# Patient Record
Sex: Male | Born: 1950 | Race: White | Hispanic: No | Marital: Married | Smoking: Current every day smoker
Health system: Southern US, Community
[De-identification: ages and names within clinical notes are randomized; demographics above are authoritative.]

## PROBLEM LIST (undated history)

## (undated) DIAGNOSIS — G8929 Other chronic pain: Secondary | ICD-10-CM

## (undated) DIAGNOSIS — I739 Peripheral vascular disease, unspecified: Secondary | ICD-10-CM

## (undated) DIAGNOSIS — G629 Polyneuropathy, unspecified: Secondary | ICD-10-CM

## (undated) DIAGNOSIS — E782 Mixed hyperlipidemia: Secondary | ICD-10-CM

## (undated) DIAGNOSIS — I420 Dilated cardiomyopathy: Secondary | ICD-10-CM

## (undated) DIAGNOSIS — N184 Chronic kidney disease, stage 4 (severe): Secondary | ICD-10-CM

## (undated) DIAGNOSIS — I2510 Atherosclerotic heart disease of native coronary artery without angina pectoris: Secondary | ICD-10-CM

## (undated) DIAGNOSIS — I5030 Unspecified diastolic (congestive) heart failure: Secondary | ICD-10-CM

## (undated) DIAGNOSIS — Z7729 Contact with and (suspected ) exposure to other hazardous substances: Secondary | ICD-10-CM

## (undated) DIAGNOSIS — R29898 Other symptoms and signs involving the musculoskeletal system: Secondary | ICD-10-CM

## (undated) DIAGNOSIS — K8020 Calculus of gallbladder without cholecystitis without obstruction: Secondary | ICD-10-CM

## (undated) DIAGNOSIS — I509 Heart failure, unspecified: Secondary | ICD-10-CM

## (undated) DIAGNOSIS — M51369 Other intervertebral disc degeneration, lumbar region without mention of lumbar back pain or lower extremity pain: Secondary | ICD-10-CM

## (undated) DIAGNOSIS — M1990 Unspecified osteoarthritis, unspecified site: Secondary | ICD-10-CM

## (undated) DIAGNOSIS — E785 Hyperlipidemia, unspecified: Secondary | ICD-10-CM

## (undated) DIAGNOSIS — K269 Duodenal ulcer, unspecified as acute or chronic, without hemorrhage or perforation: Secondary | ICD-10-CM

## (undated) DIAGNOSIS — M549 Dorsalgia, unspecified: Secondary | ICD-10-CM

## (undated) DIAGNOSIS — I272 Pulmonary hypertension, unspecified: Secondary | ICD-10-CM

## (undated) DIAGNOSIS — Z7901 Long term (current) use of anticoagulants: Secondary | ICD-10-CM

## (undated) DIAGNOSIS — T8859XA Other complications of anesthesia, initial encounter: Secondary | ICD-10-CM

## (undated) DIAGNOSIS — Z972 Presence of dental prosthetic device (complete) (partial): Secondary | ICD-10-CM

## (undated) DIAGNOSIS — N189 Chronic kidney disease, unspecified: Secondary | ICD-10-CM

## (undated) DIAGNOSIS — K219 Gastro-esophageal reflux disease without esophagitis: Secondary | ICD-10-CM

## (undated) DIAGNOSIS — I6789 Other cerebrovascular disease: Secondary | ICD-10-CM

## (undated) DIAGNOSIS — J449 Chronic obstructive pulmonary disease, unspecified: Secondary | ICD-10-CM

## (undated) DIAGNOSIS — M5136 Other intervertebral disc degeneration, lumbar region: Secondary | ICD-10-CM

## (undated) DIAGNOSIS — I10 Essential (primary) hypertension: Secondary | ICD-10-CM

## (undated) DIAGNOSIS — M519 Unspecified thoracic, thoracolumbar and lumbosacral intervertebral disc disorder: Secondary | ICD-10-CM

## (undated) DIAGNOSIS — I700 Atherosclerosis of aorta: Secondary | ICD-10-CM

## (undated) DIAGNOSIS — N186 End stage renal disease: Secondary | ICD-10-CM

## (undated) DIAGNOSIS — I639 Cerebral infarction, unspecified: Secondary | ICD-10-CM

## (undated) DIAGNOSIS — I6523 Occlusion and stenosis of bilateral carotid arteries: Secondary | ICD-10-CM

## (undated) DIAGNOSIS — K08109 Complete loss of teeth, unspecified cause, unspecified class: Secondary | ICD-10-CM

## (undated) HISTORY — DX: Atherosclerotic heart disease of native coronary artery without angina pectoris: I25.10

## (undated) HISTORY — DX: Heart failure, unspecified: I50.9

## (undated) HISTORY — DX: Chronic kidney disease, unspecified: N18.9

---

## 1985-03-18 HISTORY — PX: CERVICAL DISC SURGERY: SHX588

## 2003-03-19 HISTORY — PX: BACK SURGERY: SHX140

## 2003-07-19 ENCOUNTER — Ambulatory Visit (HOSPITAL_COMMUNITY): Admission: RE | Admit: 2003-07-19 | Discharge: 2003-07-20 | Payer: Self-pay | Admitting: Neurosurgery

## 2004-03-18 HISTORY — PX: LUMBAR LAMINECTOMY: SHX95

## 2009-03-23 ENCOUNTER — Encounter: Admission: RE | Admit: 2009-03-23 | Discharge: 2009-03-23 | Payer: Self-pay | Admitting: Neurosurgery

## 2009-04-06 ENCOUNTER — Encounter: Admission: RE | Admit: 2009-04-06 | Discharge: 2009-04-06 | Payer: Self-pay | Admitting: Neurosurgery

## 2009-10-26 ENCOUNTER — Encounter: Admission: RE | Admit: 2009-10-26 | Discharge: 2009-10-26 | Payer: Self-pay | Admitting: Neurosurgery

## 2010-06-05 ENCOUNTER — Other Ambulatory Visit: Payer: Self-pay | Admitting: Neurosurgery

## 2010-06-05 DIAGNOSIS — M5410 Radiculopathy, site unspecified: Secondary | ICD-10-CM

## 2010-06-05 DIAGNOSIS — M479 Spondylosis, unspecified: Secondary | ICD-10-CM

## 2010-06-06 ENCOUNTER — Ambulatory Visit
Admission: RE | Admit: 2010-06-06 | Discharge: 2010-06-06 | Disposition: A | Discharge status: Home / Self Care | Payer: Managed Care, Other (non HMO) | Source: Ambulatory Visit | Attending: Neurosurgery | Admitting: Neurosurgery

## 2010-06-06 DIAGNOSIS — M5410 Radiculopathy, site unspecified: Secondary | ICD-10-CM

## 2010-06-06 DIAGNOSIS — M479 Spondylosis, unspecified: Secondary | ICD-10-CM

## 2010-07-12 ENCOUNTER — Ambulatory Visit: Payer: Self-pay | Admitting: Internal Medicine

## 2010-08-03 NOTE — H&P (Signed)
NAME:  Gary Mccoy, Gary Mccoy                       ACCOUNT NO.:  [id number]   MEDICAL RECORD NO.:  [id number]                   PATIENT TYPE:  OIB   LOCATION:  NA                                   FACILITY:  [facility]   PHYSICIAN:  Leeroy Cha, M.D.                DATE OF BIRTH:  Mar 15, 1951   DATE OF ADMISSION:  07/19/2003  DATE OF DISCHARGE:                                HISTORY & PHYSICAL   HISTORY:  The patient is a gentleman who was seen by me in my office about  one week ago because of back pain with radiation down to the left leg, which  had been going on for seven months, which has been getting worse.  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  Also, walking is associated with a  cramp in the left leg.  He walks up to 200 feet, and then he has to stop  because he developed a cramping pain in the left leg which was unbearable.  He denies any problem with the right leg.  He is really worried, because  according to him, his profession requires a lot of skills, and now he feels  that it is getting dangerous for him to drive.  He had an MRI and was sent  to [place] for evaluation.   PAST MEDICAL/SURGICAL HISTORY:  Cervical diskectomy in 1987.   SOCIAL HISTORY:  He smokes two packs a day.  He drinks socially.   FAMILY HISTORY:  Unremarkable.   REVIEW OF SYSTEMS:  Positive for back and left leg pain.   MEDICATIONS:  Relafen.   PHYSICAL EXAMINATION:  GENERAL:  The patient came to my office limping from  the left leg.  The pain is unbearable.  NECK:  Normal.  LUNGS:  Clear.  HEART:  Sounds are normal.  ABDOMEN:  Normal.  EXTREMITIES:  Normal pulses.  NEUROLOGIC:  He has a scar in the posterior cervical area.  Mental status is  normal.  Cranial nerves  normal.  Strength is normal except for weakness of  dorsiflexion in the left foot at  3/5.  His face is symmetrical.  Straight  leg raising is positive bilaterally at 80 degrees.   The lumbar spine x-rays show positive arthropathy.  The MRI showed that he  has stenosis at the level of L4-5.  There is quite a bit of lateral recess  bilaterally, left worse than right one.  The canal at the level of L4-5 is  about 8 mm.   IMPRESSION:  Left L5 radiculopathy, secondary to stenosis.   RECOMMENDATIONS:  I talked to the patient at length.  We talked about  proceeding with the left side, but he is really concerned now that  because  of the right side being narrow, he is really worried having to have surgery  again.  At the end, we agreed to go ahead and do an L4-5 laminotomy and  foraminotomy on the left side, and for the same  procedure on the right side.  The patient knows about the risks, such as  infection, CSF leak, worsening of the pain, paralysis, need for further  surgery, no improvement whatsoever, or need for further surgical procedure,  which might require a fusion.                                                Leeroy Cha, M.D.    EB/MEDQ  D:  07/19/2003  T:  07/19/2003  Job:  [id number]

## 2010-08-03 NOTE — Op Note (Signed)
NAME:  Gary Mccoy, Gary Mccoy                       ACCOUNT NO.:  [id number]   MEDICAL RECORD NO.:  [id number]                   PATIENT TYPE:  OIB   LOCATION:  3172                                 FACILITY:  [facility]   PHYSICIAN:  Leeroy Cha, M.D.                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   POSTOPERATIVE DIAGNOSIS:  Lumbar stenosis with chronic left L5  radiculopathy.   PROCEDURE:  Bilateral L4-L5 laminotomy, bilateral foraminotomy,  decompression of the L4-L5 nerve root, microscope.   SURGEON:  Leeroy Cha, M.D.   ASSISTANT:  Otilio Connors, M.D.   CLINICAL HISTORY:  The patient was admitted because of back and left leg  pain.  The pain is worse with activity and cramp in the left leg.  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  He does not have  any pain in the right leg.  The MRI shows severe L4-L5 stenosis up to the  point that the canal is about 8 mm.  He has bilateral foraminal narrowing  with compromise of L5 nerve root, although he is only symptomatic on the  left side.  He did not want anymore conservative treatment and he wanted to  go ahead with surgery.  He was worried about the possibility of having the  symptoms on the right side and because of the size, we decided to proceed  bilaterally.  The risks were explained during the history and physical.   PROCEDURE:  The patient was taken to the OR and he was positioned in a prone  manner.  The back was prepped with Betadine.  A midline incision from L4 to  L5 was made.  The muscles were retracted laterally.  X-ray showed that,  indeed, we were at the level of L4-L5.  We started on the left side and with  the drill, we drilled away the lamina of 5-4, the upper of L5 and the thick  ligament.  Indeed, the canal was  quite narrow.  We went laterally removing  1/3 of the medial facet.  He has hypertrophy of the yellow ligament and all  of this was excised.  Finally, we were able to retract the L5 nerve root.  The patient had quite a bit of adhesions, lysing was accomplished.  Using  the straight as well as the curved, we did the foraminotomy.  The same  procedure was done similar on the right side with decompression of the right  L5 nerve root.  We investigated the disc space and it was slightly bulging  but there was no need to do any discectomy.  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  During the  procedure, the patient started coughing and we could see a small amount of  CSF coming through.  Because of that, we put a single stitch followed by  Tisseel.  Valsalva maneuver was negative.  The area was irrigated.  Fat was  left in the epidural space and the wound was closed with Vicryl and nylon.                                               Leeroy Cha, M.D.    EB/MEDQ  D:  07/19/2003  T:  07/19/2003  Job:  [id number]

## 2010-08-08 ENCOUNTER — Ambulatory Visit: Payer: Self-pay | Admitting: Neurology

## 2012-12-14 ENCOUNTER — Ambulatory Visit: Payer: Self-pay | Admitting: Gastroenterology

## 2012-12-14 HISTORY — PX: COLONOSCOPY: SHX174

## 2013-02-01 DIAGNOSIS — I10 Essential (primary) hypertension: Secondary | ICD-10-CM | POA: Insufficient documentation

## 2013-11-12 DIAGNOSIS — M5414 Radiculopathy, thoracic region: Secondary | ICD-10-CM | POA: Insufficient documentation

## 2013-11-12 DIAGNOSIS — M5137 Other intervertebral disc degeneration, lumbosacral region: Secondary | ICD-10-CM | POA: Insufficient documentation

## 2013-11-12 DIAGNOSIS — M5116 Intervertebral disc disorders with radiculopathy, lumbar region: Secondary | ICD-10-CM | POA: Insufficient documentation

## 2013-11-12 DIAGNOSIS — M5136 Other intervertebral disc degeneration, lumbar region: Secondary | ICD-10-CM | POA: Insufficient documentation

## 2014-01-27 DIAGNOSIS — Z7729 Contact with and (suspected ) exposure to other hazardous substances: Secondary | ICD-10-CM | POA: Insufficient documentation

## 2014-03-26 ENCOUNTER — Ambulatory Visit: Payer: Self-pay | Admitting: Physical Medicine and Rehabilitation

## 2015-08-28 ENCOUNTER — Emergency Department: Payer: Managed Care, Other (non HMO)

## 2015-08-28 ENCOUNTER — Emergency Department
Admission: EM | Admit: 2015-08-28 | Discharge: 2015-08-28 | Disposition: A | Discharge status: Home / Self Care | Payer: Managed Care, Other (non HMO) | Attending: Emergency Medicine | Admitting: Emergency Medicine

## 2015-08-28 DIAGNOSIS — R1011 Right upper quadrant pain: Secondary | ICD-10-CM

## 2015-08-28 DIAGNOSIS — M791 Myalgia: Secondary | ICD-10-CM | POA: Diagnosis not present

## 2015-08-28 DIAGNOSIS — F17210 Nicotine dependence, cigarettes, uncomplicated: Secondary | ICD-10-CM | POA: Insufficient documentation

## 2015-08-28 DIAGNOSIS — K269 Duodenal ulcer, unspecified as acute or chronic, without hemorrhage or perforation: Secondary | ICD-10-CM | POA: Insufficient documentation

## 2015-08-28 DIAGNOSIS — M7918 Myalgia, other site: Secondary | ICD-10-CM

## 2015-08-28 DIAGNOSIS — I7409 Other arterial embolism and thrombosis of abdominal aorta: Secondary | ICD-10-CM

## 2015-08-28 LAB — COMPREHENSIVE METABOLIC PANEL
ALK PHOS: 90 U/L (ref 38–126)
ALT: 38 U/L (ref 17–63)
AST: 30 U/L (ref 15–41)
Albumin: 4.1 g/dL (ref 3.5–5.0)
Anion gap: 9 (ref 5–15)
BUN: 17 mg/dL (ref 6–20)
CALCIUM: 9.6 mg/dL (ref 8.9–10.3)
CO2: 25 mmol/L (ref 22–32)
CREATININE: 1.21 mg/dL (ref 0.61–1.24)
Chloride: 104 mmol/L (ref 101–111)
Glucose, Bld: 82 mg/dL (ref 65–99)
Potassium: 3.9 mmol/L (ref 3.5–5.1)
Sodium: 138 mmol/L (ref 135–145)
Total Bilirubin: 1 mg/dL (ref 0.3–1.2)
Total Protein: 7.5 g/dL (ref 6.5–8.1)

## 2015-08-28 LAB — URINALYSIS COMPLETE WITH MICROSCOPIC (ARMC ONLY)
BILIRUBIN URINE: NEGATIVE
Glucose, UA: NEGATIVE mg/dL
Hgb urine dipstick: NEGATIVE
KETONES UR: NEGATIVE mg/dL
LEUKOCYTES UA: NEGATIVE
Nitrite: NEGATIVE
PH: 5 (ref 5.0–8.0)
PROTEIN: NEGATIVE mg/dL
SQUAMOUS EPITHELIAL / LPF: NONE SEEN
Specific Gravity, Urine: 1.017 (ref 1.005–1.030)

## 2015-08-28 LAB — CBC
HCT: 47 % (ref 40.0–52.0)
Hemoglobin: 15.7 g/dL (ref 13.0–18.0)
MCH: 29.5 pg (ref 26.0–34.0)
MCHC: 33.4 g/dL (ref 32.0–36.0)
MCV: 88.3 fL (ref 80.0–100.0)
PLATELETS: 305 10*3/uL (ref 150–440)
RBC: 5.32 MIL/uL (ref 4.40–5.90)
RDW: 14.1 % (ref 11.5–14.5)
WBC: 9.1 10*3/uL (ref 3.8–10.6)

## 2015-08-28 LAB — LIPASE, BLOOD: Lipase: 29 U/L (ref 11–51)

## 2015-08-28 MED ORDER — OXYCODONE-ACETAMINOPHEN 5-325 MG PO TABS: 1.0000 | ORAL_TABLET | ORAL | Status: DC | PRN

## 2015-08-28 MED ORDER — DIATRIZOATE MEGLUMINE & SODIUM 66-10 % PO SOLN
15.0000 mL | Freq: Once | ORAL | Status: AC
  Administered 2015-08-28: 15 mL via ORAL

## 2015-08-28 MED ORDER — OXYCODONE-ACETAMINOPHEN 5-325 MG PO TABS
2.0000 | ORAL_TABLET | Freq: Once | ORAL | Status: AC
  Administered 2015-08-28: 2 via ORAL
  Filled 2015-08-28: qty 2

## 2015-08-28 MED ORDER — SUCRALFATE 1 G PO TABS: 1.0000 g | ORAL_TABLET | Freq: Four times a day (QID) | ORAL | Status: DC | PRN

## 2015-08-28 MED ORDER — IOPAMIDOL (ISOVUE-300) INJECTION 61%
100.0000 mL | Freq: Once | INTRAVENOUS | Status: AC | PRN
  Administered 2015-08-28: 100 mL via INTRAVENOUS

## 2015-08-28 MED ORDER — SODIUM CHLORIDE 0.9 % IV BOLUS (SEPSIS)
500.0000 mL | INTRAVENOUS | Status: AC
  Administered 2015-08-28: 500 mL via INTRAVENOUS

## 2015-08-28 MED ORDER — ONDANSETRON HCL 4 MG/2ML IJ SOLN
4.0000 mg | INTRAMUSCULAR | Status: AC
  Administered 2015-08-28: 4 mg via INTRAVENOUS
  Filled 2015-08-28: qty 2

## 2015-08-28 MED ORDER — OMEPRAZOLE MAGNESIUM 20 MG PO TBEC: 20.0000 mg | DELAYED_RELEASE_TABLET | Freq: Every day | ORAL | Status: DC

## 2015-08-28 MED ORDER — MORPHINE SULFATE (PF) 4 MG/ML IV SOLN
4.0000 mg | Freq: Once | INTRAVENOUS | Status: AC
  Administered 2015-08-28: 4 mg via INTRAVENOUS
  Filled 2015-08-28: qty 1

## 2015-08-28 MED ORDER — DOCUSATE SODIUM 100 MG PO CAPS: ORAL_CAPSULE | ORAL | Status: DC

## 2015-08-28 NOTE — ED Notes (Signed)
Pt refuses pain medication at this time.

## 2015-08-28 NOTE — ED Notes (Signed)
Patient discharge and follow up information reviewed with patient by ED nursing staff and patient given the opportunity to ask questions pertaining to ED visit and discharge plan of care. Patient advised that should symptoms not continue to improve, resolve entirely, or should new symptoms develop then a follow up visit with their PCP or a return visit to the ED may be warranted. Patient verbalized consent and understanding of discharge plan of care including potential need for further evaluation. Patient being discharged in stable condition per attending ED physician on duty.

## 2015-08-28 NOTE — ED Notes (Signed)
Pt comes from [facility] with c/o RUQ pain with N/V that radiates around to the back, worse with movement to deep breathing, worse with episodes of intense pain lasting 3-6hrs.. Pt is in NAD on arrival.

## 2015-08-28 NOTE — ED Notes (Signed)
Patient transported to Ultrasound 

## 2015-08-28 NOTE — ED Notes (Signed)
Patient transported to CT 

## 2015-08-28 NOTE — ED Notes (Signed)
Pt in via triage with complaints of RUQ abdominal pain x 3-4 days with some N/V/D.  Pt denies any vomiting in the last 24 hours.  Pt reports he ate a slice of pizza today and began hurting shortly after.  Pt A/Ox4, vitals WDL, no immediate distress at this time.

## 2015-08-28 NOTE — ED Provider Notes (Signed)
[facility] Emergency Department Provider Note  ____________________________________________  Time seen: Approximately 7:01 PM  I have reviewed the triage vital signs and the nursing notes.   HISTORY  Chief Complaint Abdominal Pain    HPI Gary Mccoy. is a 65 y.o. male who is quite healthy at baseline and in spite of some back surgeries who presents with acute onset right upper quadrant abdominal pain that is been present for about 3 or 4 days with some nausea, vomiting, and diarrhea.  He has not had any vomiting for about 24 hours but did have several episodes yesterday.  He reports that originally the pain started after just eating a salad and it eventually resolved.  Today he had a slice of pizza and within about 30 minutes developed the pain.  He describes the pain as severe, sharp, stabbing, and radiating through to his right shoulder blade.  It is also radiating down into his right lower quadrant.  He denies fever/chills, chest pain, shortness of breath, dysuria.       History reviewed. No pertinent past medical history.  There are no active problems to display for this patient.   Past Surgical History  Procedure Laterality Date  . Back surgery    . Cervical disc surgery      Current Outpatient Rx  Name  Route  Sig  Dispense  Refill  . docusate sodium (COLACE) 100 MG capsule      Take 1 tablet once or twice daily as needed for constipation while taking narcotic pain medicine   30 capsule   0   . omeprazole (PRILOSEC OTC) 20 MG tablet   Oral   Take 1 tablet (20 mg total) by mouth daily.   28 tablet   1   . oxyCODONE-acetaminophen (ROXICET) 5-325 MG tablet   Oral   Take 1-2 tablets by mouth every 4 (four) hours as needed for severe pain.   20 tablet   0   . sucralfate (CARAFATE) 1 g tablet   Oral   Take 1 tablet (1 g total) by mouth 4 (four) times daily as needed (for abdominal discomfort, nausea, and/or vomiting).   30  tablet   1     Allergies Review of patient's allergies indicates no known allergies.  No family history on file.  Social History Social History  Substance Use Topics  . Smoking status: Current Every Day Smoker -- 2.00 packs/day    Types: Cigarettes  . Smokeless tobacco: None  . Alcohol Use: No    Review of Systems Constitutional: No fever/chills Eyes: No visual changes. ENT: No sore throat. Cardiovascular: Denies chest pain. Respiratory: Denies shortness of breath. Gastrointestinal: +abdominal pain.  +nausea/vomiting/diarrhea.  No constipation. Genitourinary: Negative for dysuria. Musculoskeletal: Pain radiating from the abdomen to the back Skin: Negative for rash. Neurological: Negative for headaches, focal weakness or numbness.  10-point ROS otherwise negative.  ____________________________________________   PHYSICAL EXAM:  VITAL SIGNS: ED Triage Vitals  Enc Vitals Group     BP 08/28/15 1604 128/80 mmHg     Pulse Rate 08/28/15 1604 83     Resp 08/28/15 1604 18     Temp 08/28/15 1604 97.9 F (36.6 C)     Temp Source 08/28/15 1604 Oral     SpO2 08/28/15 1604 100 %     Weight 08/28/15 1604 195 lb (88.451 kg)     Height 08/28/15 1604 _0  (1.854 m)     Head Cir --  Peak Flow --      Pain Score 08/28/15 1605 3     Pain Loc --      Pain Edu? --      Excl. in [place]? --     Constitutional: Alert and oriented. Well appearing and in no acute distress. Eyes: Conjunctivae are normal. PERRL. EOMI. Head: Atraumatic. Nose: No congestion/rhinnorhea. Mouth/Throat: Mucous membranes are moist.  Oropharynx non-erythematous. Neck: No stridor.  No meningeal signs.   Cardiovascular: Normal rate, regular rhythm. Good peripheral circulation. Grossly normal heart sounds.   Respiratory: Normal respiratory effort.  No retractions. Lungs CTAB. Gastrointestinal: Soft.  Tender to palpation of the right upper quadrant as well as the right lower quadrant with guarding and mild  rebound tenderness. Musculoskeletal: No lower extremity tenderness nor edema. No gross deformities of extremities. Neurologic:  Normal speech and language. No gross focal neurologic deficits are appreciated.  Skin:  Skin is warm, dry and intact. No rash noted. Psychiatric: Mood and affect are normal. Speech and behavior are normal.  ____________________________________________   LABS (all labs ordered are listed, but only abnormal results are displayed)  Labs Reviewed  URINALYSIS COMPLETEWITH MICROSCOPIC ([place] ONLY) - Abnormal; Notable for the following:    Color, Urine YELLOW (*)    APPearance CLEAR (*)    Bacteria, UA RARE (*)    All other components within normal limits  LIPASE, BLOOD  COMPREHENSIVE METABOLIC PANEL  CBC   ____________________________________________  EKG  ED ECG REPORT I, Fanny Agan, the attending physician, personally viewed and interpreted this ECG.  Date: 08/28/2015 EKG Time: 19:32 Rate: 75 Rhythm: normal sinus rhythm QRS Axis: normal Intervals: normal ST/T Wave abnormalities: normal Conduction Disturbances: none Narrative Interpretation: unremarkable  ____________________________________________  RADIOLOGY   US Abdomen Complete  08/28/2015  CLINICAL DATA:  Right upper quadrant pain and epigastric pain radiating to the back. EXAM: ABDOMEN ULTRASOUND COMPLETE COMPARISON:  None. FINDINGS: Gallbladder: No gallstones or wall thickening visualized. No sonographic Murphy sign noted by sonographer. Common bile duct: Diameter: 5.1 mm Liver: Mild increased parenchymal echogenicity. Small lesion in the right lobe measuring 6 x 5 x 8 mm. This is hypoechoic, possibly a cyst, but not fully characterized. IVC: No abnormality visualized. Pancreas: Visualized portion unremarkable. Spleen: Size and appearance within normal limits. Right Kidney: Length: 10.8 cm. Echogenicity within normal limits. No mass or hydronephrosis visualized. Left Kidney: Length: 11.3 cm.  Echogenicity within normal limits. No mass or hydronephrosis visualized. Abdominal aorta: No aneurysm visualized. Other findings: None. IMPRESSION: 1. No acute findings. No findings to account for this patient's symptoms. Normal gallbladder with no bile duct dilation. 2. Mild diffuse hepatic steatosis. Probable small right lobe cyst measuring 8 mm. 3. No other abnormalities. Electronically Signed   By: Lajean Manes M.D.   On: 08/28/2015 19:10   Ct Abdomen Pelvis W Contrast  08/28/2015  CLINICAL DATA:  65 year old with right upper quadrant pain. Nausea and vomiting. Current smoker. EXAM: CT ABDOMEN AND PELVIS WITH CONTRAST TECHNIQUE: Multidetector CT imaging of the abdomen and pelvis was performed using the standard protocol following bolus administration of intravenous contrast. CONTRAST:  14m ISOVUE-300 IOPAMIDOL (ISOVUE-300) INJECTION 61% COMPARISON:  Ultrasound 08/28/2015 FINDINGS: Lower chest: Punctate linear density in the right middle lobe on sequence 4, image 3 is nonspecific. Otherwise, the lung bases are clear. No pleural effusions. Hepatobiliary: Punctate low-density structure in the right hepatic lobe on sequence 2, image 25 is too small to definitively characterize but could represent a small cyst. Otherwise, normal appearance of the  liver and gallbladder. No biliary dilatation. Main portal venous system is patent. Pancreas: Normal appearance of the pancreas without inflammation or duct dilatation. Spleen: Normal appearance of spleen without enlargement. Adrenals/Urinary Tract: Normal adrenal glands. Normal appearance of both kidneys and urinary bladder. No hydronephrosis. There is a small calcification in the left kidney interpolar region but unclear if this represents a a nonobstructive stone or vascular calcification. Stomach/Bowel: Normal appearance of the stomach. There is possibly an ulceration involving the medial aspect of the proximal duodenum seen on sequence 2, image 27 and sequence 6,  image 9. Subtle haziness in the fat surrounding this area. Normal appearance of small bowel without obstruction. Normal appearance of the appendix. Vascular/Lymphatic: No suspicious lymphadenopathy in the abdomen or pelvis. There is extensive atherosclerotic disease involving the infrarenal abdominal aorta with circumferential atherosclerotic plaque. The infrarenal abdominal aorta measures up to 2.3 cm. 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 The left external iliac artery is atretic and there is reconstitution of the left common femoral artery. Right common iliac artery and right external iliac artery appear to be patent but there is a near occlusion or high-grade stenosis in the right common femoral artery. There is flow in the proximal right femoral arteries. Limited evaluation for thrombus within the iliac veins due to mixing of blood and contrast. The celiac trunk and SMA are patent but there is plaque in the proximal SMA. Main bilateral renal arteries are patent but poorly characterized on this examination. Reproductive: No gross abnormality to the prostate or seminal vesicles. Other: No free fluid.  No free air. Musculoskeletal: No acute bone abnormality. IMPRESSION: Possible duodenal ulcer along the medial aspect of the proximal duodenum. Subtle haziness in the adjacent fat. Recommend clinical correlation in this area and better characterized with endoscopy. Extensive atherosclerotic disease involving the aorta and iliac arteries. 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 In addition, there appears to be severe stenosis in the right common femoral artery. These vascular findings could be associated with claudication. There is a punctate 3 mm nodule in the right middle lobe which is nonspecific. Reportedly, the patient is a current smoker. Recommend follow-up based on Fleischner criteria: Non-contrast chest CT can  be considered in 12 months if patient is high-risk. This recommendation follows the consensus statement: Guidelines for Management of Incidental Pulmonary Nodules Detected on CT Images:From the Fleischner Society 2017; published online before print (10.1148/radiol.7076151834). Electronically Signed   By: Markus Daft M.D.   On: 08/28/2015 20:33    ____________________________________________   PROCEDURES  Procedure(s) performed: None  Critical Care performed: No ____________________________________________   INITIAL IMPRESSION / ASSESSMENT AND PLAN / ED COURSE  Pertinent labs & imaging results that were available during my care of the patient were reviewed by me and considered in my medical decision making (see chart for details).  The signs and symptoms are all consistent with gallbladder disease, but the ultrasound that I ordered initially upon reading the triage note was unremarkable.  Therefore I am proceeding with a CT scan of his abdomen and pelvis given the tenderness to palpation on exam although his labs are all reassuring and his vital signs are stable.  He is agreeable to this plan and is refusing pain medicine at this time.  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 We had a lengthy discussion  And he explained in great detail that he has been having these episodes of pain in his chest wall wrapping around from the front to the back as well as in his back "from top to bottom" for several weeks.  He is  extremely frustrated that Dr. Sharlet Salina aand his primary care doctor have not been able to find what is wrong, aand he describes it as feeling like there is a problem with the muscles  All over but he does not understand why it is migrating from place to place.  We had a long talk about it and I explained that I am not finding an emergent medical condition to explain his symptoms. He states that he understands. tthere is no evidence of aneurysm nor dissection from what we can visualize of his aorta. His labs are reassuring and I suspect that his duodenal ulcer is the cause of his right upper quadrant and epigastric pain but I do not have a good explanation for his musculoskeletal pain.. I give him a dose of morphine and Percocet as well as a prescription and encouraged him to follow up as an outpatient. I did my usual and customary return precautions.  I prescribed over-the-counter Prilosec and Carafate  For his abdominal pain and probable duodenal ulcer and encourage close follow-up with his PCP as well as with G.I. He understands and agrees with the plan. ____________________________________________  FINAL CLINICAL IMPRESSION(S) / ED DIAGNOSES  Final diagnoses:  RUQ abdominal pain  Duodenal ulcer  Musculoskeletal pain     MEDICATIONS GIVEN DURING THIS VISIT:  Medications  morphine 4 MG/ML injection 4 mg (not administered)  oxyCODONE-acetaminophen (PERCOCET/ROXICET) 5-325 MG per tablet 2 tablet (not administered)  ondansetron (ZOFRAN) injection 4 mg (4 mg Intravenous Given 08/28/15 1941)  sodium chloride 0.9 % bolus 500 mL (500 mLs Intravenous New Bag/Given 08/28/15 1934)  diatrizoate meglumine-sodium (GASTROGRAFIN) 66-10 % solution 15 mL (15 mLs Oral Given 08/28/15 1931)  iopamidol (ISOVUE-300) 61 % injection 100 mL (100 mLs Intravenous Contrast Given 08/28/15 1955)     NEW OUTPATIENT MEDICATIONS STARTED DURING THIS VISIT:  New Prescriptions   DOCUSATE SODIUM (COLACE) 100 MG CAPSULE    Take  1 tablet once or twice daily as needed for constipation while taking narcotic pain medicine   OMEPRAZOLE (PRILOSEC OTC) 20 MG TABLET    Take 1 tablet (20 mg total) by mouth daily.   OXYCODONE-ACETAMINOPHEN (ROXICET) 5-325 MG TABLET    Take 1-2 tablets by mouth every 4 (four) hours as needed for severe pain.   SUCRALFATE (CARAFATE) 1 G TABLET    Take 1 tablet (1 g total) by mouth 4 (four) times daily as needed (for abdominal discomfort, nausea, and/or vomiting).      Note:  This document was prepared using Dragon voice recognition software and may include unintentional dictation errors.   Hinda Kehr, MD 08/28/15 2321

## 2015-08-28 NOTE — Discharge Instructions (Signed)
As we discussed, your workup was generally reassuring.  Your CT scan of the abdomen suggests that you may have a duodenal ulcer.  We recommend that you take over-the-counter Prilosec as well as the prescribed sucralfate and follow-up with either your primary care doctor or a GI doctor, such as Dr. Allen Norris, at the next available opportunity to discuss additional management.  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  We recommend that you follow up with Dr. Lucky Cowboy, a vascular surgeon, to discuss how to proceed with further management.  Additionally, you have a small nodule in the right middle lobe of your lung.  Given your smoker history, the radiologists recommend that you have a repeat non-contrast chest CT in about 12 months.  Please discuss this with your regular doctor.  We are uncertain about the cause of your scheduled skeletal pain that seems to migrate around your chest wall in your back.  It does not appear to be an emergent medical condition but we understand how this can affect your life and livelihood.  We recommend that you take the prescribed pain medication for severe pain and Tylenol for mild to moderate pain.  Do not take Tylenol together with the prescribed pain medication because Tylenol is contained within the Percocet.  Take Percocet as prescribed for severe pain. Do not drink alcohol, drive or participate in any other potentially dangerous activities while taking this medication as it may make you sleepy. Do not take this medication with any other sedating medications, either prescription or over-the-counter. If you were prescribed Percocet or Vicodin, do not take these with acetaminophen (Tylenol) as it is already contained within these medications.   This medication is an opiate (or narcotic) pain medication and can be habit forming.  Use it as little as possible  to achieve adequate pain control.  Do not use or use it with extreme caution if you have a history of opiate abuse or dependence.  If you are on a pain contract with your primary care doctor or a pain specialist, be sure to let them know you were prescribed this medication today from the [facility].  This medication is intended for your use only - do not give any to anyone else and keep it in a secure place where nobody else, especially children, have access to it.  It will also cause or worsen constipation, so you may want to consider taking an over-the-counter stool softener while you are taking this medication.   Abdominal Pain, Adult Many things can cause abdominal pain. Usually, abdominal pain is not caused by a disease and will improve without treatment. It can often be observed and treated at home. Your health care provider will do a physical exam and possibly order blood tests and X-rays to help determine the seriousness of your pain. However, in many cases, more time must pass before a clear cause of the pain can be found. Before that point, your health care provider may not know if you need more testing or further treatment. HOME CARE INSTRUCTIONS Monitor your abdominal pain for any changes. The following actions may help to alleviate any discomfort you are experiencing:  Only take over-the-counter or prescription medicines as directed by your health care provider.  Do not take laxatives unless directed to do so by your health care provider.  Try a clear liquid diet (broth, tea,  or water) as directed by your health care provider. Slowly move to a bland diet as tolerated. SEEK MEDICAL CARE IF:  You have unexplained abdominal pain.  You have abdominal pain associated with nausea or diarrhea.  You have pain when you urinate or have a bowel movement.  You experience abdominal pain that wakes you in the night.  You have abdominal pain that is worsened or  improved by eating food.  You have abdominal pain that is worsened with eating fatty foods.  You have a fever. SEEK IMMEDIATE MEDICAL CARE IF:  Your pain does not go away within 2 hours.  You keep throwing up (vomiting).  Your pain is felt only in portions of the abdomen, such as the right side or the left lower portion of the abdomen.  You pass bloody or black tarry stools. MAKE SURE YOU:  Understand these instructions.  Will watch your condition.  Will get help right away if you are not doing well or get worse.   This information is not intended to replace advice given to you by your health care provider. Make sure you discuss any questions you have with your health care provider.   Document Released: 12/12/2004 Document Revised: 11/23/2014 Document Reviewed: 11/11/2012 Elsevier Interactive Patient Education 2016 Elsevier Inc.  Peptic Ulcer A peptic ulcer is a sore in the lining of your esophagus (esophageal ulcer), stomach (gastric ulcer), or in the first part of your small intestine (duodenal ulcer). The ulcer causes erosion into the deeper tissue. CAUSES  Normally, the lining of the stomach and the small intestine protects itself from the acid that digests food. The protective lining can be damaged by:  An infection caused by a bacterium called Helicobacter pylori (H. pylori).  Regular use of nonsteroidal anti-inflammatory drugs (NSAIDs), such as ibuprofen or aspirin.  Smoking tobacco. Other risk factors include being older than 70, drinking alcohol excessively, and having a family history of ulcer disease.  SYMPTOMS   Burning pain or gnawing in the area between the chest and the belly button.  Heartburn.  Nausea and vomiting.  Bloating. The pain can be worse on an empty stomach and at night. If the ulcer results in bleeding, it can cause:  Black, tarry stools.  Vomiting of bright red blood.  Vomiting of coffee-ground-looking materials. DIAGNOSIS  A  diagnosis is usually made based upon your history and an exam. Other tests and procedures may be performed to find the cause of the ulcer. Finding a cause will help determine the best treatment. Tests and procedures may include:  Blood tests, stool tests, or breath tests to check for the bacterium H. pylori.  An upper gastrointestinal (GI) series of the esophagus, stomach, and small intestine.  An endoscopy to examine the esophagus, stomach, and small intestine.  A biopsy. TREATMENT  Treatment may include:  Eliminating the cause of the ulcer, such as smoking, NSAIDs, or alcohol.  Medicines to reduce the amount of acid in your digestive tract.  Antibiotic medicines if the ulcer is caused by the H. pylori bacterium.  An upper endoscopy to treat a bleeding ulcer.  Surgery if the bleeding is severe or if the ulcer created a hole somewhere in the digestive system. HOME CARE INSTRUCTIONS   Avoid tobacco, alcohol, and caffeine. Smoking can increase the acid in the stomach, and continued smoking will impair the healing of ulcers.  Avoid foods and drinks that seem to cause discomfort or aggravate your ulcer.  Only take medicines as directed by your  caregiver. Do not substitute over-the-counter medicines for prescription medicines without talking to your caregiver.  Keep any follow-up appointments and tests as directed. SEEK MEDICAL CARE IF:   Your do not improve within 7 days of starting treatment.  You have ongoing indigestion or heartburn. SEEK IMMEDIATE MEDICAL CARE IF:   You have sudden, sharp, or persistent abdominal pain.  You have bloody or dark black, tarry stools.  You vomit blood or vomit that looks like coffee grounds.  You become light-headed, weak, or feel faint.  You become sweaty or clammy. MAKE SURE YOU:   Understand these instructions.  Will watch your condition.  Will get help right away if you are not doing well or get worse.   This information is not  intended to replace advice given to you by your health care provider. Make sure you discuss any questions you have with your health care provider.   Document Released: 03/01/2000 Document Revised: 03/25/2014 Document Reviewed: 10/02/2011 Elsevier Interactive Patient Education 2016 Elsevier Inc.  Musculoskeletal Pain Musculoskeletal pain is muscle and boney aches and pains. These pains can occur in any part of the body. Your caregiver may treat you without knowing the cause of the pain. They may treat you if blood or urine tests, X-rays, and other tests were normal.  CAUSES There is often not a definite cause or reason for these pains. These pains may be caused by a type of germ (virus). The discomfort may also come from overuse. Overuse includes working out too hard when your body is not fit. Boney aches also come from weather changes. Bone is sensitive to atmospheric pressure changes. HOME CARE INSTRUCTIONS   Ask when your test results will be ready. Make sure you get your test results.  Only take over-the-counter or prescription medicines for pain, discomfort, or fever as directed by your caregiver. If you were given medications for your condition, do not drive, operate machinery or power tools, or sign legal documents for 24 hours. Do not drink alcohol. Do not take sleeping pills or other medications that may interfere with treatment.  Continue all activities unless the activities cause more pain. When the pain lessens, slowly resume normal activities. Gradually increase the intensity and duration of the activities or exercise.  During periods of severe pain, bed rest may be helpful. Lay or sit in any position that is comfortable.  Putting ice on the injured area.  Put ice in a bag.  Place a towel between your skin and the bag.  Leave the ice on for 15 to 20 minutes, 3 to 4 times a day.  Follow up with your caregiver for continued problems and no reason can be found for the pain. If  the pain becomes worse or does not go away, it may be necessary to repeat tests or do additional testing. Your caregiver may need to look further for a possible cause. SEEK IMMEDIATE MEDICAL CARE IF:  You have pain that is getting worse and is not relieved by medications.  You develop chest pain that is associated with shortness or breath, sweating, feeling sick to your stomach (nauseous), or throw up (vomit).  Your pain becomes localized to the abdomen.  You develop any new symptoms that seem different or that concern you. MAKE SURE YOU:   Understand these instructions.  Will watch your condition.  Will get help right away if you are not doing well or get worse.   This information is not intended to replace advice given to you  by your health care provider. Make sure you discuss any questions you have with your health care provider.   Document Released: 03/04/2005 Document Revised: 05/27/2011 Document Reviewed: 11/06/2012 Elsevier Interactive Patient Education [employer].

## 2015-08-29 ENCOUNTER — Telehealth: Payer: Self-pay | Admitting: Gastroenterology

## 2015-08-29 ENCOUNTER — Other Ambulatory Visit: Payer: Self-pay

## 2015-08-29 ENCOUNTER — Encounter: Payer: Self-pay | Admitting: *Deleted

## 2015-08-29 DIAGNOSIS — Z7729 Contact with and (suspected ) exposure to other hazardous substances: Secondary | ICD-10-CM | POA: Insufficient documentation

## 2015-08-29 NOTE — Telephone Encounter (Signed)
Pt scheduled for EGD at [facility] on 09/04/15. Duodenal ulcer K27.3. Please precert.

## 2015-08-29 NOTE — Telephone Encounter (Signed)
Patient was seen in the ED yesterday and needs an appointment for a EGD

## 2015-08-31 NOTE — Discharge Instructions (Signed)
General Anesthesia, Adult, Care After Refer to this sheet in the next few weeks. These instructions provide you with information on caring for yourself after your procedure. Your health care provider may also give you more specific instructions. Your treatment has been planned according to current medical practices, but problems sometimes occur. Call your health care provider if you have any problems or questions after your procedure. WHAT TO EXPECT AFTER THE PROCEDURE After the procedure, it is typical to experience:  Sleepiness.  Nausea and vomiting. HOME CARE INSTRUCTIONS  For the first 24 hours after general anesthesia:  Have a responsible person with you.  Do not drive a car. If you are alone, do not take public transportation.  Do not drink alcohol.  Do not take medicine that has not been prescribed by your health care provider.  Do not sign important papers or make important decisions.  You may resume a normal diet and activities as directed by your health care provider.  Change bandages (dressings) as directed.  If you have questions or problems that seem related to general anesthesia, call the hospital and ask for the anesthetist or anesthesiologist on call. SEEK MEDICAL CARE IF:  You have nausea and vomiting that continue the day after anesthesia.  You develop a rash. SEEK IMMEDIATE MEDICAL CARE IF:   You have difficulty breathing.  You have chest pain.  You have any allergic problems.   This information is not intended to replace advice given to you by your health care provider. Make sure you discuss any questions you have with your health care provider.   Document Released: 06/10/2000 Document Revised: 03/25/2014 Document Reviewed: 07/03/2011 Elsevier Interactive Patient Education [employer].

## 2015-09-04 ENCOUNTER — Ambulatory Visit: Payer: Managed Care, Other (non HMO) | Admitting: Anesthesiology

## 2015-09-04 ENCOUNTER — Encounter
Admission: RE | Disposition: A | Discharge status: Home / Self Care | Payer: Self-pay | Source: Ambulatory Visit | Attending: Gastroenterology

## 2015-09-04 ENCOUNTER — Ambulatory Visit
Admission: RE | Admit: 2015-09-04 | Discharge: 2015-09-04 | Disposition: A | Discharge status: Home / Self Care | Payer: Managed Care, Other (non HMO) | Source: Ambulatory Visit | Attending: Gastroenterology | Admitting: Gastroenterology

## 2015-09-04 DIAGNOSIS — R933 Abnormal findings on diagnostic imaging of other parts of digestive tract: Secondary | ICD-10-CM | POA: Diagnosis present

## 2015-09-04 DIAGNOSIS — M6281 Muscle weakness (generalized): Secondary | ICD-10-CM | POA: Diagnosis not present

## 2015-09-04 DIAGNOSIS — K269 Duodenal ulcer, unspecified as acute or chronic, without hemorrhage or perforation: Secondary | ICD-10-CM | POA: Insufficient documentation

## 2015-09-04 DIAGNOSIS — Z79899 Other long term (current) drug therapy: Secondary | ICD-10-CM | POA: Insufficient documentation

## 2015-09-04 DIAGNOSIS — K2950 Unspecified chronic gastritis without bleeding: Secondary | ICD-10-CM | POA: Diagnosis not present

## 2015-09-04 DIAGNOSIS — F17210 Nicotine dependence, cigarettes, uncomplicated: Secondary | ICD-10-CM | POA: Diagnosis not present

## 2015-09-04 DIAGNOSIS — K2970 Gastritis, unspecified, without bleeding: Secondary | ICD-10-CM | POA: Diagnosis not present

## 2015-09-04 DIAGNOSIS — M1990 Unspecified osteoarthritis, unspecified site: Secondary | ICD-10-CM | POA: Diagnosis not present

## 2015-09-04 DIAGNOSIS — K219 Gastro-esophageal reflux disease without esophagitis: Secondary | ICD-10-CM | POA: Insufficient documentation

## 2015-09-04 DIAGNOSIS — R198 Other specified symptoms and signs involving the digestive system and abdomen: Secondary | ICD-10-CM | POA: Insufficient documentation

## 2015-09-04 HISTORY — DX: Unspecified osteoarthritis, unspecified site: M19.90

## 2015-09-04 HISTORY — PX: ESOPHAGOGASTRODUODENOSCOPY (EGD) WITH PROPOFOL: SHX5813

## 2015-09-04 HISTORY — DX: Other symptoms and signs involving the musculoskeletal system: R29.898

## 2015-09-04 HISTORY — DX: Presence of dental prosthetic device (complete) (partial): Z97.2

## 2015-09-04 SURGERY — ESOPHAGOGASTRODUODENOSCOPY (EGD) WITH PROPOFOL
Anesthesia: Monitor Anesthesia Care | Wound class: Clean Contaminated

## 2015-09-04 MED ORDER — PROPOFOL 10 MG/ML IV BOLUS
INTRAVENOUS | Status: DC | PRN
  Administered 2015-09-04 (×3): 50 mg via INTRAVENOUS

## 2015-09-04 MED ORDER — LIDOCAINE HCL (CARDIAC) 20 MG/ML IV SOLN
INTRAVENOUS | Status: DC | PRN
  Administered 2015-09-04: 40 mg via INTRAVENOUS

## 2015-09-04 MED ORDER — OXYCODONE HCL 5 MG/5ML PO SOLN: 5.0000 mg | Freq: Once | ORAL | Status: DC | PRN

## 2015-09-04 MED ORDER — STERILE WATER FOR IRRIGATION IR SOLN
Status: DC | PRN
  Administered 2015-09-04: 12:00:00

## 2015-09-04 MED ORDER — LACTATED RINGERS IV SOLN
INTRAVENOUS | Status: DC
  Administered 2015-09-04: 11:00:00 via INTRAVENOUS

## 2015-09-04 MED ORDER — OXYCODONE HCL 5 MG PO TABS: 5.0000 mg | ORAL_TABLET | Freq: Once | ORAL | Status: DC | PRN

## 2015-09-04 MED ORDER — GLYCOPYRROLATE 0.2 MG/ML IJ SOLN
INTRAMUSCULAR | Status: DC | PRN
  Administered 2015-09-04: 0.2 mg via INTRAVENOUS

## 2015-09-04 SURGICAL SUPPLY — 31 items
BALLN DILATOR 10-12 8 (BALLOONS)
BALLN DILATOR 12-15 8 (BALLOONS)
BALLN DILATOR 15-18 8 (BALLOONS)
BALLN DILATOR CRE 0-12 8 (BALLOONS)
BALLN DILATOR ESOPH 8 10 CRE (MISCELLANEOUS) IMPLANT
BALLOON DILATOR 12-15 8 (BALLOONS) IMPLANT
BALLOON DILATOR 15-18 8 (BALLOONS) IMPLANT
BALLOON DILATOR CRE 0-12 8 (BALLOONS) IMPLANT
BLOCK BITE 60FR ADLT L/F GRN (MISCELLANEOUS) ×3 IMPLANT
CANISTER SUCT 1200ML W/VALVE (MISCELLANEOUS) ×3 IMPLANT
CLIP HMST 235XBRD CATH ROT (MISCELLANEOUS) IMPLANT
CLIP RESOLUTION 360 11X235 (MISCELLANEOUS)
FCP ESCP3.2XJMB 240X2.8X (MISCELLANEOUS)
FORCEPS BIOP RAD 4 LRG CAP 4 (CUTTING FORCEPS) ×3 IMPLANT
FORCEPS BIOP RJ4 240 W/NDL (MISCELLANEOUS)
FORCEPS ESCP3.2XJMB 240X2.8X (MISCELLANEOUS) IMPLANT
GOWN CVR UNV OPN BCK APRN NK (MISCELLANEOUS) ×2 IMPLANT
GOWN ISOL THUMB LOOP REG UNIV (MISCELLANEOUS) ×4
INJECTOR VARIJECT VIN23 (MISCELLANEOUS) IMPLANT
KIT DEFENDO VALVE AND CONN (KITS) IMPLANT
KIT ENDO PROCEDURE OLY (KITS) ×3 IMPLANT
MARKER SPOT ENDO TATTOO 5ML (MISCELLANEOUS) IMPLANT
PAD GROUND ADULT SPLIT (MISCELLANEOUS) IMPLANT
SNARE SHORT THROW 13M SML OVAL (MISCELLANEOUS) IMPLANT
SNARE SHORT THROW 30M LRG OVAL (MISCELLANEOUS) IMPLANT
SPOT EX ENDOSCOPIC TATTOO (MISCELLANEOUS)
SYR INFLATION 60ML (SYRINGE) IMPLANT
TRAP ETRAP POLY (MISCELLANEOUS) IMPLANT
VARIJECT INJECTOR VIN23 (MISCELLANEOUS)
WATER STERILE IRR 250ML POUR (IV SOLUTION) ×3 IMPLANT
WIRE CRE 18-20MM 8CM F G (MISCELLANEOUS) IMPLANT

## 2015-09-04 NOTE — Anesthesia Preprocedure Evaluation (Signed)
Anesthesia Evaluation  Patient identified by MRN, date of birth, ID band  Reviewed: NPO status   History of Anesthesia Complications Negative for: history of anesthetic complications  Airway Mallampati: II  TM Distance: >3 FB Neck ROM: full    Dental  (+) Upper Dentures, Lower Dentures, Edentulous Upper, Edentulous Lower   Pulmonary Current Smoker,    Pulmonary exam normal        Cardiovascular Exercise Tolerance: Good Normal cardiovascular exam     Neuro/Psych negative neurological ROS  negative psych ROS   GI/Hepatic Neg liver ROS, GERD  Controlled,  Endo/Other  negative endocrine ROS  Renal/GU negative Renal ROS  negative genitourinary   Musculoskeletal  (+) Arthritis , LBPain;  Posterior CERVICAL DISC SURGERY;    Abdominal   Peds  Hematology negative hematology ROS (+)   Anesthesia Other Findings Ekg: NSR:    Reproductive/Obstetrics                             Anesthesia Physical Anesthesia Plan  ASA: II  Anesthesia Plan: MAC   Post-op Pain Management:    Induction:   Airway Management Planned:   Additional Equipment:   Intra-op Plan:   Post-operative Plan:   Informed Consent: I have reviewed the patients History and Physical, chart, labs and discussed the procedure including the risks, benefits and alternatives for the proposed anesthesia with the patient or authorized representative who has indicated his/her understanding and acceptance.     Plan Discussed with: CRNA  Anesthesia Plan Comments:         Anesthesia Quick Evaluation

## 2015-09-04 NOTE — H&P (Signed)
  Lucilla Lame, MD [address] [place], [place] [address] Phone: [phone] Fax : [phone]  Primary Care Physician:  Rusty Aus, MD Primary Gastroenterologist:  Dr. Allen Norris  Pre-Procedure History & Physical: HPI:  Gary Mccoy. is a 65 y.o. male is here for an endoscopy.   Past Medical History  Diagnosis Date  . Wears dentures     full upper and lower  . Leg weakness, bilateral     s/p lumbar surgery  . Arthritis     lower back    Past Surgical History  Procedure Laterality Date  . Cervical disc surgery  1987  . Back surgery  2005    Prior to Admission medications   Medication Sig Start Date End Date Taking? Authorizing Provider  docusate sodium (COLACE) 100 MG capsule Take 1 tablet once or twice daily as needed for constipation while taking narcotic pain medicine 08/28/15  Yes Hinda Kehr, MD  meloxicam (MOBIC) 15 MG tablet Take 15 mg by mouth daily.   Yes Historical Provider, MD  Multiple Vitamins-Minerals (MULTIVITAMIN ADULT PO) Take by mouth.   Yes Historical Provider, MD  omeprazole (PRILOSEC OTC) 20 MG tablet Take 1 tablet (20 mg total) by mouth daily. 08/28/15 08/27/16 Yes Hinda Kehr, MD  oxyCODONE-acetaminophen (ROXICET) 5-325 MG tablet Take 1-2 tablets by mouth every 4 (four) hours as needed for severe pain. 08/28/15  Yes Hinda Kehr, MD  sucralfate (CARAFATE) 1 g tablet Take 1 tablet (1 g total) by mouth 4 (four) times daily as needed (for abdominal discomfort, nausea, and/or vomiting). 08/28/15  Yes Hinda Kehr, MD    Allergies as of 08/29/2015  . (No Known Allergies)    History reviewed. No pertinent family history.  Social History   Social History  . Marital Status: Married    Spouse Name: N/A  . Number of Children: N/A  . Years of Education: N/A   Occupational History  . Not on file.   Social History Main Topics  . Smoking status: Current Every Day Smoker -- 2.00 packs/day for 45 years    Types: Cigarettes  .  Smokeless tobacco: Not on file  . Alcohol Use: No  . Drug Use: No  . Sexual Activity: Not on file   Other Topics Concern  . Not on file   Social History Narrative    Review of Systems: See HPI, otherwise negative ROS  Physical Exam: BP 138/76 mmHg  Pulse 57  Temp(Src) 98.1 F (36.7 C) (Tympanic)  Resp 18  Ht _0  (1.854 m)  Wt 189 lb (85.73 kg)  BMI 24.94 kg/m2  SpO2 99% General:   Alert,  pleasant and cooperative in NAD Head:  Normocephalic and atraumatic. Neck:  Supple; no masses or thyromegaly. Lungs:  Clear throughout to auscultation.    Heart:  Regular rate and rhythm. Abdomen:  Soft, nontender and nondistended. Normal bowel sounds, without guarding, and without rebound.   Neurologic:  Alert and  oriented x4;  grossly normal neurologically.  Impression/Plan: Gary A Phil Dopp. is here for an endoscopy to be performed for abnormal CT  Risks, benefits, limitations, and alternatives regarding  endoscopy have been reviewed with the patient.  Questions have been answered.  All parties agreeable.   Lucilla Lame, MD  09/04/2015, 10:47 AM

## 2015-09-04 NOTE — Op Note (Addendum)
[facility] Patient Name: Gary Mccoy Procedure Date: 09/04/2015 11:21 AM MRN: [id number] Account #: [id number] Date of Birth: [birth year] Admit Type: Outpatient Age: 65 Room: [facility] OR ROOM 01 Gender: Male Note Status: Finalized Procedure:            Upper GI endoscopy Indications:          Abnormal CT of the GI tract Providers:            Lucilla Lame, MD Referring MD:         Rusty Aus, MD (Referring MD) Medicines:            Propofol per Anesthesia Complications:        No immediate complications. Procedure:            Pre-Anesthesia Assessment:                       - Prior to the procedure, a History and Physical was                        performed, and patient medications and allergies were                        reviewed. [name] tolerance of previous                        anesthesia was also reviewed. The risks and benefits of                        the procedure and the sedation options and risks were                        discussed with the patient. All questions were                        answered, and informed consent was obtained. Prior                        Anticoagulants: The patient has taken no previous                        anticoagulant or antiplatelet agents. ASA Grade                        Assessment: II - A patient with mild systemic disease.                        After reviewing the risks and benefits, the patient was                        deemed in satisfactory condition to undergo the                        procedure.                       After obtaining informed consent, the endoscope was                        passed under direct vision. Throughout the procedure,  the patient's blood pressure, pulse, and oxygen                        saturations were monitored continuously. The Olympus                        [id number]-HQ190 Endoscope (S#. [id number]) was introduced     through the mouth, and advanced to the second part of                        duodenum. The upper GI endoscopy was accomplished                        without difficulty. The patient tolerated the procedure                        well. Findings:      The examined esophagus was normal.      Localized mild inflammation characterized by erosions was found in the       gastric antrum. Biopsies were taken with a cold forceps for histology.      One non-bleeding cratered duodenal ulcer with no stigmata of bleeding       was found in the first portion of the duodenum. The lesion was 15 mm in       largest dimension. Impression:           - Normal esophagus.                       - Gastritis. Biopsied.                       - One non-bleeding duodenal ulcer with no stigmata of                        bleeding. Recommendation:       - Avoid NSAID's Procedure Code(s):    --- Professional ---                       [phone], Esophagogastroduodenoscopy, flexible, transoral;                        with biopsy, single or multiple Diagnosis Code(s):    --- Professional ---                       R93.3, Abnormal findings on diagnostic imaging of other                        parts of digestive tract                       K29.70, Gastritis, unspecified, without bleeding                       K26.9, Duodenal ulcer, unspecified as acute or chronic,                        without hemorrhage or perforation CPT copyright 2016 American Medical Association. All rights reserved. The codes documented in this report are preliminary and upon coder review may  be revised to meet current compliance requirements. Lucilla Lame, MD 09/04/2015 11:40:18  AM This report has been signed electronically. Number of Addenda: 0 Note Initiated On: 09/04/2015 11:21 AM Total Procedure Duration: 0 hours 4 minutes 20 seconds       [facility]

## 2015-09-04 NOTE — Anesthesia Postprocedure Evaluation (Signed)
Anesthesia Post Note  Patient: Gary Mccoy.  Procedure(s) Performed: Procedure(s) (LRB): ESOPHAGOGASTRODUODENOSCOPY (EGD) WITH PROPOFOL (N/A)  Patient location during evaluation: PACU Anesthesia Type: MAC Level of consciousness: awake and alert Pain management: pain level controlled Vital Signs Assessment: post-procedure vital signs reviewed and stable Respiratory status: spontaneous breathing, nonlabored ventilation, respiratory function stable and patient connected to nasal cannula oxygen Cardiovascular status: stable and blood pressure returned to baseline Anesthetic complications: no    Zalyn Amend

## 2015-09-04 NOTE — Transfer of Care (Signed)
Immediate Anesthesia Transfer of Care Note  Patient: Gary Mccoy.  Procedure(s) Performed: Procedure(s): ESOPHAGOGASTRODUODENOSCOPY (EGD) WITH PROPOFOL (N/A)  Patient Location: PACU  Anesthesia Type: MAC  Level of Consciousness: awake, alert  and patient cooperative  Airway and Oxygen Therapy: Patient Spontanous Breathing and Patient connected to supplemental oxygen  Post-op Assessment: Post-op Vital signs reviewed, Patient's Cardiovascular Status Stable, Respiratory Function Stable, Patent Airway and No signs of Nausea or vomiting  Post-op Vital Signs: Reviewed and stable  Complications: No apparent anesthesia complications

## 2015-09-05 ENCOUNTER — Encounter: Payer: Self-pay | Admitting: Gastroenterology

## 2015-09-06 ENCOUNTER — Encounter: Payer: Self-pay | Admitting: Gastroenterology

## 2015-09-07 ENCOUNTER — Encounter: Payer: Self-pay | Admitting: Gastroenterology

## 2015-09-19 ENCOUNTER — Emergency Department: Payer: Managed Care, Other (non HMO)

## 2015-09-19 ENCOUNTER — Inpatient Hospital Stay
Admission: EM | Admit: 2015-09-19 | Discharge: 2015-09-22 | DRG: 065 | Disposition: A | Discharge status: Home / Self Care | Payer: Managed Care, Other (non HMO) | Attending: Internal Medicine | Admitting: Internal Medicine

## 2015-09-19 ENCOUNTER — Encounter: Payer: Self-pay | Admitting: Emergency Medicine

## 2015-09-19 DIAGNOSIS — I6522 Occlusion and stenosis of left carotid artery: Secondary | ICD-10-CM

## 2015-09-19 DIAGNOSIS — G8929 Other chronic pain: Secondary | ICD-10-CM | POA: Diagnosis present

## 2015-09-19 DIAGNOSIS — G8311 Monoplegia of lower limb affecting right dominant side: Secondary | ICD-10-CM | POA: Diagnosis present

## 2015-09-19 DIAGNOSIS — G839 Paralytic syndrome, unspecified: Secondary | ICD-10-CM | POA: Diagnosis not present

## 2015-09-19 DIAGNOSIS — K279 Peptic ulcer, site unspecified, unspecified as acute or chronic, without hemorrhage or perforation: Secondary | ICD-10-CM | POA: Diagnosis present

## 2015-09-19 DIAGNOSIS — I70203 Unspecified atherosclerosis of native arteries of extremities, bilateral legs: Secondary | ICD-10-CM | POA: Diagnosis present

## 2015-09-19 DIAGNOSIS — I639 Cerebral infarction, unspecified: Secondary | ICD-10-CM | POA: Diagnosis present

## 2015-09-19 DIAGNOSIS — I700 Atherosclerosis of aorta: Secondary | ICD-10-CM | POA: Diagnosis present

## 2015-09-19 DIAGNOSIS — D72829 Elevated white blood cell count, unspecified: Secondary | ICD-10-CM

## 2015-09-19 DIAGNOSIS — I6340 Cerebral infarction due to embolism of unspecified cerebral artery: Secondary | ICD-10-CM | POA: Diagnosis present

## 2015-09-19 DIAGNOSIS — K219 Gastro-esophageal reflux disease without esophagitis: Secondary | ICD-10-CM | POA: Diagnosis present

## 2015-09-19 DIAGNOSIS — I7419 Embolism and thrombosis of other parts of aorta: Secondary | ICD-10-CM | POA: Diagnosis present

## 2015-09-19 DIAGNOSIS — I4891 Unspecified atrial fibrillation: Secondary | ICD-10-CM | POA: Diagnosis present

## 2015-09-19 DIAGNOSIS — R531 Weakness: Secondary | ICD-10-CM | POA: Diagnosis not present

## 2015-09-19 DIAGNOSIS — M21379 Foot drop, unspecified foot: Secondary | ICD-10-CM | POA: Diagnosis present

## 2015-09-19 DIAGNOSIS — F17210 Nicotine dependence, cigarettes, uncomplicated: Secondary | ICD-10-CM | POA: Diagnosis present

## 2015-09-19 DIAGNOSIS — M5126 Other intervertebral disc displacement, lumbar region: Secondary | ICD-10-CM | POA: Diagnosis present

## 2015-09-19 DIAGNOSIS — I998 Other disorder of circulatory system: Secondary | ICD-10-CM | POA: Diagnosis present

## 2015-09-19 DIAGNOSIS — I739 Peripheral vascular disease, unspecified: Secondary | ICD-10-CM

## 2015-09-19 DIAGNOSIS — Z716 Tobacco abuse counseling: Secondary | ICD-10-CM

## 2015-09-19 DIAGNOSIS — R29898 Other symptoms and signs involving the musculoskeletal system: Secondary | ICD-10-CM | POA: Diagnosis not present

## 2015-09-19 HISTORY — DX: Gastro-esophageal reflux disease without esophagitis: K21.9

## 2015-09-19 LAB — CK: CK TOTAL: 313 U/L (ref 49–397)

## 2015-09-19 LAB — COMPREHENSIVE METABOLIC PANEL
ALK PHOS: 68 U/L (ref 38–126)
ALT: 27 U/L (ref 17–63)
AST: 31 U/L (ref 15–41)
Albumin: 4.1 g/dL (ref 3.5–5.0)
Anion gap: 8 (ref 5–15)
BILIRUBIN TOTAL: 1.2 mg/dL (ref 0.3–1.2)
BUN: 11 mg/dL (ref 6–20)
CO2: 23 mmol/L (ref 22–32)
CREATININE: 1.03 mg/dL (ref 0.61–1.24)
Calcium: 9.1 mg/dL (ref 8.9–10.3)
Chloride: 108 mmol/L (ref 101–111)
GFR calc non Af Amer: >60 mL/min (ref >60)
Glucose, Bld: 110 mg/dL — ABNORMAL HIGH (ref 65–99)
Potassium: 3.8 mmol/L (ref 3.5–5.1)
SODIUM: 139 mmol/L (ref 135–145)
TOTAL PROTEIN: 7 g/dL (ref 6.5–8.1)

## 2015-09-19 LAB — URINALYSIS COMPLETE WITH MICROSCOPIC (ARMC ONLY)
BILIRUBIN URINE: NEGATIVE
Glucose, UA: NEGATIVE mg/dL
Hgb urine dipstick: NEGATIVE
KETONES UR: NEGATIVE mg/dL
Leukocytes, UA: NEGATIVE
NITRITE: NEGATIVE
Protein, ur: NEGATIVE mg/dL
Specific Gravity, Urine: 1.013 (ref 1.005–1.030)
pH: 5 (ref 5.0–8.0)

## 2015-09-19 LAB — TSH: TSH: 1.219 u[IU]/mL (ref 0.350–4.500)

## 2015-09-19 LAB — CBC WITH DIFFERENTIAL/PLATELET
Basophils Absolute: 0.1 10*3/uL (ref 0–0.1)
Basophils Relative: 1 %
Eosinophils Absolute: 0.1 10*3/uL (ref 0–0.7)
Eosinophils Relative: 1 %
HCT: 43.1 % (ref 40.0–52.0)
Hemoglobin: 15.2 g/dL (ref 13.0–18.0)
Lymphocytes Relative: 14 %
Lymphs Abs: 1.9 10*3/uL (ref 1.0–3.6)
MCH: 31 pg (ref 26.0–34.0)
MCHC: 35.4 g/dL (ref 32.0–36.0)
MCV: 87.6 fL (ref 80.0–100.0)
Monocytes Absolute: 0.8 10*3/uL (ref 0.2–1.0)
Monocytes Relative: 6 %
Neutro Abs: 10.3 10*3/uL — ABNORMAL HIGH (ref 1.4–6.5)
Neutrophils Relative %: 78 %
Platelets: 194 10*3/uL (ref 150–440)
RBC: 4.91 MIL/uL (ref 4.40–5.90)
RDW: 14.8 % — ABNORMAL HIGH (ref 11.5–14.5)
WBC: 13.2 10*3/uL — ABNORMAL HIGH (ref 3.8–10.6)

## 2015-09-19 LAB — APTT: aPTT: 29 s (ref 24–36)

## 2015-09-19 LAB — PROTIME-INR
INR: 1.08
Prothrombin Time: 14.2 seconds (ref 11.4–15.0)

## 2015-09-19 LAB — HEPARIN LEVEL (UNFRACTIONATED): Heparin Unfractionated: <0.1 [IU]/mL — ABNORMAL LOW (ref 0.30–0.70)

## 2015-09-19 MED ORDER — PANTOPRAZOLE SODIUM 40 MG PO TBEC
40.0000 mg | DELAYED_RELEASE_TABLET | Freq: Every day | ORAL | Status: DC
  Administered 2015-09-19 – 2015-09-21 (×3): 40 mg via ORAL
  Filled 2015-09-19 (×3): qty 1

## 2015-09-19 MED ORDER — ONDANSETRON HCL 4 MG/2ML IJ SOLN: 4.0000 mg | Freq: Four times a day (QID) | INTRAMUSCULAR | Status: DC | PRN

## 2015-09-19 MED ORDER — ATORVASTATIN CALCIUM 20 MG PO TABS
40.0000 mg | ORAL_TABLET | Freq: Every day | ORAL | Status: DC
  Administered 2015-09-19 – 2015-09-21 (×3): 40 mg via ORAL
  Filled 2015-09-19 (×3): qty 2

## 2015-09-19 MED ORDER — DOCUSATE SODIUM 100 MG PO CAPS
200.0000 mg | ORAL_CAPSULE | Freq: Two times a day (BID) | ORAL | Status: DC
  Filled 2015-09-19 (×3): qty 2

## 2015-09-19 MED ORDER — ALBUTEROL SULFATE HFA 108 (90 BASE) MCG/ACT IN AERS: 1.0000 | INHALATION_SPRAY | RESPIRATORY_TRACT | Status: DC

## 2015-09-19 MED ORDER — OXYCODONE-ACETAMINOPHEN 5-325 MG PO TABS: 1.0000 | ORAL_TABLET | ORAL | Status: DC | PRN

## 2015-09-19 MED ORDER — SODIUM CHLORIDE 0.9% FLUSH: 3.0000 mL | Freq: Two times a day (BID) | INTRAVENOUS | Status: DC

## 2015-09-19 MED ORDER — PNEUMOCOCCAL VAC POLYVALENT 25 MCG/0.5ML IJ INJ
0.5000 mL | INJECTION | INTRAMUSCULAR | Status: AC
  Administered 2015-09-20: 0.5 mL via INTRAMUSCULAR
  Filled 2015-09-19: qty 0.5

## 2015-09-19 MED ORDER — ACETAMINOPHEN 325 MG PO TABS: 650.0000 mg | ORAL_TABLET | Freq: Four times a day (QID) | ORAL | Status: DC | PRN

## 2015-09-19 MED ORDER — ALBUTEROL SULFATE (2.5 MG/3ML) 0.083% IN NEBU: 2.5000 mg | INHALATION_SOLUTION | Freq: Four times a day (QID) | RESPIRATORY_TRACT | Status: DC | PRN

## 2015-09-19 MED ORDER — POTASSIUM CHLORIDE IN NACL 20-0.9 MEQ/L-% IV SOLN
INTRAVENOUS | Status: DC
  Administered 2015-09-19 – 2015-09-22 (×4): via INTRAVENOUS
  Filled 2015-09-19 (×5): qty 1000

## 2015-09-19 MED ORDER — IOPAMIDOL (ISOVUE-370) INJECTION 76%
125.0000 mL | Freq: Once | INTRAVENOUS | Status: AC | PRN
  Administered 2015-09-19: 125 mL via INTRAVENOUS

## 2015-09-19 MED ORDER — ACETAMINOPHEN 650 MG RE SUPP: 650.0000 mg | Freq: Four times a day (QID) | RECTAL | Status: DC | PRN

## 2015-09-19 MED ORDER — NICOTINE 21 MG/24HR TD PT24
21.0000 mg | MEDICATED_PATCH | Freq: Every day | TRANSDERMAL | Status: DC
  Administered 2015-09-19 – 2015-09-22 (×4): 21 mg via TRANSDERMAL
  Filled 2015-09-19 (×4): qty 1

## 2015-09-19 MED ORDER — HEPARIN (PORCINE) IN NACL 100-0.45 UNIT/ML-% IJ SOLN
1400.0000 [IU]/h | INTRAMUSCULAR | Status: DC
  Administered 2015-09-19 – 2015-09-22 (×4): 1400 [IU]/h via INTRAVENOUS
  Filled 2015-09-19 (×8): qty 250

## 2015-09-19 MED ORDER — SUCRALFATE 1 G PO TABS: 1.0000 g | ORAL_TABLET | Freq: Four times a day (QID) | ORAL | Status: DC | PRN

## 2015-09-19 MED ORDER — ONDANSETRON HCL 4 MG PO TABS: 4.0000 mg | ORAL_TABLET | Freq: Four times a day (QID) | ORAL | Status: DC | PRN

## 2015-09-19 MED ORDER — ALBUTEROL SULFATE (2.5 MG/3ML) 0.083% IN NEBU
2.5000 mg | INHALATION_SOLUTION | RESPIRATORY_TRACT | Status: DC
  Administered 2015-09-19: 21:00:00 2.5 mg via RESPIRATORY_TRACT
  Filled 2015-09-19: qty 3

## 2015-09-19 MED ORDER — ASPIRIN EC 325 MG PO TBEC
325.0000 mg | DELAYED_RELEASE_TABLET | Freq: Every day | ORAL | Status: DC
  Administered 2015-09-19 – 2015-09-21 (×3): 325 mg via ORAL
  Filled 2015-09-19 (×3): qty 1

## 2015-09-19 NOTE — ED Notes (Signed)
Pt presents with numbness to right leg. States that he cannot feel his lower leg at all. Pt states it started as a cramp in his leg, and then he could no longer control his leg or feel it. Pt denies other health problems other than back pain. Pt was taking meloxicam, recently changed to celebrex. Pt had difficulty standing. NAD noted.

## 2015-09-19 NOTE — ED Notes (Signed)
MD at bedside.

## 2015-09-19 NOTE — Progress Notes (Signed)
ANTICOAGULATION CONSULT NOTE - Initial Consult  Pharmacy Consult for heparin Indication: limb ischemia  No Known Allergies  Patient Measurements: Height: _0  (185.4 cm) Weight: 195 lb (88.451 kg) IBW/kg (Calculated) : 79.9 Heparin Dosing Weight: 88 kg  Vital Signs: Temp: 97.5 F (36.4 C) (07/04 1218) Temp Source: Oral (07/04 1218) BP: 154/75 mmHg (07/04 1414) Pulse Rate: 69 (07/04 1414)  Labs:  Recent Labs  09/19/15 1233  HGB 15.2  HCT 43.1  PLT 194  APTT 29  LABPROT 14.2  INR 1.08  CREATININE 1.03  CKTOTAL 313   Estimated Creatinine Clearance: 81.9 mL/min (by C-G formula based on Cr of 1.03).  Medical History: Past Medical History  Diagnosis Date  . Wears dentures     full upper and lower  . Leg weakness, bilateral     s/p lumbar surgery  . Arthritis     lower back   Assessment: Pharmacy consulted to dose and monitor heparin drip in this 65 year old male for limb ischemia. Patient was not taking anticoagulants prior to admission.   Baseline labs:  Hgb 15.2 Plt 194  INR 1.08 APTT 29  Goal of Therapy:  Heparin level 0.3-0.7 units/ml Monitor platelets by anticoagulation protocol: Yes   Plan:  No bolus per MD Start heparin infusion at 1400 units/hr Check anti-Xa level in 6 hours and daily while on heparin Continue to monitor H&H and platelets  Lenis Noon, PharmD Clinical Pharmacist 09/19/2015,3:25 PM

## 2015-09-19 NOTE — H&P (Addendum)
[facility] NAME: Gary Mccoy    MR#:  [id number]  DATE OF BIRTH:  10-21-1950  DATE OF ADMISSION:  09/19/2015  PRIMARY CARE PHYSICIAN: Rusty Aus, MD   REQUESTING/REFERRING PHYSICIAN:   CHIEF COMPLAINT:   Chief Complaint  Patient presents with  . Numbness    HISTORY OF PRESENT ILLNESS: Gary Mccoy  is a 65 y.o. male with a known history of Fasciitis, low back pain, bilateral lower extremity weakness, peripheral vascular disease, who presents to the hospital with complaints of right lower extremity numbness and weakness. When the patient, he was doing well up until approximately a week ago when he started noticing right lower extremity numbness from the knee down to the foot. It got worse over the period of week and since yesterday the patient is not able to move his right lower extremity below the knee, cannot walk due to weakness. He presented to the hospital for further evaluation and treatment. Due to concerns of peripheral vascular disease and acutely ischemic leg, patient underwent CT angiogram of aorta and bifemoral area which revealed chronic aortic atherosclerosis, right infrapopliteal disease with patency of proximal AT PT trunk, proximal peroneal artery, proximal PT maintained. He posterior tibialis appeared patent to the ankle, augh there was absece of contrast at the distal right anterior tibialis and peroneal artery, which could represent either contrast issues or alternatively,  Occlusion. Hospitalist services were contacted for admission. EKG revealed A. fib, rate of 63, event is concerning for thromboembolism.  He denies any symptoms in right upper extremity, face, visual symptoms of swallowing problems, no speech problems as well.  PAST MEDICAL HISTORY:   Past Medical History  Diagnosis Date  . Wears dentures     full upper and lower  . Leg weakness, bilateral     s/p lumbar surgery  . Arthritis      lower back    PAST SURGICAL HISTORY: Past Surgical History  Procedure Laterality Date  . Cervical disc surgery  1987  . Back surgery  2005  . Esophagogastroduodenoscopy (egd) with propofol N/A 09/04/2015    Procedure: ESOPHAGOGASTRODUODENOSCOPY (EGD) WITH PROPOFOL;  Surgeon: Lucilla Lame, MD;  Location: [facility];  Service: Endoscopy;  Laterality: N/A;    SOCIAL HISTORY:  Social History  Substance Use Topics  . Smoking status: Current Every Day Smoker -- 2.00 packs/day for 45 years    Types: Cigarettes  . Smokeless tobacco: Not on file  . Alcohol Use: No     Comment: occasional    FAMILY HISTORY: .No early coronary artery disease  DRUG ALLERGIES: No Known Allergies  Review of Systems  Constitutional: Negative for fever, chills, weight loss and malaise/fatigue.  HENT: Negative for congestion.   Eyes: Negative for blurred vision and double vision.  Respiratory: Negative for cough, sputum production, shortness of breath and wheezing.   Cardiovascular: Negative for chest pain, palpitations, orthopnea, leg swelling and PND.  Gastrointestinal: Negative for nausea, vomiting, abdominal pain, diarrhea, constipation, blood in stool and melena.  Genitourinary: Negative for dysuria, urgency, frequency and hematuria.  Musculoskeletal: Positive for back pain. Negative for falls.  Skin: Negative for rash.  Neurological: Positive for sensory change and focal weakness. Negative for dizziness and weakness.  Psychiatric/Behavioral: Negative for depression and memory loss. The patient is not nervous/anxious.     MEDICATIONS AT HOME:  Prior to Admission medications   Medication Sig Start Date End Date Taking? Authorizing Provider  celecoxib (CELEBREX) 200  MG capsule Take 200 mg by mouth daily. 09/12/15  Yes Historical Provider, MD  Multiple Vitamins-Minerals (MULTIVITAMIN ADULT PO) Take 1 tablet by mouth daily.    Yes Historical Provider, MD  omeprazole (PRILOSEC) 20 MG capsule Take 20  mg by mouth 2 (two) times daily.  08/29/15  Yes Historical Provider, MD  sucralfate (CARAFATE) 1 g tablet Take 1 tablet (1 g total) by mouth 4 (four) times daily as needed (for abdominal discomfort, nausea, and/or vomiting). 08/28/15  Yes Hinda Kehr, MD  VENTOLIN HFA 108 (90 Base) MCG/ACT inhaler Take 1-2 puffs by mouth 3 (three) times daily as needed. For congestion, shortness of breath, and/or wheezing. 07/03/15  Yes Historical Provider, MD  docusate sodium (COLACE) 100 MG capsule Take 1 tablet once or twice daily as needed for constipation while taking narcotic pain medicine 08/28/15   Hinda Kehr, MD  omeprazole (PRILOSEC OTC) 20 MG tablet Take 1 tablet (20 mg total) by mouth daily. 08/28/15 08/27/16  Hinda Kehr, MD  oxyCODONE-acetaminophen (ROXICET) 5-325 MG tablet Take 1-2 tablets by mouth every 4 (four) hours as needed for severe pain. 08/28/15   Hinda Kehr, MD      PHYSICAL EXAMINATION:   VITAL SIGNS: Blood pressure 154/75, pulse 69, temperature 97.5 F (36.4 C), temperature source Oral, resp. rate 18, height _0  (1.854 m), weight 88.451 kg (195 lb), SpO2 100 %.  GENERAL:  65 y.o.-year-old patient lying in the bed with no acute distress.  EYES: Pupils equal, round, reactive to light and accommodation. No scleral icterus. Extraocular muscles intact.  HEENT: Head atraumatic, normocephalic. Oropharynx and nasopharynx clear.  NECK:  Supple, no jugular venous distention. No thyroid enlargement, no tenderness.  LUNGS: Normal breath sounds bilaterally, no wheezing, rales,rhonchi or crepitation. No use of accessory muscles of respiration.  CARDIOVASCULAR: S1, S2 irregularly irregular. No murmurs, rubs, or gallops.  ABDOMEN: Soft, nontender, nondistended. Bowel sounds present. No organomegaly or mass.  EXTREMITIES: No pedal edema, cyanosis, or clubbing. Cool lower extremities from the knees down NeuroLOGIC: Cranial nerves II through XII are intact. Muscle strength 5/5 in all extremities,  except the right lower extremity, which is 0 out of 5 in knee flexing, foot flexing, knee extension is spared.. Sensation Impaired in the right lower extremity below the knee, diminished to light touch . Gait not checked.  PSYCHIATRIC: The patient is alert and oriented x 3.  SKIN: No obvious rash, lesion, or ulcer.   LABORATORY PANEL:   CBC  Recent Labs Lab 09/19/15 1233  WBC 13.2*  HGB 15.2  HCT 43.1  PLT 194  MCV 87.6  MCH 31.0  MCHC 35.4  RDW 14.8*  LYMPHSABS 1.9  MONOABS 0.8  EOSABS 0.1  BASOSABS 0.1   ------------------------------------------------------------------------------------------------------------------  Chemistries   Recent Labs Lab 09/19/15 1233  NA 139  K 3.8  CL 108  CO2 23  GLUCOSE 110*  BUN 11  CREATININE 1.03  CALCIUM 9.1  AST 31  ALT 27  ALKPHOS 68  BILITOT 1.2   ------------------------------------------------------------------------------------------------------------------  Cardiac Enzymes No results for input(s): TROPONINI in the last 168 hours. ------------------------------------------------------------------------------------------------------------------  RADIOLOGY: Ct Angio Ao+bifem W/cm &/or Wo/cm  09/19/2015  CLINICAL DATA:  65 year old male with a history of numbness right leg. EXAM: CT ABDOMEN AND PELVIS WITH CONTRAST TECHNIQUE: Multidetector CT imaging of the abdomen and pelvis was performed using the standard protocol following bolus administration of intravenous contrast. CONTRAST:  125 cc Isovue 370 COMPARISON:  CT 08/28/2015 FINDINGS: Lower chest: Unremarkable appearance of the soft tissues  of the chest wall. Heart size within normal limits.  No pericardial fluid/thickening. No lower mediastinal adenopathy. Unremarkable appearance of the distal esophagus. No hiatal hernia. No confluent airspace disease, pleural fluid, or pneumothorax within visualized lung. 3 mm nodule of the right middle lobe is again present.  Abdomen/pelvis: Nonvascular: Unremarkable appearance of liver and spleen. Unremarkable appearance of bilateral adrenal glands. Unremarkable appearance of the pancreas. Significantly improved inflammatory changes of the pancreaticoduodenal groove. Unremarkable appearance of the gallbladder. No radiopaque cholelithiasis. No free air or significant free fluid. No abnormally distended small bowel or colon. Colonic diverticula without evidence of associated inflammatory changes. Small formed stool burden. Normal appendix with high density material within the lumen, potentially inspissated secretions/concretions. Unremarkable appearance of the bilateral kidneys with no hydronephrosis or nephrolithiasis. Unremarkable appearance of the course of the bilateral ureters. Unremarkable appearance of the urinary bladder. Unremarkable prostate. Vascular: Aorta: Mixed calcified and soft plaque of the abdominal aorta, with the preponderance of disease in the infrarenal abdominal aorta where there is circumferential disease. No aneurysm or dissection flap. No periaortic fluid. No periaortic inflammatory changes. Mesenteric vasculature: Celiac artery patent, with atherosclerotic changes at the origin. There is suggestion of 50% narrowing at the origin. Superior mesenteric artery is patent at the origin with atherosclerotic changes. Atherosclerotic plaque at the origin the right will renal artery with perhaps 50% stenosis. Atherosclerotic changes at the origin of the left renal artery with perhaps 50% stenosis. Inferior mesenteric artery appears to maintain patency at the origin. Right lower extremity: Right common iliac artery and external iliac artery are patent with atherosclerotic changes. No aneurysm or dissection flap. 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 As was demonstrated on prior CT, there is occlusion of the common femoral artery, with reconstitution of the proximal superficial femoral artery and profunda  femoris via collateral flow. The remainder of the length of the superficial femoral artery maintains patency, with partially calcified plaque within the adductor canal. Popliteal artery is patent to the trifurcation. Proximal anterior tibial artery demonstrates atherosclerotic disease though appears patent proximally. Tibioperoneal trunk appears patent. Proximal peroneal artery and posterior tibial artery appear patent. No contrast identified within the distal anterior tibial artery or peroneal artery, although this may be a consequence of early image acquisition. No delay was performed. Posterior tibial artery appears to have contrast filling through the length to the malleolus. 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 Hypogastric artery is occluded at the origin. Reconstitution of the common femoral artery via collateral flow from inferior epigastric artery. Profunda femoris and left superficial femoral artery are patent. Superficial femoral artery is patent throughout its course with atherosclerotic plaque at the adductor canal. Popliteal artery is patent to the trifurcation. Anterior tibial artery demonstrates atherosclerotic plaque at the origin though is patent proximally. Tibioperoneal trunk is patent. Peroneal artery and posterior tibial artery are patent proximally. No contrast identified within the distal peroneal artery and anterior tibial artery, although this may be a consequence of early image acquisition. No delay was performed. Posterior tibial artery appears to have contrast filling through the length of the malleolus. Musculoskeletal: No displaced fracture. No bony canal narrowing. Degenerative disc disease of the lower thoracic and lumbar spine, similar to the comparison CT. No new evidence of disc bulge/herniation. IMPRESSION: Vascular: Aortic atherosclerosis and iliac disease. The pattern of disease appears similar to  the comparison CT of June 2017, with circumferential soft plaque of the infrarenal abdominal aorta,  and occlusion of the left common iliac artery and external iliac artery. Right femoral popliteal disease. 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 Right infrapopliteal disease, with patency of proximal AT, TP trunk, proximal peroneal artery, and proximal PT maintained. The PT appears patent to the ankle, although there is absence of contrast at the distal right AT and peroneal artery. This may represent either imaging before arrival of the contrast bolus or alternatively occlusion. Left femoral popliteal disease. Mild mixed calcified and soft plaque of the left superficial femoral artery without occlusion. Popliteal artery patent to the trifurcation. Left infrapopliteal disease, with patency of proximal AT, TP trunk, proximal peroneal artery, and proximal PT maintained. The PT appears patent to the ankle, although there is absence of contrast at the distal right AT and peroneal artery. This may represent either imaging before arrival of the contrast bolus or alternatively occlusion. These results were discussed by telephone at the time of interpretation on 09/19/2015 at 3:25 pm with Dr. Annamarie Major of Vascular Surgery Service. Mesenteric and bilateral renal artery atherosclerotic changes, as above. Nonvascular: Improved inflammatory changes near the duodenum in this patient with a history of duodenal ulcer. Re- demonstration of right middle lobe pulmonary nodule. Repeat noncontrast chest CT in June 2018 (1 year after the most most recent CT) is again recommended. Signed, Dulcy Fanny. Earleen Newport, DO [facility] Electronically Signed   By: Corrie Mckusick D.O.   On: 09/19/2015 15:29    EKG: Orders placed or  performed during the hospital encounter of 08/28/15  . ED EKG  . ED EKG  . EKG 12-Lead  . EKG 12-Lead  . EKG  EKG in the emergency room revealed A. fib at 63 bpm, normal axis, QRS in V1, V2, possible anteroseptal infarct, old, no acute ST-T changes  IMPRESSION AND PLAN:  Active Problems:   Weakness of right lower extremity   Peripheral vascular disease (HCC)   Tobacco abuse counseling   Leukocytosis   CVA (cerebral infarction) #1. Right lower extremity weakness and numbness from the knee down below, etiology remains somewhat unclear, neurologic versus vascular versus both, admitted to a medical floor, initiate heparin intravenously, aspirin, get MRI of the brain, vascular and neurologic consultations, physical therapist and occupational therapist involvement  . #2. Peripheral vascular disease, vascular surgery to evaluate patient for possible procedure during this admission, continue heparin intravenously, aspirin   #3. Leukocytosis, follow with therapy #4. Tobacco abuse counseling. Discussed this patient for 4 minutes, nicotine replacement therapy will be initiated, he is agreeable #5. Peptic ulcer disease, continue patient on PPI and Carafate, clinically improved, gaining weight #6. A. fib, new onset, rate controlled, now on heparin intravenously, getting echocardiogram and cardiology consultation, I'm concerned that atrial fibrillation could have resulted in thromboembolic event to right lower extremity    All the records are reviewed and case discussed with ED provider. Management plans discussed with the patient, family and they are in agreement.  CODE STATUS: Code Status History    This patient does not have a recorded code status. Please follow your organizational policy for patients in this situation.       TOTAL TIME TAKING CARE OF THIS PATIENT: 55 minutes.    Theodoro Grist M.D on 09/19/2015 at 3:47 PM  Between 7am to 6pm - Pager - [phone] After 6pm go to  [email or web address] - password [email or web address]  [facility]  Office  [phone]  CC: Primary care physician; Rusty Aus, MD

## 2015-09-19 NOTE — ED Notes (Signed)
Vascular Surgeon at bedside. 

## 2015-09-19 NOTE — Consult Note (Signed)
Consult Note  Patient name: Gary Mccoy. MRN: [id number] DOB: 28-Aug-1950 Sex: male  Consulting Physician:  ER (Dr. Allen Norris)  Reason for Consult:  Chief Complaint  Patient presents with  . Numbness    HISTORY OF PRESENT ILLNESS: This is a 65 year old male who presented to the ER today with complaints of the inability to move his right leg or toes.  He states this began about 8am the day prior (approximately 36 hours ago).  2 days ago he noticed a cramp in his right calf which never improved.  He thought he had bruised a nerve, as he has had similar episodes with his arms in the past.  He states that he has feeling in his right foot, but it may be a little numb when compared to the left.  He does not have any pain in the right foot, and he has not noticed any color change.  The patient has suffered from chronic back pain and has had surgery approximately 10 years ago.  He reports that with walking he gets the feeling his legs are going to give out at 50 yards.  He gets pain in his pelvis and he has to rest.  Resting improves his symptoms.  Both legs bother him the same.  He has assumed his leg issues were related to his back problems.  He has gotten multiple epidurals in the past, somewhat regularly which improve his symptoms.    He was in the Hospital approximately 1 month ago for a duodenal ulcer.  At that time a CT of the abdomen and pelvis with IV contrast revealed an occluded left common and external iliac and an occluded right common femoral artery.  The SFA and profunda were patent proximately.  The patient is a current smoker.  He denies any back pain  Past Medical History  Diagnosis Date  . Wears dentures     full upper and lower  . Leg weakness, bilateral     s/p lumbar surgery  . Arthritis     lower back  . GERD (gastroesophageal reflux disease)     Past Surgical History  Procedure Laterality Date  . Cervical disc surgery  1987  . Back surgery  2005  .  Esophagogastroduodenoscopy (egd) with propofol N/A 09/04/2015    Procedure: ESOPHAGOGASTRODUODENOSCOPY (EGD) WITH PROPOFOL;  Surgeon: Lucilla Lame, MD;  Location: [facility];  Service: Endoscopy;  Laterality: N/A;    Social History   Social History  . Marital Status: Married    Spouse Name: N/A  . Number of Children: N/A  . Years of Education: N/A   Occupational History  . Not on file.   Social History Main Topics  . Smoking status: Current Every Day Smoker -- 2.00 packs/day for 45 years    Types: Cigarettes  . Smokeless tobacco: Not on file  . Alcohol Use: No     Comment: occasional  . Drug Use: No  . Sexual Activity: Not on file   Other Topics Concern  . Not on file   Social History Narrative    History reviewed. No pertinent family history.  Allergies as of 09/19/2015  . (No Known Allergies)    No current facility-administered medications on file prior to encounter.   Current Outpatient Prescriptions on File Prior to Encounter  Medication Sig Dispense Refill  . Multiple Vitamins-Minerals (MULTIVITAMIN ADULT PO) Take 1 tablet by mouth daily.     . sucralfate (  CARAFATE) 1 g tablet Take 1 tablet (1 g total) by mouth 4 (four) times daily as needed (for abdominal discomfort, nausea, and/or vomiting). 30 tablet 1  . docusate sodium (COLACE) 100 MG capsule Take 1 tablet once or twice daily as needed for constipation while taking narcotic pain medicine 30 capsule 0  . omeprazole (PRILOSEC OTC) 20 MG tablet Take 1 tablet (20 mg total) by mouth daily. 28 tablet 1  . oxyCODONE-acetaminophen (ROXICET) 5-325 MG tablet Take 1-2 tablets by mouth every 4 (four) hours as needed for severe pain. 20 tablet 0     REVIEW OF SYSTEMS: Cardiovascular: No chest pain, chest pressure, palpitations, orthopnea, or dyspnea on exertion. positive claudication  Pulmonary: No productive cough, asthma or wheezing. Neurologic: see HPI Hematologic: No bleeding problems or clotting  disorders. Musculoskeletal: chronic back pain. Gastrointestinal: h/o duodenal ulcer and gastritis Genitourinary: No dysuria or hematuria. Psychiatric:: No history of major depression. Integumentary: No rashes or ulcers. Constitutional: No fever or chills.  PHYSICAL EXAMINATION: General: The patient appears their stated age.  Vital signs are BP 161/69 mmHg  Pulse 68  Temp(Src) 97.7 F (36.5 C) (Oral)  Resp 18  Ht _0  (1.854 m)  Wt 195 lb (88.451 kg)  BMI 25.73 kg/m2  SpO2 100% Pulmonary: Respirations are non-labored HEENT:  No gross abnormalities Abdomen: Soft and non-tender  Musculoskeletal: There are no major deformities.   Neurologic: inability to dorsifelx right ankle.  No movement in right toes.  Sensation is present to light touch Skin: There are no ulcer or rashes noted. Psychiatric: The patient has normal affect. Cardiovascular: irregular rhythm.  Easily dopplerable biphasic poster tibial signal in the right ankle.  The right foot is slightly cooler than the left, but there is no mottling.  The color is the same as the left foot  Diagnostic Studies: I have personally reviewed the CTA and discussed it in detail with interventional radiology with the following findings: Aortic atherosclerosis and iliac disease. 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  Right femoral popliteal disease. 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  Right infrapopliteal disease, with patency of proximal AT, TP trunk, proximal peroneal artery, and proximal PT maintained. The PT appears patent to the ankle, although there is absence of contrast at the distal right AT  and peroneal artery. This may represent either imaging before arrival of the contrast bolus or alternatively occlusion.  Left femoral popliteal disease. Mild mixed calcified and soft plaque of the left superficial femoral artery without occlusion. Popliteal artery patent to the trifurcation.  Left infrapopliteal disease, with patency of proximal AT, TP trunk, proximal peroneal artery, and proximal PT maintained. The PT appears patent to the ankle, although there is absence of contrast at the distal right AT and peroneal artery. This may represent either imaging before arrival of the contrast bolus or alternatively occlusion   Assessment:  Right leg weakness Plan: I discussed with the patient and his wife that his clinical picture is not consistent with acute vascular ischemia.  His symptoms have been going on for at least 36 hours.  His CT scan today is very similar in appearance to the scan he had 1 month ago.  He has a significant motor  deficit in the right leg, but his sensation is still present.  In addition, he has no pain.  Also, he has a biphasic PT doppler signal. If this was an acute vascular occlusion, I would expect him to have at least some skin color changes and more sensory deficits.  I discussed that I would like to get more information regarding his neurologic status before proceeding with vascular intervention.  I would like to rule out a stroke and spinal cord infarct with MRI first. Also, with AFIB, he will need cardiology risk stratification.  He will be placed on IV heparin and notify me if there are any changes in his clinical picture.  Given that his symptoms have been unchanged for 36 hours, delaying vascular intervention to rule out other possible etiologies for his leg weakness will not have any significant impact on his long term prognosis.     Eldridge Abrahams, M.D. [facility] Office: [phone] Pager:  [phone]

## 2015-09-19 NOTE — ED Provider Notes (Addendum)
[facility] Emergency Department Provider Note   ____________________________________________  Time seen: Approximately 12:40 PM  I have reviewed the triage vital signs and the nursing notes.   HISTORY  Chief Complaint Numbness    HPI Gary A Barnie Sopko. is a 65 y.o. male with history of chronic back pain status post lumbar laminectomy in 2006 presents for evaluation of right leg numbness and weakness since yesterday, gradual onset, constant, no modifying factors, severe. The patient reports that yesterday he developed a severe cramp in the right calf and then after that he could no longer move the right toes and he has had decreased sensation in the right toe/foot ever since. He denies any severe back pain, no recent trauma or falls. He denies any chest or abdominal pain. No vomiting, diarrhea, fevers or chills.He denies any bowel or bladder incontinence, no history of malignancy. His chronic back pain is unchanged from prior, not increase in severity or changed in character.   Past Medical History  Diagnosis Date  . Wears dentures     full upper and lower  . Leg weakness, bilateral     s/p lumbar surgery  . Arthritis     lower back  . GERD (gastroesophageal reflux disease)     Patient Active Problem List   Diagnosis Date Noted  . Weakness of right lower extremity 09/19/2015  . Tobacco abuse counseling 09/19/2015  . Peripheral vascular disease ([place]) 09/19/2015  . Leukocytosis 09/19/2015  . CVA (cerebral infarction) 09/19/2015  . Paralysis ([place])   . Abnormal findings-gastrointestinal tract   . Gastritis   . Duodenal ulcer disease   . 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 08/29/2015  . Contact with and (suspected ) exposure to other hazardous substances 01/27/2014  . Degeneration of intervertebral disc of lumbar region 11/12/2013  . Degeneration of intervertebral disc of lumbosacral region 11/12/2013  . Neuritis or radiculitis due to rupture of lumbar  intervertebral disc 11/12/2013  . Thoracic neuritis 11/12/2013  . Benign essential HTN 02/01/2013    Past Surgical History  Procedure Laterality Date  . Cervical disc surgery  1987  . Back surgery  2005  . Esophagogastroduodenoscopy (egd) with propofol N/A 09/04/2015    Procedure: ESOPHAGOGASTRODUODENOSCOPY (EGD) WITH PROPOFOL;  Surgeon: Lucilla Lame, MD;  Location: [facility];  Service: Endoscopy;  Laterality: N/A;    No current outpatient prescriptions on file.  Allergies Review of patient's allergies indicates no known allergies.  History reviewed. No pertinent family history.  Social History Social History  Substance Use Topics  . Smoking status: Current Every Day Smoker -- 2.00 packs/day for 45 years    Types: Cigarettes  . Smokeless tobacco: None  . Alcohol Use: No     Comment: occasional    Review of Systems Constitutional: No fever/chills Eyes: No visual changes. ENT: No sore throat. Cardiovascular: Denies chest pain. Respiratory: Denies shortness of breath. Gastrointestinal: No abdominal pain.  No nausea, no vomiting.  No diarrhea.  No constipation. Genitourinary: Negative for dysuria. Musculoskeletal: Positive for chronic back pain. Skin: Negative for rash. Neurological: Negative for headaches. Positive for focal right leg numbness and weakness.  10-point ROS otherwise negative.  ____________________________________________   PHYSICAL EXAM:  VITAL SIGNS: ED Triage Vitals  Enc Vitals Group     BP 09/19/15 1218 132/57 mmHg     Pulse Rate 09/19/15 1218 82     Resp 09/19/15 1218 20     Temp 09/19/15 1218 97.5 F (36.4 C)     Temp Source  09/19/15 1218 Oral     SpO2 09/19/15 1218 100 %     Weight 09/19/15 1218 195 lb (88.451 kg)     Height 09/19/15 1218 _0  (1.854 m)     Head Cir --      Peak Flow --      Pain Score --      Pain Loc --      Pain Edu? --      Excl. in [place]? --     Constitutional: Alert and oriented. Well appearing and in  no acute distress. Eyes: Conjunctivae are normal. PERRL. EOMI. Head: Atraumatic. Nose: No congestion/rhinnorhea. Mouth/Throat: Mucous membranes are moist.  Oropharynx non-erythematous. Neck: No stridor.  Supple without meningismus. Cardiovascular: Normal rate, regular rhythm. Grossly normal heart sounds.  Good peripheral circulation. Respiratory: Normal respiratory effort.  No retractions. Lungs CTAB. Gastrointestinal: Soft and nontender. No distention.  No CVA tenderness. Genitourinary: deferred Musculoskeletal: The left foot is warm and well perfused with a dopplered DP pulse, he is able to wiggle the toes. Right foot is slightly cooler to the touch, there is no palpable or dopplered DP or PT pulse, the patient is unable to move any of the toes, he has decreased sensation throughout the entire right foot. Neurologic:  Normal speech and language. Decreased sensation in the right foot, unable to wiggle the toes of the right foot. 5/5 strength in the right lower extremity and bilateral upper extremities. Skin:  Skin is warm, dry and intact. No rash noted. Psychiatric: Mood and affect are normal. Speech and behavior are normal.  ____________________________________________   LABS (all labs ordered are listed, but only abnormal results are displayed)  Labs Reviewed  CBC WITH DIFFERENTIAL/PLATELET - Abnormal; Notable for the following:    WBC 13.2 (*)    RDW 14.8 (*)    Neutro Abs 10.3 (*)    All other components within normal limits  COMPREHENSIVE METABOLIC PANEL - Abnormal; Notable for the following:    Glucose, Bld 110 (*)    All other components within normal limits  URINALYSIS COMPLETEWITH MICROSCOPIC ([facility] ONLY) - Abnormal; Notable for the following:    Color, Urine YELLOW (*)    APPearance CLEAR (*)    Bacteria, UA RARE (*)    Squamous Epithelial / LPF 0-5 (*)    All other components within normal limits  HEPARIN LEVEL (UNFRACTIONATED) - Abnormal; Notable for the following:     Heparin Unfractionated <0.10 (*)    All other components within normal limits  HEMOGLOBIN A1C - Abnormal; Notable for the following:    Hgb A1c MFr Bld 6.1 (*)    All other components within normal limits  LIPID PANEL - Abnormal; Notable for the following:    HDL 39 (*)    LDL Cholesterol 116 (*)    All other components within normal limits  PROTIME-INR  APTT  CK  TSH  BASIC METABOLIC PANEL  CBC  HEPARIN LEVEL (UNFRACTIONATED)  HEPARIN LEVEL (UNFRACTIONATED)  HEPARIN LEVEL (UNFRACTIONATED)   ____________________________________________  EKG  ED ECG REPORT I, Joanne Gavel, the attending physician, personally viewed and interpreted this ECG.   Date: 09/19/2015  EKG Time: 3:57 pm  Rate: 63  Rhythm: normal sinus rhythm  Axis: normal  Intervals:none  ST&T Change: No acute ST elevation or acute ST depression.   ____________________________________________  [id number]  CTA abdomen and pelvis with runoff - pending At time of admission. ____________________________________________   PROCEDURES  Procedure(s) performed: None  Procedures  Critical Care  performed: No  ____________________________________________   INITIAL IMPRESSION / ASSESSMENT AND PLAN / ED COURSE  Pertinent labs & imaging results that were available during my care of the patient were reviewed by me and considered in my medical decision making (see chart for details).  Gary A Flavio Lindroth. is a 65 y.o. male with history of chronic back pain status post lumbar laminectomy in 2006 presents for evaluation of right leg numbness and weakness since yesterday. On exam, he is very well-appearing and in no acute distress, vital signs stable, he is afebrile. I am unable to doppler a pulse in the right foot and given his numbness and weakness, the concerns for acute limb ischemia. Discussed the case with Dr. Trula Slade at 1:45 pm for evaluation, Awaiting screening labs, CTA aorta with runoff  pending.  ----------------------------------------- 3:14 PM on 09/19/2015 ----------------------------------------- Dr. Trula Slade has evaluated the patient, recommends heparin drip as well as hospitalist admission for medical clearance for likely operative management. [name] clinical picture is concerning for acute limb ischemia however not altogether consistent with complete ischemia. The differential remains broad at this time. I discussed the case with the hospitalist at this time for admission. _ ___________________________________________   FINAL CLINICAL IMPRESSION(S) / ED DIAGNOSES  Final diagnoses:  Lower limb ischemia      NEW MEDICATIONS STARTED DURING THIS VISIT:  Current Discharge Medication List       Note:  This document was prepared using Dragon voice recognition software and may include unintentional dictation errors.    Joanne Gavel, MD 09/21/15 1604  Joanne Gavel, MD 09/21/15 [phone]

## 2015-09-20 ENCOUNTER — Inpatient Hospital Stay
Admit: 2015-09-20 | Discharge: 2015-09-20 | Disposition: A | Discharge status: Home / Self Care | Payer: Managed Care, Other (non HMO) | Attending: Internal Medicine | Admitting: Internal Medicine

## 2015-09-20 ENCOUNTER — Inpatient Hospital Stay: Payer: Managed Care, Other (non HMO)

## 2015-09-20 DIAGNOSIS — R29898 Other symptoms and signs involving the musculoskeletal system: Secondary | ICD-10-CM

## 2015-09-20 DIAGNOSIS — I639 Cerebral infarction, unspecified: Secondary | ICD-10-CM

## 2015-09-20 HISTORY — DX: Cerebral infarction, unspecified: I63.9

## 2015-09-20 LAB — BASIC METABOLIC PANEL
Anion gap: 9 (ref 5–15)
BUN: 12 mg/dL (ref 6–20)
CALCIUM: 8.9 mg/dL (ref 8.9–10.3)
CO2: 24 mmol/L (ref 22–32)
CREATININE: 1.13 mg/dL (ref 0.61–1.24)
Chloride: 109 mmol/L (ref 101–111)
GFR calc Af Amer: >60 mL/min (ref >60)
GFR calc non Af Amer: >60 mL/min (ref >60)
GLUCOSE: 93 mg/dL (ref 65–99)
Potassium: 3.9 mmol/L (ref 3.5–5.1)
Sodium: 142 mmol/L (ref 135–145)

## 2015-09-20 LAB — CBC
HCT: 41.5 % (ref 40.0–52.0)
Hemoglobin: 14.4 g/dL (ref 13.0–18.0)
MCH: 30.9 pg (ref 26.0–34.0)
MCHC: 34.8 g/dL (ref 32.0–36.0)
MCV: 88.8 fL (ref 80.0–100.0)
PLATELETS: 173 10*3/uL (ref 150–440)
RBC: 4.67 MIL/uL (ref 4.40–5.90)
RDW: 14.5 % (ref 11.5–14.5)
WBC: 9 10*3/uL (ref 3.8–10.6)

## 2015-09-20 LAB — HEPARIN LEVEL (UNFRACTIONATED)
HEPARIN UNFRACTIONATED: 0.57 [IU]/mL (ref 0.30–0.70)
Heparin Unfractionated: 0.57 IU/mL (ref 0.30–0.70)

## 2015-09-20 LAB — LIPID PANEL
CHOLESTEROL: 175 mg/dL (ref 0–200)
HDL: 39 mg/dL — ABNORMAL LOW (ref >40)
LDL Cholesterol: 116 mg/dL — ABNORMAL HIGH (ref 0–99)
Total CHOL/HDL Ratio: 4.5 RATIO
Triglycerides: 98 mg/dL (ref <150)
VLDL: 20 mg/dL (ref 0–40)

## 2015-09-20 LAB — HEMOGLOBIN A1C: Hgb A1c MFr Bld: 6.1 % — ABNORMAL HIGH (ref 4.0–6.0)

## 2015-09-20 MED ORDER — GADOBENATE DIMEGLUMINE 529 MG/ML IV SOLN
20.0000 mL | Freq: Once | INTRAVENOUS | Status: AC | PRN
  Administered 2015-09-20: 18 mL via INTRAVENOUS

## 2015-09-20 NOTE — Consult Note (Addendum)
Reason for Consult:RLE weakness  Referring Physician: Anselm Jungling  CC: Right lower extremity weakness  HPI: Gary Mccoy. is an 65 y.o. male with a history of low back problems s/p surgery, now receiving epidural injections.  Reports that on Thursday of last week noted that he had numbness from the knee down in the RLE.  This progressed and on Monday he noted that he was unable to move the RLE from the knee down.  Presented on yesterday after no improvement in symptoms.  Was evaluated by vascular and felt that his presentation was atypical for a vascular etiology.  Patient started on Heparin.  Consult called at that time.   Today patient reports that numbness remains but is improved.  Strength is improved as well but not back to baseline.    Past Medical History  Diagnosis Date  . Wears dentures     full upper and lower  . Leg weakness, bilateral     s/p lumbar surgery  . Arthritis     lower back  . GERD (gastroesophageal reflux disease)     Past Surgical History  Procedure Laterality Date  . Cervical disc surgery  1987  . Back surgery  2005  . Esophagogastroduodenoscopy (egd) with propofol N/A 09/04/2015    Procedure: ESOPHAGOGASTRODUODENOSCOPY (EGD) WITH PROPOFOL;  Surgeon: Lucilla Lame, MD;  Location: [facility];  Service: Endoscopy;  Laterality: N/A;    Family history: Father with PVD and DM died at the age of 22.  Mother with breast cancer.    Social History:  reports that he has been smoking Cigarettes.  He has a 90 pack-year smoking history. He does not have any smokeless tobacco history on file. He reports that he does not drink alcohol or use illicit drugs.  No Known Allergies  Medications:  I have reviewed the patient's current medications. Prior to Admission:  Prescriptions prior to admission  Medication Sig Dispense Refill Last Dose  . celecoxib (CELEBREX) 200 MG capsule Take 200 mg by mouth daily.  11 09/18/2015 at Unknown time  . Multiple  Vitamins-Minerals (MULTIVITAMIN ADULT PO) Take 1 tablet by mouth daily.    09/18/2015 at Unknown time  . omeprazole (PRILOSEC) 20 MG capsule Take 20 mg by mouth 2 (two) times daily.   1 09/18/2015 at Unknown time  . sucralfate (CARAFATE) 1 g tablet Take 1 tablet (1 g total) by mouth 4 (four) times daily as needed (for abdominal discomfort, nausea, and/or vomiting). 30 tablet 1 09/18/2015 at Unknown time  . VENTOLIN HFA 108 (90 Base) MCG/ACT inhaler Take 1-2 puffs by mouth 3 (three) times daily as needed. For congestion, shortness of breath, and/or wheezing.  0 Past Month at Unknown time  . docusate sodium (COLACE) 100 MG capsule Take 1 tablet once or twice daily as needed for constipation while taking narcotic pain medicine 30 capsule 0 Past Week at Unknown time  . omeprazole (PRILOSEC OTC) 20 MG tablet Take 1 tablet (20 mg total) by mouth daily. 28 tablet 1 09/03/2015 at Unknown time  . oxyCODONE-acetaminophen (ROXICET) 5-325 MG tablet Take 1-2 tablets by mouth every 4 (four) hours as needed for severe pain. 20 tablet 0 Past Week at Unknown time   Scheduled: . aspirin EC  325 mg Oral Daily  . atorvastatin  40 mg Oral q1800  . docusate sodium  200 mg Oral BID  . nicotine  21 mg Transdermal Daily  . pantoprazole  40 mg Oral Daily  . sodium chloride flush  3  mL Intravenous Q12H    ROS: History obtained from the patient  General ROS: negative for - chills, fatigue, fever, night sweats, weight gain or weight loss Psychological ROS: negative for - behavioral disorder, hallucinations, memory difficulties, mood swings or suicidal ideation Ophthalmic ROS: negative for - blurry vision, double vision, eye pain or loss of vision ENT ROS: negative for - epistaxis, nasal discharge, oral lesions, sore throat, tinnitus or vertigo Allergy and Immunology ROS: negative for - hives or itchy/watery eyes Hematological and Lymphatic ROS: negative for - bleeding problems, bruising or swollen lymph nodes Endocrine ROS:  negative for - galactorrhea, hair pattern changes, polydipsia/polyuria or temperature intolerance Respiratory ROS: negative for - cough, hemoptysis, shortness of breath or wheezing Cardiovascular ROS: negative for - chest pain, dyspnea on exertion, edema or irregular heartbeat Gastrointestinal ROS: negative for - abdominal pain, diarrhea, hematemesis, nausea/vomiting or stool incontinence Genito-Urinary ROS: negative for - dysuria, hematuria, incontinence or urinary frequency/urgency Musculoskeletal ROS: negative for - joint swelling or muscular weakness Neurological ROS: as noted in HPI Dermatological ROS: negative for rash and skin lesion changes  Physical Examination: Blood pressure 123/66, pulse 60, temperature 97.8 F (36.6 C), temperature source Oral, resp. rate 17, height _0  (1.854 m), weight 88.451 kg (195 lb), SpO2 97 %.  HEENT-  Normocephalic, no lesions, without obvious abnormality.  Normal external eye and conjunctiva.  Normal TM's bilaterally.  Normal auditory canals and external ears. Normal external nose, mucus membranes and septum.  Normal pharynx. Cardiovascular- S1, S2 normal, pulses palpable throughout   Lungs- chest clear, no wheezing, rales, normal symmetric air entry Abdomen- soft, non-tender; bowel sounds normal; no masses,  no organomegaly Extremities- no edema Lymph-no adenopathy palpable Musculoskeletal-no joint tenderness, deformity or swelling Skin-warm and dry, no hyperpigmentation, vitiligo, or suspicious lesions  Neurological Examination Mental Status: Alert, oriented, thought content appropriate.  Speech fluent without evidence of aphasia.  Able to follow 3 step commands without difficulty. Cranial Nerves: II: Discs flat bilaterally; Visual fields grossly normal, pupils equal, round, reactive to light and accommodation III,IV, VI: ptosis not present, extra-ocular motions intact bilaterally V,VII: smile symmetric, facial light touch sensation normal  bilaterally VIII: hearing normal bilaterally IX,X: gag reflex present XI: bilateral shoulder shrug XII: midline tongue extension Motor: Right : Upper extremity   5/5    Left:     Upper extremity   5/5 5/5 in the LLE.  RLE 5/5 hip flexor, quads and hamstrings, hip adductors and hip abductors.  0/5 plantar flexion and extension and foot inversion and eversion but foot held at 90 degrees.   Sensory: Pinprick and light touch decreased circumferentially from just below the knee downward and including the entire foot.   Deep Tendon Reflexes: 2+ in the UE's and at the right KJ.  Left KJ 1+.  Absent AJ's bilaterally Plantars: Right: upgoing   Left: downgoing Cerebellar: normal finger-to-nose testing normal bilaterally Gait: not tested due to safety concerns.   Laboratory Studies:   Basic Metabolic Panel:  Recent Labs Lab 09/19/15 1233 09/20/15 0007  NA 139 142  K 3.8 3.9  CL 108 109  CO2 23 24  GLUCOSE 110* 93  BUN 11 12  CREATININE 1.03 1.13  CALCIUM 9.1 8.9    Liver Function Tests:  Recent Labs Lab 09/19/15 1233  AST 31  ALT 27  ALKPHOS 68  BILITOT 1.2  PROT 7.0  ALBUMIN 4.1   No results for input(s): LIPASE, AMYLASE in the last 168 hours. No results for input(s): AMMONIA in  the last 168 hours.  CBC:  Recent Labs Lab 09/19/15 1233 09/20/15 0007  WBC 13.2* 9.0  NEUTROABS 10.3*  --   HGB 15.2 14.4  HCT 43.1 41.5  MCV 87.6 88.8  PLT 194 173    Cardiac Enzymes:  Recent Labs Lab 09/19/15 1233  CKTOTAL 313    BNP: Invalid input(s): POCBNP  CBG: No results for input(s): GLUCAP in the last 168 hours.  Microbiology: No results found for this or any previous visit.  Coagulation Studies:  Recent Labs  09/19/15 1233  LABPROT 14.2  INR 1.08    Urinalysis:  Recent Labs Lab 09/19/15 1304  COLORURINE YELLOW*  LABSPEC 1.013  PHURINE 5.0  GLUCOSEU NEGATIVE  HGBUR NEGATIVE  BILIRUBINUR NEGATIVE  KETONESUR NEGATIVE  PROTEINUR NEGATIVE   NITRITE NEGATIVE  LEUKOCYTESUR NEGATIVE    Lipid Panel:     Component Value Date/Time   CHOL 175 09/20/2015 0007   TRIG 98 09/20/2015 0007   HDL 39* 09/20/2015 0007   CHOLHDL 4.5 09/20/2015 0007   VLDL 20 09/20/2015 0007   LDLCALC 116* 09/20/2015 0007    HgbA1C:  Lab Results  Component Value Date   HGBA1C 6.1* 09/19/2015    Urine Drug Screen:  No results found for: LABOPIA, COCAINSCRNUR, LABBENZ, AMPHETMU, THCU, LABBARB  Alcohol Level: No results for input(s): ETH in the last 168 hours.  Other results: EKG: atrial fibrillation, rate 63 bpm   Imaging: Ct Angio Ao+bifem W/cm &/or Wo/cm  09/19/2015  CLINICAL DATA:  65 year old male with a history of numbness right leg. EXAM: CT ABDOMEN AND PELVIS WITH CONTRAST TECHNIQUE: Multidetector CT imaging of the abdomen and pelvis was performed using the standard protocol following bolus administration of intravenous contrast. CONTRAST:  125 cc Isovue 370 COMPARISON:  CT 08/28/2015 FINDINGS: Lower chest: Unremarkable appearance of the soft tissues of the chest wall. Heart size within normal limits.  No pericardial fluid/thickening. No lower mediastinal adenopathy. Unremarkable appearance of the distal esophagus. No hiatal hernia. No confluent airspace disease, pleural fluid, or pneumothorax within visualized lung. 3 mm nodule of the right middle lobe is again present. Abdomen/pelvis: Nonvascular: Unremarkable appearance of liver and spleen. Unremarkable appearance of bilateral adrenal glands. Unremarkable appearance of the pancreas. Significantly improved inflammatory changes of the pancreaticoduodenal groove. Unremarkable appearance of the gallbladder. No radiopaque cholelithiasis. No free air or significant free fluid. No abnormally distended small bowel or colon. Colonic diverticula without evidence of associated inflammatory changes. Small formed stool burden. Normal appendix with high density material within the lumen, potentially inspissated  secretions/concretions. Unremarkable appearance of the bilateral kidneys with no hydronephrosis or nephrolithiasis. Unremarkable appearance of the course of the bilateral ureters. Unremarkable appearance of the urinary bladder. Unremarkable prostate. Vascular: Aorta: Mixed calcified and soft plaque of the abdominal aorta, with the preponderance of disease in the infrarenal abdominal aorta where there is circumferential disease. No aneurysm or dissection flap. No periaortic fluid. No periaortic inflammatory changes. Mesenteric vasculature: Celiac artery patent, with atherosclerotic changes at the origin. There is suggestion of 50% narrowing at the origin. Superior mesenteric artery is patent at the origin with atherosclerotic changes. Atherosclerotic plaque at the origin the right will renal artery with perhaps 50% stenosis. Atherosclerotic changes at the origin of the left renal artery with perhaps 50% stenosis. Inferior mesenteric artery appears to maintain patency at the origin. Right lower extremity: Right common iliac artery and external iliac artery are patent with atherosclerotic changes. No aneurysm or dissection flap. 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  As was demonstrated on prior CT, there is occlusion of the common femoral artery, with reconstitution of the proximal superficial femoral artery and profunda femoris via collateral flow. The remainder of the length of the superficial femoral artery maintains patency, with partially calcified plaque within the adductor canal. Popliteal artery is patent to the trifurcation. Proximal anterior tibial artery demonstrates atherosclerotic disease though appears patent proximally. Tibioperoneal trunk appears patent. Proximal peroneal artery and posterior tibial artery appear patent. No contrast identified within the distal anterior tibial artery or peroneal artery, although this may be a consequence of early image acquisition. No delay was performed.  Posterior tibial artery appears to have contrast filling through the length to the malleolus. 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 Hypogastric artery is occluded at the origin. Reconstitution of the common femoral artery via collateral flow from inferior epigastric artery. Profunda femoris and left superficial femoral artery are patent. Superficial femoral artery is patent throughout its course with atherosclerotic plaque at the adductor canal. Popliteal artery is patent to the trifurcation. Anterior tibial artery demonstrates atherosclerotic plaque at the origin though is patent proximally. Tibioperoneal trunk is patent. Peroneal artery and posterior tibial artery are patent proximally. No contrast identified within the distal peroneal artery and anterior tibial artery, although this may be a consequence of early image acquisition. No delay was performed. Posterior tibial artery appears to have contrast filling through the length of the malleolus. Musculoskeletal: No displaced fracture. No bony canal narrowing. Degenerative disc disease of the lower thoracic and lumbar spine, similar to the comparison CT. No new evidence of disc bulge/herniation. IMPRESSION: Vascular: Aortic atherosclerosis and iliac disease. 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 Right femoral popliteal disease. 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 Right infrapopliteal disease, with patency of proximal AT, TP trunk, proximal peroneal artery, and proximal PT maintained. The PT appears patent to the ankle, although there is absence of  contrast at the distal right AT and peroneal artery. This may represent either imaging before arrival of the contrast bolus or alternatively occlusion. Left femoral popliteal disease. Mild mixed calcified and soft plaque of the left superficial femoral artery without occlusion. Popliteal artery patent to the trifurcation. Left infrapopliteal disease, with patency of proximal AT, TP trunk, proximal peroneal artery, and proximal PT maintained. The PT appears patent to the ankle, although there is absence of contrast at the distal right AT and peroneal artery. This may represent either imaging before arrival of the contrast bolus or alternatively occlusion. These results were discussed by telephone at the time of interpretation on 09/19/2015 at 3:25 pm with Dr. Annamarie Major of Vascular Surgery Service. Mesenteric and bilateral renal artery atherosclerotic changes, as above. Nonvascular: Improved inflammatory changes near the duodenum in this patient with a history of duodenal ulcer. Re- demonstration of right middle lobe pulmonary nodule. Repeat noncontrast chest CT in June 2018 (1 year after the most most recent CT) is again recommended. Signed, Dulcy Fanny. Earleen Newport, DO [facility] Electronically Signed   By: Corrie Mckusick D.O.   On: 09/19/2015 15:29     Assessment/Plan: 65 year old male with RLE weakness and  numbness.  Although neurological examination not particularly consistent with stroke or lumbar etiology with new onset atrial fibrillation this can not be ruled out.  It is quite interesting though that the patient ha improved with heparin.  Patient does have some upper motor neuron findings on neurological examination but unclear if these are old since patient has had previous surgery.  Agree with MRI of the brain and lumbar spine.    Will continue to follow with you.    Alexis Goodell, MD Neurology [phone] 09/20/2015, 12:02 PM

## 2015-09-20 NOTE — Evaluation (Signed)
Physical Therapy Evaluation Patient Details Name: Gary Mccoy. MRN: [id number] DOB: May 21, 1950 Today's Date: 09/20/2015   History of Present Illness  65 yo M presented to ED for R leg numbness and decreased muscle control. He was admitted for weakness and being treated for possible DVT and further work up is in progress. He was in the hospital ~ 1 month ago for a duodenal ulcer. CT was performed and showed occluded L connon and external iliac artery and R femoral artery. PMH includes back surgery, EGD 09/04/15.  Clinical Impression  Pt demonstrated generalized weakness in R LE and difficulty walking due to decreased sensation and muscle control of R lower leg. He is independent with bed mobility. Transfers without AD pt is mod I, requiring increased time to safely perform. Pt ambulated 125 ft with no AD with min guard. Generally steady with occasional minimal LOB with self correction due to catching R foot because of decreased muscle control and sensation. Pt would benefit from gait training with [facility] for improved stability. Pt will benefit from skilled PT services to increase functional I and mobility for safe discharge.     Follow Up Recommendations No PT follow up    Equipment Recommendations  Cane    Recommendations for Other Services       Precautions / Restrictions Precautions Precautions: None Restrictions Weight Bearing Restrictions: No      Mobility  Bed Mobility Overal bed mobility: Independent             General bed mobility comments: no difficulties  Transfers Overall transfer level: Modified independent Equipment used: None             General transfer comment: no difficulties, extra time required for safely placing R foot  Ambulation/Gait Ambulation/Gait assistance: Min guard Ambulation Distance (Feet): 125 Feet Assistive device: None Gait Pattern/deviations: Decreased stride length;Decreased dorsiflexion - right;Decreased step length -  right Gait velocity: reduced Gait velocity interpretation: Below normal speed for age/gender General Gait Details: Generally steady. Extra time required for placing R foot safely due to decreased sensation and muscle control of ankle.   Stairs            Wheelchair Mobility    Modified Rankin (Stroke Patients Only)       Balance Overall balance assessment: Modified Independent (needs extra time for mobility)                                           Pertinent Vitals/Pain Pain Assessment: No/denies pain    Home Living Family/patient expects to be discharged to:: Private residence Living Arrangements: Spouse/significant other Available Help at Discharge: Family Type of Home: House Home Access: Stairs to enter Entrance Stairs-Rails: Can reach both Entrance Stairs-Number of Steps: 3 Home Layout: One level Home Equipment: None      Prior Function Level of Independence: Independent         Comments: Pt independent with all ADLs, works as a Administrator.     Hand Dominance        Extremity/Trunk Assessment   Upper Extremity Assessment: Overall WFL for tasks assessed           Lower Extremity Assessment: Overall WFL for tasks assessed;RLE deficits/detail RLE Deficits / Details: muscles below the knee grossly 1/5    Cervical / Trunk Assessment: Normal  Communication   Communication: No difficulties  Cognition Arousal/Alertness:  Awake/alert Behavior During Therapy: WFL for tasks assessed/performed Overall Cognitive Status: Within Functional Limits for tasks assessed                      General Comments General comments (skin integrity, edema, etc.): scheduled for further testing    Exercises Other Exercises Other Exercises: Pt ambulated 10 ft and 125 ft with no AD with min guard. Mostly steady with occasional LOB when R foot catches due to decreased sensation and motor function below R knee. Cues for safety. Pt would  benefit from gait training with [facility].      Assessment/Plan    PT Assessment Patient needs continued PT services  PT Diagnosis Difficulty walking;Generalized weakness   PT Problem List Decreased strength;Decreased balance;Decreased mobility;Decreased knowledge of use of DME;Impaired sensation  PT Treatment Interventions DME instruction;Gait training;Stair training;Therapeutic activities;Therapeutic exercise;Balance training;Neuromuscular re-education;Patient/family education   PT Goals (Current goals can be found in the Care Plan section) Acute Rehab PT Goals Patient Stated Goal: to get the use of R foot back PT Goal Formulation: With patient/family Time For Goal Achievement: 10/04/15 Potential to Achieve Goals: Good    Frequency Min 2X/week   Barriers to discharge   none    Co-evaluation               End of Session   Activity Tolerance: Patient tolerated treatment well Patient left: in bed;with call bell/phone within reach;with family/visitor present Nurse Communication: Mobility status         Time: 3299-2426 PT Time Calculation (min) (ACUTE ONLY): 23 min   Charges:   PT Evaluation $PT Eval Moderate Complexity: 1 Procedure PT Treatments $Gait Training: 8-22 mins   PT G Codes:        Neoma Laming, PT, DPT  09/20/2015, 9:56 AM [phone]

## 2015-09-20 NOTE — Progress Notes (Signed)
OT Cancellation Note  Patient Details Name: Gary Mccoy. MRN: [id number] DOB: 1950-06-18   Cancelled Treatment:    Reason Eval/Treat Not Completed: Patient at procedure or test/ unavailable   Harrel Carina, MS, OTR/L   Harrel Carina 09/20/2015, 11:36 AM

## 2015-09-20 NOTE — Progress Notes (Signed)
[facility] [facility] [place] NAME: Gary Mccoy    MR#:  [id number]  DATE OF BIRTH:  1950-10-17  SUBJECTIVE:  CHIEF COMPLAINT:   Chief Complaint  Patient presents with  . Numbness    came with right leg and foot numbness and weakness, foot drop while walking.   Found to have iliac artery and femoral artery occlusions, but that is same as recent previous CT. Started on Heparin IV drip and feels some better today.  REVIEW OF SYSTEMS:  CONSTITUTIONAL: No fever, fatigue or weakness.  EYES: No blurred or double vision.  EARS, NOSE, AND THROAT: No tinnitus or ear pain.  RESPIRATORY: No cough, shortness of breath, wheezing or hemoptysis.  CARDIOVASCULAR: No chest pain, orthopnea, edema.  GASTROINTESTINAL: No nausea, vomiting, diarrhea or abdominal pain.  GENITOURINARY: No dysuria, hematuria.  ENDOCRINE: No polyuria, nocturia,  HEMATOLOGY: No anemia, easy bruising or bleeding SKIN: No rash or lesion. MUSCULOSKELETAL: No joint pain or arthritis.   NEUROLOGIC: No tingling, right leg numbness, weakness.  PSYCHIATRY: No anxiety or depression.   ROS  DRUG ALLERGIES:  No Known Allergies  VITALS:  Blood pressure 152/84, pulse 71, temperature 99 F (37.2 C), temperature source Oral, resp. rate 18, height _0  (1.854 m), weight 88.451 kg (195 lb), SpO2 97 %.  PHYSICAL EXAMINATION:  GENERAL:  65 y.o.-year-old patient lying in the bed with no acute distress.  EYES: Pupils equal, round, reactive to light and accommodation. No scleral icterus. Extraocular muscles intact.  HEENT: Head atraumatic, normocephalic. Oropharynx and nasopharynx clear.  NECK:  Supple, no jugular venous distention. No thyroid enlargement, no tenderness.  LUNGS: Normal breath sounds bilaterally, no wheezing, rales,rhonchi or crepitation. No use of accessory muscles of respiration.  CARDIOVASCULAR: S1, S2 normal. No murmurs, rubs, or gallops.  ABDOMEN: Soft, nontender, nondistended.  Bowel sounds present. No organomegaly or mass.  EXTREMITIES: No pedal edema, cyanosis, or clubbing.  NEUROLOGIC: Cranial nerves II through XII are intact. Muscle strength 5/5 in all extremities except right lower leg- which is 2/5 in foot. Sensation intact. Gait not checked.  PSYCHIATRIC: The patient is alert and oriented x 3.  SKIN: No obvious rash, lesion, or ulcer.   Physical Exam LABORATORY PANEL:   CBC  Recent Labs Lab 09/20/15 0007  WBC 9.0  HGB 14.4  HCT 41.5  PLT 173   ------------------------------------------------------------------------------------------------------------------  Chemistries   Recent Labs Lab 09/19/15 1233 09/20/15 0007  NA 139 142  K 3.8 3.9  CL 108 109  CO2 23 24  GLUCOSE 110* 93  BUN 11 12  CREATININE 1.03 1.13  CALCIUM 9.1 8.9  AST 31  --   ALT 27  --   ALKPHOS 68  --   BILITOT 1.2  --    ------------------------------------------------------------------------------------------------------------------  Cardiac Enzymes No results for input(s): TROPONINI in the last 168 hours. ------------------------------------------------------------------------------------------------------------------  RADIOLOGY:  [name] Contrast  09/20/2015  CLINICAL DATA:  65 year old male with right lower extremity numbness and weakness. Possible DVT. Difficulty walking. Initial encounter. EXAM: MRI HEAD WITHOUT CONTRAST TECHNIQUE: Multiplanar, multiecho pulse sequences of the brain and surrounding structures were obtained without intravenous contrast. COMPARISON:  Brain MRI 08/08/2010, head CT 07/12/2010 FINDINGS: Major intracranial vascular flow voids are stable. However, there are patchy acute cortically based infarcts in the bilateral superior frontal gyri, most confluent posteriorly at the medial left motor and sensory strip (right lower extremity representation area). The pattern in the frontal lobes is most suggestive of bilateral ACA territory  ischemia,  however, there is also two left occipital lobe small cortical infarcts (series 100, image 29). No posterior fossa restricted diffusion. No deep gray matter or temporal lobe restricted diffusion. Mild cytotoxic edema in the superior frontal lobes as seen on series 8, image 21. No associated hemorrhage or mass effect. There is new cortical encephalomalacia in the inferior left parietal and superior left occipital lobes since 2012 in keeping with remote left PCA infarct (series 7, image 11). Multiple chronic infarcts in the cerebellum, primarily the left hemisphere, appear stable since 2012. Mildly increased patchy T2 hyperintensity in the pons. Elsewhere mild for age chronic nonspecific cerebral white matter signal has mildly progressed since 2012. No midline shift, mass effect, evidence of mass lesion, ventriculomegaly, extra-axial collection. Cervicomedullary junction and pituitary are within normal limits. No chronic cerebral blood products identified. Visible internal auditory structures appear normal. Mastoids are clear. Mild paranasal sinus mucosal thickening has not significantly changed. Negative orbit and scalp soft tissues. Normal bone marrow signal. Negative visualized cervical spine. IMPRESSION: 1. Scattered acute mostly cortically based infarcts in the superior frontal gyri a left greater than right, suggesting bilateral ACA territory ischemia. No associated hemorrhage or mass effect. 2. Superimposed acute on chronic ischemia in the left posterior MCA/PCA territory which is new since 2012. 3. 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 4. Mildly progressed signal changes in the cerebral white matter and pons favored to be small vessel disease related. Electronically Signed   By: Genevie Ann M.D.   On: 09/20/2015 12:47   Mr Lumbar Spine W Wo Contrast  09/20/2015  CLINICAL DATA:  Right lower extremity weakness, numbness and difficulty walking. EXAM: MRI LUMBAR SPINE WITHOUT AND  WITH CONTRAST TECHNIQUE: Multiplanar and multiecho pulse sequences of the lumbar spine were obtained without and with intravenous contrast. CONTRAST:  61m MULTIHANCE GADOBENATE DIMEGLUMINE 529 MG/ML IV SOLN COMPARISON:  Lumbar MRI 03/26/2014. The abdominal CTA runoff 09/19/2015. FINDINGS: Segmentation: Conventional anatomy assumed, with the last open disc space designated L5-S1. Alignment:  Normal. Vertebrae: No worrisome osseous lesion, acute fracture or pars defect. 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 There are mild sacroiliac degenerative changes, worse on the right. Conus medullaris: Extends to the T12-L1 level and appears normal. Paraspinal and other soft tissues: No significant paraspinal findings. Extensive aortoiliac atherosclerosis with known occlusion of the left common iliac artery. Disc levels: L1-2: Mild disc bulging with anterior osteophytes. No spinal stenosis or nerve root encroachment. L2-3: Mild disc bulging. No spinal stenosis or nerve root encroachment. L3-4: Minimal disc bulging with mild facet and ligamentous hypertrophy. Stable minimal narrowing of the lateral recesses. No nerve root encroachment. L4-5: Stable postsurgical changes status post bilateral laminectomy. The spinal canal is adequately decompressed. There is a small disc protrusion in the right subarticular zone, contributing to asymmetric narrowing of the right lateral recess and potential right L5 nerve root encroachment. There is mild facet hypertrophy and mild right foraminal narrowing. L5-S1: Stable disc bulging, endplate osteophytes and bilateral facet hypertrophy. There is stable mild narrowing of the lateral recesses and foramina bilaterally. IMPRESSION: 1. A disc protrusion in the right subarticular zone at L4-5 appears slightly larger, contributing to asymmetric narrowing of the right lateral recess and possible right L5 nerve root encroachment. Mild right foraminal narrowing at that level  appears unchanged. 2. Stable chronic spondylosis and facet hypertrophy at L5-S1, contributing to mild narrowing of the lateral recesses and foramina bilaterally. 3. Aortic atherosclerosis with known occlusion of  the left common iliac artery. Electronically Signed   By: Richardean Sale M.D.   On: 09/20/2015 12:47   Ct Angio Ao+bifem W/cm &/or Wo/cm  09/20/2015  ADDENDUM REPORT: 09/20/2015 13:18 ADDENDUM: Addendum created to address the title of the exam. EXAM: CT ANGIOGRAPHY ABDOMEN PELVIS AND RUNOFF WITH CONTRAST Electronically Signed   By: Corrie Mckusick D.O.   On: 09/20/2015 13:18  09/20/2015  CLINICAL DATA:  65 year old male with a history of numbness right leg. EXAM: CT ABDOMEN AND PELVIS WITH CONTRAST TECHNIQUE: Multidetector CT imaging of the abdomen and pelvis was performed using the standard protocol following bolus administration of intravenous contrast. CONTRAST:  125 cc Isovue 370 COMPARISON:  CT 08/28/2015 FINDINGS: Lower chest: Unremarkable appearance of the soft tissues of the chest wall. Heart size within normal limits.  No pericardial fluid/thickening. No lower mediastinal adenopathy. Unremarkable appearance of the distal esophagus. No hiatal hernia. No confluent airspace disease, pleural fluid, or pneumothorax within visualized lung. 3 mm nodule of the right middle lobe is again present. Abdomen/pelvis: Nonvascular: Unremarkable appearance of liver and spleen. Unremarkable appearance of bilateral adrenal glands. Unremarkable appearance of the pancreas. Significantly improved inflammatory changes of the pancreaticoduodenal groove. Unremarkable appearance of the gallbladder. No radiopaque cholelithiasis. No free air or significant free fluid. No abnormally distended small bowel or colon. Colonic diverticula without evidence of associated inflammatory changes. Small formed stool burden. Normal appendix with high density material within the lumen, potentially inspissated secretions/concretions.  Unremarkable appearance of the bilateral kidneys with no hydronephrosis or nephrolithiasis. Unremarkable appearance of the course of the bilateral ureters. Unremarkable appearance of the urinary bladder. Unremarkable prostate. Vascular: Aorta: Mixed calcified and soft plaque of the abdominal aorta, with the preponderance of disease in the infrarenal abdominal aorta where there is circumferential disease. No aneurysm or dissection flap. No periaortic fluid. No periaortic inflammatory changes. Mesenteric vasculature: Celiac artery patent, with atherosclerotic changes at the origin. There is suggestion of 50% narrowing at the origin. Superior mesenteric artery is patent at the origin with atherosclerotic changes. Atherosclerotic plaque at the origin the right will renal artery with perhaps 50% stenosis. Atherosclerotic changes at the origin of the left renal artery with perhaps 50% stenosis. Inferior mesenteric artery appears to maintain patency at the origin. Right lower extremity: Right common iliac artery and external iliac artery are patent with atherosclerotic changes. No aneurysm or dissection flap. 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 As was demonstrated on prior CT, there is occlusion of the common femoral artery, with reconstitution of the proximal superficial femoral artery and profunda femoris via collateral flow. The remainder of the length of the superficial femoral artery maintains patency, with partially calcified plaque within the adductor canal. Popliteal artery is patent to the trifurcation. Proximal anterior tibial artery demonstrates atherosclerotic disease though appears patent proximally. Tibioperoneal trunk appears patent. Proximal peroneal artery and posterior tibial artery appear patent. No contrast identified within the distal anterior tibial artery or peroneal artery, although this may be a consequence of early image acquisition. No delay was performed. Posterior tibial artery  appears to have contrast filling through the length to the malleolus. 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 Hypogastric artery is occluded at the origin. Reconstitution of the common femoral artery via collateral flow from inferior epigastric artery. Profunda femoris and left superficial femoral artery are patent. Superficial femoral artery is patent throughout its course with atherosclerotic plaque at the adductor canal. Popliteal artery is  patent to the trifurcation. Anterior tibial artery demonstrates atherosclerotic plaque at the origin though is patent proximally. Tibioperoneal trunk is patent. Peroneal artery and posterior tibial artery are patent proximally. No contrast identified within the distal peroneal artery and anterior tibial artery, although this may be a consequence of early image acquisition. No delay was performed. Posterior tibial artery appears to have contrast filling through the length of the malleolus. Musculoskeletal: No displaced fracture. No bony canal narrowing. Degenerative disc disease of the lower thoracic and lumbar spine, similar to the comparison CT. No new evidence of disc bulge/herniation. IMPRESSION: Vascular: Aortic atherosclerosis and iliac disease. 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 Right femoral popliteal disease. 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 Right infrapopliteal disease, with patency of proximal AT, TP trunk, proximal peroneal artery, and proximal PT maintained. The PT appears patent to the ankle, although there is absence of contrast at the distal right  AT and peroneal artery. This may represent either imaging before arrival of the contrast bolus or alternatively occlusion. Left femoral popliteal disease. Mild mixed calcified and soft plaque of the left superficial femoral artery without occlusion. Popliteal artery patent to the trifurcation. Left infrapopliteal disease, with patency of proximal AT, TP trunk, proximal peroneal artery, and proximal PT maintained. The PT appears patent to the ankle, although there is absence of contrast at the distal right AT and peroneal artery. This may represent either imaging before arrival of the contrast bolus or alternatively occlusion. These results were discussed by telephone at the time of interpretation on 09/19/2015 at 3:25 pm with Dr. Annamarie Major of Vascular Surgery Service. Mesenteric and bilateral renal artery atherosclerotic changes, as above. Nonvascular: Improved inflammatory changes near the duodenum in this patient with a history of duodenal ulcer. Re- demonstration of right middle lobe pulmonary nodule. Repeat noncontrast chest CT in June 2018 (1 year after the most most recent CT) is again recommended. Signed, Dulcy Fanny. Earleen Newport, DO [facility] Electronically Signed: By: Corrie Mckusick D.O. On: 09/19/2015 15:29    ASSESSMENT AND PLAN:   Active Problems:   Weakness of right lower extremity   Tobacco abuse counseling   Peripheral vascular disease ([place])   Leukocytosis   CVA (cerebral infarction)   Paralysis ([place])  #1. Right lower extremity weakness and numbness from the knee down below,   as there were evidence of arterial occlusion, started on heparin drip.   Feels some better now.   Also had neuro and cardio consult.    MRI confirmed multi- thrombotic stroke.   May need anticoagulation, once confirmed plan about procedures by vascular- we may switch to oral. . #2. Peripheral vascular disease, vascular surgery to evaluate patient for  possible procedure during this admission, continue heparin intravenously, aspirin  #3. Leukocytosis, follow with therapy #4. Tobacco abuse counseling. Discussed this patient for 4 minutes, nicotine replacement therapy offered. #5. Peptic ulcer disease, continue patient on PPI and Carafate, clinically improved, gaining weight #6. A. fib, new onset, rate controlled, now on heparin intravenously, getting echocardiogram and cardiology consultation.   All the records are reviewed and case discussed with Care Management/Social Workerr. Management plans discussed with the patient, family and they are in agreement.  CODE STATUS: full.  TOTAL TIME TAKING CARE OF THIS PATIENT: 35 minutes.    POSSIBLE D/C IN 2-3 DAYS, DEPENDING ON CLINICAL CONDITION.   Vaughan Basta M.D on 09/20/2015   Between 7am to 6pm - Pager - [phone]  After 6pm go to [email or web address] - password [email or web address] [facility]  Office  [phone]  CC: Primary care physician; Rusty Aus, MD  Note: This dictation was prepared with Dragon dictation along with smaller phrase technology. Any transcriptional errors that result from this process are unintentional.

## 2015-09-20 NOTE — Consult Note (Signed)
[facility]  CARDIOLOGY CONSULT NOTE  Patient ID: Gary Mccoy. MRN: [id number] DOB/AGE: 17-Oct-1950 65 y.o.  Admit date: 09/19/2015 Referring Physician Anselm Jungling Primary Physician [facility] Primary Cardiologist  Reason for Consultation Atrial fibrillation  HPI: 65 year old gentleman referred for evaluation of atrial fibrillation. The patient presents on 09/19/2015 with 2 day history of right calf discomfort, with this, discoloration and inability to move his foot and toes. ECG was performed, with computerized reading of atrial fibrillation. Review of ECG, reveals regular rate and rhythm, with P waves, consistent with sinus rhythm, not atrial fibrillation. The patient denies chest pain, shortness of breath, palpitations, heart racing, presyncope or syncope. He denies prior history of atrial fibrillation. CT angiography reveals occluded right common femoral artery.  Review of systems complete and found to be negative unless listed above     Past Medical History  Diagnosis Date  . Wears dentures     full upper and lower  . Leg weakness, bilateral     s/p lumbar surgery  . Arthritis     lower back  . GERD (gastroesophageal reflux disease)     Past Surgical History  Procedure Laterality Date  . Cervical disc surgery  1987  . Back surgery  2005  . Esophagogastroduodenoscopy (egd) with propofol N/A 09/04/2015    Procedure: ESOPHAGOGASTRODUODENOSCOPY (EGD) WITH PROPOFOL;  Surgeon: Lucilla Lame, MD;  Location: [place];  Service: Endoscopy;  Laterality: N/A;    Prescriptions prior to admission  Medication Sig Dispense Refill Last Dose  . celecoxib (CELEBREX) 200 MG capsule Take 200 mg by mouth daily.  11 09/18/2015 at Unknown time  . Multiple Vitamins-Minerals (MULTIVITAMIN ADULT PO) Take 1 tablet by mouth daily.    09/18/2015 at Unknown time  . omeprazole (PRILOSEC) 20 MG capsule Take 20 mg by mouth 2 (two) times daily.   1 09/18/2015 at Unknown time  . sucralfate (CARAFATE) 1 g  tablet Take 1 tablet (1 g total) by mouth 4 (four) times daily as needed (for abdominal discomfort, nausea, and/or vomiting). 30 tablet 1 09/18/2015 at Unknown time  . VENTOLIN HFA 108 (90 Base) MCG/ACT inhaler Take 1-2 puffs by mouth 3 (three) times daily as needed. For congestion, shortness of breath, and/or wheezing.  0 Past Month at Unknown time  . docusate sodium (COLACE) 100 MG capsule Take 1 tablet once or twice daily as needed for constipation while taking narcotic pain medicine 30 capsule 0 Past Week at Unknown time  . omeprazole (PRILOSEC OTC) 20 MG tablet Take 1 tablet (20 mg total) by mouth daily. 28 tablet 1 09/03/2015 at Unknown time  . oxyCODONE-acetaminophen (ROXICET) 5-325 MG tablet Take 1-2 tablets by mouth every 4 (four) hours as needed for severe pain. 20 tablet 0 Past Week at Unknown time   Social History   Social History  . Marital Status: Married    Spouse Name: N/A  . Number of Children: N/A  . Years of Education: N/A   Occupational History  . Not on file.   Social History Main Topics  . Smoking status: Current Every Day Smoker -- 2.00 packs/day for 45 years    Types: Cigarettes  . Smokeless tobacco: Not on file  . Alcohol Use: No     Comment: occasional  . Drug Use: No  . Sexual Activity: Not on file   Other Topics Concern  . Not on file   Social History Narrative    History reviewed. No pertinent family history.    Review of systems  complete and found to be negative unless listed above      PHYSICAL EXAM  General: Well developed, well nourished, in no acute distress HEENT:  Normocephalic and atramatic Neck:  No JVD.  Lungs: Clear bilaterally to auscultation and percussion. Heart: HRRR . Normal S1 and S2 without gallops or murmurs.  Abdomen: Bowel sounds are positive, abdomen soft and non-tender  Msk:  Back normal, normal gait. Normal strength and tone for age. Extremities: No clubbing, cyanosis or edema.   Neuro: Alert and oriented X 3. Psych:   Good affect, responds appropriately  Labs:   Lab Results  Component Value Date   WBC 9.0 09/20/2015   HGB 14.4 09/20/2015   HCT 41.5 09/20/2015   MCV 88.8 09/20/2015   PLT 173 09/20/2015    Recent Labs Lab 09/19/15 1233 09/20/15 0007  NA 139 142  K 3.8 3.9  CL 108 109  CO2 23 24  BUN 11 12  CREATININE 1.03 1.13  CALCIUM 9.1 8.9  PROT 7.0  --   BILITOT 1.2  --   ALKPHOS 68  --   ALT 27  --   AST 31  --   GLUCOSE 110* 93   Lab Results  Component Value Date   CKTOTAL 313 09/19/2015    Lab Results  Component Value Date   CHOL 175 09/20/2015   Lab Results  Component Value Date   HDL 39* 09/20/2015   Lab Results  Component Value Date   LDLCALC 116* 09/20/2015   Lab Results  Component Value Date   TRIG 98 09/20/2015   Lab Results  Component Value Date   CHOLHDL 4.5 09/20/2015   No results found for: LDLDIRECT    Radiology: [name] Contrast  09/20/2015  CLINICAL DATA:  65 year old male with right lower extremity numbness and weakness. Possible DVT. Difficulty walking. Initial encounter. EXAM: MRI HEAD WITHOUT CONTRAST TECHNIQUE: Multiplanar, multiecho pulse sequences of the brain and surrounding structures were obtained without intravenous contrast. COMPARISON:  Brain MRI 08/08/2010, head CT 07/12/2010 FINDINGS: Major intracranial vascular flow voids are stable. However, there are patchy acute cortically based infarcts in the bilateral superior frontal gyri, most confluent posteriorly at the medial left motor and sensory strip (right lower extremity representation area). The pattern in the frontal lobes is most suggestive of bilateral ACA territory ischemia, however, there is also two left occipital lobe small cortical infarcts (series 100, image 29). No posterior fossa restricted diffusion. No deep gray matter or temporal lobe restricted diffusion. Mild cytotoxic edema in the superior frontal lobes as seen on series 8, image 21. No associated hemorrhage or mass  effect. There is new cortical encephalomalacia in the inferior left parietal and superior left occipital lobes since 2012 in keeping with remote left PCA infarct (series 7, image 11). Multiple chronic infarcts in the cerebellum, primarily the left hemisphere, appear stable since 2012. Mildly increased patchy T2 hyperintensity in the pons. Elsewhere mild for age chronic nonspecific cerebral white matter signal has mildly progressed since 2012. No midline shift, mass effect, evidence of mass lesion, ventriculomegaly, extra-axial collection. Cervicomedullary junction and pituitary are within normal limits. No chronic cerebral blood products identified. Visible internal auditory structures appear normal. Mastoids are clear. Mild paranasal sinus mucosal thickening has not significantly changed. Negative orbit and scalp soft tissues. Normal bone marrow signal. Negative visualized cervical spine. IMPRESSION: 1. Scattered acute mostly cortically based infarcts in the superior frontal gyri a left greater than right, suggesting bilateral ACA territory ischemia. No associated  hemorrhage or mass effect. 2. Superimposed acute on chronic ischemia in the left posterior MCA/PCA territory which is new since 2012. 3. 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 4. Mildly progressed signal changes in the cerebral white matter and pons favored to be small vessel disease related. Electronically Signed   By: Genevie Ann M.D.   On: 09/20/2015 12:47   Mr Lumbar Spine W Wo Contrast  09/20/2015  CLINICAL DATA:  Right lower extremity weakness, numbness and difficulty walking. EXAM: MRI LUMBAR SPINE WITHOUT AND WITH CONTRAST TECHNIQUE: Multiplanar and multiecho pulse sequences of the lumbar spine were obtained without and with intravenous contrast. CONTRAST:  23m MULTIHANCE GADOBENATE DIMEGLUMINE 529 MG/ML IV SOLN COMPARISON:  Lumbar MRI 03/26/2014. The abdominal CTA runoff 09/19/2015. FINDINGS: Segmentation:  Conventional anatomy assumed, with the last open disc space designated L5-S1. Alignment:  Normal. Vertebrae: No worrisome osseous lesion, acute fracture or pars defect. 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 There are mild sacroiliac degenerative changes, worse on the right. Conus medullaris: Extends to the T12-L1 level and appears normal. Paraspinal and other soft tissues: No significant paraspinal findings. Extensive aortoiliac atherosclerosis with known occlusion of the left common iliac artery. Disc levels: L1-2: Mild disc bulging with anterior osteophytes. No spinal stenosis or nerve root encroachment. L2-3: Mild disc bulging. No spinal stenosis or nerve root encroachment. L3-4: Minimal disc bulging with mild facet and ligamentous hypertrophy. Stable minimal narrowing of the lateral recesses. No nerve root encroachment. L4-5: Stable postsurgical changes status post bilateral laminectomy. The spinal canal is adequately decompressed. There is a small disc protrusion in the right subarticular zone, contributing to asymmetric narrowing of the right lateral recess and potential right L5 nerve root encroachment. There is mild facet hypertrophy and mild right foraminal narrowing. L5-S1: Stable disc bulging, endplate osteophytes and bilateral facet hypertrophy. There is stable mild narrowing of the lateral recesses and foramina bilaterally. IMPRESSION: 1. A disc protrusion in the right subarticular zone at L4-5 appears slightly larger, contributing to asymmetric narrowing of the right lateral recess and possible right L5 nerve root encroachment. Mild right foraminal narrowing at that level appears unchanged. 2. Stable chronic spondylosis and facet hypertrophy at L5-S1, contributing to mild narrowing of the lateral recesses and foramina bilaterally. 3. 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 Electronically Signed   By: WRichardean SaleM.D.   On: 09/20/2015  12:47   UKoreaAbdomen Complete  08/28/2015  CLINICAL DATA:  Right upper quadrant pain and epigastric pain radiating to the back. EXAM: ABDOMEN ULTRASOUND COMPLETE COMPARISON:  None. FINDINGS: Gallbladder: No gallstones or wall thickening visualized. No sonographic Murphy sign noted by sonographer. Common bile duct: Diameter: 5.1 mm Liver: Mild increased parenchymal echogenicity. Small lesion in the right lobe measuring 6 x 5 x 8 mm. This is hypoechoic, possibly a cyst, but not fully characterized. IVC: No abnormality visualized. Pancreas: Visualized portion unremarkable. Spleen: Size and appearance within normal limits. Right Kidney: Length: 10.8 cm. Echogenicity within normal limits. No mass or hydronephrosis visualized. Left Kidney: Length: 11.3 cm. Echogenicity within normal limits. No mass or hydronephrosis visualized. Abdominal aorta: No aneurysm visualized. Other findings: None. IMPRESSION: 1. No acute findings. No findings to account for this patient's symptoms. Normal gallbladder with no bile duct dilation. 2. Mild diffuse hepatic steatosis. Probable small right lobe cyst measuring 8 mm. 3. No other abnormalities. Electronically Signed   By: DLajean ManesM.D.   On: 08/28/2015 19:10  Ct Abdomen Pelvis W Contrast  08/28/2015  CLINICAL DATA:  65 year old with right upper quadrant pain. Nausea and vomiting. Current smoker. EXAM: CT ABDOMEN AND PELVIS WITH CONTRAST TECHNIQUE: Multidetector CT imaging of the abdomen and pelvis was performed using the standard protocol following bolus administration of intravenous contrast. CONTRAST:  171m ISOVUE-300 IOPAMIDOL (ISOVUE-300) INJECTION 61% COMPARISON:  Ultrasound 08/28/2015 FINDINGS: Lower chest: Punctate linear density in the right middle lobe on sequence 4, image 3 is nonspecific. Otherwise, the lung bases are clear. No pleural effusions. Hepatobiliary: Punctate low-density structure in the right hepatic lobe on sequence 2, image 25 is too small to  definitively characterize but could represent a small cyst. Otherwise, normal appearance of the liver and gallbladder. No biliary dilatation. Main portal venous system is patent. Pancreas: Normal appearance of the pancreas without inflammation or duct dilatation. Spleen: Normal appearance of spleen without enlargement. Adrenals/Urinary Tract: Normal adrenal glands. Normal appearance of both kidneys and urinary bladder. No hydronephrosis. There is a small calcification in the left kidney interpolar region but unclear if this represents a a nonobstructive stone or vascular calcification. Stomach/Bowel: Normal appearance of the stomach. There is possibly an ulceration involving the medial aspect of the proximal duodenum seen on sequence 2, image 27 and sequence 6, image 9. Subtle haziness in the fat surrounding this area. Normal appearance of small bowel without obstruction. Normal appearance of the appendix. Vascular/Lymphatic: No suspicious lymphadenopathy in the abdomen or pelvis. There is extensive atherosclerotic disease involving the infrarenal abdominal aorta with circumferential atherosclerotic plaque. The infrarenal abdominal aorta measures up to 2.3 cm. 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 The left external iliac artery is atretic and there is reconstitution of the left common femoral artery. Right common iliac artery and right external iliac artery appear to be patent but there is a near occlusion or high-grade stenosis in the right common femoral artery. There is flow in the proximal right femoral arteries. Limited evaluation for thrombus within the iliac veins due to mixing of blood and contrast. The celiac trunk and SMA are patent but there is plaque in the proximal SMA. Main bilateral renal arteries are patent but poorly characterized on this examination. Reproductive: No gross abnormality to the prostate or seminal vesicles. Other: No free fluid.  No free air.  Musculoskeletal: No acute bone abnormality. IMPRESSION: Possible duodenal ulcer along the medial aspect of the proximal duodenum. Subtle haziness in the adjacent fat. Recommend clinical correlation in this area and better characterized with endoscopy. Extensive atherosclerotic disease involving the aorta and iliac arteries. 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 In addition, there appears to be severe stenosis in the right common femoral artery. These vascular findings could be associated with claudication. There is a punctate 3 mm nodule in the right middle lobe which is nonspecific. Reportedly, the patient is a current smoker. Recommend follow-up based on Fleischner criteria: Non-contrast chest CT can be considered in 12 months if patient is high-risk. This recommendation follows the consensus statement: Guidelines for Management of Incidental Pulmonary Nodules Detected on CT Images:From the Fleischner Society 2017; published online before print ([id number]. Electronically Signed   By: AMarkus DaftM.D.   On: 08/28/2015 20:33   Ct Angio Ao+bifem W/cm &/or Wo/cm  09/20/2015  ADDENDUM REPORT: 09/20/2015 13:18 ADDENDUM: Addendum created to address the title of the exam. EXAM: CT ANGIOGRAPHY ABDOMEN PELVIS AND RUNOFF WITH CONTRAST Electronically Signed  By: Corrie Mckusick D.O.   On: 09/20/2015 13:18  09/20/2015  CLINICAL DATA:  65 year old male with a history of numbness right leg. EXAM: CT ABDOMEN AND PELVIS WITH CONTRAST TECHNIQUE: Multidetector CT imaging of the abdomen and pelvis was performed using the standard protocol following bolus administration of intravenous contrast. CONTRAST:  125 cc Isovue 370 COMPARISON:  CT 08/28/2015 FINDINGS: Lower chest: Unremarkable appearance of the soft tissues of the chest wall. Heart size within normal limits.  No pericardial fluid/thickening. No lower mediastinal adenopathy. Unremarkable appearance  of the distal esophagus. No hiatal hernia. No confluent airspace disease, pleural fluid, or pneumothorax within visualized lung. 3 mm nodule of the right middle lobe is again present. Abdomen/pelvis: Nonvascular: Unremarkable appearance of liver and spleen. Unremarkable appearance of bilateral adrenal glands. Unremarkable appearance of the pancreas. Significantly improved inflammatory changes of the pancreaticoduodenal groove. Unremarkable appearance of the gallbladder. No radiopaque cholelithiasis. No free air or significant free fluid. No abnormally distended small bowel or colon. Colonic diverticula without evidence of associated inflammatory changes. Small formed stool burden. Normal appendix with high density material within the lumen, potentially inspissated secretions/concretions. Unremarkable appearance of the bilateral kidneys with no hydronephrosis or nephrolithiasis. Unremarkable appearance of the course of the bilateral ureters. Unremarkable appearance of the urinary bladder. Unremarkable prostate. Vascular: Aorta: Mixed calcified and soft plaque of the abdominal aorta, with the preponderance of disease in the infrarenal abdominal aorta where there is circumferential disease. No aneurysm or dissection flap. No periaortic fluid. No periaortic inflammatory changes. Mesenteric vasculature: Celiac artery patent, with atherosclerotic changes at the origin. There is suggestion of 50% narrowing at the origin. Superior mesenteric artery is patent at the origin with atherosclerotic changes. Atherosclerotic plaque at the origin the right will renal artery with perhaps 50% stenosis. Atherosclerotic changes at the origin of the left renal artery with perhaps 50% stenosis. Inferior mesenteric artery appears to maintain patency at the origin. Right lower extremity: Right common iliac artery and external iliac artery are patent with atherosclerotic changes. No aneurysm or dissection flap. Right hypogastric artery is  occluded at the origin. As was demonstrated on prior CT, there is occlusion of the common femoral artery, with reconstitution of the proximal superficial femoral artery and profunda femoris via collateral flow. The remainder of the length of the superficial femoral artery maintains patency, with partially calcified plaque within the adductor canal. Popliteal artery is patent to the trifurcation. Proximal anterior tibial artery demonstrates atherosclerotic disease though appears patent proximally. Tibioperoneal trunk appears patent. Proximal peroneal artery and posterior tibial artery appear patent. No contrast identified within the distal anterior tibial artery or peroneal artery, although this may be a consequence of early image acquisition. No delay was performed. Posterior tibial artery appears to have contrast filling through the length to the malleolus. 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 Hypogastric artery is occluded at the origin. Reconstitution of the common femoral artery via collateral flow from inferior epigastric artery. Profunda femoris and left superficial femoral artery are patent. Superficial femoral artery is patent throughout its course with atherosclerotic plaque at the adductor canal. Popliteal artery is patent to the trifurcation. Anterior tibial artery demonstrates atherosclerotic plaque at the origin though is patent proximally. Tibioperoneal trunk is patent. Peroneal artery and posterior tibial artery are patent proximally. No contrast identified within the distal peroneal artery and anterior tibial artery, although this may be a consequence of early image acquisition. No delay was performed. Posterior  tibial artery appears to have contrast filling through the length of the malleolus. Musculoskeletal: No displaced fracture. No bony canal narrowing. Degenerative disc disease of the lower thoracic and lumbar  spine, similar to the comparison CT. No new evidence of disc bulge/herniation. IMPRESSION: Vascular: Aortic atherosclerosis and iliac disease. 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 Right femoral popliteal disease. 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 Right infrapopliteal disease, with patency of proximal AT, TP trunk, proximal peroneal artery, and proximal PT maintained. The PT appears patent to the ankle, although there is absence of contrast at the distal right AT and peroneal artery. This may represent either imaging before arrival of the contrast bolus or alternatively occlusion. Left femoral popliteal disease. Mild mixed calcified and soft plaque of the left superficial femoral artery without occlusion. Popliteal artery patent to the trifurcation. Left infrapopliteal disease, with patency of proximal AT, TP trunk, proximal peroneal artery, and proximal PT maintained. The PT appears patent to the ankle, although there is absence of contrast at the distal right AT and peroneal artery. This may represent either imaging before arrival of the contrast bolus or alternatively occlusion. These results were discussed by telephone at the time of interpretation on 09/19/2015 at 3:25 pm with Dr. Annamarie Major of Vascular Surgery Service. Mesenteric and bilateral renal artery atherosclerotic changes, as above. Nonvascular: Improved inflammatory changes near the duodenum in this patient with a history of duodenal ulcer. Re- demonstration of right middle lobe pulmonary nodule. Repeat noncontrast chest CT in June 2018 (1 year after the most most recent CT) is again recommended. Signed, Dulcy Fanny.  Earleen Newport, DO [facility] Electronically Signed: By: Corrie Mckusick D.O. On: 09/19/2015 15:29    EKG: Sinus rhythm  ASSESSMENT AND PLAN:   1. Sinus rhythm, not atrial fibrillation 2. Peripheral vascular disease, with right common femoral artery occlusion  Recommendations  1. Review 2-D echocardiogram 2. No specific treatment for sinus rhythm  Sinus for now, please call if any questions  Signed: Rykar Lebleu MD,PhD, [facility] 09/20/2015, 6:02 PM

## 2015-09-20 NOTE — Progress Notes (Addendum)
ANTICOAGULATION CONSULT NOTE - Initial Consult  Pharmacy Consult for heparin Indication: limb ischemia  No Known Allergies  Patient Measurements: Height: _0  (185.4 cm) Weight: 195 lb (88.451 kg) IBW/kg (Calculated) : 79.9 Heparin Dosing Weight: 88 kg  Vital Signs: Temp: 97.7 F (36.5 C) (07/04 2050) Temp Source: Oral (07/04 2050) BP: 161/69 mmHg (07/04 2050) Pulse Rate: 68 (07/04 2050)  Labs:  Recent Labs  09/19/15 1233 09/20/15 0007  HGB 15.2 14.4  HCT 43.1 41.5  PLT 194 173  APTT 29  --   LABPROT 14.2  --   INR 1.08  --   HEPARINUNFRC <0.10* 0.57  CREATININE 1.03 1.13  CKTOTAL 313  --    Estimated Creatinine Clearance: 74.6 mL/min (by C-G formula based on Cr of 1.13).  Medical History: Past Medical History  Diagnosis Date  . Wears dentures     full upper and lower  . Leg weakness, bilateral     s/p lumbar surgery  . Arthritis     lower back  . GERD (gastroesophageal reflux disease)    Assessment: Pharmacy consulted to dose and monitor heparin drip in this 65 year old male for limb ischemia. Patient was not taking anticoagulants prior to admission.   Baseline labs:  Hgb 15.2 Plt 194  INR 1.08 APTT 29  Goal of Therapy:  Heparin level 0.3-0.7 units/ml Monitor platelets by anticoagulation protocol: Yes   Plan:  No bolus per MD Start heparin infusion at 1400 units/hr Check anti-Xa level in 6 hours and daily while on heparin Continue to monitor H&H and platelets  7/5:  HL @ 00:00 = 0.57  Will continue this pt on current rate of 1400 units/hr and recheck HL on 7/5 @ 6:00.   7/5: HL @ 06:00 = 0.57 Will continue this pt on current rate and recheck HL on 7/6 with AM labs.   Orene Desanctis, PharmD Clinical Pharmacist 09/20/2015,1:13 AM

## 2015-09-20 NOTE — Progress Notes (Signed)
*  PRELIMINARY RESULTS* Echocardiogram 2D Echocardiogram has been performed.  Gary Mccoy 09/20/2015, 8:40 AM

## 2015-09-20 NOTE — Care Management (Signed)
Admitted to [facility] with the diagnosis of weakness right lower extremity. Lives with wife, Malachy Mood, [phone]). Dr Emily Filbert is primary care physician. Takes care of all basic and instrumental activities of daily living himself.  EGD done 09/04/15. Vascular consult in progress. Shelbie Ammons RN MSN CCM Care Management [phone]

## 2015-09-21 ENCOUNTER — Encounter: Payer: Self-pay | Admitting: Anesthesiology

## 2015-09-21 ENCOUNTER — Inpatient Hospital Stay: Payer: Managed Care, Other (non HMO)

## 2015-09-21 DIAGNOSIS — I639 Cerebral infarction, unspecified: Secondary | ICD-10-CM

## 2015-09-21 LAB — ECHOCARDIOGRAM COMPLETE
AV Area VTI: 2.63 cm2
AV pk vel: 92.2 cm/s
AVPG: 3 mmHg
Ao pk vel: 0.84 m/s
CHL CUP AV PEAK INDEX: 1.23
CHL CUP MV DEC (S): 194
E/e' ratio: 6.92
EWDT: 194 ms
FS: 35 % (ref 28–44)
Height: 73 in
IV/PV OW: 0.9
LA diam index: 1.5 cm/m2
LA vol: 34.6 mL
LASIZE: 32 mm
LAVOLA4C: 47.4 mL
LAVOLIN: 16.2 mL/m2
LDCA: 3.14 cm2
LEFT ATRIUM END SYS DIAM: 32 mm
LV E/e' medial: 6.92
LV PW d: 13.4 mm — AB (ref 0.6–1.1)
LVEEAVG: 6.92
LVELAT: 10.3 cm/s
LVOTD: 20 mm
LVOTPV: 77.1 cm/s
MV Peak grad: 2 mmHg
MV pk E vel: 71.3 m/s
MVPKAVEL: 62.1 m/s
TDI e' lateral: 10.3
TDI e' medial: 4.57
Weight: 3120 oz

## 2015-09-21 LAB — HEPARIN LEVEL (UNFRACTIONATED): HEPARIN UNFRACTIONATED: 0.6 [IU]/mL (ref 0.30–0.70)

## 2015-09-21 MED ORDER — DOCUSATE SODIUM 100 MG PO CAPS: 200.0000 mg | ORAL_CAPSULE | Freq: Two times a day (BID) | ORAL | Status: DC | PRN

## 2015-09-21 NOTE — Progress Notes (Signed)
ANTICOAGULATION CONSULT NOTE - FOLLOW UP   Pharmacy Consult for heparin Indication: limb ischemia  No Known Allergies  Patient Measurements: Height: 6' 1" (185.4 cm) Weight: 195 lb (88.451 kg) IBW/kg (Calculated) : 79.9 Heparin Dosing Weight: 88 kg  Vital Signs: Temp: 98.1 F (36.7 C) (07/06 0436) Temp Source: Oral (07/06 0436) BP: 158/81 mmHg (07/06 0436) Pulse Rate: 59 (07/06 0436)  Labs:  Recent Labs  09/19/15 1233 09/20/15 0007 09/20/15 0554 09/21/15 0632  HGB 15.2 14.4  --   --   HCT 43.1 41.5  --   --   PLT 194 173  --   --   APTT 29  --   --   --   LABPROT 14.2  --   --   --   INR 1.08  --   --   --   HEPARINUNFRC <0.10* 0.57 0.57 0.60  CREATININE 1.03 1.13  --   --   CKTOTAL 313  --   --   --    Estimated Creatinine Clearance: 74.6 mL/min (by C-G formula based on Cr of 1.13).  Medical History: Past Medical History  Diagnosis Date  . Wears dentures     full upper and lower  . Leg weakness, bilateral     s/p lumbar surgery  . Arthritis     lower back  . GERD (gastroesophageal reflux disease)    Assessment: Pharmacy consulted to dose and monitor heparin drip in this 65 year old male for limb ischemia. Patient was not taking anticoagulants prior to admission.   Baseline labs:  Hgb 15.2 Plt 194  INR 1.08 APTT 29  Goal of Therapy:  Heparin level 0.3-0.7 units/ml Monitor platelets by anticoagulation protocol: Yes   Plan:  No bolus per MD Start heparin infusion at 1400 units/hr Check anti-Xa level in 6 hours and daily while on heparin Continue to monitor H&H and platelets  7/5:  HL @ 00:00 = 0.57  Will continue this pt on current rate of 1400 units/hr and recheck HL on 7/5 @ 6:00.   7/5: HL @ 06:00 = 0.57 Will continue this pt on current rate and recheck HL on 7/6 with AM labs.   7/6: HL @ ~06:00 = 0.60; therefore will continue current heparin gtt rate. Will recheck heparin level tomorrow morning.   Larene Beach, PharmD Clinical  Pharmacist 09/21/2015,9:25 AM

## 2015-09-21 NOTE — Progress Notes (Signed)
For TEE in am.

## 2015-09-21 NOTE — Progress Notes (Signed)
Pt transported to [facility]

## 2015-09-21 NOTE — Progress Notes (Signed)
Subjective: Patient stable with no further development of neurological symptoms.    Objective: Current vital signs: BP 158/81 mmHg  Pulse 59  Temp(Src) 98.1 F (36.7 C) (Oral)  Resp 18  Ht 6' 1" (1.854 m)  Wt 88.451 kg (195 lb)  BMI 25.73 kg/m2  SpO2 100% Vital signs in last 24 hours: Temp:  [98 F (36.7 C)-99 F (37.2 C)] 98.1 F (36.7 C) (07/06 0436) Pulse Rate:  [59-71] 59 (07/06 0436) Resp:  [18-20] 18 (07/06 0436) BP: (139-158)/(77-84) 158/81 mmHg (07/06 0436) SpO2:  [97 %-100 %] 100 % (07/06 0436)  Intake/Output from previous day: 07/05 0701 - 07/06 0700 In: 2455.1 [P.O.:240; I.V.:2215.1] Out: 2475 [Urine:2475] Intake/Output this shift: Total I/O In: 240 [P.O.:240] Out: 400 [Urine:400] Nutritional status: Diet Heart Room service appropriate?: Yes; Fluid consistency:: Thin  Neurologic Exam: Mental Status: Alert, oriented, thought content appropriate. Speech fluent without evidence of aphasia. Able to follow 3 step commands without difficulty. Cranial Nerves: II: Discs flat bilaterally; Visual fields grossly normal, pupils equal, round, reactive to light and accommodation III,IV, VI: ptosis not present, extra-ocular motions intact bilaterally V,VII: smile symmetric, facial light touch sensation normal bilaterally VIII: hearing normal bilaterally IX,X: gag reflex present XI: bilateral shoulder shrug XII: midline tongue extension Motor: Right :Upper extremity 5/5Left: Upper extremity 5/5 5/5 in the LLE. RLE 5/5 hip flexor, quads and hamstrings, hip adductors and hip abductors. 0/5 plantar flexion and extension and foot inversion and eversion but foot held at 90 degrees.  Sensory: Pinprick and light touch decreased circumferentially from just below the knee downward and including the entire foot.    Lab Results: Basic Metabolic Panel:  Recent Labs Lab 09/19/15 1233 09/20/15 0007  NA 139 142  K 3.8 3.9   CL 108 109  CO2 23 24  GLUCOSE 110* 93  BUN 11 12  CREATININE 1.03 1.13  CALCIUM 9.1 8.9    Liver Function Tests:  Recent Labs Lab 09/19/15 1233  AST 31  ALT 27  ALKPHOS 68  BILITOT 1.2  PROT 7.0  ALBUMIN 4.1   No results for input(s): LIPASE, AMYLASE in the last 168 hours. No results for input(s): AMMONIA in the last 168 hours.  CBC:  Recent Labs Lab 09/19/15 1233 09/20/15 0007  WBC 13.2* 9.0  NEUTROABS 10.3*  --   HGB 15.2 14.4  HCT 43.1 41.5  MCV 87.6 88.8  PLT 194 173    Cardiac Enzymes:  Recent Labs Lab 09/19/15 1233  CKTOTAL 313    Lipid Panel:  Recent Labs Lab 09/20/15 0007  CHOL 175  TRIG 98  HDL 39*  CHOLHDL 4.5  VLDL 20  LDLCALC 116*    CBG: No results for input(s): GLUCAP in the last 168 hours.  Microbiology: No results found for this or any previous visit.  Coagulation Studies:  Recent Labs  09/19/15 1233  LABPROT 14.2  INR 1.08    Imaging: [name] Contrast  09/20/2015  CLINICAL DATA:  65 year old male with right lower extremity numbness and weakness. Possible DVT. Difficulty walking. Initial encounter. EXAM: MRI HEAD WITHOUT CONTRAST TECHNIQUE: Multiplanar, multiecho pulse sequences of the brain and surrounding structures were obtained without intravenous contrast. COMPARISON:  Brain MRI 08/08/2010, head CT 07/12/2010 FINDINGS: Major intracranial vascular flow voids are stable. However, there are patchy acute cortically based infarcts in the bilateral superior frontal gyri, most confluent posteriorly at the medial left motor and sensory strip (right lower extremity representation area). The pattern in the frontal lobes is most  suggestive of bilateral ACA territory ischemia, however, there is also two left occipital lobe small cortical infarcts (series 100, image 29). No posterior fossa restricted diffusion. No deep gray matter or temporal lobe restricted diffusion. Mild cytotoxic edema in the superior frontal lobes as seen  on series 8, image 21. No associated hemorrhage or mass effect. There is new cortical encephalomalacia in the inferior left parietal and superior left occipital lobes since 2012 in keeping with remote left PCA infarct (series 7, image 11). Multiple chronic infarcts in the cerebellum, primarily the left hemisphere, appear stable since 2012. Mildly increased patchy T2 hyperintensity in the pons. Elsewhere mild for age chronic nonspecific cerebral white matter signal has mildly progressed since 2012. No midline shift, mass effect, evidence of mass lesion, ventriculomegaly, extra-axial collection. Cervicomedullary junction and pituitary are within normal limits. No chronic cerebral blood products identified. Visible internal auditory structures appear normal. Mastoids are clear. Mild paranasal sinus mucosal thickening has not significantly changed. Negative orbit and scalp soft tissues. Normal bone marrow signal. Negative visualized cervical spine. IMPRESSION: 1. Scattered acute mostly cortically based infarcts in the superior frontal gyri a left greater than right, suggesting bilateral ACA territory ischemia. No associated hemorrhage or mass effect. 2. Superimposed acute on chronic ischemia in the left posterior MCA/PCA territory which is new since 2012. 3. 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 4. Mildly progressed signal changes in the cerebral white matter and pons favored to be small vessel disease related. Electronically Signed   By: Genevie Ann M.D.   On: 09/20/2015 12:47   Mr Lumbar Spine W Wo Contrast  09/20/2015  CLINICAL DATA:  Right lower extremity weakness, numbness and difficulty walking. EXAM: MRI LUMBAR SPINE WITHOUT AND WITH CONTRAST TECHNIQUE: Multiplanar and multiecho pulse sequences of the lumbar spine were obtained without and with intravenous contrast. CONTRAST:  38m MULTIHANCE GADOBENATE DIMEGLUMINE 529 MG/ML IV SOLN COMPARISON:  Lumbar MRI 03/26/2014. The  abdominal CTA runoff 09/19/2015. FINDINGS: Segmentation: Conventional anatomy assumed, with the last open disc space designated L5-S1. Alignment:  Normal. Vertebrae: No worrisome osseous lesion, acute fracture or pars defect. 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 There are mild sacroiliac degenerative changes, worse on the right. Conus medullaris: Extends to the T12-L1 level and appears normal. Paraspinal and other soft tissues: No significant paraspinal findings. Extensive aortoiliac atherosclerosis with known occlusion of the left common iliac artery. Disc levels: L1-2: Mild disc bulging with anterior osteophytes. No spinal stenosis or nerve root encroachment. L2-3: Mild disc bulging. No spinal stenosis or nerve root encroachment. L3-4: Minimal disc bulging with mild facet and ligamentous hypertrophy. Stable minimal narrowing of the lateral recesses. No nerve root encroachment. L4-5: Stable postsurgical changes status post bilateral laminectomy. The spinal canal is adequately decompressed. There is a small disc protrusion in the right subarticular zone, contributing to asymmetric narrowing of the right lateral recess and potential right L5 nerve root encroachment. There is mild facet hypertrophy and mild right foraminal narrowing. L5-S1: Stable disc bulging, endplate osteophytes and bilateral facet hypertrophy. There is stable mild narrowing of the lateral recesses and foramina bilaterally. IMPRESSION: 1. A disc protrusion in the right subarticular zone at L4-5 appears slightly larger, contributing to asymmetric narrowing of the right lateral recess and possible right L5 nerve root encroachment. Mild right foraminal narrowing at that level appears unchanged. 2. Stable chronic spondylosis and facet hypertrophy at L5-S1, contributing to mild narrowing of the lateral recesses and foramina bilaterally. 3. Aortic atherosclerosis  with known occlusion of the left common iliac artery.  Electronically Signed   By: Richardean Sale M.D.   On: 09/20/2015 12:47   Ct Angio Ao+bifem W/cm &/or Wo/cm  09/20/2015  ADDENDUM REPORT: 09/20/2015 13:18 ADDENDUM: Addendum created to address the title of the exam. EXAM: CT ANGIOGRAPHY ABDOMEN PELVIS AND RUNOFF WITH CONTRAST Electronically Signed   By: Corrie Mckusick D.O.   On: 09/20/2015 13:18  09/20/2015  CLINICAL DATA:  65 year old male with a history of numbness right leg. EXAM: CT ABDOMEN AND PELVIS WITH CONTRAST TECHNIQUE: Multidetector CT imaging of the abdomen and pelvis was performed using the standard protocol following bolus administration of intravenous contrast. CONTRAST:  125 cc Isovue 370 COMPARISON:  CT 08/28/2015 FINDINGS: Lower chest: Unremarkable appearance of the soft tissues of the chest wall. Heart size within normal limits.  No pericardial fluid/thickening. No lower mediastinal adenopathy. Unremarkable appearance of the distal esophagus. No hiatal hernia. No confluent airspace disease, pleural fluid, or pneumothorax within visualized lung. 3 mm nodule of the right middle lobe is again present. Abdomen/pelvis: Nonvascular: Unremarkable appearance of liver and spleen. Unremarkable appearance of bilateral adrenal glands. Unremarkable appearance of the pancreas. Significantly improved inflammatory changes of the pancreaticoduodenal groove. Unremarkable appearance of the gallbladder. No radiopaque cholelithiasis. No free air or significant free fluid. No abnormally distended small bowel or colon. Colonic diverticula without evidence of associated inflammatory changes. Small formed stool burden. Normal appendix with high density material within the lumen, potentially inspissated secretions/concretions. Unremarkable appearance of the bilateral kidneys with no hydronephrosis or nephrolithiasis. Unremarkable appearance of the course of the bilateral ureters. Unremarkable appearance of the urinary bladder. Unremarkable prostate. Vascular: Aorta: Mixed  calcified and soft plaque of the abdominal aorta, with the preponderance of disease in the infrarenal abdominal aorta where there is circumferential disease. No aneurysm or dissection flap. No periaortic fluid. No periaortic inflammatory changes. Mesenteric vasculature: Celiac artery patent, with atherosclerotic changes at the origin. There is suggestion of 50% narrowing at the origin. Superior mesenteric artery is patent at the origin with atherosclerotic changes. Atherosclerotic plaque at the origin the right will renal artery with perhaps 50% stenosis. Atherosclerotic changes at the origin of the left renal artery with perhaps 50% stenosis. Inferior mesenteric artery appears to maintain patency at the origin. Right lower extremity: Right common iliac artery and external iliac artery are patent with atherosclerotic changes. No aneurysm or dissection flap. 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 As was demonstrated on prior CT, there is occlusion of the common femoral artery, with reconstitution of the proximal superficial femoral artery and profunda femoris via collateral flow. The remainder of the length of the superficial femoral artery maintains patency, with partially calcified plaque within the adductor canal. Popliteal artery is patent to the trifurcation. Proximal anterior tibial artery demonstrates atherosclerotic disease though appears patent proximally. Tibioperoneal trunk appears patent. Proximal peroneal artery and posterior tibial artery appear patent. No contrast identified within the distal anterior tibial artery or peroneal artery, although this may be a consequence of early image acquisition. No delay was performed. Posterior tibial artery appears to have contrast filling through the length to the malleolus. 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 Hypogastric artery is occluded at the origin. Reconstitution  of the common femoral artery via collateral flow from inferior epigastric artery. Profunda femoris and left superficial femoral artery are patent. Superficial femoral artery is patent throughout its course with atherosclerotic plaque at the  adductor canal. Popliteal artery is patent to the trifurcation. Anterior tibial artery demonstrates atherosclerotic plaque at the origin though is patent proximally. Tibioperoneal trunk is patent. Peroneal artery and posterior tibial artery are patent proximally. No contrast identified within the distal peroneal artery and anterior tibial artery, although this may be a consequence of early image acquisition. No delay was performed. Posterior tibial artery appears to have contrast filling through the length of the malleolus. Musculoskeletal: No displaced fracture. No bony canal narrowing. Degenerative disc disease of the lower thoracic and lumbar spine, similar to the comparison CT. No new evidence of disc bulge/herniation. IMPRESSION: Vascular: Aortic atherosclerosis and iliac disease. 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 Right femoral popliteal disease. 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 Right infrapopliteal disease, with patency of proximal AT, TP trunk, proximal peroneal artery, and proximal PT maintained. The PT appears patent to the ankle, although there is absence of contrast at the distal right AT and peroneal artery. This may represent either imaging before arrival of the contrast bolus or alternatively occlusion. Left femoral popliteal disease. Mild mixed calcified and soft plaque of the left superficial femoral artery without  occlusion. Popliteal artery patent to the trifurcation. Left infrapopliteal disease, with patency of proximal AT, TP trunk, proximal peroneal artery, and proximal PT maintained. The PT appears patent to the ankle, although there is absence of contrast at the distal right AT and peroneal artery. This may represent either imaging before arrival of the contrast bolus or alternatively occlusion. These results were discussed by telephone at the time of interpretation on 09/19/2015 at 3:25 pm with Dr. Annamarie Major of Vascular Surgery Service. Mesenteric and bilateral renal artery atherosclerotic changes, as above. Nonvascular: Improved inflammatory changes near the duodenum in this patient with a history of duodenal ulcer. Re- demonstration of right middle lobe pulmonary nodule. Repeat noncontrast chest CT in June 2018 (1 year after the most most recent CT) is again recommended. Signed, Dulcy Fanny. Earleen Newport, DO [facility] Electronically Signed: By: Corrie Mckusick D.O. On: 09/19/2015 15:29    Medications:  I have reviewed the patient's current medications. Scheduled: . aspirin EC  325 mg Oral Daily  . atorvastatin  40 mg Oral q1800  . nicotine  21 mg Transdermal Daily  . pantoprazole  40 mg Oral Daily  . sodium chloride flush  3 mL Intravenous Q12H    Assessment/Plan: Neurological examination is stable.  MRI of the brain personally reviewed and shows multiple small acute infarcts bilaterally.  Likely cause of RLE symptoms.  Suspect embolic etiology.  Initial EKG with atrial fibrillation.  Echocardiogram shows no cardiac source of emboli with an EF of 55-65%.  A1c 6.1, LDL 116.  Patient currently on heparin.    Recommendations: 1.  Carotid dopplers 2.  Agressive lipid management with target LDL<70. 3.  Initiation of oral anticoagulation 4.  PT/OT evaluation 5.  Smoking cessation discussed   LOS: 2 days   Alexis Goodell,  MD Neurology [phone] 09/21/2015  12:17 PM

## 2015-09-21 NOTE — Anesthesia Preprocedure Evaluation (Deleted)
Anesthesia Evaluation    Airway        Dental   Pulmonary asthma , Current Smoker,           Cardiovascular hypertension, Pt. on medications + Peripheral Vascular Disease  + dysrhythmias Atrial Fibrillation      Neuro/Psych CVA, Residual Symptoms    GI/Hepatic PUD, GERD  Medicated,  Endo/Other    Renal/GU      Musculoskeletal  (+) Arthritis ,   Abdominal   Peds  Hematology   Anesthesia Other Findings   Reproductive/Obstetrics                             Anesthesia Physical Anesthesia Plan Anesthesia Quick Evaluation

## 2015-09-21 NOTE — Progress Notes (Signed)
[facility]  Daily Progress Note   Subjective  -  Patient notes that he was able to flex his calf slightly this is an improvement. Once again he denies any new neurological symptoms.  Objective Filed Vitals:   09/20/15 1344 09/20/15 2032 09/21/15 0436 09/21/15 1544  BP: 139/77 152/84 158/81 109/64  Pulse: 59 71 59 70  Temp: 98 F (36.7 C) 99 F (37.2 C) 98.1 F (36.7 C) 97.8 F (36.6 C)  TempSrc: Oral Oral Oral Oral  Resp: _0 Height:      Weight:      SpO2: 99% 97% 100% 100%    Intake/Output Summary (Last 24 hours) at 09/21/15 2021 Last data filed at 09/21/15 2000  Gross per 24 hour  Intake 1281.08 ml  Output   1700 ml  Net -418.92 ml    PULM  Normal effort , no use of accessory muscles CV  No JVD, RRR Abd      No distended, nontender VASC  Feet remained pink and appear well perfused with 1 second capillary refill. Pedal pulses are not palpable area no ulcerations  Laboratory CBC    Component Value Date/Time   WBC 9.0 09/20/2015 0007   HGB 14.4 09/20/2015 0007   HCT 41.5 09/20/2015 0007   PLT 173 09/20/2015 0007    BMET    Component Value Date/Time   NA 142 09/20/2015 0007   K 3.9 09/20/2015 0007   CL 109 09/20/2015 0007   CO2 24 09/20/2015 0007   GLUCOSE 93 09/20/2015 0007   BUN 12 09/20/2015 0007   CREATININE 1.13 09/20/2015 0007   CALCIUM 8.9 09/20/2015 0007   GFRNONAA >60 09/20/2015 0007   GFRAA >60 09/20/2015 0007    Assessment/Planning:    Multiple acute cerebral infarcts more numerous on the left compared to the right but also located in the cerebellar region: I am concerned that the nature of the multiple infarcts suggests a cardiac source. Cardiology states the patient has been in sinus rhythm. 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 I do not believe that he has had a significant change in his vascular status to cause these  symptoms. His physical examination is unchanged from the preevent status and his past CTs do not show any difference compared to the latest CT angiography. Currently he is on heparin he will require antiplatelet therapy.I spoke with Dr. 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 I will follow up as an outpatient with him and further discussions regarding aortic and iliac reconstruction will be undertaken. But in the meantime given his acute strokes no nonemergent intervention will be performed for approximately 8 weeks.  Lumbar sacral spine disease: MRI today does not show any cord-related injury. His previous laminectomy site appears to be adequate although there is likely herniation at L4-5 with impingement on the right nerve root this also could be adding to his right lower extremity symptoms. I would defer further evaluation to the spine service which  can be done as an outpatient.    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 This would make the infarcts on the left extremely unlikely as this is a stable situation which is unlikely to allow emboli to reach the brain. At this point he will require CT angiogram of the carotids which I will attempt to obtain after his TEE tomorrow  Katha Cabal  09/21/2015, 8:21 PM

## 2015-09-21 NOTE — Progress Notes (Addendum)
Physical Therapy Treatment Patient Details Name: Gary Mccoy. MRN: [id number] DOB: [birth year] Today's Date: 09/21/2015    History of Present Illness 65 yo M presented to ED for R leg numbness and decreased muscle control. He was admitted for weakness and being treated for possible DVT and further work up is in progress. He was in the hospital ~ 1 month ago for a duodenal ulcer. CT was performed and showed occluded L connon and external iliac artery and R femoral artery. PMH includes back surgery, EGD 09/04/15.    PT Comments    Pt gradually progressing towards functional goals. He was able to ambulate much better and more stable using SPC. Pt had difficulty with sequence but was able to correct with cues. Patient's MRI confirms acute embolic CVA. Due to this being the cause of symptoms, f/u recommendations changed to outpatient PT to address deficits of strength, balance and gait to progress towards PLOF. Plan to progress mobility as tolerated.  Follow Up Recommendations  Outpatient PT     Equipment Recommendations  Cane    Recommendations for Other Services       Precautions / Restrictions Precautions Precautions: None Restrictions Weight Bearing Restrictions: No    Mobility  Bed Mobility Overal bed mobility: Independent             General bed mobility comments: no difficulties  Transfers Overall transfer level: Modified independent Equipment used: None             General transfer comment: no difficulties, extra time required for safely placing R foot  Ambulation/Gait Ambulation/Gait assistance: Min guard Ambulation Distance (Feet): 220 Feet Assistive device: Straight cane Gait Pattern/deviations: Step-to pattern Gait velocity: reduced Gait velocity interpretation: Below normal speed for age/gender General Gait Details: Generally steady. Extra time required for placing R foot safely due to decreased sensation and muscle control of ankle.     Stairs            Wheelchair Mobility    Modified Rankin (Stroke Patients Only)       Balance Overall balance assessment: Modified Independent                                  Cognition Arousal/Alertness: Awake/alert Behavior During Therapy: WFL for tasks assessed/performed Overall Cognitive Status: Within Functional Limits for tasks assessed                      Exercises Other Exercises Other Exercises: Pt ambulated 265f with SPC with min guard. Mostly steady with occasional LOB when R foot catches due to decreased sensation and motor function below R knee. Pt able to self correct. Cues for safety. Cues for proper sequence with SPC in L hand with R foot.    General Comments        Pertinent Vitals/Pain Pain Assessment: No/denies pain    Home Living Family/patient expects to be discharged to:: Private residence Living Arrangements: Spouse/significant other Available Help at Discharge: Family Type of Home: House Home Access: Stairs to enter Entrance Stairs-Rails: Can reach both Home Layout: One level Home Equipment: None      Prior Function Level of Independence: Independent      Comments: Pt independent with all ADLs, works as a tAdministrator   PT Goals (current goals can now be found in the care plan section) Acute Rehab PT Goals Patient Stated Goal: To be independent with ADLs.  PT Goal Formulation: With patient/family Time For Goal Achievement: 10/04/15 Potential to Achieve Goals: Good Progress towards PT goals: Progressing toward goals    Frequency  7X/week    PT Plan Discharge plan needs to be updated;Frequency needs to be updated    Co-evaluation             End of Session Equipment Utilized During Treatment: Gait belt Activity Tolerance: Patient tolerated treatment well Patient left: in bed;with call bell/phone within reach;with family/visitor present     Time: 1133-1150 PT Time Calculation (min) (ACUTE  ONLY): 17 min  Charges:  $Gait Training: 8-22 mins                    G Codes:      Neoma Laming, PT, DPT  09/21/2015, 12:40 PM [phone]

## 2015-09-21 NOTE — Evaluation (Signed)
Occupational Therapy Evaluation Patient Details Name: Gary Mccoy. MRN: [id number] DOB: [birth year] Today's Date: 09/21/2015    History of Present Illness 65 yo M presented to ED for R leg numbness and decreased muscle control. He was admitted for weakness and being treated for possible DVT and further work up is in progress. He was in the hospital ~ 1 month ago for a duodenal ulcer. CT was performed and showed occluded L connon and external iliac artery and R femoral artery. PMH includes back surgery, EGD 09/04/15.   Clinical Impression   Pt, is a 65 y.o. male who was admitted to [facility] for LE weakness, and a DVT. Pt. Presents with RLE weakness, and impaired motor control from the right knee down to the foot. Pt. Is independent with ADL tasks, and requires supervision for functional mobility during ADLs.  No further OT services are warranted at this time at this level of care. Pt. Is in agreement.     Follow Up Recommendations  No OT follow up    Equipment Recommendations    A Cane   Recommendations for Other Services       Precautions / Restrictions                                                    ADL Overall ADL's : Needs assistance/impaired Eating/Feeding: Independent   Grooming: Independent           Upper Body Dressing : Independent   Lower Body Dressing: Independent       Toileting- Clothing Manipulation and Hygiene: Independent       Functional mobility during ADLs: Supervision/safety       Vision     Perception     Praxis      Pertinent Vitals/Pain Pain Assessment: No/denies pain     Hand Dominance     Extremity/Trunk Assessment         Communication Communication Communication: No difficulties   Cognition Arousal/Alertness: Awake/alert Behavior During Therapy: WFL for tasks assessed/performed Overall Cognitive Status: Within Functional Limits for tasks assessed                     General  Comments       Exercises       Shoulder Instructions      Home Living Family/patient expects to be discharged to:: Private residence Living Arrangements: Spouse/significant other Available Help at Discharge: Family Type of Home: House Home Access: Stairs to enter Technical brewer of Steps: 3 Entrance Stairs-Rails: Can reach both Home Layout: One level     Bathroom Shower/Tub: Walk-in Hydrologist: Standard     Home Equipment: None          Prior Functioning/Environment Level of Independence: Independent        Comments: Pt independent with all ADLs, works as a Administrator.    OT Diagnosis: Generalized weakness   OT Problem List:     OT Treatment/Interventions:      OT Goals(Current goals can be found in the care plan section) Acute Rehab OT Goals Patient Stated Goal: To be independent with ADLs. OT Goal Formulation: With patient  OT Frequency:     Barriers to D/C:            Co-evaluation  End of Session    Activity Tolerance: Patient tolerated treatment well Patient left: in chair;with call bell/phone within reach;with chair alarm set   Time: [phone] OT Time Calculation (min): 35 min Charges:  OT General Charges $OT Visit: 1 Procedure OT Evaluation $OT Eval Moderate Complexity: 1 Procedure G-Codes:    Harrel Carina, MS, OTR/L Harrel Carina 09/21/2015, 10:59 AM

## 2015-09-21 NOTE — Progress Notes (Signed)
[facility]  Daily Progress Note   Subjective  - patient states he believes he has more feeling in his foot but still is unable to wiggle his toes or flex his calf muscle. No further episodes of amaurosis fugax or TIA like symptoms. He denies lower extremity pain  Objective Filed Vitals:   09/20/15 1344 09/20/15 2032 09/21/15 0436 09/21/15 1544  BP: 139/77 152/84 158/81 109/64  Pulse: 59 71 59 70  Temp: 98 F (36.7 C) 99 F (37.2 C) 98.1 F (36.7 C) 97.8 F (36.6 C)  TempSrc: Oral Oral Oral Oral  Resp: _0 Height:      Weight:      SpO2: 99% 97% 100% 100%    Intake/Output Summary (Last 24 hours) at 09/21/15 2005 Last data filed at 09/21/15 0956  Gross per 24 hour  Intake 1041.08 ml  Output   1700 ml  Net -658.92 ml    PULM  Normal effort , no use of accessory muscles CV  No JVD, RRR Abd      No distended, nontender VASC  Feet remained pink and appear well perfused with 1 second capillary refill. Pedal pulses are not palpable area no ulcerations.  Laboratory CBC    Component Value Date/Time   WBC 9.0 09/20/2015 0007   HGB 14.4 09/20/2015 0007   HCT 41.5 09/20/2015 0007   PLT 173 09/20/2015 0007    BMET    Component Value Date/Time   NA 142 09/20/2015 0007   K 3.9 09/20/2015 0007   CL 109 09/20/2015 0007   CO2 24 09/20/2015 0007   GLUCOSE 93 09/20/2015 0007   BUN 12 09/20/2015 0007   CREATININE 1.13 09/20/2015 0007   CALCIUM 8.9 09/20/2015 0007   GFRNONAA >60 09/20/2015 0007   GFRAA >60 09/20/2015 0007    Assessment/Planning:   Multiple acute cerebral infarcts more numerous on the left compared to the right but also located in the cerebellar region:   I am concerned that the nature of the multiple infarcts suggests a cardiac source. Cardiology states the patient has been in sinus rhythm. 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 I do not believe  that he has had a significant change in his vascular status to cause these symptoms. His physical examination is unchanged from the preevent status and his past CTs do not show any difference compared to the latest CT angiography. Currently he is on heparin he will require antiplatelet therapy I will plan to discuss with cardiology the possibility that this is the result of an episodic arrhythmia.  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 I will follow up as an outpatient with him and further discussions regarding aortic and iliac reconstruction will be undertaken. But in the meantime given his acute strokes no nonemergent intervention will be performed for approximately 8 weeks.  Lumbar sacral spine disease:   MRI today does not show any cord-related injury. His previous laminectomy site appears to be adequate although there is likely herniation at L4-5 with impingement on  the right nerve root this also could be adding to his right lower extremity symptoms. I would defer further evaluation to the spine service which can be done as an outpatient.  Total length of time in discussion with the patient and his family was 78 minutes  Schnier, Dolores Lory  09/21/2015, 8:05 PM

## 2015-09-21 NOTE — Progress Notes (Signed)
[facility] NAME: Gary Mccoy    MR#:  [id number]  DATE OF BIRTH:  [birth year]  SUBJECTIVE:  CHIEF COMPLAINT:   Chief Complaint  Patient presents with  . Numbness    continues To have numb feeling in the right foot Patient's MRI confirms acute embolic CVA  REVIEW OF SYSTEMS:  CONSTITUTIONAL: No fever, fatigue or weakness.  EYES: No blurred or double vision.  EARS, NOSE, AND THROAT: No tinnitus or ear pain.  RESPIRATORY: No cough, shortness of breath, wheezing or hemoptysis.  CARDIOVASCULAR: No chest pain, orthopnea, edema.  GASTROINTESTINAL: No nausea, vomiting, diarrhea or abdominal pain.  GENITOURINARY: No dysuria, hematuria.  ENDOCRINE: No polyuria, nocturia,  HEMATOLOGY: No anemia, easy bruising or bleeding SKIN: No rash or lesion. MUSCULOSKELETAL: No joint pain or arthritis.   NEUROLOGIC: Numbness in the right leg weakness in the right leg.  PSYCHIATRY: No anxiety or depression.   ROS  DRUG ALLERGIES:  No Known Allergies  VITALS:  Blood pressure 158/81, pulse 59, temperature 98.1 F (36.7 C), temperature source Oral, resp. rate 18, height _0  (1.854 m), weight 88.451 kg (195 lb), SpO2 100 %.  PHYSICAL EXAMINATION:  GENERAL:  65 y.o.-year-old patient lying in the bed with no acute distress.  EYES: Pupils equal, round, reactive to light and accommodation. No scleral icterus. Extraocular muscles intact.  HEENT: Head atraumatic, normocephalic. Oropharynx and nasopharynx clear.  NECK:  Supple, no jugular venous distention. No thyroid enlargement, no tenderness.  LUNGS: Normal breath sounds bilaterally, no wheezing, rales,rhonchi or crepitation. No use of accessory muscles of respiration.  CARDIOVASCULAR: S1, S2 normal. No murmurs, rubs, or gallops.  ABDOMEN: Soft, nontender, nondistended. Bowel sounds present. No organomegaly or mass.  EXTREMITIES: No pedal edema, cyanosis, or clubbing.  NEUROLOGIC: Cranial nerves  II through XII are intact. Muscle strength 5/5 in all extremities except right lower leg- which is 3/5 inRight foot. Sensation intact. Gait not checked.  PSYCHIATRIC: The patient is alert and oriented x 3.  SKIN: No obvious rash, lesion, or ulcer.   Physical Exam LABORATORY PANEL:   CBC  Recent Labs Lab 09/20/15 0007  WBC 9.0  HGB 14.4  HCT 41.5  PLT 173   ------------------------------------------------------------------------------------------------------------------  Chemistries   Recent Labs Lab 09/19/15 1233 09/20/15 0007  NA 139 142  K 3.8 3.9  CL 108 109  CO2 23 24  GLUCOSE 110* 93  BUN 11 12  CREATININE 1.03 1.13  CALCIUM 9.1 8.9  AST 31  --   ALT 27  --   ALKPHOS 68  --   BILITOT 1.2  --    ------------------------------------------------------------------------------------------------------------------  Cardiac Enzymes No results for input(s): TROPONINI in the last 168 hours. ------------------------------------------------------------------------------------------------------------------  RADIOLOGY:  [name] Contrast  09/20/2015  CLINICAL DATA:  65 year old male with right lower extremity numbness and weakness. Possible DVT. Difficulty walking. Initial encounter. EXAM: MRI HEAD WITHOUT CONTRAST TECHNIQUE: Multiplanar, multiecho pulse sequences of the brain and surrounding structures were obtained without intravenous contrast. COMPARISON:  Brain MRI 08/08/2010, head CT 07/12/2010 FINDINGS: Major intracranial vascular flow voids are stable. However, there are patchy acute cortically based infarcts in the bilateral superior frontal gyri, most confluent posteriorly at the medial left motor and sensory strip (right lower extremity representation area). The pattern in the frontal lobes is most suggestive of bilateral ACA territory ischemia, however, there is also two left occipital lobe small cortical infarcts (series 100, image 29). No posterior fossa restricted  diffusion. No  deep gray matter or temporal lobe restricted diffusion. Mild cytotoxic edema in the superior frontal lobes as seen on series 8, image 21. No associated hemorrhage or mass effect. There is new cortical encephalomalacia in the inferior left parietal and superior left occipital lobes since 2012 in keeping with remote left PCA infarct (series 7, image 11). Multiple chronic infarcts in the cerebellum, primarily the left hemisphere, appear stable since 2012. Mildly increased patchy T2 hyperintensity in the pons. Elsewhere mild for age chronic nonspecific cerebral white matter signal has mildly progressed since 2012. No midline shift, mass effect, evidence of mass lesion, ventriculomegaly, extra-axial collection. Cervicomedullary junction and pituitary are within normal limits. No chronic cerebral blood products identified. Visible internal auditory structures appear normal. Mastoids are clear. Mild paranasal sinus mucosal thickening has not significantly changed. Negative orbit and scalp soft tissues. Normal bone marrow signal. Negative visualized cervical spine. IMPRESSION: 1. Scattered acute mostly cortically based infarcts in the superior frontal gyri a left greater than right, suggesting bilateral ACA territory ischemia. No associated hemorrhage or mass effect. 2. Superimposed acute on chronic ischemia in the left posterior MCA/PCA territory which is new since 2012. 3. 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 4. Mildly progressed signal changes in the cerebral white matter and pons favored to be small vessel disease related. Electronically Signed   By: Genevie Ann M.D.   On: 09/20/2015 12:47   Mr Lumbar Spine W Wo Contrast  09/20/2015  CLINICAL DATA:  Right lower extremity weakness, numbness and difficulty walking. EXAM: MRI LUMBAR SPINE WITHOUT AND WITH CONTRAST TECHNIQUE: Multiplanar and multiecho pulse sequences of the lumbar spine were obtained without and with  intravenous contrast. CONTRAST:  26m MULTIHANCE GADOBENATE DIMEGLUMINE 529 MG/ML IV SOLN COMPARISON:  Lumbar MRI 03/26/2014. The abdominal CTA runoff 09/19/2015. FINDINGS: Segmentation: Conventional anatomy assumed, with the last open disc space designated L5-S1. Alignment:  Normal. Vertebrae: No worrisome osseous lesion, acute fracture or pars defect. 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 There are mild sacroiliac degenerative changes, worse on the right. Conus medullaris: Extends to the T12-L1 level and appears normal. Paraspinal and other soft tissues: No significant paraspinal findings. Extensive aortoiliac atherosclerosis with known occlusion of the left common iliac artery. Disc levels: L1-2: Mild disc bulging with anterior osteophytes. No spinal stenosis or nerve root encroachment. L2-3: Mild disc bulging. No spinal stenosis or nerve root encroachment. L3-4: Minimal disc bulging with mild facet and ligamentous hypertrophy. Stable minimal narrowing of the lateral recesses. No nerve root encroachment. L4-5: Stable postsurgical changes status post bilateral laminectomy. The spinal canal is adequately decompressed. There is a small disc protrusion in the right subarticular zone, contributing to asymmetric narrowing of the right lateral recess and potential right L5 nerve root encroachment. There is mild facet hypertrophy and mild right foraminal narrowing. L5-S1: Stable disc bulging, endplate osteophytes and bilateral facet hypertrophy. There is stable mild narrowing of the lateral recesses and foramina bilaterally. IMPRESSION: 1. A disc protrusion in the right subarticular zone at L4-5 appears slightly larger, contributing to asymmetric narrowing of the right lateral recess and possible right L5 nerve root encroachment. Mild right foraminal narrowing at that level appears unchanged. 2. Stable chronic spondylosis and facet hypertrophy at L5-S1, contributing to mild narrowing of the  lateral recesses and foramina bilaterally. 3. 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 Electronically Signed   By: WRichardean SaleM.D.   On: 09/20/2015 12:47   Ct Angio  Ao+bifem W/cm &/or Wo/cm  09/20/2015  ADDENDUM REPORT: 09/20/2015 13:18 ADDENDUM: Addendum created to address the title of the exam. EXAM: CT ANGIOGRAPHY ABDOMEN PELVIS AND RUNOFF WITH CONTRAST Electronically Signed   By: Corrie Mckusick D.O.   On: 09/20/2015 13:18  09/20/2015  CLINICAL DATA:  65 year old male with a history of numbness right leg. EXAM: CT ABDOMEN AND PELVIS WITH CONTRAST TECHNIQUE: Multidetector CT imaging of the abdomen and pelvis was performed using the standard protocol following bolus administration of intravenous contrast. CONTRAST:  125 cc Isovue 370 COMPARISON:  CT 08/28/2015 FINDINGS: Lower chest: Unremarkable appearance of the soft tissues of the chest wall. Heart size within normal limits.  No pericardial fluid/thickening. No lower mediastinal adenopathy. Unremarkable appearance of the distal esophagus. No hiatal hernia. No confluent airspace disease, pleural fluid, or pneumothorax within visualized lung. 3 mm nodule of the right middle lobe is again present. Abdomen/pelvis: Nonvascular: Unremarkable appearance of liver and spleen. Unremarkable appearance of bilateral adrenal glands. Unremarkable appearance of the pancreas. Significantly improved inflammatory changes of the pancreaticoduodenal groove. Unremarkable appearance of the gallbladder. No radiopaque cholelithiasis. No free air or significant free fluid. No abnormally distended small bowel or colon. Colonic diverticula without evidence of associated inflammatory changes. Small formed stool burden. Normal appendix with high density material within the lumen, potentially inspissated secretions/concretions. Unremarkable appearance of the bilateral kidneys with no hydronephrosis or nephrolithiasis. Unremarkable appearance of the  course of the bilateral ureters. Unremarkable appearance of the urinary bladder. Unremarkable prostate. Vascular: Aorta: Mixed calcified and soft plaque of the abdominal aorta, with the preponderance of disease in the infrarenal abdominal aorta where there is circumferential disease. No aneurysm or dissection flap. No periaortic fluid. No periaortic inflammatory changes. Mesenteric vasculature: Celiac artery patent, with atherosclerotic changes at the origin. There is suggestion of 50% narrowing at the origin. Superior mesenteric artery is patent at the origin with atherosclerotic changes. Atherosclerotic plaque at the origin the right will renal artery with perhaps 50% stenosis. Atherosclerotic changes at the origin of the left renal artery with perhaps 50% stenosis. Inferior mesenteric artery appears to maintain patency at the origin. Right lower extremity: Right common iliac artery and external iliac artery are patent with atherosclerotic changes. No aneurysm or dissection flap. 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 As was demonstrated on prior CT, there is occlusion of the common femoral artery, with reconstitution of the proximal superficial femoral artery and profunda femoris via collateral flow. The remainder of the length of the superficial femoral artery maintains patency, with partially calcified plaque within the adductor canal. Popliteal artery is patent to the trifurcation. Proximal anterior tibial artery demonstrates atherosclerotic disease though appears patent proximally. Tibioperoneal trunk appears patent. Proximal peroneal artery and posterior tibial artery appear patent. No contrast identified within the distal anterior tibial artery or peroneal artery, although this may be a consequence of early image acquisition. No delay was performed. Posterior tibial artery appears to have contrast filling through the length to the malleolus. Left lower extremity: Occlusion of the left common  iliac artery and external iliac artery, similar to the comparison. Hypogastric artery is occluded at the origin. Reconstitution of the common femoral artery via collateral flow from inferior epigastric artery. Profunda femoris and left superficial femoral artery are patent. Superficial femoral artery is patent throughout its course with atherosclerotic plaque at the adductor canal. Popliteal artery is patent to the trifurcation. Anterior tibial artery demonstrates atherosclerotic plaque at the origin though is patent proximally. Tibioperoneal trunk is patent. Peroneal artery  and posterior tibial artery are patent proximally. No contrast identified within the distal peroneal artery and anterior tibial artery, although this may be a consequence of early image acquisition. No delay was performed. Posterior tibial artery appears to have contrast filling through the length of the malleolus. Musculoskeletal: No displaced fracture. No bony canal narrowing. Degenerative disc disease of the lower thoracic and lumbar spine, similar to the comparison CT. No new evidence of disc bulge/herniation. IMPRESSION: Vascular: Aortic atherosclerosis and iliac disease. 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 Right femoral popliteal disease. 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 Right infrapopliteal disease, with patency of proximal AT, TP trunk, proximal peroneal artery, and proximal PT maintained. The PT appears patent to the ankle, although there is absence of contrast at the distal right AT and peroneal artery. This may represent either imaging before arrival of the contrast bolus or alternatively  occlusion. Left femoral popliteal disease. Mild mixed calcified and soft plaque of the left superficial femoral artery without occlusion. Popliteal artery patent to the trifurcation. Left infrapopliteal disease, with patency of proximal AT, TP trunk, proximal peroneal artery, and proximal PT maintained. The PT appears patent to the ankle, although there is absence of contrast at the distal right AT and peroneal artery. This may represent either imaging before arrival of the contrast bolus or alternatively occlusion. These results were discussed by telephone at the time of interpretation on 09/19/2015 at 3:25 pm with Dr. Annamarie Major of Vascular Surgery Service. Mesenteric and bilateral renal artery atherosclerotic changes, as above. Nonvascular: Improved inflammatory changes near the duodenum in this patient with a history of duodenal ulcer. Re- demonstration of right middle lobe pulmonary nodule. Repeat noncontrast chest CT in June 2018 (1 year after the most most recent CT) is again recommended. Signed, Dulcy Fanny. Earleen [place], DO [facility] Radiology Electronically Signed: By: Corrie Mckusick D.O. 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  Patient will have a TEE in the morning Will need anticoagulation with one of the neurosurgeon's on discharge Will order carotid Dopplers in light of the CVA  #2. Peripheral vascular disease, seen by vascular surgery continue current therapy #3. Leukocytosis, likely reactive now normal #4. Tobacco abuse counseling. Patient counseled by the  admitting physician  #5. Peptic ulcer disease, continue patient on PPI and Carafate, asymptomatic    All the records are reviewed and case discussed with Care Management/Social Workerr. Management plans discussed with the patient, family and they are in agreement.  CODE STATUS: full.  TOTAL TIME TAKING CARE OF THIS PATIENT: 35 minutes.    POSSIBLE D/C IN 1-2 DAYS, DEPENDING ON CLINICAL CONDITION.   Dustin Flock M.D on 09/21/2015   Between 7am to 6pm - Pager - [phone]  After 6pm go to [email or web address] - [place]  Office  [phone]  CC: Primary care physician; Rusty Aus, MD  Note: This dictation was prepared with Dragon dictation along with smaller phrase technology. Any transcriptional errors that result from this process are unintentional.

## 2015-09-22 ENCOUNTER — Encounter
Admission: EM | Disposition: A | Discharge status: Home / Self Care | Payer: Self-pay | Source: Home / Self Care | Attending: Internal Medicine

## 2015-09-22 ENCOUNTER — Inpatient Hospital Stay: Payer: Managed Care, Other (non HMO)

## 2015-09-22 ENCOUNTER — Inpatient Hospital Stay
Admit: 2015-09-22 | Discharge: 2015-09-22 | Disposition: A | Discharge status: Home / Self Care | Payer: Managed Care, Other (non HMO) | Attending: Cardiology | Admitting: Cardiology

## 2015-09-22 HISTORY — PX: TEE WITHOUT CARDIOVERSION: SHX5443

## 2015-09-22 LAB — HEPARIN LEVEL (UNFRACTIONATED): Heparin Unfractionated: 0.57 [IU]/mL (ref 0.30–0.70)

## 2015-09-22 SURGERY — ECHOCARDIOGRAM, TRANSESOPHAGEAL
Anesthesia: Moderate Sedation

## 2015-09-22 MED ORDER — ATORVASTATIN CALCIUM 40 MG PO TABS: 40.0000 mg | ORAL_TABLET | Freq: Every day | ORAL | Status: DC

## 2015-09-22 MED ORDER — MIDAZOLAM HCL 2 MG/2ML IJ SOLN
INTRAMUSCULAR | Status: AC | PRN
  Administered 2015-09-22: 2 mg via INTRAVENOUS
  Administered 2015-09-22: 1 mg via INTRAVENOUS

## 2015-09-22 MED ORDER — IOPAMIDOL (ISOVUE-370) INJECTION 76%
75.0000 mL | Freq: Once | INTRAVENOUS | Status: AC | PRN
  Administered 2015-09-22: 11:00:00 75 mL via INTRAVENOUS

## 2015-09-22 MED ORDER — FENTANYL CITRATE (PF) 100 MCG/2ML IJ SOLN
INTRAMUSCULAR | Status: AC
  Filled 2015-09-22: qty 4

## 2015-09-22 MED ORDER — RIVAROXABAN 20 MG PO TABS
20.0000 mg | ORAL_TABLET | Freq: Every day | ORAL | Status: DC
  Administered 2015-09-22: 11:00:00 20 mg via ORAL
  Filled 2015-09-22: qty 1

## 2015-09-22 MED ORDER — ASPIRIN 81 MG PO CHEW
81.0000 mg | CHEWABLE_TABLET | Freq: Every day | ORAL | Status: DC
  Administered 2015-09-22: 11:00:00 81 mg via ORAL
  Filled 2015-09-22: qty 1

## 2015-09-22 MED ORDER — FENTANYL CITRATE (PF) 100 MCG/2ML IJ SOLN
INTRAMUSCULAR | Status: AC | PRN
  Administered 2015-09-22: 25 ug via INTRAVENOUS
  Administered 2015-09-22: 50 ug via INTRAVENOUS

## 2015-09-22 MED ORDER — BUTAMBEN-TETRACAINE-BENZOCAINE 2-2-14 % EX AERO
INHALATION_SPRAY | CUTANEOUS | Status: AC
  Filled 2015-09-22: qty 20

## 2015-09-22 MED ORDER — MIDAZOLAM HCL 5 MG/5ML IJ SOLN
INTRAMUSCULAR | Status: AC
  Filled 2015-09-22: qty 10

## 2015-09-22 MED ORDER — RIVAROXABAN 20 MG PO TABS: 20.0000 mg | ORAL_TABLET | Freq: Every day | ORAL | Status: DC

## 2015-09-22 MED ORDER — SODIUM CHLORIDE 0.9 % IV SOLN: INTRAVENOUS | Status: DC

## 2015-09-22 MED ORDER — ASPIRIN 81 MG PO CHEW: 81.0000 mg | CHEWABLE_TABLET | Freq: Every day | ORAL | Status: DC

## 2015-09-22 NOTE — Discharge Summary (Signed)
Gary Mccoy., 65 y.o., DOB [birth year], MRN [id number]. Admission date: 09/19/2015 Discharge Date 09/22/2015 Primary MD Rusty Aus, MD Admitting Physician Theodoro Grist, MD  Admission Diagnosis  Lower limb ischemia [I99.8] Right leg weakness [R29.898]  Discharge Diagnosis   Active Problems:   Acute embolic CVA   Peripheral vascular disease   Tobacco abuse counseling    Leukocytosis   CVA (cerebral infarction) Hyperlipidemia         Hospital Course  Patient is a 65 year old male with known history of chronic low back pain and bilateral extremity weakness, peripheral vascular disease who presented with complaint of having right lower extremity numbness and weakness. Patient underwent a CT angiogram of the aorta and bifemoral area which revealed chronically aortic arthrosclerosis, right infrapopliteal disease with patency of proximal AT PT trunk, proximal peroneal artery, proximal PT maintained. He posterior tibialis appeared patent to the ankle, augh there was absece of contrast at the distal right anterior tibialis and peroneal artery, which could represent either contrast issues or alternatively, Occlusion. Initially his symptoms were thought to be vascular therefore a urgent vascular consult was obtained. He was seen by vascular they did not feel that this was vascular in nature. They recommended a neurological workup. Patient underwent a MRI which confirmed findings consistent with embolic CVA. Patient had a EKG on admission which showed atrial fibrillation for interpretation of the machine. However was seen by cardiology who felt that this was normal sinus rhythm. He was monitored on telemetry with no sign of atrial fibrillation however concern for embolic CVA he underwent a TEE which showed no evidence of thrombus. 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 And he has significant peripheral vascular disease therefore they felt that he  needed to be treated with chronic anticoagulation. Patient was initially on heparin but now switched to Xarelto. Patient was seen by physical therapy recommended outpatient PT. 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 As well as right-sided carotid artery stenosis less than 50%. He'll need to have this followed up as outpatient.          Consults  cardiology, vascular surgery and neurolgy  Significant Tests:  See full reports for all details      Ct Angio Neck W Or Wo Contrast  09/22/2015  CLINICAL DATA:  Left carotid occlusion EXAM: CT ANGIOGRAPHY NECK TECHNIQUE: Multidetector CT imaging of the neck was performed using the standard protocol during bolus administration of intravenous contrast. Multiplanar CT image reconstructions and MIPs were obtained to evaluate the vascular anatomy. Carotid stenosis measurements (when applicable) are obtained utilizing NASCET criteria, using the distal internal carotid diameter as the denominator. CONTRAST:  75 mL Isovue 370 IV COMPARISON:  Carotid Doppler 09/21/2015 FINDINGS: Aortic arch: Mild atherosclerotic calcification in the aortic arch. No aneurysm or dissection. Lung apices clear. Right carotid system: Mild atherosclerotic disease in the innominate artery. Right common carotid artery is patent with atherosclerotic calcification narrowing the lumen by approximately 50% diameter stenosis. Right carotid bifurcation widely patent. Calcified plaque just above the bifurcation with 50% diameter stenosis proximal right internal carotid artery. Right external carotid artery widely patent. Left carotid system: Left common carotid artery occluded at the origin. Reconstitution of the left internal carotid artery via external carotid artery collaterals. Small caliber left internal carotid artery which is patent to the skullbase. Vertebral arteries:Moderate stenosis origin left vertebral artery. Left vertebral artery is  dominant. Mild atherosclerotic  disease in the left vertebral artery which is patent to the basilar. Mild stenosis origin right vertebral artery which is patent to the basilar without significant additional stenosis. Skeleton: Disc degeneration and spondylosis in the cervical spine. No fracture or mass lesion. Other neck: Negative for mass or adenopathy in the neck. IMPRESSION: 50% diameter stenosis right common carotid artery. 50% diameter stenosis proximal right internal carotid artery above the bifurcation Occluded left common carotid artery at the origin. Reconstitution of left internal carotid artery via external carotid artery collaterals. Left internal carotid artery is patent to the skullbase Moderate stenosis origin left vertebral artery and mild stenosis origin right vertebral artery. Electronically Signed   By: Franchot Gallo M.D.   On: 09/22/2015 11:22   [name] Contrast  09/20/2015  CLINICAL DATA:  65 year old male with right lower extremity numbness and weakness. Possible DVT. Difficulty walking. Initial encounter. EXAM: MRI HEAD WITHOUT CONTRAST TECHNIQUE: Multiplanar, multiecho pulse sequences of the brain and surrounding structures were obtained without intravenous contrast. COMPARISON:  Brain MRI 08/08/2010, head CT 07/12/2010 FINDINGS: Major intracranial vascular flow voids are stable. However, there are patchy acute cortically based infarcts in the bilateral superior frontal gyri, most confluent posteriorly at the medial left motor and sensory strip (right lower extremity representation area). The pattern in the frontal lobes is most suggestive of bilateral ACA territory ischemia, however, there is also two left occipital lobe small cortical infarcts (series 100, image 29). No posterior fossa restricted diffusion. No deep gray matter or temporal lobe restricted diffusion. Mild cytotoxic edema in the superior frontal lobes as seen on series 8, image 21. No associated hemorrhage or mass  effect. There is new cortical encephalomalacia in the inferior left parietal and superior left occipital lobes since 2012 in keeping with remote left PCA infarct (series 7, image 11). Multiple chronic infarcts in the cerebellum, primarily the left hemisphere, appear stable since 2012. Mildly increased patchy T2 hyperintensity in the pons. Elsewhere mild for age chronic nonspecific cerebral white matter signal has mildly progressed since 2012. No midline shift, mass effect, evidence of mass lesion, ventriculomegaly, extra-axial collection. Cervicomedullary junction and pituitary are within normal limits. No chronic cerebral blood products identified. Visible internal auditory structures appear normal. Mastoids are clear. Mild paranasal sinus mucosal thickening has not significantly changed. Negative orbit and scalp soft tissues. Normal bone marrow signal. Negative visualized cervical spine. IMPRESSION: 1. Scattered acute mostly cortically based infarcts in the superior frontal gyri a left greater than right, suggesting bilateral ACA territory ischemia. No associated hemorrhage or mass effect. 2. Superimposed acute on chronic ischemia in the left posterior MCA/PCA territory which is new since 2012. 3. 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 4. Mildly progressed signal changes in the cerebral white matter and pons favored to be small vessel disease related. Electronically Signed   By: Genevie Ann M.D.   On: 09/20/2015 12:47   Mr Lumbar Spine W Wo Contrast  09/20/2015  CLINICAL DATA:  Right lower extremity weakness, numbness and difficulty walking. EXAM: MRI LUMBAR SPINE WITHOUT AND WITH CONTRAST TECHNIQUE: Multiplanar and multiecho pulse sequences of the lumbar spine were obtained without and with intravenous contrast. CONTRAST:  31m MULTIHANCE GADOBENATE DIMEGLUMINE 529 MG/ML IV SOLN COMPARISON:  Lumbar MRI 03/26/2014. The abdominal CTA runoff 09/19/2015. FINDINGS: Segmentation:  Conventional anatomy assumed, with the last open disc space designated L5-S1. Alignment:  Normal. Vertebrae: No worrisome osseous lesion, acute fracture or pars defect. The lumbar pedicles are diffusely short on  a congenital basis. There are mild sacroiliac degenerative changes, worse on the right. Conus medullaris: Extends to the T12-L1 level and appears normal. Paraspinal and other soft tissues: No significant paraspinal findings. Extensive aortoiliac atherosclerosis with known occlusion of the left common iliac artery. Disc levels: L1-2: Mild disc bulging with anterior osteophytes. No spinal stenosis or nerve root encroachment. L2-3: Mild disc bulging. No spinal stenosis or nerve root encroachment. L3-4: Minimal disc bulging with mild facet and ligamentous hypertrophy. Stable minimal narrowing of the lateral recesses. No nerve root encroachment. L4-5: Stable postsurgical changes status post bilateral laminectomy. The spinal canal is adequately decompressed. There is a small disc protrusion in the right subarticular zone, contributing to asymmetric narrowing of the right lateral recess and potential right L5 nerve root encroachment. There is mild facet hypertrophy and mild right foraminal narrowing. L5-S1: Stable disc bulging, endplate osteophytes and bilateral facet hypertrophy. There is stable mild narrowing of the lateral recesses and foramina bilaterally. IMPRESSION: 1. A disc protrusion in the right subarticular zone at L4-5 appears slightly larger, contributing to asymmetric narrowing of the right lateral recess and possible right L5 nerve root encroachment. Mild right foraminal narrowing at that level appears unchanged. 2. Stable chronic spondylosis and facet hypertrophy at L5-S1, contributing to mild narrowing of the lateral recesses and foramina bilaterally. 3. 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 Electronically Signed   By: Richardean Sale M.D.   On: 09/20/2015  12:47   US Abdomen Complete  08/28/2015  CLINICAL DATA:  Right upper quadrant pain and epigastric pain radiating to the back. EXAM: ABDOMEN ULTRASOUND COMPLETE COMPARISON:  None. FINDINGS: Gallbladder: No gallstones or wall thickening visualized. No sonographic Murphy sign noted by sonographer. Common bile duct: Diameter: 5.1 mm Liver: Mild increased parenchymal echogenicity. Small lesion in the right lobe measuring 6 x 5 x 8 mm. This is hypoechoic, possibly a cyst, but not fully characterized. IVC: No abnormality visualized. Pancreas: Visualized portion unremarkable. Spleen: Size and appearance within normal limits. Right Kidney: Length: 10.8 cm. Echogenicity within normal limits. No mass or hydronephrosis visualized. Left Kidney: Length: 11.3 cm. Echogenicity within normal limits. No mass or hydronephrosis visualized. Abdominal aorta: No aneurysm visualized. Other findings: None. IMPRESSION: 1. No acute findings. No findings to account for this patient's symptoms. Normal gallbladder with no bile duct dilation. 2. Mild diffuse hepatic steatosis. Probable small right lobe cyst measuring 8 mm. 3. No other abnormalities. Electronically Signed   By: Lajean Manes M.D.   On: 08/28/2015 19:10   Ct Abdomen Pelvis W Contrast  08/28/2015  CLINICAL DATA:  65 year old with right upper quadrant pain. Nausea and vomiting. Current smoker. EXAM: CT ABDOMEN AND PELVIS WITH CONTRAST TECHNIQUE: Multidetector CT imaging of the abdomen and pelvis was performed using the standard protocol following bolus administration of intravenous contrast. CONTRAST:  151m ISOVUE-300 IOPAMIDOL (ISOVUE-300) INJECTION 61% COMPARISON:  Ultrasound 08/28/2015 FINDINGS: Lower chest: Punctate linear density in the right middle lobe on sequence 4, image 3 is nonspecific. Otherwise, the lung bases are clear. No pleural effusions. Hepatobiliary: Punctate low-density structure in the right hepatic lobe on sequence 2, image 25 is too small to  definitively characterize but could represent a small cyst. Otherwise, normal appearance of the liver and gallbladder. No biliary dilatation. Main portal venous system is patent. Pancreas: Normal appearance of the pancreas without inflammation or duct dilatation. Spleen: Normal appearance of spleen without enlargement. Adrenals/Urinary Tract: Normal adrenal glands. Normal appearance of both kidneys and urinary bladder. No hydronephrosis.  There is a small calcification in the left kidney interpolar region but unclear if this represents a a nonobstructive stone or vascular calcification. Stomach/Bowel: Normal appearance of the stomach. There is possibly an ulceration involving the medial aspect of the proximal duodenum seen on sequence 2, image 27 and sequence 6, image 9. Subtle haziness in the fat surrounding this area. Normal appearance of small bowel without obstruction. Normal appearance of the appendix. Vascular/Lymphatic: No suspicious lymphadenopathy in the abdomen or pelvis. There is extensive atherosclerotic disease involving the infrarenal abdominal aorta with circumferential atherosclerotic plaque. The infrarenal abdominal aorta measures up to 2.3 cm. 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 The left external iliac artery is atretic and there is reconstitution of the left common femoral artery. Right common iliac artery and right external iliac artery appear to be patent but there is a near occlusion or high-grade stenosis in the right common femoral artery. There is flow in the proximal right femoral arteries. Limited evaluation for thrombus within the iliac veins due to mixing of blood and contrast. The celiac trunk and SMA are patent but there is plaque in the proximal SMA. Main bilateral renal arteries are patent but poorly characterized on this examination. Reproductive: No gross abnormality to the prostate or seminal vesicles. Other: No free fluid.  No free air.  Musculoskeletal: No acute bone abnormality. IMPRESSION: Possible duodenal ulcer along the medial aspect of the proximal duodenum. Subtle haziness in the adjacent fat. Recommend clinical correlation in this area and better characterized with endoscopy. Extensive atherosclerotic disease involving the aorta and iliac arteries. 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 In addition, there appears to be severe stenosis in the right common femoral artery. These vascular findings could be associated with claudication. There is a punctate 3 mm nodule in the right middle lobe which is nonspecific. Reportedly, the patient is a current smoker. Recommend follow-up based on Fleischner criteria: Non-contrast chest CT can be considered in 12 months if patient is high-risk. This recommendation follows the consensus statement: Guidelines for Management of Incidental Pulmonary Nodules Detected on CT Images:From the Fleischner Society 2017; published online before print (10.1148/radiol.[id number]). Electronically Signed   By: Markus Daft M.D.   On: 08/28/2015 20:33   Ct Angio Ao+bifem W/cm &/or Wo/cm  09/20/2015  ADDENDUM REPORT: 09/20/2015 13:18 ADDENDUM: Addendum created to address the title of the exam. EXAM: CT ANGIOGRAPHY ABDOMEN PELVIS AND RUNOFF WITH CONTRAST Electronically Signed   By: Corrie Mckusick D.O.   On: 09/20/2015 13:18  09/20/2015  CLINICAL DATA:  65 year old male with a history of numbness right leg. EXAM: CT ABDOMEN AND PELVIS WITH CONTRAST TECHNIQUE: Multidetector CT imaging of the abdomen and pelvis was performed using the standard protocol following bolus administration of intravenous contrast. CONTRAST:  125 cc Isovue 370 COMPARISON:  CT 08/28/2015 FINDINGS: Lower chest: Unremarkable appearance of the soft tissues of the chest wall. Heart size within normal limits.  No pericardial fluid/thickening. No lower mediastinal adenopathy. Unremarkable appearance  of the distal esophagus. No hiatal hernia. No confluent airspace disease, pleural fluid, or pneumothorax within visualized lung. 3 mm nodule of the right middle lobe is again present. Abdomen/pelvis: Nonvascular: Unremarkable appearance of liver and spleen. Unremarkable appearance of bilateral adrenal glands. Unremarkable appearance of the pancreas. Significantly improved inflammatory changes of the pancreaticoduodenal groove. Unremarkable appearance of the gallbladder. No radiopaque cholelithiasis. No free air or significant free fluid. No abnormally  distended small bowel or colon. Colonic diverticula without evidence of associated inflammatory changes. Small formed stool burden. Normal appendix with high density material within the lumen, potentially inspissated secretions/concretions. Unremarkable appearance of the bilateral kidneys with no hydronephrosis or nephrolithiasis. Unremarkable appearance of the course of the bilateral ureters. Unremarkable appearance of the urinary bladder. Unremarkable prostate. Vascular: Aorta: Mixed calcified and soft plaque of the abdominal aorta, with the preponderance of disease in the infrarenal abdominal aorta where there is circumferential disease. No aneurysm or dissection flap. No periaortic fluid. No periaortic inflammatory changes. Mesenteric vasculature: Celiac artery patent, with atherosclerotic changes at the origin. There is suggestion of 50% narrowing at the origin. Superior mesenteric artery is patent at the origin with atherosclerotic changes. Atherosclerotic plaque at the origin the right will renal artery with perhaps 50% stenosis. Atherosclerotic changes at the origin of the left renal artery with perhaps 50% stenosis. Inferior mesenteric artery appears to maintain patency at the origin. Right lower extremity: Right common iliac artery and external iliac artery are patent with atherosclerotic changes. No aneurysm or dissection flap. Right hypogastric artery is  occluded at the origin. As was demonstrated on prior CT, there is occlusion of the common femoral artery, with reconstitution of the proximal superficial femoral artery and profunda femoris via collateral flow. The remainder of the length of the superficial femoral artery maintains patency, with partially calcified plaque within the adductor canal. Popliteal artery is patent to the trifurcation. Proximal anterior tibial artery demonstrates atherosclerotic disease though appears patent proximally. Tibioperoneal trunk appears patent. Proximal peroneal artery and posterior tibial artery appear patent. No contrast identified within the distal anterior tibial artery or peroneal artery, although this may be a consequence of early image acquisition. No delay was performed. Posterior tibial artery appears to have contrast filling through the length to the malleolus. 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 Hypogastric artery is occluded at the origin. Reconstitution of the common femoral artery via collateral flow from inferior epigastric artery. Profunda femoris and left superficial femoral artery are patent. Superficial femoral artery is patent throughout its course with atherosclerotic plaque at the adductor canal. Popliteal artery is patent to the trifurcation. Anterior tibial artery demonstrates atherosclerotic plaque at the origin though is patent proximally. Tibioperoneal trunk is patent. Peroneal artery and posterior tibial artery are patent proximally. No contrast identified within the distal peroneal artery and anterior tibial artery, although this may be a consequence of early image acquisition. No delay was performed. Posterior tibial artery appears to have contrast filling through the length of the malleolus. Musculoskeletal: No displaced fracture. No bony canal narrowing. Degenerative disc disease of the lower thoracic and lumbar  spine, similar to the comparison CT. No new evidence of disc bulge/herniation. IMPRESSION: Vascular: Aortic atherosclerosis and iliac disease. 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 Right femoral popliteal disease. 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 Right infrapopliteal disease, with patency of proximal AT, TP trunk, proximal peroneal artery, and proximal PT maintained. The PT appears patent to the ankle, although there is absence of contrast at the distal right AT and peroneal artery. This may represent either imaging before arrival of the contrast bolus  or alternatively occlusion. Left femoral popliteal disease. Mild mixed calcified and soft plaque of the left superficial femoral artery without occlusion. Popliteal artery patent to the trifurcation. Left infrapopliteal disease, with patency of proximal AT, TP trunk, proximal peroneal artery, and proximal PT maintained. The PT appears patent to the ankle, although there is absence of contrast at the distal right AT and peroneal artery. This may represent either imaging before arrival of the contrast bolus or alternatively occlusion. These results were discussed by telephone at the time of interpretation on 09/19/2015 at 3:25 pm with Dr. Annamarie Major of Vascular Surgery Service. Mesenteric and bilateral renal artery atherosclerotic changes, as above. Nonvascular: Improved inflammatory changes near the duodenum in this patient with a history of duodenal ulcer. Re- demonstration of right middle lobe pulmonary nodule. Repeat noncontrast chest CT in June 2018 (1 year after the most most recent CT) is again recommended. Signed, Dulcy Fanny.  Earleen Newport, DO [facility] Electronically Signed: By: Corrie Mckusick D.O. On: 09/19/2015 15:29   US Carotid Bilateral  09/21/2015  CLINICAL DATA:  Stroke 2012. Hypertension, visual disturbance left greater than right, previous tobacco abuse. EXAM: BILATERAL CAROTID DUPLEX ULTRASOUND TECHNIQUE: Pearline Cables scale imaging, color Doppler and duplex ultrasound was performed of bilateral carotid and vertebral arteries in the neck. COMPARISON:  None. TECHNIQUE: Quantification of carotid stenosis is based on velocity parameters that correlate the residual internal carotid diameter with NASCET-based stenosis levels, using the diameter of the distal internal carotid lumen as the denominator for stenosis measurement. The following velocity measurements were obtained: PEAK SYSTOLIC/END DIASTOLIC RIGHT ICA:                     115/26cm/sec CCA:                     767/34LP/FXT SYSTOLIC ICA/CCA RATIO:  0.8 DIASTOLIC ICA/CCA RATIO: 0.6 ECA:                     176cm/sec LEFT ICA:                     67/28cm/sec CCA:                     0/2IO/XBD SYSTOLIC ICA/CCA RATIO:  53.2 DIASTOLIC ICA/CCA RATIO: 8.6 ECA:                     56cm/sec FINDINGS: RIGHT CAROTID ARTERY: Mild scattered plaque in the common carotid artery without significant stenosis. Mild noncalcified plaque in the proximal ICA without significant stenosis. Normal waveforms and color Doppler signal. RIGHT VERTEBRAL ARTERY:  Normal flow direction and waveform. LEFT CAROTID ARTERY: No color or flow signal in the common carotid artery and proximal external carotid artery. Collateral reconstitution at the carotid bulb with some eccentric calcified plaque, low resistance waveform, decreased systolic deceleration. LEFT VERTEBRAL ARTERY: Normal flow direction and waveform. IMPRESSION: 1. Long segment occlusion of the left common carotid artery with flow reconstitution at the bulb, patent ICA. 2. Minimal right carotid  bifurcation plaque resulting in less than 50% diameter stenosis. 3.  Antegrade bilateral vertebral arterial flow. Electronically Signed   By: Lucrezia Europe M.D.   On: 09/21/2015 14:52       Today   Subjective:   Jawaun Mitchell patient's feeling little stronger in the right lower extremity strength improved  Objective:   Blood pressure 125/73, pulse 65, temperature 97.5 F (36.4  C), temperature source Oral, resp. rate 18, height _0  (1.854 m), weight 88.451 kg (195 lb), SpO2 100 %.  .  Intake/Output Summary (Last 24 hours) at 09/22/15 1309 Last data filed at 09/22/15 0750  Gross per 24 hour  Intake 1785.6 ml  Output   1850 ml  Net  -64.4 ml    Exam VITAL SIGNS: Blood pressure 125/73, pulse 65, temperature 97.5 F (36.4 C), temperature source Oral, resp. rate 18, height _1  (1.854 m), weight 88.451 kg (195 lb), SpO2 100 %.  GENERAL:  65 y.o.-year-old patient lying in the bed with no acute distress.  EYES: Pupils equal, round, reactive to light and accommodation. No scleral icterus. Extraocular muscles intact.  HEENT: Head atraumatic, normocephalic. Oropharynx and nasopharynx clear.  NECK:  Supple, no jugular venous distention. No thyroid enlargement, no tenderness.  LUNGS: Normal breath sounds bilaterally, no wheezing, rales,rhonchi or crepitation. No use of accessory muscles of respiration.  CARDIOVASCULAR: S1, S2 normal. No murmurs, rubs, or gallops.  ABDOMEN: Soft, nontender, nondistended. Bowel sounds present. No organomegaly or mass.  EXTREMITIES: No pedal edema, cyanosis, or clubbing.  NEUROLOGIC: Cranial nerves II through XII are intact. Muscle strength 5/5 in all extremities.Except right foot is 3 out of 5  Sensation intact. Gait not checked.  PSYCHIATRIC: The patient is alert and oriented x 3.  SKIN: No obvious rash, lesion, or ulcer.   Data Review     CBC w Diff: Lab Results  Component Value Date   WBC 9.0 09/20/2015   HGB 14.4 09/20/2015   HCT 41.5 09/20/2015    PLT 173 09/20/2015   LYMPHOPCT 14 09/19/2015   MONOPCT 6 09/19/2015   EOSPCT 1 09/19/2015   BASOPCT 1 09/19/2015   CMP: Lab Results  Component Value Date   NA 142 09/20/2015   K 3.9 09/20/2015   CL 109 09/20/2015   CO2 24 09/20/2015   BUN 12 09/20/2015   CREATININE 1.13 09/20/2015   PROT 7.0 09/19/2015   ALBUMIN 4.1 09/19/2015   BILITOT 1.2 09/19/2015   ALKPHOS 68 09/19/2015   AST 31 09/19/2015   ALT 27 09/19/2015  .  Micro Results No results found for this or any previous visit (from the past 240 hour(s)).      Code Status Orders        Start     Ordered   09/19/15 1651  Full code   Continuous     09/19/15 1650    Code Status History    Date Active Date Inactive Code Status Order ID Comments User Context   This patient has a current code status but no historical code status.    Advance Directive Documentation        Most Recent Value   Type of Advance Directive  Living will, Healthcare Power of Attorney   Pre-existing out of facility DNR order (yellow form or pink MOST form)     "MOST" Form in Place?            Follow-up Information    Follow up with Rusty Aus, MD. Go on 09/28/2015.   Specialty:  Internal Medicine   Why:  @ 10:00 AM   Contact information:   [place] [address] [phone]       Follow up with Schnier, Dolores Lory, MD. Go on 10/16/2015.   Specialties:  Vascular Surgery, Cardiology, Radiology, Vascular Surgery   Why:  @ 3:45 PM   Contact information:  [address] [place] [address] [phone]       Discharge Medications     Medication List    TAKE these medications        aspirin 81 MG chewable tablet  Chew 1 tablet (81 mg total) by mouth daily.     atorvastatin 40 MG tablet  Commonly known as:  LIPITOR  Take 1 tablet (40 mg total) by mouth daily at 6 PM.     celecoxib 200 MG capsule  Commonly known as:  CELEBREX  Take 200 mg by mouth daily.      docusate sodium 100 MG capsule  Commonly known as:  COLACE  Take 1 tablet once or twice daily as needed for constipation while taking narcotic pain medicine     MULTIVITAMIN ADULT PO  Take 1 tablet by mouth daily.     omeprazole 20 MG capsule  Commonly known as:  PRILOSEC  Take 20 mg by mouth 2 (two) times daily.     omeprazole 20 MG tablet  Commonly known as:  PRILOSEC OTC  Take 1 tablet (20 mg total) by mouth daily.     oxyCODONE-acetaminophen 5-325 MG tablet  Commonly known as:  ROXICET  Take 1-2 tablets by mouth every 4 (four) hours as needed for severe pain.     rivaroxaban 20 MG Tabs tablet  Commonly known as:  XARELTO  Take 1 tablet (20 mg total) by mouth daily.     sucralfate 1 g tablet  Commonly known as:  CARAFATE  Take 1 tablet (1 g total) by mouth 4 (four) times daily as needed (for abdominal discomfort, nausea, and/or vomiting).     VENTOLIN HFA 108 (90 Base) MCG/ACT inhaler  Generic drug:  albuterol  Take 1-2 puffs by mouth 3 (three) times daily as needed. For congestion, shortness of breath, and/or wheezing.           Total Time in preparing paper work, data evaluation and todays exam - 35 minutes  Dustin Flock M.D on 09/22/2015 at 1:09 PM  [facility]   Office  [phone]

## 2015-09-22 NOTE — Progress Notes (Signed)
PT Cancellation Note  Patient Details Name: Gary Mccoy. MRN: [id number] DOB: December 29, 1950   Cancelled Treatment:    Reason Eval/Treat Not Completed: Patient at procedure or test/unavailable. Pt's chart reviewed. Per RN, pt is off the floor to have a TEE performed and is not available at this time. PT will f/u at a later time and resume therapy when appropriate.   Neoma Laming, PT, DPT  09/22/2015, 8:25 AM [phone]

## 2015-09-22 NOTE — Discharge Instructions (Addendum)
DIET:  Cardiac diet  DISCHARGE CONDITION:  Stable  ACTIVITY:  Activity as tolerated with cane  OXYGEN:  Home Oxygen: No.   Oxygen Delivery: room air  DISCHARGE LOCATION:  home    ADDITIONAL DISCHARGE INSTRUCTION:out patient pt eval and treat   If you experience worsening of your admission symptoms, develop shortness of breath, life threatening emergency, suicidal or homicidal thoughts you must seek medical attention immediately by calling 911 or calling your MD immediately  if symptoms less severe.  You Must read complete instructions/literature along with all the possible adverse reactions/side effects for all the Medicines you take and that have been prescribed to you. Take any new Medicines after you have completely understood and accpet all the possible adverse reactions/side effects.   Please note  You were cared for by a hospitalist during your hospital stay. If you have any questions about your discharge medications or the care you received while you were in the hospital after you are discharged, you can call the unit and asked to speak with the hospitalist on call if the hospitalist that took care of you is not available. Once you are discharged, your primary care physician will handle any further medical issues. Please note that NO REFILLS for any discharge medications will be authorized once you are discharged, as it is imperative that you return to your primary care physician (or establish a relationship with a primary care physician if you do not have one) for your aftercare needs so that they can reassess your need for medications and monitor your lab values.

## 2015-09-22 NOTE — Procedures (Signed)
   TRANSESOPHAGEAL ECHOCARDIOGRAM   NAME:  Gary Mccoy.   MRN: [id number] DOB:  December 09, 1950   ADMIT DATE: 09/19/2015  INDICATIONS: Bilateral cva  PROCEDURE:     Informed consent was obtained prior to the procedure. The risks, benefits and alternatives for the procedure were discussed and the patient comprehended these risks.  Risks include, but are not limited to, cough, sore throat, vomiting, nausea, somnolence, esophageal and stomach trauma or perforation, bleeding, low blood pressure, aspiration, pneumonia, infection, trauma to the teeth and death.    After a procedural time-out, the patient was given 3 mg versed and 75 mcg fentanyl for moderate sedation.  The oropharynx was anesthetized 1 cc of topical 1% viscous lidocaine.  The transesophageal probe was inserted in the esophagus and stomach without difficulty and multiple views were obtained.    COMPLICATIONS:    There were no immediate complications.  FINDINGS:  LEFT VENTRICLE: EF = 55%. No regional wall motion abnormalities.  RIGHT VENTRICLE: Normal size and function.   LEFT ATRIUM:  No thrombus, No smoke  LEFT ATRIAL APPENDAGE: No thrombus.   RIGHT ATRIUM: Normal  AORTIC VALVE:  Trileaflet.   MITRAL VALVE:    Normal.  TRICUSPID VALVE: Normal.  PULMONIC VALVE: Grossly normal.  INTERATRIAL SEPTUM: No PFO or ASD.  PERICARDIUM: No effusion  DESCENDING AORTA: No thrombus   CONCLUSION:  No evidence of cardiac source of emboli. Agitated saline contrast revealed no right to left shunt

## 2015-09-22 NOTE — Progress Notes (Signed)
ANTICOAGULATION CONSULT NOTE - FOLLOW UP   Pharmacy Consult for heparin Indication: limb ischemia  No Known Allergies  Patient Measurements: Height: 6' 1" (185.4 cm) Weight: 195 lb (88.451 kg) IBW/kg (Calculated) : 79.9 Heparin Dosing Weight: 88 kg  Vital Signs: Temp: 97.9 F (36.6 C) (07/06 2033) Temp Source: Oral (07/06 2033) BP: 119/64 mmHg (07/06 2035) Pulse Rate: 70 (07/06 2035)  Labs:  Recent Labs  09/19/15 1233 09/20/15 0007 09/20/15 0554 09/21/15 4825 09/22/15 0505  HGB 15.2 14.4  --   --   --   HCT 43.1 41.5  --   --   --   PLT 194 173  --   --   --   APTT 29  --   --   --   --   LABPROT 14.2  --   --   --   --   INR 1.08  --   --   --   --   HEPARINUNFRC <0.10* 0.57 0.57 0.60 0.57  CREATININE 1.03 1.13  --   --   --   CKTOTAL 313  --   --   --   --    Estimated Creatinine Clearance: 74.6 mL/min (by C-G formula based on Cr of 1.13).  Medical History: Past Medical History  Diagnosis Date  . Wears dentures     full upper and lower  . Leg weakness, bilateral     s/p lumbar surgery  . Arthritis     lower back  . GERD (gastroesophageal reflux disease)    Assessment: Pharmacy consulted to dose and monitor heparin drip in this 65 year old male for limb ischemia. Patient was not taking anticoagulants prior to admission.   Baseline labs:  Hgb 15.2 Plt 194  INR 1.08 APTT 29  Goal of Therapy:  Heparin level 0.3-0.7 units/ml Monitor platelets by anticoagulation protocol: Yes   Plan:  No bolus per MD Start heparin infusion at 1400 units/hr Check anti-Xa level in 6 hours and daily while on heparin Continue to monitor H&H and platelets  7/5:  HL @ 00:00 = 0.57  Will continue this pt on current rate of 1400 units/hr and recheck HL on 7/5 @ 6:00.   7/5: HL @ 06:00 = 0.57 Will continue this pt on current rate and recheck HL on 7/6 with AM labs.   7/6: HL @ ~06:00 = 0.60; therefore will continue current heparin gtt rate. Will recheck heparin level  tomorrow morning.   09/22/15 0505 heparin level therapeutic. Continue current rate. Pharmacy will continue to monitor HL and CBC daily.  Laural Benes, Pharm.D., BCPS Clinical Pharmacist 09/22/2015,5:45 AM

## 2015-09-22 NOTE — Progress Notes (Signed)
Pt clinically stable post procedure, pt resting with resp's easy, vss. Report called to care nurse with plan reviewed, pt awakens with spoken voice, denies compalints

## 2015-09-22 NOTE — Progress Notes (Signed)
*  PRELIMINARY RESULTS* Echocardiogram 2D Echocardiogram has been performed.  Sherrie Sport 09/22/2015, 8:39 AM

## 2015-09-22 NOTE — Progress Notes (Signed)
Discharge instructions given and went over with patient and wife at bedside. Prescription given. All questions answered. Patient discharged home with wife via wheelchair by volunteer services. Madlyn Frankel, RN

## 2015-09-22 NOTE — Care Management (Signed)
Discharge to home today per Dr. Posey Pronto. Wife will transport. Shelbie Ammons RN MSN CCM Care Management [phone]

## 2015-09-25 ENCOUNTER — Encounter: Payer: Self-pay | Admitting: Cardiology

## 2015-09-28 DIAGNOSIS — I63423 Cerebral infarction due to embolism of bilateral anterior cerebral arteries: Secondary | ICD-10-CM

## 2015-09-28 HISTORY — DX: Cerebral infarction due to embolism of bilateral anterior cerebral arteries: I63.423

## 2015-10-04 ENCOUNTER — Encounter: Payer: Self-pay | Admitting: Physical Therapy

## 2015-10-04 ENCOUNTER — Ambulatory Visit: Payer: Managed Care, Other (non HMO) | Attending: Internal Medicine | Admitting: Physical Therapy

## 2015-10-04 DIAGNOSIS — R262 Difficulty in walking, not elsewhere classified: Secondary | ICD-10-CM | POA: Insufficient documentation

## 2015-10-04 DIAGNOSIS — M6281 Muscle weakness (generalized): Secondary | ICD-10-CM | POA: Insufficient documentation

## 2015-10-04 NOTE — Therapy (Signed)
[place] MAIN [facility] SERVICES [address] [place], [place], [address] Phone: [phone]   Fax:  [phone]  Physical Therapy Evaluation  Patient Details  Name: Gary Mccoy. MRN: [id number] Date of Birth: 1950/07/04 Referring Provider: Rusty Aus  Encounter Date: 10/04/2015    Past Medical History  Diagnosis Date  . Wears dentures     full upper and lower  . Leg weakness, bilateral     s/p lumbar surgery  . Arthritis     lower back  . GERD (gastroesophageal reflux disease)     Past Surgical History  Procedure Laterality Date  . Cervical disc surgery  1987  . Back surgery  2005  . Esophagogastroduodenoscopy (egd) with propofol N/A 09/04/2015    Procedure: ESOPHAGOGASTRODUODENOSCOPY (EGD) WITH PROPOFOL;  Surgeon: Lucilla Lame, MD;  Location: [place];  Service: Endoscopy;  Laterality: N/A;  . Tee without cardioversion N/A 09/22/2015    Procedure: TRANSESOPHAGEAL ECHOCARDIOGRAM (TEE);  Surgeon: Teodoro Spray, MD;  Location: [facility];  Service: Cardiovascular;  Laterality: N/A;    There were no vitals filed for this visit.       Subjective Assessment - 10/04/15 1734    Subjective Patient wants to get the feeling back in his leg and strength in it so he can walk better.    Pertinent History arthritis in his back, back surgery 12 years ago   Currently in Pain? No/denies            [facility] PT Assessment - 10/04/15 0001    Assessment   Medical Diagnosis cva   Referring Provider [place], [place] F   Onset Date/Surgical Date 09/18/15   Hand Dominance Right   Next MD Visit 7//31/17   Prior Therapy no   Restrictions   Weight Bearing Restrictions No   Balance Screen   Has the patient fallen in the past 6 months No   Has the patient had a decrease in activity level because of a fear of falling?  No   Is the patient reluctant to leave their home because of a fear of falling?  No   Home Manufacturing systems engineer residence   Living Arrangements Spouse/significant other   Available Help at Discharge Family   Type of [place] to enter   Entrance Stairs-Number of Steps 2   [place] One level   [place] - single point   Prior Function   Level of [place] --  not working currently   Leisure reading, TV, outside yard work   Charity fundraiser Status Within Advertising copywriter for tasks assessed   Attention Focused       PAIN: no reports of pain  POSTURE: WFL PROM/AROM: LLE ankle eversion 0/5, DF 2+/5 RlL single leg heel raise x 9 reps with UE support LLE heel raise x   STRENGTH:  Graded on a 0-5 scale Muscle Group Left Right  Shoulder flex    Shoulder Abd    Shoulder Ext    Shoulder IR/ER    Elbow    Wrist/hand    Hip Flex    Hip Abd 4/5 4/5  Hip Add 4/5   4/5    Hip Ext 3/5 3/5  Hip IR/ER 4/5 4/5  Knee Flex    Knee Ext    Ankle DF 5/5   Ankle PF 5/5 4/5  SENSATION: RLE calf to foot     FUNCTIONAL MOBILITY: Indepnedent  BALANCE:unable to single leg stand on LLE ,   GAIT: ambulates with SPC and LLE foot drop  OUTCOME MEASURES: TEST Outcome Interpretation  5 times sit<>stand 12.29sec >28 yo, >15 sec indicates increased risk for falls  10 meter walk test   1.29              m/s <1.0 m/s indicates increased risk for falls; limited community ambulator  Timed up and Go     9.06            sec <14 sec indicates increased risk for falls  6 minute walk test    800           Feet 1000 feet is community ambulator                                           Patient will benefit from skilled therapeutic intervention in order to improve the following deficits and impairments:     Visit Diagnosis: No diagnosis found.     Problem List Patient Active Problem List   Diagnosis Date Noted  . Weakness of right lower extremity  09/19/2015  . Tobacco abuse counseling 09/19/2015  . Peripheral vascular disease ([place]) 09/19/2015  . Leukocytosis 09/19/2015  . CVA (cerebral infarction) 09/19/2015  . Paralysis ([place])   . Abnormal findings-gastrointestinal tract   . Gastritis   . Duodenal ulcer disease   . 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 08/29/2015  . Contact with and (suspected ) exposure to other hazardous substances 01/27/2014  . Degeneration of intervertebral disc of lumbar region 11/12/2013  . Degeneration of intervertebral disc of lumbosacral region 11/12/2013  . Neuritis or radiculitis due to rupture of lumbar intervertebral disc 11/12/2013  . Thoracic neuritis 11/12/2013  . Benign essential HTN 02/01/2013   Alanson Puls, PT, DPT [place], [place] S 10/04/2015, 5:40 PM  [place] MAIN [facility] SERVICES [address] [place], [place], [address] Phone: [phone]   Fax:  [phone]  Name: Gary Mccoy. MRN: [id number] Date of Birth: 10-Jun-1950

## 2015-10-10 ENCOUNTER — Ambulatory Visit: Payer: Managed Care, Other (non HMO) | Admitting: Physical Therapy

## 2015-10-10 ENCOUNTER — Encounter: Payer: Self-pay | Admitting: Physical Therapy

## 2015-10-10 DIAGNOSIS — M6281 Muscle weakness (generalized): Secondary | ICD-10-CM

## 2015-10-10 DIAGNOSIS — R262 Difficulty in walking, not elsewhere classified: Secondary | ICD-10-CM

## 2015-10-10 NOTE — Therapy (Signed)
[place] MAIN [facility] SERVICES [address] [place], [place], [address] Phone: [phone]   Fax:  [phone]  Physical Therapy Treatment  Patient Details  Name: Gary Mccoy. MRN: [id number] Date of Birth: 1950-12-19 Referring Provider: Rusty Aus  Encounter Date: 10/10/2015      PT End of Session - 10/10/15 1705    Visit Number 2   Number of Visits 24   Date for PT Re-Evaluation 12/27/15   PT Start Time 0500   PT Stop Time 0540   PT Time Calculation (min) 40 min   Equipment Utilized During Treatment Gait belt   Activity Tolerance Patient tolerated treatment well;Patient limited by fatigue;Patient limited by lethargy   Behavior During Therapy WFL for tasks assessed/performed      Past Medical History:  Diagnosis Date  . Arthritis    lower back  . GERD (gastroesophageal reflux disease)   . Leg weakness, bilateral    s/p lumbar surgery  . Wears dentures    full upper and lower    Past Surgical History:  Procedure Laterality Date  . BACK SURGERY  2005  . [place]  . ESOPHAGOGASTRODUODENOSCOPY (EGD) WITH PROPOFOL N/A 09/04/2015   Procedure: ESOPHAGOGASTRODUODENOSCOPY (EGD) WITH PROPOFOL;  Surgeon: Lucilla Lame, MD;  Location: [facility];  Service: Endoscopy;  Laterality: N/A;  . TEE WITHOUT CARDIOVERSION N/A 09/22/2015   Procedure: TRANSESOPHAGEAL ECHOCARDIOGRAM (TEE);  Surgeon: Teodoro Spray, MD;  Location: [facility];  Service: Cardiovascular;  Laterality: N/A;    There were no vitals filed for this visit.      Subjective Assessment - 10/10/15 1705    Subjective Patient feels like he is getting a little bit better.    Pertinent History arthritis in his back, back surgery 12 years ago   Currently in Pain? No/denies   Pain Score 0-No pain     Therapeutic exercise :  BAPS board CCW, CW, fwd/bwd x 20  Tilt board fwd/ bwd; side to side x 20 Disk green and yellow standing stepping  position with holding ball in parallel bars  1/2 foam standing flat side up feet apart  1/2 foam standing flat side up tandem standing Leg press heel raises x 20 x 20 Leg press quantum x 90 lbs x 20 x 2 Patient needs verbal cueing to improve posture and cueing to correctly perform exercises slowly, holding at end of range to increase motor firing of desired muscle to encourage fatigue. He has RLE ankle eversion with uneven surfaces such as the foam and disk.                            PT Education - 10/10/15 1705    Education provided Yes   Education Details HEP   Person(s) Educated Patient   Methods Explanation   Comprehension Verbalized understanding             PT Long Term Goals - 10/04/15 1801      PT LONG TERM GOAL #1   Title Patient will be independent in home exercise program to improve strength/mobility for better functional independence with ADLs.   Time 12   Period Weeks   Status New     PT LONG TERM GOAL #2   Title Patient (> 35 years old) will complete five times sit to stand test in < 15 seconds indicating an increased LE strength and improved balance.  Time 12   Period Weeks   Status New     PT LONG TERM GOAL #3   Title Patient will ascend/descend 4 stairs without rail assist independently without loss of balance to improve ability to get in/out of home.    Time 12   Period Weeks   Status New     PT LONG TERM GOAL #4   Title Patient will tolerate 5 seconds of single leg stance without loss of balance to improve ability to get in and out of shower safely   Time 12   Period Weeks   Status New     PT LONG TERM GOAL #5   Title Patient will increase BLE gross strength to 4+/5 as to improve functional strength for independent gait, increased standing tolerance and increased ADL ability.   Time 12   Period Weeks   Status New               Plan - 10/10/15 1706    Clinical Impression Statement Patient has RLE foot drop and  decreased sensation. He is able to perform ankle exercises for RLE in standing and seated to improve strength.    Rehab Potential Good   PT Frequency 2x / week   PT Duration 12 weeks   PT Treatment/Interventions Electrical Stimulation;Gait training;Therapeutic activities;Therapeutic exercise;Balance training;Passive range of motion   PT Next Visit Plan balance and strengthening   PT Home Exercise Plan heel raises, calf stretch, ankle DF in supine   Consulted and Agree with Plan of Care Patient      Patient will benefit from skilled therapeutic intervention in order to improve the following deficits and impairments:  Difficulty walking, Decreased balance, Decreased strength, Decreased endurance, Decreased activity tolerance, Impaired sensation, Abnormal gait, Cardiopulmonary status limiting activity  Visit Diagnosis: Muscle weakness (generalized)  Difficulty in walking, not elsewhere classified     Problem List Patient Active Problem List   Diagnosis Date Noted  . Weakness of right lower extremity 09/19/2015  . Tobacco abuse counseling 09/19/2015  . Peripheral vascular disease ([place]) 09/19/2015  . Leukocytosis 09/19/2015  . CVA (cerebral infarction) 09/19/2015  . Paralysis ([place])   . Abnormal findings-gastrointestinal tract   . Gastritis   . Duodenal ulcer disease   . 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 08/29/2015  . Contact with and (suspected ) exposure to other hazardous substances 01/27/2014  . Degeneration of intervertebral disc of lumbar region 11/12/2013  . Degeneration of intervertebral disc of lumbosacral region 11/12/2013  . Neuritis or radiculitis due to rupture of lumbar intervertebral disc 11/12/2013  . Thoracic neuritis 11/12/2013  . Benign essential HTN 02/01/2013  Alanson Puls, PT, DPT  [place], [place] S 10/10/2015, 5:08 PM  [place] MAIN [facility] SERVICES [address] [place], [place], [address] Phone: [phone]    Fax:  [phone]  Name: Gary Mccoy. MRN: [id number] Date of Birth: 22-Nov-1950

## 2015-10-13 ENCOUNTER — Encounter: Payer: Self-pay | Admitting: Physical Therapy

## 2015-10-13 ENCOUNTER — Ambulatory Visit: Payer: Managed Care, Other (non HMO) | Admitting: Physical Therapy

## 2015-10-13 DIAGNOSIS — R262 Difficulty in walking, not elsewhere classified: Secondary | ICD-10-CM

## 2015-10-13 DIAGNOSIS — M6281 Muscle weakness (generalized): Secondary | ICD-10-CM | POA: Diagnosis not present

## 2015-10-13 NOTE — Therapy (Signed)
[place] MAIN [facility] SERVICES [address] [place], [place], [address] Phone: [phone]   Fax:  [phone]  Physical Therapy Treatment  Patient Details  Name: Gary Mccoy Gan. MRN: [id number] Date of Birth: 1950-08-08 Referring Provider: Rusty Aus  Encounter Date: 10/13/2015      PT End of Session - 10/13/15 1400    Visit Number 3   Number of Visits 24   Date for PT Re-Evaluation 12/27/15   PT Start Time 7782   PT Stop Time 1427   PT Time Calculation (min) 42 min   Equipment Utilized During Treatment Gait belt   Activity Tolerance Patient tolerated treatment well;Patient limited by fatigue;Patient limited by lethargy   Behavior During Therapy WFL for tasks assessed/performed      Past Medical History:  Diagnosis Date  . Arthritis    lower back  . GERD (gastroesophageal reflux disease)   . Leg weakness, bilateral    s/p lumbar surgery  . Wears dentures    full upper and lower    Past Surgical History:  Procedure Laterality Date  . BACK SURGERY  2005  . [place]  . ESOPHAGOGASTRODUODENOSCOPY (EGD) WITH PROPOFOL N/A 09/04/2015   Procedure: ESOPHAGOGASTRODUODENOSCOPY (EGD) WITH PROPOFOL;  Surgeon: Lucilla Lame, MD;  Location: [place];  Service: Endoscopy;  Laterality: N/A;  . TEE WITHOUT CARDIOVERSION N/A 09/22/2015   Procedure: TRANSESOPHAGEAL ECHOCARDIOGRAM (TEE);  Surgeon: Teodoro Spray, MD;  Location: [facility];  Service: Cardiovascular;  Laterality: N/A;    There were no vitals filed for this visit.      Subjective Assessment - 10/13/15 1350    Subjective Pt reports the sensation and strength is gradually improving, but still feels like he doesn't have control of his ankle.   Pertinent History arthritis in his back, back surgery 12 years ago   Currently in Pain? No/denies      Therapeutic exercise :  Seated BAPS board CCW, CW x25, fwd/bwd 2 x 20   Quantum machine heel raises  90# x 20 B LE, x20 R LE Quantum leg press x 90 # x 20  ABCs x2 Towel scrunches  Cues for proper technique to target specific muscles, slow eccentric contractions for most effective strengthening and visualization to improve performance.  Neuromuscular:  1/2 foam standing flat side up feet apart with F/B weight shifts 1/2 foam standing flat side up tandem standing x1 min each Standing tilt board F/B x20 each  Cues for weight shifting to improve balance. Min A to CGA required during balance activities.                        PT Education - 10/13/15 1359    Education provided Yes   Education Details continue HEP   Person(s) Educated Patient   Methods Explanation   Comprehension Verbalized understanding             PT Long Term Goals - 10/04/15 1801      PT LONG TERM GOAL #1   Title Patient will be independent in home exercise program to improve strength/mobility for better functional independence with ADLs.   Time 12   Period Weeks   Status New     PT LONG TERM GOAL #2   Title Patient (> 64 years old) will complete five times sit to stand test in < 15 seconds indicating an increased LE strength and improved balance.   Time 12  Period Weeks   Status New     PT LONG TERM GOAL #3   Title Patient will ascend/descend 4 stairs without rail assist independently without loss of balance to improve ability to get in/out of home.    Time 12   Period Weeks   Status New     PT LONG TERM GOAL #4   Title Patient will tolerate 5 seconds of single leg stance without loss of balance to improve ability to get in and out of shower safely   Time 12   Period Weeks   Status New     PT LONG TERM GOAL #5   Title Patient will increase BLE gross strength to 4+/5 as to improve functional strength for independent gait, increased standing tolerance and increased ADL ability.   Time 12   Period Weeks   Status New               Plan - 10/13/15 1410    Clinical  Impression Statement Pt able to tolerate increased exercise intensity this session. Continues to have difficulty with exercises due to deficits of sensation and muscle strength in R foot. Ambulation gradually  improving with decreased foot drop.   Rehab Potential Good   PT Frequency 2x / week   PT Duration 12 weeks   PT Treatment/Interventions Electrical Stimulation;Gait training;Therapeutic activities;Therapeutic exercise;Balance training;Passive range of motion   PT Next Visit Plan balance and strengthening   PT Home Exercise Plan heel raises, calf stretch, ankle DF in supine   Consulted and Agree with Plan of Care Patient      Patient will benefit from skilled therapeutic intervention in order to improve the following deficits and impairments:  Difficulty walking, Decreased balance, Decreased strength, Decreased endurance, Decreased activity tolerance, Impaired sensation, Abnormal gait, Cardiopulmonary status limiting activity  Visit Diagnosis: Muscle weakness (generalized)  Difficulty in walking, not elsewhere classified     Problem List Patient Active Problem List   Diagnosis Date Noted  . Weakness of right lower extremity 09/19/2015  . Tobacco abuse counseling 09/19/2015  . Peripheral vascular disease ([place]) 09/19/2015  . Leukocytosis 09/19/2015  . CVA (cerebral infarction) 09/19/2015  . Paralysis ([place])   . Abnormal findings-gastrointestinal tract   . Gastritis   . Duodenal ulcer disease   . 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 08/29/2015  . Contact with and (suspected ) exposure to other hazardous substances 01/27/2014  . Degeneration of intervertebral disc of lumbar region 11/12/2013  . Degeneration of intervertebral disc of lumbosacral region 11/12/2013  . Neuritis or radiculitis due to rupture of lumbar intervertebral disc 11/12/2013  . Thoracic neuritis 11/12/2013  . Benign essential HTN 02/01/2013    Kabrea Seeney Shiela Mayer, PT, DPT  10/13/15, 2:29 PM [place] MAIN [facility] SERVICES [address] [place], [place], [address] Phone: [phone]   Fax:  [phone]  Name: Guhan Bruington. MRN: [id number] Date of Birth: Jun 04, 1950

## 2015-10-17 ENCOUNTER — Other Ambulatory Visit: Payer: Self-pay | Admitting: Vascular Surgery

## 2015-10-17 ENCOUNTER — Ambulatory Visit: Payer: Managed Care, Other (non HMO) | Attending: Internal Medicine | Admitting: Physical Therapy

## 2015-10-17 ENCOUNTER — Encounter: Payer: Self-pay | Admitting: Physical Therapy

## 2015-10-17 DIAGNOSIS — R262 Difficulty in walking, not elsewhere classified: Secondary | ICD-10-CM | POA: Insufficient documentation

## 2015-10-17 DIAGNOSIS — M6281 Muscle weakness (generalized): Secondary | ICD-10-CM | POA: Diagnosis not present

## 2015-10-17 DIAGNOSIS — T8859XA Other complications of anesthesia, initial encounter: Secondary | ICD-10-CM

## 2015-10-17 HISTORY — DX: Other complications of anesthesia, initial encounter: T88.59XA

## 2015-10-17 NOTE — Therapy (Signed)
[place] MAIN [facility] SERVICES [address] [place], [place], [address] Phone: [phone]   Fax:  [phone]  Physical Therapy Treatment  Patient Details  Name: Gary Mccoy. MRN: [id number] Date of Birth: 1950-05-25 Referring Provider: Rusty Aus  Encounter Date: 10/17/2015      PT End of Session - 10/17/15 1653    Visit Number 4   Number of Visits 24   Date for PT Re-Evaluation 12/27/15   PT Start Time 1635   PT Stop Time 8828   PT Time Calculation (min) 40 min   Equipment Utilized During Treatment Gait belt   Activity Tolerance Patient tolerated treatment well;Patient limited by fatigue;Patient limited by lethargy   Behavior During Therapy WFL for tasks assessed/performed      Past Medical History:  Diagnosis Date  . Arthritis    lower back  . GERD (gastroesophageal reflux disease)   . Leg weakness, bilateral    s/p lumbar surgery  . Wears dentures    full upper and lower    Past Surgical History:  Procedure Laterality Date  . BACK SURGERY  2005  . [place]  . ESOPHAGOGASTRODUODENOSCOPY (EGD) WITH PROPOFOL N/A 09/04/2015   Procedure: ESOPHAGOGASTRODUODENOSCOPY (EGD) WITH PROPOFOL;  Surgeon: Lucilla Lame, MD;  Location: [place];  Service: Endoscopy;  Laterality: N/A;  . TEE WITHOUT CARDIOVERSION N/A 09/22/2015   Procedure: TRANSESOPHAGEAL ECHOCARDIOGRAM (TEE);  Surgeon: Teodoro Spray, MD;  Location: [facility];  Service: Cardiovascular;  Laterality: N/A;    There were no vitals filed for this visit.      Subjective Assessment - 10/17/15 1645    Subjective Pt reports he is doing well. Continues to regain sensation in R foot. The ball and toes of R foot still numb.   Pertinent History arthritis in his back, back surgery 12 years ago   Currently in Pain? No/denies     Gait:  Dynamic gait indoors/outdoors on uneven surfaces including grass, hills and concrete >800 ft with SBA.  Decreased eccentric control of DF with R foot, but able to clear foot safely with no LOB during ambulation.   Therapeutic exercise:   Seated BAPS board CCW, CW x20, fwd/bwd 2 x 20  Four way ankle with YTB 2x10 each ABCs x2 Towel scrunches 3x25 Eccentric heel raises DL up, R SL down 2x10    Cues for proper technique to target specific muscles, slow eccentric contractions for most effective strengthening and visualization to improve performance.                            PT Education - 10/17/15 1650    Education provided Yes   Education Details continue current HEP   Person(s) Educated Patient   Methods Explanation   Comprehension Verbalized understanding             PT Long Term Goals - 10/04/15 1801      PT LONG TERM GOAL #1   Title Patient will be independent in home exercise program to improve strength/mobility for better functional independence with ADLs.   Time 12   Period Weeks   Status New     PT LONG TERM GOAL #2   Title Patient (> 2 years old) will complete five times sit to stand test in < 15 seconds indicating an increased LE strength and improved balance.   Time 12   Period Weeks   Status  New     PT LONG TERM GOAL #3   Title Patient will ascend/descend 4 stairs without rail assist independently without loss of balance to improve ability to get in/out of home.    Time 12   Period Weeks   Status New     PT LONG TERM GOAL #4   Title Patient will tolerate 5 seconds of single leg stance without loss of balance to improve ability to get in and out of shower safely   Time 12   Period Weeks   Status New     PT LONG TERM GOAL #5   Title Patient will increase BLE gross strength to 4+/5 as to improve functional strength for independent gait, increased standing tolerance and increased ADL ability.   Time 12   Period Weeks   Status New               Plan - 10/17/15 1700    Clinical Impression Statement Pt continues to  improve in R foot strength, seen in ability to perform exercises with increased intensity and repetition. He continues to have decreased motor control of R foot expecially as fatigue sets in but is improving. Pt will benefit from continued skilled therapy to progress further towards goals.    Rehab Potential Good   PT Frequency 2x / week   PT Duration 12 weeks   PT Treatment/Interventions Electrical Stimulation;Gait training;Therapeutic activities;Therapeutic exercise;Balance training;Passive range of motion   PT Next Visit Plan balance and strengthening   PT Home Exercise Plan heel raises, calf stretch, ankle DF in supine   Consulted and Agree with Plan of Care Patient      Patient will benefit from skilled therapeutic intervention in order to improve the following deficits and impairments:  Difficulty walking, Decreased balance, Decreased strength, Decreased endurance, Decreased activity tolerance, Impaired sensation, Abnormal gait, Cardiopulmonary status limiting activity  Visit Diagnosis: Muscle weakness (generalized)  Difficulty in walking, not elsewhere classified     Problem List Patient Active Problem List   Diagnosis Date Noted  . Weakness of right lower extremity 09/19/2015  . Tobacco abuse counseling 09/19/2015  . Peripheral vascular disease ([place]) 09/19/2015  . Leukocytosis 09/19/2015  . CVA (cerebral infarction) 09/19/2015  . Paralysis ([place])   . Abnormal findings-gastrointestinal tract   . Gastritis   . Duodenal ulcer disease   . 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 08/29/2015  . Contact with and (suspected ) exposure to other hazardous substances 01/27/2014  . Degeneration of intervertebral disc of lumbar region 11/12/2013  . Degeneration of intervertebral disc of lumbosacral region 11/12/2013  . Neuritis or radiculitis due to rupture of lumbar intervertebral disc 11/12/2013  . Thoracic neuritis 11/12/2013  . Benign essential HTN 02/01/2013    Chrisha Vogel Shiela Mayer, PT, DPT   10/17/15, 5:19 PM [place] MAIN [facility] SERVICES [address] [place], [place], [address] Phone: [phone]   Fax:  [phone]  Name: Gary Mccoy. MRN: [id number] Date of Birth: [birth year]

## 2015-10-19 ENCOUNTER — Ambulatory Visit: Payer: Managed Care, Other (non HMO) | Admitting: Physical Therapy

## 2015-10-19 ENCOUNTER — Encounter: Payer: Self-pay | Admitting: Physical Therapy

## 2015-10-19 DIAGNOSIS — M6281 Muscle weakness (generalized): Secondary | ICD-10-CM | POA: Diagnosis not present

## 2015-10-19 DIAGNOSIS — R262 Difficulty in walking, not elsewhere classified: Secondary | ICD-10-CM

## 2015-10-19 NOTE — Therapy (Signed)
[place] MAIN [facility] SERVICES [address] [place], [place], [address] Phone: [phone]   Fax:  [phone]  Physical Therapy Treatment  Patient Details  Name: Gary Mccoy. MRN: [id number] Date of Birth: February 01, 1951 Referring Provider: Rusty Aus  Encounter Date: 10/19/2015      PT End of Session - 10/19/15 1737    Visit Number 5   Number of Visits 24   Date for PT Re-Evaluation 12/27/15   PT Start Time 0488   PT Stop Time 1810   PT Time Calculation (min) 45 min   Equipment Utilized During Treatment Gait belt   Activity Tolerance Patient tolerated treatment well;Patient limited by fatigue;Patient limited by lethargy   Behavior During Therapy WFL for tasks assessed/performed      Past Medical History:  Diagnosis Date  . Arthritis    lower back  . GERD (gastroesophageal reflux disease)   . Leg weakness, bilateral    s/p lumbar surgery  . Wears dentures    full upper and lower    Past Surgical History:  Procedure Laterality Date  . BACK SURGERY  2005  . [place]  . ESOPHAGOGASTRODUODENOSCOPY (EGD) WITH PROPOFOL N/A 09/04/2015   Procedure: ESOPHAGOGASTRODUODENOSCOPY (EGD) WITH PROPOFOL;  Surgeon: Lucilla Lame, MD;  Location: [place];  Service: Endoscopy;  Laterality: N/A;  . TEE WITHOUT CARDIOVERSION N/A 09/22/2015   Procedure: TRANSESOPHAGEAL ECHOCARDIOGRAM (TEE);  Surgeon: Teodoro Spray, MD;  Location: [facility];  Service: Cardiovascular;  Laterality: N/A;    There were no vitals filed for this visit.      Subjective Assessment - 10/19/15 1733    Subjective Pt is doing well. He continues to report improvement with R LE. His L LE is giving him trouble occasionally due to his vascular issues.   Pertinent History arthritis in his back, back surgery 12 years ago   Currently in Pain? No/denies  L hip discomfort      Therapeutic exercise:   TM x 8 min 0.5 to 0.8 speed, 2% incline to  work on ankle DF and foot clearance during swing phase.  Cues for postural correction and heel to toe pattern.    Seated BAPS board CCW, CW x20, fwd/bwd 2 x 25  Four way ankle with YTB 2x20 each ABCs x2 Towel scrunches 3x25 Eccentric heel raises DL up, R SL down 2x10     Cues for proper technique to target specific muscles, slow eccentric contractions for most effective strengthening and visualization to improve performance.                            PT Education - 10/19/15 1736    Education provided Yes   Education Details heel to toe gait   Person(s) Educated Patient   Methods Explanation;Demonstration   Comprehension Verbalized understanding;Returned demonstration             PT Long Term Goals - 10/04/15 1801      PT LONG TERM GOAL #1   Title Patient will be independent in home exercise program to improve strength/mobility for better functional independence with ADLs.   Time 12   Period Weeks   Status New     PT LONG TERM GOAL #2   Title Patient (> 30 years old) will complete five times sit to stand test in < 15 seconds indicating an increased LE strength and improved balance.   Time 12  Period Weeks   Status New     PT LONG TERM GOAL #3   Title Patient will ascend/descend 4 stairs without rail assist independently without loss of balance to improve ability to get in/out of home.    Time 12   Period Weeks   Status New     PT LONG TERM GOAL #4   Title Patient will tolerate 5 seconds of single leg stance without loss of balance to improve ability to get in and out of shower safely   Time 12   Period Weeks   Status New     PT LONG TERM GOAL #5   Title Patient will increase BLE gross strength to 4+/5 as to improve functional strength for independent gait, increased standing tolerance and increased ADL ability.   Time 12   Period Weeks   Status New               Plan - 10/19/15 1739    Clinical Impression Statement Pt shows  improvement with R ankle control and strength each session. He requires frequent cues for proper technique of exercises and heel to toe pattern during ambulation for improved ability. He will benefit from progressed LE strengthening and balance training to further progress towards functional goals.   Rehab Potential Good   PT Frequency 2x / week   PT Duration 12 weeks   PT Treatment/Interventions Electrical Stimulation;Gait training;Therapeutic activities;Therapeutic exercise;Balance training;Passive range of motion   PT Next Visit Plan balance and strengthening   PT Home Exercise Plan heel raises, calf stretch, ankle DF in supine   Consulted and Agree with Plan of Care Patient      Patient will benefit from skilled therapeutic intervention in order to improve the following deficits and impairments:  Difficulty walking, Decreased balance, Decreased strength, Decreased endurance, Decreased activity tolerance, Impaired sensation, Abnormal gait, Cardiopulmonary status limiting activity  Visit Diagnosis: Muscle weakness (generalized)  Difficulty in walking, not elsewhere classified     Problem List Patient Active Problem List   Diagnosis Date Noted  . Weakness of right lower extremity 09/19/2015  . Tobacco abuse counseling 09/19/2015  . Peripheral vascular disease ([place]) 09/19/2015  . Leukocytosis 09/19/2015  . CVA (cerebral infarction) 09/19/2015  . Paralysis ([place])   . Abnormal findings-gastrointestinal tract   . Gastritis   . Duodenal ulcer disease   . 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 08/29/2015  . Contact with and (suspected ) exposure to other hazardous substances 01/27/2014  . Degeneration of intervertebral disc of lumbar region 11/12/2013  . Degeneration of intervertebral disc of lumbosacral region 11/12/2013  . Neuritis or radiculitis due to rupture of lumbar intervertebral disc 11/12/2013  . Thoracic neuritis 11/12/2013  . Benign essential HTN 02/01/2013    Gary Mccoy, PT,  DPT  10/19/15, 6:13 PM [place] MAIN [facility] SERVICES [address] [place], [place], [address] Phone: [phone]   Fax:  [phone]  Name: Gary Mccoy. MRN: [id number] Date of Birth: 02-May-1950

## 2015-10-23 ENCOUNTER — Encounter: Payer: Self-pay | Admitting: Physical Therapy

## 2015-10-23 ENCOUNTER — Ambulatory Visit: Payer: Managed Care, Other (non HMO) | Admitting: Physical Therapy

## 2015-10-23 DIAGNOSIS — M6281 Muscle weakness (generalized): Secondary | ICD-10-CM

## 2015-10-23 DIAGNOSIS — R262 Difficulty in walking, not elsewhere classified: Secondary | ICD-10-CM

## 2015-10-23 NOTE — Therapy (Signed)
[place] MAIN [facility] SERVICES [address] [place], [place], [address] Phone: [phone]   Fax:  [phone]  Physical Therapy Treatment  Patient Details  Name: Gary Mccoy. MRN: [id number] Date of Birth: 06/05/1950 Referring Provider: Rusty Aus  Encounter Date: 10/23/2015      PT End of Session - 10/23/15 1743    Visit Number 6   Number of Visits 24   Date for PT Re-Evaluation 12/27/15   PT Start Time 1745   PT Stop Time 1825   PT Time Calculation (min) 40 min   Equipment Utilized During Treatment Gait belt   Activity Tolerance Patient tolerated treatment well;Patient limited by fatigue;Patient limited by lethargy   Behavior During Therapy WFL for tasks assessed/performed      Past Medical History:  Diagnosis Date  . Arthritis    lower back  . GERD (gastroesophageal reflux disease)   . Leg weakness, bilateral    s/p lumbar surgery  . Wears dentures    full upper and lower    Past Surgical History:  Procedure Laterality Date  . BACK SURGERY  2005  . [place]  . ESOPHAGOGASTRODUODENOSCOPY (EGD) WITH PROPOFOL N/A 09/04/2015   Procedure: ESOPHAGOGASTRODUODENOSCOPY (EGD) WITH PROPOFOL;  Surgeon: Lucilla Lame, MD;  Location: [place];  Service: Endoscopy;  Laterality: N/A;  . TEE WITHOUT CARDIOVERSION N/A 09/22/2015   Procedure: TRANSESOPHAGEAL ECHOCARDIOGRAM (TEE);  Surgeon: Teodoro Spray, MD;  Location: [facility];  Service: Cardiovascular;  Laterality: N/A;    There were no vitals filed for this visit.      Subjective Assessment - 10/23/15 1742    Subjective Pt is doing well. He continues to report improvement with R LE. His L LE is giving him trouble occasionally due to his vascular issues.   Pertinent History arthritis in his back, back surgery 12 years ago   Currently in Pain? No/denies   Pain Score 0-No pain      Therapeutic exericse: Baps board CCW and CW x 20  Pro stretch x  30 x 3 Dina disk with BLE 1/2 foam tandem standing and balloon tapping Leg press RLE and 90 lbs x 20 x 3 Leg press 90 lbs ankle pumps x 20 x 3 Patient has RLE weakness and tone with single leg exercises and clonus in RLE ankle. Patient has to take rest periods for circulation.                             PT Education - 10/23/15 1743    Education provided Yes   Education Details heel to toe gait   Person(s) Educated Patient   Methods Explanation   Comprehension Verbalized understanding;Returned demonstration             PT Long Term Goals - 10/04/15 1801      PT LONG TERM GOAL #1   Title Patient will be independent in home exercise program to improve strength/mobility for better functional independence with ADLs.   Time 12   Period Weeks   Status New     PT LONG TERM GOAL #2   Title Patient (> 41 years old) will complete five times sit to stand test in < 15 seconds indicating an increased LE strength and improved balance.   Time 12   Period Weeks   Status New     PT LONG TERM GOAL #3   Title Patient  will ascend/descend 4 stairs without rail assist independently without loss of balance to improve ability to get in/out of home.    Time 12   Period Weeks   Status New     PT LONG TERM GOAL #4   Title Patient will tolerate 5 seconds of single leg stance without loss of balance to improve ability to get in and out of shower safely   Time 12   Period Weeks   Status New     PT LONG TERM GOAL #5   Title Patient will increase BLE gross strength to 4+/5 as to improve functional strength for independent gait, increased standing tolerance and increased ADL ability.   Time 12   Period Weeks   Status New               Plan - 10/23/15 1750    Clinical Impression Statement Patient has R ankle weakness and his sensation is improving. He continues to have foot drop and it gets tired quickly. He is able to perform strengthening exercises and balance  exercises.    Rehab Potential Good   PT Frequency 2x / week   PT Duration 12 weeks   PT Treatment/Interventions Electrical Stimulation;Gait training;Therapeutic activities;Therapeutic exercise;Balance training;Passive range of motion   PT Next Visit Plan balance and strengthening   PT Home Exercise Plan heel raises, calf stretch, ankle DF in supine   Consulted and Agree with Plan of Care Patient      Patient will benefit from skilled therapeutic intervention in order to improve the following deficits and impairments:  Difficulty walking, Decreased balance, Decreased strength, Decreased endurance, Decreased activity tolerance, Impaired sensation, Abnormal gait, Cardiopulmonary status limiting activity  Visit Diagnosis: Muscle weakness (generalized)  Difficulty in walking, not elsewhere classified     Problem List Patient Active Problem List   Diagnosis Date Noted  . Weakness of right lower extremity 09/19/2015  . Tobacco abuse counseling 09/19/2015  . Peripheral vascular disease ([place]) 09/19/2015  . Leukocytosis 09/19/2015  . CVA (cerebral infarction) 09/19/2015  . Paralysis ([place])   . Abnormal findings-gastrointestinal tract   . Gastritis   . Duodenal ulcer disease   . 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 08/29/2015  . Contact with and (suspected ) exposure to other hazardous substances 01/27/2014  . Degeneration of intervertebral disc of lumbar region 11/12/2013  . Degeneration of intervertebral disc of lumbosacral region 11/12/2013  . Neuritis or radiculitis due to rupture of lumbar intervertebral disc 11/12/2013  . Thoracic neuritis 11/12/2013  . Benign essential HTN 02/01/2013   Alanson Puls, PT, DPT [place], [place] 10/23/2015, 5:54 PM  [place] MAIN [facility] SERVICES [address] [place], [place], [address] Phone: [phone]   Fax:  [phone]  Name: Gary Mccoy. MRN: [id number] Date of Birth: 13-Feb-1951

## 2015-10-24 ENCOUNTER — Encounter
Admission: RE | Admit: 2015-10-24 | Discharge: 2015-10-24 | Disposition: A | Discharge status: Home / Self Care | Payer: Managed Care, Other (non HMO) | Source: Ambulatory Visit | Attending: Vascular Surgery | Admitting: Vascular Surgery

## 2015-10-24 ENCOUNTER — Ambulatory Visit
Admission: RE | Admit: 2015-10-24 | Discharge: 2015-10-24 | Disposition: A | Discharge status: Home / Self Care | Payer: Managed Care, Other (non HMO) | Source: Ambulatory Visit | Attending: Vascular Surgery | Admitting: Vascular Surgery

## 2015-10-24 DIAGNOSIS — Z01810 Encounter for preprocedural cardiovascular examination: Secondary | ICD-10-CM | POA: Insufficient documentation

## 2015-10-24 DIAGNOSIS — E785 Hyperlipidemia, unspecified: Secondary | ICD-10-CM | POA: Diagnosis not present

## 2015-10-24 DIAGNOSIS — M79609 Pain in unspecified limb: Secondary | ICD-10-CM | POA: Insufficient documentation

## 2015-10-24 DIAGNOSIS — I70221 Atherosclerosis of native arteries of extremities with rest pain, right leg: Secondary | ICD-10-CM | POA: Diagnosis not present

## 2015-10-24 DIAGNOSIS — Z01818 Encounter for other preprocedural examination: Secondary | ICD-10-CM

## 2015-10-24 DIAGNOSIS — F17200 Nicotine dependence, unspecified, uncomplicated: Secondary | ICD-10-CM | POA: Insufficient documentation

## 2015-10-24 DIAGNOSIS — Z01812 Encounter for preprocedural laboratory examination: Secondary | ICD-10-CM | POA: Insufficient documentation

## 2015-10-24 HISTORY — DX: Hyperlipidemia, unspecified: E78.5

## 2015-10-24 HISTORY — DX: Duodenal ulcer, unspecified as acute or chronic, without hemorrhage or perforation: K26.9

## 2015-10-24 HISTORY — DX: Cerebral infarction, unspecified: I63.9

## 2015-10-24 HISTORY — DX: Dorsalgia, unspecified: M54.9

## 2015-10-24 HISTORY — DX: Peripheral vascular disease, unspecified: I73.9

## 2015-10-24 LAB — BASIC METABOLIC PANEL
ANION GAP: 7 (ref 5–15)
BUN: 10 mg/dL (ref 6–20)
CHLORIDE: 107 mmol/L (ref 101–111)
CO2: 27 mmol/L (ref 22–32)
CREATININE: 0.96 mg/dL (ref 0.61–1.24)
Calcium: 9.1 mg/dL (ref 8.9–10.3)
GFR calc non Af Amer: >60 mL/min (ref >60)
Glucose, Bld: 93 mg/dL (ref 65–99)
Potassium: 3.4 mmol/L — ABNORMAL LOW (ref 3.5–5.1)
SODIUM: 141 mmol/L (ref 135–145)

## 2015-10-24 LAB — CBC WITH DIFFERENTIAL/PLATELET
BASOS PCT: 1 %
Basophils Absolute: 0.1 10*3/uL (ref 0–0.1)
EOS ABS: 0.4 10*3/uL (ref 0–0.7)
Eosinophils Relative: 4 %
HCT: 45.2 % (ref 40.0–52.0)
HEMOGLOBIN: 15.6 g/dL (ref 13.0–18.0)
LYMPHS ABS: 2.8 10*3/uL (ref 1.0–3.6)
Lymphocytes Relative: 32 %
MCH: 31.2 pg (ref 26.0–34.0)
MCHC: 34.4 g/dL (ref 32.0–36.0)
MCV: 90.6 fL (ref 80.0–100.0)
Monocytes Absolute: 0.8 10*3/uL (ref 0.2–1.0)
Monocytes Relative: 9 %
NEUTROS PCT: 54 %
Neutro Abs: 4.9 10*3/uL (ref 1.4–6.5)
Platelets: 187 10*3/uL (ref 150–440)
RBC: 4.99 MIL/uL (ref 4.40–5.90)
RDW: 15.6 % — ABNORMAL HIGH (ref 11.5–14.5)
WBC: 8.9 10*3/uL (ref 3.8–10.6)

## 2015-10-24 LAB — PROTIME-INR
INR: 1.37
PROTHROMBIN TIME: 17 s — AB (ref 11.4–15.2)

## 2015-10-24 LAB — SURGICAL PCR SCREEN
MRSA, PCR: NEGATIVE
STAPHYLOCOCCUS AUREUS: POSITIVE — AB

## 2015-10-24 LAB — APTT: APTT: 38 s — AB (ref 24–36)

## 2015-10-24 MED ORDER — CHLORHEXIDINE GLUCONATE CLOTH 2 % EX PADS
6.0000 | MEDICATED_PAD | Freq: Once | CUTANEOUS | Status: DC
  Filled 2015-10-24: qty 6

## 2015-10-24 NOTE — Patient Instructions (Signed)
  Your procedure is scheduled on: wed 11/08/15  Report to Same Day Surgery 2nd floor medical mall To find out your arrival time please call [phone] between 1PM - 3PM on Tues 11/07/15  Remember: Instructions that are not followed completely may result in serious medical risk, up to and including death, or upon the discretion of your surgeon and anesthesiologist your surgery may need to be rescheduled.    _x___ 1. Do not eat food or drink liquids after midnight. No gum chewing or hard candies.     __x__ 2. No Alcohol for 24 hours before or after surgery.   __x__3. No Smoking for 24 prior to surgery.   ____  4. Bring all medications with you on the day of surgery if instructed.    __x__ 5. Notify your doctor if there is any change in your medical condition     (cold, fever, infections).     Do not wear jewelry, make-up, hairpins, clips or nail polish.  Do not wear lotions, powders, or perfumes. You may wear deodorant.  Do not shave 48 hours prior to surgery. Men may shave face and neck.  Do not bring valuables to the hospital.    [facility] is not responsible for any belongings or valuables.               Contacts, dentures or bridgework may not be worn into surgery.  Leave your suitcase in the car. After surgery it may be brought to your room.  For patients admitted to the hospital, discharge time is determined by your treatment team.   Patients discharged the day of surgery will not be allowed to drive home.    Please read over the following fact sheets that you were given:   [facility] Preparing for Surgery and or MRSA Information   _x___ Take these medicines the morning of surgery with A SIP OF WATER:    1. VENTOLIN HFA 108 (90 Base) MCG/ACT inhaler and bring to hospital  2.  3.  4.  5.  6.  ____ Fleet Enema (as directed)   _x___ Use CHG Soap or sage wipes as directed on instruction sheet   _x___ Use inhalers on the day of surgery and bring to hospital day of  surgery  ____ Stop metformin 2 days prior to surgery    ____ Take 1/2 of usual insulin dose the night before surgery and none on the morning of           surgery.   ____ Stop aspirin or coumadin, or plavix  _x__ Stop Anti-inflammatories such as Advil, Aleve, Ibuprofen, Motrin, Naproxen,          Naprosyn, Goodies powders or aspirin products. Ok to take Tylenol.   ____ Stop supplements until after surgery.    ____ Bring C-Pap to the hospital.

## 2015-10-26 ENCOUNTER — Encounter: Payer: Self-pay | Admitting: Physical Therapy

## 2015-10-26 ENCOUNTER — Ambulatory Visit: Payer: Managed Care, Other (non HMO) | Admitting: Physical Therapy

## 2015-10-26 DIAGNOSIS — M6281 Muscle weakness (generalized): Secondary | ICD-10-CM

## 2015-10-26 DIAGNOSIS — R262 Difficulty in walking, not elsewhere classified: Secondary | ICD-10-CM

## 2015-10-26 NOTE — Therapy (Signed)
[place] MAIN [facility] SERVICES [address] [place], [place], [address] Phone: [phone]   Fax:  [phone]  Physical Therapy Treatment  Patient Details  Name: Gary Mccoy. MRN: [id number] Date of Birth: 1950-09-19 Referring Provider: Rusty Aus  Encounter Date: 10/26/2015      PT End of Session - 10/26/15 1618    Visit Number 7   Number of Visits 24   Date for PT Re-Evaluation 01-16-16   Authorization Type g codes    Authorization Time Period 7/10   PT Start Time 1615   PT Stop Time 1655   PT Time Calculation (min) 40 min   Equipment Utilized During Treatment Gait belt   Activity Tolerance Patient tolerated treatment well;Patient limited by fatigue;Patient limited by lethargy   Behavior During Therapy WFL for tasks assessed/performed      Past Medical History:  Diagnosis Date  . Arthritis    lower back  . Back pain   . Duodenal ulcer   . Elevated lipids   . GERD (gastroesophageal reflux disease)   . Leg weakness, bilateral    s/p lumbar surgery  . PAD (peripheral artery disease) ([place])   . Stroke ([place])    Rt lower leg  estimated 10 - 12 stokes in last 5 years  . Wears dentures    full upper and lower    Past Surgical History:  Procedure Laterality Date  . BACK SURGERY  2005  . [place]  . ESOPHAGOGASTRODUODENOSCOPY (EGD) WITH PROPOFOL N/A 09/04/2015   Procedure: ESOPHAGOGASTRODUODENOSCOPY (EGD) WITH PROPOFOL;  Surgeon: Lucilla Lame, MD;  Location: [place];  Service: Endoscopy;  Laterality: N/A;  . TEE WITHOUT CARDIOVERSION N/A 09/22/2015   Procedure: TRANSESOPHAGEAL ECHOCARDIOGRAM (TEE);  Surgeon: Teodoro Spray, MD;  Location: [facility];  Service: Cardiovascular;  Laterality: N/A;    There were no vitals filed for this visit.      Subjective Assessment - 10/26/15 1616    Subjective Pt is doing well. He continues to report improvement with R LE. His L LE is giving him trouble  occasionally due to his vascular issues.He is having surgery Aug 23rd. for vascular surgery.    Pertinent History arthritis in his back, back surgery 12 years ago   Limitations Walking;Standing   Patient Stated Goals to walk longer amounts of time and strengthen his right foot   Currently in Pain? No/denies   Pain Score 0-No pain   Multiple Pain Sites No      Therapeutic exercise: BAPS board cW, CCW, DF/PF x 20 Rocker board fwd/bwd, side to side x 20  Disc x 2 yellow and blue tandem standing and balloon tapping on mirror.  Heel raises x 20 Pro stretch x 30 xse x 3 Leg press RLE only 90 lbs x 20 x 3 Patient needs rest periods to get his blood back in his ankle during exercises.  Min cueing needed to appropriately perform strengthening tasks with leg position. Decreased coordination demonstrated requiring consistent verbal cueing to correct form. . Patient responds well to verbal and tactile cues to correct form and technique. SBA for safety with activities.                           PT Education - 10/26/15 1618    Education provided Yes   Education Details heel toe gait   Person(s) Educated Patient   Methods Explanation   Comprehension  Verbalized understanding             PT Long Term Goals - 10/04/15 1801      PT LONG TERM GOAL #1   Title Patient will be independent in home exercise program to improve strength/mobility for better functional independence with ADLs.   Time 12   Period Weeks   Status New     PT LONG TERM GOAL #2   Title Patient (> 85 years old) will complete five times sit to stand test in < 15 seconds indicating an increased LE strength and improved balance.   Time 12   Period Weeks   Status New     PT LONG TERM GOAL #3   Title Patient will ascend/descend 4 stairs without rail assist independently without loss of balance to improve ability to get in/out of home.    Time 12   Period Weeks   Status New     PT LONG TERM GOAL #4    Title Patient will tolerate 5 seconds of single leg stance without loss of balance to improve ability to get in and out of shower safely   Time 12   Period Weeks   Status New     PT LONG TERM GOAL #5   Title Patient will increase BLE gross strength to 4+/5 as to improve functional strength for independent gait, increased standing tolerance and increased ADL ability.   Time 12   Period Weeks   Status New               Plan - 10/26/15 1619    Clinical Impression Statement Patient continues to have right ankle and foot weakness that is getting better. He occassionally scuffs his right foot but does not ever catch his toe. He has difficulty with controling the foot with [place] and [place].    Rehab Potential Good   PT Frequency 2x / week   PT Duration 12 weeks   PT Treatment/Interventions Electrical Stimulation;Gait training;Therapeutic activities;Therapeutic exercise;Balance training;Passive range of motion   PT Next Visit Plan balance and strengthening   PT Home Exercise Plan heel raises, calf stretch, ankle DF in supine   Consulted and Agree with Plan of Care Patient      Patient will benefit from skilled therapeutic intervention in order to improve the following deficits and impairments:  Difficulty walking, Decreased balance, Decreased strength, Decreased endurance, Decreased activity tolerance, Impaired sensation, Abnormal gait, Cardiopulmonary status limiting activity  Visit Diagnosis: Muscle weakness (generalized)  Difficulty in walking, not elsewhere classified     Problem List Patient Active Problem List   Diagnosis Date Noted  . Weakness of right lower extremity 09/19/2015  . Tobacco abuse counseling 09/19/2015  . Peripheral vascular disease ([place]) 09/19/2015  . Leukocytosis 09/19/2015  . CVA (cerebral infarction) 09/19/2015  . Paralysis ([place])   . Abnormal findings-gastrointestinal tract   . Gastritis   . Duodenal ulcer disease   . Carbon monoxide  exposure 08/29/2015  . Contact with and (suspected ) exposure to other hazardous substances 01/27/2014  . Degeneration of intervertebral disc of lumbar region 11/12/2013  . Degeneration of intervertebral disc of lumbosacral region 11/12/2013  . Neuritis or radiculitis due to rupture of lumbar intervertebral disc 11/12/2013  . Thoracic neuritis 11/12/2013  . Benign essential HTN 02/01/2013   Alanson Puls, PT, DPT [employer], [place] S 10/26/2015, 4:21 PM  [place] MAIN [facility] SERVICES [address] [place], [place], [address] Phone: [phone]  Fax:  [phone]  Name: Stasia Cavalier. MRN: [id number] Date of Birth: 09/11/1950

## 2015-10-27 ENCOUNTER — Encounter
Admission: RE | Admit: 2015-10-27 | Discharge: 2015-10-27 | Disposition: A | Discharge status: Home / Self Care | Payer: Managed Care, Other (non HMO) | Source: Ambulatory Visit | Attending: Vascular Surgery | Admitting: Vascular Surgery

## 2015-10-27 DIAGNOSIS — Z01812 Encounter for preprocedural laboratory examination: Secondary | ICD-10-CM | POA: Diagnosis present

## 2015-10-28 NOTE — Pre-Procedure Instructions (Signed)
Positive staph aureus results sent to Dr. Delana Meyer for review by Lucina Mellow, R.N. on 10/24/15.

## 2015-10-31 ENCOUNTER — Ambulatory Visit: Payer: Managed Care, Other (non HMO) | Admitting: Physical Therapy

## 2015-10-31 ENCOUNTER — Encounter: Payer: Self-pay | Admitting: Physical Therapy

## 2015-10-31 DIAGNOSIS — R262 Difficulty in walking, not elsewhere classified: Secondary | ICD-10-CM

## 2015-10-31 DIAGNOSIS — M6281 Muscle weakness (generalized): Secondary | ICD-10-CM

## 2015-10-31 NOTE — Therapy (Signed)
[place] MAIN [facility] SERVICES [address] [place], [place], [address] Phone: [phone]   Fax:  [phone]  Physical Therapy Treatment  Patient Details  Name: Gary Mccoy. MRN: [id number] Date of Birth: 08/28/1950 Referring Provider: Rusty Aus  Encounter Date: 10/31/2015      PT End of Session - 10/31/15 1746    Visit Number 8   Number of Visits 24   Date for PT Re-Evaluation 12/30/15   Authorization Type g codes    Authorization Time Period 8/10   PT Start Time 1745   PT Stop Time 1825   PT Time Calculation (min) 40 min   Equipment Utilized During Treatment Gait belt   Activity Tolerance Patient tolerated treatment well;Patient limited by fatigue;Patient limited by lethargy   Behavior During Therapy WFL for tasks assessed/performed      Past Medical History:  Diagnosis Date  . Arthritis    lower back  . Back pain   . Duodenal ulcer   . Elevated lipids   . GERD (gastroesophageal reflux disease)   . Leg weakness, bilateral    s/p lumbar surgery  . PAD (peripheral artery disease) ([place])   . Stroke ([place])    Rt lower leg  estimated 10 - 12 stokes in last 5 years  . Wears dentures    full upper and lower    Past Surgical History:  Procedure Laterality Date  . BACK SURGERY  2005  . [place]  . ESOPHAGOGASTRODUODENOSCOPY (EGD) WITH PROPOFOL N/A 09/04/2015   Procedure: ESOPHAGOGASTRODUODENOSCOPY (EGD) WITH PROPOFOL;  Surgeon: Lucilla Lame, MD;  Location: [place];  Service: Endoscopy;  Laterality: N/A;  . TEE WITHOUT CARDIOVERSION N/A 09/22/2015   Procedure: TRANSESOPHAGEAL ECHOCARDIOGRAM (TEE);  Surgeon: Teodoro Spray, MD;  Location: [facility];  Service: Cardiovascular;  Laterality: N/A;    There were no vitals filed for this visit.      Subjective Assessment - 10/31/15 1755    Subjective Pt is doing well. He continues to report improvement with R LE. His L LE is giving him trouble  occasionally due to his vascular issues.He is having surgery Aug 23rd. for vascular surgery.    Pertinent History arthritis in his back, back surgery 12 years ago   Limitations Walking;Standing   Patient Stated Goals to walk longer amounts of time and strengthen his right foot   Currently in Pain? No/denies   Multiple Pain Sites No     Therapeutic exercise: BAPS board CCW, CW, DF/PF x 20  2 disc staggered tandem and weight shifting Tilt board fwd/bwd, side to side  1/2 foam flat side up tandem and side by side foot pattern with head turns and trunk rotation  Leg press 20 x 3 and heel raises 20 x 3 with 100 lbs Patient is fatigued with ankle and knee exercises but reports no pain. He has decreased circulation in BLE and needs standing rest periods.  Muscle fatigue but no major pain complaints                            PT Education - 10/31/15 1745    Education provided Yes   Education Details heel toe gait   Person(s) Educated Patient   Methods Explanation   Comprehension Verbalized understanding             PT Long Term Goals - 10/04/15 1801  PT LONG TERM GOAL #1   Title Patient will be independent in home exercise program to improve strength/mobility for better functional independence with ADLs.   Time 12   Period Weeks   Status New     PT LONG TERM GOAL #2   Title Patient (> 13 years old) will complete five times sit to stand test in < 15 seconds indicating an increased LE strength and improved balance.   Time 12   Period Weeks   Status New     PT LONG TERM GOAL #3   Title Patient will ascend/descend 4 stairs without rail assist independently without loss of balance to improve ability to get in/out of home.    Time 12   Period Weeks   Status New     PT LONG TERM GOAL #4   Title Patient will tolerate 5 seconds of single leg stance without loss of balance to improve ability to get in and out of shower safely   Time 12   Period Weeks    Status New     PT LONG TERM GOAL #5   Title Patient will increase BLE gross strength to 4+/5 as to improve functional strength for independent gait, increased standing tolerance and increased ADL ability.   Time 12   Period Weeks   Status New               Plan - 10/31/15 1755    Clinical Impression Statement  Muscle fatigue but no major pain complaints with strengthening exercises for Left ankle and knee. He is able to perform all exercises with fatigue.    Rehab Potential Good   PT Frequency 2x / week   PT Duration 12 weeks   PT Treatment/Interventions Electrical Stimulation;Gait training;Therapeutic activities;Therapeutic exercise;Balance training;Passive range of motion   PT Next Visit Plan balance and strengthening   PT Home Exercise Plan heel raises, calf stretch, ankle DF in supine   Consulted and Agree with Plan of Care Patient      Patient will benefit from skilled therapeutic intervention in order to improve the following deficits and impairments:  Difficulty walking, Decreased balance, Decreased strength, Decreased endurance, Decreased activity tolerance, Impaired sensation, Abnormal gait, Cardiopulmonary status limiting activity  Visit Diagnosis: Muscle weakness (generalized)  Difficulty in walking, not elsewhere classified     Problem List Patient Active Problem List   Diagnosis Date Noted  . Weakness of right lower extremity 09/19/2015  . Tobacco abuse counseling 09/19/2015  . Peripheral vascular disease ([place]) 09/19/2015  . Leukocytosis 09/19/2015  . CVA (cerebral infarction) 09/19/2015  . Paralysis ([place])   . Abnormal findings-gastrointestinal tract   . Gastritis   . Duodenal ulcer disease   . 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 08/29/2015  . Contact with and (suspected ) exposure to other hazardous substances 01/27/2014  . Degeneration of intervertebral disc of lumbar region 11/12/2013  . Degeneration of intervertebral disc of lumbosacral region 11/12/2013   . Neuritis or radiculitis due to rupture of lumbar intervertebral disc 11/12/2013  . Thoracic neuritis 11/12/2013  . Benign essential HTN 02/01/2013  Alanson Puls, PT, DPT  [place], [place] S 10/31/2015, 5:57 PM  [place] MAIN [facility] SERVICES [address] [place], [place], [address] Phone: [phone]   Fax:  [phone]  Name: Rockne Dearinger. MRN: [id number] Date of Birth: Jan 11, 1951

## 2015-11-02 ENCOUNTER — Ambulatory Visit: Payer: Managed Care, Other (non HMO) | Admitting: Physical Therapy

## 2015-11-02 ENCOUNTER — Encounter: Payer: Self-pay | Admitting: Physical Therapy

## 2015-11-02 DIAGNOSIS — R262 Difficulty in walking, not elsewhere classified: Secondary | ICD-10-CM

## 2015-11-02 DIAGNOSIS — M6281 Muscle weakness (generalized): Secondary | ICD-10-CM | POA: Diagnosis not present

## 2015-11-02 NOTE — Therapy (Signed)
[place] MAIN [facility] SERVICES [address] [place], [place], [address] Phone: [phone]   Fax:  [phone]  Physical Therapy Treatment  Patient Details  Name: Gary Mccoy. MRN: [id number] Date of Birth: 08/13/1950 Referring Provider: Rusty Aus  Encounter Date: 11/02/2015      PT End of Session - 11/02/15 1606    Visit Number 9   Number of Visits 24   Date for PT Re-Evaluation 2016/01/25   Authorization Type g codes    Authorization Time Period 9/10   PT Start Time 1600   PT Stop Time 1645   PT Time Calculation (min) 45 min   Equipment Utilized During Treatment Gait belt   Activity Tolerance Patient tolerated treatment well;Patient limited by fatigue;Patient limited by lethargy   Behavior During Therapy WFL for tasks assessed/performed      Past Medical History:  Diagnosis Date  . Arthritis    lower back  . Back pain   . Duodenal ulcer   . Elevated lipids   . GERD (gastroesophageal reflux disease)   . Leg weakness, bilateral    s/p lumbar surgery  . PAD (peripheral artery disease) ([place])   . Stroke ([place])    Rt lower leg  estimated 10 - 12 stokes in last 5 years  . Wears dentures    full upper and lower    Past Surgical History:  Procedure Laterality Date  . BACK SURGERY  2005  . [place]  . ESOPHAGOGASTRODUODENOSCOPY (EGD) WITH PROPOFOL N/A 09/04/2015   Procedure: ESOPHAGOGASTRODUODENOSCOPY (EGD) WITH PROPOFOL;  Surgeon: Lucilla Lame, MD;  Location: [place];  Service: Endoscopy;  Laterality: N/A;  . TEE WITHOUT CARDIOVERSION N/A 09/22/2015   Procedure: TRANSESOPHAGEAL ECHOCARDIOGRAM (TEE);  Surgeon: Teodoro Spray, MD;  Location: [facility];  Service: Cardiovascular;  Laterality: N/A;    There were no vitals filed for this visit.      Subjective Assessment - 11/02/15 1605    Subjective Pt is doing well. He continues to report improvement with R LE. His L LE is giving him trouble  occasionally due to his vascular issues.He is having surgery Aug 23rd. for vascular surgery.    Pertinent History arthritis in his back, back surgery 12 years ago   Limitations Walking;Standing   Patient Stated Goals to walk longer amounts of time and strengthen his right foot   Currently in Pain? No/denies   Multiple Pain Sites No     Theraputic exercise; BAPS board CCW, CW Tandem on 1/2 foam flat side up  2 disc tandem and trunk rotation Rocker board  Leg press heel raises x 20 x 3 100 lbs Patient needs occasional verbal cueing to improve posture and cueing to correctly perform exercises slowly, holding at end of range to increase motor firing of desired muscle to encourage fatigue.                            PT Education - 11/02/15 1606    Education provided Yes   Education Details heel toe gait   Person(s) Educated Patient   Methods Explanation   Comprehension Verbalized understanding             PT Long Term Goals - 10/04/15 1801      PT LONG TERM GOAL #1   Title Patient will be independent in home exercise program to improve strength/mobility for better functional independence with ADLs.  Time 12   Period Weeks   Status New     PT LONG TERM GOAL #2   Title Patient (> 56 years old) will complete five times sit to stand test in < 15 seconds indicating an increased LE strength and improved balance.   Time 12   Period Weeks   Status New     PT LONG TERM GOAL #3   Title Patient will ascend/descend 4 stairs without rail assist independently without loss of balance to improve ability to get in/out of home.    Time 12   Period Weeks   Status New     PT LONG TERM GOAL #4   Title Patient will tolerate 5 seconds of single leg stance without loss of balance to improve ability to get in and out of shower safely   Time 12   Period Weeks   Status New     PT LONG TERM GOAL #5   Title Patient will increase BLE gross strength to 4+/5 as to improve  functional strength for independent gait, increased standing tolerance and increased ADL ability.   Time 12   Period Weeks   Status New               Plan - 11/02/15 1607    Clinical Impression Statement Patient has weakness and his left ankle is weak but is slapping the ground less often and his left toe is not catching as often.    Rehab Potential Good   PT Frequency 2x / week   PT Duration 12 weeks   PT Treatment/Interventions Electrical Stimulation;Gait training;Therapeutic activities;Therapeutic exercise;Balance training;Passive range of motion   PT Next Visit Plan balance and strengthening   PT Home Exercise Plan heel raises, calf stretch, ankle DF in supine   Consulted and Agree with Plan of Care Patient      Patient will benefit from skilled therapeutic intervention in order to improve the following deficits and impairments:  Difficulty walking, Decreased balance, Decreased strength, Decreased endurance, Decreased activity tolerance, Impaired sensation, Abnormal gait, Cardiopulmonary status limiting activity  Visit Diagnosis: Muscle weakness (generalized)  Difficulty in walking, not elsewhere classified     Problem List Patient Active Problem List   Diagnosis Date Noted  . Weakness of right lower extremity 09/19/2015  . Tobacco abuse counseling 09/19/2015  . Peripheral vascular disease ([place]) 09/19/2015  . Leukocytosis 09/19/2015  . CVA (cerebral infarction) 09/19/2015  . Paralysis ([place])   . Abnormal findings-gastrointestinal tract   . Gastritis   . Duodenal ulcer disease   . 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 08/29/2015  . Contact with and (suspected ) exposure to other hazardous substances 01/27/2014  . Degeneration of intervertebral disc of lumbar region 11/12/2013  . Degeneration of intervertebral disc of lumbosacral region 11/12/2013  . Neuritis or radiculitis due to rupture of lumbar intervertebral disc 11/12/2013  . Thoracic neuritis 11/12/2013  . Benign  essential HTN 02/01/2013   Alanson Puls, PT, DPT [place], [place] S 11/02/2015, 4:12 PM  [place] MAIN [facility] SERVICES [address] [place], [place], [address] Phone: [phone]   Fax:  [phone]  Name: Gary Mccoy. MRN: [id number] Date of Birth: 1950-07-23

## 2015-11-07 ENCOUNTER — Encounter: Payer: Self-pay | Admitting: Physical Therapy

## 2015-11-07 ENCOUNTER — Ambulatory Visit: Payer: Managed Care, Other (non HMO) | Admitting: Physical Therapy

## 2015-11-07 DIAGNOSIS — R262 Difficulty in walking, not elsewhere classified: Secondary | ICD-10-CM

## 2015-11-07 DIAGNOSIS — M6281 Muscle weakness (generalized): Secondary | ICD-10-CM | POA: Diagnosis not present

## 2015-11-07 NOTE — Therapy (Signed)
[place] MAIN [facility] SERVICES [address] [place], [place], [address] Phone: [phone]   Fax:  [phone]  Physical Therapy Treatment/DC Summary  Patient Details  Name: Gary Mccoy. MRN: [id number] Date of Birth: 11-May-1950 Referring Provider: Rusty Aus  Encounter Date: 11/07/2015      PT End of Session - 11/07/15 1747    Visit Number 10   Number of Visits 24   Date for PT Re-Evaluation 2016/01/25   Authorization Type g codes    Authorization Time Period 10/10   PT Start Time 1745   PT Stop Time 1825   PT Time Calculation (min) 40 min   Equipment Utilized During Treatment Gait belt   Activity Tolerance Patient tolerated treatment well;Patient limited by fatigue;Patient limited by lethargy   Behavior During Therapy WFL for tasks assessed/performed      Past Medical History:  Diagnosis Date  . Arthritis    lower back  . Back pain   . Duodenal ulcer   . Elevated lipids   . GERD (gastroesophageal reflux disease)   . Leg weakness, bilateral    s/p lumbar surgery  . PAD (peripheral artery disease) ([place])   . Stroke ([place])    Rt lower leg  estimated 10 - 12 stokes in last 5 years  . Wears dentures    full upper and lower    Past Surgical History:  Procedure Laterality Date  . BACK SURGERY  2005  . [place]  . ESOPHAGOGASTRODUODENOSCOPY (EGD) WITH PROPOFOL N/A 09/04/2015   Procedure: ESOPHAGOGASTRODUODENOSCOPY (EGD) WITH PROPOFOL;  Surgeon: Lucilla Lame, MD;  Location: [place];  Service: Endoscopy;  Laterality: N/A;  . TEE WITHOUT CARDIOVERSION N/A 09/22/2015   Procedure: TRANSESOPHAGEAL ECHOCARDIOGRAM (TEE);  Surgeon: Teodoro Spray, MD;  Location: [facility];  Service: Cardiovascular;  Laterality: N/A;    There were no vitals filed for this visit.      Subjective Assessment - 11/07/15 1746    Subjective Pt is doing well. He continues to report improvement with R LE. His L LE is giving  him trouble occasionally due to his vascular issues.He is having surgery Aug 23rd. for vascular surgery.    Pertinent History arthritis in his back, back surgery 12 years ago   Limitations Walking;Standing   Patient Stated Goals to walk longer amounts of time and strengthen his right foot   Currently in Pain? No/denies   Pain Score 0-No pain   Multiple Pain Sites No      OUTCOME MEASURES: TEST Outcome Interpretation  5 times sit<>stand 8.45sec >60 yo, >15 sec indicates increased risk for falls  10 meter walk test   1.47             m/s <1.0 m/s indicates increased risk for falls; limited community ambulator  Timed up and Go     7.27         sec <14 sec indicates increased risk for falls  6 minute walk test    1100          Feet 1000 feet is community ambulator   Patient improved in all outcome measures and is being DC to HEP .  Therapeutic exercise: BAPS board medium ball and CCW and CW , DF/PF x 20 each way 1/2 foam standing tandem  Patient needs occasional verbal cueing to improve posture and cueing to correctly perform exercises slowly, holding at end of range to increase motor firing of desired  muscle to encourage fatigue.  Patient is having a vascular surgery to improve circulation and will be DC for PT.                            PT Education - 11/07/15 1747    Education provided Yes   Education Details heel toe gait   Person(s) Educated Patient   Methods Explanation   Comprehension Verbalized understanding             PT Long Term Goals - 12/05/15 1034      PT LONG TERM GOAL #1   Title Patient will be independent in home exercise program to improve strength/mobility for better functional independence with ADLs.   Time 8   Period Weeks   Status Achieved     PT LONG TERM GOAL #2   Title Patient (> 64 years old) will complete five times sit to stand test in < 15 seconds indicating an increased LE strength and improved balance.   Time 8    Period Weeks   Status Achieved     PT LONG TERM GOAL #3   Title Patient will ascend/descend 4 stairs without rail assist independently without loss of balance to improve ability to get in/out of home.    Time 8   Period Weeks   Status Partially Met     PT LONG TERM GOAL #4   Title Patient will tolerate 5 seconds of single leg stance without loss of balance to improve ability to get in and out of shower safely   Time 8   Period Weeks   Status Partially Met     PT LONG TERM GOAL #5   Title Patient will increase BLE gross strength to 4+/5 as to improve functional strength for independent gait, increased standing tolerance and increased ADL ability.   Time 8   Period Weeks   Status Partially Met               Plan - 11/07/15 1748    Clinical Impression Statement Patient continues to have left ankle/foot weakness and is improving with his outcome measures.   Rehab Potential Good   PT Frequency 2x / week   PT Duration 12 weeks   PT Treatment/Interventions Electrical Stimulation;Gait training;Therapeutic activities;Therapeutic exercise;Balance training;Passive range of motion   PT Next Visit Plan balance and strengthening   PT Home Exercise Plan heel raises, calf stretch, ankle DF in supine   Consulted and Agree with Plan of Care Patient      Patient will benefit from skilled therapeutic intervention in order to improve the following deficits and impairments:  Difficulty walking, Decreased balance, Decreased strength, Decreased endurance, Decreased activity tolerance, Impaired sensation, Abnormal gait, Cardiopulmonary status limiting activity  Visit Diagnosis: Muscle weakness (generalized)  Difficulty in walking, not elsewhere classified       G-Codes - 2015-12-05 1026    Functional Assessment Tool Used 5 x sit to stand, TUG, 6 MW, 10 MW   Functional Limitation Mobility: Walking and moving around   Mobility: Walking and Moving Around Current Status [phone]) At least 40  percent but less than 60 percent impaired, limited or restricted   Mobility: Walking and Moving Around Goal Status [phone]) At least 20 percent but less than 40 percent impaired, limited or restricted      Problem List Patient Active Problem List   Diagnosis Date Noted  . Weakness of right lower extremity 09/19/2015  . Tobacco  abuse counseling 09/19/2015  . Peripheral vascular disease ([place]) 09/19/2015  . Leukocytosis 09/19/2015  . CVA (cerebral infarction) 09/19/2015  . Paralysis ([place])   . Abnormal findings-gastrointestinal tract   . Gastritis   . Duodenal ulcer disease   . 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 08/29/2015  . Contact with and (suspected ) exposure to other hazardous substances 01/27/2014  . Degeneration of intervertebral disc of lumbar region 11/12/2013  . Degeneration of intervertebral disc of lumbosacral region 11/12/2013  . Neuritis or radiculitis due to rupture of lumbar intervertebral disc 11/12/2013  . Thoracic neuritis 11/12/2013  . Benign essential HTN 02/01/2013   Alanson Puls, PT, DPT Gregory, Minette Headland S 11/08/2015, 10:35 AM  [place] MAIN [facility] SERVICES [address] [place], [place], [address] Phone: [phone]   Fax:  [phone]  Name: Mabry Tift. MRN: [id number] Date of Birth: [birth year]

## 2015-11-08 ENCOUNTER — Inpatient Hospital Stay
Admission: RE | Admit: 2015-11-08 | Discharge: 2015-11-15 | DRG: 268 | Disposition: A | Discharge status: Home / Self Care | Payer: Managed Care, Other (non HMO) | Source: Ambulatory Visit | Attending: Vascular Surgery | Admitting: Vascular Surgery

## 2015-11-08 ENCOUNTER — Inpatient Hospital Stay: Payer: Managed Care, Other (non HMO) | Admitting: Certified Registered Nurse Anesthetist

## 2015-11-08 ENCOUNTER — Encounter
Admission: RE | Disposition: A | Discharge status: Home / Self Care | Payer: Self-pay | Source: Ambulatory Visit | Attending: Vascular Surgery

## 2015-11-08 ENCOUNTER — Encounter: Payer: Self-pay | Admitting: Physical Therapy

## 2015-11-08 ENCOUNTER — Encounter: Payer: Self-pay | Admitting: *Deleted

## 2015-11-08 ENCOUNTER — Inpatient Hospital Stay: Payer: Managed Care, Other (non HMO)

## 2015-11-08 DIAGNOSIS — J9600 Acute respiratory failure, unspecified whether with hypoxia or hypercapnia: Secondary | ICD-10-CM | POA: Diagnosis not present

## 2015-11-08 DIAGNOSIS — I70229 Atherosclerosis of native arteries of extremities with rest pain, unspecified extremity: Secondary | ICD-10-CM | POA: Diagnosis present

## 2015-11-08 DIAGNOSIS — J439 Emphysema, unspecified: Secondary | ICD-10-CM | POA: Diagnosis present

## 2015-11-08 DIAGNOSIS — Z09 Encounter for follow-up examination after completed treatment for conditions other than malignant neoplasm: Secondary | ICD-10-CM

## 2015-11-08 DIAGNOSIS — Y92239 Unspecified place in hospital as the place of occurrence of the external cause: Secondary | ICD-10-CM | POA: Diagnosis not present

## 2015-11-08 DIAGNOSIS — Z8673 Personal history of transient ischemic attack (TIA), and cerebral infarction without residual deficits: Secondary | ICD-10-CM | POA: Diagnosis not present

## 2015-11-08 DIAGNOSIS — G9340 Encephalopathy, unspecified: Secondary | ICD-10-CM | POA: Diagnosis not present

## 2015-11-08 DIAGNOSIS — Z7901 Long term (current) use of anticoagulants: Secondary | ICD-10-CM

## 2015-11-08 DIAGNOSIS — K219 Gastro-esophageal reflux disease without esophagitis: Secondary | ICD-10-CM | POA: Diagnosis present

## 2015-11-08 DIAGNOSIS — I70212 Atherosclerosis of native arteries of extremities with intermittent claudication, left leg: Secondary | ICD-10-CM | POA: Diagnosis present

## 2015-11-08 DIAGNOSIS — G92 Toxic encephalopathy: Secondary | ICD-10-CM | POA: Diagnosis not present

## 2015-11-08 DIAGNOSIS — Z833 Family history of diabetes mellitus: Secondary | ICD-10-CM

## 2015-11-08 DIAGNOSIS — Z79899 Other long term (current) drug therapy: Secondary | ICD-10-CM | POA: Diagnosis not present

## 2015-11-08 DIAGNOSIS — I700 Atherosclerosis of aorta: Secondary | ICD-10-CM | POA: Diagnosis present

## 2015-11-08 DIAGNOSIS — Z7982 Long term (current) use of aspirin: Secondary | ICD-10-CM

## 2015-11-08 DIAGNOSIS — N179 Acute kidney failure, unspecified: Secondary | ICD-10-CM | POA: Diagnosis not present

## 2015-11-08 DIAGNOSIS — I70221 Atherosclerosis of native arteries of extremities with rest pain, right leg: Secondary | ICD-10-CM | POA: Diagnosis present

## 2015-11-08 DIAGNOSIS — Z8711 Personal history of peptic ulcer disease: Secondary | ICD-10-CM | POA: Diagnosis not present

## 2015-11-08 DIAGNOSIS — I4891 Unspecified atrial fibrillation: Secondary | ICD-10-CM | POA: Diagnosis present

## 2015-11-08 DIAGNOSIS — F17210 Nicotine dependence, cigarettes, uncomplicated: Secondary | ICD-10-CM | POA: Diagnosis present

## 2015-11-08 DIAGNOSIS — T402X5A Adverse effect of other opioids, initial encounter: Secondary | ICD-10-CM | POA: Diagnosis not present

## 2015-11-08 DIAGNOSIS — D72828 Other elevated white blood cell count: Secondary | ICD-10-CM | POA: Diagnosis present

## 2015-11-08 DIAGNOSIS — I10 Essential (primary) hypertension: Secondary | ICD-10-CM | POA: Diagnosis present

## 2015-11-08 DIAGNOSIS — R4182 Altered mental status, unspecified: Secondary | ICD-10-CM

## 2015-11-08 DIAGNOSIS — R0600 Dyspnea, unspecified: Secondary | ICD-10-CM

## 2015-11-08 DIAGNOSIS — Z8249 Family history of ischemic heart disease and other diseases of the circulatory system: Secondary | ICD-10-CM | POA: Diagnosis not present

## 2015-11-08 DIAGNOSIS — I4581 Long QT syndrome: Secondary | ICD-10-CM | POA: Diagnosis not present

## 2015-11-08 DIAGNOSIS — T424X5A Adverse effect of benzodiazepines, initial encounter: Secondary | ICD-10-CM | POA: Diagnosis not present

## 2015-11-08 DIAGNOSIS — R410 Disorientation, unspecified: Secondary | ICD-10-CM | POA: Diagnosis not present

## 2015-11-08 DIAGNOSIS — Z452 Encounter for adjustment and management of vascular access device: Secondary | ICD-10-CM

## 2015-11-08 HISTORY — PX: AORTA - BILATERAL FEMORAL ARTERY BYPASS GRAFT: SHX1175

## 2015-11-08 HISTORY — PX: ENDARTERECTOMY FEMORAL: SHX5804

## 2015-11-08 LAB — BASIC METABOLIC PANEL
Anion gap: 5 (ref 5–15)
BUN: 15 mg/dL (ref 6–20)
CALCIUM: 8 mg/dL — AB (ref 8.9–10.3)
CO2: 22 mmol/L (ref 22–32)
CREATININE: 0.98 mg/dL (ref 0.61–1.24)
Chloride: 111 mmol/L (ref 101–111)
GFR calc Af Amer: >60 mL/min (ref >60)
Glucose, Bld: 139 mg/dL — ABNORMAL HIGH (ref 65–99)
Potassium: 4.4 mmol/L (ref 3.5–5.1)
SODIUM: 138 mmol/L (ref 135–145)

## 2015-11-08 LAB — CBC
HCT: 43.7 % (ref 40.0–52.0)
Hemoglobin: 14.7 g/dL (ref 13.0–18.0)
MCH: 31.1 pg (ref 26.0–34.0)
MCHC: 33.7 g/dL (ref 32.0–36.0)
MCV: 92.5 fL (ref 80.0–100.0)
PLATELETS: 145 10*3/uL — AB (ref 150–440)
RBC: 4.73 MIL/uL (ref 4.40–5.90)
RDW: 15.9 % — AB (ref 11.5–14.5)
WBC: 18.8 10*3/uL — ABNORMAL HIGH (ref 3.8–10.6)

## 2015-11-08 LAB — ABO/RH: ABO/RH(D): O POS

## 2015-11-08 LAB — PROTIME-INR
INR: 1.21
PROTHROMBIN TIME: 15.4 s — AB (ref 11.4–15.2)

## 2015-11-08 LAB — APTT: aPTT: 34 seconds (ref 24–36)

## 2015-11-08 LAB — MAGNESIUM: Magnesium: 1.6 mg/dL — ABNORMAL LOW (ref 1.7–2.4)

## 2015-11-08 SURGERY — CREATION, BYPASS, ARTERIAL, AORTA TO FEMORAL, BILATERAL, USING GRAFT
Anesthesia: General | Wound class: Clean

## 2015-11-08 MED ORDER — DIPHENHYDRAMINE HCL 50 MG/ML IJ SOLN
25.0000 mg | Freq: Four times a day (QID) | INTRAMUSCULAR | Status: DC | PRN
  Administered 2015-11-10: 25 mg via INTRAVENOUS
  Filled 2015-11-08: qty 1

## 2015-11-08 MED ORDER — PHENOL 1.4 % MT LIQD
1.0000 | OROMUCOSAL | Status: DC | PRN
  Filled 2015-11-08: qty 177

## 2015-11-08 MED ORDER — PHENYLEPHRINE HCL 10 MG/ML IJ SOLN
INTRAVENOUS | Status: DC | PRN
  Administered 2015-11-08: 25 ug/min via INTRAVENOUS

## 2015-11-08 MED ORDER — ACETAMINOPHEN 10 MG/ML IV SOLN
INTRAVENOUS | Status: DC | PRN
  Administered 2015-11-08: 1000 mg via INTRAVENOUS

## 2015-11-08 MED ORDER — SODIUM CHLORIDE 0.9 % IV SOLN
INTRAVENOUS | Status: DC | PRN
  Administered 2015-11-08: 100 mL via INTRAMUSCULAR

## 2015-11-08 MED ORDER — MIDAZOLAM HCL 2 MG/2ML IJ SOLN
INTRAMUSCULAR | Status: DC | PRN
  Administered 2015-11-08: 1 mg via INTRAVENOUS

## 2015-11-08 MED ORDER — LACTATED RINGERS IV SOLN
INTRAVENOUS | Status: DC
  Administered 2015-11-08 (×4): via INTRAVENOUS

## 2015-11-08 MED ORDER — SODIUM CHLORIDE 0.9 % IJ SOLN
INTRAMUSCULAR | Status: AC
  Filled 2015-11-08: qty 10

## 2015-11-08 MED ORDER — FAMOTIDINE 20 MG PO TABS
20.0000 mg | ORAL_TABLET | Freq: Once | ORAL | Status: AC
  Administered 2015-11-08: 20 mg via ORAL

## 2015-11-08 MED ORDER — SODIUM CHLORIDE 0.9 % IV SOLN: 500.0000 mL | Freq: Once | INTRAVENOUS | Status: DC | PRN

## 2015-11-08 MED ORDER — PHENYLEPHRINE HCL 10 MG/ML IJ SOLN
INTRAMUSCULAR | Status: DC | PRN
  Administered 2015-11-08: 50 ug via INTRAVENOUS
  Administered 2015-11-08: 200 ug via INTRAVENOUS
  Administered 2015-11-08: 300 ug via INTRAVENOUS

## 2015-11-08 MED ORDER — PROPOFOL 10 MG/ML IV BOLUS
INTRAVENOUS | Status: DC | PRN
  Administered 2015-11-08: 180 mg via INTRAVENOUS

## 2015-11-08 MED ORDER — FAMOTIDINE 20 MG PO TABS
ORAL_TABLET | ORAL | Status: AC
  Filled 2015-11-08: qty 1

## 2015-11-08 MED ORDER — MENTHOL 3 MG MT LOZG
1.0000 | LOZENGE | OROMUCOSAL | Status: DC | PRN
  Administered 2015-11-08: 3 mg via ORAL
  Filled 2015-11-08: qty 9

## 2015-11-08 MED ORDER — ONDANSETRON HCL 4 MG/2ML IJ SOLN: 4.0000 mg | Freq: Once | INTRAMUSCULAR | Status: DC | PRN

## 2015-11-08 MED ORDER — LABETALOL HCL 5 MG/ML IV SOLN
INTRAVENOUS | Status: DC | PRN
  Administered 2015-11-08: 10 mg via INTRAVENOUS

## 2015-11-08 MED ORDER — LORAZEPAM 2 MG/ML IJ SOLN
0.5000 mg | INTRAMUSCULAR | Status: DC | PRN
  Administered 2015-11-08 (×2): 0.5 mg via INTRAVENOUS
  Administered 2015-11-09: 1 mg via INTRAVENOUS
  Filled 2015-11-08 (×3): qty 1

## 2015-11-08 MED ORDER — HYDROMORPHONE HCL 1 MG/ML IJ SOLN
INTRAMUSCULAR | Status: DC | PRN
  Administered 2015-11-08: 3 mg via INTRAVENOUS

## 2015-11-08 MED ORDER — DEXMEDETOMIDINE HCL IN NACL 200 MCG/50ML IV SOLN
INTRAVENOUS | Status: DC | PRN
  Administered 2015-11-08: 16 ug via INTRAVENOUS

## 2015-11-08 MED ORDER — KETOROLAC TROMETHAMINE 30 MG/ML IJ SOLN: 30.0000 mg | Freq: Four times a day (QID) | INTRAMUSCULAR | Status: DC

## 2015-11-08 MED ORDER — FENTANYL CITRATE (PF) 100 MCG/2ML IJ SOLN
INTRAMUSCULAR | Status: DC | PRN
  Administered 2015-11-08: 200 ug via INTRAVENOUS
  Administered 2015-11-08: 250 ug via INTRAVENOUS
  Administered 2015-11-08: 100 ug via INTRAVENOUS
  Administered 2015-11-08: 300 ug via INTRAVENOUS
  Administered 2015-11-08: 50 ug via INTRAVENOUS

## 2015-11-08 MED ORDER — KETAMINE HCL 50 MG/ML IJ SOLN
INTRAMUSCULAR | Status: DC | PRN
  Administered 2015-11-08: 50 mg via INTRAVENOUS

## 2015-11-08 MED ORDER — SUGAMMADEX SODIUM 200 MG/2ML IV SOLN
INTRAVENOUS | Status: DC | PRN
  Administered 2015-11-08: 400 mg via INTRAVENOUS

## 2015-11-08 MED ORDER — HEPARIN SODIUM (PORCINE) 5000 UNIT/ML IJ SOLN
INTRAMUSCULAR | Status: AC
  Filled 2015-11-08: qty 1

## 2015-11-08 MED ORDER — EVICEL 5 ML EX KIT
PACK | CUTANEOUS | Status: DC | PRN
  Administered 2015-11-08: 5 mL via TOPICAL

## 2015-11-08 MED ORDER — LABETALOL HCL 5 MG/ML IV SOLN: 10.0000 mg | INTRAVENOUS | Status: DC | PRN

## 2015-11-08 MED ORDER — EVICEL 5 ML EX KIT
PACK | CUTANEOUS | Status: AC
  Filled 2015-11-08: qty 2

## 2015-11-08 MED ORDER — ACETAMINOPHEN 10 MG/ML IV SOLN
1000.0000 mg | Freq: Four times a day (QID) | INTRAVENOUS | Status: DC
Start: 2015-11-09 — End: 2015-11-08

## 2015-11-08 MED ORDER — ACETAMINOPHEN 10 MG/ML IV SOLN
1000.0000 mg | Freq: Four times a day (QID) | INTRAVENOUS | Status: AC | PRN
  Administered 2015-11-09 (×3): 1000 mg via INTRAVENOUS
  Filled 2015-11-08 (×5): qty 100

## 2015-11-08 MED ORDER — CEFAZOLIN IN D5W 1 GM/50ML IV SOLN
1.0000 g | Freq: Three times a day (TID) | INTRAVENOUS | Status: AC
  Administered 2015-11-08 – 2015-11-09 (×3): 1 g via INTRAVENOUS
  Filled 2015-11-08 (×4): qty 50

## 2015-11-08 MED ORDER — MORPHINE SULFATE (PF) 2 MG/ML IV SOLN: 2.0000 mg | INTRAVENOUS | Status: DC | PRN

## 2015-11-08 MED ORDER — HEPARIN SODIUM (PORCINE) 1000 UNIT/ML IJ SOLN
INTRAMUSCULAR | Status: DC | PRN
  Administered 2015-11-08: 4000 [IU] via INTRAVENOUS

## 2015-11-08 MED ORDER — SODIUM CHLORIDE 0.9 % IJ SOLN
INTRAMUSCULAR | Status: AC
  Filled 2015-11-08: qty 20

## 2015-11-08 MED ORDER — NITROGLYCERIN IN D5W 200-5 MCG/ML-% IV SOLN
INTRAVENOUS | Status: DC | PRN
  Administered 2015-11-08: 5 ug/min via INTRAVENOUS

## 2015-11-08 MED ORDER — FENTANYL CITRATE (PF) 100 MCG/2ML IJ SOLN
25.0000 ug | INTRAMUSCULAR | Status: DC | PRN
  Administered 2015-11-08 (×4): 25 ug via INTRAVENOUS

## 2015-11-08 MED ORDER — ROCURONIUM BROMIDE 100 MG/10ML IV SOLN
INTRAVENOUS | Status: DC | PRN
  Administered 2015-11-08 (×2): 50 mg via INTRAVENOUS
  Administered 2015-11-08: 20 mg via INTRAVENOUS
  Administered 2015-11-08 (×2): 10 mg via INTRAVENOUS

## 2015-11-08 MED ORDER — HYDRALAZINE HCL 20 MG/ML IJ SOLN
5.0000 mg | INTRAMUSCULAR | Status: AC | PRN
  Administered 2015-11-09 – 2015-11-10 (×2): 5 mg via INTRAVENOUS
  Filled 2015-11-08 (×2): qty 1

## 2015-11-08 MED ORDER — CETYLPYRIDINIUM CHLORIDE 0.05 % MT LIQD
7.0000 mL | Freq: Two times a day (BID) | OROMUCOSAL | Status: DC
  Administered 2015-11-08 – 2015-11-11 (×5): 7 mL via OROMUCOSAL

## 2015-11-08 MED ORDER — IPRATROPIUM-ALBUTEROL 0.5-2.5 (3) MG/3ML IN SOLN
3.0000 mL | Freq: Four times a day (QID) | RESPIRATORY_TRACT | Status: DC | PRN
  Administered 2015-11-08 – 2015-11-09 (×2): 3 mL via RESPIRATORY_TRACT
  Filled 2015-11-08 (×2): qty 3

## 2015-11-08 MED ORDER — KETOROLAC TROMETHAMINE 30 MG/ML IJ SOLN
30.0000 mg | Freq: Four times a day (QID) | INTRAMUSCULAR | Status: AC | PRN
  Administered 2015-11-09 (×3): 30 mg via INTRAVENOUS
  Filled 2015-11-08 (×5): qty 1

## 2015-11-08 MED ORDER — POTASSIUM CHLORIDE CRYS ER 20 MEQ PO TBCR: 20.0000 meq | EXTENDED_RELEASE_TABLET | Freq: Every day | ORAL | Status: DC | PRN

## 2015-11-08 MED ORDER — LIDOCAINE HCL (CARDIAC) 20 MG/ML IV SOLN
INTRAVENOUS | Status: DC | PRN
  Administered 2015-11-08: 60 mg via INTRAVENOUS

## 2015-11-08 MED ORDER — EVICEL 5 ML EX KIT
PACK | CUTANEOUS | Status: DC | PRN
  Administered 2015-11-08: 5 mL

## 2015-11-08 MED ORDER — NITROGLYCERIN IN D5W 200-5 MCG/ML-% IV SOLN: 5.0000 ug/min | INTRAVENOUS | Status: DC

## 2015-11-08 MED ORDER — ACETAMINOPHEN 10 MG/ML IV SOLN
INTRAVENOUS | Status: AC
Start: 2015-11-08 — End: 2015-11-08
  Filled 2015-11-08: qty 100

## 2015-11-08 MED ORDER — ONDANSETRON HCL 4 MG/2ML IJ SOLN
INTRAMUSCULAR | Status: DC | PRN
  Administered 2015-11-08: 4 mg via INTRAVENOUS

## 2015-11-08 MED ORDER — FENTANYL CITRATE (PF) 100 MCG/2ML IJ SOLN
INTRAMUSCULAR | Status: AC
  Administered 2015-11-08: 25 ug via INTRAVENOUS
  Filled 2015-11-08: qty 2

## 2015-11-08 MED ORDER — EVICEL 5 ML EX KIT
PACK | CUTANEOUS | Status: AC
  Filled 2015-11-08: qty 1

## 2015-11-08 MED ORDER — GUAIFENESIN-DM 100-10 MG/5ML PO SYRP
15.0000 mL | ORAL_SOLUTION | ORAL | Status: DC | PRN
  Administered 2015-11-11 – 2015-11-14 (×12): 15 mL via ORAL
  Filled 2015-11-08 (×12): qty 15

## 2015-11-08 MED ORDER — METOPROLOL TARTRATE 5 MG/5ML IV SOLN
2.0000 mg | INTRAVENOUS | Status: DC | PRN
Start: 2015-11-08 — End: 2015-11-09

## 2015-11-08 MED ORDER — ALUM & MAG HYDROXIDE-SIMETH 200-200-20 MG/5ML PO SUSP: 15.0000 mL | ORAL | Status: DC | PRN

## 2015-11-08 MED ORDER — SODIUM CHLORIDE 0.9 % IV SOLN
INTRAVENOUS | Status: DC
  Administered 2015-11-08 – 2015-11-10 (×9): via INTRAVENOUS

## 2015-11-08 MED ORDER — NITROGLYCERIN IN D5W 200-5 MCG/ML-% IV SOLN
INTRAVENOUS | Status: AC
Start: 2015-11-08 — End: 2015-11-08
  Filled 2015-11-08: qty 250

## 2015-11-08 MED ORDER — HYDROMORPHONE HCL 1 MG/ML IJ SOLN
1.0000 mg | INTRAMUSCULAR | Status: DC | PRN
  Administered 2015-11-08: 1 mg via INTRAVENOUS
  Administered 2015-11-08 – 2015-11-09 (×2): 2 mg via INTRAVENOUS
  Filled 2015-11-08 (×2): qty 1
  Filled 2015-11-08 (×2): qty 2

## 2015-11-08 MED ORDER — ONDANSETRON HCL 4 MG/2ML IJ SOLN
4.0000 mg | Freq: Four times a day (QID) | INTRAMUSCULAR | Status: DC | PRN
  Administered 2015-11-08: 4 mg via INTRAVENOUS
  Filled 2015-11-08: qty 2

## 2015-11-08 MED ORDER — CEFAZOLIN SODIUM-DEXTROSE 2-4 GM/100ML-% IV SOLN
2.0000 g | INTRAVENOUS | Status: AC
  Administered 2015-11-08: 2 g via INTRAVENOUS

## 2015-11-08 MED ORDER — CEFAZOLIN SODIUM-DEXTROSE 2-4 GM/100ML-% IV SOLN
INTRAVENOUS | Status: AC
  Filled 2015-11-08: qty 100

## 2015-11-08 MED ORDER — MAGNESIUM SULFATE 2 GM/50ML IV SOLN
2.0000 g | Freq: Every day | INTRAVENOUS | Status: DC | PRN
  Filled 2015-11-08: qty 50

## 2015-11-08 MED ORDER — SODIUM CHLORIDE 0.9 % IV SOLN
INTRAVENOUS | Status: DC | PRN
  Administered 2015-11-08: 11:00:00 via INTRAVENOUS

## 2015-11-08 MED ORDER — FAMOTIDINE IN NACL 20-0.9 MG/50ML-% IV SOLN
20.0000 mg | Freq: Two times a day (BID) | INTRAVENOUS | Status: DC
  Administered 2015-11-08 – 2015-11-10 (×6): 20 mg via INTRAVENOUS
  Filled 2015-11-08 (×6): qty 50

## 2015-11-08 MED ORDER — LIDOCAINE HCL (PF) 4 % IJ SOLN
INTRAMUSCULAR | Status: DC | PRN
  Administered 2015-11-08: 4 mL via RESPIRATORY_TRACT

## 2015-11-08 SURGICAL SUPPLY — 101 items
APPLIER CLIP 11 MED OPEN (CLIP) ×4
APPLIER CLIP 13 LRG OPEN (CLIP)
APPLIER CLIP 9.375 SM OPEN (CLIP)
BAG COUNTER SPONGE EZ (MISCELLANEOUS) ×6 IMPLANT
BAG DECANTER FOR FLEXI CONT (MISCELLANEOUS) ×4 IMPLANT
BLADE SURG 15 STRL LF DISP TIS (BLADE) ×2 IMPLANT
BLADE SURG 15 STRL SS (BLADE) ×2
BLADE SURG SZ11 CARB STEEL (BLADE) ×4 IMPLANT
BOOT SUTURE AID YELLOW STND (SUTURE) ×8 IMPLANT
BRUSH SCRUB 4% CHG (MISCELLANEOUS) ×4 IMPLANT
CANISTER SUCT 3000ML (MISCELLANEOUS) ×4 IMPLANT
CATH ROBINSON RED A/P 14FR (CATHETERS) ×4 IMPLANT
CATH TRAY 16F METER LATEX (MISCELLANEOUS) ×4 IMPLANT
CELL SAVER AAL HAEMONETICS (MISCELLANEOUS) ×4
CELL SAVER ADDITIONAL TIME PER (MISCELLANEOUS) ×540
CELL SAVER AUTOCOAGULATE (MISCELLANEOUS) ×4
CELL SAVER COLL SVCS (MISCELLANEOUS) ×4
CELL SAVER FILTER REG 2-05 PAL (MISCELLANEOUS) ×4
CLIP APPLIE 11 MED OPEN (CLIP) ×2 IMPLANT
CLIP APPLIE 13 LRG OPEN (CLIP) IMPLANT
CLIP APPLIE 9.375 SM OPEN (CLIP) IMPLANT
COUNTER NEEDLE 20/40 LG (NEEDLE) ×4 IMPLANT
COUNTER SPONGE BAG EZ (MISCELLANEOUS) ×2
COVER PROBE FLX POLY STRL (MISCELLANEOUS) ×4 IMPLANT
DECANTER SPIKE VIAL GLASS SM (MISCELLANEOUS) IMPLANT
DRAPE INCISE IOBAN 66X45 STRL (DRAPES) ×4 IMPLANT
DRAPE INCISE IOBAN 66X60 STRL (DRAPES) ×4 IMPLANT
DRAPE MAG INST 16X20 L/F (DRAPES) IMPLANT
DRAPE SHEET LG 3/4 BI-LAMINATE (DRAPES) ×4 IMPLANT
DRAPE TABLE BACK 80X90 (DRAPES) ×4 IMPLANT
DRESSING SURGICEL FIBRLLR 1X2 (HEMOSTASIS) ×10 IMPLANT
DRSG OPSITE POSTOP 4X14 (GAUZE/BANDAGES/DRESSINGS) ×8 IMPLANT
DRSG OPSITE POSTOP 4X6 (GAUZE/BANDAGES/DRESSINGS) ×8 IMPLANT
DRSG SURGICEL FIBRILLAR 1X2 (HEMOSTASIS) ×20
DRSG TEGADERM 4X10 (GAUZE/BANDAGES/DRESSINGS) IMPLANT
DRSG TEGADERM 4X4.75 (GAUZE/BANDAGES/DRESSINGS) IMPLANT
DRSG TELFA 3X8 NADH (GAUZE/BANDAGES/DRESSINGS) IMPLANT
DURAPREP 26ML APPLICATOR (WOUND CARE) ×8 IMPLANT
ELECT BLADE 6.5 EXT (BLADE) ×4 IMPLANT
ELECT CAUTERY BLADE 6.4 (BLADE) ×8 IMPLANT
ELECT REM PT RETURN 9FT ADLT (ELECTROSURGICAL) ×8
ELECTRODE REM PT RTRN 9FT ADLT (ELECTROSURGICAL) ×4 IMPLANT
FILTER REG 2-05 PAL CELL SAVER (MISCELLANEOUS) ×2 IMPLANT
GAUZE SPONGE 4X4 12PLY STRL (GAUZE/BANDAGES/DRESSINGS) IMPLANT
GLOVE BIO SURGEON STRL SZ7 (GLOVE) ×16 IMPLANT
GLOVE INDICATOR 7.5 STRL GRN (GLOVE) ×12 IMPLANT
GLOVE SURG SYN 8.0 (GLOVE) ×8 IMPLANT
GOWN STRL REUS W/ TWL LRG LVL3 (GOWN DISPOSABLE) ×6 IMPLANT
GOWN STRL REUS W/ TWL XL LVL3 (GOWN DISPOSABLE) ×4 IMPLANT
GOWN STRL REUS W/TWL LRG LVL3 (GOWN DISPOSABLE) ×6
GOWN STRL REUS W/TWL XL LVL3 (GOWN DISPOSABLE) ×4
GRAFT VASC KNIT 16MM08MM 50CM (Graft) ×4 IMPLANT
HANDLE YANKAUER SUCT BULB TIP (MISCELLANEOUS) ×4 IMPLANT
IV CONNECTOR CLAVE PORT MALE (IV SETS) ×12 IMPLANT
KIT CATH CVC 3 LUMEN 7FR 8IN (MISCELLANEOUS) ×4 IMPLANT
KIT RM TURNOVER STRD PROC AR (KITS) ×4 IMPLANT
LABEL OR SOLS (LABEL) ×4 IMPLANT
LOOP RED MAXI  1X406MM (MISCELLANEOUS) ×8
LOOP VESSEL MAXI 1X406 RED (MISCELLANEOUS) ×8 IMPLANT
LOOP VESSEL MINI 0.8X406 BLUE (MISCELLANEOUS) ×4 IMPLANT
LOOP VESSEL SUPERMAXI WHITE (MISCELLANEOUS) ×4 IMPLANT
LOOPS BLUE MINI 0.8X406MM (MISCELLANEOUS) ×4
NEEDLE FILTER BLUNT 18X 1/2SAF (NEEDLE) ×2
NEEDLE FILTER BLUNT 18X1 1/2 (NEEDLE) ×2 IMPLANT
NS IRRIG 1000ML POUR BTL (IV SOLUTION) ×8 IMPLANT
PACK BASIN MAJOR ARMC (MISCELLANEOUS) ×4 IMPLANT
PACK UNIVERSAL (MISCELLANEOUS) ×4 IMPLANT
PENCIL ELECTRO HAND CTR (MISCELLANEOUS) ×4 IMPLANT
RETAINER VISCERA MED (MISCELLANEOUS) ×4 IMPLANT
SAVE CELL AUTOCOAG (MISCELLANEOUS) ×2 IMPLANT
SAVER CELL COLL SVCS (MISCELLANEOUS) ×2 IMPLANT
SAVER CELL HAEMONETICS (MISCELLANEOUS) ×2 IMPLANT
SPONGE LAP 18X18 5 PK (GAUZE/BANDAGES/DRESSINGS) ×8 IMPLANT
SPONGE LAP 18X36 2PK (MISCELLANEOUS) ×4 IMPLANT
STAPLER SKIN PROX 35W (STAPLE) ×8 IMPLANT
SUT ETHIBOND 3 0 SH 1 (SUTURE) IMPLANT
SUT MNCRL 4-0 (SUTURE) ×4
SUT MNCRL 4-0 27XMFL (SUTURE) ×4
SUT PDS AB 1 TP1 96 (SUTURE) ×8 IMPLANT
SUT PROLENE 3 0 SH DA (SUTURE) ×20 IMPLANT
SUT PROLENE 4 0 SH DA (SUTURE) ×12 IMPLANT
SUT PROLENE 6 0 BV (SUTURE) ×36 IMPLANT
SUT PROLENE 6 0 C 1 30 (SUTURE) ×24 IMPLANT
SUT PROLENE BV 1 BLUE 7-0 30IN (SUTURE) ×8 IMPLANT
SUT SILK 2 0 (SUTURE) ×2
SUT SILK 2 0 SH (SUTURE) IMPLANT
SUT SILK 2-0 18XBRD TIE 12 (SUTURE) ×2 IMPLANT
SUT SILK 3 0 (SUTURE) ×4
SUT SILK 3-0 18XBRD TIE 12 (SUTURE) ×4 IMPLANT
SUT SILK 4 0 (SUTURE) ×2
SUT SILK 4-0 18XBRD TIE 12 (SUTURE) ×2 IMPLANT
SUT VIC AB 0 CT1 36 (SUTURE) ×8 IMPLANT
SUT VIC AB 2-0 CT1 (SUTURE) ×12 IMPLANT
SUT VICRYL+ 3-0 36IN CT-1 (SUTURE) ×12 IMPLANT
SUTURE MNCRL 4-0 27XMF (SUTURE) ×4 IMPLANT
SYR 20CC LL (SYRINGE) ×4 IMPLANT
SYR 3ML LL SCALE MARK (SYRINGE) ×4 IMPLANT
SYR BULB IRRIG 60ML STRL (SYRINGE) ×4 IMPLANT
TAPE UMBIL 1/8X18 RADIOPA (MISCELLANEOUS) ×4 IMPLANT
TIME ADDITIONAL PER CELL SAVER (MISCELLANEOUS) ×270 IMPLANT
TOWEL OR 17X26 4PK STRL BLUE (TOWEL DISPOSABLE) IMPLANT

## 2015-11-08 NOTE — Anesthesia Postprocedure Evaluation (Signed)
Anesthesia Post Note  Patient: Gary Mccoy.  Procedure(s) Performed: Procedure(s) (LRB): AORTA BIFEMORAL BYPASS GRAFT (N/A) ENDARTERECTOMY FEMORAL (Bilateral)  Patient location during evaluation: PACU Anesthesia Type: General Level of consciousness: awake Pain management: pain level controlled Vital Signs Assessment: post-procedure vital signs reviewed and stable Respiratory status: nonlabored ventilation Cardiovascular status: stable Anesthetic complications: no    Last Vitals:  Vitals:   11/08/15 1350 11/08/15 1400  BP: 109/71 103/74  Pulse: 68 71  Resp: 16 20  Temp:      Last Pain:  Vitals:   11/08/15 1310  PainSc: Asleep                 VAN STAVEREN,Karianne Nogueira

## 2015-11-08 NOTE — Progress Notes (Signed)
Dr. Delana Meyer present and gave order that patient may have ice chips, hard candy from home and cough drops from home.  MD also gave order for throat lozenges from pharmacy and that he has verified central line and NG tube and they are both okay for use.  MD also gave order for PRN ativan for abdominal spasms.

## 2015-11-08 NOTE — Progress Notes (Signed)
Dr. Delana Meyer aware no crossmatch ordered, he will Put order in computer, advised by lab they need An additonal lavender tube.  Lab coming to draw for pt. Charge RN aware.

## 2015-11-08 NOTE — Op Note (Signed)
OPERATIVE NOTE   PROCEDURE: 1. Insertion of triple-lumen central venous catheter right IJ approach.  PRE-OPERATIVE DIAGNOSIS: Atherosclerosis bilateral lower extremities with rest pain right foot  POST-OPERATIVE DIAGNOSIS: same  SURGEON: Katha Cabal M.D.  ANESTHESIA: general  ESTIMATED BLOOD LOSS: Minimal cc  INDICATIONS:   Gary Mccoy. is a 65 y.o. male who presents with ASO bilateral lower extremities with rest pain.  DESCRIPTION: After obtaining full informed written consent, the patient was positioned supine. The right neck was prepped and draped in a sterile fashion. Ultrasound was placed in a sterile sleeve. Ultrasound was utilized to identify the right IJ vein which is noted to be echolucent and compressible indicating patency. Images recorded for the permanent record. Under real-time visualization a Seldinger needle is inserted into the vein and the guidewires advanced without difficulty. Small counterincision was made at the wire insertion site. Dilators passed over the wire and the triple-lumen catheter is fed without difficulty.  All 3 lm aspirate and flush easily and are packed with heparin saline. Catheter secured to the skin of the right neck with 2-0 silk. A sterile dressing is applied with Biopatch.  COMPLICATIONS: none  CONDITION: unchanged  Katha Cabal, M.D. [facility]. Office:  [phone]   11/08/2015, 10:20 PM

## 2015-11-08 NOTE — Op Note (Signed)
OPERATIVE NOTE   PROCEDURE: 1. Aortobifemoral bypass grafting with 16 x 8 bifurcated dacryon graft 2.   Right common femoral endarterectomy 3.   Right superficial femoral endarterectomy 4.   Right profunda femoris endarterectomy 5.   Left common femoral endarterectomy 6.   Left profunda femoris endarterectomy 7.   Left superficial femoral endarterectomy 8.   Aortic endarterectomy  PRE-OPERATIVE DIAGNOSIS: 1.Atherosclerotic occlusive disease bilateral lower extremities with rest pain 2. Aortic atherosclerosis  POST-OPERATIVE DIAGNOSIS: Same  SURGEON: Aniket Paye, Dolores Lory CO-surgeon: Leotis Pain, M.D.   ANESTHESIA:  general  ESTIMATED BLOOD LOSS: 450 cc  FINDING(S): 1. Severe atherosclerotic changes to the common femoral profunda femoris and superficial femoral arteries bilaterally. The dissection and subsequently endarterectomy was carried out to the tertiary and quaternary branches of the deep femoral.  SPECIMEN(S): Plaque bilateral femoral artery systems  INDICATIONS:  Patient is a 65 year old woman who presents with rest pain right leg more severe than left.  The risks and benefits as well as alternative therapies including intervention were reviewed in detail all questions were answered the patient agrees to proceed with surgery.  DESCRIPTION: After obtaining full informed written consent, the patient was brought back to the operating room and placed supine upon the operating table. The patient received IV antibiotics prior to induction. After obtaining adequate anesthesia, the patient was prepped and draped in the standard fashion appropriate time out is called. Initially a central line is placed and this will be dictated as a separate note.  Working with myself on the right-hand side of the patient and Dr. Lucky Cowboy on the left vertical incisions are made in both groins and the dissection is carried down to expose the femoral sheath. Femoral sheath is entered and the common  femoral arteries identified. Dissection is then carried proximally to a level 1-2 cm above the ileo-inguinal ligament. The proximal common femoral artery is then looped with a Silastic vessel loop. The superficial femoral artery is then exposed for a distance of approximately 3-4 cm and looped with a Silastic vessel loop. The profunda femoris artery is then exposed working down to the tertiary branches using multiple blue Silastic Vesseloops each individual branches then looped with Silastic vessel loop. This is accomplished on both right and left sides. Blunt dissection is then used along the anterior wall of the artery to fashion a tunnel for the bypass graft. Attention is then turned to the abdomen  The midline incision is then created and the dissection carried down to expose the fascia. The peritoneal cavity is entered without difficulty just below the xiphoid process. As the incision is extended to the pubis. The omentum was then reflected superiorly and the small intestine is swept into the right gutter. Small intestine was then packed away with a large laparotomy pad which is moistened with saline.  The Omni-Tract retractor was then used to help facilitate our exposure.  The retroperitoneum is then opened along the midline overlying the pulse which is palpable at the level of the duodenum. The inferior mesenteric vein is identified and skeletonized, it is then ligated and divided between 2-0 silk ties so that the retroperitoneal tissues and duodenum can be mobilized and retracted superiorly and left laterally out of the field. The renal vein is then identified. At this point the Omni-Tract is repositioned to allow full exposure of the aorta.  The retroperitoneal dissection is then carried inferiorly past the palpable aortic bifurcation. Moving back superiorly the aorta is dissected circumferentially just above the level of the renal  vein, to the level of the palpable left renal artery. The aorta is  soft at this level and acceptable for clamp. It is then dissected along its right side taking care not to injure the vena cava. The dissection is then carried down to the right common iliac. Attention is then turned to the left common iliac which is dissected. Tunnels were then fashioned using blunt dissection along the anterior margin of the iliac arteries extending out to the groin incisions. And subsequently umbilical tapes are placed both right and left side.  4000 units of heparin was given and allowed circulate for 5 minutes. An 16 x 8 Dacron bifurcated graft is then selected and rehydrated on the back table. With myself working on the right side and Dr. Lucky Cowboy working on the left side the graft was implanted.  The aorta was clamped just below the level of the left renal artery (which is the lower of the renal arteries) and an aortotomy is made with 11 blade and extended with Metzenbaum scissors. With this maneuver the aorta was transected completely the aortic contents are then removed under direct visualization and endarterectomy of the aortic stump is completed. The aorta was forward flushed. The Dacron graft was then trimmed and applied in an end graft to end aortic anastomosis using running 3-0 Prolene. Flushing maneuvers were performed and the graft is clamped just above the suture line. At this point the distal end of the transected aorta is beveled and then oversewn with 3-0 Prolene using a horizontal mattress suture followed by an over and under suture.   Working on the left side first the left limb of the graft is pulled through the tunnel. It is then reflected superiorly out of the way. And in similar fashion the right limb of the graft is pulled through to the femoral incision.  Attention is then turned to the left common femoral artery.  An arteriotomy is made with 11 blade and extended with Potts scissors onto the profunda femoris. Endarterectomy is then performed with a Soil scientist under  direct visualization extending from the common femoral down into the superficial femoral artery. This endarterectomy is carried out through the entirety of the common femoral and extending into the superficial femoral by 2-3 cm. Interrupted 7-0 Prolene sutures are then used to tack the distal intimal edge within the superficial femoral artery. The left profunda femoris artery is then treated with endarterectomy using the eversion technique extending the endarterectomy to approximately 2 cm distal to the origin  The left limb of the graft is then approximated to the arteriotomy trimmed to an appropriate bevel and an end graft to side common and profunda femoris artery anastomosis is fashioned. 6-0 Prolene is used to sew the anastomosis. Flushing maneuvers were performed and flow was established first to the common femoral in a retrograde fashion and then the profunda femoris and superficial femoral. Easily palpable pulses are noted in the profunda femoris distal to the anastomosis. The graft itself has an excellent pulse.  Attention is then turned to the right common femoral artery.  An arteriotomy is made with 11 blade and extended with Potts scissors onto the profunda femoris. Endarterectomy is then performed with a Soil scientist under direct visualization extending from the common femoral down into the superficial femoral artery again for a distance of approximately 2-3 cm. 7-0 Prolene sutures are used to tack the distal intimal edge in an interrupted fashion.  The profunda femoris on the right is then treated with endarterectomy  using the eversion technique again extending the endarterectomy approximately 2 cm distal to the origin. The right limb of the graft is then approximated to the arteriotomy trimmed to an appropriate bevel and an end graft to side common femoral and profunda femoris artery anastomosis is fashioned. 6-0 Prolene is used to sew the anastomosis. Flushing maneuvers were performed and flow  was established first to the common femoral in a retrograde fashion and then the profunda femoris and superficial femoral arteries. Easily palpable pulses are noted in the profunda femoris distal to the anastomosis. The graft itself has an excellent pulse.  The retroperitoneal tissues are then irrigated with sterile saline and inspected for hemostasis Evicel with Surgicel is placed and the retroperitoneal tissues are reapproximated using running 0 Vicryl suture. The viscera was returned to its anatomic location and subsequently the fascia is closed with looped #1 PDS.  Attention is then turned to both groin incisions which were irrigated AdvaSeal and Surgicel are placed and the groins are reapproximated using a total of 4 layers of Vicryl with 2 layers of 2-0 Vicryl in a running fashion followed by 2 layers of 3-0 Vicryl in a running fashion followed by 4-0 Monocryl subcuticular. The abdominal incision is closed with surgical staples.   COMPLICATIONS: None  CONDITION: Gary Mccoy 09/21/2014 4:18 PM

## 2015-11-08 NOTE — Op Note (Signed)
OPERATIVE NOTE   PROCEDURE:   1.  Aortobifemoral bypass with 16 mm diameter proximal 8 mm diameter distal bifurcated Dacron graft  2. Left common femoral, profunda femoris, and superficial femoral artery endarterectomies  3.  Right common femoral, profunda femoris, and superficial femoral artery endarterectomies  4.   Aortic endarterectomy   PRE-OPERATIVE DIAGNOSIS: 1.Atherosclerotic occlusive disease bilateral lower extremities with rest pain 2. Aortic atherosclerosis   POST-OPERATIVE DIAGNOSIS: Same  SURGEON: DEW,JASON, MD  CO-surgeon: Katha Cabal, MD  ANESTHESIA: general  ESTIMATED BLOOD LOSS: 475 cc  FINDING(S): 1. Aortoiliac disease, significant plaque in bilateral common femoral, profunda femoris, and superficial femoral arteries  SPECIMEN(S): Bilateral common femoral, profunda femoris, and superficial femoral artery plaque. Aortic plaque  INDICATIONS:  Patient presents with severe atherosclerotic occlusive disease of the aorta and iliac arteries as well as occlusion of the right common femoral artery, and proximal superficial femoral profunda femoris artery. He has symptoms worrisome for rest pain.  Aortobifemoral bypass is planned for revascularization for limb salvage. The risks and benefits as well as alternative therapies including intervention were reviewed in detail all questions were answered the patient agrees to proceed with surgery.  DESCRIPTION: After obtaining full informed written consent, the patient was brought back to the operating room and placed supine upon the operating table. The patient received IV antibiotics prior to induction. After obtaining adequate anesthesia, the patient was prepped and draped in the standard fashion appropriate time out is called. Initially a central line is placed and this will be dictated as a separate note.  With myself working on the left and Dr. Delana Meyer working on the right we began by dissecting out the  femoral arteries on each side. Vertical incisions were created overlying both femoral arteries. The common femoral artery proximally, and superficial femoral artery, and primary profunda femoris artery branches were encircled with vessel loops and prepared for control. Both femoral arteries were found to have significant plaque from the common femoral artery into the profunda and superficial femoral arteries.  The midline incision is then created and the dissection carried down to expose the fascia. The peritoneal cavity is entered without difficulty just below the xiphoid process. As the incision is extended to the supraumbilical area. The omentum was then reflected superiorly and the small intestine is swept into the right gutter. Small intestine was then packed away with a large laparotomy pad which is moistened with saline. The Omni-Tract retractor was then used to help facilitate our exposure.  The retroperitoneum is then opened along the midline overlying the pulse which is palpable at the level of the duodenum. The inferior mesenteric vein is identified and ligated with silk ties and able to be retracted superiorly and left laterally out of the field. The renal vein is then identified. At this point the Omni-Tract is positioned to allow full exposure of the aorta.  The left renal artery which was the lower renal artery was identified and the artery was prepared for clamping at the base of the left renal artery. We circumferentially dissected the aorta and prepared a Satinsky clamp to clamp the aorta just below the left renal artery.  The retroperitoneal dissection is then carried inferiorly past the palpable aortic bifurcation. It is then dissected along its right side taking care not to injure the vena cava. The dissection is then carried down to the aortic bifurcation and right common iliac artery . Attention is then turned to the left common iliac which is dissected out and prepared for  control.    At this point we proceeded with the tunneling maneuvers taking care to stay below the ureters which were identified and protected from harm. Blunt dissection was used from the femoral incision up to the retroperitoneum and an umbilical tape was brought through the tunnel on each side and would later be exchanged for the graft.   4000 units of heparin was given and allowed circulate for 5 minutes. An 16 mm x 8 mm Dacron bifurcated graft is then selected and rehydrated on the back table. With myself working on the left side and Dr. Delana Meyer working on the right side the graft will be implanted.  The aorta was clamped just below the level of the left renal artery with a Satinsky clamp. Given the findings on CT scan, due to the significant thrombus and plaque within the aorta and intubated and anastomosis with aortic endarterectomy was planned. The artery was transected about 2 cm distal to the clamp. The distal aorta was oversewn with a 3-0 Prolene suture. We then used an elevator and Potts scissors and the aortic contents are then removed under direct visualization. There was some significant thick aortic plaque that was removed. All loose contents were removed. This left a nice healthy-appearing aorta for proximal anastomosis about 2 cm below the left renal artery. The aorta was forward flushed. The Dacron graft was then cut and applied in an end graft to end aortic anastomosis using running 3-0 Prolene. Flushing maneuvers were performed and flow was established back to the distal aorta the graft is clamped just above the suture line.    Then, using the umbilical tape as a guide, the left limb of the graft was tunneled in the retroperitoneal to the left femoral incision. Care was taken to keep orientation with the line on the Dacron graft upward.  Attention is then turned to the left femoral artery. An arteriotomy is made with 11 blade and extended with Potts scissors in the common femoral artery and  carried down onto the first 2-3 cm of the superficial femoral artery. An endarterectomy was then performed. The [facility] was used to create a plane. The proximal endpoint was cut flush with tenotomy scissors. This was in the mid common femoral artery. An eversion endarterectomy was then performed for the first 2-3 cm of the profunda femoris artery. Good backbleeding was then seen. The distal endpoint of the superficial femoral endarterectomy was created with gentle traction and the distal endpoint was then tacked down with 2 7-0 Prolene sutures about 1-2 cm into the superficial femoral artery. The left limb of the graft is then approximated to the arteriotomy, trimmed to an appropriate length and beveled and an end graft to side femoral artery anastomosis is fashioned including the graft over the origin of the superficial femoral artery over its first 2-3 cm. 6-0 Prolene is used to sew the anastomosis. Flushing maneuvers were performed and flow was established first to the profunda femoris artery and then to the superficial femoral artery. Easily palpable pulses are noted well beyond the anastomosis and both arteries.  The right femoral artery is then addressed. Arteriotomy is made in the common femoral artery and extended down into the first 3 cm of the superficial femoral artery. Similarly, an endarterectomy was performed with the [facility]. The proximal endpoint was cut flush with tenotomy scissors in the proximal common femoral artery.  The profunda femoris artery was addressed and treated with an eversion endarterectomy and this was performed with a hemostat  and gentle traction. The arteriotomy was carried down onto the proximal superficial femoral artery and the endarterectomy was continued to this point. The distal endpoint was tacked down with two 7-0 Prolene sutures. The graft was then cut and beveled to match the arteriotomy after tunneling this through the retroperitoneum. The  anastomosis created in an end to side fashion with a 6-0 Prolene suture, and a couple of 6-0 Prolene tacking sutures for hemostasis . Flushing maneuvers were performed and flow was reestablished to the femoral vessels, first to the profunda femoris artery then to the superficial femoral artery. Excellent pulses noted in the left superficial femoral and profunda femoris artery below the femoral anastomosis.  The retroperitoneal tissues are then irrigated with sterile saline and inspected for hemostasis Evicel with febrile are is placed and the retroperitoneal tissues are reapproximated using running 0 Vicryl suture. The viscera was returned to its anatomic location and subsequently the fascia is closed with looped #1 PDS skin is closed with staples.  Febrile are and Evicel topical hemostatic agents were placed in the femoral incisions and hemostasis was complete. The femoral incisions were then closed in a layered fashion with 2 layers of 2-0 Vicryl, 2 layers of 3-0 Vicryl, and 4-0 Monocryl for the skin closure. Dermabond and sterile dressing were then placed over all incisions.  The patient was then awakened from anesthesia and taken to the recovery room in stable condition having tolerated the procedure well.  COMPLICATIONS: None  CONDITION: Stable

## 2015-11-08 NOTE — Transfer of Care (Signed)
Immediate Anesthesia Transfer of Care Note  Patient: Gary Mccoy.  Procedure(s) Performed: Procedure(s) with comments: AORTA BIFEMORAL BYPASS GRAFT (N/A) ENDARTERECTOMY FEMORAL (Bilateral) - common, SFA, and profundis  Aortic endarterectomy   Patient Location: PACU  Anesthesia Type:General  Level of Consciousness: sedated  Airway & Oxygen Therapy: Patient Spontanous Breathing and Patient connected to face mask oxygen  Post-op Assessment: Report given to RN and Post -op Vital signs reviewed and stable  Post vital signs: Reviewed and stable  Last Vitals:  Vitals:   11/08/15 0625  BP: 127/74  Pulse: 75  Resp: 18  Temp: 36.8 C    Last Pain: There were no vitals filed for this visit.       Complications: No apparent anesthesia complications

## 2015-11-08 NOTE — Progress Notes (Signed)
Pharmacy Antibiotic Note  Gary Mccoy. is a 65 y.o. male admitted on 11/08/2015 with surgical prophylaxis.    Plan: Patient has normal renal function. No adjustments necessary.      Temp (24hrs), Avg:97.7 F (36.5 C), Min:96.8 F (36 C), Max:98.2 F (36.8 C)   Recent Labs Lab 11/08/15 1400  WBC 18.8*  CREATININE 0.98    CrCl cannot be calculated (Unknown ideal weight.).    No Known Allergies   Thank you for allowing pharmacy to be a part of this patient's care.  Ulice Dash D 11/08/2015 3:26 PM

## 2015-11-08 NOTE — Anesthesia Preprocedure Evaluation (Addendum)
Anesthesia Evaluation  Patient identified by MRN, date of birth, ID band Patient awake    Reviewed: Allergy & Precautions, NPO status , Patient's Chart, lab work & pertinent test results, reviewed documented beta blocker date and time   Airway Mallampati: II  TM Distance: >3 FB     Dental  (+) Chipped, Upper Dentures, Lower Dentures   Pulmonary Current Smoker,           Cardiovascular hypertension, + Peripheral Vascular Disease       Neuro/Psych  Neuromuscular disease CVA, Residual Symptoms    GI/Hepatic PUD, GERD  Controlled,  Endo/Other    Renal/GU      Musculoskeletal  (+) Arthritis ,   Abdominal   Peds  (+) premature delivery Hematology   Anesthesia Other Findings Active man, works 14 hrs a day. Echo 65%. Past hx of a stroke with some leg weakness. CXR ok.  Reproductive/Obstetrics                            Anesthesia Physical Anesthesia Plan  ASA: III  Anesthesia Plan: General   Post-op Pain Management:    Induction: Intravenous  Airway Management Planned: Oral ETT  Additional Equipment:   Intra-op Plan:   Post-operative Plan:   Informed Consent: I have reviewed the patients History and Physical, chart, labs and discussed the procedure including the risks, benefits and alternatives for the proposed anesthesia with the patient or authorized representative who has indicated his/her understanding and acceptance.     Plan Discussed with: CRNA  Anesthesia Plan Comments:         Anesthesia Quick Evaluation

## 2015-11-08 NOTE — H&P (Signed)
[facility] History & Physical Update  The patient was interviewed and re-examined.  [name] previous History and Physical has been reviewed and is unchanged.  There is no change in the plan of care. We plan to proceed with the scheduled procedure.  Sayvion Vigen, Dolores Lory, MD  11/08/2015, 7:26 AM

## 2015-11-08 NOTE — Progress Notes (Signed)
PT Cancellation Note  Patient Details Name: Gary Mccoy. MRN: [id number] DOB: 10/11/1950   Cancelled Treatment:    Reason Eval/Treat Not Completed: Other (comment).  PT consult received.  Chart reviewed.  Pt s/p surgery today and not back in room yet (still in PACU).  Will re-attempt PT eval at a later date/time as medically appropriate.   Leitha Bleak 11/08/2015, 1:57 PM Leitha Bleak, [place]

## 2015-11-09 ENCOUNTER — Encounter: Payer: Self-pay | Admitting: Vascular Surgery

## 2015-11-09 ENCOUNTER — Inpatient Hospital Stay: Payer: Managed Care, Other (non HMO)

## 2015-11-09 ENCOUNTER — Ambulatory Visit: Payer: Managed Care, Other (non HMO) | Admitting: Physical Therapy

## 2015-11-09 DIAGNOSIS — I70229 Atherosclerosis of native arteries of extremities with rest pain, unspecified extremity: Secondary | ICD-10-CM

## 2015-11-09 LAB — CBC
HEMATOCRIT: 41.6 % (ref 40.0–52.0)
HEMOGLOBIN: 14.1 g/dL (ref 13.0–18.0)
MCH: 31.5 pg (ref 26.0–34.0)
MCHC: 33.9 g/dL (ref 32.0–36.0)
MCV: 92.9 fL (ref 80.0–100.0)
Platelets: 116 10*3/uL — ABNORMAL LOW (ref 150–440)
RBC: 4.48 MIL/uL (ref 4.40–5.90)
RDW: 15.6 % — ABNORMAL HIGH (ref 11.5–14.5)
WBC: 10 10*3/uL (ref 3.8–10.6)

## 2015-11-09 LAB — GLUCOSE, CAPILLARY
GLUCOSE-CAPILLARY: 102 mg/dL — AB (ref 65–99)
GLUCOSE-CAPILLARY: 107 mg/dL — AB (ref 65–99)
Glucose-Capillary: 94 mg/dL (ref 65–99)
Glucose-Capillary: 92 mg/dL (ref 65–99)
Glucose-Capillary: 123 mg/dL — ABNORMAL HIGH (ref 65–99)

## 2015-11-09 LAB — BASIC METABOLIC PANEL
ANION GAP: 3 — AB (ref 5–15)
BUN: 18 mg/dL (ref 6–20)
CHLORIDE: 111 mmol/L (ref 101–111)
CO2: 25 mmol/L (ref 22–32)
Calcium: 7.8 mg/dL — ABNORMAL LOW (ref 8.9–10.3)
Creatinine, Ser: 1.19 mg/dL (ref 0.61–1.24)
GFR calc non Af Amer: >60 mL/min (ref >60)
Glucose, Bld: 111 mg/dL — ABNORMAL HIGH (ref 65–99)
Potassium: 4.9 mmol/L (ref 3.5–5.1)
Sodium: 139 mmol/L (ref 135–145)

## 2015-11-09 LAB — SURGICAL PATHOLOGY

## 2015-11-09 LAB — MAGNESIUM: Magnesium: 1.5 mg/dL — ABNORMAL LOW (ref 1.7–2.4)

## 2015-11-09 MED ORDER — DOPAMINE-DEXTROSE 3.2-5 MG/ML-% IV SOLN
2.0000 ug/kg/min | INTRAVENOUS | Status: DC
  Administered 2015-11-09: 2 ug/kg/min via INTRAVENOUS
  Filled 2015-11-09: qty 250

## 2015-11-09 MED ORDER — INSULIN ASPART 100 UNIT/ML ~~LOC~~ SOLN
2.0000 [IU] | SUBCUTANEOUS | Status: DC
  Administered 2015-11-09: 2 [IU] via SUBCUTANEOUS
  Filled 2015-11-09: qty 2

## 2015-11-09 MED ORDER — MAGNESIUM SULFATE 2 GM/50ML IV SOLN
2.0000 g | Freq: Once | INTRAVENOUS | Status: AC
  Administered 2015-11-09: 2 g via INTRAVENOUS
  Filled 2015-11-09: qty 50

## 2015-11-09 MED ORDER — THIAMINE HCL 100 MG/ML IJ SOLN
100.0000 mg | Freq: Every day | INTRAMUSCULAR | Status: DC
Start: 2015-11-09 — End: 2015-11-14
  Administered 2015-11-09 – 2015-11-14 (×6): 100 mg via INTRAVENOUS
  Filled 2015-11-09 (×6): qty 2

## 2015-11-09 MED ORDER — SODIUM CHLORIDE 0.9 % IV BOLUS (SEPSIS)
500.0000 mL | Freq: Once | INTRAVENOUS | Status: AC
  Administered 2015-11-09: 500 mL via INTRAVENOUS

## 2015-11-09 MED ORDER — LORAZEPAM 2 MG/ML IJ SOLN
1.0000 mg | INTRAMUSCULAR | Status: DC | PRN
  Administered 2015-11-10: 2 mg via INTRAVENOUS
  Filled 2015-11-09 (×3): qty 1

## 2015-11-09 MED ORDER — METOPROLOL TARTRATE 5 MG/5ML IV SOLN
5.0000 mg | INTRAVENOUS | Status: DC | PRN
  Administered 2015-11-11 – 2015-11-12 (×3): 5 mg via INTRAVENOUS
  Filled 2015-11-09 (×2): qty 5
  Filled 2015-11-09: qty 10

## 2015-11-09 MED ORDER — DEXMEDETOMIDINE HCL IN NACL 400 MCG/100ML IV SOLN
0.2000 ug/kg/h | INTRAVENOUS | Status: DC
  Administered 2015-11-09: 0.2 ug/kg/h via INTRAVENOUS
  Administered 2015-11-09: 1.4 ug/kg/h via INTRAVENOUS
  Administered 2015-11-09: 1.7 ug/kg/h via INTRAVENOUS
  Administered 2015-11-09: 1.4 ug/kg/h via INTRAVENOUS
  Administered 2015-11-10 (×3): 1.7 ug/kg/h via INTRAVENOUS
  Filled 2015-11-09 (×9): qty 100

## 2015-11-09 MED ORDER — SODIUM CHLORIDE 0.9 % IV BOLUS (SEPSIS)
1000.0000 mL | Freq: Once | INTRAVENOUS | Status: AC
  Administered 2015-11-09: 1000 mL via INTRAVENOUS

## 2015-11-09 MED ORDER — HEPARIN SODIUM (PORCINE) 5000 UNIT/ML IJ SOLN
5000.0000 [IU] | Freq: Three times a day (TID) | INTRAMUSCULAR | Status: DC
  Administered 2015-11-09 – 2015-11-10 (×3): 5000 [IU] via SUBCUTANEOUS
  Filled 2015-11-09 (×3): qty 1

## 2015-11-09 NOTE — Progress Notes (Signed)
Dr.Ram and Dr.Schnier notified of pts ongoing agitation and confusion. Pt family denies any drinking or narcotic use.  Orders for CT.

## 2015-11-09 NOTE — Progress Notes (Signed)
Dr.Schnier returned paged. New orders given for strict NPO, no ice chips or cough drops at this time. Also orders for KUB now and one in the AM.  Do not replace NG at this time. Will continue to assess.

## 2015-11-09 NOTE — Progress Notes (Signed)
Pt is in stable condition at this time. Pt is asleep with sitter and family at bedside. Full assessment in Chart. Report given to [facility], Therapist, sports.

## 2015-11-09 NOTE — Progress Notes (Signed)
Pt continues to be combative and verbally abusive. E link paged. Precedex rate modified. Will continue to assess.

## 2015-11-09 NOTE — Consult Note (Signed)
[facility] Consultation     ASSESSMENT/PLAN   64 yo male with history of nicotine and etoh abuse, now s/p aorto-bifem bypass. Complicated by DT.   PULMONARY A: Extubated, acute respiratory failure, currently on 4-5 L of oxygen. -Chest x-ray film from 11/09/15, reviewed, hyperinflation consistent with emphysema. -Wean down oxygen as tolerated. -Schedule duo nebs.  CARDIOVASCULAR A: Atrial fibrillation-currently rate controlled. P:  -Lopressor when necessary for rate control and hypertension. -Labetalol when necessary for hypertension.  RENAL A:  Acute kidney injury P:   Continue IV fluids.  GASTROINTESTINAL A:  History of duodenal ulcer. P:   -Advance diet. -PPI for GI prophylaxis is not available IV, continue H2 blocker.  HEMATOLOGIC A:  Reactive leukocytosis P:  -Continue to monitor.  INFECTIOUS A: --  Micro/culture results:  BCx2 -- UC -- Sputum--  Antibiotics:   ENDOCRINE A:  Will start glucose checks and sliding scale insulin.  NEUROLOGIC A: History of CVA P:      MAJOR EVENTS/TEST RESULTS:   Best Practices  DVT Prophylaxis: SCD.  GI Prophylaxis: initiated.    ---------------------------------------  ---------------------------------------   Name: Gary Mccoy. MRN: [id number] DOB: 03/04/1951    ADMISSION DATE:  11/08/2015 CONSULTATION DATE:  11/09/15  REFERRING MD :  Dr. Delana Meyer  CHIEF COMPLAINT:  Confusion.    HISTORY OF PRESENT ILLNESS:   The patient is a 65 year old male, admitted to the hospital on 11/08/15, the patient underwent a aortobifemoral bypass with left common femoral and right common femoral endarterectomies, as well as an aortic endarterectomy.  The patient is not able to provide history due to confusion. The procedure was well-tolerated. Patient was extubated after the procedure and brought to the intensive care unit for monitoring. The patient is a smoker of 2 packs per day.    Patient does has a history of drinking, though this is not quantified. Over the course of last 24 hours. It was noted that the patient's mental status and increasingly gone down, he appears increasingly confused, he suspected of having alcohol withdrawal syndrome.   PAST MEDICAL HISTORY :  Past Medical History:  Diagnosis Date  . Arthritis    lower back  . Back pain   . Duodenal ulcer   . Elevated lipids   . GERD (gastroesophageal reflux disease)   . Leg weakness, bilateral    s/p lumbar surgery  . PAD (peripheral artery disease) ([place])   . Stroke ([place])    Rt lower leg  estimated 10 - 12 stokes in last 5 years  . Wears dentures    full upper and lower   Past Surgical History:  Procedure Laterality Date  . BACK SURGERY  2005  . [place]  . ESOPHAGOGASTRODUODENOSCOPY (EGD) WITH PROPOFOL N/A 09/04/2015   Procedure: ESOPHAGOGASTRODUODENOSCOPY (EGD) WITH PROPOFOL;  Surgeon: Lucilla Lame, MD;  Location: [facility];  Service: Endoscopy;  Laterality: N/A;  . TEE WITHOUT CARDIOVERSION N/A 09/22/2015   Procedure: TRANSESOPHAGEAL ECHOCARDIOGRAM (TEE);  Surgeon: Teodoro Spray, MD;  Location: [facility];  Service: Cardiovascular;  Laterality: N/A;   Prior to Admission medications   Medication Sig Start Date End Date Taking? Authorizing Provider  aspirin 81 MG chewable tablet Chew 1 tablet (81 mg total) by mouth daily. 09/22/15  Yes Dustin Flock, MD  atorvastatin (LIPITOR) 40 MG tablet Take 1 tablet (40 mg total) by mouth daily at 6 PM. 09/22/15  Yes Dustin Flock, MD  celecoxib (CELEBREX) 200 MG capsule Take 200  mg by mouth daily. 09/12/15  Yes Historical Provider, MD  Multiple Vitamins-Minerals (MULTIVITAMIN ADULT PO) Take 1 tablet by mouth daily.    Yes Historical Provider, MD  rivaroxaban (XARELTO) 20 MG TABS tablet Take 1 tablet (20 mg total) by mouth daily. 09/22/15  Yes Shreyang Patel, MD  VENTOLIN HFA 108 (90 Base) MCG/ACT inhaler Take 1-2 puffs by mouth 3 (three)  times daily as needed. For congestion, shortness of breath, and/or wheezing. 07/03/15  Yes Historical Provider, MD   Allergies  Allergen Reactions  . Morphine And Related Itching    FAMILY HISTORY:  History reviewed. No pertinent family history. SOCIAL HISTORY:  reports that he has been smoking Cigarettes.  He has a 45.00 pack-year smoking history. He has never used smokeless tobacco. He reports that he drinks alcohol. He reports that he does not use drugs.  REVIEW OF SYSTEMS:   Can not provide due to confusion.    VITAL SIGNS: Temp:  [96.8 F (36 C)-98.6 F (37 C)] 97.8 F (36.6 C) (08/24 0800) Pulse Rate:  [66-112] 90 (08/24 0800) Resp:  [10-21] 15 (08/24 0800) BP: (101-143)/(54-84) 107/79 (08/24 0800) SpO2:  [90 %-100 %] 99 % (08/24 0800) Arterial Line BP: (72-131)/(49-103) 72/54 (08/24 0800) Weight:  [205 lb 4 oz (93.1 kg)-208 lb 15.9 oz (94.8 kg)] 208 lb 15.9 oz (94.8 kg) (08/24 0350) HEMODYNAMICS: CVP:  [5 mmHg-35 mmHg] 13 mmHg VENTILATOR SETTINGS:   INTAKE / OUTPUT:  Intake/Output Summary (Last 24 hours) at 11/09/15 0903 Last data filed at 11/09/15 0809  Gross per 24 hour  Intake             7575 ml  Output             1900 ml  Net             5675 ml    Physical Examination:   VS: BP 107/79 (BP Location: Left Arm)   Pulse 90   Temp 97.8 F (36.6 C) (Oral)   Resp 15   Ht _0  (1.854 m)   Wt 208 lb 15.9 oz (94.8 kg)   SpO2 99%   BMI 27.57 kg/m   General Appearance: No distress  Neuro:without focal findings, mental status confused,  HEENT: PERRLA, EOM intact, no ptosis,   Pulmonary: normal breath sounds., diaphragmatic excursion normal. CardiovascularNormal S1,S2.  No m/r/g.    Abdomen: Benign, Soft, non-tender, No masses, hepatosplenomegaly, No lymphadenopathy Renal:  No costovertebral tenderness  GU:  Not performed at this time. Endoc: No evident thyromegaly, no signs of acromegaly. Skin:   warm, no rashes, no ecchymosis  Extremities: normal, no  cyanosis, clubbing, no edema, warm with normal capillary refill.    LABS: Reviewed   LABORATORY PANEL:   CBC  Recent Labs Lab 11/09/15 0609  WBC 10.0  HGB 14.1  HCT 41.6  PLT 116*    Chemistries   Recent Labs Lab 11/09/15 0609  NA 139  K 4.9  CL 111  CO2 25  GLUCOSE 111*  BUN 18  CREATININE 1.19  CALCIUM 7.8*  MG 1.5*    No results for input(s): GLUCAP in the last 168 hours. No results for input(s): PHART, PCO2ART, PO2ART in the last 168 hours. No results for input(s): AST, ALT, ALKPHOS, BILITOT, ALBUMIN in the last 168 hours.  Cardiac Enzymes No results for input(s): TROPONINI in the last 168 hours.  RADIOLOGY:  Dg Chest Port 1 View  Result Date: 11/08/2015 CLINICAL DATA:  Status post central line insertion  EXAM: PORTABLE CHEST 1 VIEW COMPARISON:  10/24/2015 FINDINGS: Cardiac shadow is within normal limits. The lungs are well aerated bilaterally. No pneumothorax is seen. A right jugular central line and nasogastric catheter are seen. IMPRESSION: No pneumothorax following central line placement. Electronically Signed   By: Inez Catalina M.D.   On: 11/08/2015 13:49       --Marda Stalker, MD.  Board Certified in Internal Medicine, Pulmonary Medicine, [place], and Sleep Medicine.  ICU Pager [phone] [facility] Office Number: [phone]  Patricia Pesa, M.D.  Vilinda Boehringer, M.D.  Merton Border, M.D   11/09/2015, 9:03 AM  [place].  I have personally obtained a history, examined the patient, evaluated laboratory and imaging results, formulated the assessment and plan and placed orders. The Patient requires high complexity decision making for assessment and support, frequent evaluation and titration of therapies, application of advanced monitoring technologies and extensive interpretation of multiple databases. The patient has critical illness that could lead imminently to failure of 1 or more  organ systems and requires the highest level of physician preparedness to intervene.  Critical Care Time devoted to patient care services described in this note is 35 minutes and is exclusive of time spent in procedures.

## 2015-11-09 NOTE — Progress Notes (Signed)
[facility]  Daily Progress Note   1 Day Post-Op: Aortobifemoral bypass grafting with 16 x 8 bifurcated dacryon graft, Right common femoral endarterectomy, Right superficial femoral endarterectomy, Right profunda femoris endarterectomy, Left common femoral endarterectomy, Left profunda femoris endarterectomy, Left superficial femoral endarterectomy with Aortic endarterectomy  Patient without complaint this AM. Had pain issues last night. IV tylenol and Toradol added to control pain. Patient not taking deep breaths we discussed how this could lead to fevers and pneumonia. Encouraged deep breathing and coughing. No flatus or bowel movement.   Objective: Vitals:   11/09/15 0500 11/09/15 0605 11/09/15 0700 11/09/15 0800  BP: 112/67 124/73 (!) 105/54 107/79  Pulse: 96 94 99 90  Resp: _0 Temp:    97.8 F (36.6 C)  TempSrc:    Oral  SpO2: 99% 95% 96% 99%  Weight:      Height:        Intake/Output Summary (Last 24 hours) at 11/09/15 0856 Last data filed at 11/09/15 0809  Gross per 24 hour  Intake             7575 ml  Output             1900 ml  Net             5675 ml    Physical Exam: A&Ox3, NAD Neck: Right IJ - with out swelling or drainage - no signs of infection CV: RRR Pulmonary: CTA Bilaterally Abdomen: OR dressings in place - clean and dry. Mild distention. Tender to palpation. NG in place and sumping Right groin: OR dressing in place - clean and dry. No signs of swelling.  Left groin: OR dressing in place - clean and dry. No signs of swelling. Urologic: foley in place, draining clear urine Vascular: Bilateral lower extremity warm, with minimal edema. Left DP 2+/. Right PT 1+ pulses   Laboratory: CBC    Component Value Date/Time   WBC 10.0 11/09/2015 0609   HGB 14.1 11/09/2015 0609   HCT 41.6 11/09/2015 0609   PLT 116 (L) 11/09/2015 0609   BMET    Component Value Date/Time   NA 139 11/09/2015 0609   K 4.9 11/09/2015 0609   CL 111  11/09/2015 0609   CO2 25 11/09/2015 0609   GLUCOSE 111 (H) 11/09/2015 0609   BUN 18 11/09/2015 0609   CREATININE 1.19 11/09/2015 0609   CALCIUM 7.8 (L) 11/09/2015 0609   GFRNONAA >60 11/09/2015 0609   GFRAA >60 11/09/2015 5732   Assessment/Planning: 65 year old male s/p aorto b-femoral bypass - stable 1) Continue NG as bowel function has not returned yet. 2) Pain control 3) Encouraged deep breathing and coughing 4) OOB to chair  [employer] PA-C 11/09/2015 8:56 AM

## 2015-11-09 NOTE — Progress Notes (Signed)
[facility] Progress Note Patient Name: Gary Mccoy. DOB: Aug 05, 1950 MRN: [id number]   Date of Service  11/09/2015  HPI/Events of Note  Agitation - Already on a Precedex IV infusion and trying to get out of bed.   eICU Interventions  Will order: 1. Increase ceiling on Precedex IV infusion to 1.7 mcg/kg/hour. 2. Monitor End Tidal CO2.      Intervention Category Minor Interventions: Agitation / anxiety - evaluation and management  Jusitn Salsgiver Eugene 11/09/2015, 3:22 PM

## 2015-11-09 NOTE — Progress Notes (Signed)
PT Cancellation Note  Patient Details Name: Gary Mccoy. MRN: [id number] DOB: [birth year]   Cancelled Treatment:    Reason Eval/Treat Not Completed: Patient's level of consciousness Pt has apparently been agitated and combative. Spoke with nurses who states "Please do not try to see him today."  Will check back tomorrow to see if he is appropriate.  Kreg Shropshire, DPT 11/09/2015, 3:40 PM

## 2015-11-09 NOTE — Progress Notes (Signed)
Pt was observed yelling. Upon entering room, pt had started trying to get out of bed. Pt was pulling on central line, NG had already been removed. Nurse reoriented pt. Bed alarm on, bed in lowest position. Pt safety mitts on.  Dr.Schnier paged. Will continue to assess.

## 2015-11-09 NOTE — Progress Notes (Signed)
Dr.Schnier paged to inform of poor urine output during my shift today and to ask about a repeat magnesium level.  Spoke with [employer].  Orders given to bolus 1072m, and increase rate of Maintenance fluids to 150 mL.

## 2015-11-09 NOTE — Progress Notes (Signed)
Unable to tolerate chest pt today,very agitated

## 2015-11-10 ENCOUNTER — Encounter: Payer: Self-pay | Admitting: Vascular Surgery

## 2015-11-10 ENCOUNTER — Inpatient Hospital Stay: Payer: Managed Care, Other (non HMO)

## 2015-11-10 DIAGNOSIS — R410 Disorientation, unspecified: Secondary | ICD-10-CM

## 2015-11-10 DIAGNOSIS — G9340 Encephalopathy, unspecified: Secondary | ICD-10-CM

## 2015-11-10 DIAGNOSIS — I4581 Long QT syndrome: Secondary | ICD-10-CM

## 2015-11-10 LAB — CBC
HEMATOCRIT: 39.5 % — AB (ref 40.0–52.0)
HEMOGLOBIN: 13.5 g/dL (ref 13.0–18.0)
MCH: 31 pg (ref 26.0–34.0)
MCHC: 34.2 g/dL (ref 32.0–36.0)
MCV: 90.6 fL (ref 80.0–100.0)
Platelets: 92 10*3/uL — ABNORMAL LOW (ref 150–440)
RBC: 4.36 MIL/uL — ABNORMAL LOW (ref 4.40–5.90)
RDW: 15.3 % — AB (ref 11.5–14.5)
WBC: 12 10*3/uL — AB (ref 3.8–10.6)

## 2015-11-10 LAB — TYPE AND SCREEN
ABO/RH(D): O POS
Antibody Screen: NEGATIVE
UNIT DIVISION: 0
Unit division: 0
Unit division: 0
Unit division: 0

## 2015-11-10 LAB — GLUCOSE, CAPILLARY
GLUCOSE-CAPILLARY: 100 mg/dL — AB (ref 65–99)
GLUCOSE-CAPILLARY: 108 mg/dL — AB (ref 65–99)
GLUCOSE-CAPILLARY: 117 mg/dL — AB (ref 65–99)
GLUCOSE-CAPILLARY: 70 mg/dL (ref 65–99)
GLUCOSE-CAPILLARY: 93 mg/dL (ref 65–99)
Glucose-Capillary: 128 mg/dL — ABNORMAL HIGH (ref 65–99)
Glucose-Capillary: 113 mg/dL — ABNORMAL HIGH (ref 65–99)

## 2015-11-10 LAB — BASIC METABOLIC PANEL
ANION GAP: 4 — AB (ref 5–15)
BUN: 21 mg/dL — ABNORMAL HIGH (ref 6–20)
CALCIUM: 7.9 mg/dL — AB (ref 8.9–10.3)
CHLORIDE: 115 mmol/L — AB (ref 101–111)
CO2: 21 mmol/L — AB (ref 22–32)
Creatinine, Ser: 0.94 mg/dL (ref 0.61–1.24)
GFR calc non Af Amer: >60 mL/min (ref >60)
GLUCOSE: 112 mg/dL — AB (ref 65–99)
Potassium: 4.9 mmol/L (ref 3.5–5.1)
Sodium: 140 mmol/L (ref 135–145)

## 2015-11-10 LAB — PREPARE RBC (CROSSMATCH)

## 2015-11-10 LAB — PHOSPHORUS: PHOSPHORUS: 2.4 mg/dL — AB (ref 2.5–4.6)

## 2015-11-10 LAB — MAGNESIUM: Magnesium: 1.9 mg/dL (ref 1.7–2.4)

## 2015-11-10 MED ORDER — HALOPERIDOL LACTATE 5 MG/ML IJ SOLN
5.0000 mg | Freq: Once | INTRAMUSCULAR | Status: AC
  Administered 2015-11-10: 5 mg via INTRAVENOUS

## 2015-11-10 MED ORDER — DEXMEDETOMIDINE HCL IN NACL 400 MCG/100ML IV SOLN
0.2000 ug/kg/h | INTRAVENOUS | Status: DC
  Administered 2015-11-10: 0.6 ug/kg/h via INTRAVENOUS
  Administered 2015-11-10: 2.1 ug/kg/h via INTRAVENOUS
  Administered 2015-11-10: 0.2 ug/kg/h via INTRAVENOUS
  Administered 2015-11-10: 2.1 ug/kg/h via INTRAVENOUS
  Administered 2015-11-10: 1.7 ug/kg/h via INTRAVENOUS
  Administered 2015-11-10: 1.9 ug/kg/h via INTRAVENOUS
  Filled 2015-11-10 (×6): qty 100

## 2015-11-10 MED ORDER — DEXTROSE-NACL 5-0.9 % IV SOLN: INTRAVENOUS | Status: DC

## 2015-11-10 MED ORDER — HALOPERIDOL LACTATE 5 MG/ML IJ SOLN
INTRAMUSCULAR | Status: AC
  Filled 2015-11-10: qty 1

## 2015-11-10 MED ORDER — DEXTROSE-NACL 5-0.45 % IV SOLN
INTRAVENOUS | Status: DC
  Administered 2015-11-10 – 2015-11-11 (×3): via INTRAVENOUS

## 2015-11-10 MED ORDER — LORAZEPAM 2 MG/ML IJ SOLN: 1.0000 mg | INTRAMUSCULAR | Status: DC | PRN

## 2015-11-10 MED ORDER — RISPERIDONE 1 MG PO TABS
0.5000 mg | ORAL_TABLET | Freq: Every day | ORAL | Status: DC
  Administered 2015-11-10 – 2015-11-14 (×5): 0.5 mg via ORAL
  Filled 2015-11-10 (×5): qty 1

## 2015-11-10 MED ORDER — ENOXAPARIN SODIUM 40 MG/0.4ML ~~LOC~~ SOLN
40.0000 mg | SUBCUTANEOUS | Status: DC
  Administered 2015-11-10 – 2015-11-12 (×3): 40 mg via SUBCUTANEOUS
  Filled 2015-11-10 (×3): qty 0.4

## 2015-11-10 MED ORDER — NICOTINE 21 MG/24HR TD PT24
21.0000 mg | MEDICATED_PATCH | Freq: Every day | TRANSDERMAL | Status: DC
  Administered 2015-11-10 – 2015-11-15 (×6): 21 mg via TRANSDERMAL
  Filled 2015-11-10 (×6): qty 1

## 2015-11-10 NOTE — Progress Notes (Addendum)
Dr. Carroll Kinds mentioned if there is a need for a neurology consult at this time for patient due to his agitation- I referred this information to Dr. Juanell Fairly- he (Dr. Juanell Fairly) would like to hold off at this time for the consult until patient is able to wean off precedex drip.  Also no orders given for CIWA protocol- Dr. Juanell Fairly stated per patient's wife- patient does not drink alcohol.

## 2015-11-10 NOTE — Progress Notes (Signed)
[facility] Progress Note Patient Name: Gary Mccoy. DOB: 1950/03/23 MRN: [id number]   Date of Service  11/10/2015  HPI/Events of Note  Severe agitation despite precedex infusion.  Nurse reports that benzos worsened agitation.   eICU Interventions  Haldol 5 mg IV now.  Nurse to call [facility] MD in 30 minutes if patient remains aagitated. 12 lead EKG to check QTc Increase in precedex dosing.     Intervention Category Major Interventions: Delirium, psychosis, severe agitation - evaluation and management  Annick Dimaio 11/10/2015, 6:09 AM

## 2015-11-10 NOTE — Progress Notes (Signed)
PT Cancellation Note  Patient Details Name: Gary Mccoy. MRN: [id number] DOB: November 08, 1950   Cancelled Treatment:    Reason Eval/Treat Not Completed: Other (comment) (highly agitated) Spoke with nursing again today who reports that pt continues to be highly agitated and states "You don't need to be going into that room right now."  Kreg Shropshire, DPT 11/10/2015, 11:46 AM

## 2015-11-10 NOTE — Progress Notes (Signed)
Pt became very agitated, yelling, hitting at nurses.  Pt pulled off oxygen with safety mitts on.  Sitter attempted to calm pt. Down but was unsuccessful.  Precedex had to be increased to 1.58mg/kg/hr.

## 2015-11-10 NOTE — Progress Notes (Signed)
[facility]  Daily Progress Note   Subjective: 2 Days Post-Op: Aortobifemoral bypass grafting with 16 x 8bifurcated dacryon graft, Right common femoral endarterectomy, Right superficial femoral endarterectomy, Right profunda femoris endarterectomy, Left common femoral endarterectomy, Left profunda femoris endarterectomy, Left superficial femoral endarterectomy with Aortic endarterectomy.  Patient not as agitated this AM however is on Precedex. Patient pulled out NG tube and A-Line yesterday during an agitated episode. Started on Ativan protocol for DT - family states no narcotic or ETOH abuse. Increase in urine output after initiation of dopamine which was then stopped a few hours later for hypertension. Bolused about 3500cc's yesterday. H&H stable. Kidney function stable.   Objective: Vitals:   11/10/15 0541 11/10/15 0600 11/10/15 0700 11/10/15 0843  BP: (!) 185/95 (!) 175/79 124/60   Pulse:  88    Resp:  (!) 33 19 19  Temp:    97.8 F (36.6 C)  TempSrc:    Axillary  SpO2:  100%    Weight:      Height:        Intake/Output Summary (Last 24 hours) at 11/10/15 1023 Last data filed at 11/10/15 0700  Gross per 24 hour  Intake          3672.86 ml  Output              975 ml  Net          2697.86 ml   Physical Exam: Sedated, NAD Neck: Right IJ - with out swelling or drainage - no signs of infection CV: RRR Pulmonary: CTA Bilaterally Abdomen: OR dressings in place - clean and dry. Mild distention. Soft. Appropriate postop exam. Right groin: OR dressing in place - clean and dry. No signs of swelling.  Left groin: OR dressing in place - clean and dry. No signs of swelling. Urologic: foley in place, draining clear urine Vascular: Bilateral lower extremity warm, with minimal edema. Palpable pedal pulses.   Laboratory: CBC    Component Value Date/Time   WBC 12.0 (H) 11/10/2015 0448   HGB 13.5 11/10/2015 0448   HCT 39.5 (L) 11/10/2015 0448   PLT 92 (L)  11/10/2015 0448   BMET    Component Value Date/Time   NA 140 11/10/2015 0448   K 4.9 11/10/2015 0448   CL 115 (H) 11/10/2015 0448   CO2 21 (L) 11/10/2015 0448   GLUCOSE 112 (H) 11/10/2015 0448   BUN 21 (H) 11/10/2015 0448   CREATININE 0.94 11/10/2015 0448   CALCIUM 7.9 (L) 11/10/2015 0448   GFRNONAA >60 11/10/2015 0448   GFRAA >60 11/10/2015 0448   Assessment/Planning: 65 year old male s/p aorto bi-femoral bypass - stable 1) KUB without large gastric bubble, patient not vomiting - hold of on re-insertion of NG tube. Would reinsert if physical exam changes, patient becomes nauseous or vomits.  2) Continue resuscitation - watch urine output. Foley must remain past POD #2 as monitoring patients I&O's is critical at this point in his post-operative course. 3) Critical care management as per ICU team.   Marcelle Overlie PA-C 11/10/2015 10:23 AM

## 2015-11-10 NOTE — Progress Notes (Signed)
Pt failed swallow eval, will defer Speech eval request until pt is off precedex

## 2015-11-10 NOTE — Consult Note (Addendum)
[facility] Consultation     ASSESSMENT/PLAN   65 yo male with history of nicotine and etoh abuse, now s/p aorto-bifem bypass. Complicated by DT vs sundowning.   NEUROLOGIC A: History of CVA, course, now complicated by severe agitation and delirium/acute encephalopathy, possible sundowning. Per wife, the patient has no history of alcohol abuse. P:   -Continue Ativan when necessary. -Start Risperdal daily at bedtime.  PULMONARY A: Extubated, acute respiratory failure, currently on 4-5 L of oxygen. -emphysema. -Wean down oxygen as tolerated. -continue Scheduled duo nebs.  CARDIOVASCULAR A: Atrial fibrillation-currently rate controlled. P:  -Lopressor when necessary for rate control and hypertension. -Labetalol when necessary for hypertension.  RENAL A:  Acute kidney injury P:   Continue IV fluids.  GASTROINTESTINAL A:  History of duodenal ulcer. P:   -Advance diet. -PPI for GI prophylaxis is not available IV, continue H2 blocker.  HEMATOLOGIC A:  Reactive leukocytosis P:  -Continue to monitor.  INFECTIOUS A: --  Micro/culture results:  BCx2 -- UC -- Sputum--  Antibiotics:   ENDOCRINE A:  Continue glucose checks and sliding scale insulin.   MAJOR EVENTS/TEST RESULTS: S/p aortobifemoral bypass on 8/23. Severe delirium/agitation. On evening of 8/24  Best Practices  DVT Prophylaxis: SCD.  GI Prophylaxis: Famotidine   ---------------------------------------  ---------------------------------------   Name: Gary Mccoy. MRN: [id number] DOB: Nov 06, 1950    ADMISSION DATE:  11/08/2015 CONSULTATION DATE:  11/09/15  REFERRING MD :  Dr. Delana Meyer  CHIEF COMPLAINT:  Confusion.    Interim history: Patient is lethargic today, overnight the patient had significant problems with delirium and agitation. He was given Haldol with little effect, his Precedex was increased. He ultimately seemed to respond to Ativan  IV.   REVIEW OF SYSTEMS:   Can not provide due to confusion.    VITAL SIGNS: Temp:  [97.1 F (36.2 C)-98.7 F (37.1 C)] 97.1 F (36.2 C) (08/25 0430) Pulse Rate:  [59-97] 88 (08/25 0600) Resp:  [16-33] 19 (08/25 0700) BP: (97-192)/(56-143) 124/60 (08/25 0700) SpO2:  [96 %-100 %] 100 % (08/25 0600) Arterial Line BP: (65-89)/(52-78) 89/78 (08/24 1100) HEMODYNAMICS: CVP:  [0 mmHg-18 mmHg] 0 mmHg VENTILATOR SETTINGS:   INTAKE / OUTPUT:  Intake/Output Summary (Last 24 hours) at 11/10/15 0835 Last data filed at 11/10/15 0405  Gross per 24 hour  Intake          3473.06 ml  Output              750 ml  Net          2723.06 ml    Physical Examination:   VS: BP 124/60   Pulse 88   Temp 97.1 F (36.2 C)   Resp 19   Ht _0  (1.854 m)   Wt 208 lb 15.9 oz (94.8 kg)   SpO2 100%   BMI 27.57 kg/m   General Appearance: No distress  Neuro:without focal findings, mental status confused,  HEENT: PERRLA, EOM intact, no ptosis,   Pulmonary: normal breath sounds., diaphragmatic excursion normal. CardiovascularNormal S1,S2.  No m/r/g.    Abdomen: Benign, Soft, non-tender, No masses, hepatosplenomegaly, No lymphadenopathy Renal:  No costovertebral tenderness  GU:  Not performed at this time. Endoc: No evident thyromegaly, no signs of acromegaly. Skin:   warm, no rashes, no ecchymosis  Extremities: normal, no cyanosis, clubbing, no edema, warm with normal capillary refill.    LABS: Reviewed   LABORATORY PANEL:   CBC  Recent Labs Lab 11/10/15 0448  WBC  12.0*  HGB 13.5  HCT 39.5*  PLT 92*    Chemistries   Recent Labs Lab 11/10/15 0448  NA 140  K 4.9  CL 115*  CO2 21*  GLUCOSE 112*  BUN 21*  CREATININE 0.94  CALCIUM 7.9*  MG 1.9  PHOS 2.4*     Recent Labs Lab 11/09/15 1231 11/09/15 1633 11/09/15 1951 11/09/15 2345 11/10/15 0403 11/10/15 0733  GLUCAP 92 123* 102* 107* 100* 113*   No results for input(s): PHART, PCO2ART, PO2ART in the last 168  hours. No results for input(s): AST, ALT, ALKPHOS, BILITOT, ALBUMIN in the last 168 hours.  Cardiac Enzymes No results for input(s): TROPONINI in the last 168 hours.  RADIOLOGY:  Dg Abd 1 View  Result Date: 11/09/2015 CLINICAL DATA:  Status post aortic bypass surgery. EXAM: ABDOMEN - 1 VIEW COMPARISON:  CT of the abdomen pelvis 09/19/2015 FINDINGS: The bowel gas pattern is normal. No radio-opaque calculi or other significant radiographic abnormality are seen. Skin staples noted. No evidence of free gas on this supine radiograph. IMPRESSION: Nonobstructive bowel gas pattern. Electronically Signed   By: Fidela Salisbury M.D.   On: 11/09/2015 10:12   Ct Head Wo Contrast  Result Date: 11/09/2015 CLINICAL DATA:  Change in mental status EXAM: CT HEAD WITHOUT CONTRAST TECHNIQUE: Contiguous axial images were obtained from the base of the skull through the vertex without intravenous contrast. COMPARISON:  Brain MRI 09/20/2015 FINDINGS: Brain: No intracranial hemorrhage, mass effect or midline shift. Stable cerebral atrophy. Stable chronic white matter disease. Again noted old infarct in left PCA territory. No definite acute cortical infarction. No mass lesion is noted on this unenhanced scan. Vascular: Atherosclerotic calcifications of carotid siphon. Skull: No skull fracture. Sinuses/Orbits: Unremarkable Other: None IMPRESSION: No acute intracranial abnormality. 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 No definite acute cortical infarction. Stable atrophy and chronic white matter disease. Electronically Signed   By: Lahoma Crocker M.D.   On: 11/09/2015 12:29   Dg Chest Port 1 View  Result Date: 11/08/2015 CLINICAL DATA:  Status post central line insertion EXAM: PORTABLE CHEST 1 VIEW COMPARISON:  10/24/2015 FINDINGS: Cardiac shadow is within normal limits. The lungs are well aerated bilaterally. No pneumothorax is seen. A right jugular central line and nasogastric catheter are seen. IMPRESSION: No  pneumothorax following central line placement. Electronically Signed   By: Inez Catalina M.D.   On: 11/08/2015 13:49       --Marda Stalker, MD.  Board Certified in Internal Medicine, Pulmonary Medicine, [place], and Sleep Medicine.  ICU Pager [phone] [facility] Office Number: [phone]  Patricia Pesa, M.D.  Vilinda Boehringer, M.D.  Merton Border, M.D   11/10/2015, 8:35 AM  [facility].  I have personally obtained a history, examined the patient, evaluated laboratory and imaging results, formulated the assessment and plan and placed orders. The Patient requires high complexity decision making for assessment and support, frequent evaluation and titration of therapies, application of advanced monitoring technologies and extensive interpretation of multiple databases. The patient has critical illness that could lead imminently to failure of 1 or more organ systems and requires the highest level of physician preparedness to intervene.  Critical Care Time devoted to patient care services described in this note is 35 minutes and is exclusive of time spent in procedures.

## 2015-11-10 NOTE — Progress Notes (Signed)
Paged K. Stegmayer, PA concerning pt's blood pressure being elevated with the dopamine infusing. BP at 685 systolic, prn hydralazine was given as ordered.  BP systolic went down to 992.  Per PA, pause the dopamine drip and monitor urine output.  At this time urine output for the shift has been 225 ml.  If urine output decreases to less than 35 ml/hr, turn dopamine drip back on to 7mg/kg/min.  As long as urine output is greater than 339mhr, leave dopamine off.

## 2015-11-10 NOTE — Progress Notes (Signed)
Code 300 was called on pt. this morning.  Pt was extremely agitated, aggressive.  Dr. Jimmy Footman ordered precedex drip to run at 2.1 mcg/kg/hr.  Haldol was also ordered and given.  Dr. Jimmy Footman ordered an EKG to be completed but pt. was so agitated that he did not allow EKG to be done.  Dr. Marcille Blanco also assessed pt. and placed him on CIWA scale.  Dr. Marcille Blanco ordered ativan to be given.  Pt now sleeping soundly.  Wife at bedside.

## 2015-11-10 NOTE — Progress Notes (Signed)
Pt is extremely agitated an will not allow the EKG to be performed. RN and sitter at bedside trying to calm pt.

## 2015-11-11 ENCOUNTER — Inpatient Hospital Stay: Payer: Managed Care, Other (non HMO)

## 2015-11-11 LAB — HEPARIN INDUCED PLATELET AB (HIT ANTIBODY): HEPARIN INDUCED PLT AB: 0.179 {OD_unit} (ref 0.000–0.400)

## 2015-11-11 LAB — MAGNESIUM: Magnesium: 1.9 mg/dL (ref 1.7–2.4)

## 2015-11-11 LAB — GLUCOSE, CAPILLARY
GLUCOSE-CAPILLARY: 81 mg/dL (ref 65–99)
GLUCOSE-CAPILLARY: 88 mg/dL (ref 65–99)
GLUCOSE-CAPILLARY: 83 mg/dL (ref 65–99)
Glucose-Capillary: 93 mg/dL (ref 65–99)
Glucose-Capillary: 92 mg/dL (ref 65–99)

## 2015-11-11 LAB — CBC
HEMATOCRIT: 36.3 % — AB (ref 40.0–52.0)
Hemoglobin: 12.8 g/dL — ABNORMAL LOW (ref 13.0–18.0)
MCH: 31.7 pg (ref 26.0–34.0)
MCHC: 35.3 g/dL (ref 32.0–36.0)
MCV: 89.9 fL (ref 80.0–100.0)
PLATELETS: 87 10*3/uL — AB (ref 150–440)
RBC: 4.04 MIL/uL — AB (ref 4.40–5.90)
RDW: 15.1 % — AB (ref 11.5–14.5)
WBC: 10.1 10*3/uL (ref 3.8–10.6)

## 2015-11-11 LAB — BASIC METABOLIC PANEL
ANION GAP: 6 (ref 5–15)
BUN: 17 mg/dL (ref 6–20)
CALCIUM: 7.8 mg/dL — AB (ref 8.9–10.3)
CO2: 20 mmol/L — AB (ref 22–32)
Chloride: 112 mmol/L — ABNORMAL HIGH (ref 101–111)
Creatinine, Ser: 0.86 mg/dL (ref 0.61–1.24)
GLUCOSE: 102 mg/dL — AB (ref 65–99)
POTASSIUM: 3.5 mmol/L (ref 3.5–5.1)
Sodium: 138 mmol/L (ref 135–145)

## 2015-11-11 LAB — PHOSPHORUS: PHOSPHORUS: 2.2 mg/dL — AB (ref 2.5–4.6)

## 2015-11-11 MED ORDER — LISINOPRIL 5 MG PO TABS
5.0000 mg | ORAL_TABLET | Freq: Every day | ORAL | Status: DC
  Administered 2015-11-11 – 2015-11-14 (×4): 5 mg via ORAL
  Filled 2015-11-11 (×6): qty 1

## 2015-11-11 MED ORDER — PANTOPRAZOLE SODIUM 40 MG PO TBEC
40.0000 mg | DELAYED_RELEASE_TABLET | Freq: Every day | ORAL | Status: DC
  Administered 2015-11-11 – 2015-11-15 (×5): 40 mg via ORAL
  Filled 2015-11-11 (×5): qty 1

## 2015-11-11 NOTE — Progress Notes (Signed)
Alert and oriented. Denies pain except with cough. He has been up to chair much of day. Wife at bedside  Works on flutter valve independently.  Wheezes bilateral lung fields clear some with good coughing efforts. Metoprolol 75m given after spoke with Dr ELorenso Courierabout pt monitoring ST 110-120 min. No fevers. Poor appetite. Passing flatus.  Seen by PT today. 1-2 [facility]. Taking clear liquids without problems. Incisions to bilateral groins and midline abdomen approximated and without drainage.

## 2015-11-11 NOTE — Progress Notes (Signed)
3 Days Post-Op  Subjective: No events overnight. Remains on Precedex. He has been less agitated. Patient notes mild incisional pain, denies nausea. Minimal flatulence.  Objective: Vital signs in last 24 hours: Temp:  [97.2 F (36.2 C)-99 F (37.2 C)] 98.4 F (36.9 C) (08/26 0756) Pulse Rate:  [45-108] 93 (08/26 0700) Resp:  [19-35] 28 (08/26 0700) BP: (125-176)/(65-112) 161/74 (08/26 0700) SpO2:  [78 %-100 %] 100 % (08/26 0700) Last BM Date: 11/13/15  Intake/Output from previous day: 08/25 0701 - 08/26 0700 In: 4358.6 [I.V.:3208.6; IV Piggyback:50] Out: 825 [Urine:825] Intake/Output this shift: Total I/O In: 633.6 [I.V.:633.6] Out: 400 [Urine:400]  Alert, NAD, appropriate Neck: Right IJ - with out swelling or drainage - no signs of infection CV: RRR Pulmonary: CTA Bilaterally Abdomen: dressings clean,dry and intact. Mild distention. Soft. Mild tenderness to palpation. Groin Incisions: C/D/I, soft Urologic: foley in place, draining clear urine Vascular: Bilateral lower extremity warm, with minimal edema. Palp Left DP, Right PT  Lab Results:   Recent Labs  11/10/15 0448 11/11/15 0442  WBC 12.0* 10.1  HGB 13.5 12.8*  HCT 39.5* 36.3*  PLT 92* 87*   BMET  Recent Labs  11/10/15 0448 11/11/15 0442  NA 140 138  K 4.9 3.5  CL 115* 112*  CO2 21* 20*  GLUCOSE 112* 102*  BUN 21* 17  CREATININE 0.94 0.86  CALCIUM 7.9* 7.8*   PT/INR  Recent Labs  11/08/15 1400  LABPROT 15.4*  INR 1.21     Anti-infectives: Anti-infectives    Start     Dose/Rate Route Frequency Ordered Stop   11/08/15 1700  ceFAZolin (ANCEF) IVPB 1 g/50 mL premix     1 g 100 mL/hr over 30 Minutes Intravenous Every 8 hours 11/08/15 1258 11/09/15 0625   11/08/15 0637  ceFAZolin (ANCEF) 2-4 GM/100ML-% IVPB    Comments:  Rexanne Mano: cabinet override      11/08/15 2010 11/08/15 1844   11/08/15 0250  ceFAZolin (ANCEF) IVPB 2g/100 mL premix     2 g 200 mL/hr over 30 Minutes  Intravenous On call to O.R. 11/08/15 0250 11/08/15 0903      Assessment/Plan: s/p Procedure(s) with comments: AORTA BIFEMORAL BYPASS GRAFT (N/A) ENDARTERECTOMY FEMORAL (Bilateral) - common, SFA, and profundis  Aortic endarterectomy   Wean Precedex off. OOB to Chair Keep Foley for now, may require diuresis. Keep NPO Possible transfer to Tele later today.   LOS: 3 days    Gary Mccoy 11/11/2015

## 2015-11-11 NOTE — Progress Notes (Signed)
Report given to nurse for room # 251 on 2A.  Preparing for transfer.

## 2015-11-11 NOTE — Progress Notes (Signed)
[facility] Consultation     ASSESSMENT/PLAN   65 yo male with history of nicotine and etoh abuse, now s/p aorto-bifem bypass. Complicated by DT vs sundowning.   NEUROLOGIC A: History of CVA, course, now complicated by severe agitation and delirium/acute encephalopathy, possible sundowning. Per wife, the patient has no history of alcohol abuse. P:   -Continue Ativan when necessary. -Continue Risperdal daily at bedtime.  PULMONARY A: Extubated, acute respiratory failure, currently on 4-5 L of oxygen. -emphysema. -Wean down oxygen as tolerated. -continue Scheduled duo nebs.  CARDIOVASCULAR A: Atrial fibrillation-currently rate controlled. P:  -Lopressor when necessary for rate control and hypertension. -Labetalol when necessary for hypertension.  RENAL A:  Acute kidney injury-- improved P:   Continue IV fluids.  GASTROINTESTINAL A:  History of duodenal ulcer. P:   -Advance diet. -Change H2 blocker to PO PPI.  HEMATOLOGIC A:  Reactive leukocytosis--better.  P:  -Continue to monitor.  INFECTIOUS A: --  Micro/culture results:  BCx2 -- UC -- Sputum--  Antibiotics:   ENDOCRINE A:  Continue glucose checks and sliding scale insulin. Currently well controlled.    MAJOR EVENTS/TEST RESULTS: S/p aortobifemoral bypass on 8/23. Severe delirium/agitation. On evening of 8/24  Best Practices  DVT Prophylaxis: SCD.  GI Prophylaxis: Famotidine   ---------------------------------------  ---------------------------------------   Name: Gary Mccoy. MRN: [id number] DOB: January 24, 1951    ADMISSION DATE:  11/08/2015 CONSULTATION DATE:  11/09/15  REFERRING MD :  Dr. Delana Meyer  CHIEF COMPLAINT:  Confusion.    Interim history: Patient is lethargic today, overnight the patient had significant problems with delirium and agitation. Pt is much more calm today, he is very apologetic about his behavior and remembers much of it.     REVIEW OF SYSTEMS:   Pt Denies chest pain, PND, orthopnea. The remainder of the review of systems was reviewed with this patient and was found to be negative.   VITAL SIGNS: Temp:  [97.2 F (36.2 C)-99 F (37.2 C)] 98.4 F (36.9 C) (08/26 0756) Pulse Rate:  [45-108] 93 (08/26 0700) Resp:  [19-35] 28 (08/26 0700) BP: (125-176)/(65-112) 161/74 (08/26 0700) SpO2:  [78 %-100 %] 100 % (08/26 0700) HEMODYNAMICS:   VENTILATOR SETTINGS:   INTAKE / OUTPUT:  Intake/Output Summary (Last 24 hours) at 11/11/15 0904 Last data filed at 11/11/15 0800  Gross per 24 hour  Intake          4992.19 ml  Output             1225 ml  Net          3767.19 ml    Physical Examination:   VS: BP (!) 161/74   Pulse 93   Temp 98.4 F (36.9 C) (Oral)   Resp (!) 28   Ht _0  (1.854 m)   Wt 208 lb 15.9 oz (94.8 kg)   SpO2 100%   BMI 27.57 kg/m   General Appearance: No distress  Neuro:without focal findings, mental status Improved  HEENT: PERRLA, EOM intact, no ptosis,   Pulmonary: normal breath sounds., diaphragmatic excursion normal. CardiovascularNormal S1,S2.  No m/r/g.    Abdomen: Benign, Soft, non-tender, No masses. Renal:  No costovertebral tenderness  GU:  Not performed at this time. Endoc: No evident thyromegaly, no signs of acromegaly. Skin:   warm, no rashes, no ecchymosis  Extremities: normal, no cyanosis, clubbing, no edema, warm with normal capillary refill.    LABS: Reviewed   LABORATORY PANEL:   CBC  Recent Labs  Lab 11/11/15 0442  WBC 10.1  HGB 12.8*  HCT 36.3*  PLT 87*    Chemistries   Recent Labs Lab 11/11/15 0442  NA 138  K 3.5  CL 112*  CO2 20*  GLUCOSE 102*  BUN 17  CREATININE 0.86  CALCIUM 7.8*  MG 1.9  PHOS 2.2*     Recent Labs Lab 11/10/15 1109 11/10/15 1611 11/10/15 1936 11/10/15 2334 11/11/15 0338 11/11/15 0712  GLUCAP 117* 108* 93 70 81 88   No results for input(s): PHART, PCO2ART, PO2ART in the last 168 hours. No results  for input(s): AST, ALT, ALKPHOS, BILITOT, ALBUMIN in the last 168 hours.  Cardiac Enzymes No results for input(s): TROPONINI in the last 168 hours.  RADIOLOGY:  Dg Chest 1 View  Result Date: 11/11/2015 CLINICAL DATA:  Shortness of breath. EXAM: CHEST 1 VIEW COMPARISON:  11/08/2015 FINDINGS: Interval removal of the NG tube. Right central line remains in place, unchanged. Right base atelectasis. Left lung is clear. Heart is normal size. No effusions or acute bony abnormality. IMPRESSION: Right base atelectasis. Electronically Signed   By: Rolm Baptise M.D.   On: 11/11/2015 07:14   Dg Abd 1 View  Result Date: 11/10/2015 CLINICAL DATA:  65 year old male status post abdominal surgery, aortic by femoral bypass. Initial encounter. EXAM: ABDOMEN - 1 VIEW COMPARISON:  11/09/2015 and earlier. FINDINGS: Supine view at 0803 hours. Midline skin staples remain in place. Bowel gas pattern remains non obstructed. Slightly more gas in the stomach today. Stable visualized osseous structures. No acute findings identified. IMPRESSION: Bowel gas pattern remains non obstructed. Stable and negative postoperative supine appearance of the abdomen. Electronically Signed   By: Genevie Ann M.D.   On: 11/10/2015 08:39   Dg Abd 1 View  Result Date: 11/09/2015 CLINICAL DATA:  Status post aortic bypass surgery. EXAM: ABDOMEN - 1 VIEW COMPARISON:  CT of the abdomen pelvis 09/19/2015 FINDINGS: The bowel gas pattern is normal. No radio-opaque calculi or other significant radiographic abnormality are seen. Skin staples noted. No evidence of free gas on this supine radiograph. IMPRESSION: Nonobstructive bowel gas pattern. Electronically Signed   By: Fidela Salisbury M.D.   On: 11/09/2015 10:12   Ct Head Wo Contrast  Result Date: 11/09/2015 CLINICAL DATA:  Change in mental status EXAM: CT HEAD WITHOUT CONTRAST TECHNIQUE: Contiguous axial images were obtained from the base of the skull through the vertex without intravenous contrast.  COMPARISON:  Brain MRI 09/20/2015 FINDINGS: Brain: No intracranial hemorrhage, mass effect or midline shift. Stable cerebral atrophy. Stable chronic white matter disease. Again noted old infarct in left PCA territory. No definite acute cortical infarction. No mass lesion is noted on this unenhanced scan. Vascular: Atherosclerotic calcifications of carotid siphon. Skull: No skull fracture. Sinuses/Orbits: Unremarkable Other: None IMPRESSION: No acute intracranial abnormality. 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 No definite acute cortical infarction. Stable atrophy and chronic white matter disease. Electronically Signed   By: Lahoma Crocker M.D.   On: 11/09/2015 12:29       --Marda Stalker, MD.  Board Certified in Internal Medicine, Pulmonary Medicine, [place], and Sleep Medicine.  ICU Pager [phone]  Pulmonary and Critical Care Office Number: [phone]  Patricia Pesa, M.D.  Vilinda Boehringer, M.D.  Merton Border, M.D   11/11/2015, 9:04 AM  Critical Care Attestation.  I have personally obtained a history, examined the patient, evaluated laboratory and imaging results, formulated the assessment and plan and placed orders. The Patient requires high complexity decision making for  assessment and support, frequent evaluation and titration of therapies, application of advanced monitoring technologies and extensive interpretation of multiple databases. The patient has critical illness that could lead imminently to failure of 1 or more organ systems and requires the highest level of physician preparedness to intervene.  Critical Care Time devoted to patient care services described in this note is 35 minutes and is exclusive of time spent in procedures.

## 2015-11-11 NOTE — Evaluation (Signed)
Physical Therapy Evaluation Patient Details Name: Gary Mccoy. MRN: [id number] DOB: 1950/09/28 Today's Date: 11/11/2015   History of Present Illness  Pt is ~8 weeks post CVA - he was going to outpatient PT for R LE residual weakness, etc.  He is here with circulatory issues in b/l LEs and ultimately had b/l femoral bypass sx and admitted to the CCU  Clinical Impression  Pt is eager to work with PT and reports feeling like he is doing well regarding his vascular surgeries but is having some swelling/tenderness/pain in his L hip.  Pt was able to walk with and w/o AD but was guarded and slow with the effort.  He was fatigued with the effort and his HR did rise to the 140s but overall he did well.     Follow Up Recommendations      Equipment Recommendations       Recommendations for Other Services       Precautions / Restrictions Precautions Precautions: Fall Restrictions Weight Bearing Restrictions: No      Mobility  Bed Mobility               General bed mobility comments: In recliner on arricval, not tested  Transfers Overall transfer level: Modified independent Equipment used: Rolling walker (2 wheeled)             General transfer comment: Pt able to get to standing with b/l UEs and good use of R LE   Ambulation/Gait Ambulation/Gait assistance: Min assist Ambulation Distance (Feet): 15 Feet Assistive device: Rolling walker (2 wheeled);None       General Gait Details: Pt is able to walk ~8 ft fwd back with walker and then again w/o AD.  He was eager to do all he could but was clearly fatiguing/weak and had HR slowly increase from 120s to 140s.   Stairs            Wheelchair Mobility    Modified Rankin (Stroke Patients Only)       Balance Overall balance assessment: Modified Independent                                           Pertinent Vitals/Pain      Home Living Family/patient expects to be discharged to::  Private residence Living Arrangements: Spouse/significant other (wife works part time and is gone regularly) Available Help at Discharge: Family Type of Home: House Home Access: Stairs to enter;Ramped entrance     Home Layout: One level        Prior Function Level of Independence: Independent         Comments: Pt had gotten off the cane and was ambulating w/o AD.       Hand Dominance        Extremity/Trunk Assessment   Upper Extremity Assessment: Overall WFL for tasks assessed           Lower Extremity Assessment: Overall WFL for tasks assessed (mild weakness with R ankle DF, eversion - functional t/o)         Communication   Communication: No difficulties  Cognition Arousal/Alertness: Awake/alert Behavior During Therapy: WFL for tasks assessed/performed Overall Cognitive Status: Within Functional Limits for tasks assessed                      General Comments      Exercises  Assessment/Plan    PT Assessment Patient needs continued PT services  PT Diagnosis Difficulty walking;Generalized weakness   PT Problem List Decreased strength;Decreased activity tolerance;Decreased balance;Decreased mobility;Decreased coordination;Decreased knowledge of use of DME;Decreased safety awareness  PT Treatment Interventions DME instruction;Gait training;Stair training;Functional mobility training;Therapeutic activities;Therapeutic exercise;Balance training   PT Goals (Current goals can be found in the Care Plan section) Acute Rehab PT Goals Patient Stated Goal: go home PT Goal Formulation: With patient Time For Goal Achievement: 11/25/15 Potential to Achieve Goals: Fair    Frequency Min 2X/week   Barriers to discharge        Co-evaluation               End of Session                 Time: 3606-7703 PT Time Calculation (min) (ACUTE ONLY): 25 min   Charges:   PT Evaluation $PT Eval Low Complexity: 1 Procedure     PT G CodesKreg Shropshire, DPT 11/11/2015, 2:55 PM

## 2015-11-12 LAB — GLUCOSE, CAPILLARY
GLUCOSE-CAPILLARY: 108 mg/dL — AB (ref 65–99)
GLUCOSE-CAPILLARY: 96 mg/dL (ref 65–99)
Glucose-Capillary: 100 mg/dL — ABNORMAL HIGH (ref 65–99)
Glucose-Capillary: 112 mg/dL — ABNORMAL HIGH (ref 65–99)
Glucose-Capillary: 96 mg/dL (ref 65–99)
Glucose-Capillary: 96 mg/dL (ref 65–99)

## 2015-11-12 LAB — BASIC METABOLIC PANEL
ANION GAP: 5 (ref 5–15)
BUN: 13 mg/dL (ref 6–20)
CO2: 24 mmol/L (ref 22–32)
Calcium: 8.1 mg/dL — ABNORMAL LOW (ref 8.9–10.3)
Chloride: 111 mmol/L (ref 101–111)
Creatinine, Ser: 0.92 mg/dL (ref 0.61–1.24)
Glucose, Bld: 108 mg/dL — ABNORMAL HIGH (ref 65–99)
POTASSIUM: 3.3 mmol/L — AB (ref 3.5–5.1)
SODIUM: 140 mmol/L (ref 135–145)

## 2015-11-12 LAB — CBC
HCT: 35.1 % — ABNORMAL LOW (ref 40.0–52.0)
Hemoglobin: 12.6 g/dL — ABNORMAL LOW (ref 13.0–18.0)
MCH: 32 pg (ref 26.0–34.0)
MCHC: 35.8 g/dL (ref 32.0–36.0)
MCV: 89.3 fL (ref 80.0–100.0)
PLATELETS: 107 10*3/uL — AB (ref 150–440)
RBC: 3.93 MIL/uL — AB (ref 4.40–5.90)
RDW: 15.2 % — ABNORMAL HIGH (ref 11.5–14.5)
WBC: 10.2 10*3/uL (ref 3.8–10.6)

## 2015-11-12 MED ORDER — FUROSEMIDE 10 MG/ML IJ SOLN
INTRAMUSCULAR | Status: AC
  Administered 2015-11-12: 20 mg via INTRAVENOUS
  Filled 2015-11-12: qty 2

## 2015-11-12 MED ORDER — POTASSIUM CHLORIDE 10 MEQ/50ML IV SOLN
10.0000 meq | INTRAVENOUS | Status: AC
  Administered 2015-11-12 (×4): 10 meq via INTRAVENOUS
  Filled 2015-11-12 (×4): qty 50

## 2015-11-12 MED ORDER — FUROSEMIDE 10 MG/ML IJ SOLN
20.0000 mg | Freq: Once | INTRAMUSCULAR | Status: AC
  Administered 2015-11-12: 20 mg via INTRAVENOUS

## 2015-11-12 NOTE — Progress Notes (Signed)
4 Days Post-Op  Subjective: Doing OK. Notes new cough. Mild incisional pain-well controlled with current regimen. Tolerating Clears. Denies nausea. Some flatulence.  Objective: Vital signs in last 24 hours: Temp:  [97.7 F (36.5 C)-98.8 F (37.1 C)] 97.9 F (36.6 C) (08/27 0538) Pulse Rate:  [76-137] 76 (08/27 0538) Resp:  [17-29] 18 (08/27 0538) BP: (92-176)/(64-95) 142/85 (08/27 0538) SpO2:  [92 %-100 %] 100 % (08/27 0538) Weight:  [97.3 kg (214 lb 8 oz)] 97.3 kg (214 lb 8 oz) (08/26 2027) Last BM Date: 11/13/15  Intake/Output from previous day: 08/26 0701 - 08/27 0700 In: 1837.3 [I.V.:1837.3] Out: 2475 [Urine:2475] Intake/Output this shift: No intake/output data recorded.  Alert, NAD, appropriate Neck: Right IJ - with out swelling or drainage - no signs of infection CV: RRR Pulmonary: Mild rhonchi Bilaterally Abdomen: dressings clean,dry and intact. Mild distention. Soft. Mild tenderness to palpation at incisions Groin Incisions: C/D/I, soft Urologic: foley in place, draining clear urine Vascular: Bilateral lower extremity warm, with minimal edema. Palp Left DP, Right PT, mild edema  Lab Results:   Recent Labs  11/11/15 0442 11/12/15 0616  WBC 10.1 10.2  HGB 12.8* 12.6*  HCT 36.3* 35.1*  PLT 87* 107*   BMET  Recent Labs  11/11/15 0442 11/12/15 0616  NA 138 140  K 3.5 3.3*  CL 112* 111  CO2 20* 24  GLUCOSE 102* 108*  BUN 17 13  CREATININE 0.86 0.92  CALCIUM 7.8* 8.1*   PT/INR No results for input(s): LABPROT, INR in the last 72 hours. ABG No results for input(s): PHART, HCO3 in the last 72 hours.  Invalid input(s): PCO2, PO2  Studies/Results:   Anti-infectives: Anti-infectives    Start     Dose/Rate Route Frequency Ordered Stop   11/08/15 1700  ceFAZolin (ANCEF) IVPB 1 g/50 mL premix     1 g 100 mL/hr over 30 Minutes Intravenous Every 8 hours 11/08/15 1258 11/09/15 0625   11/08/15 0637  ceFAZolin (ANCEF) 2-4 GM/100ML-% IVPB     Comments:  Rexanne Mano: cabinet override      11/08/15 2025 11/08/15 1844   11/08/15 0250  ceFAZolin (ANCEF) IVPB 2g/100 mL premix     2 g 200 mL/hr over 30 Minutes Intravenous On call to O.R. 11/08/15 0250 11/08/15 0903      Assessment/Plan: s/p Procedure(s) with comments: AORTA BIFEMORAL BYPASS GRAFT (N/A) ENDARTERECTOMY FEMORAL (Bilateral) - common, SFA, and profundis  Aortic endarterectomy   Needs diuresis- Lasix, Replete K, keep foley for now- Monitor Urine output  Needs rate control- Metoprolol for now however, will require PO regimen once tolerating PO fully. Clears OOB to chair today Pulmonary toliet    LOS: 4 days    Jamesetta So A 11/12/2015

## 2015-11-12 NOTE — Progress Notes (Signed)
Patient transferred to room 251 from the unit. Alert and oriented x 4. Denied any acute pain. No respiratory distress noted. Tele box called to CCMD with Gelene Mink. as  a second verifier. Skin assessment done with Adrienne as well, noted honeycomb dressings to bilateral groins and mid abdomen with old drainage. Patient has not voiced complaint. Will continue to monitor.

## 2015-11-12 NOTE — Progress Notes (Signed)
Pt. Has 2-3+ scrotal edema. He states there is no pain.

## 2015-11-13 LAB — GLUCOSE, CAPILLARY
GLUCOSE-CAPILLARY: 107 mg/dL — AB (ref 65–99)
GLUCOSE-CAPILLARY: 85 mg/dL (ref 65–99)
GLUCOSE-CAPILLARY: 90 mg/dL (ref 65–99)
GLUCOSE-CAPILLARY: 97 mg/dL (ref 65–99)
Glucose-Capillary: 101 mg/dL — ABNORMAL HIGH (ref 65–99)
Glucose-Capillary: 115 mg/dL — ABNORMAL HIGH (ref 65–99)
Glucose-Capillary: 98 mg/dL (ref 65–99)

## 2015-11-13 MED ORDER — METOPROLOL SUCCINATE ER 100 MG PO TB24
100.0000 mg | ORAL_TABLET | Freq: Every day | ORAL | Status: DC
  Administered 2015-11-13 – 2015-11-14 (×2): 100 mg via ORAL
  Filled 2015-11-13 (×2): qty 1

## 2015-11-13 MED ORDER — FUROSEMIDE 40 MG PO TABS
40.0000 mg | ORAL_TABLET | Freq: Once | ORAL | Status: AC
  Administered 2015-11-14: 40 mg via ORAL
  Filled 2015-11-13: qty 1

## 2015-11-13 MED ORDER — RIVAROXABAN 20 MG PO TABS
20.0000 mg | ORAL_TABLET | Freq: Every day | ORAL | Status: DC
  Administered 2015-11-14 – 2015-11-15 (×2): 20 mg via ORAL
  Filled 2015-11-13 (×3): qty 1

## 2015-11-13 MED ORDER — POTASSIUM CHLORIDE CRYS ER 20 MEQ PO TBCR
40.0000 meq | EXTENDED_RELEASE_TABLET | Freq: Two times a day (BID) | ORAL | Status: DC
  Administered 2015-11-13 – 2015-11-14 (×2): 40 meq via ORAL
  Filled 2015-11-13 (×2): qty 2

## 2015-11-13 NOTE — Progress Notes (Signed)
Pt. Has been awake all night watching T.V. He requested cough medicine twice. He stated he was coughing and producing mucus but was unable to show me any mucus. I did not hear the pt. Coughing during the night. Pt. Was very pleasant to work with. Pt. ambulated from bed to chair. He has not had difficulty urinating after catheter removal. Will continue to monitor pt.

## 2015-11-13 NOTE — Care Management (Addendum)
Message left for patient's wife to call this RNCM  Gary Mccoy, Gary Mccoy Spouse   [phone]   To discuss discharge planning.. Patient has ambulated with RN from bed to chair. PT- Ambulation Distance (Feet): 15 Feet.  Per notes patient was going to outpatient PT for RLE weakness. He is currently on room air. POD 5 today from AORTA BIFEMORAL BYPASS GRAFT (N/A) ENDARTERECTOMY FEMORAL (Bilateral) - common, SFA, and profundis. Aortic endarterectomy. Transferred to 2a on 8/26 from ICU. I spoke with patient and he refused HHPT or [facility].  He wants to return to [facility]- "just not until my abdomen heals". PCP- Dr. Emily Filbert. He states he has been using a cane prior to this admission after a CVA. He has a HurryCane at home and a front-wheeled walker from his mother-in-law available to use. He denies RNCM needs.  11/13/15 1302: Wife called back and is not concerned about taking patient home. She feels that he can make a logical decision about no wanting home health. She also confirmed that he will have a walker available at home.

## 2015-11-13 NOTE — Progress Notes (Signed)
Physical Therapy Treatment Patient Details Name: Gary Mccoy. MRN: [id number] DOB: [birth year] Today's Date: 12-06-2015    History of Present Illness Pt is ~8 weeks post CVA - he was going to outpatient PT for R LE residual weakness, etc.  He is here with circulatory issues in b/l LEs and ultimately had b/l femoral bypass sx and admitted to the CCU    PT Comments    Pt presented seated at EOB with nsg. Pt agreeable to participate.  Demonstrated good safety with transfers using RW, pt was able to ambulation 376f x 1 with RW and CGA with min cues for erect posture. Pt with noted fatigue after ambulation with HR increasing to 135.  Pt left in chair with all needs met.   Follow Up Recommendations        Equipment Recommendations       Recommendations for Other Services       Precautions / Restrictions Precautions Precautions: Fall Restrictions Weight Bearing Restrictions: No    Mobility  Bed Mobility               General bed mobility comments: Pt sitting at EOB upon arrival   Transfers Overall transfer level: Modified independent Equipment used: Rolling walker (2 wheeled)             General transfer comment: Pt demonstrated good safety with RW during transfer  Ambulation/Gait Ambulation/Gait assistance: Min guard Ambulation Distance (Feet): 320 Feet Assistive device: Rolling walker (2 wheeled)       General Gait Details: HR increased to 135   Stairs            Wheelchair Mobility    Modified Rankin (Stroke Patients Only)       Balance Overall balance assessment: Modified Independent                                  Cognition Arousal/Alertness: Awake/alert Behavior During Therapy: WFL for tasks assessed/performed Overall Cognitive Status: Within Functional Limits for tasks assessed                      Exercises      General Comments        Pertinent Vitals/Pain      Home Living                       Prior Function            PT Goals (current goals can now be found in the care plan section) Acute Rehab PT Goals Patient Stated Goal: go home    Frequency  Min 2X/week    PT Plan      Co-evaluation             End of Session   Activity Tolerance: Patient tolerated treatment well Patient left: in chair;with call bell/phone within reach;with chair alarm set     Time: 1140-1200 PT Time Calculation (min) (ACUTE ONLY): 20 min  Charges:  $Therapeutic Activity: 8-22 mins                    G Codes:      Fusako Tanabe 82017/09/20 12:43 PM

## 2015-11-13 NOTE — Progress Notes (Signed)
[facility]  Daily Progress Note   Subjective  - 5 Days Post-Op  Patient tolerating clear liquids still passing flatus urinating well pain is well controlled  Objective Vitals:   11/12/15 1931 11/13/15 0544 11/13/15 0844 11/13/15 1143  BP: 129/63 (!) 137/55 138/75 (!) 132/92  Pulse: (!) 103 (!) 113 (!) 105 (!) 117  Resp: _0 Temp: 97.4 F (36.3 C) 99.4 F (37.4 C) 98.1 F (36.7 C) 98.1 F (36.7 C)  TempSrc: Oral Oral Oral Oral  SpO2: 97% 96% 97% 96%  Weight:      Height:        Intake/Output Summary (Last 24 hours) at 11/13/15 1824 Last data filed at 11/13/15 1541  Gross per 24 hour  Intake             1520 ml  Output             1225 ml  Net              295 ml    PULM  Normal effort , no use of accessory muscles CV  No JVD, RRR Abd      No distended, nontender VASC  Incisions clean dry and and intact pedal pulses are palpable 2+ pitting edema  Laboratory CBC    Component Value Date/Time   WBC 10.2 11/12/2015 0616   HGB 12.6 (L) 11/12/2015 0616   HCT 35.1 (L) 11/12/2015 0616   PLT 107 (L) 11/12/2015 0616    BMET    Component Value Date/Time   NA 140 11/12/2015 0616   K 3.3 (L) 11/12/2015 0616   CL 111 11/12/2015 0616   CO2 24 11/12/2015 0616   GLUCOSE 108 (H) 11/12/2015 0616   BUN 13 11/12/2015 0616   CREATININE 0.92 11/12/2015 0616   CALCIUM 8.1 (L) 11/12/2015 0616   GFRNONAA >60 11/12/2015 0616   GFRAA >60 11/12/2015 0616    Assessment/Planning: POD #5 s/p aortobifemoral   Potassium remains slightly low and therefore I will replete this and recheck laboratory tests in the morning  He remains edematous and I will give a dose of Lasix tomorrow  I will restart his oral medications  I will advance his diet  Katha Cabal  11/13/2015, 6:24 PM

## 2015-11-14 ENCOUNTER — Ambulatory Visit: Payer: Managed Care, Other (non HMO) | Admitting: Physical Therapy

## 2015-11-14 LAB — BASIC METABOLIC PANEL
ANION GAP: 2 — AB (ref 5–15)
BUN: 17 mg/dL (ref 6–20)
CALCIUM: 8.1 mg/dL — AB (ref 8.9–10.3)
CHLORIDE: 108 mmol/L (ref 101–111)
CO2: 29 mmol/L (ref 22–32)
Creatinine, Ser: 0.94 mg/dL (ref 0.61–1.24)
GFR calc Af Amer: >60 mL/min (ref >60)
GFR calc non Af Amer: >60 mL/min (ref >60)
GLUCOSE: 100 mg/dL — AB (ref 65–99)
Potassium: 3.6 mmol/L (ref 3.5–5.1)
Sodium: 139 mmol/L (ref 135–145)

## 2015-11-14 LAB — GLUCOSE, CAPILLARY
Glucose-Capillary: 97 mg/dL (ref 65–99)
Glucose-Capillary: 96 mg/dL (ref 65–99)
Glucose-Capillary: 144 mg/dL — ABNORMAL HIGH (ref 65–99)
Glucose-Capillary: 110 mg/dL — ABNORMAL HIGH (ref 65–99)
Glucose-Capillary: 124 mg/dL — ABNORMAL HIGH (ref 65–99)

## 2015-11-14 MED ORDER — METOPROLOL SUCCINATE ER 25 MG PO TB24
12.5000 mg | ORAL_TABLET | Freq: Every day | ORAL | Status: DC
  Administered 2015-11-15: 12.5 mg via ORAL
  Filled 2015-11-14 (×2): qty 1

## 2015-11-14 MED ORDER — SODIUM CHLORIDE 0.9% FLUSH
10.0000 mL | Freq: Two times a day (BID) | INTRAVENOUS | Status: DC
  Administered 2015-11-14: 10 mL

## 2015-11-14 MED ORDER — SODIUM CHLORIDE 0.9% FLUSH
10.0000 mL | INTRAVENOUS | Status: DC | PRN
  Administered 2015-11-14: 10 mL
  Filled 2015-11-14: qty 10

## 2015-11-14 NOTE — Progress Notes (Signed)
Physical Therapy Treatment Patient Details Name: Gary Mccoy. MRN: [id number] DOB: 10/01/1950 Today's Date: 11-16-15    History of Present Illness Pt is ~8 weeks post CVA - he was going to outpatient PT for R LE residual weakness, etc.  He is here with circulatory issues in b/l LEs and ultimately had b/l femoral bypass sx and admitted to the CCU    PT Comments    Pt presented in chair agreeable for therapy. Pt stating feeling increased fatigue today, ambulated 347f x 1 with RW and no rest breaks.  Pt using RW for energy conservation, HR increased to 102 with ambulation. Pt returned to room with all current needs met.   Follow Up Recommendations        Equipment Recommendations       Recommendations for Other Services       Precautions / Restrictions Precautions Precautions: Fall Restrictions Weight Bearing Restrictions: No    Mobility  Bed Mobility               General bed mobility comments: In recliner agreeable to therapy  Transfers Overall transfer level: Modified independent Equipment used: Rolling walker (2 wheeled)             General transfer comment: Safe technique with transfer  Ambulation/Gait Ambulation/Gait assistance: Min guard Ambulation Distance (Feet): 320 Feet Assistive device: Rolling walker (2 wheeled)       General Gait Details: Max HR 102 with ambulation    Stairs            Wheelchair Mobility    Modified Rankin (Stroke Patients Only)       Balance Overall balance assessment: Modified Independent                                  Cognition Arousal/Alertness: Awake/alert Behavior During Therapy: WFL for tasks assessed/performed Overall Cognitive Status: Within Functional Limits for tasks assessed                      Exercises      General Comments        Pertinent Vitals/Pain      Home Living                      Prior Function            PT Goals  (current goals can now be found in the care plan section) Acute Rehab PT Goals Patient Stated Goal: go home Progress towards PT goals: Progressing toward goals    Frequency  Min 2X/week    PT Plan      Co-evaluation             End of Session   Activity Tolerance: Patient tolerated treatment well Patient left: in chair;with call bell/phone within reach;with chair alarm set     Time: 1433-1445 PT Time Calculation (min) (ACUTE ONLY): 12 min  Charges:  $Therapeutic Activity: 8-22 mins                    G Codes:      Litisha Guagliardo 8August 31, 2017 2:46 PM Cynethia Schindler, PTA

## 2015-11-14 NOTE — Care Management (Signed)
Barrier to discharge-Diet not advanced until today.  Have paged attending to discuss anticipated discharge.

## 2015-11-14 NOTE — Progress Notes (Signed)
[facility]  Daily Progress Note   Subjective  - 6 Days Post-Op  Patient tolerating regular diet passing flatus urinating well pain is well controlled  Objective Vitals:   11/13/15 1921 11/14/15 0443 11/14/15 0740 11/14/15 1202  BP: 124/65 105/60 134/67 (!) 92/41  Pulse: 96 89 84 74  Resp: _0 Temp: 98.2 F (36.8 C) 98.2 F (36.8 C) 98.2 F (36.8 C) 98.6 F (37 C)  TempSrc: Oral Oral Oral Oral  SpO2: 99% 99% 99% 97%  Weight:      Height:        Intake/Output Summary (Last 24 hours) at 11/14/15 1818 Last data filed at 11/14/15 1557  Gross per 24 hour  Intake              732 ml  Output             1675 ml  Net             -943 ml    PULM  Normal effort , no use of accessory muscles CV  No JVD, RRR Abd      No distended, nontender VASC  Incisions clean dry and and intact pedal pulses are palpable 2+ pitting edema  Laboratory CBC    Component Value Date/Time   WBC 10.2 11/12/2015 0616   HGB 12.6 (L) 11/12/2015 0616   HCT 35.1 (L) 11/12/2015 0616   PLT 107 (L) 11/12/2015 0616    BMET    Component Value Date/Time   NA 139 11/14/2015 0426   K 3.6 11/14/2015 0426   CL 108 11/14/2015 0426   CO2 29 11/14/2015 0426   GLUCOSE 100 (H) 11/14/2015 0426   BUN 17 11/14/2015 0426   CREATININE 0.94 11/14/2015 0426   CALCIUM 8.1 (L) 11/14/2015 0426   GFRNONAA >60 11/14/2015 0426   GFRAA >60 11/14/2015 0426    Assessment/Planning: POD #6 s/p aortobifemoral   Potassium isCorrected I will discontinue his Kader  His blood pressure has been low today but his heart rate is now well controlled I will reduce the beta block  Continue ambulation recheck labs in the morning hopefully discharged home tomorrow a.m.      Schnier, Dolores Lory  11/14/2015, 6:18 PM

## 2015-11-15 LAB — CBC
HEMATOCRIT: 34.5 % — AB (ref 40.0–52.0)
Hemoglobin: 12.2 g/dL — ABNORMAL LOW (ref 13.0–18.0)
MCH: 31.6 pg (ref 26.0–34.0)
MCHC: 35.2 g/dL (ref 32.0–36.0)
MCV: 89.8 fL (ref 80.0–100.0)
PLATELETS: 174 10*3/uL (ref 150–440)
RBC: 3.84 MIL/uL — ABNORMAL LOW (ref 4.40–5.90)
RDW: 14.9 % — AB (ref 11.5–14.5)
WBC: 8.7 10*3/uL (ref 3.8–10.6)

## 2015-11-15 LAB — BASIC METABOLIC PANEL
Anion gap: 7 (ref 5–15)
BUN: 22 mg/dL — AB (ref 6–20)
CALCIUM: 7.9 mg/dL — AB (ref 8.9–10.3)
CO2: 25 mmol/L (ref 22–32)
Chloride: 108 mmol/L (ref 101–111)
Creatinine, Ser: 1.11 mg/dL (ref 0.61–1.24)
GFR calc Af Amer: >60 mL/min (ref >60)
GLUCOSE: 99 mg/dL (ref 65–99)
Potassium: 3.6 mmol/L (ref 3.5–5.1)
Sodium: 140 mmol/L (ref 135–145)

## 2015-11-15 MED ORDER — METOPROLOL SUCCINATE ER 25 MG PO TB24: 12.5000 mg | ORAL_TABLET | Freq: Every day | ORAL | 1 refills | Status: DC

## 2015-11-15 NOTE — Care Management (Signed)
Beta Blocker dose was decreased within last 24 hours. Heart rate and blood pressure have remained stable.  Anticipate discharge today.  No discharge needs.  Declines home health

## 2015-11-15 NOTE — Discharge Summary (Signed)
[facility]    Discharge Summary    Patient ID:  Gary Mccoy. MRN: [id number] DOB/AGE: 1950/10/04 65 y.o.  Admit date: 11/08/2015 Discharge date: 11/15/2015 Date of Surgery: 11/08/2015 Surgeon: Surgeon(s): Katha Cabal, MD Algernon Huxley, MD  Admission Diagnosis: ASO W REST PAIN  Discharge Diagnoses:  ASO W REST PAIN  Secondary Diagnoses: Past Medical History:  Diagnosis Date  . Arthritis    lower back  . Back pain   . Duodenal ulcer   . Elevated lipids   . GERD (gastroesophageal reflux disease)   . Leg weakness, bilateral    s/p lumbar surgery  . PAD (peripheral artery disease) ([place])   . Stroke ([place])    Rt lower leg  estimated 10 - 12 stokes in last 5 years  . Wears dentures    full upper and lower    Procedure(s): AORTA BIFEMORAL BYPASS GRAFT ENDARTERECTOMY FEMORAL  Discharged Condition: good  HPI:  Patient presented for elective aortobifemoral bypass. On the day of admission he underwent successful operation and was transferred to the intensive care unit in good condition. He extubated postoperatively. He lost approximately 450 cc of blood which was largely returned in Cell Saver. He now has palpable pulses in his foot. On postoperative day #1 he experienced significant change in his mental status which lasted for several days I believe this was secondary to administration of Dilaudid but this could also be related to Ativan. Nevertheless, with cessation of of these medications his mental status cleared and he returned to baseline in approximately 3 days. Over the course of the next 4 days he has been ambulating without difficulty tolerating regular diet and carrying on daily activities.  On postoperative day #7 he is felt fit for discharge. He is discharged home. He is improved condition.  Hospital Course:  Gary Mccoy. is a 65 y.o. male is S/P Bilateral Procedure(s): AORTA BIFEMORAL BYPASS GRAFT ENDARTERECTOMY  FEMORAL Extubated: POD # 0 Physical exam: Midline and both groin incisions are healing well. He has palpable right posterior tibial and left dorsalis pedis pulses. There is 2+ edema both feet. Post-op wounds healing well Pt. Ambulating, voiding and taking PO diet without difficulty. Pt pain controlled with PO pain meds. Labs as below Complications:none  Consults:  Treatment Team:  Laverle Hobby, MD  Significant Diagnostic Studies: CBC Lab Results  Component Value Date   WBC 8.7 11/15/2015   HGB 12.2 (L) 11/15/2015   HCT 34.5 (L) 11/15/2015   MCV 89.8 11/15/2015   PLT 174 11/15/2015    BMET    Component Value Date/Time   NA 140 11/15/2015 0347   K 3.6 11/15/2015 0347   CL 108 11/15/2015 0347   CO2 25 11/15/2015 0347   GLUCOSE 99 11/15/2015 0347   BUN 22 (H) 11/15/2015 0347   CREATININE 1.11 11/15/2015 0347   CALCIUM 7.9 (L) 11/15/2015 0347   GFRNONAA >60 11/15/2015 0347   GFRAA >60 11/15/2015 0347   COAG Lab Results  Component Value Date   INR 1.21 11/08/2015   INR 1.37 10/24/2015   INR 1.08 09/19/2015     Disposition:  Discharge to :Home Discharge Instructions    Call MD for:  redness, tenderness, or signs of infection (pain, swelling, bleeding, redness, odor or green/yellow discharge around incision site)    Complete by:  As directed   Call MD for:  severe or increased pain, loss or decreased feeling  in affected limb(s)  Complete by:  As directed   Call MD for:  temperature >100.5    Complete by:  As directed   Discharge instructions    Complete by:  As directed   Okay to shower   Driving Restrictions    Complete by:  As directed   No driving for 5 weeks   Lifting restrictions    Complete by:  As directed   No lifting greater than 15 pounds for 6 weeks   Resume previous diet    Complete by:  As directed       Medication List    TAKE these medications   aspirin 81 MG chewable tablet Chew 1 tablet (81 mg total) by mouth daily.    atorvastatin 40 MG tablet Commonly known as:  LIPITOR Take 1 tablet (40 mg total) by mouth daily at 6 PM.   celecoxib 200 MG capsule Commonly known as:  CELEBREX Take 200 mg by mouth daily.   metoprolol succinate 25 MG 24 hr tablet Commonly known as:  TOPROL-XL Take 0.5 tablets (12.5 mg total) by mouth daily.   MULTIVITAMIN ADULT PO Take 1 tablet by mouth daily.   rivaroxaban 20 MG Tabs tablet Commonly known as:  XARELTO Take 1 tablet (20 mg total) by mouth daily.   VENTOLIN HFA 108 (90 Base) MCG/ACT inhaler Generic drug:  albuterol Take 1-2 puffs by mouth 3 (three) times daily as needed. For congestion, shortness of breath, and/or wheezing.      Verbal and written Discharge instructions given to the patient. Wound care per Discharge AVS Follow-up Information    Kylan Liberati, Dolores Lory, MD Follow up in 1 week(s).   Specialties:  Vascular Surgery, Cardiology, Radiology, Vascular Surgery Why:  for Staple removal Contact information: [place] [address] [phone]           Signed: Katha Cabal, MD  11/15/2015, 4:08 PM

## 2015-11-15 NOTE — Progress Notes (Signed)
Physical Therapy Treatment Patient Details Name: Karla A Phil Dopp. MRN: [id number] DOB: October 15, 1950 Today's Date: 11/24/2015    History of Present Illness Pt is ~8 weeks post CVA - he was going to outpatient PT for R LE residual weakness, etc.  He is here with circulatory issues in b/l LEs and ultimately had b/l femoral bypass sx and admitted to the CCU    PT Comments    Pt presented in chair, discussed possible d/c today.  Pt stating feeling increased weakness in hips/LE since this am, but would like to try and ambulate.  Pt performed transfers and gait without AD today. Increased antalgic gait with decreased R dorsiflexion noted. Max HR 102 with ambulation, with pt stating feel better after gait.  Pt returned to room and left in chair with all current needs met.   Follow Up Recommendations        Equipment Recommendations       Recommendations for Other Services       Precautions / Restrictions Precautions Precautions: Fall Restrictions Weight Bearing Restrictions: No    Mobility  Bed Mobility               General bed mobility comments: In recliner  Transfers Overall transfer level: Modified independent Equipment used: None             General transfer comment: No LOB withou tAD  Ambulation/Gait Ambulation/Gait assistance: Min guard Ambulation Distance (Feet): 320 Feet Assistive device: None       General Gait Details: Max HR 102 with gait    Stairs            Wheelchair Mobility    Modified Rankin (Stroke Patients Only)       Balance Overall balance assessment: Modified Independent                                  Cognition Arousal/Alertness: Awake/alert Behavior During Therapy: WFL for tasks assessed/performed Overall Cognitive Status: Within Functional Limits for tasks assessed                      Exercises      General Comments        Pertinent Vitals/Pain      Home Living                       Prior Function            PT Goals (current goals can now be found in the care plan section) Acute Rehab PT Goals Patient Stated Goal: go home Progress towards PT goals: Progressing toward goals    Frequency  Min 2X/week    PT Plan      Co-evaluation             End of Session   Activity Tolerance: Patient tolerated treatment well Patient left: in chair;with call bell/phone within reach     Time: 1455-1508 PT Time Calculation (min) (ACUTE ONLY): 13 min  Charges:  $Therapeutic Activity: 8-22 mins                    G Codes:      Anhthu Perdew 2015/11/24, 3:14 PM  Wirt Hemmerich, PTA

## 2015-11-15 NOTE — Care Management (Signed)
Anticipate discharge this afternoon

## 2015-11-15 NOTE — Progress Notes (Signed)
Patient not feeling great.  Laying in bed.  States he feels like his BP has dropped.  Rechecked it and it was 113/64.  All other vitals are stable.

## 2015-11-16 ENCOUNTER — Ambulatory Visit: Payer: Managed Care, Other (non HMO) | Admitting: Physical Therapy

## 2015-12-01 ENCOUNTER — Encounter (INDEPENDENT_AMBULATORY_CARE_PROVIDER_SITE_OTHER): Payer: Self-pay

## 2015-12-28 ENCOUNTER — Other Ambulatory Visit (INDEPENDENT_AMBULATORY_CARE_PROVIDER_SITE_OTHER): Payer: Self-pay | Admitting: Vascular Surgery

## 2015-12-28 DIAGNOSIS — I700 Atherosclerosis of aorta: Secondary | ICD-10-CM

## 2015-12-28 DIAGNOSIS — I70223 Atherosclerosis of native arteries of extremities with rest pain, bilateral legs: Secondary | ICD-10-CM

## 2015-12-29 DIAGNOSIS — E538 Deficiency of other specified B group vitamins: Secondary | ICD-10-CM | POA: Insufficient documentation

## 2016-01-01 ENCOUNTER — Ambulatory Visit (INDEPENDENT_AMBULATORY_CARE_PROVIDER_SITE_OTHER): Payer: Self-pay | Admitting: Vascular Surgery

## 2016-01-01 ENCOUNTER — Encounter (INDEPENDENT_AMBULATORY_CARE_PROVIDER_SITE_OTHER): Payer: Self-pay | Admitting: Vascular Surgery

## 2016-01-01 ENCOUNTER — Ambulatory Visit (INDEPENDENT_AMBULATORY_CARE_PROVIDER_SITE_OTHER): Payer: Managed Care, Other (non HMO)

## 2016-01-01 ENCOUNTER — Ambulatory Visit (INDEPENDENT_AMBULATORY_CARE_PROVIDER_SITE_OTHER): Payer: Managed Care, Other (non HMO) | Admitting: Vascular Surgery

## 2016-01-01 VITALS — BP 125/77 | HR 89 | Resp 17 | Wt 190.0 lb

## 2016-01-01 DIAGNOSIS — Z716 Tobacco abuse counseling: Secondary | ICD-10-CM | POA: Diagnosis not present

## 2016-01-01 DIAGNOSIS — I739 Peripheral vascular disease, unspecified: Secondary | ICD-10-CM

## 2016-01-01 DIAGNOSIS — E785 Hyperlipidemia, unspecified: Secondary | ICD-10-CM

## 2016-01-01 DIAGNOSIS — I700 Atherosclerosis of aorta: Secondary | ICD-10-CM | POA: Diagnosis not present

## 2016-01-01 DIAGNOSIS — I10 Essential (primary) hypertension: Secondary | ICD-10-CM

## 2016-01-01 DIAGNOSIS — I70223 Atherosclerosis of native arteries of extremities with rest pain, bilateral legs: Secondary | ICD-10-CM | POA: Diagnosis not present

## 2016-01-01 NOTE — Progress Notes (Signed)
Subjective:    Patient ID: Gary Mccoy., male    DOB: 1950-04-12, 65 y.o.   MRN: [id number] Chief Complaint  Patient presents with  . Follow-up   Patient presents to review studies. Last seen in the office on 11/27/15. He is s/p an aorto-bifemoral bypass on 11/08/15. Patients presents today without complaint. His incisions are healing well.  The patient underwent an ABI which showed Right ABI: 0.96 and Left 1.03 (no previous for comparison). The patient denies any claudication like symptoms, rest pain or ulcers to his feet / toes.    Review of Systems  HENT: Negative.   Eyes: Negative.   Respiratory: Negative.   Cardiovascular: Negative.   Gastrointestinal: Negative.   Endocrine: Negative.   Genitourinary: Negative.   Musculoskeletal: Negative.   Skin: Negative.   Allergic/Immunologic: Negative.   Neurological: Positive for weakness (Left Sided s/p Stroke).  Hematological: Negative.   Psychiatric/Behavioral: Negative.       Objective:   Physical Exam  Constitutional: He is oriented to person, place, and time. He appears well-developed and well-nourished.  HENT:  Head: Normocephalic and atraumatic.  Eyes: Conjunctivae and EOM are normal. Pupils are equal, round, and reactive to light.  Neck: Normal range of motion.  Cardiovascular: Normal rate, regular rhythm, normal heart sounds and intact distal pulses.   Pulses:      Popliteal pulses are 2+ on the right side, and 2+ on the left side.       Dorsalis pedis pulses are 2+ on the right side, and 2+ on the left side.       Posterior tibial pulses are 2+ on the right side, and 2+ on the left side.  Musculoskeletal: Normal range of motion. He exhibits no edema.  Neurological: He is alert and oriented to person, place, and time.  Skin: Skin is warm and dry.  Psychiatric: He has a normal mood and affect. His behavior is normal. Judgment and thought content normal.   BP 125/77   Pulse 89   Resp 17   Wt 190 lb (86.2 kg)    BMI 25.07 kg/m   Past Medical History:  Diagnosis Date  . Arthritis    lower back  . Back pain   . Duodenal ulcer   . Elevated lipids   . GERD (gastroesophageal reflux disease)   . Leg weakness, bilateral    s/p lumbar surgery  . PAD (peripheral artery disease) ([place])   . Stroke ([place])    Rt lower leg  estimated 10 - 12 stokes in last 5 years  . Wears dentures    full upper and lower   Social History   Social History  . Marital status: Married    Spouse name: N/A  . Number of children: N/A  . Years of education: N/A   Occupational History  . Not on file.   Social History Main Topics  . Smoking status: Current Every Day Smoker    Packs/day: 1.00    Years: 45.00    Types: Cigarettes  . Smokeless tobacco: Never Used  . Alcohol use Yes     Comment: rare 3 a year  . Drug use: No  . Sexual activity: Not on file   Other Topics Concern  . Not on file   Social History Narrative  . No narrative on file   Past Surgical History:  Procedure Laterality Date  . AORTA - BILATERAL FEMORAL ARTERY BYPASS GRAFT N/A 11/08/2015   Procedure: AORTA BIFEMORAL BYPASS GRAFT;  Surgeon: Katha Cabal, MD;  Location: [facility] ORS;  Service: Vascular;  Laterality: N/A;  . BACK SURGERY  2005  . [place]  . ENDARTERECTOMY FEMORAL Bilateral 11/08/2015   Procedure: ENDARTERECTOMY FEMORAL;  Surgeon: Katha Cabal, MD;  Location: [facility] ORS;  Service: Vascular;  Laterality: Bilateral;  common, SFA, and profundis  Aortic endarterectomy   . ESOPHAGOGASTRODUODENOSCOPY (EGD) WITH PROPOFOL N/A 09/04/2015   Procedure: ESOPHAGOGASTRODUODENOSCOPY (EGD) WITH PROPOFOL;  Surgeon: Lucilla Lame, MD;  Location: [place];  Service: Endoscopy;  Laterality: N/A;  . TEE WITHOUT CARDIOVERSION N/A 09/22/2015   Procedure: TRANSESOPHAGEAL ECHOCARDIOGRAM (TEE);  Surgeon: Teodoro Spray, MD;  Location: [facility] ORS;  Service: Cardiovascular;  Laterality: N/A;   Family History  Problem  Relation Age of Onset  . Heart attack Mother   . Hypertension Mother   . Varicose Veins Mother   . Diabetes Father   . Heart attack Father   . Heart attack Maternal Grandmother   . Stroke Maternal Grandmother    Allergies  Allergen Reactions  . Morphine And Related Itching      Assessment & Plan:  Patient presents to review studies. Last seen in the office on 11/27/15. He is s/p an aorto-bifemoral bypass on 11/08/15. Patients presents today without complaint. His incisions are healing well.  The patient underwent an ABI which showed Right ABI: 0.96 and Left 1.03 (no previous for comparison). The patient denies any claudication like symptoms, rest pain or ulcers to his feet / toes.   1. Hyperlipidemia, unspecified hyperlipidemia type - Stable On ASA and Statin for medical optimization. Encouraged good control as its slows the progression of atherosclerotic disease.  2. Peripheral vascular disease (HCC) - Stable Asymptomatic. Normal ABI's. Physical exam unremarkable. No intervention needed at this time.   3. Benign essential HTN - Stable Encouraged good control as its slows the progression of atherosclerotic disease  4. Tobacco abuse counseling - Stable I have discussed with the patient the role of tobacco in the pathogenesis of atherosclerosis and its effect on the progression of the disease, impact on the durability of interventions and its limitations on the formation of collateral pathways. I have recommended absolute tobacco cessation. I have discussed various options available for assistance with tobacco cessation including over the counter methods (Nicotine gum, patch and lozenges). We also discussed prescription options (Chantix, Nicotine Inhaler / Nasal Spray). The patient is not interested in pursuing any prescription tobacco cessation options at this time. The patient voices their understanding.   Current Outpatient Prescriptions on File Prior to Visit  Medication Sig Dispense  Refill  . aspirin 81 MG chewable tablet Chew 1 tablet (81 mg total) by mouth daily.    Marland Kitchen atorvastatin (LIPITOR) 40 MG tablet Take 1 tablet (40 mg total) by mouth daily at 6 PM. 30 tablet 1  . celecoxib (CELEBREX) 200 MG capsule Take 200 mg by mouth daily.  11  . metoprolol succinate (TOPROL-XL) 25 MG 24 hr tablet Take 0.5 tablets (12.5 mg total) by mouth daily. 30 tablet 1  . Multiple Vitamins-Minerals (MULTIVITAMIN ADULT PO) Take 1 tablet by mouth daily.     . rivaroxaban (XARELTO) 20 MG TABS tablet Take 1 tablet (20 mg total) by mouth daily. 30 tablet 1  . VENTOLIN HFA 108 (90 Base) MCG/ACT inhaler Take 1-2 puffs by mouth 3 (three) times daily as needed. For congestion, shortness of breath, and/or wheezing.  0   No current facility-administered medications on file prior to visit.  There are no Patient Instructions on file for this visit. Return in about 3 months (around 04/02/2016) for ABI and Aorto-Iliac Duplex.   Addison Whidbee A Estefanie Cornforth, PA-C

## 2016-01-01 NOTE — Progress Notes (Signed)
fol

## 2016-04-09 ENCOUNTER — Other Ambulatory Visit (INDEPENDENT_AMBULATORY_CARE_PROVIDER_SITE_OTHER): Payer: Self-pay | Admitting: Vascular Surgery

## 2016-04-09 DIAGNOSIS — I63232 Cerebral infarction due to unspecified occlusion or stenosis of left carotid arteries: Secondary | ICD-10-CM

## 2016-04-09 DIAGNOSIS — I6523 Occlusion and stenosis of bilateral carotid arteries: Secondary | ICD-10-CM

## 2016-04-10 ENCOUNTER — Encounter (INDEPENDENT_AMBULATORY_CARE_PROVIDER_SITE_OTHER): Payer: Self-pay | Admitting: Vascular Surgery

## 2016-04-10 ENCOUNTER — Ambulatory Visit (INDEPENDENT_AMBULATORY_CARE_PROVIDER_SITE_OTHER): Payer: Managed Care, Other (non HMO)

## 2016-04-10 ENCOUNTER — Ambulatory Visit (INDEPENDENT_AMBULATORY_CARE_PROVIDER_SITE_OTHER): Payer: Managed Care, Other (non HMO) | Admitting: Vascular Surgery

## 2016-04-10 VITALS — BP 127/80 | HR 72 | Resp 14 | Wt 197.0 lb

## 2016-04-10 DIAGNOSIS — I739 Peripheral vascular disease, unspecified: Secondary | ICD-10-CM

## 2016-04-10 DIAGNOSIS — I63232 Cerebral infarction due to unspecified occlusion or stenosis of left carotid arteries: Secondary | ICD-10-CM | POA: Diagnosis not present

## 2016-04-10 DIAGNOSIS — I6523 Occlusion and stenosis of bilateral carotid arteries: Secondary | ICD-10-CM | POA: Diagnosis not present

## 2016-04-10 DIAGNOSIS — E785 Hyperlipidemia, unspecified: Secondary | ICD-10-CM | POA: Diagnosis not present

## 2016-04-10 DIAGNOSIS — Z716 Tobacco abuse counseling: Secondary | ICD-10-CM

## 2016-04-10 LAB — VAS US CAROTID
LEFT ECA DIAS: 47 cm/s
LEFT VERTEBRAL DIAS: 34 cm/s
LICADDIAS: -28 cm/s
LICAPSYS: -78 cm/s
Left ICA dist sys: -69 cm/s
Left ICA prox dias: -37 cm/s
RCCADSYS: -88 cm/s
RCCAPDIAS: 40 cm/s
RCCAPSYS: 150 cm/s
RIGHT CCA MID DIAS: 45 cm/s
RIGHT ECA DIAS: -40 cm/s

## 2016-04-10 NOTE — Progress Notes (Signed)
Subjective:    Patient ID: Gary Mccoy., male    DOB: 1950/08/31, 66 y.o.   MRN: [id number] Chief Complaint  Patient presents with  . Follow-up   Patient presents for a non-invasive study follow up for carotid stenosis and PAD. Patient last seen approximately three months ago. The stenosis has been followed by surveillance duplexes. The patient underwent a bilateral carotid duplex scan which showed no change from the previous exam in 12/2015. Duplex is stable at Right ICA stenosis (40-59%) and known Left ICA occlusion. The patient denies experiencing Amaurosis Fugax, TIA like symptoms or focal motor deficits. The patient is s/p an aorto-bifemoal bypass on 11/08/15. The patient underwent an ABI which showed Right ABI: 1.01 and Left 1.00 (on 01/01/16, Right ABI: 0.96 and Left 1.03). An aortoiliac arterial duplex is notable for patent bypasses with biphasic blood through, no significant hemodynamic stenosis noted. The patient denies any claudication like symptoms, rest pain or ulcers to his feet / toes.    Review of Systems  Constitutional: Negative.   HENT: Negative.   Eyes: Negative.   Respiratory: Negative.   Cardiovascular: Negative.   Gastrointestinal: Negative.   Endocrine: Negative.   Genitourinary: Negative.   Musculoskeletal: Negative.   Skin: Negative.   Allergic/Immunologic: Negative.   Neurological: Negative.   Hematological: Negative.   Psychiatric/Behavioral: Negative.       Objective:   Physical Exam  Constitutional: He is oriented to person, place, and time. He appears well-developed and well-nourished.  HENT:  Head: Normocephalic and atraumatic.  Right Ear: External ear normal.  Left Ear: External ear normal.  Eyes: Conjunctivae and EOM are normal. Pupils are equal, round, and reactive to light.  Neck: Normal range of motion.  Cardiovascular: Normal rate, regular rhythm, normal heart sounds and intact distal pulses.   Pulses:      Radial pulses are 2+ on  the right side, and 2+ on the left side.       Dorsalis pedis pulses are 1+ on the right side, and 1+ on the left side.       Posterior tibial pulses are 2+ on the right side, and 2+ on the left side.  Pulmonary/Chest: Effort normal and breath sounds normal.  Abdominal: Soft. Bowel sounds are normal.  Musculoskeletal: Normal range of motion. He exhibits no edema.  Neurological: He is alert and oriented to person, place, and time.  Skin: Skin is warm and dry.  Psychiatric: He has a normal mood and affect. His behavior is normal. Judgment and thought content normal.   BP 127/80   Pulse 72   Resp 14   Wt 197 lb (89.4 kg)   BMI 25.99 kg/m   Past Medical History:  Diagnosis Date  . Arthritis    lower back  . Back pain   . Duodenal ulcer   . Elevated lipids   . GERD (gastroesophageal reflux disease)   . Leg weakness, bilateral    s/p lumbar surgery  . PAD (peripheral artery disease) ([place])   . Stroke ([place])    Rt lower leg  estimated 10 - 12 stokes in last 5 years  . Wears dentures    full upper and lower   Social History   Social History  . Marital status: Married    Spouse name: N/A  . Number of children: N/A  . Years of education: N/A   Occupational History  . Not on file.   Social History Main Topics  . Smoking status: Current  Every Day Smoker    Packs/day: 1.00    Years: 45.00    Types: Cigarettes  . Smokeless tobacco: Never Used  . Alcohol use Yes     Comment: rare 3 a year  . Drug use: No  . Sexual activity: Not on file   Other Topics Concern  . Not on file   Social History Narrative  . No narrative on file   Past Surgical History:  Procedure Laterality Date  . AORTA - BILATERAL FEMORAL ARTERY BYPASS GRAFT N/A 11/08/2015   Procedure: AORTA BIFEMORAL BYPASS GRAFT;  Surgeon: Katha Cabal, MD;  Location: [facility];  Service: Vascular;  Laterality: N/A;  . BACK SURGERY  2005  . [place]  . ENDARTERECTOMY FEMORAL Bilateral 11/08/2015     Procedure: ENDARTERECTOMY FEMORAL;  Surgeon: Katha Cabal, MD;  Location: [facility];  Service: Vascular;  Laterality: Bilateral;  common, SFA, and profundis  Aortic endarterectomy   . ESOPHAGOGASTRODUODENOSCOPY (EGD) WITH PROPOFOL N/A 09/04/2015   Procedure: ESOPHAGOGASTRODUODENOSCOPY (EGD) WITH PROPOFOL;  Surgeon: Lucilla Lame, MD;  Location: [place];  Service: Endoscopy;  Laterality: N/A;  . TEE WITHOUT CARDIOVERSION N/A 09/22/2015   Procedure: TRANSESOPHAGEAL ECHOCARDIOGRAM (TEE);  Surgeon: Teodoro Spray, MD;  Location: [facility];  Service: Cardiovascular;  Laterality: N/A;   Family History  Problem Relation Age of Onset  . Heart attack Mother   . Hypertension Mother   . Varicose Veins Mother   . Diabetes Father   . Heart attack Father   . Heart attack Maternal Grandmother   . Stroke Maternal Grandmother    No Active Allergies     Assessment & Plan:  Patient presents for a non-invasive study follow up for carotid stenosis and PAD. Patient last seen approximately three months ago. The stenosis has been followed by surveillance duplexes. The patient underwent a bilateral carotid duplex scan which showed no change from the previous exam in 12/2015. Duplex is stable at Right ICA stenosis (40-59%) and known Left ICA occlusion. The patient denies experiencing Amaurosis Fugax, TIA like symptoms or focal motor deficits. The patient is s/p an aorto-bifemoal bypass on 11/08/15. The patient underwent an ABI which showed Right ABI: 1.01 and Left 1.00 (on 01/01/16, Right ABI: 0.96 and Left 1.03). An aortoiliac arterial duplex is notable for patent bypasses with biphasic blood through, no significant hemodynamic stenosis noted. The patient denies any claudication like symptoms, rest pain or ulcers to his feet / toes.   1. Peripheral vascular disease ([place]) - Stable Studies reviewed with patient. Patient is asymptomatic with stable studies Patient to follow up in six months. Patient to  continue medical optimization with ASA and dyslipidemia medication. I have discussed with the patient at length the risk factors for and pathogenesis of atherosclerotic disease and encouraged a healthy diet, regular exercise regimen and blood pressure / glucose control.  The patient was encouraged to call the office in the interim if he experiences any claudication like symptoms, rest pain or ulcers to his feet / toes.  - VAS [place] ABI WITH/WO TBI; Future - VAS US AORTA/IVC/ILIACS; Future  2. Bilateral carotid artery stenosis - Stable Studies reviewed with patient. Patient asymptomatic with stable duplex.  No intervention at this time.  Patient to return in one year for surveillance carotid duplex. Patient to continue medical optimization with ASA and dyslipidemia medication. I have discussed with the patient at length the risk factors for and pathogenesis of atherosclerotic disease and encouraged a healthy diet,  regular exercise regimen and blood pressure / glucose control.  Patient was instructed to contact our office in the interim with problems such as arm / leg weakness or numbness, speech / swallowing difficulty or temporary monocular blindness. The patient expresses their understanding.   - VAS US CAROTID; Future  3. Tobacco abuse counseling - Stable I have discussed (approximately 5 minutes) with the patient the role of tobacco in the pathogenesis of atherosclerosis and its effect on the progression of the disease, impact on the durability of interventions and its limitations on the formation of collateral pathways. I have recommended absolute tobacco cessation. I have discussed various options available for assistance with tobacco cessation including over the counter methods (Nicotine gum, patch and lozenges). We also discussed prescription options (Chantix, Nicotine Inhaler / Nasal Spray). The patient is not interested in pursuing any prescription tobacco cessation options at this time. The  patient voices their understanding.   4. Hyperlipidemia, unspecified hyperlipidemia type - Stable Encouraged good control as its slows the progression of atherosclerotic disease  Current Outpatient Prescriptions on File Prior to Visit  Medication Sig Dispense Refill  . aspirin 81 MG chewable tablet Chew 1 tablet (81 mg total) by mouth daily.    Marland Kitchen atorvastatin (LIPITOR) 40 MG tablet Take 1 tablet (40 mg total) by mouth daily at 6 PM. 30 tablet 1  . celecoxib (CELEBREX) 200 MG capsule Take 200 mg by mouth daily.  11  . Multiple Vitamins-Minerals (MULTIVITAMIN ADULT PO) Take 1 tablet by mouth daily.     . nicotine (NICODERM CQ - DOSED IN MG/24 HOURS) 21 mg/24hr patch Place onto the skin.    Marland Kitchen omeprazole (PRILOSEC) 20 MG capsule Take 20 mg by mouth as needed.    . rivaroxaban (XARELTO) 20 MG TABS tablet Take 1 tablet (20 mg total) by mouth daily. 30 tablet 1  . VENTOLIN HFA 108 (90 Base) MCG/ACT inhaler Take 1-2 puffs by mouth 3 (three) times daily as needed. For congestion, shortness of breath, and/or wheezing.  0  . metoprolol succinate (TOPROL-XL) 25 MG 24 hr tablet Take 0.5 tablets (12.5 mg total) by mouth daily. (Patient not taking: Reported on 04/10/2016) 30 tablet 1   No current facility-administered medications on file prior to visit.     There are no Patient Instructions on file for this visit. No Follow-up on file.   Lilana Blasko A Elyjah Hazan, PA-C

## 2016-10-09 ENCOUNTER — Ambulatory Visit (INDEPENDENT_AMBULATORY_CARE_PROVIDER_SITE_OTHER): Payer: Managed Care, Other (non HMO)

## 2016-10-09 ENCOUNTER — Ambulatory Visit (INDEPENDENT_AMBULATORY_CARE_PROVIDER_SITE_OTHER): Payer: Managed Care, Other (non HMO) | Admitting: Vascular Surgery

## 2016-10-09 DIAGNOSIS — I739 Peripheral vascular disease, unspecified: Secondary | ICD-10-CM

## 2016-10-15 ENCOUNTER — Encounter (INDEPENDENT_AMBULATORY_CARE_PROVIDER_SITE_OTHER): Payer: Self-pay | Admitting: Vascular Surgery

## 2016-10-29 DIAGNOSIS — D369 Benign neoplasm, unspecified site: Secondary | ICD-10-CM | POA: Insufficient documentation

## 2017-02-18 ENCOUNTER — Telehealth (INDEPENDENT_AMBULATORY_CARE_PROVIDER_SITE_OTHER): Payer: Self-pay | Admitting: Vascular Surgery

## 2017-02-18 NOTE — Telephone Encounter (Signed)
Patient needs a letter stating that he is cleared for a DOT physical. His current physical expires at the end of this week. Please call him when ready or with instruction

## 2017-03-06 ENCOUNTER — Encounter (INDEPENDENT_AMBULATORY_CARE_PROVIDER_SITE_OTHER): Payer: Self-pay | Admitting: Vascular Surgery

## 2017-03-06 NOTE — Telephone Encounter (Signed)
Patient came back again to check status. Letter prepared. LMVM for patient to come pick up

## 2017-04-11 ENCOUNTER — Ambulatory Visit (INDEPENDENT_AMBULATORY_CARE_PROVIDER_SITE_OTHER): Payer: Managed Care, Other (non HMO) | Admitting: Vascular Surgery

## 2017-04-11 ENCOUNTER — Ambulatory Visit (INDEPENDENT_AMBULATORY_CARE_PROVIDER_SITE_OTHER): Payer: Managed Care, Other (non HMO)

## 2017-04-11 ENCOUNTER — Encounter (INDEPENDENT_AMBULATORY_CARE_PROVIDER_SITE_OTHER): Payer: Self-pay | Admitting: Vascular Surgery

## 2017-04-11 VITALS — BP 127/73 | HR 62 | Resp 17 | Wt 193.8 lb

## 2017-04-11 DIAGNOSIS — I6523 Occlusion and stenosis of bilateral carotid arteries: Secondary | ICD-10-CM | POA: Diagnosis not present

## 2017-04-11 DIAGNOSIS — E785 Hyperlipidemia, unspecified: Secondary | ICD-10-CM | POA: Diagnosis not present

## 2017-04-11 DIAGNOSIS — I739 Peripheral vascular disease, unspecified: Secondary | ICD-10-CM

## 2017-04-11 NOTE — Progress Notes (Signed)
Subjective:    Patient ID: Gary Mccoy., male    DOB: October 08, 1950, 67 y.o.   MRN: [id number] Chief Complaint  Patient presents with  . Carotid    38yrfollow up   Patient presents for a yearly non-invasive study follow up for carotid stenosis. The stenosis has been followed by surveillance duplexes. The patient underwent a bilateral carotid duplex scan which showed no change from the previous exam on 04-14-16. Right ICA stenosis (40-59%) and known left carotid artery occlusion. The patient denies experiencing Amaurosis Fugax, TIA like symptoms or focal motor deficits.  The patient denies any claudication-like symptoms, rest pain or ulceration to the lower extremity.  The patient denies any fever, nausea vomiting.   Review of Systems  Constitutional: Negative.   HENT: Negative.   Eyes: Negative.   Respiratory: Negative.   Cardiovascular: Negative.   Gastrointestinal: Negative.   Endocrine: Negative.   Genitourinary: Negative.   Musculoskeletal: Negative.   Skin: Negative.   Allergic/Immunologic: Negative.   Neurological: Negative.   Hematological: Negative.   Psychiatric/Behavioral: Negative.       Objective:   Physical Exam  Constitutional: He is oriented to person, place, and time. He appears well-developed and well-nourished. No distress.  HENT:  Head: Normocephalic and atraumatic.  Eyes: Conjunctivae are normal. Pupils are equal, round, and reactive to light.  Neck: Normal range of motion.  No carotid bruits noted on exam  Cardiovascular: Normal rate, regular rhythm, normal heart sounds and intact distal pulses.  Pulses:      Radial pulses are 2+ on the right side, and 2+ on the left side.       Dorsalis pedis pulses are 1+ on the right side, and 1+ on the left side.       Posterior tibial pulses are 2+ on the right side, and 2+ on the left side.  Pulmonary/Chest: Effort normal and breath sounds normal.  Musculoskeletal: Normal range of motion. He exhibits no  edema.  Neurological: He is alert and oriented to person, place, and time.  Skin: Skin is warm and dry. He is not diaphoretic.  Psychiatric: He has a normal mood and affect. His behavior is normal. Judgment and thought content normal.  Vitals reviewed.  BP 127/73 (BP Location: Right Arm)   Pulse 62   Resp 17   Wt 193 lb 12.8 oz (87.9 kg)   BMI 25.57 kg/m   Past Medical History:  Diagnosis Date  . Arthritis    lower back  . Back pain   . Duodenal ulcer   . Elevated lipids   . GERD (gastroesophageal reflux disease)   . Leg weakness, bilateral    s/p lumbar surgery  . [place]   . [place]    Rt lower leg  estimated 10 - 12 stokes in last 5 years  . Wears dentures    full upper and lower   Social History   Socioeconomic History  . Marital status: Married    Spouse name: Not on file  . Number of children: Not on file  . Years of education: Not on file  . Highest education level: Not on file  Social Needs  . Financial resource strain: Not on file  . Food insecurity - worry: Not on file  . Food insecurity - inability: Not on file  . Transportation needs - medical: Not on file  . Transportation needs - non-medical: Not on file  Occupational History  . Not on  file  Tobacco Use  . Smoking status: Current Every Day Smoker    Packs/day: 1.00    Years: 45.00    Pack years: 45.00    Types: Cigarettes  . Smokeless tobacco: Never Used  Substance and Sexual Activity  . Alcohol use: Yes    Comment: rare 3 a year  . Drug use: No  . Sexual activity: Not on file  Other Topics Concern  . Not on file  Social History Narrative  . Not on file   Past Surgical History:  Procedure Laterality Date  . AORTA - BILATERAL FEMORAL ARTERY BYPASS GRAFT N/A 11/08/2015   Procedure: AORTA BIFEMORAL BYPASS GRAFT;  Surgeon: Katha Cabal, MD;  Location: [facility];  Service: Vascular;  Laterality: N/A;  . BACK SURGERY  2005  . [place]    . ENDARTERECTOMY FEMORAL Bilateral 11/08/2015   Procedure: ENDARTERECTOMY FEMORAL;  Surgeon: Katha Cabal, MD;  Location: [facility];  Service: Vascular;  Laterality: Bilateral;  common, SFA, and profundis  Aortic endarterectomy   . ESOPHAGOGASTRODUODENOSCOPY (EGD) WITH PROPOFOL N/A 09/04/2015   Procedure: ESOPHAGOGASTRODUODENOSCOPY (EGD) WITH PROPOFOL;  Surgeon: Lucilla Lame, MD;  Location: [place];  Service: Endoscopy;  Laterality: N/A;  . TEE WITHOUT CARDIOVERSION N/A 09/22/2015   Procedure: TRANSESOPHAGEAL ECHOCARDIOGRAM (TEE);  Surgeon: Teodoro Spray, MD;  Location: [facility];  Service: Cardiovascular;  Laterality: N/A;   Family History  Problem Relation Age of Onset  . Heart attack Mother   . Hypertension Mother   . Varicose Veins Mother   . Diabetes Father   . Heart attack Father   . Heart attack Maternal Grandmother   . Stroke Maternal Grandmother    No Known Allergies     Assessment & Plan:  Patient presents for a yearly non-invasive study follow up for carotid stenosis. The stenosis has been followed by surveillance duplexes. The patient underwent a bilateral carotid duplex scan which showed no change from the previous exam on 04-14-16. Right ICA stenosis (40-59%) and known left carotid artery occlusion. The patient denies experiencing Amaurosis Fugax, TIA like symptoms or focal motor deficits.  The patient denies any claudication-like symptoms, rest pain or ulceration to the lower extremity.  The patient denies any fever, nausea vomiting.  1. Peripheral vascular disease ([place]) - Stable The patient has a history of peripheral artery disease requiring endovascular intervention This is followed on a yearly basis The patient presents today asymptomatically Unremarkable physical exam Indication for intervention at this time  2. Bilateral carotid artery stenosis - Stable Studies reviewed with patient. Patient asymptomatic with stable duplex.  No intervention at this  time. Patient to return in one year for surveillance carotid duplex. Patient to continue medical optimization with ASA and dyslipidemia medication. Patient to remain abstinent of tobacco use. I have discussed with the patient at length the risk factors for and pathogenesis of atherosclerotic disease and encouraged a healthy diet, regular exercise regimen and blood pressure / glucose control.  Patient was instructed to contact our office in the interim with problems such as arm / leg weakness or numbness, speech / swallowing difficulty or temporary monocular blindness. The patient expresses their understanding.  - VAS US CAROTID; Future  3. Hyperlipidemia, unspecified hyperlipidemia type - Stable Encouraged good control as its slows the progression of atherosclerotic disease  Current Outpatient Medications on File Prior to Visit  Medication Sig Dispense Refill  . aspirin 81 MG chewable tablet Chew 1 tablet (81 mg total) by mouth  daily.    . atorvastatin (LIPITOR) 40 MG tablet Take 1 tablet (40 mg total) by mouth daily at 6 PM. 30 tablet 1  . Multiple Vitamins-Minerals (MULTIVITAMIN ADULT PO) Take 1 tablet by mouth daily.     . rivaroxaban (XARELTO) 20 MG TABS tablet Take 1 tablet (20 mg total) by mouth daily. 30 tablet 1  . VENTOLIN HFA 108 (90 Base) MCG/ACT inhaler Take 1-2 puffs by mouth 3 (three) times daily as needed. For congestion, shortness of breath, and/or wheezing.  0  . celecoxib (CELEBREX) 200 MG capsule Take 200 mg by mouth daily.  11  . metoprolol succinate (TOPROL-XL) 25 MG 24 hr tablet Take 0.5 tablets (12.5 mg total) by mouth daily. (Patient not taking: Reported on 04/10/2016) 30 tablet 1  . nicotine (NICODERM CQ - DOSED IN MG/24 HOURS) 21 mg/24hr patch Place onto the skin.    Marland Kitchen omeprazole (PRILOSEC) 20 MG capsule Take 20 mg by mouth as needed.     No current facility-administered medications on file prior to visit.    There are no Patient Instructions on file for this  visit. No Follow-up on file.  KIMBERLY A STEGMAYER, PA-C

## 2017-05-07 IMAGING — CT CT ANGIO NECK
2 of 7 series · 9 of 33 positions shown · IV contrast (APPLIED)
Comparison: Carotid Doppler 09/21/2015

CLINICAL DATA: Left carotid occlusion



[Series 4: cta neck · axial · 0.46mm/px · z∈[-360,-180]mm · 5 of 270 slices shown]
[im 45/270  soft-tissue]
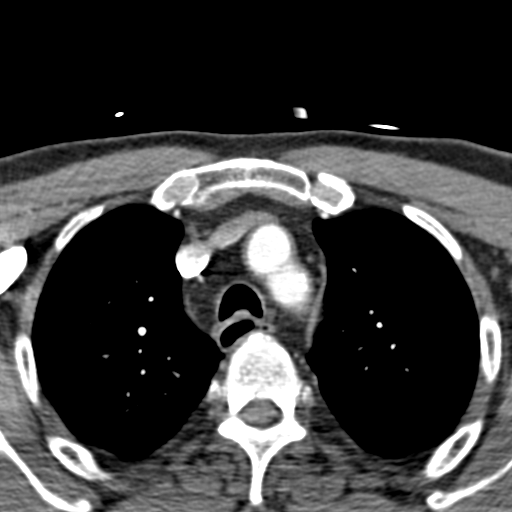
[im 90/270  bone]
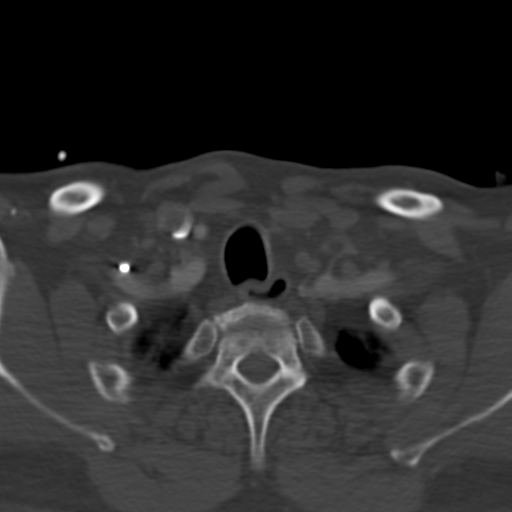
[im 135/270  soft-tissue]
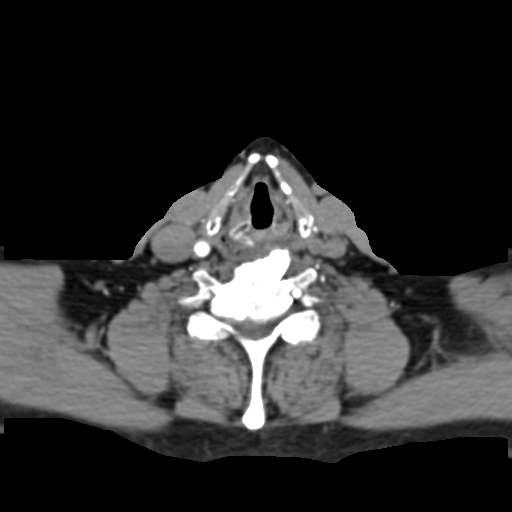
[im 180/270  bone]
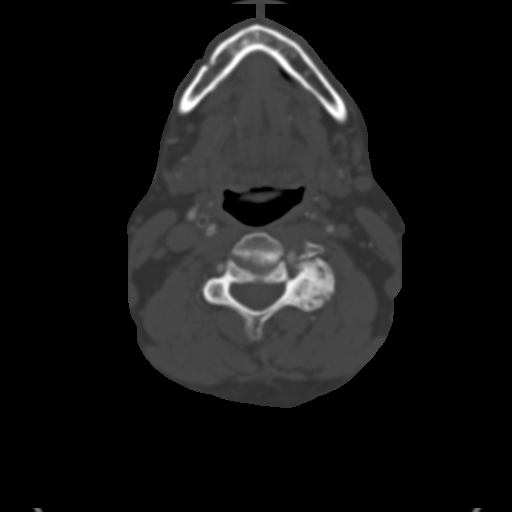
[im 225/270  soft-tissue]
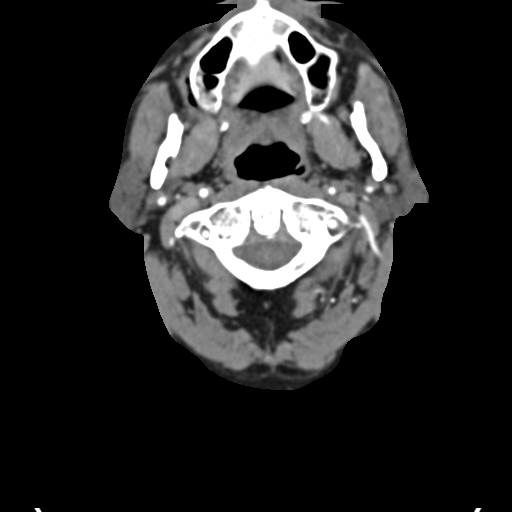

[Series 6: ax thin · axial · 0.46mm/px · z∈[-350,-190]mm · 4 of 268 slices shown]
[im 54/268  soft-tissue]
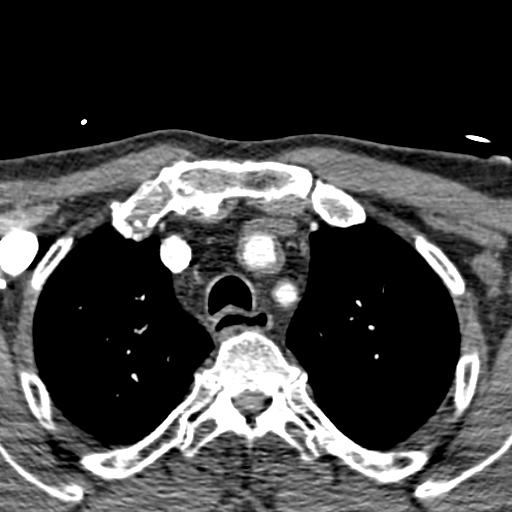
[im 107/268  soft-tissue]
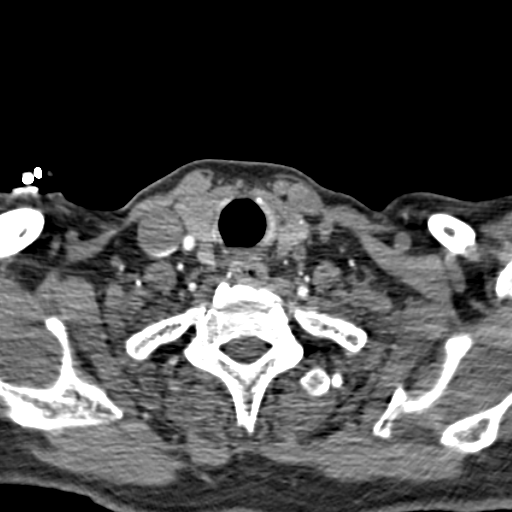
[im 161/268  soft-tissue]
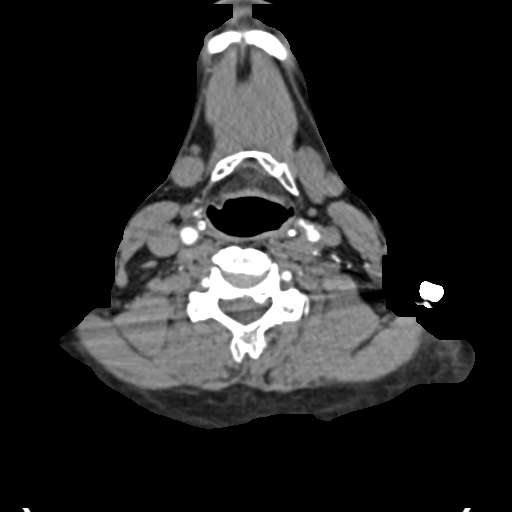
[im 214/268  soft-tissue]
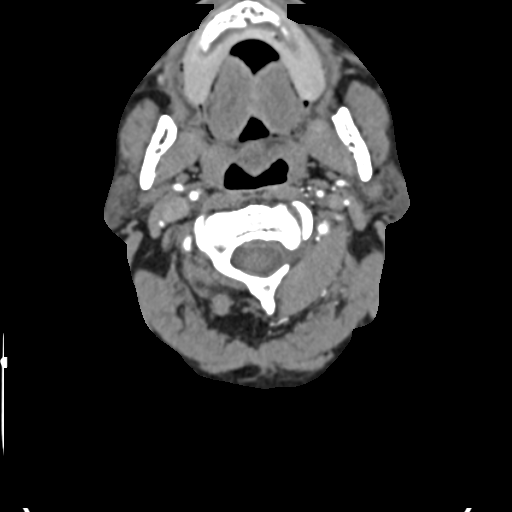

[9 of 33 positions shown; findings below may reference images not displayed]

FINDINGS: Aortic arch: Mild atherosclerotic calcification in the aortic arch.
No aneurysm or dissection. Lung apices clear.

Right carotid system: Mild atherosclerotic disease in the innominate
artery. Right common carotid artery is patent with atherosclerotic
calcification narrowing the lumen by approximately 50% diameter
stenosis. Right carotid bifurcation widely patent. Calcified plaque
just above the bifurcation with 50% diameter stenosis proximal right
internal carotid artery. Right external carotid artery widely
patent.

Left carotid system: Left common carotid artery occluded at the
origin. Reconstitution of the left internal carotid artery via
external carotid artery collaterals. Small caliber left internal
carotid artery which is patent to the skullbase.

Vertebral arteries:Moderate stenosis origin left vertebral artery.
Left vertebral artery is dominant. Mild atherosclerotic disease in
the left vertebral artery which is patent to the basilar. Mild
stenosis origin right vertebral artery which is patent to the
basilar without significant additional stenosis.

Skeleton: Disc degeneration and spondylosis in the cervical spine.
No fracture or mass lesion.

Other neck: Negative for mass or adenopathy in the neck.
IMPRESSION: 50% diameter stenosis right common carotid artery. 50% diameter
stenosis proximal right internal carotid artery above the
bifurcation

Occluded left common carotid artery at the origin. Reconstitution of
left internal carotid artery via external carotid artery
collaterals. Left internal carotid artery is patent to the skullbase

Moderate stenosis origin left vertebral artery and mild stenosis
origin right vertebral artery.

## 2017-06-08 IMAGING — CR DG CHEST 2V
1 series · 2 of 2 positions shown · non-contrast
Comparison: No prior.

CLINICAL DATA: Cardiac surgery.

EXAM:
CHEST  2 VIEW

[Series 1: dg chest 2 view · 0.14mm/px · 2 of 2 slices shown]
[im 1/2]
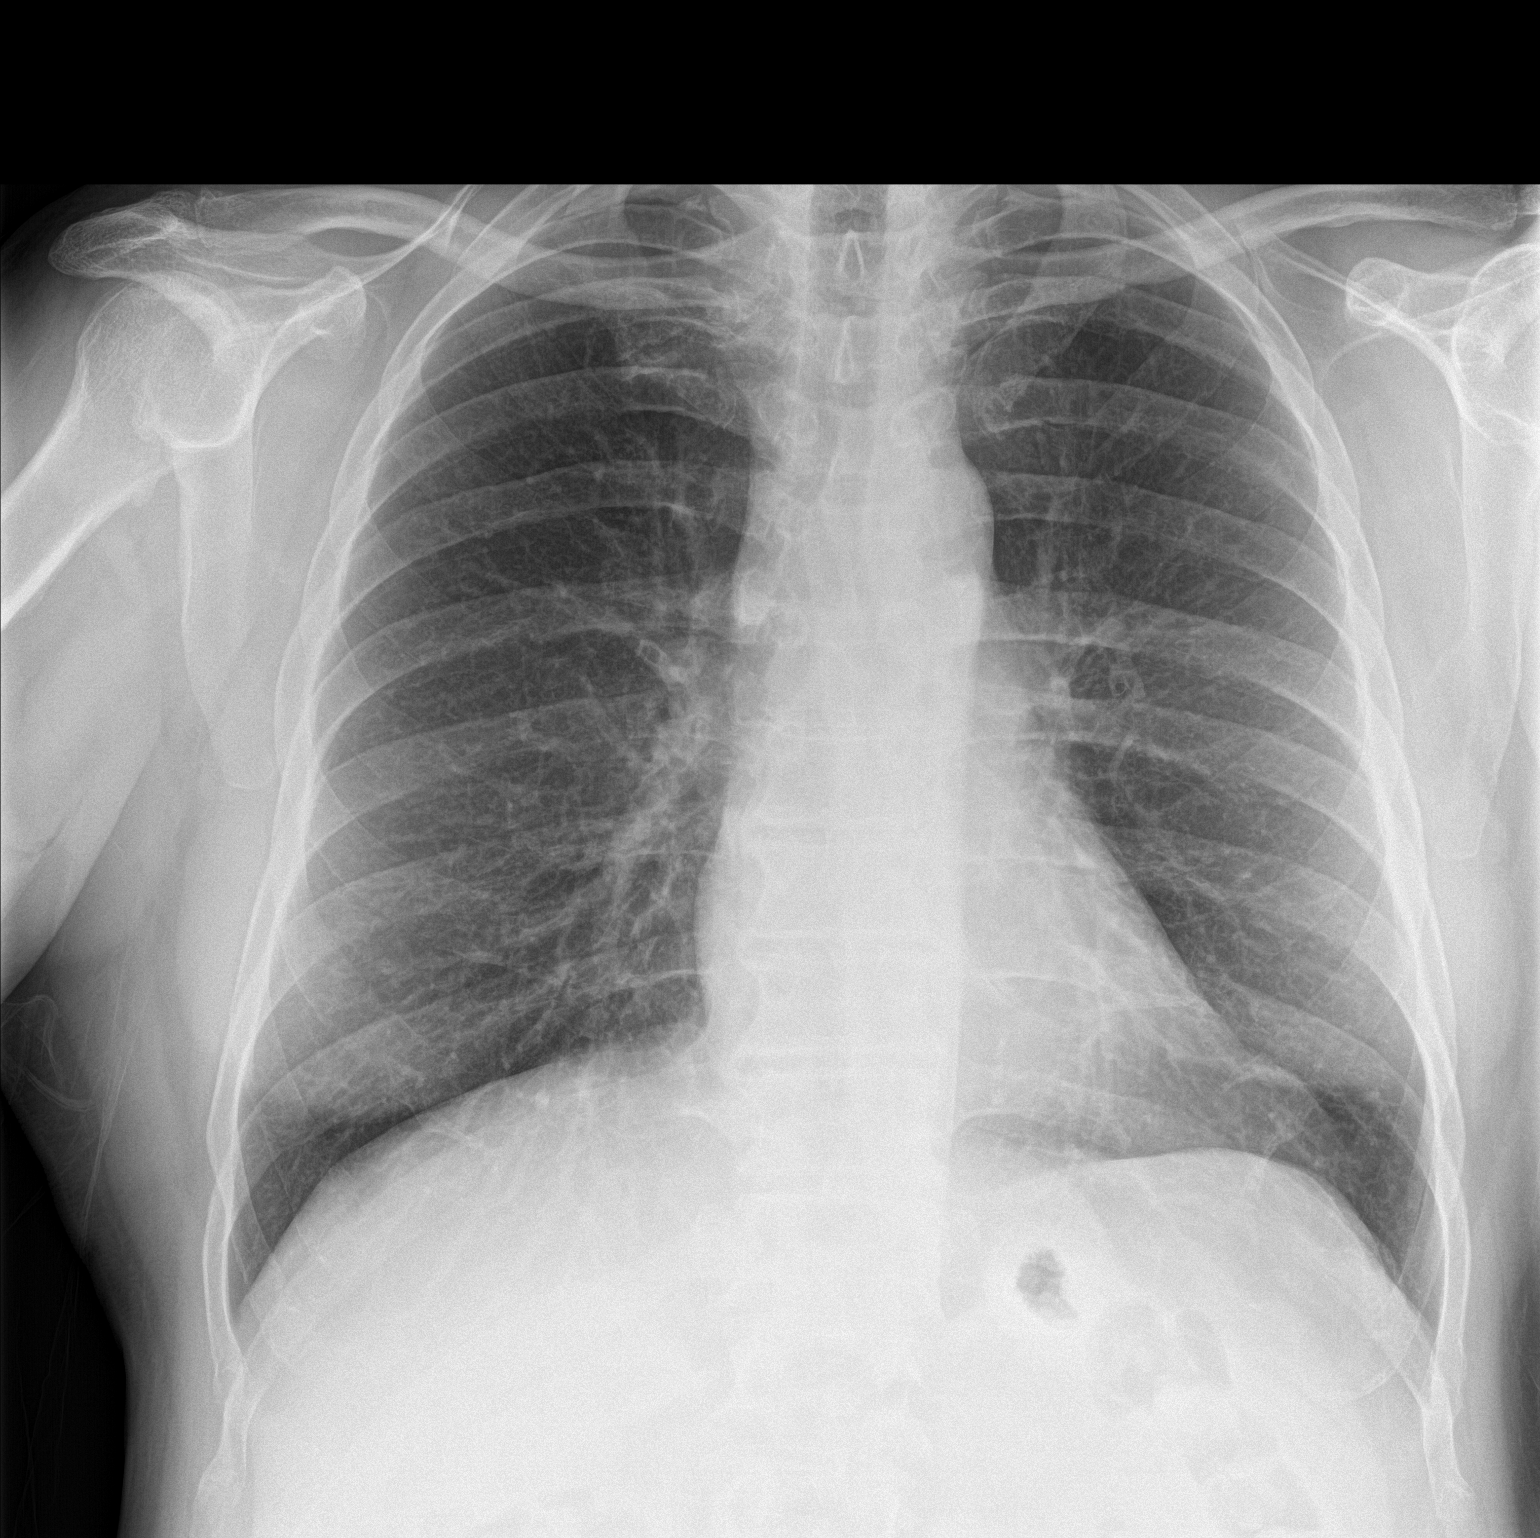
[im 2/2]
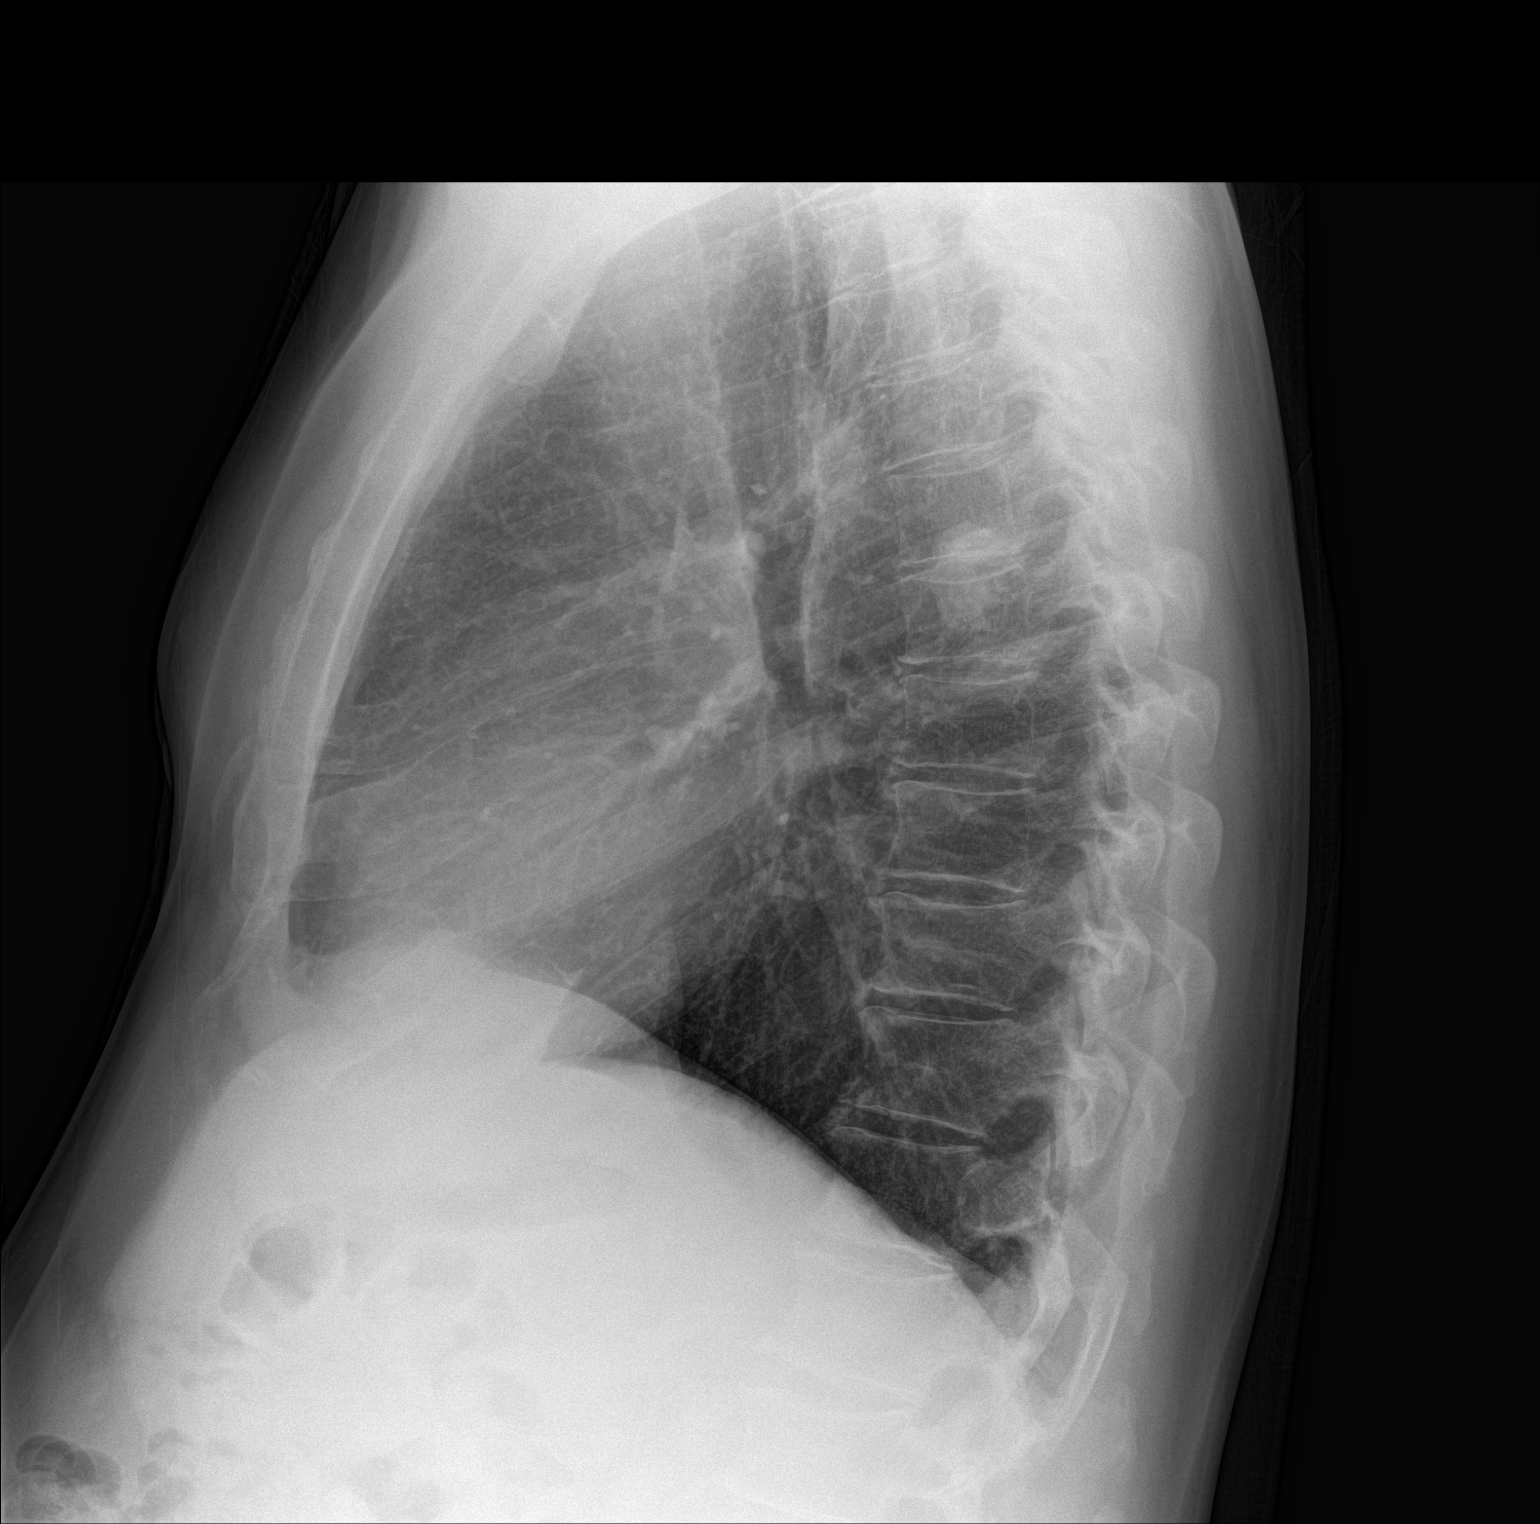

[2 of 2 positions shown; findings below may reference images not displayed]

FINDINGS: Mediastinum and hilar structures normal. Heart size normal. No focal
infiltrate. No pleural effusion or pneumothorax. Multilevel
degenerative change.
IMPRESSION: No acute cardiopulmonary disease.

## 2017-06-25 IMAGING — DX DG ABDOMEN 1V
1 series · 1 of 1 positions shown · non-contrast
Comparison: 11/09/2015 and earlier.

CLINICAL DATA: 65-year-old male status post abdominal surgery,
aortic by femoral bypass. Initial encounter.

EXAM:
ABDOMEN - 1 VIEW

[abdomen kub]
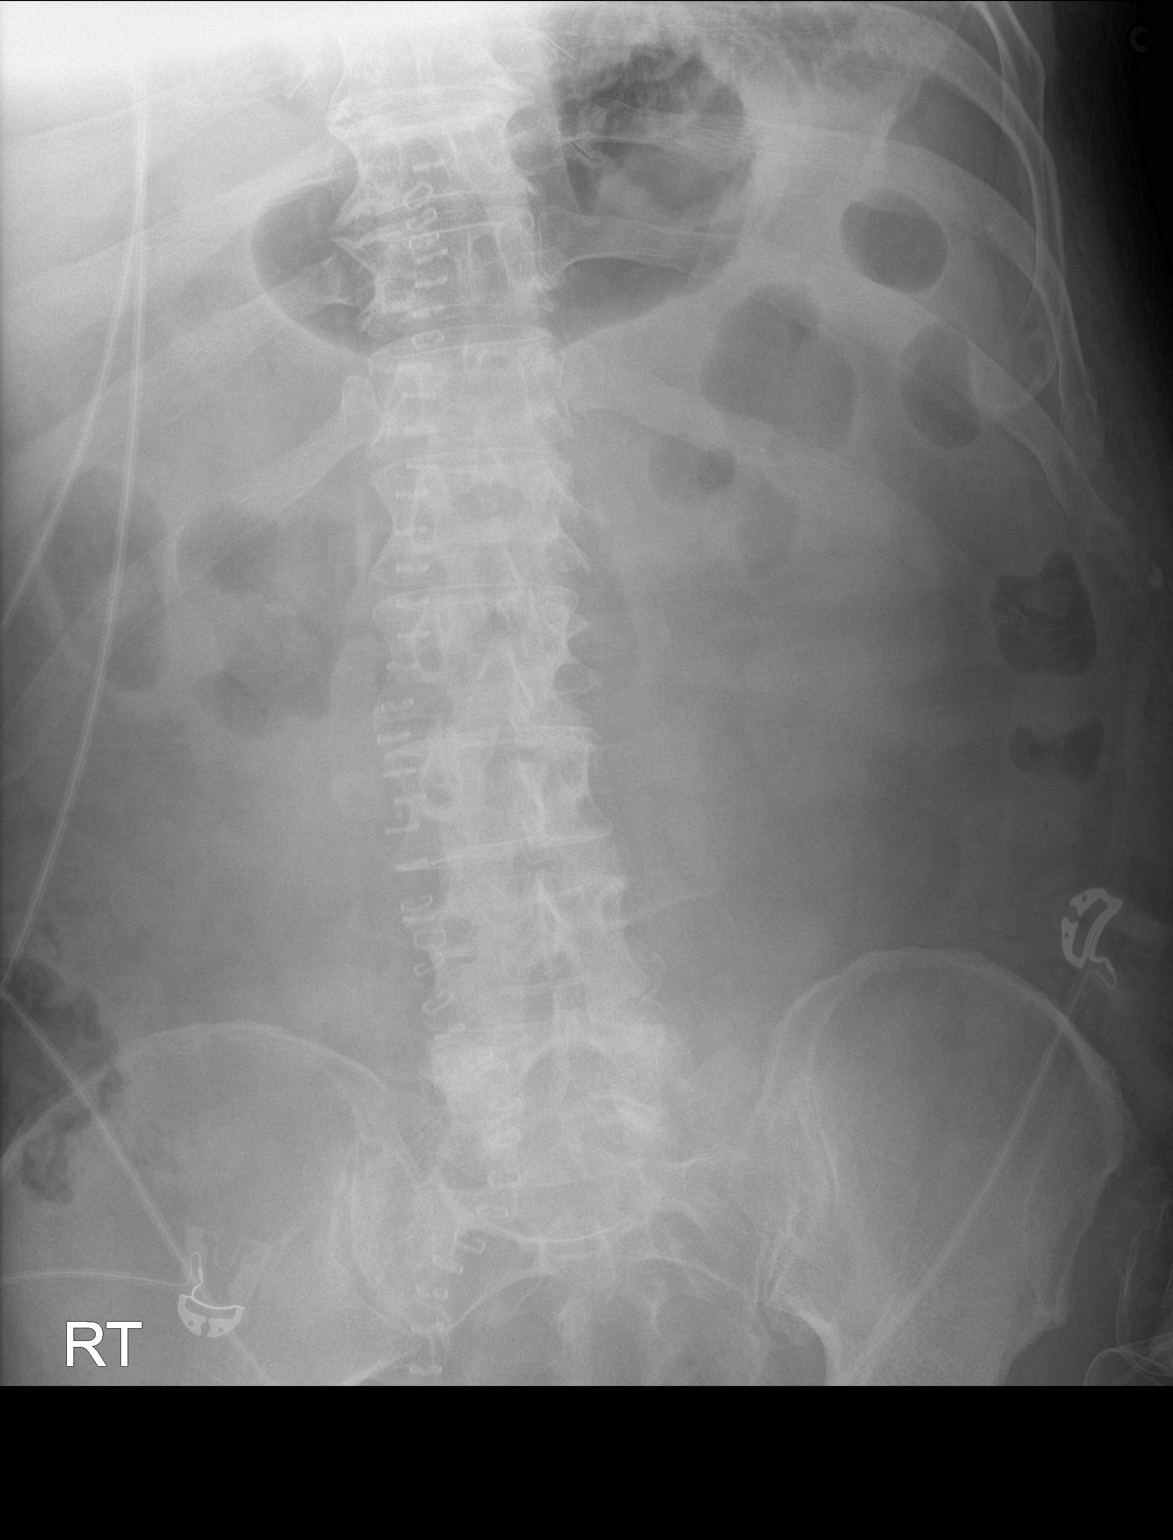

[1 of 1 positions shown; findings below may reference images not displayed]

FINDINGS: Supine view at 1013 hours. Midline skin staples remain in place.
Bowel gas pattern remains non obstructed. Slightly more gas in the
stomach today. Stable visualized osseous structures. No acute
findings identified.
IMPRESSION: Bowel gas pattern remains non obstructed. Stable and negative
postoperative supine appearance of the abdomen.

## 2017-10-06 ENCOUNTER — Ambulatory Visit (INDEPENDENT_AMBULATORY_CARE_PROVIDER_SITE_OTHER): Payer: Managed Care, Other (non HMO) | Admitting: Vascular Surgery

## 2017-10-06 ENCOUNTER — Encounter (INDEPENDENT_AMBULATORY_CARE_PROVIDER_SITE_OTHER): Payer: Self-pay

## 2017-10-06 ENCOUNTER — Encounter (INDEPENDENT_AMBULATORY_CARE_PROVIDER_SITE_OTHER): Payer: Managed Care, Other (non HMO)

## 2017-12-29 ENCOUNTER — Encounter: Payer: Self-pay | Admitting: *Deleted

## 2017-12-30 ENCOUNTER — Encounter
Admission: RE | Disposition: A | Discharge status: Home / Self Care | Payer: Self-pay | Source: Ambulatory Visit | Attending: Internal Medicine

## 2017-12-30 ENCOUNTER — Ambulatory Visit: Payer: Managed Care, Other (non HMO) | Admitting: Anesthesiology

## 2017-12-30 ENCOUNTER — Ambulatory Visit
Admission: RE | Admit: 2017-12-30 | Discharge: 2017-12-30 | Disposition: A | Discharge status: Home / Self Care | Payer: Managed Care, Other (non HMO) | Source: Ambulatory Visit | Attending: Internal Medicine | Admitting: Internal Medicine

## 2017-12-30 ENCOUNTER — Encounter: Payer: Self-pay | Admitting: *Deleted

## 2017-12-30 DIAGNOSIS — M479 Spondylosis, unspecified: Secondary | ICD-10-CM | POA: Insufficient documentation

## 2017-12-30 DIAGNOSIS — Z7982 Long term (current) use of aspirin: Secondary | ICD-10-CM | POA: Insufficient documentation

## 2017-12-30 DIAGNOSIS — K219 Gastro-esophageal reflux disease without esophagitis: Secondary | ICD-10-CM | POA: Diagnosis not present

## 2017-12-30 DIAGNOSIS — Z1211 Encounter for screening for malignant neoplasm of colon: Secondary | ICD-10-CM | POA: Insufficient documentation

## 2017-12-30 DIAGNOSIS — D123 Benign neoplasm of transverse colon: Secondary | ICD-10-CM | POA: Diagnosis not present

## 2017-12-30 DIAGNOSIS — K640 First degree hemorrhoids: Secondary | ICD-10-CM | POA: Insufficient documentation

## 2017-12-30 DIAGNOSIS — Z8673 Personal history of transient ischemic attack (TIA), and cerebral infarction without residual deficits: Secondary | ICD-10-CM | POA: Insufficient documentation

## 2017-12-30 DIAGNOSIS — Z79899 Other long term (current) drug therapy: Secondary | ICD-10-CM | POA: Insufficient documentation

## 2017-12-30 DIAGNOSIS — K5730 Diverticulosis of large intestine without perforation or abscess without bleeding: Secondary | ICD-10-CM | POA: Diagnosis not present

## 2017-12-30 DIAGNOSIS — I669 Occlusion and stenosis of unspecified cerebral artery: Secondary | ICD-10-CM | POA: Diagnosis not present

## 2017-12-30 DIAGNOSIS — Z7901 Long term (current) use of anticoagulants: Secondary | ICD-10-CM | POA: Insufficient documentation

## 2017-12-30 DIAGNOSIS — I739 Peripheral vascular disease, unspecified: Secondary | ICD-10-CM | POA: Diagnosis not present

## 2017-12-30 HISTORY — DX: Other intervertebral disc degeneration, lumbar region without mention of lumbar back pain or lower extremity pain: M51.369

## 2017-12-30 HISTORY — PX: COLONOSCOPY WITH PROPOFOL: SHX5780

## 2017-12-30 HISTORY — DX: Contact with and (suspected) exposure to other hazardous substances: Z77.29

## 2017-12-30 HISTORY — DX: Other intervertebral disc degeneration, lumbar region: M51.36

## 2017-12-30 HISTORY — DX: Polyneuropathy, unspecified: G62.9

## 2017-12-30 SURGERY — COLONOSCOPY WITH PROPOFOL
Anesthesia: General

## 2017-12-30 MED ORDER — LIDOCAINE HCL (CARDIAC) PF 100 MG/5ML IV SOSY
PREFILLED_SYRINGE | INTRAVENOUS | Status: DC | PRN
  Administered 2017-12-30: 100 mg via INTRAVENOUS

## 2017-12-30 MED ORDER — PROPOFOL 500 MG/50ML IV EMUL
INTRAVENOUS | Status: DC | PRN
  Administered 2017-12-30: 150 ug/kg/min via INTRAVENOUS

## 2017-12-30 MED ORDER — PROPOFOL 10 MG/ML IV BOLUS
INTRAVENOUS | Status: DC | PRN
  Administered 2017-12-30: 100 mg via INTRAVENOUS

## 2017-12-30 MED ORDER — PROPOFOL 500 MG/50ML IV EMUL
INTRAVENOUS | Status: AC
  Filled 2017-12-30: qty 50

## 2017-12-30 MED ORDER — SODIUM CHLORIDE 0.9 % IV SOLN
INTRAVENOUS | Status: DC
  Administered 2017-12-30: 1000 mL via INTRAVENOUS

## 2017-12-30 MED ORDER — LIDOCAINE HCL (PF) 2 % IJ SOLN
INTRAMUSCULAR | Status: AC
  Filled 2017-12-30: qty 10

## 2017-12-30 NOTE — Transfer of Care (Signed)
Immediate Anesthesia Transfer of Care Note  Patient: Gary Mccoy.  Procedure(s) Performed: COLONOSCOPY WITH PROPOFOL (N/A )  Patient Location: Endoscopy Unit  Anesthesia Type:General  Level of Consciousness: sedated  Airway & Oxygen Therapy: Patient Spontanous Breathing and Patient connected to nasal cannula oxygen  Post-op Assessment: Report given to RN and Post -op Vital signs reviewed and stable  Post vital signs: Reviewed and stable  Last Vitals:  Vitals Value Taken Time  BP    Temp    Pulse    Resp    SpO2      Last Pain:  Vitals:   12/30/17 1135  TempSrc: (P) Tympanic  PainSc:          Complications: No apparent anesthesia complications

## 2017-12-30 NOTE — Anesthesia Postprocedure Evaluation (Signed)
Anesthesia Post Note  Patient: Gary Mccoy.  Procedure(s) Performed: COLONOSCOPY WITH PROPOFOL (N/A )  Patient location during evaluation: Endoscopy Anesthesia Type: General Level of consciousness: awake and alert Pain management: pain level controlled Vital Signs Assessment: post-procedure vital signs reviewed and stable Respiratory status: spontaneous breathing, nonlabored ventilation, respiratory function stable and patient connected to nasal cannula oxygen Cardiovascular status: blood pressure returned to baseline and stable Postop Assessment: no apparent nausea or vomiting Anesthetic complications: no     Last Vitals:  Vitals:   12/30/17 1155 12/30/17 1205  BP: (!) 159/78   Pulse: 61   Resp:    Temp:    SpO2: 100% 100%    Last Pain:  Vitals:   12/30/17 1205  TempSrc:   PainSc: 0-No pain                 Martha Clan

## 2017-12-30 NOTE — Anesthesia Preprocedure Evaluation (Signed)
Anesthesia Evaluation  Patient identified by MRN, date of birth, ID band Patient awake    Reviewed: Allergy & Precautions, H&P , NPO status , Patient's Chart, lab work & pertinent test results, reviewed documented beta blocker date and time   History of Anesthesia Complications Negative for: history of anesthetic complications  Airway Mallampati: I  TM Distance: >3 FB Neck ROM: full    Dental  (+) Edentulous Upper, Edentulous Lower, Upper Dentures, Lower Dentures, Dental Advidsory Given   Pulmonary neg shortness of breath, neg sleep apnea, neg COPD, neg recent URI, Current Smoker,           Cardiovascular Exercise Tolerance: Good hypertension, (-) angina+ Peripheral Vascular Disease  (-) CAD, (-) Past MI, (-) Cardiac Stents and (-) CABG (-) dysrhythmias (-) Valvular Problems/Murmurs     Neuro/Psych neg Seizures  Neuromuscular disease CVA, Residual Symptoms negative psych ROS   GI/Hepatic Neg liver ROS, PUD, GERD  ,  Endo/Other  negative endocrine ROS  Renal/GU negative Renal ROS  negative genitourinary   Musculoskeletal   Abdominal   Peds  Hematology negative hematology ROS (+)   Anesthesia Other Findings Past Medical History: No date: Arthritis     Comment:  lower back No date: Back pain No date: 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 No date: DDD (degenerative disc disease), lumbar No date: Duodenal ulcer No date: Elevated lipids No date: GERD (gastroesophageal reflux disease) No date: Leg weakness, bilateral     Comment:  s/p lumbar surgery No date: PAD (peripheral artery disease) (HCC) No date: Polyradiculitis No date: Stroke [facility])     Comment:  Rt lower leg  estimated 10 - 12 stokes in last 5 years No date: Wears dentures     Comment:  full upper and lower   Reproductive/Obstetrics negative OB ROS                             Anesthesia Physical Anesthesia Plan  ASA: III  Anesthesia  Plan: General   Post-op Pain Management:    Induction: Intravenous  PONV Risk Score and Plan: 1 and Propofol infusion and TIVA  Airway Management Planned: Natural Airway and Nasal Cannula  Additional Equipment:   Intra-op Plan:   Post-operative Plan:   Informed Consent: I have reviewed the patients History and Physical, chart, labs and discussed the procedure including the risks, benefits and alternatives for the proposed anesthesia with the patient or authorized representative who has indicated his/her understanding and acceptance.   Dental Advisory Given  Plan Discussed with: Anesthesiologist, CRNA and Surgeon  Anesthesia Plan Comments:         Anesthesia Quick Evaluation

## 2017-12-30 NOTE — H&P (Signed)
Outpatient short stay form Pre-procedure 12/30/2017 11:04 AM Teodoro K. Alice Reichert, M.D.  Primary Physician: Emily Filbert, M.D.  Reason for visit: Patient is a 67 year old male presenting for colon polyp surveillance.    History of present illness: He has a history of bilateral anterior cerebral artery embolisms and takes  Xarelto.  Patient has held this anticoagulation for 5 days with the approval of his prescribing physician. Patient denies change in bowel habits, rectal bleeding, weight loss or abdominal pain.     Current Facility-Administered Medications:  .  0.9 %  sodium chloride infusion, , Intravenous, Continuous, Dent, Benay Pike, MD, Last Rate: 20 mL/hr at 12/30/17 1040, 1,000 mL at 12/30/17 1040  Medications Prior to Admission  Medication Sig Dispense Refill Last Dose  . vitamin B-12 (CYANOCOBALAMIN) 1000 MCG tablet Take 1,000 mcg by mouth daily.     Marland Kitchen aspirin 81 MG chewable tablet Chew 1 tablet (81 mg total) by mouth daily.   Taking  . atorvastatin (LIPITOR) 40 MG tablet Take 1 tablet (40 mg total) by mouth daily at 6 PM. 30 tablet 1 Taking  . celecoxib (CELEBREX) 200 MG capsule Take 200 mg by mouth daily.  11 Not Taking  . Multiple Vitamins-Minerals (MULTIVITAMIN ADULT PO) Take 1 tablet by mouth daily.    Taking  . nicotine (NICODERM CQ - DOSED IN MG/24 HOURS) 21 mg/24hr patch Place onto the skin.   Not Taking  . rivaroxaban (XARELTO) 20 MG TABS tablet Take 1 tablet (20 mg total) by mouth daily. 30 tablet 1 Taking  . VENTOLIN HFA 108 (90 Base) MCG/ACT inhaler Take 1-2 puffs by mouth 3 (three) times daily as needed. For congestion, shortness of breath, and/or wheezing.  0 Taking     No Known Allergies   Past Medical History:  Diagnosis Date  . Arthritis    lower back  . Back pain   . 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   . DDD (degenerative disc disease), lumbar   . Duodenal ulcer   . Elevated lipids   . GERD (gastroesophageal reflux disease)   . Leg weakness, bilateral    s/p  lumbar surgery  . PAD (peripheral artery disease) ([place])   . Polyradiculitis   . Stroke ([place])    Rt lower leg  estimated 10 - 12 stokes in last 5 years  . Wears dentures    full upper and lower    Review of systems:  Otherwise negative.    Physical Exam  Gen: Alert, oriented. Appears stated age.  HEENT: [place]/AT. PERRLA. Lungs: CTA, no wheezes. CV: RR nl S1, S2. Abd: soft, benign, no masses. BS+ Ext: No edema. Pulses 2+    Planned procedures: Proceed with colonoscopy. The patient understands the nature of the planned procedure, indications, risks, alternatives and potential complications including but not limited to bleeding, infection, perforation, damage to internal organs and possible oversedation/side effects from anesthesia. The patient agrees and gives consent to proceed.  Please refer to procedure notes for findings, recommendations and patient disposition/instructions.     Teodoro K. Alice Reichert, M.D. Gastroenterology 12/30/2017  11:04 AM

## 2017-12-30 NOTE — Interval H&P Note (Signed)
History and Physical Interval Note:  12/30/2017 11:05 AM  Gary Mccoy.  has presented today for surgery, with the diagnosis of colon cancer  The various methods of treatment have been discussed with the patient and family. After consideration of risks, benefits and other options for treatment, the patient has consented to  Procedure(s): COLONOSCOPY WITH PROPOFOL (N/A) as a surgical intervention .  [name] history has been reviewed, patient examined, no change in status, stable for surgery.  I have reviewed the patient's chart and labs.  Questions were answered to the patient's satisfaction.     [place], [place]

## 2017-12-30 NOTE — Op Note (Addendum)
[facility] Gastroenterology Patient Name: Gary Mccoy Procedure Date: 12/30/2017 10:56 AM MRN: [id number] Account #: [id number] Date of Birth: January 21, 1951 Admit Type: Outpatient Age: 67 Room: [facility] ENDO ROOM 3 Gender: Male Note Status: Finalized Procedure:            Colonoscopy Indications:          High risk colon cancer surveillance: Personal history                        of colonic polyps Providers:            Benay Pike. Alice Reichert MD, MD Referring MD:         Rusty Aus, MD (Referring MD) Medicines:            Propofol per Anesthesia Complications:        No immediate complications. Procedure:            Pre-Anesthesia Assessment:                       - The risks and benefits of the procedure and the                        sedation options and risks were discussed with the                        patient. All questions were answered and informed                        consent was obtained.                       - Patient identification and proposed procedure were                        verified prior to the procedure by the nurse. The                        procedure was verified in the procedure room.                       - ASA Grade Assessment: III - A patient with severe                        systemic disease.                       - After reviewing the risks and benefits, the patient                        was deemed in satisfactory condition to undergo the                        procedure.                       After obtaining informed consent, the colonoscope was                        passed under direct vision. Throughout the procedure,  the patient's blood pressure, pulse, and oxygen                        saturations were monitored continuously. The                        Colonoscope was introduced through the anus and                        advanced to the the cecum, identified by appendiceal   orifice and ileocecal valve. The colonoscopy was                        performed without difficulty. The patient tolerated the                        procedure well. The quality of the bowel preparation                        was good. The ileocecal valve, appendiceal orifice, and                        rectum were photographed. Findings:      The perianal and digital rectal examinations were normal. Pertinent       negatives include normal sphincter tone and no palpable rectal lesions.      Many small-mouthed diverticula were found in the sigmoid colon.      Non-bleeding internal hemorrhoids were found during retroflexion. The       hemorrhoids were Grade I (internal hemorrhoids that do not prolapse).      A 5 mm polyp was found in the distal transverse colon. The polyp was       sessile. The polyp was removed with a cold snare. Resection and       retrieval were complete. The polyp was removed with a cold snare.       Resection and retrieval were complete. Verification of patient       identification for the specimen was done by the nurse using the       patient's name and birth date. Estimated blood loss was minimal. Impression:           - Diverticulosis in the sigmoid colon.                       - Non-bleeding internal hemorrhoids.                       - The examination was otherwise normal.                       - Transverse colon polyp x 1 s/p cold snare polypectomy. Recommendation:       - Patient has a contact number available for                        emergencies. The signs and symptoms of potential                        delayed complications were discussed with the patient.                        Return to  normal activities tomorrow. Written discharge                        instructions were provided to the patient.                       - Resume previous diet.                       - Continue present medications.                       - Await pathology results.                        - Repeat colonoscopy is recommended for surveillance.                        The colonoscopy date will be determined after pathology                        results from today's exam become available for review.                       - Return to GI office PRN.                       - Resume Xarelto (rivaroxaban) at prior dose today.                        Refer to managing physician for further adjustment of                        therapy.                       - The findings and recommendations were discussed with                        the patient and their spouse. Procedure Code(s):    --- Professional ---                       [phone], Colonoscopy, flexible; with removal of tumor(s),                        polyp(s), or other lesion(s) by snare technique Diagnosis Code(s):    --- Professional ---                       K57.30, Diverticulosis of large intestine without                        perforation or abscess without bleeding                       K64.0, First degree hemorrhoids                       Z86.010, Personal history of colonic polyps CPT copyright 2018 American Medical Association. All rights reserved. The codes documented in this report are preliminary and upon coder review may  be revised to meet current compliance requirements. Efrain Sella MD, MD 12/30/2017 11:35:19 AM This report  has been signed electronically. Number of Addenda: 0 Note Initiated On: 12/30/2017 10:56 AM Scope Withdrawal Time: 0 hours 12 minutes 14 seconds  Total Procedure Duration: 0 hours 16 minutes 52 seconds  Estimated Blood Loss: Estimated blood loss was minimal.      [facility]

## 2017-12-30 NOTE — Anesthesia Post-op Follow-up Note (Signed)
Anesthesia QCDR form completed.        

## 2017-12-31 LAB — SURGICAL PATHOLOGY

## 2018-02-26 ENCOUNTER — Inpatient Hospital Stay
Admission: EM | Admit: 2018-02-26 | Discharge: 2018-02-27 | DRG: 066 | Disposition: A | Discharge status: Home / Self Care | Payer: Managed Care, Other (non HMO) | Attending: Internal Medicine | Admitting: Internal Medicine

## 2018-02-26 ENCOUNTER — Other Ambulatory Visit: Payer: Self-pay

## 2018-02-26 ENCOUNTER — Encounter: Payer: Self-pay | Admitting: Emergency Medicine

## 2018-02-26 ENCOUNTER — Inpatient Hospital Stay: Payer: Managed Care, Other (non HMO)

## 2018-02-26 ENCOUNTER — Emergency Department: Payer: Managed Care, Other (non HMO)

## 2018-02-26 DIAGNOSIS — Z8711 Personal history of peptic ulcer disease: Secondary | ICD-10-CM

## 2018-02-26 DIAGNOSIS — N289 Disorder of kidney and ureter, unspecified: Secondary | ICD-10-CM | POA: Diagnosis present

## 2018-02-26 DIAGNOSIS — E785 Hyperlipidemia, unspecified: Secondary | ICD-10-CM | POA: Diagnosis present

## 2018-02-26 DIAGNOSIS — Z7982 Long term (current) use of aspirin: Secondary | ICD-10-CM | POA: Diagnosis not present

## 2018-02-26 DIAGNOSIS — Z823 Family history of stroke: Secondary | ICD-10-CM

## 2018-02-26 DIAGNOSIS — M5136 Other intervertebral disc degeneration, lumbar region: Secondary | ICD-10-CM | POA: Diagnosis present

## 2018-02-26 DIAGNOSIS — Z66 Do not resuscitate: Secondary | ICD-10-CM | POA: Diagnosis present

## 2018-02-26 DIAGNOSIS — I10 Essential (primary) hypertension: Secondary | ICD-10-CM | POA: Diagnosis present

## 2018-02-26 DIAGNOSIS — Z8249 Family history of ischemic heart disease and other diseases of the circulatory system: Secondary | ICD-10-CM | POA: Diagnosis not present

## 2018-02-26 DIAGNOSIS — K219 Gastro-esophageal reflux disease without esophagitis: Secondary | ICD-10-CM | POA: Diagnosis present

## 2018-02-26 DIAGNOSIS — Z7901 Long term (current) use of anticoagulants: Secondary | ICD-10-CM | POA: Diagnosis not present

## 2018-02-26 DIAGNOSIS — Z833 Family history of diabetes mellitus: Secondary | ICD-10-CM

## 2018-02-26 DIAGNOSIS — I6529 Occlusion and stenosis of unspecified carotid artery: Secondary | ICD-10-CM | POA: Diagnosis present

## 2018-02-26 DIAGNOSIS — Z972 Presence of dental prosthetic device (complete) (partial): Secondary | ICD-10-CM

## 2018-02-26 DIAGNOSIS — Z8673 Personal history of transient ischemic attack (TIA), and cerebral infarction without residual deficits: Secondary | ICD-10-CM | POA: Diagnosis not present

## 2018-02-26 DIAGNOSIS — R29810 Facial weakness: Secondary | ICD-10-CM | POA: Diagnosis present

## 2018-02-26 DIAGNOSIS — I739 Peripheral vascular disease, unspecified: Secondary | ICD-10-CM | POA: Diagnosis present

## 2018-02-26 DIAGNOSIS — F17210 Nicotine dependence, cigarettes, uncomplicated: Secondary | ICD-10-CM | POA: Diagnosis present

## 2018-02-26 DIAGNOSIS — I639 Cerebral infarction, unspecified: Secondary | ICD-10-CM

## 2018-02-26 LAB — URINALYSIS, COMPLETE (UACMP) WITH MICROSCOPIC
Bacteria, UA: NONE SEEN
Bilirubin Urine: NEGATIVE
Glucose, UA: NEGATIVE mg/dL
Ketones, ur: NEGATIVE mg/dL
Leukocytes, UA: NEGATIVE
Nitrite: NEGATIVE
Protein, ur: NEGATIVE mg/dL
Specific Gravity, Urine: 1.009 (ref 1.005–1.030)
Squamous Epithelial / HPF: NONE SEEN (ref 0–5)
pH: 5 (ref 5.0–8.0)

## 2018-02-26 LAB — COMPREHENSIVE METABOLIC PANEL
ALBUMIN: 4.1 g/dL (ref 3.5–5.0)
ALK PHOS: 67 U/L (ref 38–126)
ALT: 26 U/L (ref 0–44)
ANION GAP: 7 (ref 5–15)
AST: 24 U/L (ref 15–41)
BUN: 25 mg/dL — AB (ref 8–23)
CO2: 25 mmol/L (ref 22–32)
Calcium: 8.9 mg/dL (ref 8.9–10.3)
Chloride: 105 mmol/L (ref 98–111)
Creatinine, Ser: 1.69 mg/dL — ABNORMAL HIGH (ref 0.61–1.24)
GFR calc non Af Amer: 41 mL/min — ABNORMAL LOW (ref >60)
GFR, EST AFRICAN AMERICAN: 48 mL/min — AB (ref >60)
GLUCOSE: 118 mg/dL — AB (ref 70–99)
POTASSIUM: 4.1 mmol/L (ref 3.5–5.1)
Sodium: 137 mmol/L (ref 135–145)
TOTAL PROTEIN: 6.9 g/dL (ref 6.5–8.1)
Total Bilirubin: 1.2 mg/dL (ref 0.3–1.2)

## 2018-02-26 LAB — DIFFERENTIAL
Abs Immature Granulocytes: 0.02 10*3/uL (ref 0.00–0.07)
BASOS ABS: 0.1 10*3/uL (ref 0.0–0.1)
Basophils Relative: 1 %
EOS ABS: 0.2 10*3/uL (ref 0.0–0.5)
Eosinophils Relative: 3 %
Immature Granulocytes: 0 %
LYMPHS ABS: 1.2 10*3/uL (ref 0.7–4.0)
Lymphocytes Relative: 18 %
MONO ABS: 0.6 10*3/uL (ref 0.1–1.0)
MONOS PCT: 9 %
NEUTROS ABS: 4.7 10*3/uL (ref 1.7–7.7)
Neutrophils Relative %: 69 %

## 2018-02-26 LAB — TROPONIN I

## 2018-02-26 LAB — LIPID PANEL
Cholesterol: 160 mg/dL (ref 0–200)
HDL: 35 mg/dL — ABNORMAL LOW (ref >40)
LDL CALC: 102 mg/dL — AB (ref 0–99)
Total CHOL/HDL Ratio: 4.6 RATIO
Triglycerides: 117 mg/dL (ref <150)
VLDL: 23 mg/dL (ref 0–40)

## 2018-02-26 LAB — CBC
HCT: 46.6 % (ref 39.0–52.0)
HEMOGLOBIN: 15.3 g/dL (ref 13.0–17.0)
MCH: 29.9 pg (ref 26.0–34.0)
MCHC: 32.8 g/dL (ref 30.0–36.0)
MCV: 91.2 fL (ref 80.0–100.0)
PLATELETS: 209 10*3/uL (ref 150–400)
RBC: 5.11 MIL/uL (ref 4.22–5.81)
RDW: 13.4 % (ref 11.5–15.5)
WBC: 6.8 10*3/uL (ref 4.0–10.5)
nRBC: 0 % (ref 0.0–0.2)

## 2018-02-26 LAB — GLUCOSE, CAPILLARY: GLUCOSE-CAPILLARY: 100 mg/dL — AB (ref 70–99)

## 2018-02-26 LAB — PROTIME-INR
INR: 2.24
Prothrombin Time: 24.5 seconds — ABNORMAL HIGH (ref 11.4–15.2)

## 2018-02-26 LAB — APTT: aPTT: 53 seconds — ABNORMAL HIGH (ref 24–36)

## 2018-02-26 MED ORDER — ACETAMINOPHEN 160 MG/5ML PO SOLN
650.0000 mg | ORAL | Status: DC | PRN
  Filled 2018-02-26: qty 20.3

## 2018-02-26 MED ORDER — SENNOSIDES-DOCUSATE SODIUM 8.6-50 MG PO TABS: 1.0000 | ORAL_TABLET | Freq: Every evening | ORAL | Status: DC | PRN

## 2018-02-26 MED ORDER — STROKE: EARLY STAGES OF RECOVERY BOOK
Freq: Once | Status: AC
  Administered 2018-02-26: 22:00:00 1

## 2018-02-26 MED ORDER — ACETAMINOPHEN 650 MG RE SUPP: 650.0000 mg | RECTAL | Status: DC | PRN

## 2018-02-26 MED ORDER — ACETAMINOPHEN 325 MG PO TABS: 650.0000 mg | ORAL_TABLET | ORAL | Status: DC | PRN

## 2018-02-26 MED ORDER — SODIUM CHLORIDE 0.9 % IV BOLUS
1000.0000 mL | Freq: Once | INTRAVENOUS | Status: AC
  Administered 2018-02-26: 1000 mL via INTRAVENOUS

## 2018-02-26 MED ORDER — INFLUENZA VAC SPLIT HIGH-DOSE 0.5 ML IM SUSY
0.5000 mL | PREFILLED_SYRINGE | INTRAMUSCULAR | Status: DC
  Filled 2018-02-26: qty 0.5

## 2018-02-26 NOTE — Progress Notes (Signed)
Family Meeting Note  Advance Directive: yes  Today a meeting took place with the patient  Patient being admitted with Gary Mccoy oral numbness and left upper extremity numbness. He has history of previous stroke on Xarelto. Has history of hypertension and peripheral arterial disease. Code status address with patient he wishes to be DNR. No family in the ER.  Time spent during discussion: 17 minutes. Fritzi Mandes, MD

## 2018-02-26 NOTE — ED Notes (Signed)
Left ac iv infiltrated.  Pt denies pain.  Ice pack applied.  Family at bedside.  Pt alert  Speech clear.

## 2018-02-26 NOTE — ED Triage Notes (Signed)
Pt to ED via POV c/o left sided weakness that started around 0100 in his face and lips, pt states that the weakness has progressively gotten worse and he is now having weakness in the left arm and leg. Pt is noticeably weaker on the left side but does not have any facial droop or slurred speech. Pt is in NAD at this time.

## 2018-02-26 NOTE — H&P (Signed)
[place] [facility] [place] NAME: Romen Yutzy    MR#:  [id number]  DATE OF BIRTH:  11/25/1950  DATE OF ADMISSION:  02/26/2018  PRIMARY CARE PHYSICIAN: Rusty Aus, MD   REQUESTING/REFERRING PHYSICIAN: Dr. Mariea Clonts  CHIEF COMPLAINT:  numbness and weakness of left face and upper extremity--- since yesterday and this morning  HISTORY OF PRESENT ILLNESS:   Layken A Rober Skeels. is a 67 y.o. male history of prior CVA with right lower extremity symptoms, HTN and HL, presenting with left-sided numbness.  The patient reports that around midnight or 1 AM he noticed some left perioral numbness.  This morning, his symptoms worsened and he had numbness and weakness of the left face sparing the forehead, and left upper extremity weakness and numbness.  He had no changes in his vision, speech, or difficulty walking.  He denies any headache.  He took his Xarelto this morning.  CT head shows old stroke. Patient has pending MRI. He is being admitted for possible acute stroke. No family in the room.  ER physician spoke with Dr. Doy Mince who is aware of patient being admitted PAST MEDICAL HISTORY:   Past Medical History:  Diagnosis Date  . Arthritis    lower back  . Back pain   . 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   . DDD (degenerative disc disease), lumbar   . Duodenal ulcer   . Elevated lipids   . GERD (gastroesophageal reflux disease)   . Leg weakness, bilateral    s/p lumbar surgery  . PAD (peripheral artery disease) ([place])   . Polyradiculitis   . Stroke ([place])    Rt lower leg  estimated 10 - 12 stokes in last 5 years  . Wears dentures    full upper and lower    PAST SURGICAL HISTOIRY:   Past Surgical History:  Procedure Laterality Date  . AORTA - BILATERAL FEMORAL ARTERY BYPASS GRAFT N/A 11/08/2015   Procedure: AORTA BIFEMORAL BYPASS GRAFT;  Surgeon: Katha Cabal, MD;  Location: [facility] ORS;  Service: Vascular;  Laterality: N/A;  . BACK  SURGERY  2005  . [place]  . COLONOSCOPY WITH PROPOFOL N/A 12/30/2017   Procedure: COLONOSCOPY WITH PROPOFOL;  Surgeon: [place], Benay Pike, MD;  Location: [facility] ENDOSCOPY;  Service: Gastroenterology;  Laterality: N/A;  . ENDARTERECTOMY FEMORAL Bilateral 11/08/2015   Procedure: ENDARTERECTOMY FEMORAL;  Surgeon: Katha Cabal, MD;  Location: [facility] ORS;  Service: Vascular;  Laterality: Bilateral;  common, SFA, and profundis  Aortic endarterectomy   . ESOPHAGOGASTRODUODENOSCOPY (EGD) WITH PROPOFOL N/A 09/04/2015   Procedure: ESOPHAGOGASTRODUODENOSCOPY (EGD) WITH PROPOFOL;  Surgeon: Lucilla Lame, MD;  Location: [place];  Service: Endoscopy;  Laterality: N/A;  . LUMBAR LAMINECTOMY  2006  . TEE WITHOUT CARDIOVERSION N/A 09/22/2015   Procedure: TRANSESOPHAGEAL ECHOCARDIOGRAM (TEE);  Surgeon: Teodoro Spray, MD;  Location: [facility] ORS;  Service: Cardiovascular;  Laterality: N/A;    SOCIAL HISTORY:   Social History   Tobacco Use  . Smoking status: Current Every Day Smoker    Packs/day: 1.00    Years: 45.00    Pack years: 45.00    Types: Cigarettes  . Smokeless tobacco: Never Used  Substance Use Topics  . Alcohol use: Yes    Comment: rare 3 a year    FAMILY HISTORY:   Family History  Problem Relation Age of Onset  . Heart attack Mother   . Hypertension Mother   . Varicose Veins Mother   .  Diabetes Father   . Heart attack Father   . Heart attack Maternal Grandmother   . Stroke Maternal Grandmother     DRUG ALLERGIES:  No Known Allergies  REVIEW OF SYSTEMS:  Review of Systems  Constitutional: Negative for chills, fever and weight loss.  HENT: Negative for ear discharge, ear pain and nosebleeds.   Eyes: Negative for blurred vision, pain and discharge.  Respiratory: Negative for sputum production, shortness of breath, wheezing and stridor.   Cardiovascular: Negative for chest pain, palpitations, orthopnea and PND.  Gastrointestinal: Negative for  abdominal pain, diarrhea, nausea and vomiting.  Genitourinary: Negative for frequency and urgency.  Musculoskeletal: Negative for back pain and joint pain.  Neurological: Positive for sensory change. Negative for speech change, focal weakness and weakness.  Psychiatric/Behavioral: Negative for depression and hallucinations. The patient is not nervous/anxious.      MEDICATIONS AT HOME:   Prior to Admission medications   Medication Sig Start Date End Date Taking? Authorizing Provider  aspirin 81 MG chewable tablet Chew 1 tablet (81 mg total) by mouth daily. 09/22/15  Yes Dustin Flock, MD  atorvastatin (LIPITOR) 40 MG tablet Take 1 tablet (40 mg total) by mouth daily at 6 PM. 09/22/15  Yes Dustin Flock, MD  celecoxib (CELEBREX) 200 MG capsule Take 200 mg by mouth daily. 09/12/15  Yes [provider]  Multiple Vitamins-Minerals (MULTIVITAMIN ADULT PO) Take 1 tablet by mouth daily.    Yes [provider]  rivaroxaban (XARELTO) 20 MG TABS tablet Take 1 tablet (20 mg total) by mouth daily. 09/22/15  Yes Dustin Flock, MD  vitamin B-12 (CYANOCOBALAMIN) 1000 MCG tablet Take 1,000 mcg by mouth daily.   Yes [provider]  nicotine (NICODERM CQ - DOSED IN MG/24 HOURS) 21 mg/24hr patch Place onto the skin. 11/22/15   [provider]  VENTOLIN HFA 108 (90 Base) MCG/ACT inhaler Take 1-2 puffs by mouth 3 (three) times daily as needed. For congestion, shortness of breath, and/or wheezing. 07/03/15   [provider]      VITAL SIGNS:  Blood pressure (!) 151/89, pulse 69, resp. rate 14, height _0  (1.854 m), weight 89.4 kg, SpO2 100 %.  PHYSICAL EXAMINATION:  GENERAL:  67 y.o.-year-old patient lying in the bed with no acute distress.  EYES: Pupils equal, round, reactive to light and accommodation. No scleral icterus. Extraocular muscles intact.  HEENT: Head atraumatic, normocephalic. Oropharynx and nasopharynx clear.  NECK:  Supple, no jugular venous  distention. No thyroid enlargement, no tenderness.  LUNGS: Normal breath sounds bilaterally, no wheezing, rales,rhonchi or crepitation. No use of accessory muscles of respiration.  CARDIOVASCULAR: S1, S2 normal. No murmurs, rubs, or gallops.  ABDOMEN: Soft, nontender, nondistended. Bowel sounds present. No organomegaly or mass.  EXTREMITIES: No pedal edema, cyanosis, or clubbing.  NEUROLOGIC: Cranial nerves II through XII are intact. Muscle strength 5/5 in all extremities. Sensation. Role numbness and numbness in left upper extremity. Gait not checked.  PSYCHIATRIC: The patient is alert and oriented x 3.  SKIN: No obvious rash, lesion, or ulcer.   LABORATORY PANEL:   CBC Recent Labs  Lab 02/26/18 1359  WBC 6.8  HGB 15.3  HCT 46.6  PLT 209   ------------------------------------------------------------------------------------------------------------------  Chemistries  Recent Labs  Lab 02/26/18 1359  NA 137  K 4.1  CL 105  CO2 25  GLUCOSE 118*  BUN 25*  CREATININE 1.69*  CALCIUM 8.9  AST 24  ALT 26  ALKPHOS 67  BILITOT 1.2   ------------------------------------------------------------------------------------------------------------------  Cardiac Enzymes Recent Labs  Lab 02/26/18 1359  TROPONINI <0.03   ------------------------------------------------------------------------------------------------------------------  RADIOLOGY:  Ct Head Wo Contrast  Result Date: 02/26/2018 CLINICAL DATA:  67 year old male with left side weakness since 0100 hours. History of left carotid occlusion. EXAM: CT HEAD WITHOUT CONTRAST TECHNIQUE: Contiguous axial images were obtained from the base of the skull through the vertex without intravenous contrast. COMPARISON:  Head CT 11/09/2015 and earlier. FINDINGS: Brain: Left greater than right ACA territory encephalomalacia at the vertex compatible with the July 2017 infarcts sequelae. Left posterior parietal lobe encephalomalacia is stable  since 2017. Small chronic infarcts in the left cerebellum are stable. No midline shift, ventriculomegaly, mass effect, evidence of mass lesion, intracranial hemorrhage or evidence of cortically based acute infarction. Vascular: Calcified atherosclerosis at the skull base. No suspicious intracranial vascular hyperdensity. Skull: Negative. Sinuses/Orbits: Visualized paranasal sinuses and mastoids are stable and well pneumatized. Other: Negative orbits. Negative scalp. IMPRESSION: 1. No acute intracranial abnormality identified. 2. Bilateral ACA territory, left parietal lobe and left cerebellar ischemia appears stable since 2017. Electronically Signed   By: Genevie Ann M.D.   On: 02/26/2018 14:12    EKG:  sinus rhythm with short PRN trouble  IMPRESSION AND PLAN:    Wylan A Valeriano Bain. is a 67 y.o. male history of prior CVA with right lower extremity symptoms, HTN and HL, presenting with left-sided numbness.  The patient reports that around midnight or 1 AM he noticed some left perioral numbness.  This morning, his symptoms worsened and he had numbness and weakness of the left face sparing the forehead, and left upper extremity weakness and numbness  1. Left upper extremity and petty overall numbness suspected CVA with history of CVA in the past -no focal weakness no dysarthria or dysphagia -admit to medicine floor -continue aspirin and Xarelto (multiple acute strokes in 2017) -neurology consultation with Dr. Doy Mince -MRI brain -patient has had ultrasound of the carotid duplex done in thousand 18 follows with Dr. Delana Meyer. Showed 4259% middle right internal carotid and right common carotid occlusion. Complete occlusion of left common carotid. -Repeat another ultrasound unless neurology requests -continue statins -PT OT speech  2. Hypertension -resume home meds  3. History of peripheral vascular disease/peripheral arterial disease follows with Dr. Delana Meyer  4. DVT prophylaxis on Xarelto  No  family in the room    All the records are reviewed and case discussed with ED provider. Management plans discussed with the patient, family and they are in agreement.  CODE STATUS: DNR  TOTAL TIME TAKING CARE OF THIS PATIENT: *50 minutes.    Fritzi Mandes M.D on 02/26/2018 at 4:45 PM  Between 7am to 6pm - Pager - [phone]  After 6pm go to [email or web address] - password [email or web address] [facility]  [facility]  Office  [phone]  CC: Primary care physician; Rusty Aus, MD

## 2018-02-26 NOTE — ED Provider Notes (Signed)
[facility] Emergency Department Provider Note  ____________________________________________  Time seen: Approximately 3:52 PM  I have reviewed the triage vital signs and the nursing notes.   HISTORY  Chief Complaint Weakness    HPI Gary Mccoy. is a 67 y.o. male history of prior CVA with right lower extremity symptoms, HTN and HL, presenting with left-sided numbness.  The patient reports that around midnight or 1 AM he noticed some left perioral numbness.  This morning, his symptoms worsened and he had numbness and weakness of the left face sparing the forehead, and left upper extremity weakness and numbness.  He had no changes in his vision, speech, or difficulty walking.  He denies any headache.  He took his Xarelto this morning.  Past Medical History:  Diagnosis Date  . Arthritis    lower back  . Back pain   . 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   . DDD (degenerative disc disease), lumbar   . Duodenal ulcer   . Elevated lipids   . GERD (gastroesophageal reflux disease)   . Leg weakness, bilateral    s/p lumbar surgery  . PAD (peripheral artery disease) ([place])   . Polyradiculitis   . Stroke ([place])    Rt lower leg  estimated 10 - 12 stokes in last 5 years  . Wears dentures    full upper and lower    Patient Active Problem List   Diagnosis Date Noted  . Bilateral carotid artery stenosis 04/10/2016  . Hyperlipidemia 01/01/2016  . Weakness of right lower extremity 09/19/2015  . Tobacco abuse counseling 09/19/2015  . Peripheral vascular disease ([place]) 09/19/2015  . Leukocytosis 09/19/2015  . CVA (cerebral infarction) 09/19/2015  . Paralysis ([place])   . Abnormal findings-gastrointestinal tract   . Gastritis   . Duodenal ulcer disease   . 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 08/29/2015  . Contact with and (suspected ) exposure to other hazardous substances 01/27/2014  . Degeneration of intervertebral disc of lumbar region 11/12/2013  . Degeneration of  intervertebral disc of lumbosacral region 11/12/2013  . Neuritis or radiculitis due to rupture of lumbar intervertebral disc 11/12/2013  . Thoracic neuritis 11/12/2013  . Benign essential HTN 02/01/2013    Past Surgical History:  Procedure Laterality Date  . AORTA - BILATERAL FEMORAL ARTERY BYPASS GRAFT N/A 11/08/2015   Procedure: AORTA BIFEMORAL BYPASS GRAFT;  Surgeon: Katha Cabal, MD;  Location: [facility] ORS;  Service: Vascular;  Laterality: N/A;  . BACK SURGERY  2005  . [place]  . COLONOSCOPY WITH PROPOFOL N/A 12/30/2017   Procedure: COLONOSCOPY WITH PROPOFOL;  Surgeon: Toledo, Benay Pike, MD;  Location: [facility] ENDOSCOPY;  Service: Gastroenterology;  Laterality: N/A;  . ENDARTERECTOMY FEMORAL Bilateral 11/08/2015   Procedure: ENDARTERECTOMY FEMORAL;  Surgeon: Katha Cabal, MD;  Location: [facility] ORS;  Service: Vascular;  Laterality: Bilateral;  common, SFA, and profundis  Aortic endarterectomy   . ESOPHAGOGASTRODUODENOSCOPY (EGD) WITH PROPOFOL N/A 09/04/2015   Procedure: ESOPHAGOGASTRODUODENOSCOPY (EGD) WITH PROPOFOL;  Surgeon: Lucilla Lame, MD;  Location: [place];  Service: Endoscopy;  Laterality: N/A;  . LUMBAR LAMINECTOMY  2006  . TEE WITHOUT CARDIOVERSION N/A 09/22/2015   Procedure: TRANSESOPHAGEAL ECHOCARDIOGRAM (TEE);  Surgeon: Teodoro Spray, MD;  Location: [facility] OR[facility];  Service: Cardiovascular;  Laterality: N/A;    Current Outpatient Rx  . Order #: [id number] Class: OTC  . Order #: [id number] Class: Normal  . Order #: [id number] Class: Historical Med  . Order #: [id number] Class: Historical Med  . Order #: [id number] Class: Normal  .  Order #: [id number] Class: Historical Med  . Order #: [id number] Class: Historical Med  . Order #: [id number] Class: Historical Med    Allergies Patient has no known allergies.  Family History  Problem Relation Age of Onset  . Heart attack Mother   . Hypertension Mother   . Varicose Veins Mother   . Diabetes Father   .  Heart attack Father   . Heart attack Maternal Grandmother   . Stroke Maternal Grandmother     Social History Social History   Tobacco Use  . Smoking status: Current Every Day Smoker    Packs/day: 1.00    Years: 45.00    Pack years: 45.00    Types: Cigarettes  . Smokeless tobacco: Never Used  Substance Use Topics  . Alcohol use: Yes    Comment: rare 3 a year  . Drug use: No    Review of Systems Constitutional: No fever/chills.  No lightheadedness or syncope. Eyes: No visual changes.  No blurred or double vision. ENT: No sore throat. No congestion or rhinorrhea. Cardiovascular: Denies chest pain. Denies palpitations. Respiratory: Denies shortness of breath.  No cough. Gastrointestinal: No abdominal pain.  No nausea, no vomiting.  No diarrhea.  No constipation. Genitourinary: Negative for dysuria. Musculoskeletal: Negative for back pain. Skin: Negative for rash. Neurological: Negative for headaches.  Acid of left perioral numbness, left facial numbness, mild left facial droop.  Positive left upper extremity numbness and weakness.  No difficulty walking.  No changes in vision or speech.    ____________________________________________   PHYSICAL EXAM:  VITAL SIGNS: ED Triage Vitals  Enc Vitals Group     BP 02/26/18 1511 (!) 161/85     Pulse --      Resp --      Temp --      Temp src --      SpO2 --      Weight 02/26/18 1352 197 lb (89.4 kg)     Height 02/26/18 1352 _0  (1.854 m)     Head Circumference --      Peak Flow --      Pain Score 02/26/18 1352 0     Pain Loc --      Pain Edu? --      Excl. in [place]? --     Constitutional: Alert and oriented. Answers questions appropriately.  GCS is 15. Eyes: Conjunctivae are normal.  EOMI. PERRLA.  No scleral icterus. Head: Atraumatic. Nose: No congestion/rhinnorhea. Mouth/Throat: Mucous membranes are moist.  Neck: No stridor.  Supple.  Mild JVD.  No meningismus. Cardiovascular: Normal rate, regular rhythm. No  murmurs, rubs or gallops.  Respiratory: Normal respiratory effort.  No accessory muscle use or retractions. Lungs CTAB.  No wheezes, rales or ronchi. Gastrointestinal: Soft, nontender and nondistended.  No guarding or rebound.  No peritoneal signs. Musculoskeletal: No LE edema. No ttp in the calves or palpable cords.  Negative Homan's sign. Neurologic: Alert and oriented 3. Speech is clear.  Mild left facial droop with symmetric smile.  Tongue is midline.  EOMI.  PERRLA.  No deficit in visual fields bilaterally.  No pronator drift. 5 out of 5 grip, biceps, triceps, hip flexors, plantar flexion and dorsiflexion.  Decreased sensation to light touch in the left upper extremity and the left face sparing the forehead.  Normal sensation to light touch in the bilateral lower extremities.. Normal gait without ataxia. Skin:  Skin is warm, dry and intact. No rash noted. Psychiatric: Mood and affect are normal. Speech and  behavior are normal.  Normal judgement.  ____________________________________________   LABS (all labs ordered are listed, but only abnormal results are displayed)  Labs Reviewed  COMPREHENSIVE METABOLIC PANEL - Abnormal; Notable for the following components:      Result Value   Glucose, Bld 118 (*)    BUN 25 (*)    Creatinine, Ser 1.69 (*)    GFR calc non Af Amer 41 (*)    GFR calc Af Amer 48 (*)    All other components within normal limits  GLUCOSE, CAPILLARY - Abnormal; Notable for the following components:   Glucose-Capillary 100 (*)    All other components within normal limits  CBC  DIFFERENTIAL  TROPONIN I  PROTIME-INR  APTT  URINALYSIS, COMPLETE (UACMP) WITH MICROSCOPIC  CBG MONITORING, ED   ____________________________________________  EKG  ED ECG REPORT I, Anne-Caroline Mariea Clonts, the attending physician, personally viewed and interpreted this ECG.   Date: 02/26/2018  EKG Time: 1354  Rate: 81  Rhythm: normal sinus rhythm  Axis: normal  Intervals:none  ST&T  Change: No STEMI  ____________________________________________  RADIOLOGY  Ct Head Wo Contrast  Result Date: 02/26/2018 CLINICAL DATA:  68 year old male with left side weakness since 0100 hours. History of left carotid occlusion. EXAM: CT HEAD WITHOUT CONTRAST TECHNIQUE: Contiguous axial images were obtained from the base of the skull through the vertex without intravenous contrast. COMPARISON:  Head CT 11/09/2015 and earlier. FINDINGS: Brain: Left greater than right ACA territory encephalomalacia at the vertex compatible with the July 2017 infarcts sequelae. Left posterior parietal lobe encephalomalacia is stable since 2017. Small chronic infarcts in the left cerebellum are stable. No midline shift, ventriculomegaly, mass effect, evidence of mass lesion, intracranial hemorrhage or evidence of cortically based acute infarction. Vascular: Calcified atherosclerosis at the skull base. No suspicious intracranial vascular hyperdensity. Skull: Negative. Sinuses/Orbits: Visualized paranasal sinuses and mastoids are stable and well pneumatized. Other: Negative orbits. Negative scalp. IMPRESSION: 1. No acute intracranial abnormality identified. 2. Bilateral ACA territory, left parietal lobe and left cerebellar ischemia appears stable since 2017. Electronically Signed   By: Genevie Ann M.D.   On: 02/26/2018 14:12    ____________________________________________   PROCEDURES  Procedure(s) performed: None  Procedures  Critical Care performed: No ____________________________________________   INITIAL IMPRESSION / ASSESSMENT AND PLAN / ED COURSE  Pertinent labs & imaging results that were available during my care of the patient were reviewed by me and considered in my medical decision making (see chart for details).  67 y.o. male with a prior history of CVA remotely without residual symptoms, presenting with new left facial and upper extremity numbness with left facial droop.  Overall, the patient is  mildly hypertensive at 161/85 but otherwise hemodynamically stable.  He does have some focal neurologic deficits on my exam including a subtle facial droop as well as left facial and left upper extremity numbness.  His strength is intact on my exam.  I am concerned his symptoms are related to an acute CVA; his CT scan does not show any acute intracranial process but he has been admitted for stroke work-up and an MRI has been ordered.  I spoke with Dr. Doy Mince, the neurologist on-call, who will evaluate the patient as well.  Other possible etiologies include UTI, electrolyte disturbance, but these are less likely.  [name] blood counts are reassuring, his CBC shows an acute renal insufficiency and I have ordered intravenous fluids for him, and his UA is pending.  I have admitted the patient to the  hospital is for continued evaluation and treatment.  ____________________________________________  FINAL CLINICAL IMPRESSION(S) / ED DIAGNOSES  Final diagnoses:  Cerebrovascular accident (CVA), unspecified mechanism ([place])  Acute renal insufficiency         NEW MEDICATIONS STARTED DURING THIS VISIT:  New Prescriptions   No medications on file      Eula Listen, MD 02/26/18 [phone]

## 2018-02-26 NOTE — ED Notes (Signed)
Pt reports numbness to left side of face, left arm and left hand.  Sx began last night at midnight.  Pt states he didn't think anything of it.  Hx cva.  cig smoker.  Pt denies chest pain or sob.  No n/v/d  Pt has intermittent dizziness.  No headache.  Pt is alert.  amubulating without diff.   Speech clear.

## 2018-02-26 NOTE — ED Notes (Signed)
Report off to vanessa rn

## 2018-02-26 NOTE — ED Notes (Signed)
primedoc in with pt now

## 2018-02-27 ENCOUNTER — Inpatient Hospital Stay
Admit: 2018-02-27 | Discharge: 2018-02-27 | Disposition: A | Discharge status: Home / Self Care | Payer: Managed Care, Other (non HMO) | Attending: Internal Medicine | Admitting: Internal Medicine

## 2018-02-27 DIAGNOSIS — I639 Cerebral infarction, unspecified: Principal | ICD-10-CM

## 2018-02-27 LAB — ECHOCARDIOGRAM COMPLETE
Height: 73 in
Weight: 3152 oz

## 2018-02-27 MED ORDER — RIVAROXABAN 20 MG PO TABS
20.0000 mg | ORAL_TABLET | Freq: Every day | ORAL | Status: DC
  Administered 2018-02-27: 10:00:00 20 mg via ORAL
  Filled 2018-02-27: qty 1

## 2018-02-27 MED ORDER — ATORVASTATIN CALCIUM 20 MG PO TABS: 80.0000 mg | ORAL_TABLET | Freq: Every day | ORAL | Status: DC

## 2018-02-27 MED ORDER — ATORVASTATIN CALCIUM 40 MG PO TABS: 80.0000 mg | ORAL_TABLET | Freq: Every day | ORAL | 1 refills | Status: DC

## 2018-02-27 MED ORDER — ASPIRIN 81 MG PO CHEW
81.0000 mg | CHEWABLE_TABLET | Freq: Every day | ORAL | Status: DC
  Administered 2018-02-27: 81 mg via ORAL
  Filled 2018-02-27: qty 1

## 2018-02-27 NOTE — Progress Notes (Signed)
SLP Cancellation Note  Patient Details Name: Gary Mccoy. MRN: [id number] DOB: 08-15-1950   Cancelled treatment:       Reason Eval/Treat Not Completed: SLP screened, no needs identified, will sign off    Leroy Sea, MS/CCC- SLP  Valetta Fuller, Daine Floras 02/27/2018, 11:32 AM

## 2018-02-27 NOTE — Progress Notes (Signed)
*  PRELIMINARY RESULTS* Echocardiogram 2D Echocardiogram has been performed.  Gary Mccoy [name] 02/27/2018, 1:08 PM

## 2018-02-27 NOTE — Progress Notes (Signed)
OT Cancellation Note  Patient Details Name: Gary Mccoy. MRN: [id number] DOB: 02-Jul-1950   Cancelled Treatment:    Reason Eval/Treat Not Completed: Other (comment). Order received, chart reviewed. Pt not in room; has discharged from hospital. Will sign off.   Jeni Salles, MPH, MS, OTR/L ascom [phone] 02/27/18, 3:28 PM

## 2018-02-27 NOTE — Consult Note (Addendum)
Referring Physician: Doylene Canning    Chief Complaint: Left side numbness and weakness  HPI: Gary A Juandavid Dallman. is an 67 y.o. 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  Patient reports onset of symptoms on 02/26/2018 at midnight starting in his perioral area.  He woke up in the morning worsened symptoms of numbness and weakness of the left face sparing his forehead, left upper and lower extremity numbness. Denied associated altered sensorium, speech abnormality, cranial nerve deficit, vision disturbances, nausea or vomiting, dizziness, headache, syncope or LOC.  On arrival to the ED he was noted to have mildly elevated blood pressure of 161/85.  Code stroke was not initiated as he was outside the window period for intervention.  Initial NIH stroke scale 0.  Initial CT head showed no acute intracranial abnormality.  Follow-up MRI of the brain showed acute small vessel infarct of the lateral right thalamus, adjacent to the posterior limb of the internal capsule and double old infarcts and sequelae of chronic ischemic microangiopathy.  Due to significant stroke risk factors patient was admitted for further stroke work-up and management.  Date last known well: Date: 02/26/2018 Time last known well: Time: 00:00 tPA Given: No: outside time window   Past Medical History:  Diagnosis Date  . Arthritis    lower back  . Back pain   . 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   . DDD (degenerative disc disease), lumbar   . Duodenal ulcer   . Elevated lipids   . GERD (gastroesophageal reflux disease)   . Leg weakness, bilateral    s/p lumbar surgery  . PAD (peripheral artery disease) ([place])   . Polyradiculitis   . Stroke ([place])    Rt lower leg  estimated 10 - 12 stokes in last 5 years  . Wears  dentures    full upper and lower    Past Surgical History:  Procedure Laterality Date  . AORTA - BILATERAL FEMORAL ARTERY BYPASS GRAFT N/A 11/08/2015   Procedure: AORTA BIFEMORAL BYPASS GRAFT;  Surgeon: Katha Cabal, MD;  Location: [facility];  Service: Vascular;  Laterality: N/A;  . BACK SURGERY  2005  . [place]  . COLONOSCOPY WITH PROPOFOL N/A 12/30/2017   Procedure: COLONOSCOPY WITH PROPOFOL;  Surgeon: Toledo, Benay Pike, MD;  Location: [facility];  Service: Gastroenterology;  Laterality: N/A;  . ENDARTERECTOMY FEMORAL Bilateral 11/08/2015   Procedure: ENDARTERECTOMY FEMORAL;  Surgeon: Katha Cabal, MD;  Location: [facility];  Service: Vascular;  Laterality: Bilateral;  common, SFA, and profundis  Aortic endarterectomy   . ESOPHAGOGASTRODUODENOSCOPY (EGD) WITH PROPOFOL N/A 09/04/2015   Procedure: ESOPHAGOGASTRODUODENOSCOPY (EGD) WITH PROPOFOL;  Surgeon: Lucilla Lame, MD;  Location: [facility];  Service: Endoscopy;  Laterality: N/A;  . LUMBAR LAMINECTOMY  2006  . TEE WITHOUT CARDIOVERSION N/A 09/22/2015   Procedure: TRANSESOPHAGEAL ECHOCARDIOGRAM (TEE);  Surgeon: Teodoro Spray, MD;  Location: [facility];  Service: Cardiovascular;  Laterality: N/A;    Family History  Problem Relation Age of Onset  . Heart attack Mother   . Hypertension Mother   . Varicose Veins Mother   . Diabetes Father   . Heart attack Father   . Heart attack Maternal Grandmother   . Stroke Maternal Grandmother    Social History:  reports that he has been smoking cigarettes. He  has a 45.00 pack-year smoking history. He has never used smokeless tobacco. He reports current alcohol use. He reports that he does not use drugs.  Allergies: No Known Allergies  Medications:  I have reviewed the patient's current medications. Prior to Admission:  Medications Prior to Admission  Medication Sig Dispense Refill Last Dose  . aspirin 81 MG chewable tablet Chew 1 tablet (81 mg total) by  mouth daily.   02/26/2018 at 0800  . atorvastatin (LIPITOR) 40 MG tablet Take 1 tablet (40 mg total) by mouth daily at 6 PM. 30 tablet 1 02/25/2018 at 2000  . celecoxib (CELEBREX) 200 MG capsule Take 200 mg by mouth daily.  11 02/26/2018 at 0800  . Multiple Vitamins-Minerals (MULTIVITAMIN ADULT PO) Take 1 tablet by mouth daily.    02/26/2018 at 0800  . rivaroxaban (XARELTO) 20 MG TABS tablet Take 1 tablet (20 mg total) by mouth daily. 30 tablet 1 02/25/2018 at 1200  . vitamin B-12 (CYANOCOBALAMIN) 1000 MCG tablet Take 1,000 mcg by mouth daily.   02/26/2018 at 0800  . nicotine (NICODERM CQ - DOSED IN MG/24 HOURS) 21 mg/24hr patch Place onto the skin.   Not Taking at Unknown time  . VENTOLIN HFA 108 (90 Base) MCG/ACT inhaler Take 1-2 puffs by mouth 3 (three) times daily as needed. For congestion, shortness of breath, and/or wheezing.  0 Not Taking at Unknown time   Scheduled: . atorvastatin  80 mg Oral q1800  . Influenza vac split quadrivalent PF  0.5 mL Intramuscular Tomorrow-1000    ROS: History obtained from the patient   General ROS: negative for - chills, fatigue, fever, night sweats, weight gain or weight loss Psychological ROS: negative for - behavioral disorder, hallucinations, memory difficulties, mood swings or suicidal ideation Ophthalmic ROS: negative for - blurry vision, double vision, eye pain or loss of vision ENT ROS: negative for - epistaxis, nasal discharge, oral lesions, sore throat, tinnitus or vertigo Allergy and Immunology ROS: negative for - hives or itchy/watery eyes Hematological and Lymphatic ROS: negative for - bleeding problems, bruising or swollen lymph nodes Endocrine ROS: negative for - galactorrhea, hair pattern changes, polydipsia/polyuria or temperature intolerance Respiratory ROS: negative for - cough, hemoptysis, shortness of breath or wheezing Cardiovascular ROS: negative for - chest pain, dyspnea on exertion, edema or irregular heartbeat Gastrointestinal  ROS: negative for - abdominal pain, diarrhea, hematemesis, nausea/vomiting or stool incontinence Genito-Urinary ROS: negative for - dysuria, hematuria, incontinence or urinary frequency/urgency Musculoskeletal ROS: negative for - joint swelling or muscular weakness Neurological ROS: as noted in HPI Dermatological ROS: negative for rash and skin lesion changes  Physical Examination: Blood pressure 137/73, pulse 64, temperature 98.3 F (36.8 C), temperature source Oral, resp. rate 20, height _0  (1.854 m), weight 89.4 kg, SpO2 96 %.   HEENT-  Normocephalic, no lesions, without obvious abnormality.  Normal external eye and conjunctiva.  Normal TM's bilaterally.  Normal auditory canals and external ears. Normal external nose, mucus membranes and septum.  Normal pharynx. Cardiovascular- S1, S2 normal, pulses palpable throughout   Lungs- chest clear, no wheezing, rales, normal symmetric air entry Abdomen- soft, non-tender; bowel sounds normal; no masses,  no organomegaly Extremities- no edema Lymph-no adenopathy palpable Musculoskeletal-no joint tenderness, deformity or swelling Skin-warm and dry, no hyperpigmentation, vitiligo, or suspicious lesions  Neurological Exam   Mental Status: Alert, oriented, thought content appropriate.  Speech fluent without evidence of aphasia.  Able to follow 3 step commands without difficulty. Attention span and concentration seemed appropriate  Cranial  Nerves: II: Discs flat bilaterally; Visual fields grossly normal, pupils equal, round, reactive to light and accommodation III,IV, VI: ptosis not present, extra-ocular motions intact bilaterally V,VII: smile symmetric, facial light touch sensation intact decreased on the left VIII: hearing normal bilaterally IX,X: gag reflex present XI: bilateral shoulder shrug XII: midline tongue extension Motor: Right :  Upper extremity   5/5 Without pronator drift      Left: Upper extremity   5/5 without pronator  drift Right:   Lower extremity   5/5                                          Left: Lower extremity   5/5 Tone and bulk:normal tone throughout; no atrophy noted Sensory: Pinprick and light touch decreased on the left Deep Tendon Reflexes: 2+ and symmetric with absent AJ's bilaterally Plantars: Right: Upgoing                             Left: Downgoing Cerebellar: Finger-to-nose testing intact bilaterally. Heel to shin testing normal bilaterally Gait: not tested due to safety concerns  Data Reviewed  Laboratory Studies:  Basic Metabolic Panel: Recent Labs  Lab 02/26/18 1359  NA 137  K 4.1  CL 105  CO2 25  GLUCOSE 118*  BUN 25*  CREATININE 1.69*  CALCIUM 8.9    Liver Function Tests: Recent Labs  Lab 02/26/18 1359  AST 24  ALT 26  ALKPHOS 67  BILITOT 1.2  PROT 6.9  ALBUMIN 4.1   No results for input(s): LIPASE, AMYLASE in the last 168 hours. No results for input(s): AMMONIA in the last 168 hours.  CBC: Recent Labs  Lab 02/26/18 1359  WBC 6.8  NEUTROABS 4.7  HGB 15.3  HCT 46.6  MCV 91.2  PLT 209    Cardiac Enzymes: Recent Labs  Lab 02/26/18 1359  TROPONINI <0.03    BNP: Invalid input(s): POCBNP  CBG: Recent Labs  Lab 02/26/18 1357  GLUCAP 100*    Microbiology: Results for orders placed or performed during the hospital encounter of 10/24/15  Surgical pcr screen     Status: Abnormal   Collection Time: 10/24/15  8:39 AM  Result Value Ref Range Status   MRSA, PCR NEGATIVE NEGATIVE Final   Staphylococcus aureus POSITIVE (A) NEGATIVE Final    Comment:        The Xpert SA Assay (FDA approved for NASAL specimens in patients over 75 years of age), is one component of a comprehensive surveillance program.  Test performance has been validated by [facility] for patients greater than or equal to 63 year old. It is not intended to diagnose infection nor to guide or monitor treatment.     Coagulation Studies: Recent Labs    02/26/18 1617   LABPROT 24.5*  INR 2.24    Urinalysis:  Recent Labs  Lab 02/26/18 1617  COLORURINE STRAW*  LABSPEC 1.009  PHURINE 5.0  GLUCOSEU NEGATIVE  HGBUR SMALL*  BILIRUBINUR NEGATIVE  KETONESUR NEGATIVE  PROTEINUR NEGATIVE  NITRITE NEGATIVE  LEUKOCYTESUR NEGATIVE    Lipid Panel:    Component Value Date/Time   CHOL 160 02/26/2018 1359   TRIG 117 02/26/2018 1359   HDL 35 (L) 02/26/2018 1359   CHOLHDL 4.6 02/26/2018 1359   VLDL 23 02/26/2018 1359   LDLCALC 102 (H) 02/26/2018 1359  HgbA1C:  Lab Results  Component Value Date   HGBA1C 6.1 (H) 09/19/2015    Urine Drug Screen:  No results found for: LABOPIA, COCAINSCRNUR, LABBENZ, AMPHETMU, THCU, LABBARB  Alcohol Level: No results for input(s): ETH in the last 168 hours.  Other results: EKG: normal EKG, normal sinus rhythm, unchanged from previous tracings. Vent. rate 81 BPM PR interval 102 ms QRS duration 80 ms QT/QTc 358/415 ms P-R-T axes 3 32 10  Imaging: Ct Head Wo Contrast  Result Date: 02/26/2018 CLINICAL DATA:  67 year old male with left side weakness since 0100 hours. History of left carotid occlusion. EXAM: CT HEAD WITHOUT CONTRAST TECHNIQUE: Contiguous axial images were obtained from the base of the skull through the vertex without intravenous contrast. COMPARISON:  Head CT 11/09/2015 and earlier. FINDINGS: Brain: Left greater than right ACA territory encephalomalacia at the vertex compatible with the July 2017 infarcts sequelae. Left posterior parietal lobe encephalomalacia is stable since 2017. Small chronic infarcts in the left cerebellum are stable. No midline shift, ventriculomegaly, mass effect, evidence of mass lesion, intracranial hemorrhage or evidence of cortically based acute infarction. Vascular: Calcified atherosclerosis at the skull base. No suspicious intracranial vascular hyperdensity. Skull: Negative. Sinuses/Orbits: Visualized paranasal sinuses and mastoids are stable and well pneumatized. Other:  Negative orbits. Negative scalp. IMPRESSION: 1. No acute intracranial abnormality identified. 2. Bilateral ACA territory, left parietal lobe and left cerebellar ischemia appears stable since 2017. Electronically Signed   By: Genevie Ann M.D.   On: 02/26/2018 14:12   [name] Contrast  Result Date: 02/26/2018 CLINICAL DATA:  Left facial numbness and weakness. Left upper extremity numbness and weakness. EXAM: MRI HEAD WITHOUT CONTRAST TECHNIQUE: Multiplanar, multiecho pulse sequences of the brain and surrounding structures were obtained without intravenous contrast. COMPARISON:  Head CT 02/26/2018 FINDINGS: BRAIN: There is a small focus of abnormal diffusion restriction within the lateral right thalamus, adjacent to the posterior limb of the internal capsule. No associated hemorrhage or mass effect. There are old infarcts within the left PCA and both ACA territories and within the left cerebellar hemisphere. The midline structures are normal. No midline shift or other mass effect. Early confluent hyperintense T2-weighted signal of the periventricular and deep white matter, most commonly due to chronic ischemic microangiopathy. Mild generalized atrophy. Susceptibility-sensitive sequences show no chronic microhemorrhage or superficial siderosis. VASCULAR: Major intracranial arterial and venous sinus flow voids are normal. SKULL AND UPPER CERVICAL SPINE: Calvarial bone marrow signal is normal. There is no skull base mass. Visualized upper cervical spine and soft tissues are normal. SINUSES/ORBITS: No fluid levels or advanced mucosal thickening. No mastoid or middle ear effusion. The orbits are normal. IMPRESSION: 1. Acute small vessel infarct of the lateral right thalamus, adjacent to the posterior limb of the internal capsule and in keeping with reported left-sided symptoms. 2. No hemorrhage or mass effect. 3. Multiple old infarcts and sequelae of chronic ischemic microangiopathy. Electronically Signed   By: Ulyses Jarred M.D.   On: 02/26/2018 17:44    Assessment: 67 y.o. 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  MRI of the brain reviewed and show acute small vessel infarct of the lateral right thalamus, adjacent to the posterior limb of the internal capsule and double old infarcts and sequelae of chronic ischemic microangiopathy.  Etiology likely embolic.  Ultrasound carotids  done 09/20/2017 showed long segment occlusion of the left common carotid artery with flow reconstitution, less than 50% stenosis in the left ICA.  Would not repeat at this time.  Patient following with vascular for management.  LDL 102.  Patient was on anticoagulation prior to this event.    Stroke Risk Factors - carotid stenosis, family history, hyperlipidemia, hypertension and smoking  Plan: 1. HgbA1c  2. Continue medical management with Xarelto 3. Maximize Statin therapy with goal low density lipoprotein (LDL) <70 mg/dl 4. Smoking cessation counseling 5. Continue follow up with vascular on an outpatient basis 6. Follow up with neurology on an outpatient basis  This patient was staffed with Dr. Magda Paganini, Doy Mince who personally evaluated patient, reviewed documentation and agreed with assessment and plan of care as above.  Rufina Falco, DNP, FNP-BC Board certified Nurse Practitioner Neurology Department  02/27/2018, 9:11 AM

## 2018-02-27 NOTE — Care Management Note (Signed)
Case Management Note  Patient Details  Name: Sem A Phil Dopp. MRN: [id number] Date of Birth: November 20, 1950  Subjective/Objective:    Admitted to [facility] with the diagnosis of CVA. Lives with wife, Malachy Mood [phone]). Seen [name] last August 2019. Prescriptions are filled at [facility] No home health. No skilled facility. No home oxygen. Cane in the home, if needed.  Takes care of all basic activities of daily living himself, drives. No falls. Good appetite.                Action/Plan: No follow-transitions of care noted at this time   Expected Discharge Date:                  Expected Discharge Plan:     In-House Referral:   yes  Discharge planning Services   yes  Post Acute Care Choice:    Choice offered to:     DME Arranged:    DME Agency:     HH Arranged:    HH Agency:     Status of Service:     If discussed at [employer] [facility] [employer], dates discussed:    Additional Comments:  Shelbie Ammons, RN MSN CCM Care Management [phone] 02/27/2018, 10:34 AM

## 2018-02-27 NOTE — Plan of Care (Signed)
Pt is still having numbness on left face and arm. No signs of distress noted. Will continue to monitor.

## 2018-02-27 NOTE — Progress Notes (Signed)
PT Screen Note  Patient Details Name: Gary Mccoy. MRN: [id number] DOB: May 20, 1950   Cancelled Treatment:    Reason Eval/Treat Not Completed: PT screened, no needs identified, will sign off Screened for PT needs. Patient with UE sensation deficits (intact, abnormal). Able to ambulate 258f with dynamic balance tasks, negotiate 6 steps without handrail, demonstrate good standing balance with narrow BOS/SLS, and demonstrate gait speed 2.420m. Very agitated throughout screen. No further PT needs   ChShelton Silvas2/13/2019, 2:07 PM

## 2018-03-02 NOTE — Discharge Summary (Signed)
[facility] NAME: Gary Mccoy    MR#:  [id number]  DATE OF BIRTH:  07-Jan-1951  DATE OF ADMISSION:  02/26/2018 ADMITTING PHYSICIAN: Fritzi Mandes, MD  DATE OF DISCHARGE: 02/27/2018  3:29 PM  PRIMARY CARE PHYSICIAN: Rusty Aus, MD    ADMISSION DIAGNOSIS:  Acute renal insufficiency [N28.9] Cerebrovascular accident (CVA), unspecified mechanism ([place]) [I63.9]  DISCHARGE DIAGNOSIS:  Active Problems:   CVA (cerebral vascular accident) ([place])   SECONDARY DIAGNOSIS:   Past Medical History:  Diagnosis Date  . Arthritis    lower back  . Back pain   . 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   . DDD (degenerative disc disease), lumbar   . Duodenal ulcer   . Elevated lipids   . GERD (gastroesophageal reflux disease)   . Leg weakness, bilateral    s/p lumbar surgery  . PAD (peripheral artery disease) ([place])   . Polyradiculitis   . Stroke ([place])    Rt lower leg  estimated 10 - 12 stokes in last 5 years  . Wears dentures    full upper and lower    HOSPITAL COURSE:   Gary Mccoyis a 67 y.o.malehistory of prior CVA with right lower extremity symptoms, HTN and HL, presenting with left-sided numbness. The patient reports that around midnight or 1 AM he noticed some left perioral numbness. This morning, his symptoms worsened and he had numbness and weakness of the left face sparing the forehead, and left upper extremity weakness and numbness  1. Left upper extremity and petty overall numbness suspected CVA with history of CVA in the past -no focal weakness no dysarthria or dysphagia -continue aspirin and Xarelto (multiple acute strokes in 2017) -neurology consultation with Dr. Doy Mince -MRI brain- confirms acute stroke. -patient has had ultrasound of the carotid duplex done in thousand 18 follows with Dr. Delana Meyer. Showed 50% middle right internal carotid and right common carotid occlusion. Complete occlusion of left common  carotid. - no need to repeat US carotid as follows regular with vascular. -continue statins -PT OT speech- stable. - LDL was high- statin dose changed, increased to 80 mg daily.  2. Hypertension -resume home meds  3. History of peripheral vascular disease/peripheral arterial disease follows with Dr. Delana Meyer  4. DVT prophylaxis on Xarelto   DISCHARGE CONDITIONS:   stabl.e CONSULTS OBTAINED:  Treatment Team:  Catarina Hartshorn, MD Alexis Goodell, MD  DRUG ALLERGIES:  No Known Allergies  DISCHARGE MEDICATIONS:   Allergies as of 02/27/2018   No Known Allergies     Medication List    TAKE these medications   aspirin 81 MG chewable tablet Chew 1 tablet (81 mg total) by mouth daily.   atorvastatin 40 MG tablet Commonly known as:  LIPITOR Take 2 tablets (80 mg total) by mouth daily at 6 PM. What changed:  how much to take   celecoxib 200 MG capsule Commonly known as:  CELEBREX Take 200 mg by mouth daily.   MULTIVITAMIN ADULT PO Take 1 tablet by mouth daily.   nicotine 21 mg/24hr patch Commonly known as:  NICODERM CQ - dosed in mg/24 hours Place onto the skin.   rivaroxaban 20 MG Tabs tablet Commonly known as:  XARELTO Take 1 tablet (20 mg total) by mouth daily.   VENTOLIN HFA 108 (90 Base) MCG/ACT inhaler Generic drug:  albuterol Take 1-2 puffs by mouth 3 (three) times daily as needed. For congestion, shortness of breath, and/or wheezing.   vitamin B-12 1000  MCG tablet Commonly known as:  CYANOCOBALAMIN Take 1,000 mcg by mouth daily.        DISCHARGE INSTRUCTIONS:    Follow with neurology clinic in 12 weeks.  If you experience worsening of your admission symptoms, develop shortness of breath, life threatening emergency, suicidal or homicidal thoughts you must seek medical attention immediately by calling 911 or calling your MD immediately  if symptoms less severe.  You Must read complete instructions/literature along with all the possible  adverse reactions/side effects for all the Medicines you take and that have been prescribed to you. Take any new Medicines after you have completely understood and accept all the possible adverse reactions/side effects.   Please note  You were cared for by a hospitalist during your hospital stay. If you have any questions about your discharge medications or the care you received while you were in the hospital after you are discharged, you can call the unit and asked to speak with the hospitalist on call if the hospitalist that took care of you is not available. Once you are discharged, your primary care physician will handle any further medical issues. Please note that NO REFILLS for any discharge medications will be authorized once you are discharged, as it is imperative that you return to your primary care physician (or establish a relationship with a primary care physician if you do not have one) for your aftercare needs so that they can reassess your need for medications and monitor your lab values.    Today   CHIEF COMPLAINT:   Chief Complaint  Patient presents with  . Weakness    HISTORY OF PRESENT ILLNESS:  Gary Mccoy  is a 67 y.o. male with a known history of prior CVA with right lower extremity symptoms, HTN and HL, presenting with left-sided numbness. The patient reports that around midnight or 1 AM he noticed some left perioral numbness. This morning, his symptoms worsened and he had numbness and weakness of the left face sparing the forehead, and left upper extremity weakness and numbness. He had no changes in his vision, speech, or difficulty walking. He denies any headache. He took his Xarelto this morning.  CT head shows old stroke. Patient has pending MRI. He is being admitted for possible acute stroke. No family in the room.  ER physician spoke with Dr. Doy Mince who is aware of patient being admitted   VITAL SIGNS:  Blood pressure (!) 147/90, pulse 64,  temperature 98 F (36.7 C), temperature source Oral, resp. rate 18, height 6' 1" (1.854 m), weight 89.4 kg, SpO2 99 %.  I/O:  No intake or output data in the 24 hours ending 03/02/18 1110  PHYSICAL EXAMINATION:  GENERAL:  67 y.o.-year-old patient lying in the bed with no acute distress.  EYES: Pupils equal, round, reactive to light and accommodation. No scleral icterus. Extraocular muscles intact.  HEENT: Head atraumatic, normocephalic. Oropharynx and nasopharynx clear.  NECK:  Supple, no jugular venous distention. No thyroid enlargement, no tenderness.  LUNGS: Normal breath sounds bilaterally, no wheezing, rales,rhonchi or crepitation. No use of accessory muscles of respiration.  CARDIOVASCULAR: S1, S2 normal. No murmurs, rubs, or gallops.  ABDOMEN: Soft, non-tender, non-distended. Bowel sounds present. No organomegaly or mass.  EXTREMITIES: No pedal edema, cyanosis, or clubbing.  NEUROLOGIC: Cranial nerves II through XII are intact. Muscle strength 5/5 in all extremities. Sensation intact. Gait not checked.  PSYCHIATRIC: The patient is alert and oriented x 3.  SKIN: No obvious rash, lesion, or ulcer.  DATA REVIEW:   CBC Recent Labs  Lab 02/26/18 1359  WBC 6.8  HGB 15.3  HCT 46.6  PLT 209    Chemistries  Recent Labs  Lab 02/26/18 1359  NA 137  K 4.1  CL 105  CO2 25  GLUCOSE 118*  BUN 25*  CREATININE 1.69*  CALCIUM 8.9  AST 24  ALT 26  ALKPHOS 67  BILITOT 1.2    Cardiac Enzymes Recent Labs  Lab 02/26/18 1359  TROPONINI <0.03    Microbiology Results  Results for orders placed or performed during the hospital encounter of 10/24/15  Surgical pcr screen     Status: Abnormal   Collection Time: 10/24/15  8:39 AM  Result Value Ref Range Status   MRSA, PCR NEGATIVE NEGATIVE Final   Staphylococcus aureus POSITIVE (A) NEGATIVE Final    Comment:        The Xpert SA Assay (FDA approved for NASAL specimens in patients over 6 years of age), is one component  of a comprehensive surveillance program.  Test performance has been validated by [facility] for patients greater than or equal to 35 year old. It is not intended to diagnose infection nor to guide or monitor treatment.     RADIOLOGY:  No results found.  EKG:   Orders placed or performed during the hospital encounter of 02/26/18  . ED EKG  . ED EKG  . EKG 12-Lead  . EKG 12-Lead  . EKG      Management plans discussed with the patient, family and they are in agreement.  CODE STATUS:  Code Status History    Date Active Date Inactive Code Status Order ID Comments User Context   02/26/2018 2105 02/27/2018 1834 DNR [id number]  Fritzi Mandes, MD Inpatient   11/08/2015 1433 11/15/2015 2017 Full Code [id number]  Delana Meyer, Dolores Lory, MD Inpatient   09/19/2015 1650 09/22/2015 1728 Full Code [id number]  Theodoro Grist, MD Inpatient    Questions for Most Recent Historical Code Status (Order [id number])    Question Answer Comment   In the event of cardiac or respiratory ARREST Do not call a "code blue"    In the event of cardiac or respiratory ARREST Do not perform Intubation, CPR, defibrillation or ACLS    In the event of cardiac or respiratory ARREST Use medication by any route, position, wound care, and other measures to relive pain and suffering. May use oxygen, suction and manual treatment of airway obstruction as needed for comfort.         Advance Directive Documentation     Most Recent Value  Type of Advance Directive  Healthcare Power of Attorney, Living will  Pre-existing out of facility DNR order (yellow form or pink MOST form)  -  "MOST" Form in Place?  -      TOTAL TIME TAKING CARE OF THIS PATIENT: 35 minutes.    Vaughan Basta M.D on 03/02/2018 at 11:10 AM  Between 7am to 6pm - Pager - [phone]  After 6pm go to [email or web address] - password [email or web address] [facility]  Office  [phone]  CC: Primary care physician; Rusty Aus, MD   Note:  This dictation was prepared with Dragon dictation along with smaller phrase technology. Any transcriptional errors that result from this process are unintentional.

## 2018-03-05 ENCOUNTER — Telehealth: Payer: Self-pay

## 2018-03-05 NOTE — Telephone Encounter (Signed)
EMMI flagged for loss of interest. CSW contacted patient regarding this concern. CSW introduced self and explained reason for calling. Patient reports that he has received automated calls but does not remember stating he has lost interest in things. Patient does report that he has lost interest in things which he feels is normal for his age and current condition. Patient reports that he had a stroke and that he is physically unable to do some of the things he enjoyed before. Patient reports that he does not feel depressed or down and has a good support system in place. Patient denies SI/HI. Patient reports that he does not need any additional information or referrals at this time. CSW thanked patient for taking the phone call and speaking with CSW.   [place], [place]

## 2018-04-10 ENCOUNTER — Other Ambulatory Visit (INDEPENDENT_AMBULATORY_CARE_PROVIDER_SITE_OTHER): Payer: Self-pay | Admitting: Vascular Surgery

## 2018-04-10 DIAGNOSIS — I739 Peripheral vascular disease, unspecified: Secondary | ICD-10-CM

## 2018-04-10 DIAGNOSIS — Z95828 Presence of other vascular implants and grafts: Secondary | ICD-10-CM

## 2018-04-13 ENCOUNTER — Ambulatory Visit (INDEPENDENT_AMBULATORY_CARE_PROVIDER_SITE_OTHER): Payer: Managed Care, Other (non HMO)

## 2018-04-13 ENCOUNTER — Encounter (INDEPENDENT_AMBULATORY_CARE_PROVIDER_SITE_OTHER): Payer: Self-pay | Admitting: Vascular Surgery

## 2018-04-13 ENCOUNTER — Ambulatory Visit (INDEPENDENT_AMBULATORY_CARE_PROVIDER_SITE_OTHER): Payer: Managed Care, Other (non HMO) | Admitting: Vascular Surgery

## 2018-04-13 VITALS — BP 161/89 | HR 69 | Resp 12 | Ht 73.0 in | Wt 204.4 lb

## 2018-04-13 DIAGNOSIS — I6523 Occlusion and stenosis of bilateral carotid arteries: Secondary | ICD-10-CM

## 2018-04-13 DIAGNOSIS — I739 Peripheral vascular disease, unspecified: Secondary | ICD-10-CM

## 2018-04-13 DIAGNOSIS — Z95828 Presence of other vascular implants and grafts: Secondary | ICD-10-CM

## 2018-04-13 DIAGNOSIS — E785 Hyperlipidemia, unspecified: Secondary | ICD-10-CM

## 2018-04-13 DIAGNOSIS — F17210 Nicotine dependence, cigarettes, uncomplicated: Secondary | ICD-10-CM

## 2018-04-13 DIAGNOSIS — M5137 Other intervertebral disc degeneration, lumbosacral region: Secondary | ICD-10-CM | POA: Diagnosis not present

## 2018-04-13 DIAGNOSIS — I10 Essential (primary) hypertension: Secondary | ICD-10-CM

## 2018-04-13 DIAGNOSIS — M51379 Other intervertebral disc degeneration, lumbosacral region without mention of lumbar back pain or lower extremity pain: Secondary | ICD-10-CM

## 2018-04-13 NOTE — Progress Notes (Signed)
MRN : [id number]  Gary Mccoy. is a 68 y.o. (September 29, 1950) male who presents with chief complaint of No chief complaint on file. Marland Kitchen  History of Present Illness:   The patient returns to the office for followup and review of the noninvasive studies. There have been no interval changes in lower extremity symptoms. No interval shortening of the patient's claudication distance or development of rest pain symptoms. No new ulcers or wounds have occurred since the last visit.  The patient denies amaurosis fugax or recent TIA symptoms. There are no further neurological changes noted since DC in December s/p CVA.  There have been no significant changes to the patient's overall health care.  The patient denies history of DVT, PE or superficial thrombophlebitis. The patient denies recent episodes of angina or shortness of breath.   ABI Rt=0.91 and Lt=0.97  (previous ABI's Rt=1.07 and Lt=1.19) Duplex ultrasound of the aorta iliac arteries shows the ABF bypass is patent no hemodynamically significant stenosis identified Carotid duplex shows RICA 78-58% and LICA <85%, no change   No outpatient medications have been marked as taking for the 04/13/18 encounter (Appointment) with Delana Meyer, Dolores Lory, MD.    Past Medical History:  Diagnosis Date  . Arthritis    lower back  . Back pain   . 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   . DDD (degenerative disc disease), lumbar   . Duodenal ulcer   . Elevated lipids   . GERD (gastroesophageal reflux disease)   . Leg weakness, bilateral    s/p lumbar surgery  . PAD (peripheral artery disease) ([place])   . Polyradiculitis   . Stroke ([place])    Rt lower leg  estimated 10 - 12 stokes in last 5 years  . Wears dentures    full upper and lower    Past Surgical History:  Procedure Laterality Date  . AORTA - BILATERAL FEMORAL ARTERY BYPASS GRAFT N/A 11/08/2015   Procedure: AORTA BIFEMORAL BYPASS GRAFT;  Surgeon: Katha Cabal, MD;  Location: [facility] ORS;  Service:  Vascular;  Laterality: N/A;  . BACK SURGERY  2005  . [place]  . COLONOSCOPY WITH PROPOFOL N/A 12/30/2017   Procedure: COLONOSCOPY WITH PROPOFOL;  Surgeon: Toledo, Benay Pike, MD;  Location: [facility] ENDOSCOPY;  Service: Gastroenterology;  Laterality: N/A;  . ENDARTERECTOMY FEMORAL Bilateral 11/08/2015   Procedure: ENDARTERECTOMY FEMORAL;  Surgeon: Katha Cabal, MD;  Location: [facility] ORS;  Service: Vascular;  Laterality: Bilateral;  common, SFA, and profundis  Aortic endarterectomy   . ESOPHAGOGASTRODUODENOSCOPY (EGD) WITH PROPOFOL N/A 09/04/2015   Procedure: ESOPHAGOGASTRODUODENOSCOPY (EGD) WITH PROPOFOL;  Surgeon: Lucilla Lame, MD;  Location: [facility];  Service: Endoscopy;  Laterality: N/A;  . LUMBAR LAMINECTOMY  2006  . TEE WITHOUT CARDIOVERSION N/A 09/22/2015   Procedure: TRANSESOPHAGEAL ECHOCARDIOGRAM (TEE);  Surgeon: Teodoro Spray, MD;  Location: [facility] ORS;  Service: Cardiovascular;  Laterality: N/A;    Social History Social History   Tobacco Use  . Smoking status: Current Every Day Smoker    Packs/day: 1.00    Years: 45.00    Pack years: 45.00    Types: Cigarettes  . Smokeless tobacco: Never Used  Substance Use Topics  . Alcohol use: Yes    Comment: rare 3 a year  . Drug use: No    Family History Family History  Problem Relation Age of Onset  . Heart attack Mother   . Hypertension Mother   . Varicose Veins Mother   . Diabetes Father   .  Heart attack Father   . Heart attack Maternal Grandmother   . Stroke Maternal Grandmother     No Known Allergies   REVIEW OF SYSTEMS (Negative unless checked)  Constitutional: _0 Weight loss  _1 Fever  _2 Chills Cardiac: _3 Chest pain   _4 Chest pressure   _5 Palpitations   _6 Shortness of breath when laying flat   _7 Shortness of breath with exertion. Vascular:  _8 Pain in legs with walking   _9 Pain in legs at rest  _10 History of DVT   _11 Phlebitis   _12 Swelling in legs   _13 Varicose veins   _14 Non-healing  ulcers Pulmonary:   _15 Uses home oxygen   _16 Productive cough   _17 Hemoptysis   _18 Wheeze  _19 COPD   _20 Asthma Neurologic:  _21 Dizziness   _22 Seizures   _23 History of stroke   _24 History of TIA  _25 Aphasia   _26 Vissual changes   _27 Weakness or numbness in arm   _28 Weakness or numbness in leg Musculoskeletal:   _29 Joint swelling   _30 Joint pain   _31 Low back pain Hematologic:  _32 Easy bruising  _33 Easy bleeding   _34 Hypercoagulable state   _35 Anemic Gastrointestinal:  _36 Diarrhea   _37 Vomiting  _38 Gastroesophageal reflux/heartburn   _39 Difficulty swallowing. Genitourinary:  _40 Chronic kidney disease   _41 Difficult urination  _42 Frequent urination   _43 Blood in urine Skin:  _44 Rashes   _45 Ulcers  Psychological:  _46 History of anxiety   _47  History of major depression.  Physical Examination  There were no vitals filed for this visit. There is no height or weight on file to calculate BMI. Gen: WD/WN, NAD Head: [place]/AT, No temporalis wasting.  Ear/Nose/Throat: Hearing grossly intact, nares w/o erythema or drainage Eyes: PER, EOMI, sclera nonicteric.  Neck: Supple, no large masses.   Pulmonary:  Good air movement, no audible wheezing bilaterally, no use of accessory muscles.  Cardiac: RRR, no JVD Vascular:  Right carotid bruit Vessel Right Left  Radial Palpable Palpable  Brachial Palpable Palpable  Carotid Palpable Palpable  Popliteal Palpable Palpable  PT Palpable Palpable  DP Trace Palpable Palpable  Gastrointestinal: Non-distended. No guarding/no peritoneal signs.  Musculoskeletal: M/S 5/5 throughout.  No deformity or atrophy.  Neurologic: CN 2-12 intact. Symmetrical.  Speech is fluent. Motor exam as listed above. Psychiatric: Judgment intact, Mood & affect appropriate for pt's clinical situation. Dermatologic: No rashes or ulcers noted.  No changes consistent with cellulitis. Lymph : No lichenification or skin changes of chronic lymphedema.  CBC Lab Results  Component Value Date   WBC 6.8 02/26/2018   HGB  15.3 02/26/2018   HCT 46.6 02/26/2018   MCV 91.2 02/26/2018   PLT 209 02/26/2018    BMET    Component Value Date/Time   NA 137 02/26/2018 1359   K 4.1 02/26/2018 1359   CL 105 02/26/2018 1359   CO2 25 02/26/2018 1359   GLUCOSE 118 (H) 02/26/2018 1359   BUN 25 (H) 02/26/2018 1359   CREATININE 1.69 (H) 02/26/2018 1359   CALCIUM 8.9 02/26/2018 1359   GFRNONAA 41 (L) 02/26/2018 1359   GFRAA 48 (L) 02/26/2018 1359   CrCl cannot be calculated (Patient's most recent lab result is older than the maximum 21 days allowed.).  COAG Lab Results  Component Value Date   INR 2.24 02/26/2018   INR 1.21 11/08/2015   INR 1.37 10/24/2015    Radiology No results found.   Assessment/Plan 1. Bilateral carotid artery stenosis Recommend:  Given the patient's asymptomatic subcritical stenosis no further invasive testing or surgery at this time.  Duplex ultrasound shows <60% stenosis bilaterally.  Continue antiplatelet therapy as prescribed  Continue management of CAD, HTN and Hyperlipidemia Healthy heart diet,  encouraged exercise at least 4 times per week Follow up in 12 months with duplex ultrasound and physical exam    A total of 30 minutes was spent with this patient and greater than 50% was spent in counseling and coordination of care with the patient.  Discussion included the treatment options for vascular disease including indications for surgery and intervention.  Also discussed is the appropriate timing of treatment.  In addition medical therapy was discussed.  - VAS US CAROTID; Future  2. Peripheral vascular disease ([place])  Recommend:  The patient has evidence of atherosclerosis of the lower extremities with claudication.  The patient does not voice lifestyle limiting changes at this point in time.  Noninvasive studies do not suggest clinically significant change.  No invasive studies, angiography or surgery at this time The patient should continue walking and begin a more  formal exercise program.  The patient should continue antiplatelet therapy and aggressive treatment of the lipid abnormalities  No changes in the patient's medications at this time  The patient should continue wearing graduated compression socks 10-15 mmHg strength to control the mild edema.   - VAS US AORTA/IVC/ILIACS; Future - VAS [place] ABI WITH/WO TBI; Future  3. Benign essential HTN Continue antihypertensive medications as already ordered, these medications have been reviewed and there are no changes at this time.   4. Degeneration of intervertebral disc of lumbosacral region Continue NSAID medications as already ordered, these medications have been reviewed and there are no changes at this time.  Continued activity and therapy was stressed.   5. Hyperlipidemia, unspecified hyperlipidemia type Continue statin as ordered and reviewed, no changes at this time     Hortencia Pilar, MD  04/13/2018 9:42 AM

## 2018-12-31 DIAGNOSIS — N1832 Chronic kidney disease, stage 3b: Secondary | ICD-10-CM | POA: Insufficient documentation

## 2019-04-15 ENCOUNTER — Ambulatory Visit (INDEPENDENT_AMBULATORY_CARE_PROVIDER_SITE_OTHER): Payer: Managed Care, Other (non HMO) | Admitting: Vascular Surgery

## 2019-04-15 ENCOUNTER — Ambulatory Visit (INDEPENDENT_AMBULATORY_CARE_PROVIDER_SITE_OTHER): Payer: Managed Care, Other (non HMO)

## 2019-04-15 ENCOUNTER — Encounter (INDEPENDENT_AMBULATORY_CARE_PROVIDER_SITE_OTHER): Payer: Self-pay | Admitting: Vascular Surgery

## 2019-04-15 ENCOUNTER — Other Ambulatory Visit: Payer: Self-pay

## 2019-04-15 VITALS — BP 146/70 | HR 81 | Resp 16 | Wt 206.0 lb

## 2019-04-15 DIAGNOSIS — I739 Peripheral vascular disease, unspecified: Secondary | ICD-10-CM

## 2019-04-15 DIAGNOSIS — M5136 Other intervertebral disc degeneration, lumbar region: Secondary | ICD-10-CM

## 2019-04-15 DIAGNOSIS — I10 Essential (primary) hypertension: Secondary | ICD-10-CM

## 2019-04-15 DIAGNOSIS — I6523 Occlusion and stenosis of bilateral carotid arteries: Secondary | ICD-10-CM

## 2019-04-15 DIAGNOSIS — E785 Hyperlipidemia, unspecified: Secondary | ICD-10-CM

## 2019-04-15 NOTE — Progress Notes (Signed)
MRN : [id number]  Gary Mccoy. is a 69 y.o. (08/01/1950) male who presents with chief complaint of PAD s/p bypass.  History of Present Illness:   The patient returns to the office for followup and review of the noninvasive studies. There have been no interval changes in lower extremity symptoms. No interval shortening of the patient's claudication distance or development of rest pain symptoms. No new ulcers or wounds have occurred since the last visit.  The patient denies amaurosis fugax or recent TIA symptoms. There are no further neurological changes noted since DC in December s/p CVA.  There have been no significant changes to the patient's overall health care.  The patient denies history of DVT, PE or superficial thrombophlebitis. The patient denies recent episodes of angina or shortness of breath.   ABI Rt=0.65 and Lt=0.80  (previous ABI's Rt=0.91 and Lt=0.97 ) Duplex ultrasound of the aorta iliac arteries shows the ABF bypass is patent no hemodynamically significant stenosis identified Carotid duplex shows RICA 55-21% and LICA patent but with occlusion of the common and the external.  There is filling of the ICA via an atypical superior thyroidal branch   No outpatient medications have been marked as taking for the 04/15/19 encounter (Appointment) with Delana Meyer, Dolores Lory, MD.    Past Medical History:  Diagnosis Date  . Arthritis    lower back  . Back pain   . 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   . DDD (degenerative disc disease), lumbar   . Duodenal ulcer   . Elevated lipids   . GERD (gastroesophageal reflux disease)   . Leg weakness, bilateral    s/p lumbar surgery  . PAD (peripheral artery disease) ([place])   . Polyradiculitis   . Stroke ([place])    Rt lower leg  estimated 10 - 12 stokes in last 5 years  . Wears dentures    full upper and lower    Past Surgical History:  Procedure Laterality Date  . AORTA - BILATERAL FEMORAL ARTERY BYPASS GRAFT N/A 11/08/2015   Procedure: AORTA BIFEMORAL BYPASS GRAFT;  Surgeon: Katha Cabal, MD;  Location: [facility];  Service: Vascular;  Laterality: N/A;  . BACK SURGERY  2005  . [place]  . COLONOSCOPY WITH PROPOFOL N/A 12/30/2017   Procedure: COLONOSCOPY WITH PROPOFOL;  Surgeon: Toledo, Benay Pike, MD;  Location: [facility];  Service: Gastroenterology;  Laterality: N/A;  . ENDARTERECTOMY FEMORAL Bilateral 11/08/2015   Procedure: ENDARTERECTOMY FEMORAL;  Surgeon: Katha Cabal, MD;  Location: [facility];  Service: Vascular;  Laterality: Bilateral;  common, SFA, and profundis  Aortic endarterectomy   . ESOPHAGOGASTRODUODENOSCOPY (EGD) WITH PROPOFOL N/A 09/04/2015   Procedure: ESOPHAGOGASTRODUODENOSCOPY (EGD) WITH PROPOFOL;  Surgeon: Lucilla Lame, MD;  Location: [facility];  Service: Endoscopy;  Laterality: N/A;  . LUMBAR LAMINECTOMY  2006  . TEE WITHOUT CARDIOVERSION N/A 09/22/2015   Procedure: TRANSESOPHAGEAL ECHOCARDIOGRAM (TEE);  Surgeon: Teodoro Spray, MD;  Location: [facility];  Service: Cardiovascular;  Laterality: N/A;    Social History Social History   Tobacco Use  . Smoking status: Current Every Day Smoker    Packs/day: 1.00    Years: 45.00    Pack years: 45.00    Types: Cigarettes  . Smokeless tobacco: Never Used  Substance Use Topics  . Alcohol use: Yes    Comment: rare 3 a year  . Drug use: No    Family History Family History  Problem Relation Age of Onset  . Heart attack  Mother   . Hypertension Mother   . Varicose Veins Mother   . Diabetes Father   . Heart attack Father   . Heart attack Maternal Grandmother   . Stroke Maternal Grandmother     No Known Allergies   REVIEW OF SYSTEMS (Negative unless checked)  Constitutional: _0 Weight loss  _1 Fever  _2 Chills Cardiac: _3 Chest pain   _4 Chest pressure   _5 Palpitations   _6 Shortness of breath when laying flat   _7 Shortness of breath with exertion. Vascular:  _8 Pain in legs with walking   _9 Pain in legs  at rest  _10 History of DVT   _11 Phlebitis   _12 Swelling in legs   _13 Varicose veins   _14 Non-healing ulcers Pulmonary:   _15 Uses home oxygen   _16 Productive cough   _17 Hemoptysis   _18 Wheeze  _19 COPD   _20 Asthma Neurologic:  _21 Dizziness   _22 Seizures   _23 History of stroke   _24 History of TIA  _25 Aphasia   _26 Vissual changes   _27 Weakness or numbness in arm   _28 Weakness or numbness in leg Musculoskeletal:   _29 Joint swelling   _30 Joint pain   _31 Low back pain Hematologic:  _32 Easy bruising  _33 Easy bleeding   _34 Hypercoagulable state   _35 Anemic Gastrointestinal:  _36 Diarrhea   _37 Vomiting  _38 Gastroesophageal reflux/heartburn   _39 Difficulty swallowing. Genitourinary:  _40 Chronic kidney disease   _41 Difficult urination  _42 Frequent urination   _43 Blood in urine Skin:  _44 Rashes   _45 Ulcers  Psychological:  _46 History of anxiety   _47  History of major depression.  Physical Examination  There were no vitals filed for this visit. There is no height or weight on file to calculate BMI. Gen: WD/WN, NAD Head: [place]/AT, No temporalis wasting.  Ear/Nose/Throat: Hearing grossly intact, nares w/o erythema or drainage Eyes: PER, EOMI, sclera nonicteric.  Neck: Supple, no large masses.   Pulmonary:  Good air movement, no audible wheezing bilaterally, no use of accessory muscles.  Cardiac: RRR, no JVD Vascular: no left carotid bruit Gastrointestinal: Non-distended. No guarding/no peritoneal signs.  Musculoskeletal: M/S 5/5 throughout.  No deformity or atrophy.  Neurologic: CN 2-12 intact. Symmetrical.  Speech is fluent. Motor exam as listed above. Psychiatric: Judgment intact, Mood & affect appropriate for pt's clinical situation. Dermatologic: No rashes or ulcers noted.  No changes consistent with cellulitis.   CBC Lab Results  Component Value Date   WBC 6.8 02/26/2018   HGB 15.3 02/26/2018   HCT 46.6 02/26/2018   MCV 91.2 02/26/2018   PLT 209 02/26/2018    BMET    Component Value Date/Time   NA 137 02/26/2018 1359   K  4.1 02/26/2018 1359   CL 105 02/26/2018 1359   CO2 25 02/26/2018 1359   GLUCOSE 118 (H) 02/26/2018 1359   BUN 25 (H) 02/26/2018 1359   CREATININE 1.69 (H) 02/26/2018 1359   CALCIUM 8.9 02/26/2018 1359   GFRNONAA 41 (L) 02/26/2018 1359   GFRAA 48 (L) 02/26/2018 1359   CrCl cannot be calculated (Patient's most recent lab result is older than the maximum 21 days allowed.).  COAG Lab Results  Component Value Date   INR 2.24 02/26/2018   INR 1.21 11/08/2015   INR 1.37 10/24/2015    Radiology No results found.    Assessment/Plan 1. Bilateral carotid artery stenosis Given the patient's asymptomatic subcritical stenosis no further invasive testing or surgery at this time.  Duplex ultrasound shows <50% stenosis RICA and functional occlusion of the LICA, no significant change compared to last year.  Continue antiplatelet therapy as prescribed Continue management of CAD, HTN and  Hyperlipidemia Healthy heart diet,  encouraged exercise at least 4 times per week Follow up in 12 months with duplex ultrasound and physical exam  - VAS US CAROTID; Future  2. Peripheral vascular disease ([place]) Recommend:  The patient has evidence of atherosclerosis of the lower extremities with claudication.  The patient does not voice lifestyle limiting changes at this point in time.  Noninvasive studies do not suggest clinically significant change.  No invasive studies, angiography or surgery at this time The patient should continue walking and begin a more formal exercise program.  The patient should continue antiplatelet therapy and aggressive treatment of the lipid abnormalities  No changes in the patient's medications at this time - VAS US AORTA/IVC/ILIACS; Future - VAS [place] ABI WITH/WO TBI; Future  3. Benign essential HTN Continue antihypertensive medications as already ordered, these medications have been reviewed and there are no changes at this time.   4. Hyperlipidemia, unspecified  hyperlipidemia type Continue statin as ordered and reviewed, no changes at this time   5. Degeneration of intervertebral disc of lumbar region Continue NSAID medications as already ordered, these medications have been reviewed and there are no changes at this time.  Continued activity and therapy was stressed.    Hortencia Pilar, MD  04/15/2019 8:35 AM

## 2019-10-15 ENCOUNTER — Other Ambulatory Visit: Payer: Self-pay | Admitting: Internal Medicine

## 2019-10-15 ENCOUNTER — Other Ambulatory Visit: Payer: Self-pay

## 2019-10-15 ENCOUNTER — Ambulatory Visit
Admission: RE | Admit: 2019-10-15 | Discharge: 2019-10-15 | Disposition: A | Discharge status: Home / Self Care | Payer: Managed Care, Other (non HMO) | Source: Ambulatory Visit | Attending: Internal Medicine | Admitting: Internal Medicine

## 2019-10-15 DIAGNOSIS — K67 Disorders of peritoneum in infectious diseases classified elsewhere: Secondary | ICD-10-CM | POA: Diagnosis present

## 2019-10-15 DIAGNOSIS — R1084 Generalized abdominal pain: Secondary | ICD-10-CM

## 2020-04-17 ENCOUNTER — Ambulatory Visit (INDEPENDENT_AMBULATORY_CARE_PROVIDER_SITE_OTHER): Payer: Medicare HMO | Admitting: Vascular Surgery

## 2020-04-17 ENCOUNTER — Ambulatory Visit (INDEPENDENT_AMBULATORY_CARE_PROVIDER_SITE_OTHER): Payer: Medicare HMO

## 2020-04-17 ENCOUNTER — Encounter (INDEPENDENT_AMBULATORY_CARE_PROVIDER_SITE_OTHER): Payer: Self-pay | Admitting: *Deleted

## 2020-04-17 ENCOUNTER — Other Ambulatory Visit: Payer: Self-pay

## 2020-04-17 DIAGNOSIS — I6523 Occlusion and stenosis of bilateral carotid arteries: Secondary | ICD-10-CM

## 2020-04-17 DIAGNOSIS — I739 Peripheral vascular disease, unspecified: Secondary | ICD-10-CM

## 2020-07-04 DIAGNOSIS — E538 Deficiency of other specified B group vitamins: Secondary | ICD-10-CM | POA: Diagnosis not present

## 2020-07-04 DIAGNOSIS — E782 Mixed hyperlipidemia: Secondary | ICD-10-CM | POA: Diagnosis not present

## 2020-07-11 DIAGNOSIS — N1832 Chronic kidney disease, stage 3b: Secondary | ICD-10-CM | POA: Diagnosis not present

## 2020-07-11 DIAGNOSIS — J3489 Other specified disorders of nose and nasal sinuses: Secondary | ICD-10-CM | POA: Diagnosis not present

## 2020-07-11 DIAGNOSIS — I739 Peripheral vascular disease, unspecified: Secondary | ICD-10-CM | POA: Diagnosis not present

## 2020-07-11 DIAGNOSIS — Z125 Encounter for screening for malignant neoplasm of prostate: Secondary | ICD-10-CM | POA: Diagnosis not present

## 2020-07-11 DIAGNOSIS — Z0000 Encounter for general adult medical examination without abnormal findings: Secondary | ICD-10-CM | POA: Diagnosis not present

## 2020-07-11 DIAGNOSIS — Z8673 Personal history of transient ischemic attack (TIA), and cerebral infarction without residual deficits: Secondary | ICD-10-CM | POA: Diagnosis not present

## 2020-08-28 ENCOUNTER — Other Ambulatory Visit: Payer: Self-pay | Admitting: Otolaryngology

## 2020-08-28 ENCOUNTER — Other Ambulatory Visit (HOSPITAL_COMMUNITY): Payer: Self-pay | Admitting: Otolaryngology

## 2020-08-28 DIAGNOSIS — J019 Acute sinusitis, unspecified: Secondary | ICD-10-CM

## 2020-08-28 DIAGNOSIS — J342 Deviated nasal septum: Secondary | ICD-10-CM | POA: Diagnosis not present

## 2020-08-28 DIAGNOSIS — J018 Other acute sinusitis: Secondary | ICD-10-CM | POA: Diagnosis not present

## 2020-08-28 DIAGNOSIS — J3489 Other specified disorders of nose and nasal sinuses: Secondary | ICD-10-CM | POA: Diagnosis not present

## 2020-09-08 ENCOUNTER — Ambulatory Visit
Admission: RE | Admit: 2020-09-08 | Discharge: 2020-09-08 | Disposition: A | Discharge status: Home / Self Care | Payer: Medicare HMO | Source: Ambulatory Visit | Attending: Otolaryngology | Admitting: Otolaryngology

## 2020-09-08 ENCOUNTER — Other Ambulatory Visit: Payer: Self-pay

## 2020-09-08 DIAGNOSIS — J019 Acute sinusitis, unspecified: Secondary | ICD-10-CM | POA: Diagnosis not present

## 2020-09-08 DIAGNOSIS — Z01818 Encounter for other preprocedural examination: Secondary | ICD-10-CM | POA: Diagnosis not present

## 2020-09-08 DIAGNOSIS — J342 Deviated nasal septum: Secondary | ICD-10-CM | POA: Diagnosis not present

## 2020-09-20 DIAGNOSIS — J342 Deviated nasal septum: Secondary | ICD-10-CM | POA: Diagnosis not present

## 2020-09-20 DIAGNOSIS — J3489 Other specified disorders of nose and nasal sinuses: Secondary | ICD-10-CM | POA: Diagnosis not present

## 2020-09-21 ENCOUNTER — Encounter: Payer: Self-pay | Admitting: Otolaryngology

## 2020-09-25 NOTE — Discharge Instructions (Signed)
[place] [facility]  What is Functional Endoscopic Sinus Surgery?  The Surgery involves making the natural openings of the sinuses larger by removing the bony partitions that separate the sinuses from the nasal cavity.  The natural sinus lining is preserved as much as possible to allow the sinuses to resume normal function after the surgery.  In some patients nasal polyps (excessively swollen lining of the sinuses) may be removed to relieve obstruction of the sinus openings.  The surgery is performed through the nose using lighted scopes, which eliminates the need for incisions on the face.  A septoplasty is a different procedure which is sometimes performed with sinus surgery.  It involves straightening the boy partition that separates the two sides of your nose.  A crooked or deviated septum may need repair if is obstructing the sinuses or nasal airflow.  Turbinate reduction is also often performed during sinus surgery.  The turbinates are bony proturberances from the side walls of the nose which swell and can obstruct the nose in patients with sinus and allergy problems.  Their size can be surgically reduced to help relieve nasal obstruction.  What Can Sinus Surgery Do For Me?  Sinus surgery can reduce the frequency of sinus infections requiring antibiotic treatment.  This can provide improvement in nasal congestion, post-nasal drainage, facial pressure and nasal obstruction.  Surgery will NOT prevent you from ever having an infection again, so it usually only for patients who get infections 4 or more times yearly requiring antibiotics, or for infections that do not clear with antibiotics.  It will not cure nasal allergies, so patients with allergies may still require medication to treat their allergies after surgery. Surgery may improve headaches related to sinusitis, however, some people will continue to  require medication to control sinus headaches related to allergies.  Surgery will do nothing for other forms of headache (migraine, tension or cluster).  What Are the Risks of Endoscopic Sinus Surgery?  Current techniques allow surgery to be performed safely with little risk, however, there are rare complications that patients should be aware of.  Because the sinuses are located around the eyes, there is risk of eye injury, including blindness, though again, this would be quite rare. This is usually a result of bleeding behind the eye during surgery, which puts the vision oat risk, though there are treatments to protect the vision and prevent permanent disrupted by surgery causing a leak of the spinal fluid that surrounds the brain.  More serious complications would include bleeding inside the brain cavity or damage to the brain.  Again, all of these complications are uncommon, and spinal fluid leaks can be safely managed surgically if they occur.  The most common complication of sinus surgery is bleeding from the nose, which may require packing or cauterization of the nose.  Continued sinus have polyps may experience recurrence of the polyps requiring revision surgery.  Alterations of sense of smell or injury to the tear ducts are also rare complications.   What is the Surgery Like, and what is the Recovery?  The Surgery usually takes a couple of hours to perform, and is usually performed under a general anesthetic (completely asleep).  Patients are usually discharged home after a couple of hours.  Sometimes during surgery it is necessary to pack the nose to control bleeding, and the packing is left in place for 24 - 48 hours, and removed by your surgeon.  If a septoplasty was performed during the procedure, there is often a splint placed which must be removed after 5-7 days.   Discomfort: Pain is usually mild to moderate, and can be controlled by prescription pain medication or acetaminophen (Tylenol).   Aspirin, Ibuprofen (Advil, Motrin), or Naprosyn (Aleve) should be avoided, as they can cause increased bleeding.  Most patients feel sinus pressure like they have a bad head cold for several days.  Sleeping with your head elevated can help reduce swelling and facial pressure, as can ice packs over the face.  A humidifier may be helpful to keep the mucous and blood from drying in the nose.   Diet: There are no specific diet restrictions, however, you should generally start with clear liquids and a light diet of bland foods because the anesthetic can cause some nausea.  Advance your diet depending on how your stomach feels.  Taking your pain medication with food will often help reduce stomach upset which pain medications can cause.  Nasal Saline Irrigation: It is important to remove blood clots and dried mucous from the nose as it is healing.  This is done by having you irrigate the nose at least 3 - 4 times daily with a salt water solution.  We recommend using NeilMed Sinus Rinse (available at the drug store).  Fill the squeeze bottle with the solution, bend over a sink, and insert the tip of the squeeze bottle into the nose  of an inch.  Point the tip of the squeeze bottle towards the inside corner of the eye on the same side your irrigating.  Squeeze the bottle and gently irrigate the nose.  If you bend forward as you do this, most of the fluid will flow back out of the nose, instead of down your throat.   The solution should be warm, near body temperature, when you irrigate.   Each time you irrigate, you should use a full squeeze bottle.   Note that if you are instructed to use Nasal Steroid Sprays at any time after your surgery, irrigate with saline BEFORE using the steroid spray, so you do not wash it all out of the nose. Another product, Nasal Saline Gel (such as AYR Nasal Saline Gel) can be applied in each nostril 3 - 4 times daily to moisture the nose and reduce scabbing or crusting.  Bleeding:   Bloody drainage from the nose can be expected for several days, and patients are instructed to irrigate their nose frequently with salt water to help remove mucous and blood clots.  The drainage may be dark red or brown, though some fresh blood may be seen intermittently, especially after irrigation.  Do not blow you nose, as bleeding may occur. If you must sneeze, keep your mouth open to allow air to escape through your mouth.  If heavy bleeding occurs: Irrigate the nose with saline to rinse out clots, then spray the nose 3 - 4 times with Afrin Nasal Decongestant Spray.  The spray will constrict the blood vessels to slow bleeding.  Pinch the lower half of your nose shut to apply pressure, and lay down with your head elevated.  Ice packs over the nose may help as well. If bleeding persists despite these measures, you should notify your doctor.  Do not use the Afrin routinely to control nasal congestion after surgery, as it can result in worsening congestion and may affect healing.     Activity: Return to work varies among patients. Most patients will be  out of work at least 5 - 7 days to recover.  Patient may return to work after they are off of narcotic pain medication, and feeling well enough to perform the functions of their job.  Patients must avoid heavy lifting (over 10 pounds) or strenuous physical for 2 weeks after surgery, so your employer may need to assign you to light duty, or keep you out of work longer if light duty is not possible.  NOTE: you should not drive, operate dangerous machinery, do any mentally demanding tasks or make any important legal or financial decisions while on narcotic pain medication and recovering from the general anesthetic.    Call Your Doctor Immediately if You Have Any of the Following: Bleeding that you cannot control with the above measures Loss of vision, double vision, bulging of the eye or black eyes. Fever over 101 degrees Neck stiffness with severe headache,  fever, nausea and change in mental state. You are always encourage to call anytime with concerns, however, please call with requests for pain medication refills during office hours.  Office Endoscopy: During follow-up visits your doctor will remove any packing or splints that may have been placed and evaluate and clean your sinuses endoscopically.  Topical anesthetic will be used to make this as comfortable as possible, though you may want to take your pain medication prior to the visit.  How often this will need to be done varies from patient to patient.  After complete recovery from the surgery, you may need follow-up endoscopy from time to time, particularly if there is concern of recurrent infection or nasal polyps.

## 2020-09-28 ENCOUNTER — Other Ambulatory Visit: Payer: Self-pay

## 2020-09-28 ENCOUNTER — Ambulatory Visit: Payer: Medicare HMO | Admitting: Anesthesiology

## 2020-09-28 ENCOUNTER — Ambulatory Visit
Admission: RE | Admit: 2020-09-28 | Discharge: 2020-09-28 | Disposition: A | Discharge status: Home / Self Care | Payer: Medicare HMO | Attending: Otolaryngology | Admitting: Otolaryngology

## 2020-09-28 ENCOUNTER — Encounter
Admission: RE | Disposition: A | Discharge status: Home / Self Care | Payer: Self-pay | Source: Home / Self Care | Attending: Otolaryngology

## 2020-09-28 ENCOUNTER — Encounter: Payer: Self-pay | Admitting: Otolaryngology

## 2020-09-28 DIAGNOSIS — Z7982 Long term (current) use of aspirin: Secondary | ICD-10-CM | POA: Insufficient documentation

## 2020-09-28 DIAGNOSIS — Z7901 Long term (current) use of anticoagulants: Secondary | ICD-10-CM | POA: Diagnosis not present

## 2020-09-28 DIAGNOSIS — F17290 Nicotine dependence, other tobacco product, uncomplicated: Secondary | ICD-10-CM | POA: Diagnosis not present

## 2020-09-28 DIAGNOSIS — J343 Hypertrophy of nasal turbinates: Secondary | ICD-10-CM | POA: Diagnosis not present

## 2020-09-28 DIAGNOSIS — Z791 Long term (current) use of non-steroidal anti-inflammatories (NSAID): Secondary | ICD-10-CM | POA: Diagnosis not present

## 2020-09-28 DIAGNOSIS — J342 Deviated nasal septum: Secondary | ICD-10-CM | POA: Insufficient documentation

## 2020-09-28 DIAGNOSIS — Z79899 Other long term (current) drug therapy: Secondary | ICD-10-CM | POA: Insufficient documentation

## 2020-09-28 HISTORY — DX: Other complications of anesthesia, initial encounter: T88.59XA

## 2020-09-28 HISTORY — PX: TURBINATE REDUCTION: SHX6157

## 2020-09-28 HISTORY — PX: SEPTOPLASTY: SHX2393

## 2020-09-28 SURGERY — SEPTOPLASTY, NOSE
Anesthesia: General | Site: Nose

## 2020-09-28 MED ORDER — ONDANSETRON HCL 4 MG/2ML IJ SOLN
INTRAMUSCULAR | Status: DC | PRN
  Administered 2020-09-28: 4 mg via INTRAVENOUS

## 2020-09-28 MED ORDER — PHENYLEPHRINE HCL 0.5 % NA SOLN: NASAL | Status: DC | PRN

## 2020-09-28 MED ORDER — ONDANSETRON HCL 4 MG/2ML IJ SOLN: 4.0000 mg | Freq: Once | INTRAMUSCULAR | Status: DC | PRN

## 2020-09-28 MED ORDER — ACETAMINOPHEN 325 MG PO TABS: 325.0000 mg | ORAL_TABLET | ORAL | Status: DC | PRN

## 2020-09-28 MED ORDER — LACTATED RINGERS IV SOLN: INTRAVENOUS | Status: DC

## 2020-09-28 MED ORDER — HYDROMORPHONE HCL 1 MG/ML IJ SOLN: 0.2500 mg | INTRAMUSCULAR | Status: DC | PRN

## 2020-09-28 MED ORDER — DEXAMETHASONE SODIUM PHOSPHATE 4 MG/ML IJ SOLN
INTRAMUSCULAR | Status: DC | PRN
  Administered 2020-09-28: 10 mg via INTRAVENOUS

## 2020-09-28 MED ORDER — HYDROCODONE-ACETAMINOPHEN 5-325 MG PO TABS: 1.0000 | ORAL_TABLET | Freq: Four times a day (QID) | ORAL | 0 refills | Status: AC | PRN

## 2020-09-28 MED ORDER — LIDOCAINE HCL (CARDIAC) PF 100 MG/5ML IV SOSY
PREFILLED_SYRINGE | INTRAVENOUS | Status: DC | PRN
  Administered 2020-09-28: 40 mg via INTRAVENOUS

## 2020-09-28 MED ORDER — DEXTROSE 5 % IV SOLN: 2000.0000 mg | Freq: Once | INTRAVENOUS | Status: DC

## 2020-09-28 MED ORDER — CEPHALEXIN 500 MG PO CAPS
500.0000 mg | ORAL_CAPSULE | Freq: Two times a day (BID) | ORAL | 0 refills | Status: DC
Start: 2020-09-28 — End: 2021-02-06

## 2020-09-28 MED ORDER — MIDAZOLAM HCL 5 MG/5ML IJ SOLN
INTRAMUSCULAR | Status: DC | PRN
  Administered 2020-09-28: 2 mg via INTRAVENOUS

## 2020-09-28 MED ORDER — FENTANYL CITRATE (PF) 100 MCG/2ML IJ SOLN
INTRAMUSCULAR | Status: DC | PRN
  Administered 2020-09-28: 50 ug via INTRAVENOUS

## 2020-09-28 MED ORDER — PROPOFOL 10 MG/ML IV BOLUS
INTRAVENOUS | Status: DC | PRN
  Administered 2020-09-28: 120 mg via INTRAVENOUS
  Administered 2020-09-28: 40 mg via INTRAVENOUS

## 2020-09-28 MED ORDER — SUCCINYLCHOLINE CHLORIDE 20 MG/ML IJ SOLN
INTRAMUSCULAR | Status: DC | PRN
  Administered 2020-09-28: 100 mg via INTRAVENOUS

## 2020-09-28 MED ORDER — ACETAMINOPHEN 160 MG/5ML PO SOLN: 325.0000 mg | ORAL | Status: DC | PRN

## 2020-09-28 MED ORDER — GLYCOPYRROLATE 0.2 MG/ML IJ SOLN
INTRAMUSCULAR | Status: DC | PRN
  Administered 2020-09-28: .1 mg via INTRAVENOUS

## 2020-09-28 MED ORDER — OXYCODONE HCL 5 MG PO TABS: 5.0000 mg | ORAL_TABLET | Freq: Once | ORAL | Status: DC | PRN

## 2020-09-28 MED ORDER — PREDNISONE 10 MG PO TABS: ORAL_TABLET | ORAL | 0 refills | Status: DC

## 2020-09-28 MED ORDER — EPHEDRINE SULFATE 50 MG/ML IJ SOLN
INTRAMUSCULAR | Status: DC | PRN
  Administered 2020-09-28: 10 mg via INTRAVENOUS

## 2020-09-28 MED ORDER — OXYCODONE HCL 5 MG/5ML PO SOLN
5.0000 mg | Freq: Once | ORAL | Status: DC | PRN
Start: 2020-09-28 — End: 2020-09-28

## 2020-09-28 MED ORDER — OXYMETAZOLINE HCL 0.05 % NA SOLN
2.0000 | Freq: Once | NASAL | Status: AC
  Administered 2020-09-28: 2 via NASAL

## 2020-09-28 MED ORDER — LIDOCAINE-EPINEPHRINE 1 %-1:100000 IJ SOLN
INTRAMUSCULAR | Status: DC | PRN
  Administered 2020-09-28: 6 mL

## 2020-09-28 SURGICAL SUPPLY — 25 items
CANISTER SUCT 1200ML W/VALVE (MISCELLANEOUS) ×3 IMPLANT
COAGULATOR SUCT 8FR VV (MISCELLANEOUS) ×3 IMPLANT
ELECT REM PT RETURN 9FT ADLT (ELECTROSURGICAL) ×3
ELECTRODE REM PT RTRN 9FT ADLT (ELECTROSURGICAL) ×2 IMPLANT
GLOVE SURG GAMMEX PI TX LF 7.5 (GLOVE) ×1 IMPLANT
GOWN STRL REUS W/ TWL LRG LVL3 (GOWN DISPOSABLE) ×2 IMPLANT
GOWN STRL REUS W/TWL LRG LVL3 (GOWN DISPOSABLE) ×3
KIT TURNOVER KIT A (KITS) ×3 IMPLANT
NDL ANESTHESIA 27G X 3.5 (NEEDLE) ×2 IMPLANT
NDL HYPO 27GX1-1/4 (NEEDLE) ×2 IMPLANT
NEEDLE ANESTHESIA  27G X 3.5 (NEEDLE) ×1
NEEDLE ANESTHESIA 27G X 3.5 (NEEDLE) ×2 IMPLANT
NEEDLE HYPO 27GX1-1/4 (NEEDLE) ×3 IMPLANT
PACK ENT CUSTOM (PACKS) ×3 IMPLANT
PATTIES SURGICAL .5 X3 (DISPOSABLE) ×3 IMPLANT
SPLINT NASAL SEPTAL BLV .50 ST (MISCELLANEOUS) ×3 IMPLANT
STRAP BODY AND KNEE 60X3 (MISCELLANEOUS) ×3 IMPLANT
SUT CHROMIC 3-0 (SUTURE)
SUT CHROMIC 3-0 KS 27XMFL CR (SUTURE)
SUT ETHILON 3-0 KS 30 BLK (SUTURE) ×3 IMPLANT
SUT PLAIN GUT 4-0 (SUTURE) ×3 IMPLANT
SUTURE CHRMC 3-0 KS 27XMFL CR (SUTURE) IMPLANT
SYR 3ML LL SCALE MARK (SYRINGE) ×3 IMPLANT
TOWEL OR 17X26 4PK STRL BLUE (TOWEL DISPOSABLE) ×3 IMPLANT
WATER STERILE IRR 250ML POUR (IV SOLUTION) ×3 IMPLANT

## 2020-09-28 NOTE — Anesthesia Preprocedure Evaluation (Signed)
Anesthesia Evaluation  Patient identified by MRN, date of birth, ID band Patient awake    Reviewed: Allergy & Precautions, H&P , NPO status , Patient's Chart, lab work & pertinent test results, reviewed documented beta blocker date and time   Airway Mallampati: II  TM Distance: >3 FB Neck ROM: full    Dental  (+) Edentulous Upper, Edentulous Lower   Pulmonary Current Smoker and Patient abstained from smoking.,    Pulmonary exam normal breath sounds clear to auscultation       Cardiovascular Exercise Tolerance: Good hypertension, + Peripheral Vascular Disease  Normal cardiovascular exam Rhythm:regular Rate:Normal     Neuro/Psych CVA negative psych ROS   GI/Hepatic Neg liver ROS, PUD, PUD 5 years ago, no trouble now.   Endo/Other  negative endocrine ROS  Renal/GU CRFRenal disease  negative genitourinary   Musculoskeletal   Abdominal   Peds  Hematology negative hematology ROS (+)   Anesthesia Other Findings   Reproductive/Obstetrics negative OB ROS                             Anesthesia Physical Anesthesia Plan  ASA: 3  Anesthesia Plan: General   Post-op Pain Management:    Induction:   PONV Risk Score and Plan: Ondansetron  Airway Management Planned:   Additional Equipment:   Intra-op Plan:   Post-operative Plan:   Informed Consent: I have reviewed the patients History and Physical, chart, labs and discussed the procedure including the risks, benefits and alternatives for the proposed anesthesia with the patient or authorized representative who has indicated his/her understanding and acceptance.     Dental Advisory Given  Plan Discussed with: CRNA and Anesthesiologist  Anesthesia Plan Comments:         Anesthesia Quick Evaluation

## 2020-09-28 NOTE — Anesthesia Postprocedure Evaluation (Signed)
Anesthesia Post Note  Patient: Gary Mccoy.  Procedure(s) Performed: SEPTOPLASTY (Nose) TURBINATE REDUCTION (Bilateral: Nose)     Patient location during evaluation: PACU Anesthesia Type: General Level of consciousness: awake and alert Pain management: pain level controlled Vital Signs Assessment: post-procedure vital signs reviewed and stable Respiratory status: spontaneous breathing, nonlabored ventilation, respiratory function stable and patient connected to nasal cannula oxygen Cardiovascular status: blood pressure returned to baseline and stable Postop Assessment: no apparent nausea or vomiting Anesthetic complications: no   No notable events documented.  Trecia Rogers

## 2020-09-28 NOTE — H&P (Signed)
H&P has been reviewed and patient reevaluated, no changes necessary. To be downloaded later.

## 2020-09-28 NOTE — Anesthesia Procedure Notes (Signed)
Procedure Name: Intubation Date/Time: 09/28/2020 7:44 AM Performed by: Mayme Genta, CRNA Pre-anesthesia Checklist: Patient identified, Emergency Drugs available, Suction available, Patient being monitored and Timeout performed Patient Re-evaluated:Patient Re-evaluated prior to induction Oxygen Delivery Method: Circle system utilized Preoxygenation: Pre-oxygenation with 100% oxygen Induction Type: IV induction Ventilation: Mask ventilation without difficulty Laryngoscope Size: Miller and 3 Grade View: Grade I Tube type: Oral Rae Tube size: 8.0 mm Number of attempts: 1 Placement Confirmation: ETT inserted through vocal cords under direct vision, positive ETCO2 and breath sounds checked- equal and bilateral Tube secured with: Tape Dental Injury: Teeth and Oropharynx as per pre-operative assessment

## 2020-09-28 NOTE — Transfer of Care (Signed)
Immediate Anesthesia Transfer of Care Note  Patient: Gary Mccoy.  Procedure(s) Performed: SEPTOPLASTY (Nose) TURBINATE REDUCTION (Bilateral: Nose)  Patient Location: PACU  Anesthesia Type: General  Level of Consciousness: awake, alert  and patient cooperative  Airway and Oxygen Therapy: Patient Spontanous Breathing and Patient connected to supplemental oxygen  Post-op Assessment: Post-op Vital signs reviewed, Patient's Cardiovascular Status Stable, Respiratory Function Stable, Patent Airway and No signs of Nausea or vomiting  Post-op Vital Signs: Reviewed and stable  Complications: No notable events documented.

## 2020-09-28 NOTE — Op Note (Signed)
09/28/2020  8:50 AM  [id number]   Pre-Op Dx:  Deviated Nasal Septum, Hypertrophic Inferior Turbinates  Post-op Dx: Same  Proc: Nasal Septoplasty, Bilateral Partial Reduction Inferior Turbinates   Surg:  Elon Alas Naythan Douthit  Anes:  GOT  EBL: 50 mL  Comp: None  Findings: Septum buckled anteriorly where the cartilages folded on itself.  The caudal end buckles into the left nostril blocking the nasal valve area.  There is a cartilage overhanging a maxillary crest spur on the right side inferiorly.  The inferior turbinates are enlarged, especially the right side.  Procedure: With the patient in a comfortable supine position,  general orotracheal anesthesia was induced without difficulty.     The patient received preoperative Afrin spray for topical decongestion and vasoconstriction.  Intravenous prophylactic antibiotics were administered.  At an appropriate level, the patient was placed in a semi-sitting position.  Nasal vibrissae were trimmed.   1% Xylocaine with 1:100,000 epinephrine, 7 cc's, was infiltrated into the anterior floor of the nose, into the nasal spine region, into the membranous columella, and finally into the submucoperichondrial plane of the septum on both sides.  Several minutes were allowed for this to take effect.  Cottoniod pledgetts soaked in Afrin and 4% Xylocaine were placed into both nasal cavities and left while the patient was prepped and draped in the standard fashion.  The materials were removed from the nose and observed to be intact and correct in number.  The nose was inspected with a headlight and zero degree scope with the findings as described above.  A left hemitransfixion incision was sharply executed and carried down to the quadrangular cartilage. The mucoperichondrium was elelvated along the quadrangular plate back to the bony-cartilaginous junction on both sides of the quadrangular cartilage. The mucoperiostium was then elevated along the ethmoid plate and  the vomer. The boney-catilaginous junction was then split with a freer elevator and the mucoperiosteum was elevated on the opposite side. The mucoperiosteum was then elevated along the maxillary crest as needed to expose the crooked bone of the crest.  Boney spurs of the vomer and maxillary crest were removed with Donavan Foil forceps and a chisel was used for the spur on the right side.  The cartilaginous plate was trimmed along its posterior and inferior borders of about 2 mm of cartilage to free it up inferiorly. Some of the deviated ethmoid plate was then fractured and removed with Takahashi forceps to free up the posterior border of the quadrangular plate and allow it to swing back to the midline.  Caudal tip of the septum was trimmed of about 2 mm of twisted cartilage.  The mucosal flaps were placed back into their anatomic position to allow visualization of the airways. The septum now sat in the midline with an improved airway.  A 3-0 Chromic suture on a Keith needle in used to anchor the inferior septum at the nasal spine with a through and through suture. The mucosal flaps are then sutured together using a through and through whip stitch of 4-0 Plain Gut with a mini-Keith needle. This was used to close the hemitransfixion incision as well.   The inferior turbinates were then inspected. An incision was created along the inferior aspect of the left inferior turbinate with removal of some of the inferior soft tissue and bone. Electrocautery was used to control bleeding in the area. The remaining turbinate was then outfractured to open up the airway further. There was no significant bleeding noted. The right turbinate was then  trimmed and outfractured in a similar fashion.  The airways were then visualized and showed open passageways on both sides that were significantly improved compared to before surgery. There was no signifcant bleeding. Nasal splints were applied to both sides of the septum using Xomed  0.20m regular sized splints that were trimmed, and then held in position with a 3-0 Nylon through and through suture.  The patient was turned back over to anesthesia, and awakened, extubated, and taken to the PACU in satisfactory condition.  Dispo:   PACU to home  Plan: Ice, elevation, narcotic analgesia, steroid taper, and prophylactic antibiotics for the duration of indwelling nasal foreign bodies.  We will reevaluate the patient in the office in 6 days and remove the septal splints.  Return to work in 10 days, strenuous activities in two weeks.   PElon AlasJuengel 09/28/2020 8:50 AM

## 2020-09-29 ENCOUNTER — Encounter: Payer: Self-pay | Admitting: Otolaryngology

## 2020-10-03 DIAGNOSIS — Z48813 Encounter for surgical aftercare following surgery on the respiratory system: Secondary | ICD-10-CM | POA: Diagnosis not present

## 2020-10-10 DIAGNOSIS — Z48813 Encounter for surgical aftercare following surgery on the respiratory system: Secondary | ICD-10-CM | POA: Diagnosis not present

## 2020-10-30 DIAGNOSIS — Z48813 Encounter for surgical aftercare following surgery on the respiratory system: Secondary | ICD-10-CM | POA: Diagnosis not present

## 2020-11-18 DIAGNOSIS — J069 Acute upper respiratory infection, unspecified: Secondary | ICD-10-CM | POA: Diagnosis not present

## 2020-11-18 DIAGNOSIS — Z20822 Contact with and (suspected) exposure to covid-19: Secondary | ICD-10-CM | POA: Diagnosis not present

## 2020-11-18 DIAGNOSIS — J40 Bronchitis, not specified as acute or chronic: Secondary | ICD-10-CM | POA: Diagnosis not present

## 2021-01-04 DIAGNOSIS — Z125 Encounter for screening for malignant neoplasm of prostate: Secondary | ICD-10-CM | POA: Diagnosis not present

## 2021-01-04 DIAGNOSIS — E538 Deficiency of other specified B group vitamins: Secondary | ICD-10-CM | POA: Diagnosis not present

## 2021-01-04 DIAGNOSIS — E782 Mixed hyperlipidemia: Secondary | ICD-10-CM | POA: Diagnosis not present

## 2021-01-11 ENCOUNTER — Other Ambulatory Visit: Payer: Self-pay | Admitting: Internal Medicine

## 2021-01-11 DIAGNOSIS — Z0000 Encounter for general adult medical examination without abnormal findings: Secondary | ICD-10-CM | POA: Diagnosis not present

## 2021-01-11 DIAGNOSIS — I701 Atherosclerosis of renal artery: Secondary | ICD-10-CM | POA: Diagnosis not present

## 2021-01-11 DIAGNOSIS — J441 Chronic obstructive pulmonary disease with (acute) exacerbation: Secondary | ICD-10-CM | POA: Diagnosis not present

## 2021-01-11 DIAGNOSIS — I739 Peripheral vascular disease, unspecified: Secondary | ICD-10-CM | POA: Diagnosis not present

## 2021-01-11 DIAGNOSIS — R634 Abnormal weight loss: Secondary | ICD-10-CM | POA: Diagnosis not present

## 2021-01-11 DIAGNOSIS — D500 Iron deficiency anemia secondary to blood loss (chronic): Secondary | ICD-10-CM

## 2021-01-11 DIAGNOSIS — N179 Acute kidney failure, unspecified: Secondary | ICD-10-CM | POA: Diagnosis not present

## 2021-01-17 DIAGNOSIS — K625 Hemorrhage of anus and rectum: Secondary | ICD-10-CM | POA: Diagnosis not present

## 2021-01-24 DIAGNOSIS — R195 Other fecal abnormalities: Secondary | ICD-10-CM | POA: Diagnosis not present

## 2021-01-24 DIAGNOSIS — D649 Anemia, unspecified: Secondary | ICD-10-CM | POA: Diagnosis not present

## 2021-01-29 ENCOUNTER — Inpatient Hospital Stay
Admission: EM | Admit: 2021-01-29 | Discharge: 2021-02-06 | DRG: 291 | Disposition: A | Discharge status: Home / Self Care | Payer: Medicare HMO | Attending: Internal Medicine | Admitting: Internal Medicine

## 2021-01-29 ENCOUNTER — Other Ambulatory Visit: Payer: Self-pay

## 2021-01-29 ENCOUNTER — Inpatient Hospital Stay: Payer: Medicare HMO

## 2021-01-29 ENCOUNTER — Emergency Department: Payer: Medicare HMO

## 2021-01-29 DIAGNOSIS — E860 Dehydration: Secondary | ICD-10-CM | POA: Diagnosis not present

## 2021-01-29 DIAGNOSIS — Z833 Family history of diabetes mellitus: Secondary | ICD-10-CM | POA: Diagnosis not present

## 2021-01-29 DIAGNOSIS — Z716 Tobacco abuse counseling: Secondary | ICD-10-CM | POA: Diagnosis not present

## 2021-01-29 DIAGNOSIS — I739 Peripheral vascular disease, unspecified: Secondary | ICD-10-CM | POA: Diagnosis present

## 2021-01-29 DIAGNOSIS — I248 Other forms of acute ischemic heart disease: Secondary | ICD-10-CM | POA: Diagnosis not present

## 2021-01-29 DIAGNOSIS — N1832 Chronic kidney disease, stage 3b: Secondary | ICD-10-CM | POA: Diagnosis present

## 2021-01-29 DIAGNOSIS — I5021 Acute systolic (congestive) heart failure: Secondary | ICD-10-CM | POA: Diagnosis not present

## 2021-01-29 DIAGNOSIS — K219 Gastro-esophageal reflux disease without esophagitis: Secondary | ICD-10-CM | POA: Diagnosis present

## 2021-01-29 DIAGNOSIS — I639 Cerebral infarction, unspecified: Secondary | ICD-10-CM | POA: Diagnosis present

## 2021-01-29 DIAGNOSIS — F17210 Nicotine dependence, cigarettes, uncomplicated: Secondary | ICD-10-CM | POA: Diagnosis present

## 2021-01-29 DIAGNOSIS — Z8249 Family history of ischemic heart disease and other diseases of the circulatory system: Secondary | ICD-10-CM

## 2021-01-29 DIAGNOSIS — Z8673 Personal history of transient ischemic attack (TIA), and cerebral infarction without residual deficits: Secondary | ICD-10-CM | POA: Diagnosis not present

## 2021-01-29 DIAGNOSIS — E785 Hyperlipidemia, unspecified: Secondary | ICD-10-CM | POA: Diagnosis not present

## 2021-01-29 DIAGNOSIS — T39395A Adverse effect of other nonsteroidal anti-inflammatory drugs [NSAID], initial encounter: Secondary | ICD-10-CM | POA: Diagnosis present

## 2021-01-29 DIAGNOSIS — K5730 Diverticulosis of large intestine without perforation or abscess without bleeding: Secondary | ICD-10-CM | POA: Diagnosis not present

## 2021-01-29 DIAGNOSIS — J9601 Acute respiratory failure with hypoxia: Secondary | ICD-10-CM | POA: Diagnosis not present

## 2021-01-29 DIAGNOSIS — M5136 Other intervertebral disc degeneration, lumbar region: Secondary | ICD-10-CM | POA: Diagnosis present

## 2021-01-29 DIAGNOSIS — I4891 Unspecified atrial fibrillation: Secondary | ICD-10-CM | POA: Diagnosis not present

## 2021-01-29 DIAGNOSIS — I509 Heart failure, unspecified: Secondary | ICD-10-CM

## 2021-01-29 DIAGNOSIS — K820 Obstruction of gallbladder: Secondary | ICD-10-CM | POA: Diagnosis not present

## 2021-01-29 DIAGNOSIS — I429 Cardiomyopathy, unspecified: Secondary | ICD-10-CM | POA: Diagnosis not present

## 2021-01-29 DIAGNOSIS — D649 Anemia, unspecified: Secondary | ICD-10-CM | POA: Diagnosis present

## 2021-01-29 DIAGNOSIS — N179 Acute kidney failure, unspecified: Secondary | ICD-10-CM | POA: Diagnosis not present

## 2021-01-29 DIAGNOSIS — J9811 Atelectasis: Secondary | ICD-10-CM | POA: Diagnosis not present

## 2021-01-29 DIAGNOSIS — J441 Chronic obstructive pulmonary disease with (acute) exacerbation: Secondary | ICD-10-CM | POA: Diagnosis present

## 2021-01-29 DIAGNOSIS — K921 Melena: Secondary | ICD-10-CM | POA: Diagnosis present

## 2021-01-29 DIAGNOSIS — D509 Iron deficiency anemia, unspecified: Secondary | ICD-10-CM | POA: Diagnosis not present

## 2021-01-29 DIAGNOSIS — R197 Diarrhea, unspecified: Secondary | ICD-10-CM | POA: Diagnosis not present

## 2021-01-29 DIAGNOSIS — N261 Atrophy of kidney (terminal): Secondary | ICD-10-CM | POA: Diagnosis not present

## 2021-01-29 DIAGNOSIS — I10 Essential (primary) hypertension: Secondary | ICD-10-CM | POA: Diagnosis present

## 2021-01-29 DIAGNOSIS — R778 Other specified abnormalities of plasma proteins: Secondary | ICD-10-CM | POA: Diagnosis not present

## 2021-01-29 DIAGNOSIS — R0602 Shortness of breath: Secondary | ICD-10-CM | POA: Diagnosis present

## 2021-01-29 DIAGNOSIS — I5023 Acute on chronic systolic (congestive) heart failure: Secondary | ICD-10-CM | POA: Diagnosis present

## 2021-01-29 DIAGNOSIS — K640 First degree hemorrhoids: Secondary | ICD-10-CM | POA: Diagnosis present

## 2021-01-29 DIAGNOSIS — Z66 Do not resuscitate: Secondary | ICD-10-CM | POA: Diagnosis not present

## 2021-01-29 DIAGNOSIS — R0603 Acute respiratory distress: Secondary | ICD-10-CM

## 2021-01-29 DIAGNOSIS — D500 Iron deficiency anemia secondary to blood loss (chronic): Secondary | ICD-10-CM | POA: Diagnosis not present

## 2021-01-29 DIAGNOSIS — Z7901 Long term (current) use of anticoagulants: Secondary | ICD-10-CM | POA: Diagnosis not present

## 2021-01-29 DIAGNOSIS — I130 Hypertensive heart and chronic kidney disease with heart failure and stage 1 through stage 4 chronic kidney disease, or unspecified chronic kidney disease: Secondary | ICD-10-CM | POA: Diagnosis not present

## 2021-01-29 DIAGNOSIS — K922 Gastrointestinal hemorrhage, unspecified: Secondary | ICD-10-CM | POA: Diagnosis not present

## 2021-01-29 DIAGNOSIS — Z823 Family history of stroke: Secondary | ICD-10-CM

## 2021-01-29 DIAGNOSIS — J90 Pleural effusion, not elsewhere classified: Secondary | ICD-10-CM | POA: Diagnosis not present

## 2021-01-29 DIAGNOSIS — Z7982 Long term (current) use of aspirin: Secondary | ICD-10-CM

## 2021-01-29 DIAGNOSIS — K6289 Other specified diseases of anus and rectum: Secondary | ICD-10-CM | POA: Diagnosis not present

## 2021-01-29 DIAGNOSIS — I110 Hypertensive heart disease with heart failure: Secondary | ICD-10-CM | POA: Diagnosis not present

## 2021-01-29 DIAGNOSIS — K269 Duodenal ulcer, unspecified as acute or chronic, without hemorrhage or perforation: Secondary | ICD-10-CM | POA: Diagnosis present

## 2021-01-29 DIAGNOSIS — M7989 Other specified soft tissue disorders: Secondary | ICD-10-CM | POA: Diagnosis not present

## 2021-01-29 DIAGNOSIS — I129 Hypertensive chronic kidney disease with stage 1 through stage 4 chronic kidney disease, or unspecified chronic kidney disease: Secondary | ICD-10-CM | POA: Diagnosis not present

## 2021-01-29 DIAGNOSIS — K649 Unspecified hemorrhoids: Secondary | ICD-10-CM | POA: Diagnosis not present

## 2021-01-29 DIAGNOSIS — I5043 Acute on chronic combined systolic (congestive) and diastolic (congestive) heart failure: Secondary | ICD-10-CM | POA: Diagnosis not present

## 2021-01-29 DIAGNOSIS — R319 Hematuria, unspecified: Secondary | ICD-10-CM

## 2021-01-29 DIAGNOSIS — J9621 Acute and chronic respiratory failure with hypoxia: Secondary | ICD-10-CM | POA: Diagnosis present

## 2021-01-29 DIAGNOSIS — Z20822 Contact with and (suspected) exposure to covid-19: Secondary | ICD-10-CM | POA: Diagnosis not present

## 2021-01-29 DIAGNOSIS — I5033 Acute on chronic diastolic (congestive) heart failure: Secondary | ICD-10-CM | POA: Diagnosis present

## 2021-01-29 DIAGNOSIS — Z79899 Other long term (current) drug therapy: Secondary | ICD-10-CM

## 2021-01-29 DIAGNOSIS — R55 Syncope and collapse: Secondary | ICD-10-CM | POA: Diagnosis not present

## 2021-01-29 LAB — CBC WITH DIFFERENTIAL/PLATELET
Abs Immature Granulocytes: 0.05 10*3/uL (ref 0.00–0.07)
Basophils Absolute: 0.1 10*3/uL (ref 0.0–0.1)
Basophils Relative: 1 %
Eosinophils Absolute: 0.1 10*3/uL (ref 0.0–0.5)
Eosinophils Relative: 1 %
HCT: 33.1 % — ABNORMAL LOW (ref 39.0–52.0)
Hemoglobin: 10.9 g/dL — ABNORMAL LOW (ref 13.0–17.0)
Immature Granulocytes: 1 %
Lymphocytes Relative: 6 %
Lymphs Abs: 0.6 10*3/uL — ABNORMAL LOW (ref 0.7–4.0)
MCH: 29.1 pg (ref 26.0–34.0)
MCHC: 32.9 g/dL (ref 30.0–36.0)
MCV: 88.3 fL (ref 80.0–100.0)
Monocytes Absolute: 0.8 10*3/uL (ref 0.1–1.0)
Monocytes Relative: 9 %
Neutro Abs: 7.3 10*3/uL (ref 1.7–7.7)
Neutrophils Relative %: 82 %
Platelets: 218 10*3/uL (ref 150–400)
RBC: 3.75 MIL/uL — ABNORMAL LOW (ref 4.22–5.81)
RDW: 13.8 % (ref 11.5–15.5)
WBC: 8.9 10*3/uL (ref 4.0–10.5)
nRBC: 0 % (ref 0.0–0.2)

## 2021-01-29 LAB — COMPREHENSIVE METABOLIC PANEL
ALT: 25 U/L (ref 0–44)
AST: 29 U/L (ref 15–41)
Albumin: 4.6 g/dL (ref 3.5–5.0)
Alkaline Phosphatase: 85 U/L (ref 38–126)
Anion gap: 10 (ref 5–15)
BUN: 30 mg/dL — ABNORMAL HIGH (ref 8–23)
CO2: 21 mmol/L — ABNORMAL LOW (ref 22–32)
Calcium: 9.1 mg/dL (ref 8.9–10.3)
Chloride: 108 mmol/L (ref 98–111)
Creatinine, Ser: 2.23 mg/dL — ABNORMAL HIGH (ref 0.61–1.24)
GFR, Estimated: 31 mL/min — ABNORMAL LOW (ref >60)
Glucose, Bld: 104 mg/dL — ABNORMAL HIGH (ref 70–99)
Potassium: 4.2 mmol/L (ref 3.5–5.1)
Sodium: 139 mmol/L (ref 135–145)
Total Bilirubin: 1.7 mg/dL — ABNORMAL HIGH (ref 0.3–1.2)
Total Protein: 7.9 g/dL (ref 6.5–8.1)

## 2021-01-29 LAB — TYPE AND SCREEN
ABO/RH(D): O POS
Antibody Screen: NEGATIVE

## 2021-01-29 LAB — APTT: aPTT: 52 seconds — ABNORMAL HIGH (ref 24–36)

## 2021-01-29 LAB — TROPONIN I (HIGH SENSITIVITY)
Troponin I (High Sensitivity): 126 ng/L (ref <18)
Troponin I (High Sensitivity): 145 ng/L (ref <18)

## 2021-01-29 LAB — HEPARIN LEVEL (UNFRACTIONATED): Heparin Unfractionated: >1.1 IU/mL — ABNORMAL HIGH (ref 0.30–0.70)

## 2021-01-29 LAB — PROTIME-INR
INR: 1.9 — ABNORMAL HIGH (ref 0.8–1.2)
Prothrombin Time: 21.5 seconds — ABNORMAL HIGH (ref 11.4–15.2)

## 2021-01-29 LAB — TSH: TSH: 0.72 u[IU]/mL (ref 0.350–4.500)

## 2021-01-29 LAB — BRAIN NATRIURETIC PEPTIDE: B Natriuretic Peptide: 1054.9 pg/mL — ABNORMAL HIGH (ref 0.0–100.0)

## 2021-01-29 LAB — T4, FREE: Free T4: 1.22 ng/dL — ABNORMAL HIGH (ref 0.61–1.12)

## 2021-01-29 LAB — MAGNESIUM: Magnesium: 2 mg/dL (ref 1.7–2.4)

## 2021-01-29 MED ORDER — ACETAMINOPHEN 325 MG PO TABS: 650.0000 mg | ORAL_TABLET | Freq: Four times a day (QID) | ORAL | Status: DC | PRN

## 2021-01-29 MED ORDER — IPRATROPIUM-ALBUTEROL 0.5-2.5 (3) MG/3ML IN SOLN
3.0000 mL | Freq: Once | RESPIRATORY_TRACT | Status: AC
  Administered 2021-01-29: 3 mL via RESPIRATORY_TRACT

## 2021-01-29 MED ORDER — IPRATROPIUM-ALBUTEROL 0.5-2.5 (3) MG/3ML IN SOLN
3.0000 mL | Freq: Once | RESPIRATORY_TRACT | Status: AC
  Administered 2021-01-29: 3 mL via RESPIRATORY_TRACT
  Filled 2021-01-29: qty 3

## 2021-01-29 MED ORDER — FUROSEMIDE 10 MG/ML IJ SOLN
20.0000 mg | Freq: Once | INTRAMUSCULAR | Status: DC
  Filled 2021-01-29: qty 4

## 2021-01-29 MED ORDER — ACETAMINOPHEN 650 MG RE SUPP
650.0000 mg | Freq: Four times a day (QID) | RECTAL | Status: DC | PRN
  Filled 2021-01-29: qty 1

## 2021-01-29 MED ORDER — ONDANSETRON HCL 4 MG/2ML IJ SOLN: 4.0000 mg | Freq: Four times a day (QID) | INTRAMUSCULAR | Status: DC | PRN

## 2021-01-29 MED ORDER — METHYLPREDNISOLONE SODIUM SUCC 40 MG IJ SOLR
40.0000 mg | Freq: Two times a day (BID) | INTRAMUSCULAR | Status: DC
  Administered 2021-01-30 – 2021-02-01 (×5): 40 mg via INTRAVENOUS
  Filled 2021-01-29 (×5): qty 1

## 2021-01-29 MED ORDER — HYDRALAZINE HCL 20 MG/ML IJ SOLN: 5.0000 mg | INTRAMUSCULAR | Status: DC | PRN

## 2021-01-29 MED ORDER — NITROGLYCERIN 2 % TD OINT
1.0000 [in_us] | TOPICAL_OINTMENT | Freq: Once | TRANSDERMAL | Status: AC
  Administered 2021-01-30: 1 [in_us] via TOPICAL
  Filled 2021-01-29: qty 1

## 2021-01-29 MED ORDER — PANTOPRAZOLE SODIUM 40 MG IV SOLR
40.0000 mg | Freq: Once | INTRAVENOUS | Status: AC
  Administered 2021-01-29: 40 mg via INTRAVENOUS
  Filled 2021-01-29: qty 40

## 2021-01-29 MED ORDER — IPRATROPIUM-ALBUTEROL 0.5-2.5 (3) MG/3ML IN SOLN: 3.0000 mL | Freq: Once | RESPIRATORY_TRACT | Status: DC

## 2021-01-29 MED ORDER — METHYLPREDNISOLONE SODIUM SUCC 125 MG IJ SOLR
125.0000 mg | Freq: Once | INTRAMUSCULAR | Status: AC
  Administered 2021-01-29: 125 mg via INTRAVENOUS
  Filled 2021-01-29: qty 2

## 2021-01-29 MED ORDER — HEPARIN BOLUS VIA INFUSION
4000.0000 [IU] | Freq: Once | INTRAVENOUS | Status: AC
  Administered 2021-01-30: 4000 [IU] via INTRAVENOUS
  Filled 2021-01-29: qty 4000

## 2021-01-29 MED ORDER — FUROSEMIDE 10 MG/ML IJ SOLN
20.0000 mg | Freq: Two times a day (BID) | INTRAMUSCULAR | Status: AC
  Administered 2021-01-30 (×2): 20 mg via INTRAVENOUS
  Filled 2021-01-29: qty 4

## 2021-01-29 MED ORDER — NICOTINE 14 MG/24HR TD PT24
14.0000 mg | MEDICATED_PATCH | Freq: Every day | TRANSDERMAL | Status: DC
  Administered 2021-01-30 – 2021-02-06 (×9): 14 mg via TRANSDERMAL
  Filled 2021-01-29 (×9): qty 1

## 2021-01-29 MED ORDER — IPRATROPIUM-ALBUTEROL 0.5-2.5 (3) MG/3ML IN SOLN
3.0000 mL | Freq: Once | RESPIRATORY_TRACT | Status: AC
  Administered 2021-01-29: 3 mL via RESPIRATORY_TRACT
  Filled 2021-01-29 (×2): qty 3

## 2021-01-29 MED ORDER — MORPHINE SULFATE (PF) 2 MG/ML IV SOLN: 2.0000 mg | INTRAVENOUS | Status: DC | PRN

## 2021-01-29 MED ORDER — SODIUM CHLORIDE 0.9% FLUSH
3.0000 mL | Freq: Two times a day (BID) | INTRAVENOUS | Status: DC
  Administered 2021-01-29 – 2021-02-06 (×14): 3 mL via INTRAVENOUS

## 2021-01-29 MED ORDER — ONDANSETRON HCL 4 MG PO TABS: 4.0000 mg | ORAL_TABLET | Freq: Four times a day (QID) | ORAL | Status: DC | PRN

## 2021-01-29 MED ORDER — HEPARIN (PORCINE) 25000 UT/250ML-% IV SOLN
1200.0000 [IU]/h | INTRAVENOUS | Status: DC
  Administered 2021-01-30: 1200 [IU]/h via INTRAVENOUS
  Filled 2021-01-29: qty 250

## 2021-01-29 NOTE — ED Triage Notes (Addendum)
Pt comes with c/o SOB for few days. Pt states no CP.Labored breathing and accessory muscle use.  Pt states black tarry stools for about week. Pt states he has hx of anemia. Pt states weakness that has increased and gotten worse.

## 2021-01-29 NOTE — H&P (Signed)
History and Physical    Gary A Phil Dopp. MLJ:[id number] DOB: 1950/07/08 DOA: 01/29/2021  PCP: Rusty Aus, MD    Patient coming from:  Home   Chief Complaint:  Shortness of breath   HPI:  Gary A Zackaria Burkey. is a 70 y.o. male seen in ed with complaints of SOB that has been progressively getting worse along with cough, patient also had a syncopal episode.  Patient states that the shortness of breath has been going on at least for the past week it is worse with exertion Gary Mccoy reports orthopnea but no chest tightness no palpitations Gary Mccoy does report coughing with the shortness of breath.  Gary Mccoy denies any NSAIDs Goody's or BC powders.  Patient denies any lower extremity edema.  Patient also denies any history of heart failure or ever seeing a heart doctor.  Patient went to his primary care and has also been having fatigue.  Patient then reports that Gary Mccoy has been having black stools over the past 6 weeks at least.  Gary Mccoy reports that Gary Mccoy had an episode of upset several days and weeks where Gary Mccoy just had loose stools.  These loose stools were melanotic.  Patient in the past has been told of a duodenal ulcer and has an upcoming colonoscopy and endoscopy in December for his black stools states that Gary Mccoy had to argue with the nurses to get him a school sooner schedule.  Gary Mccoy currently takes Xarelto for previous 2 strokes and his peripheral arterial disease where Gary Mccoy had lower extremity grafts.  Patient today is on BiPAP and is comfortable Gary Mccoy feels better denies any other complaints. Discussed with patient there is 2 parts to his presentation the shortness of breath which can be secondary to new onset CHF, viral infection, COPD tobacco abuse related, which we have to investigate second part is also the anemia that Gary Mccoy has had since 2017 which can be causing increased demand on his heart and causing him to go into failure or volume overload at the least.  Patient and wife at bedside understand and I have answered all  questions and concerns. Discussed with them that Gary Mccoy may need a GI evaluation sooner than December because of his melena and syncopal episodes.  Patient agrees of plan for admission and evaluation.   Pt has past medical history of arthritis, back pain, duodenal ulcers, GERD, PAD, strokes, CKD, hypertension, tobacco abuse.    ED Course: In the emergency room patient is alert awake oriented meets SIRS criteria is due to tachycardia and respiratory rate help which I suspect is noninfectious in his case. Vitals:   01/29/21 2330 01/30/21 0000 01/30/21 0030 01/30/21 0100  BP: (!) 170/82 (!) 176/88 (!) 166/84 (!) 174/90  Pulse: 93 95 89 86  Resp: _0 Temp:      SpO2: 97% 97% 95% 99%  Blood work shows rising troponin trend of 1 26-1 45, Elevated BNP of 1054.9. I have started patient on heparin NSTEMI protocol and held patient's Xarelto.  Discussed with patient the risk of bleeding and also quick discontinuation if Gary Mccoy bleeds I can discontinue heparin in the event if Gary Mccoy starts to bleed actively pt verbalized understanding.  CMP shows glucose of 104, creatinine of 2.23 and EGFR of 31, total bili of 1.7. CBC shows a normal white count of 8.9, hemoglobin of 10.9, platelet count of 218. In ed pt received lasix 20 mg iv and started on heparin drip. Also duoneb and solumedrol .  Review of Systems:  Review of Systems  Constitutional:  Positive for malaise/fatigue.  Gastrointestinal:  Positive for melena.  All other systems reviewed and are negative.   Past Medical History:  Diagnosis Date   Arthritis    lower back   Back pain    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    Complication of anesthesia    Violent with hallucinations after procedure 10/2015   DDD (degenerative disc disease), lumbar    Duodenal ulcer    Elevated lipids    GERD (gastroesophageal reflux disease)    Leg weakness, bilateral    s/p lumbar surgery   PAD (peripheral artery disease) ([place])    Polyradiculitis    Stroke ([place])     Rt lower leg  estimated 10 - 12 stokes in last 5 years   Wears dentures    full upper and lower    Past Surgical History:  Procedure Laterality Date   AORTA - BILATERAL FEMORAL ARTERY BYPASS GRAFT N/A 11/08/2015   Procedure: AORTA BIFEMORAL BYPASS GRAFT;  Surgeon: Katha Cabal, MD;  Location: [facility];  Service: Vascular;  Laterality: N/A;   BACK SURGERY  2005   [place]   COLONOSCOPY WITH PROPOFOL N/A 12/30/2017   Procedure: COLONOSCOPY WITH PROPOFOL;  Surgeon: Toledo, Benay Pike, MD;  Location: [facility];  Service: Gastroenterology;  Laterality: N/A;   ENDARTERECTOMY FEMORAL Bilateral 11/08/2015   Procedure: ENDARTERECTOMY FEMORAL;  Surgeon: Katha Cabal, MD;  Location: [facility];  Service: Vascular;  Laterality: Bilateral;  common, SFA, and profundis  Aortic endarterectomy    ESOPHAGOGASTRODUODENOSCOPY (EGD) WITH PROPOFOL N/A 09/04/2015   Procedure: ESOPHAGOGASTRODUODENOSCOPY (EGD) WITH PROPOFOL;  Surgeon: Lucilla Lame, MD;  Location: [place];  Service: Endoscopy;  Laterality: N/A;   LUMBAR LAMINECTOMY  2006   SEPTOPLASTY N/A 09/28/2020   Procedure: SEPTOPLASTY;  Surgeon: Margaretha Sheffield, MD;  Location: [place];  Service: ENT;  Laterality: N/A;   TEE WITHOUT CARDIOVERSION N/A 09/22/2015   Procedure: TRANSESOPHAGEAL ECHOCARDIOGRAM (TEE);  Surgeon: Teodoro Spray, MD;  Location: [facility];  Service: Cardiovascular;  Laterality: N/A;   TURBINATE REDUCTION Bilateral 09/28/2020   Procedure: TURBINATE REDUCTION;  Surgeon: Margaretha Sheffield, MD;  Location: [place];  Service: ENT;  Laterality: Bilateral;     reports that Gary Mccoy has been smoking cigarettes. Gary Mccoy has a 45.00 pack-year smoking history. Gary Mccoy has never used smokeless tobacco. Gary Mccoy reports current alcohol use. Gary Mccoy reports that Gary Mccoy does not use drugs.  No Known Allergies  Family History  Problem Relation Age of Onset   Heart attack Mother    Hypertension Mother    Varicose Veins  Mother    Diabetes Father    Heart attack Father    Heart attack Maternal Grandmother    Stroke Maternal Grandmother     Prior to Admission medications   Medication Sig Start Date End Date Taking? Authorizing Provider  amLODipine (NORVASC) 5 MG tablet Take 5 mg by mouth in the morning and at bedtime.   Yes [provider]  aspirin EC 81 MG tablet Take 1 oz by mouth daily.   Yes [provider]  Coenzyme Q10 (COQ10) 100 MG CAPS Take by mouth daily.   Yes [provider]  Multiple Vitamins-Minerals (MULTIVITAMIN ADULT PO) Take 1 tablet by mouth daily.    Yes [provider]  Omega-3 Fatty Acids (FISH OIL PO) Take by mouth.   Yes [provider]  pantoprazole (PROTONIX) 40 MG tablet Take 1 tablet by mouth daily. 01/11/21  01/11/22 Yes [provider]  rosuvastatin (CRESTOR) 40 MG tablet Take 20 mg by mouth daily.   Yes [provider]  vitamin B-12 (CYANOCOBALAMIN) 1000 MCG tablet Take 1,000 mcg by mouth daily.   Yes [provider]  XARELTO 20 MG TABS tablet Take 20 mg by mouth daily. 12/12/20  Yes [provider]  celecoxib (CELEBREX) 200 MG capsule Take 200 mg by mouth daily. Patient not taking: Reported on 01/29/2021 09/12/15   [provider]  cephALEXin (KEFLEX) 500 MG capsule Take 1 capsule (500 mg total) by mouth 2 (two) times daily. Patient not taking: No sig reported 09/28/20   Margaretha Sheffield, MD  nicotine (NICODERM CQ - DOSED IN MG/24 HOURS) 21 mg/24hr patch Place onto the skin. 11/22/15   [provider]  olmesartan-hydrochlorothiazide (BENICAR HCT) 20-12.5 MG tablet Take by mouth. 07/24/18 09/28/20  [provider]  predniSONE (DELTASONE) 10 MG tablet Start with 3 pills tomorrow. Taper over the next 6 days.  3,3,2,2,1,1. Patient not taking: No sig reported 09/28/20   Margaretha Sheffield, MD    Physical Exam: Vitals:   01/29/21 2330 01/30/21 0000 01/30/21 0030 01/30/21 0100  BP: (!)  170/82 (!) 176/88 (!) 166/84 (!) 174/90  Pulse: 93 95 89 86  Resp: _0 Temp:      SpO2: 97% 97% 95% 99%   Physical Exam Vitals and nursing note reviewed.  Constitutional:      General: Gary Mccoy is not in acute distress.    Appearance: Gary Mccoy is well-developed. Gary Mccoy is not ill-appearing, toxic-appearing or diaphoretic.  HENT:     Head: Normocephalic and atraumatic.     Right Ear: External ear normal.     Left Ear: External ear normal.     Mouth/Throat:     Mouth: Mucous membranes are moist.     Pharynx: Oropharynx is clear.  Eyes:     Extraocular Movements: Extraocular movements intact.     Pupils: Pupils are equal, round, and reactive to light.  Cardiovascular:     Rate and Rhythm: Normal rate and regular rhythm.     Pulses: Normal pulses.     Heart sounds: Normal heart sounds.  Pulmonary:     Effort: Pulmonary effort is normal.     Breath sounds: Normal breath sounds.  Abdominal:     General: Bowel sounds are normal. There is no distension.     Palpations: Abdomen is soft. There is no mass.     Tenderness: There is no abdominal tenderness. There is no guarding.     Hernia: No hernia is present.  Musculoskeletal:     Cervical back: Normal range of motion and neck supple.     Right lower leg: Edema present.     Left lower leg: Edema present.  Neurological:     General: No focal deficit present.     Mental Status: Gary Mccoy is alert and oriented to person, place, and time.  Psychiatric:        Mood and Affect: Mood normal.        Behavior: Behavior normal.     Labs on Admission: I have personally reviewed following labs and imaging studies  No results for input(s): CKTOTAL, CKMB, TROPONINI in the last 72 hours. Lab Results  Component Value Date   WBC 8.9 01/29/2021   HGB 10.9 (L) 01/29/2021   HCT 33.1 (L) 01/29/2021   MCV 88.3 01/29/2021   PLT 218 01/29/2021    Recent Labs  Lab 01/29/21 1700  NA 139  K 4.2  CL 108  CO2 21*  BUN 30*  CREATININE 2.23*  CALCIUM 9.1   PROT 7.9  BILITOT 1.7*  ALKPHOS 85  ALT 25  AST 29  GLUCOSE 104*   Lab Results  Component Value Date   CHOL 160 02/26/2018   HDL 35 (L) 02/26/2018   LDLCALC 102 (H) 02/26/2018   TRIG 117 02/26/2018   No results found for: DDIMER Invalid input(s): POCBNP   COVID-19 Labs No results for input(s): DDIMER, FERRITIN, LDH, CRP in the last 72 hours. Lab Results  Component Value Date   [place] NEGATIVE 01/29/2021    Radiological Exams on Admission: DG Chest 2 View  Result Date: 01/29/2021 CLINICAL DATA:  Shortness of breath, syncope EXAM: CHEST - 2 VIEW COMPARISON:  11/11/2015 FINDINGS: Trace bilateral pleural effusions. Bibasilar atelectasis, right greater than left. Heart is normal size. No acute bony abnormality. IMPRESSION: Bibasilar atelectasis and trace bilateral effusions. Electronically Signed   By: Rolm Baptise M.D.   On: 01/29/2021 17:27   US Venous Img Lower Bilateral  Result Date: 01/29/2021 CLINICAL DATA:  Bilateral leg swelling. Possible pulmonary embolus but unable to obtain chest CT angio due to renal function. Assess for DVT. EXAM: BILATERAL LOWER EXTREMITY VENOUS DOPPLER ULTRASOUND TECHNIQUE: Gray-scale sonography with compression, as well as color and duplex ultrasound, were performed to evaluate the deep venous system(s) from the level of the common femoral vein through the popliteal and proximal calf veins. COMPARISON:  None. FINDINGS: VENOUS Normal compressibility of the common femoral, superficial femoral, and popliteal veins, as well as the visualized calf veins. Visualized portions of profunda femoral vein and great saphenous vein unremarkable. No filling defects to suggest DVT on grayscale or color Doppler imaging. Doppler waveforms show normal direction of venous flow, normal respiratory plasticity and response to augmentation. OTHER None. Limitations: none IMPRESSION: No evidence of bilateral lower extremity DVT. Electronically Signed   By: Keith Rake  M.D.   On: 01/29/2021 22:52    EKG: Independently reviewed.  Sinus tach at 104 and no st t wave changes.   Echocardiogram 2019 : Left ventricle: Wall thickness was increased in a pattern of mild    LVH. Systolic function was mildly reduced. The estimated ejection    fraction was in the range of 45% to 50%. Doppler parameters are    consistent with abnormal left ventricular relaxation (grade 1    diastolic dysfunction).   Assessment/Plan: Patient is a 70 year old male coming to [place] with acute onset of shortness of breath.  We will admit him to stepdown unit. Principal Problem:   Shortness of breath Active Problems:   Acute respiratory failure with hypoxia (HCC)   Melena   Anemia   Duodenal ulcer disease   Peripheral vascular disease (HCC)   CVA (cerebral vascular accident) ([place])   Benign essential HTN   Hyperlipidemia   Tobacco abuse counseling Shortness of breath/acute respiratory failure with hypoxia Patient presenting to [place] with shortness of breath/acute respiratory failure with hypoxia: Blood pressure (!) 174/90, pulse 86, temperature 98 F (36.7 C), resp. rate 15, SpO2 99 %.  On BiPAP. Will admit patient to stepdown unit for respiratory failure. We will continue BiPAP and diuresis patient with close monitoring of electrolytes and kidney function. Clinically patient appears to be in congestive heart failure however does not carry a diagnosis of that. Supportive care with supplemental oxygen. Pulse oximetry check with vitals.  Melena/anemia/duodenal ulcer history/ GIB: Patient has an upcoming colonoscopy and endoscopy scheduled  with GI. I suspect patient will need this GI consult and is the etiology for his syncope episode recently. IV PPI, anemia panel, type and screen, stool guaiac.  H&H every 6 hours x 3 GI consult per a.m. team.  Changed patient's Xarelto to heparin drip.  Peripheral vascular disease, history of CVA: Patient has a history of bilateral femoral artery  bypass graft. Patient also is on Xarelto for prevention of VTE's. Due to patient's anemia we will hold patient's Xarelto and start patient on heparin drip explained the patient the plan.  Gary Mccoy verbalized understanding. In the event that the patient starts to have an active bleed.  Heparin started protamine transfuse as needed.  Hypertension: Blood pressure (!) 174/90, pulse 86, temperature 98 F (36.7 C), resp. rate 15, SpO2 99 %. Patient is on any blood pressure medications Gary Mccoy is on diuretic therapy.  Patient is currently on Benicar HCTZ.  We will hold Benicar HCTZ secondary to his acute kidney function worsening from his previous creatinine of 1.69.  As needed hydralazine for blood pressure.  Hyperlipidemia: Patient continued on statin therapy.   Tobacco abuse/counseling: Discussed with patient about tobacco cessation and risks of ongoing tobacco.    DVT prophylaxis:  Heparin drip  Code Status:  Full code  Family Communication:  Conyer,Cheryl H (Spouse)  [phone] (Mobile)   Disposition Plan:  Home  Consults called:  None   Admission status: Inpatient   Para Skeans MD [facility] [phone] How to contact the [facility] Attending or Consulting provider [place] or covering provider during after hours [place], for this patient.    Check the care team in [facility] and look for a) attending/consulting [facility] provider listed and b) the [facility] team listed Log into [email or web address] and use [place]'s universal password to access. If you do not have the password, please contact the hospital operator. Locate the [facility] provider you are looking for under [facility] and page to a number that you can be directly reached. If you still have difficulty reaching the provider, please page the [facility] (Director on Call) for the Hospitalists listed on amion for assistance. [email or web address] Password [email or web address] 01/30/2021, 1:59 AM

## 2021-01-29 NOTE — ED Provider Notes (Signed)
Emergency Medicine Provider Triage Evaluation Note  Gary Mccoy. , a 70 y.o. male  was evaluated in triage.  Pt complains of progressive shortness of breath, cough, syncope.  Patient has with what appear to be bronchitis, and then was treated for pneumonia and has had worsening cough and shortness of breath over the past 3 weeks.  Patient had had a coughing spell today, and then had a syncopal episode lasting 20 to 40 seconds.  Patient denies any chest pain but states that he does have some pain in his "lungs" when taking a deep breath.  Patient does have a history of occlusive strokes, is on anticoagulation.  Patient states that he is also undergoing a work-up currently for anemia secondary to what appears to be GI blood loss.  Patient had a positive Hemoccult, and is scheduled for CTs of his abdomen and pelvis to further evaluate his anemia..  Review of Systems  Positive: Shortness of breath, cough, syncope, anemia Negative: Headache, visual changes, fevers or chills, nasal congestion, substernal pain, abdominal pain, nausea vomiting, diarrhea or constipation.  Urinary changes.  Physical Exam  BP (!) 186/79   Pulse (!) 111   Temp 98 F (36.7 C)   Resp (!) 22   SpO2 95%  Gen:   Awake, no distress   Resp:  Normal effort  MSK:   Moves extremities without difficulty  Other:  No gross adventitious lung sounds  Medical Decision Making  Medically screening exam initiated at 4:57 PM.  Appropriate orders placed.  Gary Mccoy. was informed that the remainder of the evaluation will be completed by another provider, this initial triage assessment does not replace that evaluation, and the importance of remaining in the ED until their evaluation is complete.  Patient presents with ongoing cough, shortness of breath times several weeks.  Has been treated for both bronchitis and pneumonia and is worsening with his cough and shortness of breath.  Patient had a syncopal episode today.  He  does have a history of occlusive strokes, is on anticoagulation.  He does have what appears to be somewhat pleuritic pain.  Patient is also undergoing work-up currently for what appears to be blood loss anemia through his GI tract.  At this time patient will have labs, EKG, chest x-ray.  I have not ordered anemia panel as patient just had anemia panel not resulted 5 days ago.  Suspect patient will likely need CT scans as well.   Darletta Moll, PA-C 01/29/21 1704    Vladimir Crofts, MD 01/29/21 [phone]

## 2021-01-29 NOTE — Progress Notes (Signed)
[facility] for heparin infusion monitoring and titration Indication: chest pain/ACS  No Known Allergies  Patient Measurements: 89.8 kg (from 01/24/21 office visit)    Vital Signs: Temp: 98 F (36.7 C) (11/14 1656) BP: 191/95 (11/14 1900) Pulse Rate: 114 (11/14 1900)  Labs: Recent Labs    01/29/21 1700  HGB 10.9*  HCT 33.1*  PLT 218  APTT 52*  LABPROT 21.5*  INR 1.9*  CREATININE 2.23*  TROPONINIHS 126*    CrCl cannot be calculated (Unknown ideal weight.).   Medical History: Past Medical History:  Diagnosis Date   Arthritis    lower back   Back pain    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    Complication of anesthesia    Violent with hallucinations after procedure 10/2015   DDD (degenerative disc disease), lumbar    Duodenal ulcer    Elevated lipids    GERD (gastroesophageal reflux disease)    Leg weakness, bilateral    s/p lumbar surgery   PAD (peripheral artery disease) (HCC)    Polyradiculitis    Stroke ([place])    Rt lower leg  estimated 10 - 12 stokes in last 5 years   Wears dentures    full upper and lower    Medications:  Scheduled:   furosemide  20 mg Intravenous Once   heparin  4,000 Units Intravenous Once   nitroGLYCERIN  1 inch Topical Once    Assessment: 70 y.o. male with history of peripheral artery disease, hypertension, embolic CVA, hyperlipidemia, here with progressively worsening shortness of breath, cough, and syncope. He takes rivaroxaban at home with last dose 01/28/21. Additionally MD notes  pleuritic pain which could suggest possible PE with scans pending.  Baseline aPTT 52 seconds at 1700 01/29/21  Goal of Therapy:  Heparin level 0.3-0.7 units/ml aPTT 66 - 102 seconds Monitor platelets by anticoagulation protocol: Yes   Plan:  Give 4000 units bolus x 1 Start heparin infusion at 1200 units/hr Check anti-Xa and aPTT level in 8 hours and daily while on heparin Continue to monitor H&H and  platelets  Dallie Piles 01/29/2021,7:22 PM

## 2021-01-29 NOTE — ED Notes (Signed)
Report received from Lindsay, RN

## 2021-01-29 NOTE — ED Notes (Signed)
Hospitalist at the bedside 

## 2021-01-29 NOTE — ED Provider Notes (Signed)
[facility] Emergency Department Provider Note  ____________________________________________   Event Date/Time   First MD Initiated Contact with Patient 01/29/21 1732     (approximate)  I have reviewed the triage vital signs and the nursing notes.   HISTORY  Chief Complaint Shortness of Breath    HPI Gary Mccoy. is a 70 y.o. male with history of peripheral artery disease, hypertension, hyperlipidemia, here with progressively worsening shortness of breath, cough, and syncope.  The patient states that over the last several weeks, he has had progressively worsening shortness of breath and cough.  He states that he has had multiple things happening recently.  Over the last several months, he has reportedly had some dark, black, tarry stools and has had worsening fatigue.  He is currently being set up for a colonoscopy and EGD for this.  He states that more recently, over the last week or so, he has had worsening shortness of breath as well as cough.  He has had clear sputum production.  He has had significant dyspnea, particularly with any kind of movement.  He has had associated significant fatigue.  He has had to sit increasingly upright due to this.  No history of heart failure.  He has had some mild swelling in his bilateral legs.  This is all new for him.  He has also had some somewhat pleuritic chest pain, worse when he takes a deep breath in.  Denies history of PE.  No specific alleviating factors other than rest and sitting upright.    Past Medical History:  Diagnosis Date   Arthritis    lower back   Back pain    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    Complication of anesthesia    Violent with hallucinations after procedure 10/2015   DDD (degenerative disc disease), lumbar    Duodenal ulcer    Elevated lipids    GERD (gastroesophageal reflux disease)    Leg weakness, bilateral    s/p lumbar surgery   PAD (peripheral artery disease) (HCC)     Polyradiculitis    Stroke ([place])    Rt lower leg  estimated 10 - 12 stokes in last 5 years   Wears dentures    full upper and lower    Patient Active Problem List   Diagnosis Date Noted   Stage 3b chronic kidney disease ([place]) 12/31/2018   CVA (cerebral vascular accident) ([place]) 02/26/2018   Tubular adenoma 10/29/2016   Bilateral carotid artery stenosis 04/10/2016   Hyperlipidemia 01/01/2016   B12 deficiency 12/29/2015   Weakness of right lower extremity 09/19/2015   Tobacco abuse counseling 09/19/2015   Peripheral vascular disease ([place]) 09/19/2015   Leukocytosis 09/19/2015   CVA (cerebral infarction) 09/19/2015   Paralysis ([place])    Abnormal findings-gastrointestinal tract    Gastritis    Duodenal ulcer disease    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 08/29/2015   Contact with and (suspected ) exposure to other hazardous substances 01/27/2014   Exposure to hazardous material 01/27/2014   Degeneration of intervertebral disc of lumbar region 11/12/2013   Degeneration of intervertebral disc of lumbosacral region 11/12/2013   Neuritis or radiculitis due to rupture of lumbar intervertebral disc 11/12/2013   Thoracic neuritis 11/12/2013   Benign essential HTN 02/01/2013    Past Surgical History:  Procedure Laterality Date   AORTA - BILATERAL FEMORAL ARTERY BYPASS GRAFT N/A 11/08/2015   Procedure: AORTA BIFEMORAL BYPASS GRAFT;  Surgeon: Katha Cabal, MD;  Location: [facility] ORS;  Service: Vascular;  Laterality: N/A;   BACK SURGERY  2005   CERVICAL DISC SURGERY  1987   COLONOSCOPY WITH PROPOFOL N/A 12/30/2017   Procedure: COLONOSCOPY WITH PROPOFOL;  Surgeon: Toledo, Benay Pike, MD;  Location: [facility] ENDOSCOPY;  Service: Gastroenterology;  Laterality: N/A;   ENDARTERECTOMY FEMORAL Bilateral 11/08/2015   Procedure: ENDARTERECTOMY FEMORAL;  Surgeon: Katha Cabal, MD;  Location: [facility] OR[facility];  Service: Vascular;  Laterality: Bilateral;  common, SFA, and profundis  Aortic endarterectomy     ESOPHAGOGASTRODUODENOSCOPY (EGD) WITH PROPOFOL N/A 09/04/2015   Procedure: ESOPHAGOGASTRODUODENOSCOPY (EGD) WITH PROPOFOL;  Surgeon: Lucilla Lame, MD;  Location: [facility] [place];  Service: Endoscopy;  Laterality: N/A;   LUMBAR LAMINECTOMY  2006   SEPTOPLASTY N/A 09/28/2020   Procedure: SEPTOPLASTY;  Surgeon: Margaretha Sheffield, MD;  Location: [facility];  Service: ENT;  Laterality: N/A;   TEE WITHOUT CARDIOVERSION N/A 09/22/2015   Procedure: TRANSESOPHAGEAL ECHOCARDIOGRAM (TEE);  Surgeon: Teodoro Spray, MD;  Location: [facility] OR[facility];  Service: Cardiovascular;  Laterality: N/A;   TURBINATE REDUCTION Bilateral 09/28/2020   Procedure: TURBINATE REDUCTION;  Surgeon: Margaretha Sheffield, MD;  Location: [facility];  Service: ENT;  Laterality: Bilateral;    Prior to Admission medications   Medication Sig Start Date End Date Taking? Authorizing Provider  amLODipine (NORVASC) 5 MG tablet Take 5 mg by mouth in the morning and at bedtime.   Yes [provider]  aspirin EC 81 MG tablet Take 1 oz by mouth daily.   Yes [provider]  Coenzyme Q10 (COQ10) 100 MG CAPS Take by mouth daily.   Yes [provider]  Multiple Vitamins-Minerals (MULTIVITAMIN ADULT PO) Take 1 tablet by mouth daily.    Yes [provider]  Omega-3 Fatty Acids (FISH OIL PO) Take by mouth.   Yes [provider]  pantoprazole (PROTONIX) 40 MG tablet Take 1 tablet by mouth daily. 01/11/21 01/11/22 Yes [provider]  rosuvastatin (CRESTOR) 40 MG tablet Take 20 mg by mouth daily.   Yes [provider]  vitamin B-12 (CYANOCOBALAMIN) 1000 MCG tablet Take 1,000 mcg by mouth daily.   Yes [provider]  XARELTO 20 MG TABS tablet Take 20 mg by mouth daily. 12/12/20  Yes [provider]  celecoxib (CELEBREX) 200 MG capsule Take 200 mg by mouth daily. Patient not taking: Reported on 01/29/2021 09/12/15   [provider]  cephALEXin (KEFLEX) 500 MG capsule  Take 1 capsule (500 mg total) by mouth 2 (two) times daily. Patient not taking: No sig reported 09/28/20   Margaretha Sheffield, MD  nicotine (NICODERM CQ - DOSED IN MG/24 HOURS) 21 mg/24hr patch Place onto the skin. 11/22/15   [provider]  olmesartan-hydrochlorothiazide (BENICAR HCT) 20-12.5 MG tablet Take by mouth. 07/24/18 09/28/20  [provider]  predniSONE (DELTASONE) 10 MG tablet Start with 3 pills tomorrow. Taper over the next 6 days.  3,3,2,2,1,1. Patient not taking: No sig reported 09/28/20   Margaretha Sheffield, MD    Allergies Patient has no known allergies.  Family History  Problem Relation Age of Onset   Heart attack Mother    Hypertension Mother    Varicose Veins Mother    Diabetes Father    Heart attack Father    Heart attack Maternal Grandmother    Stroke Maternal Grandmother     Social History Social History   Tobacco Use   Smoking status: Every Day    Packs/day: 1.00    Years: 45.00    Pack years: 45.00  Types: Cigarettes   Smokeless tobacco: Never   Tobacco comments:    09/21/20- currently 0.5 PPD  Vaping Use   Vaping Use: Never used  Substance Use Topics   Alcohol use: Yes    Comment: rare 3 a year   Drug use: No    Review of Systems  Review of Systems  Constitutional:  Positive for fatigue. Negative for chills and fever.  HENT:  Negative for sore throat.   Respiratory:  Positive for shortness of breath.   Cardiovascular:  Positive for chest pain and palpitations.  Gastrointestinal:  Negative for abdominal pain.  Genitourinary:  Negative for flank pain.  Musculoskeletal:  Negative for neck pain.  Skin:  Negative for rash and wound.  Allergic/Immunologic: Negative for immunocompromised state.  Neurological:  Negative for weakness and numbness.  Hematological:  Does not bruise/bleed easily.    ____________________________________________  PHYSICAL EXAM:      VITAL SIGNS: ED Triage Vitals  Enc Vitals Group     BP 01/29/21 1656 (!)  186/79     Pulse Rate 01/29/21 1656 (!) 111     Resp 01/29/21 1656 (!) 28     Temp 01/29/21 1656 98 F (36.7 C)     Temp src --      SpO2 01/29/21 1656 95 %     Weight --      Height --      Head Circumference --      Peak Flow --      Pain Score 01/29/21 1658 6     Pain Loc --      Pain Edu? --      Excl. in [place]? --      Physical Exam Vitals and nursing note reviewed.  Constitutional:      General: He is not in acute distress.    Appearance: He is well-developed.  HENT:     Head: Normocephalic and atraumatic.  Eyes:     Conjunctiva/sclera: Conjunctivae normal.  Cardiovascular:     Rate and Rhythm: Regular rhythm. Tachycardia present.     Heart sounds: Normal heart sounds. No murmur heard.   No friction rub.  Pulmonary:     Effort: Tachypnea and respiratory distress present.     Breath sounds: Examination of the right-lower field reveals rales. Examination of the left-lower field reveals rales. Decreased breath sounds, wheezing and rales present.  Abdominal:     General: There is no distension.     Palpations: Abdomen is soft.     Tenderness: There is no abdominal tenderness.  Musculoskeletal:     Cervical back: Neck supple.     Right lower leg: Edema present.     Left lower leg: Edema present.  Skin:    General: Skin is warm.     Capillary Refill: Capillary refill takes less than 2 seconds.  Neurological:     Mental Status: He is alert and oriented to person, place, and time.     Motor: No abnormal muscle tone.      ____________________________________________   LABS (all labs ordered are listed, but only abnormal results are displayed)  Labs Reviewed  COMPREHENSIVE METABOLIC PANEL - Abnormal; Notable for the following components:      Result Value   CO2 21 (*)    Glucose, Bld 104 (*)    BUN 30 (*)    Creatinine, Ser 2.23 (*)    Total Bilirubin 1.7 (*)    GFR, Estimated 31 (*)    All other components  within normal limits  CBC WITH  DIFFERENTIAL/PLATELET - Abnormal; Notable for the following components:   RBC 3.75 (*)    Hemoglobin 10.9 (*)    HCT 33.1 (*)    Lymphs Abs 0.6 (*)    All other components within normal limits  PROTIME-INR - Abnormal; Notable for the following components:   Prothrombin Time 21.5 (*)    INR 1.9 (*)    All other components within normal limits  APTT - Abnormal; Notable for the following components:   aPTT 52 (*)    All other components within normal limits  BRAIN NATRIURETIC PEPTIDE - Abnormal; Notable for the following components:   B Natriuretic Peptide 1,054.9 (*)    All other components within normal limits  TROPONIN I (HIGH SENSITIVITY) - Abnormal; Notable for the following components:   Troponin I (High Sensitivity) 126 (*)    All other components within normal limits  MAGNESIUM  URINALYSIS, ROUTINE W REFLEX MICROSCOPIC  TYPE AND SCREEN  TROPONIN I (HIGH SENSITIVITY)    ____________________________________________  EKG: Sinus tachycardia, ventricular rate 104.  PR 164, QRS 70, QTc 447.  No acute ST elevations or depressions.  No acute evidence of acute ischemia or infarct. ________________________________________  RADIOLOGY All imaging, including plain films, CT scans, and ultrasounds, independently reviewed by me, and interpretations confirmed via formal radiology reads.  ED MD interpretation:   Chest x-ray: Bibasilar atelectasis and trace bilateral effusions  Official radiology report(s): DG Chest 2 View  Result Date: 01/29/2021 CLINICAL DATA:  Shortness of breath, syncope EXAM: CHEST - 2 VIEW COMPARISON:  11/11/2015 FINDINGS: Trace bilateral pleural effusions. Bibasilar atelectasis, right greater than left. Heart is normal size. No acute bony abnormality. IMPRESSION: Bibasilar atelectasis and trace bilateral effusions. Electronically Signed   By: Rolm Baptise M.D.   On: 01/29/2021 17:27    ____________________________________________  PROCEDURES   Procedure(s)  performed (including Critical Care):  .1-3 Lead EKG Interpretation Performed by: Duffy Bruce, MD Authorized by: Duffy Bruce, MD     Interpretation: non-specific     ECG rate:  100-120   ECG rate assessment: tachycardic     Rhythm: sinus tachycardia     Ectopy: none     Conduction: normal   Comments:     Indication: SOB .Critical Care Performed by: Duffy Bruce, MD Authorized by: Duffy Bruce, MD   Critical care provider statement:    Critical care time (minutes):  30   Critical care time was exclusive of:  Separately billable procedures and treating other patients   Critical care was necessary to treat or prevent imminent or life-threatening deterioration of the following conditions:  Cardiac failure, circulatory failure and respiratory failure   Critical care was time spent personally by me on the following activities:  Development of treatment plan with patient or surrogate, discussions with consultants, evaluation of patient's response to treatment, examination of patient, ordering and review of laboratory studies, ordering and review of radiographic studies, ordering and performing treatments and interventions, pulse oximetry, re-evaluation of patient's condition and review of old charts   I assumed direction of critical care for this patient from another provider in my specialty: no     Care discussed with: admitting provider    ____________________________________________  INITIAL IMPRESSION / MDM / [place] / ED COURSE  As part of my medical decision making, I reviewed the following data within the [place] notes reviewed and incorporated, Old chart reviewed, Notes from prior ED visits, and [place] Controlled  Substance Database       *Posey A Malcolm Quast. was evaluated in Emergency Department on 01/29/2021 for the symptoms described in the history of present illness. He was evaluated in the context of the global COVID-19  pandemic, which necessitated consideration that the patient might be at risk for infection with the SARS-CoV-2 virus that causes COVID-19. Institutional protocols and algorithms that pertain to the evaluation of patients at risk for COVID-19 are in a state of rapid change based on information released by regulatory bodies including the CDC and federal and state organizations. These policies and algorithms were followed during the patient's care in the ED.  Some ED evaluations and interventions may be delayed as a result of limited staffing during the pandemic.*     Medical Decision Making: 70 year old male with history of peripheral artery disease, hypertension, hyperlipidemia, here with shortness of breath.  On arrival, patient in obvious respiratory distress with tachypnea and bilateral rails and near tripoding.  Patient placed on BiPAP with significant improvement.  Patient is tachycardic, hypertensive, with some edema concerning for possible CHF.  He does have history of recent possible GI bleed, raising question of high-output heart failure although he also has fairly significant history of hypertension, peripheral vascular disease, and may have underlying heart failure from ischemic heart disease.  No chest pain currently to suggest ischemia, though he does also have a mild pleuritic pain which could suggest possible PE.  Lab work shows elevated troponin and BNP consistent with CHF, possibly NSTEMI.  Chest x-ray reviewed shows bibasilar atelectasis and trace bilateral effusions.  EKG shows sinus tach without ischemia.  CMP with mild possible acute on chronic kidney injury.  CBC with hemoglobin of 10.9.  Will place patient on BiPAP, give a low-dose of Lasix, as well as nitroglycerin for his hypertension. He is already on Xarelto for his bypass. This makes PE less likely but in setting of his new CHF, elevated trop, BNP, will transition to heparin with pharmacy consult.    ____________________________________________  FINAL CLINICAL IMPRESSION(S) / ED DIAGNOSES  Final diagnoses:  Acute on chronic congestive heart failure, unspecified heart failure type ([place])  Respiratory distress  Elevated troponin     MEDICATIONS GIVEN DURING THIS VISIT:  Medications  nitroGLYCERIN (NITROGLYN) 2 % ointment 1 inch (has no administration in time range)  furosemide (LASIX) injection 20 mg (has no administration in time range)  ipratropium-albuterol (DUONEB) 0.5-2.5 (3) MG/3ML nebulizer solution 3 mL (3 mLs Nebulization Given 01/29/21 1840)  ipratropium-albuterol (DUONEB) 0.5-2.5 (3) MG/3ML nebulizer solution 3 mL (3 mLs Nebulization Given 01/29/21 1840)  methylPREDNISolone sodium succinate (SOLU-MEDROL) 125 mg/2 mL injection 125 mg (125 mg Intravenous Given 01/29/21 1825)  pantoprazole (PROTONIX) injection 40 mg (40 mg Intravenous Given 01/29/21 1819)  ipratropium-albuterol (DUONEB) 0.5-2.5 (3) MG/3ML nebulizer solution 3 mL (3 mLs Nebulization Given 01/29/21 1818)     ED Discharge Orders     None        Note:  This document was prepared using Dragon voice recognition software and may include unintentional dictation errors.   Duffy Bruce, MD 01/29/21 [phone]

## 2021-01-30 ENCOUNTER — Inpatient Hospital Stay
Admit: 2021-01-30 | Discharge: 2021-01-30 | Disposition: A | Discharge status: Home / Self Care | Payer: Medicare HMO | Attending: Internal Medicine | Admitting: Internal Medicine

## 2021-01-30 DIAGNOSIS — D649 Anemia, unspecified: Secondary | ICD-10-CM | POA: Diagnosis present

## 2021-01-30 DIAGNOSIS — R0602 Shortness of breath: Secondary | ICD-10-CM | POA: Diagnosis not present

## 2021-01-30 DIAGNOSIS — K921 Melena: Secondary | ICD-10-CM | POA: Diagnosis present

## 2021-01-30 LAB — BASIC METABOLIC PANEL
Anion gap: 9 (ref 5–15)
BUN: 34 mg/dL — ABNORMAL HIGH (ref 8–23)
CO2: 20 mmol/L — ABNORMAL LOW (ref 22–32)
Calcium: 8.8 mg/dL — ABNORMAL LOW (ref 8.9–10.3)
Chloride: 109 mmol/L (ref 98–111)
Creatinine, Ser: 2.36 mg/dL — ABNORMAL HIGH (ref 0.61–1.24)
GFR, Estimated: 29 mL/min — ABNORMAL LOW (ref >60)
Glucose, Bld: 166 mg/dL — ABNORMAL HIGH (ref 70–99)
Potassium: 4.6 mmol/L (ref 3.5–5.1)
Sodium: 138 mmol/L (ref 135–145)

## 2021-01-30 LAB — CBC
HCT: 30.3 % — ABNORMAL LOW (ref 39.0–52.0)
Hemoglobin: 9.9 g/dL — ABNORMAL LOW (ref 13.0–17.0)
MCH: 29.2 pg (ref 26.0–34.0)
MCHC: 32.7 g/dL (ref 30.0–36.0)
MCV: 89.4 fL (ref 80.0–100.0)
Platelets: 188 10*3/uL (ref 150–400)
RBC: 3.39 MIL/uL — ABNORMAL LOW (ref 4.22–5.81)
RDW: 13.7 % (ref 11.5–15.5)
WBC: 4.1 10*3/uL (ref 4.0–10.5)
nRBC: 0 % (ref 0.0–0.2)

## 2021-01-30 LAB — ECHOCARDIOGRAM COMPLETE
AR max vel: 3.14 cm2
AV Area VTI: 2.52 cm2
AV Area mean vel: 2.66 cm2
AV Mean grad: 3 mmHg
AV Peak grad: 4 mmHg
Ao pk vel: 1 m/s
Area-P 1/2: 6.83 cm2
Calc EF: 23.9 %
MV VTI: 2.93 cm2
S' Lateral: 4.3 cm
Single Plane A2C EF: 28 %
Single Plane A4C EF: 23.9 %

## 2021-01-30 LAB — HEMOGLOBIN AND HEMATOCRIT, BLOOD
HCT: 29.8 % — ABNORMAL LOW (ref 39.0–52.0)
HCT: 28.6 % — ABNORMAL LOW (ref 39.0–52.0)
Hemoglobin: 10 g/dL — ABNORMAL LOW (ref 13.0–17.0)
Hemoglobin: 9.6 g/dL — ABNORMAL LOW (ref 13.0–17.0)

## 2021-01-30 LAB — APTT
aPTT: >160 seconds (ref 24–36)
aPTT: 92 seconds — ABNORMAL HIGH (ref 24–36)
aPTT: 94 seconds — ABNORMAL HIGH (ref 24–36)

## 2021-01-30 LAB — HIV ANTIBODY (ROUTINE TESTING W REFLEX): HIV Screen 4th Generation wRfx: NONREACTIVE

## 2021-01-30 LAB — IRON AND TIBC
Iron: 18 ug/dL — ABNORMAL LOW (ref 45–182)
Saturation Ratios: 6 % — ABNORMAL LOW (ref 17.9–39.5)
TIBC: 294 ug/dL (ref 250–450)
UIBC: 276 ug/dL

## 2021-01-30 LAB — RESP PANEL BY RT-PCR (FLU A&B, COVID) ARPGX2
Influenza A by PCR: NEGATIVE
Influenza B by PCR: NEGATIVE
SARS Coronavirus 2 by RT PCR: NEGATIVE

## 2021-01-30 LAB — URINALYSIS, ROUTINE W REFLEX MICROSCOPIC
Bilirubin Urine: NEGATIVE
Glucose, UA: NEGATIVE mg/dL
Ketones, ur: NEGATIVE mg/dL
Leukocytes,Ua: NEGATIVE
Nitrite: NEGATIVE
Protein, ur: NEGATIVE mg/dL
Specific Gravity, Urine: 1.005 (ref 1.005–1.030)
pH: 5 (ref 5.0–8.0)

## 2021-01-30 LAB — RETICULOCYTES
Immature Retic Fract: 21.7 % — ABNORMAL HIGH (ref 2.3–15.9)
RBC.: 3.37 MIL/uL — ABNORMAL LOW (ref 4.22–5.81)
Retic Count, Absolute: 64.4 10*3/uL (ref 19.0–186.0)
Retic Ct Pct: 1.9 % (ref 0.4–3.1)

## 2021-01-30 LAB — HEMOGLOBIN A1C
Hgb A1c MFr Bld: 5.2 % (ref 4.8–5.6)
Mean Plasma Glucose: 102.54 mg/dL

## 2021-01-30 LAB — HEPARIN LEVEL (UNFRACTIONATED): Heparin Unfractionated: >1.1 IU/mL — ABNORMAL HIGH (ref 0.30–0.70)

## 2021-01-30 LAB — TROPONIN I (HIGH SENSITIVITY): Troponin I (High Sensitivity): 126 ng/L (ref <18)

## 2021-01-30 LAB — FERRITIN: Ferritin: 193 ng/mL (ref 24–336)

## 2021-01-30 LAB — VITAMIN B12: Vitamin B-12: 531 pg/mL (ref 180–914)

## 2021-01-30 LAB — FOLATE: Folate: 16.9 ng/mL (ref >5.9)

## 2021-01-30 MED ORDER — PANTOPRAZOLE SODIUM 40 MG IV SOLR
40.0000 mg | INTRAVENOUS | Status: DC
  Administered 2021-01-30 – 2021-02-04 (×6): 40 mg via INTRAVENOUS
  Filled 2021-01-30 (×5): qty 40

## 2021-01-30 MED ORDER — ROSUVASTATIN CALCIUM 10 MG PO TABS
20.0000 mg | ORAL_TABLET | Freq: Every day | ORAL | Status: DC
  Administered 2021-01-30 – 2021-02-06 (×7): 20 mg via ORAL
  Filled 2021-01-30: qty 1
  Filled 2021-01-30: qty 2
  Filled 2021-01-30: qty 1
  Filled 2021-01-30: qty 2
  Filled 2021-01-30: qty 1
  Filled 2021-01-30 (×3): qty 2

## 2021-01-30 MED ORDER — METOPROLOL TARTRATE 25 MG PO TABS
12.5000 mg | ORAL_TABLET | Freq: Two times a day (BID) | ORAL | Status: AC
  Administered 2021-01-30 – 2021-02-04 (×12): 12.5 mg via ORAL
  Filled 2021-01-30 (×12): qty 1

## 2021-01-30 MED ORDER — ASPIRIN EC 81 MG PO TBEC
81.0000 mg | DELAYED_RELEASE_TABLET | Freq: Every day | ORAL | Status: DC
  Administered 2021-01-30 – 2021-02-06 (×8): 81 mg via ORAL
  Filled 2021-01-30 (×8): qty 1

## 2021-01-30 MED ORDER — HEPARIN (PORCINE) 25000 UT/250ML-% IV SOLN
1400.0000 [IU]/h | INTRAVENOUS | Status: AC
  Administered 2021-01-30 (×2): 1000 [IU]/h via INTRAVENOUS
  Administered 2021-02-01: 21:00:00 1400 [IU]/h via INTRAVENOUS
  Filled 2021-01-30 (×3): qty 250

## 2021-01-30 MED ORDER — FUROSEMIDE 10 MG/ML IJ SOLN
20.0000 mg | Freq: Two times a day (BID) | INTRAMUSCULAR | Status: AC
  Administered 2021-01-31: 20 mg via INTRAVENOUS
  Filled 2021-01-30: qty 4

## 2021-01-30 NOTE — Progress Notes (Signed)
Brief cardiology consult note Imp Sob Chf PVD Smoking Anemia  Cri Hyperlipidemia GI bleeding Acute Respiratory Failure  COPD Hx CVA . Plan Agree with admin  Resp support IV lasix Inhalers/steroids Echo for LVF ACE/ARB/ spironolactone/B-blocker Nephrology input Advise to quit smoking  Statin therapy Agree with holding Xarelto  May need ischemic w/u inpatient/outpatient   Full note to follow

## 2021-01-30 NOTE — Consult Note (Signed)
Consultation  Referring Provider:   Hospitalist   Admit date: 11/14 Consult date: 11/15         Reason for Consultation: Dark stool             HPI:   Gary A [place]oy [place]. is a 70 y.o. gentleman with history of PVD on NOAC here for heart failure exacerbation. EF around 25% which is down from 45% a few years ago. He also has history of PUD on EGD several years ago and polyp on colonoscopy done in 2019. We were consulted for IDA in the setting of report of dark stools. He was seen as an outpatient with GI with plans for EGD/Colonoscopy early next month. He notes that he has had dark stools for several months and was going 8-10 times a day but was mostly gas. Had a chest cold a few weeks ago and since then he has progressive dyspnea and orthopnea and found to have a heart failure exacerbation on presentation which is something that appears new for him. His last bowel movement was yesterday. He is currently on heparin drip for possible NSTEMI. He feels better after some diuresis.  Past Medical History:  Diagnosis Date   Arthritis    lower back   Back pain    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    Complication of anesthesia    Violent with hallucinations after procedure 10/2015   DDD (degenerative disc disease), lumbar    Duodenal ulcer    Elevated lipids    GERD (gastroesophageal reflux disease)    Leg weakness, bilateral    s/p lumbar surgery   PAD (peripheral artery disease) ([place])    Polyradiculitis    Stroke ([place])    Rt lower leg  estimated 10 - 12 stokes in last 5 years   Wears dentures    full upper and lower    Past Surgical History:  Procedure Laterality Date   AORTA - BILATERAL FEMORAL ARTERY BYPASS GRAFT N/A 11/08/2015   Procedure: AORTA BIFEMORAL BYPASS GRAFT;  Surgeon: Katha Cabal, MD;  Location: [facility];  Service: Vascular;  Laterality: N/A;   BACK SURGERY  2005   [place]   COLONOSCOPY WITH PROPOFOL N/A 12/30/2017   Procedure: COLONOSCOPY WITH PROPOFOL;   Surgeon: Toledo, Benay Pike, MD;  Location: [facility];  Service: Gastroenterology;  Laterality: N/A;   ENDARTERECTOMY FEMORAL Bilateral 11/08/2015   Procedure: ENDARTERECTOMY FEMORAL;  Surgeon: Katha Cabal, MD;  Location: [facility];  Service: Vascular;  Laterality: Bilateral;  common, SFA, and profundis  Aortic endarterectomy    ESOPHAGOGASTRODUODENOSCOPY (EGD) WITH PROPOFOL N/A 09/04/2015   Procedure: ESOPHAGOGASTRODUODENOSCOPY (EGD) WITH PROPOFOL;  Surgeon: Lucilla Lame, MD;  Location: [place];  Service: Endoscopy;  Laterality: N/A;   LUMBAR LAMINECTOMY  2006   SEPTOPLASTY N/A 09/28/2020   Procedure: SEPTOPLASTY;  Surgeon: Margaretha Sheffield, MD;  Location: [place];  Service: ENT;  Laterality: N/A;   TEE WITHOUT CARDIOVERSION N/A 09/22/2015   Procedure: TRANSESOPHAGEAL ECHOCARDIOGRAM (TEE);  Surgeon: Teodoro Spray, MD;  Location: [facility];  Service: Cardiovascular;  Laterality: N/A;   TURBINATE REDUCTION Bilateral 09/28/2020   Procedure: TURBINATE REDUCTION;  Surgeon: Margaretha Sheffield, MD;  Location: [place];  Service: ENT;  Laterality: Bilateral;    Family History  Problem Relation Age of Onset   Heart attack Mother    Hypertension Mother    Varicose Veins Mother    Diabetes Father    Heart attack Father  Heart attack Maternal Grandmother    Stroke Maternal Grandmother      Social History   Tobacco Use   Smoking status: Every Day    Packs/day: 1.00    Years: 45.00    Pack years: 45.00    Types: Cigarettes   Smokeless tobacco: Never   Tobacco comments:    09/21/20- currently 0.5 PPD  Vaping Use   Vaping Use: Never used  Substance Use Topics   Alcohol use: Yes    Comment: rare 3 a year   Drug use: No    Prior to Admission medications   Medication Sig Start Date End Date Taking? Authorizing Provider  amLODipine (NORVASC) 5 MG tablet Take 5 mg by mouth in the morning and at bedtime.   Yes [provider]  aspirin EC 81 MG tablet  Take 1 oz by mouth daily.   Yes [provider]  Coenzyme Q10 (COQ10) 100 MG CAPS Take by mouth daily.   Yes [provider]  Multiple Vitamins-Minerals (MULTIVITAMIN ADULT PO) Take 1 tablet by mouth daily.    Yes [provider]  Omega-3 Fatty Acids (FISH OIL PO) Take by mouth.   Yes [provider]  pantoprazole (PROTONIX) 40 MG tablet Take 1 tablet by mouth daily. 01/11/21 01/11/22 Yes [provider]  rosuvastatin (CRESTOR) 40 MG tablet Take 20 mg by mouth daily.   Yes [provider]  vitamin B-12 (CYANOCOBALAMIN) 1000 MCG tablet Take 1,000 mcg by mouth daily.   Yes [provider]  XARELTO 20 MG TABS tablet Take 20 mg by mouth daily. 12/12/20  Yes [provider]  celecoxib (CELEBREX) 200 MG capsule Take 200 mg by mouth daily. Patient not taking: Reported on 01/29/2021 09/12/15   [provider]  cephALEXin (KEFLEX) 500 MG capsule Take 1 capsule (500 mg total) by mouth 2 (two) times daily. Patient not taking: No sig reported 09/28/20   Margaretha Sheffield, MD  nicotine (NICODERM CQ - DOSED IN MG/24 HOURS) 21 mg/24hr patch Place onto the skin. 11/22/15   [provider]  olmesartan-hydrochlorothiazide (BENICAR HCT) 20-12.5 MG tablet Take by mouth. 07/24/18 09/28/20  [provider]  predniSONE (DELTASONE) 10 MG tablet Start with 3 pills tomorrow. Taper over the next 6 days.  3,3,2,2,1,1. Patient not taking: No sig reported 09/28/20   Margaretha Sheffield, MD    Current Facility-Administered Medications  Medication Dose Route Frequency Provider Last Rate Last Admin   acetaminophen (TYLENOL) tablet 650 mg  650 mg Oral Q6H PRN Para Skeans, MD       Or   acetaminophen (TYLENOL) suppository 650 mg  650 mg Rectal Q6H PRN Para Skeans, MD       aspirin EC tablet 81 mg  81 mg Oral Daily Florina Ou V, MD   81 mg at 01/30/21 0940   furosemide (LASIX) injection 20 mg  20 mg Intravenous Q12H Hall, Carole N, DO        heparin ADULT infusion 100 units/mL (25000 units/266m)  1,000 Units/hr Intravenous Continuous BRenda Rolls RPH 10 mL/hr at 01/30/21 0747 1,000 Units/hr at 01/30/21 0747   hydrALAZINE (APRESOLINE) injection 5 mg  5 mg Intravenous Q4H PRN PPara Skeans MD       methylPREDNISolone sodium succinate (SOLU-MEDROL) 40 mg/mL injection 40 mg  40 mg Intravenous Q12H PFlorina OuV, MD   40 mg at 01/30/21 0600   metoprolol tartrate (LOPRESSOR) tablet 12.5 mg  12.5 mg Oral BID HKayleen Memos  DO   12.5 mg at 01/30/21 1547   morphine 2 MG/ML injection 2 mg  2 mg Intravenous Q2H PRN Para Skeans, MD       nicotine (NICODERM CQ - dosed in mg/24 hours) patch 14 mg  14 mg Transdermal Daily Florina Ou V, MD   14 mg at 01/30/21 0941   ondansetron (ZOFRAN) tablet 4 mg  4 mg Oral Q6H PRN Para Skeans, MD       Or   ondansetron [facility]) injection 4 mg  4 mg Intravenous Q6H PRN Para Skeans, MD       pantoprazole (PROTONIX) injection 40 mg  40 mg Intravenous Q24H Florina Ou V, MD   40 mg at 01/30/21 0600   rosuvastatin (CRESTOR) tablet 20 mg  20 mg Oral Daily Florina Ou V, MD   20 mg at 01/30/21 0940   sodium chloride flush (NS) 0.9 % injection 3 mL  3 mL Intravenous Q12H Para Skeans, MD   3 mL at 01/29/21 2335   Current Outpatient Medications  Medication Sig Dispense Refill   amLODipine (NORVASC) 5 MG tablet Take 5 mg by mouth in the morning and at bedtime.     aspirin EC 81 MG tablet Take 1 oz by mouth daily.     Coenzyme Q10 (COQ10) 100 MG CAPS Take by mouth daily.     Multiple Vitamins-Minerals (MULTIVITAMIN ADULT PO) Take 1 tablet by mouth daily.      Omega-3 Fatty Acids (FISH OIL PO) Take by mouth.     pantoprazole (PROTONIX) 40 MG tablet Take 1 tablet by mouth daily.     rosuvastatin (CRESTOR) 40 MG tablet Take 20 mg by mouth daily.     vitamin B-12 (CYANOCOBALAMIN) 1000 MCG tablet Take 1,000 mcg by mouth daily.     XARELTO 20 MG TABS tablet Take 20 mg by mouth daily.     celecoxib (CELEBREX)  200 MG capsule Take 200 mg by mouth daily. (Patient not taking: Reported on 01/29/2021)  11   cephALEXin (KEFLEX) 500 MG capsule Take 1 capsule (500 mg total) by mouth 2 (two) times daily. (Patient not taking: No sig reported) 14 capsule 0   nicotine (NICODERM CQ - DOSED IN MG/24 HOURS) 21 mg/24hr patch Place onto the skin.     olmesartan-hydrochlorothiazide (BENICAR HCT) 20-12.5 MG tablet Take by mouth.     predniSONE (DELTASONE) 10 MG tablet Start with 3 pills tomorrow. Taper over the next 6 days.  3,3,2,2,1,1. (Patient not taking: No sig reported) 12 tablet 0    Allergies as of 01/29/2021   (No Known Allergies)     Review of Systems:    All systems reviewed and negative except where noted in HPI.  Review of Systems  Constitutional:  Positive for malaise/fatigue. Negative for chills and fever.  Respiratory:  Positive for cough and shortness of breath.   Cardiovascular:  Negative for chest pain.  Gastrointestinal:  Positive for melena. Negative for abdominal pain.  Musculoskeletal:  Positive for joint pain.  Skin:  Negative for rash.  Neurological:  Negative for focal weakness.  Psychiatric/Behavioral:  Negative for substance abuse.   All other systems reviewed and are negative.    Physical Exam:  Vital signs in last 24 hours: Pulse Rate:  [66-118] 75 (11/15 1700) Resp:  [12-34] 18 (11/15 1630) BP: (123-201)/(71-98) 143/71 (11/15 1700) SpO2:  [85 %-100 %] 100 % (11/15 1700) Weight:  [89.4 kg] 89.4 kg (11/15 1455)   General:  Pleasant in NAD Head:  Normocephalic and atraumatic. Eyes:   No icterus.   Conjunctiva pink. Mouth: Mucosa pink moist, no lesions. Neck:  Supple; no masses felt Lungs:  Mild respiratory distress Heart:  RRR Abdomen:   Flat, soft, nondistended, nontender.  Rectal:  No stool Msk:  Symmetrical without gross deformities. Neurologic:  Alert and  oriented x4;  Cranial nerves II-XII intact.  Skin:  Warm, dry, pink without significant lesions or  rashes. Psych:  Alert and cooperative. Normal affect.  LAB RESULTS: Recent Labs    01/29/21 1700 01/30/21 0410 01/30/21 0744 01/30/21 1459  WBC 8.9 4.1  --   --   HGB 10.9* 9.9* 10.0* 9.6*  HCT 33.1* 30.3* 29.8* 28.6*  PLT 218 188  --   --    BMET Recent Labs    01/29/21 1700 01/30/21 0410  NA 139 138  K 4.2 4.6  CL 108 109  CO2 21* 20*  GLUCOSE 104* 166*  BUN 30* 34*  CREATININE 2.23* 2.36*  CALCIUM 9.1 8.8*   LFT Recent Labs    01/29/21 1700  PROT 7.9  ALBUMIN 4.6  AST 29  ALT 25  ALKPHOS 85  BILITOT 1.7*   PT/INR Recent Labs    01/29/21 1700  LABPROT 21.5*  INR 1.9*    STUDIES: DG Chest 2 View  Result Date: 01/29/2021 CLINICAL DATA:  Shortness of breath, syncope EXAM: CHEST - 2 VIEW COMPARISON:  11/11/2015 FINDINGS: Trace bilateral pleural effusions. Bibasilar atelectasis, right greater than left. Heart is normal size. No acute bony abnormality. IMPRESSION: Bibasilar atelectasis and trace bilateral effusions. Electronically Signed   By: Rolm Baptise M.D.   On: 01/29/2021 17:27   US Venous Img Lower Bilateral  Result Date: 01/29/2021 CLINICAL DATA:  Bilateral leg swelling. Possible pulmonary embolus but unable to obtain chest CT angio due to renal function. Assess for DVT. EXAM: BILATERAL LOWER EXTREMITY VENOUS DOPPLER ULTRASOUND TECHNIQUE: Gray-scale sonography with compression, as well as color and duplex ultrasound, were performed to evaluate the deep venous system(s) from the level of the common femoral vein through the popliteal and proximal calf veins. COMPARISON:  None. FINDINGS: VENOUS Normal compressibility of the common femoral, superficial femoral, and popliteal veins, as well as the visualized calf veins. Visualized portions of profunda femoral vein and great saphenous vein unremarkable. No filling defects to suggest DVT on grayscale or color Doppler imaging. Doppler waveforms show normal direction of venous flow, normal respiratory plasticity  and response to augmentation. OTHER None. Limitations: none IMPRESSION: No evidence of bilateral lower extremity DVT. Electronically Signed   By: Keith Rake M.D.   On: 01/29/2021 22:52   ECHOCARDIOGRAM COMPLETE  Result Date: 01/30/2021    ECHOCARDIOGRAM REPORT   Patient Name:   Gary A Phil Dopp. Date of Exam: 01/30/2021 Medical Rec #:  [id number]             Height:       73.0 in Accession #:    [id number]            Weight:       197.0 lb Date of Birth:  1950-11-28             BSA:          2.138 m Patient Age:    73 years              BP:           157/87 mmHg Patient Gender: M  HR:           87 bpm. Exam Location:  [facility] Procedure: 2D Echo, Color Doppler and Cardiac Doppler Indications:     I50.21 congestive heart failure-Acute Systolic  History:         Patient has prior history of Echocardiogram examinations. PAD                  and Stroke, Signs/Symptoms:Shortness of Breath and Edema; Risk                  Factors:Dyslipidemia.  Sonographer:     Charmayne Sheer Referring Phys:  [id number] Gretta Cool PATEL Diagnosing Phys: Yolonda Kida MD  Sonographer Comments: Suboptimal parasternal window. IMPRESSIONS  1. Left ventricular ejection fraction, by estimation, is 25 to 30%. The left ventricle has severely decreased function. The left ventricle demonstrates global hypokinesis. The left ventricular internal cavity size was mildly dilated. Left ventricular diastolic parameters are consistent with Grade II diastolic dysfunction (pseudonormalization).  2. Right ventricular systolic function is normal. The right ventricular size is normal.  3. Left atrial size was mildly dilated.  4. The mitral valve is normal in structure. Trivial mitral valve regurgitation.  5. The aortic valve is grossly normal. Aortic valve regurgitation is not visualized. FINDINGS  Left Ventricle: Left ventricular ejection fraction, by estimation, is 25 to 30%. The left ventricle has severely decreased function. The left  ventricle demonstrates global hypokinesis. The left ventricular internal cavity size was mildly dilated. There is no left ventricular hypertrophy. Left ventricular diastolic parameters are consistent with Grade II diastolic dysfunction (pseudonormalization). Right Ventricle: The right ventricular size is normal. No increase in right ventricular wall thickness. Right ventricular systolic function is normal. Left Atrium: Left atrial size was mildly dilated. Right Atrium: Right atrial size was normal in size. Pericardium: There is no evidence of pericardial effusion. Mitral Valve: The mitral valve is normal in structure. Trivial mitral valve regurgitation. MV peak gradient, 3.0 mmHg. The mean mitral valve gradient is 2.0 mmHg. Tricuspid Valve: The tricuspid valve is normal in structure. Tricuspid valve regurgitation is trivial. Aortic Valve: The aortic valve is grossly normal. Aortic valve regurgitation is not visualized. Aortic valve mean gradient measures 3.0 mmHg. Aortic valve peak gradient measures 4.0 mmHg. Aortic valve area, by VTI measures 2.52 cm. Pulmonic Valve: The pulmonic valve was normal in structure. Pulmonic valve regurgitation is not visualized. Aorta: The ascending aorta was not well visualized. IAS/Shunts: No atrial level shunt detected by color flow Doppler. Additional Comments: There is no pleural effusion.  LEFT VENTRICLE PLAX 2D LVIDd:         5.00 cm      Diastology LVIDs:         4.30 cm      LV e' medial:    4.68 cm/s LV PW:         1.20 cm      LV E/e' medial:  19.9 LV IVS:        1.00 cm      LV e' lateral:   7.40 cm/s LVOT diam:     2.10 cm      LV E/e' lateral: 12.6 LV SV:         59 LV SV Index:   28 LVOT Area:     3.46 cm  LV Volumes (MOD) LV vol d, MOD A2C: 175.0 ml LV vol d, MOD A4C: 120.0 ml LV vol s, MOD A2C: 126.0 ml LV vol s, MOD A4C: 91.3  ml LV SV MOD A2C:     49.0 ml LV SV MOD A4C:     120.0 ml LV SV MOD BP:      35.8 ml RIGHT VENTRICLE RV Basal diam:  2.90 cm LEFT ATRIUM              Index        RIGHT ATRIUM           Index LA diam:        4.10 cm 1.92 cm/m   RA Area:     12.60 cm LA Vol (A2C):   52.5 ml 24.56 ml/m  RA Volume:   29.40 ml  13.75 ml/m LA Vol (A4C):   64.0 ml 29.94 ml/m LA Biplane Vol: 58.3 ml 27.27 ml/m  AORTIC VALVE                    PULMONIC VALVE AV Area (Vmax):    3.14 cm     PV Vmax:       0.94 m/s AV Area (Vmean):   2.66 cm     PV Vmean:      69.500 cm/s AV Area (VTI):     2.52 cm     PV VTI:        0.183 m AV Vmax:           100.00 cm/s  PV Peak grad:  3.5 mmHg AV Vmean:          79.500 cm/s  PV Mean grad:  2.0 mmHg AV VTI:            0.235 m AV Peak Grad:      4.0 mmHg AV Mean Grad:      3.0 mmHg LVOT Vmax:         90.70 cm/s LVOT Vmean:        61.100 cm/s LVOT VTI:          0.171 m LVOT/AV VTI ratio: 0.73  AORTA Ao Root diam: 3.20 cm MITRAL VALVE MV Area (PHT): 6.83 cm    SHUNTS MV Area VTI:   2.93 cm    Systemic VTI:  0.17 m MV Peak grad:  3.0 mmHg    Systemic Diam: 2.10 cm MV Mean grad:  2.0 mmHg MV Vmax:       0.87 m/s MV Vmean:      72.3 cm/s MV Decel Time: 111 msec MV E velocity: 93.00 cm/s MV A velocity: 88.30 cm/s MV E/A ratio:  1.05 Dwayne D Callwood MD Electronically signed by Yolonda Kida MD Signature Date/Time: 01/30/2021/2:12:21 PM    Final        Impression / Plan:   70 y/o gentleman with history of PVD on NOAC here for heart failure exacerbation, also with IDA. Given he's been on NOAC for several months while having dark stools and heparin drip has not caused worsening bleeding, then does not seem like he has a high risk lesion. He could have gastritis/duodenitis on mild IBD such as UC given reports of tenesmus. He'll need an EGD/Colonoscopy for evaluation but given active cardiac issues the question will be timing.  - will need EGD/Colonoscopy once heart failure exacerbation has been adequately treated per cardiology - cardiology risk assessment - ok to continue heparin drip but will need to be discontinued prior to any  endoscopic procedures for 6 hours - maintain active type and screen and transfuse for hemoglobin < 8 - PPI daily IV - ok for clear  liquids  We will continue to follow closely, please call with any questions or concerns.  Raylene Miyamoto MD, MPH [place]

## 2021-01-30 NOTE — Progress Notes (Signed)
[place] for heparin infusion monitoring and titration Indication: chest pain/ACS  No Known Allergies  Patient Measurements: 89.8 kg (from 01/24/21 office visit)    Vital Signs: BP: 154/83 (11/15 0430) Pulse Rate: 81 (11/15 0430)  Labs: Recent Labs    01/29/21 1700 01/29/21 2117 01/30/21 0410  HGB 10.9*  --  9.9*  HCT 33.1*  --  30.3*  PLT 218  --  188  APTT 52*  --  >160*  LABPROT 21.5*  --   --   INR 1.9*  --   --   HEPARINUNFRC  --  >1.10* >1.10*  CREATININE 2.23*  --  2.36*  TROPONINIHS 126* 145* 126*     CrCl cannot be calculated (Unknown ideal weight.).   Medical History: Past Medical History:  Diagnosis Date   Arthritis    lower back   Back pain    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    Complication of anesthesia    Violent with hallucinations after procedure 10/2015   DDD (degenerative disc disease), lumbar    Duodenal ulcer    Elevated lipids    GERD (gastroesophageal reflux disease)    Leg weakness, bilateral    s/p lumbar surgery   PAD (peripheral artery disease) ([facility])    Polyradiculitis    Stroke ([place])    Rt lower leg  estimated 10 - 12 stokes in last 5 years   Wears dentures    full upper and lower    Medications:  Scheduled:   aspirin EC  81 mg Oral Daily   furosemide  20 mg Intravenous Q12H   methylPREDNISolone (SOLU-MEDROL) injection  40 mg Intravenous Q12H   nicotine  14 mg Transdermal Daily   pantoprazole (PROTONIX) IV  40 mg Intravenous Q24H   rosuvastatin  20 mg Oral Daily   sodium chloride flush  3 mL Intravenous Q12H    Assessment: 70 y.o. male with history of peripheral artery disease, hypertension, embolic CVA, hyperlipidemia, here with progressively worsening shortness of breath, cough, and syncope. He takes rivaroxaban at home with last dose 01/28/21. Additionally MD notes  pleuritic pain which could suggest possible PE with scans pending.  Baseline aPTT 52 seconds at 1700 01/29/21  Goal of  Therapy:  Heparin level 0.3-0.7 units/ml aPTT 66 - 102 seconds Monitor platelets by anticoagulation protocol: Yes  11/15 0410 aPTT > 160 supra, HL >1.10 supra   Plan:  Careers information officer.  Levels drawn by lab tech. Hold heparin infusion for 1 hour Restart heparin infusion at 1000 units/hr Will recheck aPTT level in 6 hr after restart Continue to monitor H&H and platelets  Renda Rolls, PharmD, [facility] 01/30/2021 6:48 AM

## 2021-01-30 NOTE — Consult Note (Signed)
CARDIOLOGY CONSULT NOTE               Patient ID: Gary Mccoy. MRN: [id number] DOB/AGE: 1950/08/28 70 y.o.  Admit date: 01/29/2021 Referring Physician Dr. Woodroe Mode hospitalist Primary Physician Dr. Emily Filbert primary Primary Cardiologist  Reason for Consultation shortness of breath lower extremity edema congestive heart failure  HPI: Patient is a 70 year old male smoker COPD presents with shortness of breath dyspnea lower extremity edema has significant peripheral vascular disease hyperlipidemia hypertension.  He has had congestion over the last several days and is being treated with antibiotics and respiratory therapy but symptoms persisted he denies presents to the emergency room with significant dyspnea shortness of breath denies much in the way of chest pain.  Patient had PND and orthopnea lower extremity edema congestion patient has never had any significant cardiac evaluation.  He initially presented with respiratory failure requiring BiPAP and is since improved with diuretics he has had previous CVA and lower extremity edema feels like he is not able to get a deep breath he is also had some melena and was admitted in the emergency room for further evaluation  Review of systems complete and found to be negative unless listed above     Past Medical History:  Diagnosis Date   Arthritis    lower back   Back pain    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    Complication of anesthesia    Violent with hallucinations after procedure 10/2015   DDD (degenerative disc disease), lumbar    Duodenal ulcer    Elevated lipids    GERD (gastroesophageal reflux disease)    Leg weakness, bilateral    s/p lumbar surgery   PAD (peripheral artery disease) ([place])    Polyradiculitis    Stroke ([place])    Rt lower leg  estimated 10 - 12 stokes in last 5 years   Wears dentures    full upper and lower    Past Surgical History:  Procedure Laterality Date   AORTA - BILATERAL FEMORAL ARTERY BYPASS  GRAFT N/A 11/08/2015   Procedure: AORTA BIFEMORAL BYPASS GRAFT;  Surgeon: Katha Cabal, MD;  Location: [facility];  Service: Vascular;  Laterality: N/A;   BACK SURGERY  2005   CERVICAL [place]   COLONOSCOPY WITH PROPOFOL N/A 12/30/2017   Procedure: COLONOSCOPY WITH PROPOFOL;  Surgeon: Toledo, Benay Pike, MD;  Location: [facility];  Service: Gastroenterology;  Laterality: N/A;   ENDARTERECTOMY FEMORAL Bilateral 11/08/2015   Procedure: ENDARTERECTOMY FEMORAL;  Surgeon: Katha Cabal, MD;  Location: [facility];  Service: Vascular;  Laterality: Bilateral;  common, SFA, and profundis  Aortic endarterectomy    ESOPHAGOGASTRODUODENOSCOPY (EGD) WITH PROPOFOL N/A 09/04/2015   Procedure: ESOPHAGOGASTRODUODENOSCOPY (EGD) WITH PROPOFOL;  Surgeon: Lucilla Lame, MD;  Location: [place];  Service: Endoscopy;  Laterality: N/A;   LUMBAR LAMINECTOMY  2006   SEPTOPLASTY N/A 09/28/2020   Procedure: SEPTOPLASTY;  Surgeon: Margaretha Sheffield, MD;  Location: [place];  Service: ENT;  Laterality: N/A;   TEE WITHOUT CARDIOVERSION N/A 09/22/2015   Procedure: TRANSESOPHAGEAL ECHOCARDIOGRAM (TEE);  Surgeon: Teodoro Spray, MD;  Location: [facility];  Service: Cardiovascular;  Laterality: N/A;   TURBINATE REDUCTION Bilateral 09/28/2020   Procedure: TURBINATE REDUCTION;  Surgeon: Margaretha Sheffield, MD;  Location: [place];  Service: ENT;  Laterality: Bilateral;    (Not in a hospital admission)  Social History   Socioeconomic History   Marital status: Married    Spouse name: Not on  file   Number of children: Not on file   Years of education: Not on file   Highest education level: Not on file  Occupational History   Not on file  Tobacco Use   Smoking status: Every Day    Packs/day: 1.00    Years: 45.00    Pack years: 45.00    Types: Cigarettes   Smokeless tobacco: Never   Tobacco comments:    09/21/20- currently 0.5 PPD  Vaping Use   Vaping Use: Never used  Substance and  Sexual Activity   Alcohol use: Yes    Comment: rare 3 a year   Drug use: No   Sexual activity: Not on file  Other Topics Concern   Not on file  Social History Narrative   Not on file   Social Determinants of Health   Financial Resource Strain: Not on file  Food Insecurity: Not on file  Transportation Needs: Not on file  Physical Activity: Not on file  Stress: Not on file  Social Connections: Not on file  Intimate Partner Violence: Not on file    Family History  Problem Relation Age of Onset   Heart attack Mother    Hypertension Mother    Varicose Veins Mother    Diabetes Father    Heart attack Father    Heart attack Maternal Grandmother    Stroke Maternal Grandmother       Review of systems complete and found to be negative unless listed above      PHYSICAL EXAM  General: Well developed, well nourished, in no acute distress HEENT:  Normocephalic and atramatic Neck:  No JVD.  Lungs: Diminished bilaterally to auscultation and percussion.  Diffuse rhonchi Heart: HRRR . Normal S1 and S2 without gallops or murmurs.  Abdomen: Bowel sounds are positive, abdomen soft and non-tender  Msk:  Back normal, normal gait. Normal strength and tone for age. Extremities: No clubbing, cyanosis or edema.   Neuro: Alert and oriented X 3. Psych:  Good affect, responds appropriately  Labs:   Lab Results  Component Value Date   WBC 4.1 01/30/2021   HGB 9.6 (L) 01/30/2021   HCT 28.6 (L) 01/30/2021   MCV 89.4 01/30/2021   PLT 188 01/30/2021    Recent Labs  Lab 01/29/21 1700 01/30/21 0410  NA 139 138  K 4.2 4.6  CL 108 109  CO2 21* 20*  BUN 30* 34*  CREATININE 2.23* 2.36*  CALCIUM 9.1 8.8*  PROT 7.9  --   BILITOT 1.7*  --   ALKPHOS 85  --   ALT 25  --   AST 29  --   GLUCOSE 104* 166*   Lab Results  Component Value Date   CKTOTAL 313 09/19/2015   TROPONINI <0.03 02/26/2018    Lab Results  Component Value Date   CHOL 160 02/26/2018   CHOL 175 09/20/2015   Lab  Results  Component Value Date   HDL 35 (L) 02/26/2018   HDL 39 (L) 09/20/2015   Lab Results  Component Value Date   LDLCALC 102 (H) 02/26/2018   LDLCALC 116 (H) 09/20/2015   Lab Results  Component Value Date   TRIG 117 02/26/2018   TRIG 98 09/20/2015   Lab Results  Component Value Date   CHOLHDL 4.6 02/26/2018   CHOLHDL 4.5 09/20/2015   No results found for: LDLDIRECT    Radiology: DG Chest 2 View  Result Date: 01/29/2021 CLINICAL DATA:  Shortness of breath, syncope EXAM: CHEST -  2 VIEW COMPARISON:  11/11/2015 FINDINGS: Trace bilateral pleural effusions. Bibasilar atelectasis, right greater than left. Heart is normal size. No acute bony abnormality. IMPRESSION: Bibasilar atelectasis and trace bilateral effusions. Electronically Signed   By: Rolm Baptise M.D.   On: 01/29/2021 17:27   US Venous Img Lower Bilateral  Result Date: 01/29/2021 CLINICAL DATA:  Bilateral leg swelling. Possible pulmonary embolus but unable to obtain chest CT angio due to renal function. Assess for DVT. EXAM: BILATERAL LOWER EXTREMITY VENOUS DOPPLER ULTRASOUND TECHNIQUE: Gray-scale sonography with compression, as well as color and duplex ultrasound, were performed to evaluate the deep venous system(s) from the level of the common femoral vein through the popliteal and proximal calf veins. COMPARISON:  None. FINDINGS: VENOUS Normal compressibility of the common femoral, superficial femoral, and popliteal veins, as well as the visualized calf veins. Visualized portions of profunda femoral vein and great saphenous vein unremarkable. No filling defects to suggest DVT on grayscale or color Doppler imaging. Doppler waveforms show normal direction of venous flow, normal respiratory plasticity and response to augmentation. OTHER None. Limitations: none IMPRESSION: No evidence of bilateral lower extremity DVT. Electronically Signed   By: Keith Rake M.D.   On: 01/29/2021 22:52   ECHOCARDIOGRAM COMPLETE  Result  Date: 01/30/2021    ECHOCARDIOGRAM REPORT   Patient Name:   Art A Phil Mccoy. Date of Exam: 01/30/2021 Medical Rec #:  [id number]             Height:       73.0 in Accession #:    [id number]            Weight:       197.0 lb Date of Birth:  Nov 27, 1950             BSA:          2.138 m Patient Age:    49 years              BP:           157/87 mmHg Patient Gender: M                     HR:           87 bpm. Exam Location:  [facility] Procedure: 2D Echo, Color Doppler and Cardiac Doppler Indications:     I50.21 congestive heart failure-Acute Systolic  History:         Patient has prior history of Echocardiogram examinations. PAD                  and Stroke, Signs/Symptoms:Shortness of Breath and Edema; Risk                  Factors:Dyslipidemia.  Sonographer:     Charmayne Sheer Referring Phys:  [id number] Gretta Cool PATEL Diagnosing Phys: Yolonda Kida MD  Sonographer Comments: Suboptimal parasternal window. IMPRESSIONS  1. Left ventricular ejection fraction, by estimation, is 25 to 30%. The left ventricle has severely decreased function. The left ventricle demonstrates global hypokinesis. The left ventricular internal cavity size was mildly dilated. Left ventricular diastolic parameters are consistent with Grade II diastolic dysfunction (pseudonormalization).  2. Right ventricular systolic function is normal. The right ventricular size is normal.  3. Left atrial size was mildly dilated.  4. The mitral valve is normal in structure. Trivial mitral valve regurgitation.  5. The aortic valve is grossly normal. Aortic valve regurgitation is not visualized. FINDINGS  Left Ventricle: Left ventricular ejection fraction,  by estimation, is 25 to 30%. The left ventricle has severely decreased function. The left ventricle demonstrates global hypokinesis. The left ventricular internal cavity size was mildly dilated. There is no left ventricular hypertrophy. Left ventricular diastolic parameters are consistent with Grade II diastolic  dysfunction (pseudonormalization). Right Ventricle: The right ventricular size is normal. No increase in right ventricular wall thickness. Right ventricular systolic function is normal. Left Atrium: Left atrial size was mildly dilated. Right Atrium: Right atrial size was normal in size. Pericardium: There is no evidence of pericardial effusion. Mitral Valve: The mitral valve is normal in structure. Trivial mitral valve regurgitation. MV peak gradient, 3.0 mmHg. The mean mitral valve gradient is 2.0 mmHg. Tricuspid Valve: The tricuspid valve is normal in structure. Tricuspid valve regurgitation is trivial. Aortic Valve: The aortic valve is grossly normal. Aortic valve regurgitation is not visualized. Aortic valve mean gradient measures 3.0 mmHg. Aortic valve peak gradient measures 4.0 mmHg. Aortic valve area, by VTI measures 2.52 cm. Pulmonic Valve: The pulmonic valve was normal in structure. Pulmonic valve regurgitation is not visualized. Aorta: The ascending aorta was not well visualized. IAS/Shunts: No atrial level shunt detected by color flow Doppler. Additional Comments: There is no pleural effusion.  LEFT VENTRICLE PLAX 2D LVIDd:         5.00 cm      Diastology LVIDs:         4.30 cm      LV e' medial:    4.68 cm/s LV PW:         1.20 cm      LV E/e' medial:  19.9 LV IVS:        1.00 cm      LV e' lateral:   7.40 cm/s LVOT diam:     2.10 cm      LV E/e' lateral: 12.6 LV SV:         59 LV SV Index:   28 LVOT Area:     3.46 cm  LV Volumes (MOD) LV vol d, MOD A2C: 175.0 ml LV vol d, MOD A4C: 120.0 ml LV vol s, MOD A2C: 126.0 ml LV vol s, MOD A4C: 91.3 ml LV SV MOD A2C:     49.0 ml LV SV MOD A4C:     120.0 ml LV SV MOD BP:      35.8 ml RIGHT VENTRICLE RV Basal diam:  2.90 cm LEFT ATRIUM             Index        RIGHT ATRIUM           Index LA diam:        4.10 cm 1.92 cm/m   RA Area:     12.60 cm LA Vol (A2C):   52.5 ml 24.56 ml/m  RA Volume:   29.40 ml  13.75 ml/m LA Vol (A4C):   64.0 ml 29.94 ml/m LA  Biplane Vol: 58.3 ml 27.27 ml/m  AORTIC VALVE                    PULMONIC VALVE AV Area (Vmax):    3.14 cm     PV Vmax:       0.94 m/s AV Area (Vmean):   2.66 cm     PV Vmean:      69.500 cm/s AV Area (VTI):     2.52 cm     PV VTI:        0.183 m AV Vmax:  100.00 cm/s  PV Peak grad:  3.5 mmHg AV Vmean:          79.500 cm/s  PV Mean grad:  2.0 mmHg AV VTI:            0.235 m AV Peak Grad:      4.0 mmHg AV Mean Grad:      3.0 mmHg LVOT Vmax:         90.70 cm/s LVOT Vmean:        61.100 cm/s LVOT VTI:          0.171 m LVOT/AV VTI ratio: 0.73  AORTA Ao Root diam: 3.20 cm MITRAL VALVE MV Area (PHT): 6.83 cm    SHUNTS MV Area VTI:   2.93 cm    Systemic VTI:  0.17 m MV Peak grad:  3.0 mmHg    Systemic Diam: 2.10 cm MV Mean grad:  2.0 mmHg MV Vmax:       0.87 m/s MV Vmean:      72.3 cm/s MV Decel Time: 111 msec MV E velocity: 93.00 cm/s MV A velocity: 88.30 cm/s MV E/A ratio:  1.05 Hazley Dezeeuw D Elishia Kaczorowski MD Electronically signed by Yolonda Kida MD Signature Date/Time: 01/30/2021/2:12:21 PM    Final     EKG: Normal sinus rhythm nonspecific ST-T changes rate of 90  ASSESSMENT AND PLAN:  Shortness of breath Congestive heart failure Cardiomyopathy COPD Lower extremity edema Smoking Acute respiratory failure Acute on chronic renal insufficiency Peripheral vascular disease Hypertension History of CVA Melena GERD . Plan Agreed admit for evaluation of shortness of breath and probable heart failure Borderline troponins more consistent with demand ischemia not a non-STEMI Anticoagulation IV short-term for unstable angina DVT prophylaxis IV diuretic therapy for heart failure Agree with echocardiogram which shows severely depressed left ventricular function EF of 25 to 30% Recommend inhalers possibly steroids supplemental oxygen for respiratory failure Recommend nephrology input for renal insufficiency Agree with GI input for melena Patient may require cardiac cath because of new onset heart  failure possible unstable angina Moderate to high-dose statin therapy peripheral vascular disease previous CVA probable coronary disease with Crestor 40 Continue Protonix for reflux type symptoms Hold anticoagulants except heparin including Xarelto Have the patient refrain from tobacco abuse Continue Benicar consider increasing the dose or switching to [employer]  Signed: Yolonda Kida MD 01/30/2021, 8:08 PM

## 2021-01-30 NOTE — ED Notes (Signed)
Pt assisted with standing at bedside to use urinal. HR increased to 118 bpm, pt elected to take of [place] while urinating and O2 decreased to 85%, within 30 seconds back on [place] pt O2 sat increased to 95%

## 2021-01-30 NOTE — Progress Notes (Signed)
ANTICOAGULATION CONSULT NOTE  Pharmacy Consult for heparin infusion monitoring and titration Indication: chest pain/ACS  No Known Allergies  Patient Measurements: 89.8 kg (from 01/24/21 office visit) Weight: 89.4 kg (197 lb)  Vital Signs: BP: 149/78 (11/15 1430) Pulse Rate: 80 (11/15 1430)  Labs: Recent Labs    01/29/21 1700 01/29/21 2117 01/30/21 0410 01/30/21 0744 01/30/21 1459  HGB 10.9*  --  9.9* 10.0* 9.6*  HCT 33.1*  --  30.3* 29.8* 28.6*  PLT 218  --  188  --   --   APTT 52*  --  >160*  --  92*  LABPROT 21.5*  --   --   --   --   INR 1.9*  --   --   --   --   HEPARINUNFRC  --  >1.10* >1.10*  --   --   CREATININE 2.23*  --  2.36*  --   --   TROPONINIHS 126* 145* 126*  --   --      Estimated Creatinine Clearance: 33.4 mL/min (A) (by C-G formula based on SCr of 2.36 mg/dL (H)).   Medical History: Past Medical History:  Diagnosis Date   Arthritis    lower back   Back pain    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    Complication of anesthesia    Violent with hallucinations after procedure 10/2015   DDD (degenerative disc disease), lumbar    Duodenal ulcer    Elevated lipids    GERD (gastroesophageal reflux disease)    Leg weakness, bilateral    s/p lumbar surgery   PAD (peripheral artery disease) ([facility])    Polyradiculitis    Stroke ([place])    Rt lower leg  estimated 10 - 12 stokes in last 5 years   Wears dentures    full upper and lower    Medications:  Scheduled:   aspirin EC  81 mg Oral Daily   furosemide  20 mg Intravenous Q12H   methylPREDNISolone (SOLU-MEDROL) injection  40 mg Intravenous Q12H   metoprolol tartrate  12.5 mg Oral BID   nicotine  14 mg Transdermal Daily   pantoprazole (PROTONIX) IV  40 mg Intravenous Q24H   rosuvastatin  20 mg Oral Daily   sodium chloride flush  3 mL Intravenous Q12H    Assessment: 70 y.o. male with history of peripheral artery disease, hypertension, embolic CVA, hyperlipidemia, here with progressively worsening shortness  of breath, cough, and syncope. He takes rivaroxaban at home with last dose 01/28/21. Additionally MD notes  pleuritic pain which could suggest possible PE with scans pending.  Baseline aPTT 52 seconds at 1700 01/29/21  Goal of Therapy:  Heparin level 0.3-0.7 units/ml aPTT 66 - 102 seconds Monitor platelets by anticoagulation protocol: Yes  11/15 0410 aPTT > 160 supra, HL >1.10 supra 11/15 1459 aPTT 92, therapeutic x1   Plan:  aPTT therapeutic. Will continue heparin infusion at 1000 units/hr Will recheck aPTT level in 6 hr to confirm Recheck HL with AM labs and switch to monitoring HL once correlating with aPTT Continue to monitor H&H and platelets  Sherilyn Banker, PharmD Clinical Pharmacist 01/30/2021 3:38 PM

## 2021-01-30 NOTE — Progress Notes (Signed)
*  PRELIMINARY RESULTS* Echocardiogram 2D Echocardiogram has been performed.  Wallie Char Rich Paprocki 01/30/2021, 1:11 PM

## 2021-01-30 NOTE — Progress Notes (Signed)
ANTICOAGULATION CONSULT NOTE  Pharmacy Consult for heparin infusion monitoring and titration Indication: chest pain/ACS  No Known Allergies  Patient Measurements: 89.8 kg (from 01/24/21 office visit) Weight: 89.4 kg (197 lb)  Vital Signs: BP: 162/82 (11/15 2132) Pulse Rate: 93 (11/15 2132)  Labs: Recent Labs    01/29/21 1700 01/29/21 2117 01/30/21 0410 01/30/21 0744 01/30/21 1459 01/30/21 2028  HGB 10.9*  --  9.9* 10.0* 9.6*  --   HCT 33.1*  --  30.3* 29.8* 28.6*  --   PLT 218  --  188  --   --   --   APTT 52*  --  >160*  --  92* 94*  LABPROT 21.5*  --   --   --   --   --   INR 1.9*  --   --   --   --   --   HEPARINUNFRC  --  >1.10* >1.10*  --   --   --   CREATININE 2.23*  --  2.36*  --   --   --   TROPONINIHS 126* 145* 126*  --   --   --      Estimated Creatinine Clearance: 33.4 mL/min (A) (by C-G formula based on SCr of 2.36 mg/dL (H)).   Medical History: Past Medical History:  Diagnosis Date   Arthritis    lower back   Back pain    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    Complication of anesthesia    Violent with hallucinations after procedure 10/2015   DDD (degenerative disc disease), lumbar    Duodenal ulcer    Elevated lipids    GERD (gastroesophageal reflux disease)    Leg weakness, bilateral    s/p lumbar surgery   PAD (peripheral artery disease) ([place])    Polyradiculitis    Stroke ([place])    Rt lower leg  estimated 10 - 12 stokes in last 5 years   Wears dentures    full upper and lower      Assessment: 70 y.o. male with history of peripheral artery disease, hypertension, embolic CVA, hyperlipidemia, here with progressively worsening shortness of breath, cough, and syncope. He takes rivaroxaban at home with last dose 01/28/21. Additionally MD notes  pleuritic pain which could suggest possible PE with scans pending.   Goal of Therapy:  Heparin level 0.3-0.7 units/ml aPTT 66 - 102 seconds Monitor platelets by anticoagulation protocol: Yes  11/15 0410 aPTT  > 160 supra, HL >1.10 supra 11/15 1459 aPTT 92, therapeutic x1 11/15 2028 aPTT 94, therapeutic x 2   Plan:  aPTT therapeutic Continue heparin infusion at 1000 units/hr Check aPTT and HL with morning labs, continue to follow aPTT until correlation with HL CBC daily while on heparin  Tawnya Crook, PharmD, BCPS Clinical Pharmacist 01/30/2021 10:08 PM

## 2021-01-30 NOTE — ED Notes (Signed)
Marcene Brawn RN aware of assigned bed

## 2021-01-30 NOTE — Progress Notes (Signed)
Pt taken off bipap and placed on 4lpm [place], sats 98%, toleration well, RN aware.

## 2021-01-30 NOTE — ED Notes (Signed)
Lab called to come draw ordered labs

## 2021-01-30 NOTE — Progress Notes (Signed)
PROGRESS NOTE  Gary A Phil Dopp. HEN:[id number] DOB: 19-Nov-1950 DOA: 01/29/2021 PCP: Rusty Aus, MD  HPI/Recap of past 24 hours: Gary A Rana Adorno. is a 70 y.o. male with past medical history significant of CVA, peripheral artery disease, placed on Xarelto by vascular surgery since 2017, chronic combined diastolic and systolic CHF, nonbleeding internal hemorrhoids, diverticulosis from colonoscopy done in 2019, gastritis, nonbleeding duodenal ulcer seen on EGD done in 2017, hyperlipidemia, essential hypertension, GERD who presented to [facility] ED with complaints of progressively worsening shortness of breath of 1 week duration, worse with exertion.  Endorses intermittent loose black stools for the past 6 weeks.  Denies use of NSAIDs.  Work-up revealed acute hypoxic respiratory failure secondary to acute CHF with concern for pulmonary edema.  Elevated troponin, was started on heparin drip and his home Xarelto was held.  Also revealed iron deficiency anemia.  Cardiology and GI consulted to assist with the management.  01/30/2021: Patient was seen and examined at his bedside in the ED.  Reports that shortness of breath is improved on oxygen supplementation, he is on 4 L nasal cannula.  Not on oxygen supplementation at baseline.  Assessment/Plan: Principal Problem:   Shortness of breath Active Problems:   Benign essential HTN   Duodenal ulcer disease   Tobacco abuse counseling   Peripheral vascular disease ([place])   Hyperlipidemia   CVA (cerebral vascular accident) ([place])   Acute respiratory failure with hypoxia ([place])   Melena   Anemia   Acute on chronic combined diastolic and systolic CHF Presented with dyspnea with minimal exertion, gradually worsening for the past week, elevated BNP greater than 1000, pulmonary edema seen on chest x-ray. Last 2D echo done in 2019 revealed LVEF 45 to 50% with grade 1 diastolic dysfunction 2D echo done on 01/30/2021 showed LVEF 25 to 30%, left ventricle  shows global hypokinesis, grade 2 diastolic dysfunction. Start strict I's and O's and daily weight Continue home aspirin and Crestor Continue diuresing with IV Lasix 20 mg twice daily. Start Lopressor 12.5 mg twice daily Cardiology consulted for further management.  Elevated troponin, suspect demand ischemia in the setting of hypoxia from pulmonary edema Troponin on presentation 126, peaked at 145 and downtrending. He was started on heparin drip and his home Xarelto was held on admission. No evidence of acute ischemia on twelve-lead EKG. Continue to closely monitor on telemetry.  AKI on CKD 3B likely prerenal in the setting of dehydration from poor oral intake. Baseline creatinine appears to be 1.6 with GFR of 41 Presented with creatinine of 2.36 with GFR 29 Avoid nephrotoxic agents and hypotension Closely monitor renal function and electrolytes while on IV diuretics Closely monitor urine output with strict I's and O's Repeat BMP in the morning  Suspected upper GI bleed in the setting of intermittent melena History of gastritis/nonbleeding duodenal ulcer seen on EGD in 2017 History of nonbleeding internal hemorrhoids/diverticulosis seen on colonoscopy done in 2019. Iron deficiency anemia likely secondary to chronic blood loss Hemoglobin on presentation 9.9, trend Continue IV Protonix 40 mg daily. Transfuse hemoglobin less than 8.0 Closely monitor hemoglobin while on heparin drip. GI consulted to assist with the management  Acute hypoxic respiratory failure secondary to pulmonary edema, likely cardiogenic Personally reviewed chest x-ray done on admission which shows increasing pulmonary vascularity, bilateral pleural effusions. Currently on 4 L nasal cannula to maintain a saturation greater than 92% Not on oxygen supplementation at baseline Wean off oxygen supplementation as tolerated Continue diuresing Incentive spirometer Mobilize as  tolerated if okay with cardiology  Sinus  tachycardia, suspect driven by lung physiology Obtain TSH Continue to treat underlying conditions Monitor on telemetry Started on Lopressor 12.5 mg twice daily Closely monitor vital signs  Hyperlipidemia He is on Crestor 20 mg prior to admission Obtain fasting lipid panel in the morning  Critical care time: 65 minutes.    Code Status: DNR  Family Communication: None at bedside  Disposition Plan: Likely will discharge to home once cardiology and GI signed off.   Consultants: Cardiology GI  Procedures: 2D echo  Antimicrobials: None  DVT prophylaxis: Heparin drip  Status is: Inpatient  Inpatient status.  Patient will require at least 2 midnights for further evaluation and treatment of present condition.      Objective: Vitals:   01/30/21 1330 01/30/21 1400 01/30/21 1430 01/30/21 1455  BP: (!) 162/87 (!) 164/87 (!) 149/78   Pulse: 84 85 80   Resp: _0 Temp:      SpO2: 95% 97% 94%   Weight:    89.4 kg    Intake/Output Summary (Last 24 hours) at 01/30/2021 1507 Last data filed at 01/30/2021 1100 Gross per 24 hour  Intake 107.95 ml  Output 900 ml  Net -792.05 ml   Filed Weights   01/30/21 1455  Weight: 89.4 kg    Exam:  General: 70 y.o. year-old male well developed well nourished in no acute distress.  Alert and oriented x3. Cardiovascular: Tachycardic with no rubs or gallops.  No thyromegaly or JVD noted.   Respiratory: Mild diffuse rales bilaterally. Good inspiratory effort. Abdomen: Soft nontender nondistended with normal bowel sounds x4 quadrants. Musculoskeletal: Trace lower extremity edema. 2/4 pulses in all 4 extremities. Skin: No ulcerative lesions noted or rashes, Psychiatry: Mood is appropriate for condition and setting Neuro: Moves all 4 extremities equally.   Data Reviewed: CBC: Recent Labs  Lab 01/29/21 1700 01/30/21 0410 01/30/21 0744  WBC 8.9 4.1  --   NEUTROABS 7.3  --   --   HGB 10.9* 9.9* 10.0*  HCT 33.1* 30.3*  29.8*  MCV 88.3 89.4  --   PLT 218 188  --    Basic Metabolic Panel: Recent Labs  Lab 01/29/21 1700 01/30/21 0410  NA 139 138  K 4.2 4.6  CL 108 109  CO2 21* 20*  GLUCOSE 104* 166*  BUN 30* 34*  CREATININE 2.23* 2.36*  CALCIUM 9.1 8.8*  MG 2.0  --    GFR: Estimated Creatinine Clearance: 33.4 mL/min (A) (by C-G formula based on SCr of 2.36 mg/dL (H)). Liver Function Tests: Recent Labs  Lab 01/29/21 1700  AST 29  ALT 25  ALKPHOS 85  BILITOT 1.7*  PROT 7.9  ALBUMIN 4.6   No results for input(s): LIPASE, AMYLASE in the last 168 hours. No results for input(s): AMMONIA in the last 168 hours. Coagulation Profile: Recent Labs  Lab 01/29/21 1700  INR 1.9*   Cardiac Enzymes: No results for input(s): CKTOTAL, CKMB, CKMBINDEX, TROPONINI in the last 168 hours. BNP (last 3 results) No results for input(s): PROBNP in the last 8760 hours. HbA1C: Recent Labs    01/29/21 2117  HGBA1C 5.2   CBG: No results for input(s): GLUCAP in the last 168 hours. Lipid Profile: No results for input(s): CHOL, HDL, LDLCALC, TRIG, CHOLHDL, LDLDIRECT in the last 72 hours. Thyroid Function Tests: Recent Labs    01/29/21 2117  TSH 0.720  FREET4 1.22*   Anemia Panel: Recent Labs    01/30/21  0410  VITAMINB12 531  FOLATE 16.9  FERRITIN 193  TIBC 294  IRON 18*  RETICCTPCT 1.9   Urine analysis:    Component Value Date/Time   COLORURINE STRAW (A) 02/26/2018 1617   APPEARANCEUR CLEAR (A) 02/26/2018 1617   LABSPEC 1.009 02/26/2018 1617   PHURINE 5.0 02/26/2018 1617   GLUCOSEU NEGATIVE 02/26/2018 1617   HGBUR SMALL (A) 02/26/2018 1617   BILIRUBINUR NEGATIVE 02/26/2018 1617   KETONESUR NEGATIVE 02/26/2018 1617   PROTEINUR NEGATIVE 02/26/2018 1617   NITRITE NEGATIVE 02/26/2018 1617   LEUKOCYTESUR NEGATIVE 02/26/2018 1617   Sepsis Labs: _0 (procalcitonin:4,lacticidven:4)  ) Recent Results (from the past 240 hour(s))  Resp Panel by RT-PCR (Flu A&B, Covid) Nasopharyngeal  Swab     Status: None   Collection Time: 01/29/21  9:17 PM   Specimen: Nasopharyngeal Swab; Nasopharyngeal(NP) swabs in vial transport medium  Result Value Ref Range Status   SARS Coronavirus 2 by RT PCR NEGATIVE NEGATIVE Final    Comment: (NOTE) SARS-CoV-2 target nucleic acids are NOT DETECTED.  The SARS-CoV-2 RNA is generally detectable in upper respiratory specimens during the acute phase of infection. The lowest concentration of SARS-CoV-2 viral copies this assay can detect is 138 copies/mL. A negative result does not preclude SARS-Cov-2 infection and should not be used as the sole basis for treatment or other patient management decisions. A negative result may occur with  improper specimen collection/handling, submission of specimen other than nasopharyngeal swab, presence of viral mutation(s) within the areas targeted by this assay, and inadequate number of viral copies(<138 copies/mL). A negative result must be combined with clinical observations, patient history, and epidemiological information. The expected result is Negative.  Fact Sheet for Patients:  EntrepreneurPulse.com.au  Fact Sheet for Healthcare Providers:  IncredibleEmployment.be  This test is no t yet approved or cleared by the [place] FDA and  has been authorized for detection and/or diagnosis of SARS-CoV-2 by FDA under an Emergency Use Authorization (EUA). This EUA will remain  in effect (meaning this test can be used) for the duration of the COVID-19 declaration under Section 564(b)(1) of the Act, 21 U.S.C.section 360bbb-3(b)(1), unless the authorization is terminated  or revoked sooner.       Influenza A by PCR NEGATIVE NEGATIVE Final   Influenza B by PCR NEGATIVE NEGATIVE Final    Comment: (NOTE) The Xpert Xpress SARS-CoV-2/FLU/RSV plus assay is intended as an aid in the diagnosis of influenza from Nasopharyngeal swab specimens and should not be used as a sole  basis for treatment. Nasal washings and aspirates are unacceptable for Xpert Xpress SARS-CoV-2/FLU/RSV testing.  Fact Sheet for Patients: EntrepreneurPulse.com.au  Fact Sheet for Healthcare Providers: IncredibleEmployment.be  This test is not yet approved or cleared by the [place] FDA and has been authorized for detection and/or diagnosis of SARS-CoV-2 by FDA under an Emergency Use Authorization (EUA). This EUA will remain in effect (meaning this test can be used) for the duration of the COVID-19 declaration under Section 564(b)(1) of the Act, 21 U.S.C. section 360bbb-3(b)(1), unless the authorization is terminated or revoked.  Performed at [facility], [address] [place], [place] [address]       Studies: DG Chest 2 View  Result Date: 01/29/2021 CLINICAL DATA:  Shortness of breath, syncope EXAM: CHEST - 2 VIEW COMPARISON:  11/11/2015 FINDINGS: Trace bilateral pleural effusions. Bibasilar atelectasis, right greater than left. Heart is normal size. No acute bony abnormality. IMPRESSION: Bibasilar atelectasis and trace bilateral effusions. Electronically Signed   By: Lennette Bihari  [place] M.D.   On: 01/29/2021 17:27   US Venous Img Lower Bilateral  Result Date: 01/29/2021 CLINICAL DATA:  Bilateral leg swelling. Possible pulmonary embolus but unable to obtain chest CT angio due to renal function. Assess for DVT. EXAM: BILATERAL LOWER EXTREMITY VENOUS DOPPLER ULTRASOUND TECHNIQUE: Gray-scale sonography with compression, as well as color and duplex ultrasound, were performed to evaluate the deep venous system(s) from the level of the common femoral vein through the popliteal and proximal calf veins. COMPARISON:  None. FINDINGS: VENOUS Normal compressibility of the common femoral, superficial femoral, and popliteal veins, as well as the visualized calf veins. Visualized portions of profunda femoral vein and great saphenous vein unremarkable.  No filling defects to suggest DVT on grayscale or color Doppler imaging. Doppler waveforms show normal direction of venous flow, normal respiratory plasticity and response to augmentation. OTHER None. Limitations: none IMPRESSION: No evidence of bilateral lower extremity DVT. Electronically Signed   By: Keith Rake M.D.   On: 01/29/2021 22:52   ECHOCARDIOGRAM COMPLETE  Result Date: 01/30/2021    ECHOCARDIOGRAM REPORT   Patient Name:   Gary A Phil Dopp. Date of Exam: 01/30/2021 Medical Rec #:  [id number]             Height:       73.0 in Accession #:    [id number]            Weight:       197.0 lb Date of Birth:  Aug 14, 1950             BSA:          2.138 m Patient Age:    89 years              BP:           157/87 mmHg Patient Gender: M                     HR:           87 bpm. Exam Location:  [facility] Procedure: 2D Echo, Color Doppler and Cardiac Doppler Indications:     I50.21 congestive heart failure-Acute Systolic  History:         Patient has prior history of Echocardiogram examinations. PAD                  and Stroke, Signs/Symptoms:Shortness of Breath and Edema; Risk                  Factors:Dyslipidemia.  Sonographer:     Charmayne Sheer Referring Phys:  [id number] Gretta Cool PATEL Diagnosing Phys: Yolonda Kida MD  Sonographer Comments: Suboptimal parasternal window. IMPRESSIONS  1. Left ventricular ejection fraction, by estimation, is 25 to 30%. The left ventricle has severely decreased function. The left ventricle demonstrates global hypokinesis. The left ventricular internal cavity size was mildly dilated. Left ventricular diastolic parameters are consistent with Grade II diastolic dysfunction (pseudonormalization).  2. Right ventricular systolic function is normal. The right ventricular size is normal.  3. Left atrial size was mildly dilated.  4. The mitral valve is normal in structure. Trivial mitral valve regurgitation.  5. The aortic valve is grossly normal. Aortic valve regurgitation is not  visualized. FINDINGS  Left Ventricle: Left ventricular ejection fraction, by estimation, is 25 to 30%. The left ventricle has severely decreased function. The left ventricle demonstrates global hypokinesis. The left ventricular internal cavity size was mildly dilated. There is no left ventricular hypertrophy. Left ventricular diastolic  parameters are consistent with Grade II diastolic dysfunction (pseudonormalization). Right Ventricle: The right ventricular size is normal. No increase in right ventricular wall thickness. Right ventricular systolic function is normal. Left Atrium: Left atrial size was mildly dilated. Right Atrium: Right atrial size was normal in size. Pericardium: There is no evidence of pericardial effusion. Mitral Valve: The mitral valve is normal in structure. Trivial mitral valve regurgitation. MV peak gradient, 3.0 mmHg. The mean mitral valve gradient is 2.0 mmHg. Tricuspid Valve: The tricuspid valve is normal in structure. Tricuspid valve regurgitation is trivial. Aortic Valve: The aortic valve is grossly normal. Aortic valve regurgitation is not visualized. Aortic valve mean gradient measures 3.0 mmHg. Aortic valve peak gradient measures 4.0 mmHg. Aortic valve area, by VTI measures 2.52 cm. Pulmonic Valve: The pulmonic valve was normal in structure. Pulmonic valve regurgitation is not visualized. Aorta: The ascending aorta was not well visualized. IAS/Shunts: No atrial level shunt detected by color flow Doppler. Additional Comments: There is no pleural effusion.  LEFT VENTRICLE PLAX 2D LVIDd:         5.00 cm      Diastology LVIDs:         4.30 cm      LV e' medial:    4.68 cm/s LV PW:         1.20 cm      LV E/e' medial:  19.9 LV IVS:        1.00 cm      LV e' lateral:   7.40 cm/s LVOT diam:     2.10 cm      LV E/e' lateral: 12.6 LV SV:         59 LV SV Index:   28 LVOT Area:     3.46 cm  LV Volumes (MOD) LV vol d, MOD A2C: 175.0 ml LV vol d, MOD A4C: 120.0 ml LV vol s, MOD A2C: 126.0 ml LV  vol s, MOD A4C: 91.3 ml LV SV MOD A2C:     49.0 ml LV SV MOD A4C:     120.0 ml LV SV MOD BP:      35.8 ml RIGHT VENTRICLE RV Basal diam:  2.90 cm LEFT ATRIUM             Index        RIGHT ATRIUM           Index LA diam:        4.10 cm 1.92 cm/m   RA Area:     12.60 cm LA Vol (A2C):   52.5 ml 24.56 ml/m  RA Volume:   29.40 ml  13.75 ml/m LA Vol (A4C):   64.0 ml 29.94 ml/m LA Biplane Vol: 58.3 ml 27.27 ml/m  AORTIC VALVE                    PULMONIC VALVE AV Area (Vmax):    3.14 cm     PV Vmax:       0.94 m/s AV Area (Vmean):   2.66 cm     PV Vmean:      69.500 cm/s AV Area (VTI):     2.52 cm     PV VTI:        0.183 m AV Vmax:           100.00 cm/s  PV Peak grad:  3.5 mmHg AV Vmean:          79.500 cm/s  PV Mean grad:  2.0 mmHg  AV VTI:            0.235 m AV Peak Grad:      4.0 mmHg AV Mean Grad:      3.0 mmHg LVOT Vmax:         90.70 cm/s LVOT Vmean:        61.100 cm/s LVOT VTI:          0.171 m LVOT/AV VTI ratio: 0.73  AORTA Ao Root diam: 3.20 cm MITRAL VALVE MV Area (PHT): 6.83 cm    SHUNTS MV Area VTI:   2.93 cm    Systemic VTI:  0.17 m MV Peak grad:  3.0 mmHg    Systemic Diam: 2.10 cm MV Mean grad:  2.0 mmHg MV Vmax:       0.87 m/s MV Vmean:      72.3 cm/s MV Decel Time: 111 msec MV E velocity: 93.00 cm/s MV A velocity: 88.30 cm/s MV E/A ratio:  1.05 Dwayne D [place] MD Electronically signed by Yolonda Kida MD Signature Date/Time: 01/30/2021/2:12:21 PM    Final     Scheduled Meds:  aspirin EC  81 mg Oral Daily   methylPREDNISolone (SOLU-MEDROL) injection  40 mg Intravenous Q12H   nicotine  14 mg Transdermal Daily   pantoprazole (PROTONIX) IV  40 mg Intravenous Q24H   rosuvastatin  20 mg Oral Daily   sodium chloride flush  3 mL Intravenous Q12H    Continuous Infusions:  heparin 1,000 Units/hr (01/30/21 0747)     LOS: 1 day     Kayleen Memos, MD [facility] Pager [phone]  If 7PM-7AM, please contact night-coverage [email or web address] Password [email or web address] 01/30/2021, 3:07 PM

## 2021-01-30 NOTE — ED Notes (Signed)
This RN began giving report to ICU RN Alroy Dust on this pt, ICU charge RN stopped this RN giving report, got on phone, and stated they were not taking this pt, that they had two pt's in line before this one so they were not accepting report at this time. ED Charge RN notified at this time.

## 2021-01-30 NOTE — ED Notes (Signed)
This RN confirmed with lab at this time that they would be coming to draw this pt's hemoglobin, hematocrit, and APTT.

## 2021-01-31 ENCOUNTER — Encounter: Payer: Self-pay | Admitting: Internal Medicine

## 2021-01-31 DIAGNOSIS — I5021 Acute systolic (congestive) heart failure: Secondary | ICD-10-CM | POA: Diagnosis present

## 2021-01-31 DIAGNOSIS — I5023 Acute on chronic systolic (congestive) heart failure: Secondary | ICD-10-CM | POA: Diagnosis present

## 2021-01-31 DIAGNOSIS — I5033 Acute on chronic diastolic (congestive) heart failure: Secondary | ICD-10-CM | POA: Diagnosis present

## 2021-01-31 DIAGNOSIS — R0602 Shortness of breath: Secondary | ICD-10-CM | POA: Diagnosis not present

## 2021-01-31 LAB — PROTEIN / CREATININE RATIO, URINE
Creatinine, Urine: 81 mg/dL
Protein Creatinine Ratio: 0.12 mg/mg{Cre} (ref 0.00–0.15)
Total Protein, Urine: 10 mg/dL

## 2021-01-31 LAB — COMPREHENSIVE METABOLIC PANEL
ALT: 24 U/L (ref 0–44)
AST: 39 U/L (ref 15–41)
Albumin: 4 g/dL (ref 3.5–5.0)
Alkaline Phosphatase: 69 U/L (ref 38–126)
Anion gap: 10 (ref 5–15)
BUN: 47 mg/dL — ABNORMAL HIGH (ref 8–23)
CO2: 22 mmol/L (ref 22–32)
Calcium: 8.9 mg/dL (ref 8.9–10.3)
Chloride: 107 mmol/L (ref 98–111)
Creatinine, Ser: 2.46 mg/dL — ABNORMAL HIGH (ref 0.61–1.24)
GFR, Estimated: 28 mL/min — ABNORMAL LOW (ref >60)
Glucose, Bld: 128 mg/dL — ABNORMAL HIGH (ref 70–99)
Potassium: 4.6 mmol/L (ref 3.5–5.1)
Sodium: 139 mmol/L (ref 135–145)
Total Bilirubin: 1 mg/dL (ref 0.3–1.2)
Total Protein: 7.2 g/dL (ref 6.5–8.1)

## 2021-01-31 LAB — CBC
HCT: 30.7 % — ABNORMAL LOW (ref 39.0–52.0)
Hemoglobin: 10.2 g/dL — ABNORMAL LOW (ref 13.0–17.0)
MCH: 29.6 pg (ref 26.0–34.0)
MCHC: 33.2 g/dL (ref 30.0–36.0)
MCV: 89 fL (ref 80.0–100.0)
Platelets: 222 10*3/uL (ref 150–400)
RBC: 3.45 MIL/uL — ABNORMAL LOW (ref 4.22–5.81)
RDW: 13.8 % (ref 11.5–15.5)
WBC: 15.1 10*3/uL — ABNORMAL HIGH (ref 4.0–10.5)
nRBC: 0 % (ref 0.0–0.2)

## 2021-01-31 LAB — LIPID PANEL
Cholesterol: 129 mg/dL (ref 0–200)
HDL: 38 mg/dL — ABNORMAL LOW (ref >40)
LDL Cholesterol: 73 mg/dL (ref 0–99)
Total CHOL/HDL Ratio: 3.4 RATIO
Triglycerides: 91 mg/dL (ref <150)
VLDL: 18 mg/dL (ref 0–40)

## 2021-01-31 LAB — APTT: aPTT: 79 seconds — ABNORMAL HIGH (ref 24–36)

## 2021-01-31 LAB — TSH: TSH: 0.23 u[IU]/mL — ABNORMAL LOW (ref 0.350–4.500)

## 2021-01-31 LAB — PHOSPHORUS: Phosphorus: 4.4 mg/dL (ref 2.5–4.6)

## 2021-01-31 LAB — HEPARIN LEVEL (UNFRACTIONATED): Heparin Unfractionated: 0.53 IU/mL (ref 0.30–0.70)

## 2021-01-31 LAB — T4, FREE: Free T4: 0.86 ng/dL (ref 0.61–1.12)

## 2021-01-31 LAB — MAGNESIUM: Magnesium: 2.1 mg/dL (ref 1.7–2.4)

## 2021-01-31 MED ORDER — DM-GUAIFENESIN ER 30-600 MG PO TB12
1.0000 | ORAL_TABLET | Freq: Two times a day (BID) | ORAL | Status: DC
  Administered 2021-01-31 – 2021-02-06 (×12): 1 via ORAL
  Filled 2021-01-31 (×12): qty 1

## 2021-01-31 MED ORDER — ALBUTEROL SULFATE (2.5 MG/3ML) 0.083% IN NEBU: 3.0000 mL | INHALATION_SOLUTION | RESPIRATORY_TRACT | Status: DC | PRN

## 2021-01-31 MED ORDER — TORSEMIDE 20 MG PO TABS
20.0000 mg | ORAL_TABLET | Freq: Every day | ORAL | Status: DC
  Administered 2021-02-01 – 2021-02-03 (×3): 20 mg via ORAL
  Filled 2021-01-31 (×3): qty 1

## 2021-01-31 NOTE — Consult Note (Signed)
[facility]  CONSULT NOTE    Date: 01/31/2021                  Patient Name:  Gary A Phil Dopp.  MRN: [id number]  DOB: 07-25-1950  Age / Sex: 70 y.o., male         PCP: Rusty Aus, MD                 Service Requesting Consult: Dr. Arbutus Ped                 Reason for Consult: Acute kidney injury            History of Present Illness: Gary Mccoy. Admitted to [facility] for progressive shortness of breath, cough and weakness. Patient was using celebrex until his most recent appointment with his PCP. At that time, he was found to have a creatinine above his baseline at 2.7. Patient was also asked to stop taking olmesartan and hydrochlorothiazide.   Patient had gotten PO antibiotics for his shortness of breath with limited improvement. He continues to smoke.   Nephrology consulted for acute kidney injury. Patient found to have systolic and diastolic congestive heart failure on admission. Placed on IV furosemide with good urine output.    Medications: Outpatient medications: Medications Prior to Admission  Medication Sig Dispense Refill Last Dose   amLODipine (NORVASC) 5 MG tablet Take 5 mg by mouth in the morning and at bedtime.   01/29/2021   aspirin EC 81 MG tablet Take 1 oz by mouth daily.   01/29/2021   Coenzyme Q10 (COQ10) 100 MG CAPS Take by mouth daily.   01/28/2021   Multiple Vitamins-Minerals (MULTIVITAMIN ADULT PO) Take 1 tablet by mouth daily.    01/29/2021   Omega-3 Fatty Acids (FISH OIL PO) Take by mouth.   01/29/2021   pantoprazole (PROTONIX) 40 MG tablet Take 1 tablet by mouth daily.   01/29/2021   rosuvastatin (CRESTOR) 40 MG tablet Take 20 mg by mouth daily.   01/28/2021   vitamin B-12 (CYANOCOBALAMIN) 1000 MCG tablet Take 1,000 mcg by mouth daily.   01/29/2021   XARELTO 20 MG TABS tablet Take 20 mg by mouth daily.   01/29/2021   celecoxib (CELEBREX) 200 MG capsule Take 200 mg by mouth daily. (Patient not taking: Reported on  01/29/2021)  11 Not Taking   cephALEXin (KEFLEX) 500 MG capsule Take 1 capsule (500 mg total) by mouth 2 (two) times daily. (Patient not taking: No sig reported) 14 capsule 0 Not Taking   nicotine (NICODERM CQ - DOSED IN MG/24 HOURS) 21 mg/24hr patch Place onto the skin.      olmesartan-hydrochlorothiazide (BENICAR HCT) 20-12.5 MG tablet Take by mouth.      predniSONE (DELTASONE) 10 MG tablet Start with 3 pills tomorrow. Taper over the next 6 days.  3,3,2,2,1,1. (Patient not taking: No sig reported) 12 tablet 0 Not Taking    Current medications: Current Facility-Administered Medications  Medication Dose Route Frequency Provider Last Rate Last Admin   acetaminophen (TYLENOL) tablet 650 mg  650 mg Oral Q6H PRN Para Skeans, MD       Or   acetaminophen (TYLENOL) suppository 650 mg  650 mg Rectal Q6H PRN Para Skeans, MD       albuterol (PROVENTIL) (2.5 MG/3ML) 0.083% nebulizer solution 3 mL  3 mL Inhalation Q4H PRN Nicole Kindred A, DO       aspirin EC tablet 81 mg  81 mg Oral Daily Para Skeans, MD   81 mg at 01/31/21 1059   dextromethorphan-guaiFENesin ([place] DM) 30-600 MG per 12 hr tablet 1 tablet  1 tablet Oral BID Nicole Kindred A, DO       heparin ADULT infusion 100 units/mL (25000 units/272m)  1,000 Units/hr Intravenous Continuous BRenda Rolls RPH 10 mL/hr at 01/31/21 0951 1,000 Units/hr at 01/31/21 05997  hydrALAZINE (APRESOLINE) injection 5 mg  5 mg Intravenous Q4H PRN PPara Skeans MD       methylPREDNISolone sodium succinate (SOLU-MEDROL) 40 mg/mL injection 40 mg  40 mg Intravenous Q12H PPara Skeans MD   40 mg at 01/31/21 0528   metoprolol tartrate (LOPRESSOR) tablet 12.5 mg  12.5 mg Oral BID HIrene PapN, DO   12.5 mg at 01/31/21 1059   morphine 2 MG/ML injection 2 mg  2 mg Intravenous Q2H PRN PPara Skeans MD       nicotine (NICODERM CQ - dosed in mg/24 hours) patch 14 mg  14 mg Transdermal Daily PPara Skeans MD   14 mg at 01/31/21 1059   ondansetron (ZOFRAN)  tablet 4 mg  4 mg Oral Q6H PRN PPara Skeans MD       Or   ondansetron ([facility] injection 4 mg  4 mg Intravenous Q6H PRN PPara Skeans MD       pantoprazole (PROTONIX) injection 40 mg  40 mg Intravenous Q24H PPara Skeans MD   40 mg at 01/31/21 0249   rosuvastatin (CRESTOR) tablet 20 mg  20 mg Oral Daily PPara Skeans MD   20 mg at 01/31/21 1059   sodium chloride flush (NS) 0.9 % injection 3 mL  3 mL Intravenous Q12H PPara Skeans MD   3 mL at 01/31/21 1059      Allergies: No Known Allergies    Past Medical History: Past Medical History:  Diagnosis Date   Arthritis    lower back   Back pain    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    Complication of anesthesia    Violent with hallucinations after procedure 10/2015   DDD (degenerative disc disease), lumbar    Duodenal ulcer    Elevated lipids    GERD (gastroesophageal reflux disease)    Leg weakness, bilateral    s/p lumbar surgery   [place] ([place]ipheral artery disease) [place]    Polyradiculitis    Stroke ([place]    Rt lower leg  estimated 10 - 12 stokes in last 5 years   Wears dentures    full upper and lower     Past Surgical History: Past Surgical History:  Procedure Laterality Date   AORTA - BILATERAL FEMORAL ARTERY BYPASS GRAFT N/A 11/08/2015   Procedure: AORTA BIFEMORAL BYPASS GRAFT;  Surgeon: GKatha Cabal MD;  Location: [facility] ORS;  Service: Vascular;  Laterality: N/A;   BACK SURGERY  2005   [place]ONOS[place] WITH PROP[place]OL N/A 12/30/2017   Procedure: COLONOSCOPY WITH PROPOFOL;  Surgeon: Tol[place], TBenay Pike MD;  Location: [facility];  Service: Gastroenterology;  Laterality: N/A;   ENDARTERECTOMY FEMORAL Bilateral 11/08/2015   Procedure: ENDARTERECTOMY FEMORAL;  Surgeon: GKatha Cabal MD;  Location: [facility] OR[facility];  Service: Vascular;  Laterality: Bilateral;  common, SFA, and profundis  Aortic endarterectomy    ESOPHAGOGASTRODUODENOSCOPY (EGD) WITH PROPOFOL N/A 09/04/2015   Procedure:  ESOPHAGOGASTRODUODENOSCOPY (EGD) WITH PROPOFOL;  Surgeon: DLucilla Lame MD;  [place]  Service: Endoscopy;  Laterality: N/A;   LUMBAR LAMINECTOMY  2006   SEPTOPLASTY N/A 09/28/2020   Procedure: SEPTOPLASTY;  Surgeon: Margaretha Sheffield, MD;  Location: [place];  Service: ENT;  Laterality: N/A;   TEE WITHOUT CARDIOVERSION N/A 09/22/2015   Procedure: TRANSESOPHAGEAL ECHOCARDIOGRAM (TEE);  Surgeon: Teodoro Spray, MD;  Location: [facility];  Service: Cardiovascular;  Laterality: N/A;   TURBINATE REDUCTION Bilateral 09/28/2020   Procedure: TURBINATE REDUCTION;  Surgeon: Margaretha Sheffield, MD;  Location: [place];  Service: ENT;  Laterality: Bilateral;     Family History: Family History  Problem Relation Age of Onset   Heart attack Mother    Hypertension Mother    Varicose Veins Mother    Diabetes Father    Heart attack Father    Heart attack Maternal Grandmother    Stroke Maternal Grandmother      Social History: Social History   Socioeconomic History   Marital status: Married    Spouse name: Not on file   Number of children: Not on file   Years of education: Not on file   Highest education level: Not on file  Occupational History   Not on file  Tobacco Use   Smoking status: Every Day    Packs/day: 1.00    Years: 45.00    Pack years: 45.00    Types: Cigarettes   Smokeless tobacco: Never   Tobacco comments:    09/21/20- currently 0.5 PPD  Vaping Use   Vaping Use: Never used  Substance and Sexual Activity   Alcohol use: Yes    Comment: rare 3 a year   Drug use: No   Sexual activity: Not on file  Other Topics Concern   Not on file  Social History Narrative   Not on file   Social Determinants of Health   Financial Resource Strain: Not on file  Food Insecurity: Not on file  Transportation Needs: Not on file  Physical Activity: Not on file  Stress: Not on file  Social Connections: Not on file  Intimate Partner Violence: Not on file      Review of Systems: Review of Systems  Constitutional: Negative.   HENT: Negative.    Eyes: Negative.   Respiratory:  Positive for cough, sputum production, shortness of breath and wheezing. Negative for hemoptysis.   Cardiovascular:  Positive for chest pain and leg swelling. Negative for palpitations, orthopnea, claudication and PND.  Gastrointestinal: Negative.   Genitourinary:  Negative for dysuria, flank pain, frequency, hematuria and urgency.  Musculoskeletal:  Negative for back pain, falls, joint pain, myalgias and neck pain.  Skin: Negative.   Neurological: Negative.   Endo/Heme/Allergies: Negative.   Psychiatric/Behavioral: Negative.     Vital Signs: Blood pressure (!) 155/84, pulse 72, temperature (!) 97.5 F (36.4 C), resp. rate 18, height 6' (1.829 m), weight 86.1 kg, SpO2 100 %.  Weight trends: Filed Weights   01/30/21 1455 01/31/21 1007  Weight: 89.4 kg 86.1 kg    Physical Exam: General: NAD, sitting up in bed.   Head: Normocephalic, atraumatic. Moist oral mucosal membranes  Eyes: Anicteric, PERRL  Neck: Supple, trachea midline  Lungs:  Expiratory wheezing, 4 L [place] O2  Heart: Regular rate and rhythm  Abdomen:  Soft, nontender,   Extremities: No peripheral edema.  Neurologic: Nonfocal, moving all four extremities  Skin: No lesions         Lab results: Basic Metabolic Panel: Recent Labs  Lab 01/29/21 1700 01/30/21 0410 01/31/21 0434  NA 139 138 139  K 4.2 4.6 4.6  CL 108 109 107  CO2 21* 20* 22  GLUCOSE 104* 166* 128*  BUN 30* 34* 47*  CREATININE 2.23* 2.36* 2.46*  CALCIUM 9.1 8.8* 8.9  MG 2.0  --  2.1  PHOS  --   --  4.4    Liver Function Tests: Recent Labs  Lab 01/29/21 1700 01/31/21 0434  AST 29 39  ALT 25 24  ALKPHOS 85 69  BILITOT 1.7* 1.0  PROT 7.9 7.2  ALBUMIN 4.6 4.0   No results for input(s): LIPASE, AMYLASE in the last 168 hours. No results for input(s): AMMONIA in the last 168 hours.  CBC: Recent Labs  Lab  01/29/21 1700 01/30/21 0410 01/30/21 0744 01/30/21 1459 01/31/21 0434  WBC 8.9 4.1  --   --  15.1*  NEUTROABS 7.3  --   --   --   --   HGB 10.9* 9.9* 10.0* 9.6* 10.2*  HCT 33.1* 30.3* 29.8* 28.6* 30.7*  MCV 88.3 89.4  --   --  89.0  PLT 218 188  --   --  222    Cardiac Enzymes: No results for input(s): CKTOTAL, CKMB, CKMBINDEX, TROPONINI in the last 168 hours.  BNP: Invalid input(s): POCBNP  CBG: No results for input(s): GLUCAP in the last 168 hours.  Microbiology: Results for orders placed or performed during the hospital encounter of 01/29/21  Resp Panel by RT-PCR (Flu A&B, Covid) Nasopharyngeal Swab     Status: None   Collection Time: 01/29/21  9:17 PM   Specimen: Nasopharyngeal Swab; Nasopharyngeal(NP) swabs in vial transport medium  Result Value Ref Range Status   SARS Coronavirus 2 by RT PCR NEGATIVE NEGATIVE Final    Comment: (NOTE) SARS-CoV-2 target nucleic acids are NOT DETECTED.  The SARS-CoV-2 RNA is generally detectable in upper respiratory specimens during the acute phase of infection. The lowest concentration of SARS-CoV-2 viral copies this assay can detect is 138 copies/mL. A negative result does not preclude SARS-Cov-2 infection and should not be used as the sole basis for treatment or other patient management decisions. A negative result may occur with  improper specimen collection/handling, submission of specimen other than nasopharyngeal swab, presence of viral mutation(s) within the areas targeted by this assay, and inadequate number of viral copies(<138 copies/mL). A negative result must be combined with clinical observations, patient history, and epidemiological information. The expected result is Negative.  Fact Sheet for Patients:  EntrepreneurPulse.com.au  Fact Sheet for Healthcare Providers:  IncredibleEmployment.be  This test is no t yet approved or cleared by the [place] FDA and  has been  authorized for detection and/or diagnosis of SARS-CoV-2 by FDA under an Emergency Use Authorization (EUA). This EUA will remain  in effect (meaning this test can be used) for the duration of the COVID-19 declaration under Section 564(b)(1) of the Act, 21 U.S.C.section 360bbb-3(b)(1), unless the authorization is terminated  or revoked sooner.       Influenza A by PCR NEGATIVE NEGATIVE Final   Influenza B by PCR NEGATIVE NEGATIVE Final    Comment: (NOTE) The Xpert Xpress SARS-CoV-2/FLU/RSV plus assay is intended as an aid in the diagnosis of influenza from Nasopharyngeal swab specimens and should not be used as a sole basis for treatment. Nasal washings and aspirates are unacceptable for Xpert Xpress SARS-CoV-2/FLU/RSV testing.  Fact Sheet for Patients: EntrepreneurPulse.com.au  Fact Sheet for Healthcare Providers: IncredibleEmployment.be  This test is not yet approved or cleared by the [place] and has been authorized  for detection and/or diagnosis of SARS-CoV-2 by FDA under an Emergency Use Authorization (EUA). This EUA will remain in effect (meaning this test can be used) for the duration of the COVID-19 declaration under Section 564(b)(1) of the Act, 21 U.S.C. section 360bbb-3(b)(1), unless the authorization is terminated or revoked.  Performed at [facility], [address] [place], [place] [address]     Coagulation Studies: Recent Labs    01/29/21 1700  LABPROT 21.5*  INR 1.9*    Urinalysis: Recent Labs    01/30/21 1540  COLORURINE STRAW*  LABSPEC 1.005  PHURINE 5.0  GLUCOSEU NEGATIVE  HGBUR SMALL*  BILIRUBINUR NEGATIVE  KETONESUR NEGATIVE  PROTEINUR NEGATIVE  NITRITE NEGATIVE  LEUKOCYTESUR NEGATIVE      Imaging: DG Chest 2 View  Result Date: 01/29/2021 CLINICAL DATA:  Shortness of breath, syncope EXAM: CHEST - 2 VIEW COMPARISON:  11/11/2015 FINDINGS: Trace bilateral pleural effusions.  Bibasilar atelectasis, right greater than left. Heart is normal size. No acute bony abnormality. IMPRESSION: Bibasilar atelectasis and trace bilateral effusions. Electronically Signed   By: Rolm Baptise M.D.   On: 01/29/2021 17:27   US Venous Img Lower Bilateral  Result Date: 01/29/2021 CLINICAL DATA:  Bilateral leg swelling. Possible pulmonary embolus but unable to obtain chest CT angio due to renal function. Assess for DVT. EXAM: BILATERAL LOWER EXTREMITY VENOUS DOPPLER ULTRASOUND TECHNIQUE: Gray-scale sonography with compression, as well as color and duplex ultrasound, were performed to evaluate the deep venous system(s) from the level of the common femoral vein through the popliteal and proximal calf veins. COMPARISON:  None. FINDINGS: VENOUS Normal compressibility of the common femoral, superficial femoral, and popliteal veins, as well as the visualized calf veins. Visualized portions of profunda femoral vein and great saphenous vein unremarkable. No filling defects to suggest DVT on grayscale or color Doppler imaging. Doppler waveforms show normal direction of venous flow, normal respiratory plasticity and response to augmentation. OTHER None. Limitations: none IMPRESSION: No evidence of bilateral lower extremity DVT. Electronically Signed   By: Keith Rake M.D.   On: 01/29/2021 22:52   ECHOCARDIOGRAM COMPLETE  Result Date: 01/30/2021    ECHOCARDIOGRAM REPORT   Patient Name:   Gary A Phil Dopp. Date of Exam: 01/30/2021 Medical Rec #:  [id number]             Height:       73.0 in Accession #:    [id number]            Weight:       197.0 lb Date of Birth:  08-25-1950             BSA:          2.138 m Patient Age:    22 years              BP:           157/87 mmHg Patient Gender: M                     HR:           87 bpm. Exam Location:  [facility] Procedure: 2D Echo, Color Doppler and Cardiac Doppler Indications:     I50.21 congestive heart failure-Acute Systolic  History:         Patient has prior  history of Echocardiogram examinations. PAD                  and Stroke, Signs/Symptoms:Shortness of Breath and Edema; Risk  Factors:Dyslipidemia.  Sonographer:     Charmayne Sheer Referring Phys:  [id number] Gretta Cool PATEL Diagnosing Phys: Yolonda Kida MD  Sonographer Comments: Suboptimal parasternal window. IMPRESSIONS  1. Left ventricular ejection fraction, by estimation, is 25 to 30%. The left ventricle has severely decreased function. The left ventricle demonstrates global hypokinesis. The left ventricular internal cavity size was mildly dilated. Left ventricular diastolic parameters are consistent with Grade II diastolic dysfunction (pseudonormalization).  2. Right ventricular systolic function is normal. The right ventricular size is normal.  3. Left atrial size was mildly dilated.  4. The mitral valve is normal in structure. Trivial mitral valve regurgitation.  5. The aortic valve is grossly normal. Aortic valve regurgitation is not visualized. FINDINGS  Left Ventricle: Left ventricular ejection fraction, by estimation, is 25 to 30%. The left ventricle has severely decreased function. The left ventricle demonstrates global hypokinesis. The left ventricular internal cavity size was mildly dilated. There is no left ventricular hypertrophy. Left ventricular diastolic parameters are consistent with Grade II diastolic dysfunction (pseudonormalization). Right Ventricle: The right ventricular size is normal. No increase in right ventricular wall thickness. Right ventricular systolic function is normal. Left Atrium: Left atrial size was mildly dilated. Right Atrium: Right atrial size was normal in size. Pericardium: There is no evidence of pericardial effusion. Mitral Valve: The mitral valve is normal in structure. Trivial mitral valve regurgitation. MV peak gradient, 3.0 mmHg. The mean mitral valve gradient is 2.0 mmHg. Tricuspid Valve: The tricuspid valve is normal in structure. Tricuspid valve  regurgitation is trivial. Aortic Valve: The aortic valve is grossly normal. Aortic valve regurgitation is not visualized. Aortic valve mean gradient measures 3.0 mmHg. Aortic valve peak gradient measures 4.0 mmHg. Aortic valve area, by VTI measures 2.52 cm. Pulmonic Valve: The pulmonic valve was normal in structure. Pulmonic valve regurgitation is not visualized. Aorta: The ascending aorta was not well visualized. IAS/Shunts: No atrial level shunt detected by color flow Doppler. Additional Comments: There is no pleural effusion.  LEFT VENTRICLE PLAX 2D LVIDd:         5.00 cm      Diastology LVIDs:         4.30 cm      LV e' medial:    4.68 cm/s LV PW:         1.20 cm      LV E/e' medial:  19.9 LV IVS:        1.00 cm      LV e' lateral:   7.40 cm/s LVOT diam:     2.10 cm      LV E/e' lateral: 12.6 LV SV:         59 LV SV Index:   28 LVOT Area:     3.46 cm  LV Volumes (MOD) LV vol d, MOD A2C: 175.0 ml LV vol d, MOD A4C: 120.0 ml LV vol s, MOD A2C: 126.0 ml LV vol s, MOD A4C: 91.3 ml LV SV MOD A2C:     49.0 ml LV SV MOD A4C:     120.0 ml LV SV MOD BP:      35.8 ml RIGHT VENTRICLE RV Basal diam:  2.90 cm LEFT ATRIUM             Index        RIGHT ATRIUM           Index LA diam:        4.10 cm 1.92 cm/m   RA Area:  12.60 cm LA Vol (A2C):   52.5 ml 24.56 ml/m  RA Volume:   29.40 ml  13.75 ml/m LA Vol (A4C):   64.0 ml 29.94 ml/m LA Biplane Vol: 58.3 ml 27.27 ml/m  AORTIC VALVE                    PULMONIC VALVE AV Area (Vmax):    3.14 cm     PV Vmax:       0.94 m/s AV Area (Vmean):   2.66 cm     PV Vmean:      69.500 cm/s AV Area (VTI):     2.52 cm     PV VTI:        0.183 m AV Vmax:           100.00 cm/s  PV Peak grad:  3.5 mmHg AV Vmean:          79.500 cm/s  PV Mean grad:  2.0 mmHg AV VTI:            0.235 m AV Peak Grad:      4.0 mmHg AV Mean Grad:      3.0 mmHg LVOT Vmax:         90.70 cm/s LVOT Vmean:        61.100 cm/s LVOT VTI:          0.171 m LVOT/AV VTI ratio: 0.73  AORTA Ao Root diam: 3.20 cm  MITRAL VALVE MV Area (PHT): 6.83 cm    SHUNTS MV Area VTI:   2.93 cm    Systemic VTI:  0.17 m MV Peak grad:  3.0 mmHg    Systemic Diam: 2.10 cm MV Mean grad:  2.0 mmHg MV Vmax:       0.87 m/s MV Vmean:      72.3 cm/s MV Decel Time: 111 msec MV E velocity: 93.00 cm/s MV A velocity: 88.30 cm/s MV E/A ratio:  1.05 Dwayne D Callwood MD Electronically signed by Yolonda Kida MD Signature Date/Time: 01/30/2021/2:12:21 PM    Final      Assessment & Plan: Mr. Gary Milam. is a 70 y.o. white male with COPD, hypertension, peripheral vascular disease, CVA, CO exposure, peptic ulcer disease, who was admitted to [facility] on 01/29/2021 for Respiratory distress [R06.03] Elevated troponin [R77.8] Acute respiratory failure with hypoxia (HCC) [A55.25] Acute systolic (congestive) heart failure (HCC) [I50.21] Acute on chronic congestive heart failure, unspecified heart failure type (HCC) [I50.9]  Acute kidney injury on chronic kidney disease stage IIIB: baseline creatinine of 1.8, GFR of 38. Chronic kidney disease seems to be secondary to NSAID (years of using meloxicam and celecoxib). No IV contrast exposure.  - check renal ultrasound - Check SPEP/UPEP, hepatitis status - Will eventually need to restart ARB in the future.   Acute exacerbation of new onset systolic and diastolic congestive heart failure - transition to PO torsemide 75m daily.   Hypertension: 155/84 - not at goal. Driven by COPD and systemic steroids.   Hematuria: patient has had hematuria on urinalysis in 2019. Does not look like he had this worked up.      LOS: 2 Gary Mccoy 11/16/20224:21 PM

## 2021-01-31 NOTE — ED Notes (Signed)
Morning assessment: pt reports rested on/off last night. Denies SOB or any discomfort breathing. No c/o pain.

## 2021-01-31 NOTE — Progress Notes (Addendum)
PROGRESS NOTE    Gary Mccoy.   IRS:[id number]  DOB: [birth year]  PCP: Rusty Aus, MD    DOA: 01/29/2021 LOS: 2    Brief Narrative / Hospital Course to Date:   70 y.o. male with past medical history significant of CVA, peripheral artery disease, placed on Xarelto by vascular surgery since 2017, chronic combined diastolic and systolic CHF, nonbleeding internal hemorrhoids, diverticulosis noted colonoscopy in 2019, gastritis, nonbleeding duodenal ulcer seen on EGD done in 2017, hyperlipidemia, essential hypertension, GERD who presented to [facility] ED on 01/29/21 with complaints of progressively worsening shortness of breath of 1 week duration, worse with exertion.  Also reported intermittent loose black stools for the past 6 weeks.  Denies use of NSAIDs.  Work-up in the ED revealed acute hypoxic respiratory failure secondary to acute CHF with concern for pulmonary edema.  Elevated troponin, was started on heparin drip and his home Xarelto was held.  Labs also revealed iron deficiency anemia.   Admitted to [facility] service.  Cardiology and GI consulted.  Assessment & Plan   Principal Problem:   Shortness of breath Active Problems:   Benign essential HTN   Duodenal ulcer disease   Tobacco abuse counseling   Peripheral vascular disease ([place])   Hyperlipidemia   CVA (cerebral vascular accident) ([place])   Acute respiratory failure with hypoxia (HCC)   Melena   Anemia   Acute systolic (congestive) heart failure (HCC)   Acute on chronic combined diastolic and systolic CHF - Presented with progressive dyspnea with minimal exertion, elevated BNP >1000, pulmonary edema seen on chest x-ray. Last Echo in 2019 revealed LVEF 45 to 50% with grade 1 diastolic dysfunction. Echo on 01/30/2021 showed LVEF 25 to 30%, left ventricle shows global hypokinesis, grade 2 diastolic dysfunction. --Cardiology consulted --Continue IV diuresis --Started on Lopressor 12.5 mg p.o. twice daily --Continue  aspirin and statin --Strict I/O's and daily weights --Monitor renal function and electrolytes closely with diuresis --Needs ischemic evaluation  Acute exacerbation of suspected COPD versus bronchitis-patient is a 50-year smoker and has poor aeration and diffuse expiratory wheezing on exam consistent with exacerbated obstructive pulmonary disease.  Noted to have a productive sounding cough. --Continue IV steroids --Scheduled Mucinex --As needed albuterol  -- Recommend formal PFTs as outpatient --Supplement oxygen as needed to maintain sats above 90%   Elevated troponin due to suspect demand ischemia -likely secondary to hypoxia from pulmonary edema.  Troponin on presentation 126, peaked at 145 and downtrending. No acute ischemic changes on EKG.  No active chest pain --Xarelto held on admission --On heparin drip --Telemetry --Cardiology following   AKI on CKD 3B - likely prerenal azotemia due to dehydration from poor p.o. intake Baseline creatinine appears to be 1.6.  Presented with creatinine 2.36. --Nephrology consulted given need for diuresis --Avoid nephrotoxic agents and hypotension --Monitor renal function closely with diuresis, daily BMPs --Monitor urine output   Suspected upper GI bleed / intermittent melena Iron deficiency anemia History of gastritis/nonbleeding duodenal ulcer seen on EGD in 2017. History of nonbleeding internal hemorrhoids/diverticulosis seen on colonoscopy done in 2019. Iron deficiency anemia likely secondary to chronic blood loss. Hemoglobin on admission 9.9 -- Trend hemoglobin, transfuse if less than 8.0 --Continue IV PPI --On heparin, monitor closely for recurrent bleeding --GI consulted, planning endoscopy once respiratory status is improved   Acute hypoxic respiratory failure -likely due to CHF and COPD exacerbation Chest x-ray on admission which shows increasing pulmonary vascularity, bilateral pleural effusions. Requiring 4 liters per minute  supplemental oxygen on admission. Not on oxygen supplementation at baseline. -- Management of CHF and COPD as above --Supplemental oxygen to maintain sats above 90%, wean as tolerated --Incentive spirometry   Sinus tachycardia -present on admission.  TSH mildly low at 0.230, normal free T4 at 0.86. --Treat underlying conditions as above --Telemetry --Metoprolol started on admission   Hyperlipidemia -on Crestor Fasting lipid panel: Cholesterol 129, HDL low 38, LDL 73, TGs 91    Patient BMI: Body mass index is 25.76 kg/m.   DVT prophylaxis:    Diet:  Diet Orders (From admission, onward)     Start     Ordered   01/31/21 1214  Diet Heart Room service appropriate? Yes; Fluid consistency: Thin  Diet effective now       Question Answer Comment  Room service appropriate? Yes   Fluid consistency: Thin      01/31/21 1213              Code Status: DNR   Subjective 01/31/21    Patient seen with wife at bedside this morning.  He denies any prior history of heart failure or diagnosis of COPD but admits to having a 50-year smoking history.  Reports at last visit with PCP he was started on iron supplement and then became constipated so has not had further melena recently.  Has productive sounding cough but denies fevers or chills.  No other acute complaints at this time.   Disposition Plan & Communication   Status is: Inpatient  Remains inpatient appropriate because: On IV therapies as above and pending GI evaluation once respiratory status is improved    Family Communication: Wife at bedside on rounds today at 11/16   Consults, Procedures, Significant Events   Consultants:  Cardiology Gastroenterology  Procedures:  None  Antimicrobials:  Anti-infectives (From admission, onward)    None         Micro    Objective   Vitals:   01/31/21 0954 01/31/21 1007 01/31/21 1058 01/31/21 1141  BP: (!) 158/82  (!) 153/82 (!) 155/84  Pulse: 87   72  Resp: _0 Temp: 98.4 F (36.9 C)   (!) 97.5 F (36.4 C)  TempSrc:      SpO2: 100%   100%  Weight:  86.1 kg    Height:  6' (1.829 m)      Intake/Output Summary (Last 24 hours) at 01/31/2021 1529 Last data filed at 01/31/2021 1141 Gross per 24 hour  Intake 360.92 ml  Output 2500 ml  Net -2139.08 ml   Filed Weights   01/30/21 1455 01/31/21 1007  Weight: 89.4 kg 86.1 kg    Physical Exam:  General exam: awake, alert, no acute distress HEENT: atraumatic, clear conjunctiva, anicteric sclera, moist mucus membranes, hearing grossly normal  Respiratory system: Poor air movement with diffuse expiratory wheezes, normal respiratory effort, on 4 L/min nasal cannula oxygen. Cardiovascular system: normal S1/S2, RRR, no JVD, trace bilateral lower extremity edema.   Gastrointestinal system: soft, NT, ND, no HSM felt, +bowel sounds. Central nervous system: A&O x4. no gross focal neurologic deficits, normal speech Extremities: moves all, no cyanosis, normal tone Skin: dry, intact, normal temperature Psychiatry: normal mood, congruent affect, judgement and insight appear normal  Labs   Data Reviewed: I have personally reviewed following labs and imaging studies  CBC: Recent Labs  Lab 01/29/21 1700 01/30/21 0410 01/30/21 0744 01/30/21 1459 01/31/21 0434  WBC 8.9 4.1  --   --  15.1*  NEUTROABS 7.3  --   --   --   --   HGB 10.9* 9.9* 10.0* 9.6* 10.2*  HCT 33.1* 30.3* 29.8* 28.6* 30.7*  MCV 88.3 89.4  --   --  89.0  PLT 218 188  --   --  195   Basic Metabolic Panel: Recent Labs  Lab 01/29/21 1700 01/30/21 0410 01/31/21 0434  NA 139 138 139  K 4.2 4.6 4.6  CL 108 109 107  CO2 21* 20* 22  GLUCOSE 104* 166* 128*  BUN 30* 34* 47*  CREATININE 2.23* 2.36* 2.46*  CALCIUM 9.1 8.8* 8.9  MG 2.0  --  2.1  PHOS  --   --  4.4   GFR: Estimated Creatinine Clearance: 31.1 mL/min (A) (by C-G formula based on SCr of 2.46 mg/dL (H)). Liver Function Tests: Recent Labs  Lab 01/29/21 1700  01/31/21 0434  AST 29 39  ALT 25 24  ALKPHOS 85 69  BILITOT 1.7* 1.0  PROT 7.9 7.2  ALBUMIN 4.6 4.0   No results for input(s): LIPASE, AMYLASE in the last 168 hours. No results for input(s): AMMONIA in the last 168 hours. Coagulation Profile: Recent Labs  Lab 01/29/21 1700  INR 1.9*   Cardiac Enzymes: No results for input(s): CKTOTAL, CKMB, CKMBINDEX, TROPONINI in the last 168 hours. BNP (last 3 results) No results for input(s): PROBNP in the last 8760 hours. HbA1C: Recent Labs    01/29/21 2117  HGBA1C 5.2   CBG: No results for input(s): GLUCAP in the last 168 hours. Lipid Profile: Recent Labs    01/31/21 0434  CHOL 129  HDL 38*  LDLCALC 73  TRIG 91  CHOLHDL 3.4   Thyroid Function Tests: Recent Labs    01/31/21 0434  TSH 0.230*  FREET4 0.86   Anemia Panel: Recent Labs    01/30/21 0410  VITAMINB12 531  FOLATE 16.9  FERRITIN 193  TIBC 294  IRON 18*  RETICCTPCT 1.9   Sepsis Labs: No results for input(s): PROCALCITON, LATICACIDVEN in the last 168 hours.  Recent Results (from the past 240 hour(s))  Resp Panel by RT-PCR (Flu A&B, Covid) Nasopharyngeal Swab     Status: None   Collection Time: 01/29/21  9:17 PM   Specimen: Nasopharyngeal Swab; Nasopharyngeal(NP) swabs in vial transport medium  Result Value Ref Range Status   SARS Coronavirus 2 by RT PCR NEGATIVE NEGATIVE Final    Comment: (NOTE) SARS-CoV-2 target nucleic acids are NOT DETECTED.  The SARS-CoV-2 RNA is generally detectable in upper respiratory specimens during the acute phase of infection. The lowest concentration of SARS-CoV-2 viral copies this assay can detect is 138 copies/mL. A negative result does not preclude SARS-Cov-2 infection and should not be used as the sole basis for treatment or other patient management decisions. A negative result may occur with  improper specimen collection/handling, submission of specimen other than nasopharyngeal swab, presence of viral mutation(s)  within the areas targeted by this assay, and inadequate number of viral copies(<138 copies/mL). A negative result must be combined with clinical observations, patient history, and epidemiological information. The expected result is Negative.  Fact Sheet for Patients:  EntrepreneurPulse.com.au  Fact Sheet for Healthcare Providers:  IncredibleEmployment.be  This test is no t yet approved or cleared by the [place] FDA and  has been authorized for detection and/or diagnosis of SARS-CoV-2 by FDA under an Emergency Use Authorization (EUA). This EUA will remain  in effect (meaning this test can be used) for the duration of the  COVID-19 declaration under Section 564(b)(1) of the Act, 21 U.S.C.section 360bbb-3(b)(1), unless the authorization is terminated  or revoked sooner.       Influenza A by PCR NEGATIVE NEGATIVE Final   Influenza B by PCR NEGATIVE NEGATIVE Final    Comment: (NOTE) The Xpert Xpress SARS-CoV-2/FLU/RSV plus assay is intended as an aid in the diagnosis of influenza from Nasopharyngeal swab specimens and should not be used as a sole basis for treatment. Nasal washings and aspirates are unacceptable for Xpert Xpress SARS-CoV-2/FLU/RSV testing.  Fact Sheet for Patients: EntrepreneurPulse.com.au  Fact Sheet for Healthcare Providers: IncredibleEmployment.be  This test is not yet approved or cleared by the [place] FDA and has been authorized for detection and/or diagnosis of SARS-CoV-2 by FDA under an Emergency Use Authorization (EUA). This EUA will remain in effect (meaning this test can be used) for the duration of the COVID-19 declaration under Section 564(b)(1) of the Act, 21 U.S.C. section 360bbb-3(b)(1), unless the authorization is terminated or revoked.  Performed at [facility], [address], [place], [place] [address]       Imaging Studies   DG Chest 2  View  Result Date: 01/29/2021 CLINICAL DATA:  Shortness of breath, syncope EXAM: CHEST - 2 VIEW COMPARISON:  11/11/2015 FINDINGS: Trace bilateral pleural effusions. Bibasilar atelectasis, right greater than left. Heart is normal size. No acute bony abnormality. IMPRESSION: Bibasilar atelectasis and trace bilateral effusions. Electronically Signed   By: Rolm Baptise M.D.   On: 01/29/2021 17:27   US Venous Img Lower Bilateral  Result Date: 01/29/2021 CLINICAL DATA:  Bilateral leg swelling. Possible pulmonary embolus but unable to obtain chest CT angio due to renal function. Assess for DVT. EXAM: BILATERAL LOWER EXTREMITY VENOUS DOPPLER ULTRASOUND TECHNIQUE: Gray-scale sonography with compression, as well as color and duplex ultrasound, were performed to evaluate the deep venous system(s) from the level of the common femoral vein through the popliteal and proximal calf veins. COMPARISON:  None. FINDINGS: VENOUS Normal compressibility of the common femoral, superficial femoral, and popliteal veins, as well as the visualized calf veins. Visualized portions of profunda femoral vein and great saphenous vein unremarkable. No filling defects to suggest DVT on grayscale or color Doppler imaging. Doppler waveforms show normal direction of venous flow, normal respiratory plasticity and response to augmentation. OTHER None. Limitations: none IMPRESSION: No evidence of bilateral lower extremity DVT. Electronically Signed   By: Keith Rake M.D.   On: 01/29/2021 22:52   ECHOCARDIOGRAM COMPLETE  Result Date: 01/30/2021    ECHOCARDIOGRAM REPORT   Patient Name:   Gary Mccoy. Date of Exam: 01/30/2021 Medical Rec #:  [id number]             Height:       73.0 in Accession #:    [id number]            Weight:       197.0 lb Date of Birth:  Aug 17, 1950             BSA:          2.138 m Patient Age:    87 years              BP:           157/87 mmHg Patient Gender: M                     HR:           87 bpm. Exam  Location:  [facility] Procedure: 2D Echo, Color Doppler and Cardiac Doppler Indications:     I50.21 congestive heart failure-Acute Systolic  History:         Patient has prior history of Echocardiogram examinations. PAD                  and Stroke, Signs/Symptoms:Shortness of Breath and Edema; Risk                  Factors:Dyslipidemia.  Sonographer:     Charmayne Sheer Referring Phys:  [id number] Gretta Cool PATEL Diagnosing Phys: Yolonda Kida MD  Sonographer Comments: Suboptimal parasternal window. IMPRESSIONS  1. Left ventricular ejection fraction, by estimation, is 25 to 30%. The left ventricle has severely decreased function. The left ventricle demonstrates global hypokinesis. The left ventricular internal cavity size was mildly dilated. Left ventricular diastolic parameters are consistent with Grade II diastolic dysfunction (pseudonormalization).  2. Right ventricular systolic function is normal. The right ventricular size is normal.  3. Left atrial size was mildly dilated.  4. The mitral valve is normal in structure. Trivial mitral valve regurgitation.  5. The aortic valve is grossly normal. Aortic valve regurgitation is not visualized. FINDINGS  Left Ventricle: Left ventricular ejection fraction, by estimation, is 25 to 30%. The left ventricle has severely decreased function. The left ventricle demonstrates global hypokinesis. The left ventricular internal cavity size was mildly dilated. There is no left ventricular hypertrophy. Left ventricular diastolic parameters are consistent with Grade II diastolic dysfunction (pseudonormalization). Right Ventricle: The right ventricular size is normal. No increase in right ventricular wall thickness. Right ventricular systolic function is normal. Left Atrium: Left atrial size was mildly dilated. Right Atrium: Right atrial size was normal in size. Pericardium: There is no evidence of pericardial effusion. Mitral Valve: The mitral valve is normal in structure. Trivial mitral valve  regurgitation. MV peak gradient, 3.0 mmHg. The mean mitral valve gradient is 2.0 mmHg. Tricuspid Valve: The tricuspid valve is normal in structure. Tricuspid valve regurgitation is trivial. Aortic Valve: The aortic valve is grossly normal. Aortic valve regurgitation is not visualized. Aortic valve mean gradient measures 3.0 mmHg. Aortic valve peak gradient measures 4.0 mmHg. Aortic valve area, by VTI measures 2.52 cm. Pulmonic Valve: The pulmonic valve was normal in structure. Pulmonic valve regurgitation is not visualized. Aorta: The ascending aorta was not well visualized. IAS/Shunts: No atrial level shunt detected by color flow Doppler. Additional Comments: There is no pleural effusion.  LEFT VENTRICLE PLAX 2D LVIDd:         5.00 cm      Diastology LVIDs:         4.30 cm      LV e' medial:    4.68 cm/s LV PW:         1.20 cm      LV E/e' medial:  19.9 LV IVS:        1.00 cm      LV e' lateral:   7.40 cm/s LVOT diam:     2.10 cm      LV E/e' lateral: 12.6 LV SV:         59 LV SV Index:   28 LVOT Area:     3.46 cm  LV Volumes (MOD) LV vol d, MOD A2C: 175.0 ml LV vol d, MOD A4C: 120.0 ml LV vol s, MOD A2C: 126.0 ml LV vol s, MOD A4C: 91.3 ml LV SV MOD A2C:     49.0 ml LV SV MOD A4C:  120.0 ml LV SV MOD BP:      35.8 ml RIGHT VENTRICLE RV Basal diam:  2.90 cm LEFT ATRIUM             Index        RIGHT ATRIUM           Index LA diam:        4.10 cm 1.92 cm/m   RA Area:     12.60 cm LA Vol (A2C):   52.5 ml 24.56 ml/m  RA Volume:   29.40 ml  13.75 ml/m LA Vol (A4C):   64.0 ml 29.94 ml/m LA Biplane Vol: 58.3 ml 27.27 ml/m  AORTIC VALVE                    PULMONIC VALVE AV Area (Vmax):    3.14 cm     PV Vmax:       0.94 m/s AV Area (Vmean):   2.66 cm     PV Vmean:      69.500 cm/s AV Area (VTI):     2.52 cm     PV VTI:        0.183 m AV Vmax:           100.00 cm/s  PV Peak grad:  3.5 mmHg AV Vmean:          79.500 cm/s  PV Mean grad:  2.0 mmHg AV VTI:            0.235 m AV Peak Grad:      4.0 mmHg AV Mean  Grad:      3.0 mmHg LVOT Vmax:         90.70 cm/s LVOT Vmean:        61.100 cm/s LVOT VTI:          0.171 m LVOT/AV VTI ratio: 0.73  AORTA Ao Root diam: 3.20 cm MITRAL VALVE MV Area (PHT): 6.83 cm    SHUNTS MV Area VTI:   2.93 cm    Systemic VTI:  0.17 m MV Peak grad:  3.0 mmHg    Systemic Diam: 2.10 cm MV Mean grad:  2.0 mmHg MV Vmax:       0.87 m/s MV Vmean:      72.3 cm/s MV Decel Time: 111 msec MV E velocity: 93.00 cm/s MV A velocity: 88.30 cm/s MV E/A ratio:  1.05 Dwayne D Callwood MD Electronically signed by Yolonda Kida MD Signature Date/Time: 01/30/2021/2:12:21 PM    Final      Medications   Scheduled Meds:  aspirin EC  81 mg Oral Daily   methylPREDNISolone (SOLU-MEDROL) injection  40 mg Intravenous Q12H   metoprolol tartrate  12.5 mg Oral BID   nicotine  14 mg Transdermal Daily   pantoprazole (PROTONIX) IV  40 mg Intravenous Q24H   rosuvastatin  20 mg Oral Daily   sodium chloride flush  3 mL Intravenous Q12H   Continuous Infusions:  heparin 1,000 Units/hr (01/31/21 0951)       LOS: 2 days    Time spent: 30 minutes    Ezekiel Slocumb, DO [facility]  01/31/2021, 3:29 PM      If 7PM-7AM, please contact night-coverage. How to contact the [facility] Attending or Consulting provider [place] or covering provider during after hours [place], for this patient?    Check the care team in [facility] and look for a) attending/consulting [facility] provider listed and b) the [facility] team listed Log  into [email or web address] and use [place]'s universal password to access. If you do not have the password, please contact the hospital operator. Locate the [facility] provider you are looking for under [facility] and page to a number that you can be directly reached. If you still have difficulty reaching the provider, please page the [facility] (Director on Call) for the Hospitalists listed on amion for assistance.

## 2021-01-31 NOTE — Progress Notes (Signed)
[facility]    SUBJECTIVE: Patient states he is doing somewhat better less shortness of breath improved leg swelling no significant chest pain slightly improved congestion no fever chills or sweats.   Vitals:   01/31/21 0435 01/31/21 0500 01/31/21 0530 01/31/21 0600  BP: (!) 155/81 (!) 157/81 (!) 153/81 (!) 156/80  Pulse: 89 88 81 78  Resp: (!) 23 (!) _0 Temp: 97.8 F (36.6 C)     TempSrc: Oral     SpO2: 97% (!) 85% 99% 100%  Weight:         Intake/Output Summary (Last 24 hours) at 01/31/2021 0817 Last data filed at 01/31/2021 0443 Gross per 24 hour  Intake 250 ml  Output 2200 ml  Net -1950 ml      PHYSICAL EXAM  General: Well developed, well nourished, in no acute distress HEENT:  Normocephalic and atramatic Neck:  No JVD.  Lungs: Diminished breath sounds clear bilaterally to auscultation and percussion. Heart: HRRR . Normal S1 and S2 without gallops or murmurs.  Abdomen: Bowel sounds are positive, abdomen soft and non-tender  Msk:  Back normal, normal gait. Normal strength and tone for age. Extremities: No clubbing, cyanosis or 2+edema.   Neuro: Alert and oriented X 3. Psych:  Good affect, responds appropriately   LABS: Basic Metabolic Panel: Recent Labs    01/29/21 1700 01/30/21 0410 01/31/21 0434  NA 139 138 139  K 4.2 4.6 4.6  CL 108 109 107  CO2 21* 20* 22  GLUCOSE 104* 166* 128*  BUN 30* 34* 47*  CREATININE 2.23* 2.36* 2.46*  CALCIUM 9.1 8.8* 8.9  MG 2.0  --  2.1  PHOS  --   --  4.4   Liver Function Tests: Recent Labs    01/29/21 1700 01/31/21 0434  AST 29 39  ALT 25 24  ALKPHOS 85 69  BILITOT 1.7* 1.0  PROT 7.9 7.2  ALBUMIN 4.6 4.0   No results for input(s): LIPASE, AMYLASE in the last 72 hours. CBC: Recent Labs    01/29/21 1700 01/30/21 0410 01/30/21 0744 01/30/21 1459 01/31/21 0434  WBC 8.9 4.1  --   --  15.1*  NEUTROABS 7.3  --   --   --   --   HGB 10.9* 9.9*   < > 9.6* 10.2*  HCT 33.1* 30.3*   < > 28.6* 30.7*   MCV 88.3 89.4  --   --  89.0  PLT 218 188  --   --  222   < > = values in this interval not displayed.   Cardiac Enzymes: No results for input(s): CKTOTAL, CKMB, CKMBINDEX, TROPONINI in the last 72 hours. BNP: Invalid input(s): POCBNP D-Dimer: No results for input(s): DDIMER in the last 72 hours. Hemoglobin A1C: Recent Labs    01/29/21 2117  HGBA1C 5.2   Fasting Lipid Panel: Recent Labs    01/31/21 0434  CHOL 129  HDL 38*  LDLCALC 73  TRIG 91  CHOLHDL 3.4   Thyroid Function Tests: Recent Labs    01/31/21 0434  TSH 0.230*   Anemia Panel: Recent Labs    01/30/21 0410  VITAMINB12 531  FOLATE 16.9  FERRITIN 193  TIBC 294  IRON 18*  RETICCTPCT 1.9    DG Chest 2 View  Result Date: 01/29/2021 CLINICAL DATA:  Shortness of breath, syncope EXAM: CHEST - 2 VIEW COMPARISON:  11/11/2015 FINDINGS: Trace bilateral pleural effusions. Bibasilar atelectasis, right greater than left. Heart is normal size. No acute  bony abnormality. IMPRESSION: Bibasilar atelectasis and trace bilateral effusions. Electronically Signed   By: Rolm Baptise M.D.   On: 01/29/2021 17:27   US Venous Img Lower Bilateral  Result Date: 01/29/2021 CLINICAL DATA:  Bilateral leg swelling. Possible pulmonary embolus but unable to obtain chest CT angio due to renal function. Assess for DVT. EXAM: BILATERAL LOWER EXTREMITY VENOUS DOPPLER ULTRASOUND TECHNIQUE: Gray-scale sonography with compression, as well as color and duplex ultrasound, were performed to evaluate the deep venous system(s) from the level of the common femoral vein through the popliteal and proximal calf veins. COMPARISON:  None. FINDINGS: VENOUS Normal compressibility of the common femoral, superficial femoral, and popliteal veins, as well as the visualized calf veins. Visualized portions of profunda femoral vein and great saphenous vein unremarkable. No filling defects to suggest DVT on grayscale or color Doppler imaging. Doppler waveforms show  normal direction of venous flow, normal respiratory plasticity and response to augmentation. OTHER None. Limitations: none IMPRESSION: No evidence of bilateral lower extremity DVT. Electronically Signed   By: Keith Rake M.D.   On: 01/29/2021 22:52   ECHOCARDIOGRAM COMPLETE  Result Date: 01/30/2021    ECHOCARDIOGRAM REPORT   Patient Name:   Gary Mccoy. Date of Exam: 01/30/2021 Medical Rec #:  [id number]             Height:       73.0 in Accession #:    [id number]            Weight:       197.0 lb Date of Birth:  09-20-1950             BSA:          2.138 m Patient Age:    70 years              BP:           157/87 mmHg Patient Gender: M                     HR:           87 bpm. Exam Location:  [facility] Procedure: 2D Echo, Color Doppler and Cardiac Doppler Indications:     I50.21 congestive heart failure-Acute Systolic  History:         Patient has prior history of Echocardiogram examinations. PAD                  and Stroke, Signs/Symptoms:Shortness of Breath and Edema; Risk                  Factors:Dyslipidemia.  Sonographer:     Charmayne Sheer Referring Phys:  [id number] Gretta Cool PATEL Diagnosing Phys: Yolonda Kida MD  Sonographer Comments: Suboptimal parasternal window. IMPRESSIONS  1. Left ventricular ejection fraction, by estimation, is 25 to 30%. The left ventricle has severely decreased function. The left ventricle demonstrates global hypokinesis. The left ventricular internal cavity size was mildly dilated. Left ventricular diastolic parameters are consistent with Grade II diastolic dysfunction (pseudonormalization).  2. Right ventricular systolic function is normal. The right ventricular size is normal.  3. Left atrial size was mildly dilated.  4. The mitral valve is normal in structure. Trivial mitral valve regurgitation.  5. The aortic valve is grossly normal. Aortic valve regurgitation is not visualized. FINDINGS  Left Ventricle: Left ventricular ejection fraction, by estimation, is 25 to 30%.  The left ventricle has severely decreased function. The left ventricle demonstrates global hypokinesis. The left  ventricular internal cavity size was mildly dilated. There is no left ventricular hypertrophy. Left ventricular diastolic parameters are consistent with Grade II diastolic dysfunction (pseudonormalization). Right Ventricle: The right ventricular size is normal. No increase in right ventricular wall thickness. Right ventricular systolic function is normal. Left Atrium: Left atrial size was mildly dilated. Right Atrium: Right atrial size was normal in size. Pericardium: There is no evidence of pericardial effusion. Mitral Valve: The mitral valve is normal in structure. Trivial mitral valve regurgitation. MV peak gradient, 3.0 mmHg. The mean mitral valve gradient is 2.0 mmHg. Tricuspid Valve: The tricuspid valve is normal in structure. Tricuspid valve regurgitation is trivial. Aortic Valve: The aortic valve is grossly normal. Aortic valve regurgitation is not visualized. Aortic valve mean gradient measures 3.0 mmHg. Aortic valve peak gradient measures 4.0 mmHg. Aortic valve area, by VTI measures 2.52 cm. Pulmonic Valve: The pulmonic valve was normal in structure. Pulmonic valve regurgitation is not visualized. Aorta: The ascending aorta was not well visualized. IAS/Shunts: No atrial level shunt detected by color flow Doppler. Additional Comments: There is no pleural effusion.  LEFT VENTRICLE PLAX 2D LVIDd:         5.00 cm      Diastology LVIDs:         4.30 cm      LV e' medial:    4.68 cm/s LV PW:         1.20 cm      LV E/e' medial:  19.9 LV IVS:        1.00 cm      LV e' lateral:   7.40 cm/s LVOT diam:     2.10 cm      LV E/e' lateral: 12.6 LV SV:         59 LV SV Index:   28 LVOT Area:     3.46 cm  LV Volumes (MOD) LV vol d, MOD A2C: 175.0 ml LV vol d, MOD A4C: 120.0 ml LV vol s, MOD A2C: 126.0 ml LV vol s, MOD A4C: 91.3 ml LV SV MOD A2C:     49.0 ml LV SV MOD A4C:     120.0 ml LV SV MOD BP:      35.8  ml RIGHT VENTRICLE RV Basal diam:  2.90 cm LEFT ATRIUM             Index        RIGHT ATRIUM           Index LA diam:        4.10 cm 1.92 cm/m   RA Area:     12.60 cm LA Vol (A2C):   52.5 ml 24.56 ml/m  RA Volume:   29.40 ml  13.75 ml/m LA Vol (A4C):   64.0 ml 29.94 ml/m LA Biplane Vol: 58.3 ml 27.27 ml/m  AORTIC VALVE                    PULMONIC VALVE AV Area (Vmax):    3.14 cm     PV Vmax:       0.94 m/s AV Area (Vmean):   2.66 cm     PV Vmean:      69.500 cm/s AV Area (VTI):     2.52 cm     PV VTI:        0.183 m AV Vmax:           100.00 cm/s  PV Peak grad:  3.5 mmHg AV Vmean:  79.500 cm/s  PV Mean grad:  2.0 mmHg AV VTI:            0.235 m AV Peak Grad:      4.0 mmHg AV Mean Grad:      3.0 mmHg LVOT Vmax:         90.70 cm/s LVOT Vmean:        61.100 cm/s LVOT VTI:          0.171 m LVOT/AV VTI ratio: 0.73  AORTA Ao Root diam: 3.20 cm MITRAL VALVE MV Area (PHT): 6.83 cm    SHUNTS MV Area VTI:   2.93 cm    Systemic VTI:  0.17 m MV Peak grad:  3.0 mmHg    Systemic Diam: 2.10 cm MV Mean grad:  2.0 mmHg MV Vmax:       0.87 m/s MV Vmean:      72.3 cm/s MV Decel Time: 111 msec MV E velocity: 93.00 cm/s MV A velocity: 88.30 cm/s MV E/A ratio:  1.05 Monisha Siebel D Suhayla Chisom MD Electronically signed by Yolonda Kida MD Signature Date/Time: 01/30/2021/2:12:21 PM    Final      Echo moderate to severely depressed left ventricular function at 25 to 30%  TELEMETRY: Sinus rhythm 85 nonspecific findings:  ASSESSMENT AND PLAN:  Principal Problem:   Shortness of breath Active Problems:   Benign essential HTN   Duodenal ulcer disease   Tobacco abuse counseling   Peripheral vascular disease (HCC)   Hyperlipidemia   CVA (cerebral vascular accident) ([place])   Acute respiratory failure with hypoxia (HCC)   Melena   Anemia   Acute systolic (congestive) heart failure ([place]) Demand ischemia this does not represent a non-STEMI  Plan Congestive heart failure acute new onset recommend further evaluation  and therapy including diuretics Acute on chronic renal insufficiency consider nephrology input for management Consider ACE ARB or Arni for renal insufficiency we will consider hydralazine imdur instead Severe peripheral vascular disease continue current management aspirin Plavix patient was on Xarelto Moderate to high-dose statin therapy Lipitor or Crestor History of smoking advised patient refrain from tobacco abuse Hypertension recommend beta-blocker ARB or Arni if renal function tolerates consider adding spironolactone Lower GI bleed with melena we will avoid anticoagulants until GI work-up complete Consider further work-up for ischemia coronary disease and heart failure patient may require cardiac cath Inhalers possibly steroids for COPD type symptoms   Yolonda Kida, MD 01/31/2021 8:17 AM

## 2021-01-31 NOTE — Progress Notes (Signed)
ANTICOAGULATION CONSULT NOTE  Pharmacy Consult for heparin infusion monitoring and titration Indication: chest pain/ACS  No Known Allergies  Patient Measurements: 89.8 kg (from 01/24/21 office visit) Weight: 89.4 kg (197 lb)  Vital Signs: Temp: 97.8 F (36.6 C) (11/16 0435) Temp Source: Oral (11/16 0435) BP: 156/80 (11/16 0600) Pulse Rate: 78 (11/16 0600)  Labs: Recent Labs    01/29/21 1700 01/29/21 2117 01/30/21 0410 01/30/21 0744 01/30/21 1459 01/30/21 2028 01/31/21 0434  HGB 10.9*  --  9.9* 10.0* 9.6*  --  10.2*  HCT 33.1*  --  30.3* 29.8* 28.6*  --  30.7*  PLT 218  --  188  --   --   --  222  APTT 52*  --  >160*  --  92* 94* 79*  LABPROT 21.5*  --   --   --   --   --   --   INR 1.9*  --   --   --   --   --   --   HEPARINUNFRC  --  >1.10* >1.10*  --   --   --  0.53  CREATININE 2.23*  --  2.36*  --   --   --  2.46*  TROPONINIHS 126* 145* 126*  --   --   --   --      Estimated Creatinine Clearance: 32 mL/min (A) (by C-G formula based on SCr of 2.46 mg/dL (H)).   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 male with history of peripheral artery disease, hypertension, embolic CVA, hyperlipidemia, here with progressively worsening shortness of breath, cough, and syncope. He takes rivaroxaban at home with last dose 01/28/21. Additionally MD notes  pleuritic pain which could suggest possible PE with scans pending.   Goal of  Therapy:  Heparin level 0.3-0.7 units/ml aPTT 66 - 102 seconds Monitor platelets by anticoagulation protocol: Yes  11/15 0410 aPTT > 160 supra, HL >1.10 supra 11/15 1459 aPTT 92, therapeutic x1 11/15 2028 aPTT 94, therapeutic x 2 11/16 0434 aPTT 79, thera x 3, HL thera x 1   Plan:  aPTT/HL therapeutic Continue heparin infusion at 1000 units/hr Check aPTT and HL with morning labs, then continue to follow HL if correlation confirmed CBC daily while on heparin  Renda Rolls, PharmD, [facility] 01/31/2021 6:41 AM

## 2021-01-31 NOTE — Progress Notes (Signed)
GI Inpatient Follow-up Note  Subjective:  Patient seen and still with significant O2 requirement. No significant bleeding even on heparin. CBC stable.  Scheduled Inpatient Medications:   aspirin EC  81 mg Oral Daily   furosemide  20 mg Intravenous Q12H   methylPREDNISolone (SOLU-MEDROL) injection  40 mg Intravenous Q12H   metoprolol tartrate  12.5 mg Oral BID   nicotine  14 mg Transdermal Daily   pantoprazole (PROTONIX) IV  40 mg Intravenous Q24H   rosuvastatin  20 mg Oral Daily   sodium chloride flush  3 mL Intravenous Q12H    Continuous Inpatient Infusions:    heparin 1,000 Units/hr (01/31/21 0951)    PRN Inpatient Medications:  acetaminophen **OR** acetaminophen, hydrALAZINE, morphine injection, ondansetron **OR** ondansetron (ZOFRAN) IV  Review of Systems:  Review of Systems  Constitutional:  Negative for chills and fever.  Respiratory:  Positive for cough and shortness of breath.   Cardiovascular:  Negative for chest pain.  Gastrointestinal:  Negative for abdominal pain, blood in stool, nausea and vomiting.  Musculoskeletal:  Negative for joint pain.  Skin:  Negative for itching and rash.  Neurological:  Negative for focal weakness.  Psychiatric/Behavioral:  Negative for substance abuse.   All other systems reviewed and are negative.    Physical Examination: BP (!) 155/84 (BP Location: Right Arm)   Pulse 72   Temp (!) 97.5 F (36.4 C)   Resp 18   Ht 6' (1.829 m)   Wt 86.1 kg   SpO2 100%   BMI 25.76 kg/m  Gen: NAD, alert and oriented x 4 HEENT: PEERLA, EOMI, Neck: supple, no JVD or thyromegaly Chest: Coarse lung sounds Abd: soft, non-tender, non-distended Ext: trace edema Skin: no rash or lesions noted Lymph: no LAD  Data: Lab Results  Component Value Date   WBC 15.1 (H) 01/31/2021   HGB 10.2 (L) 01/31/2021   HCT 30.7 (L) 01/31/2021   MCV 89.0 01/31/2021   PLT 222 01/31/2021   Recent Labs  Lab 01/30/21 0744 01/30/21 1459 01/31/21 0434  HGB  10.0* 9.6* 10.2*   Lab Results  Component Value Date   NA 139 01/31/2021   K 4.6 01/31/2021   CL 107 01/31/2021   CO2 22 01/31/2021   BUN 47 (H) 01/31/2021   CREATININE 2.46 (H) 01/31/2021   Lab Results  Component Value Date   ALT 24 01/31/2021   AST 39 01/31/2021   ALKPHOS 69 01/31/2021   BILITOT 1.0 01/31/2021   Recent Labs  Lab 01/29/21 1700 01/30/21 0410 01/31/21 0434  APTT 52*   < > 79*  INR 1.9*  --   --    < > = values in this interval not displayed.   Assessment/Plan: Mr. Ayotte is a 70 y.o. gentleman with new onset heart failure exacerbation, NSTEMI, and IDA who we were consulted for dark stools which has been going on for weeks but no hemodynamically significant bleeding, even on heparin drip. Patient needs EGD/Colonoscopy for further investigation but only once medically optimized from a cardiac/respiratory standpoint.  Recommendations:  - will need EGD/Colonoscopy once medically optimized - cardiology risk assessment - ok to continue heparin drip but will need to be discontinued prior to any endoscopic procedures for 6 hours - maintain active type and screen and transfuse for hemoglobin < 8 - PPI daily IV - ok for heart healthy diet as no procedures planned for tomorrow, can place on clears tomorrow in case patient is dramatically better tomorrow and can be prepped tomorrow  We will continue to follow closely, please call with any questions or concerns.  Raylene Miyamoto MD, MPH [place]

## 2021-02-01 ENCOUNTER — Inpatient Hospital Stay: Payer: Medicare HMO

## 2021-02-01 DIAGNOSIS — R0602 Shortness of breath: Secondary | ICD-10-CM | POA: Diagnosis not present

## 2021-02-01 LAB — BASIC METABOLIC PANEL
Anion gap: 12 (ref 5–15)
BUN: 50 mg/dL — ABNORMAL HIGH (ref 8–23)
CO2: 22 mmol/L (ref 22–32)
Calcium: 8.6 mg/dL — ABNORMAL LOW (ref 8.9–10.3)
Chloride: 103 mmol/L (ref 98–111)
Creatinine, Ser: 2.17 mg/dL — ABNORMAL HIGH (ref 0.61–1.24)
GFR, Estimated: 32 mL/min — ABNORMAL LOW (ref >60)
Glucose, Bld: 123 mg/dL — ABNORMAL HIGH (ref 70–99)
Potassium: 4.7 mmol/L (ref 3.5–5.1)
Sodium: 137 mmol/L (ref 135–145)

## 2021-02-01 LAB — CBC
HCT: 29.5 % — ABNORMAL LOW (ref 39.0–52.0)
Hemoglobin: 9.7 g/dL — ABNORMAL LOW (ref 13.0–17.0)
MCH: 29 pg (ref 26.0–34.0)
MCHC: 32.9 g/dL (ref 30.0–36.0)
MCV: 88.1 fL (ref 80.0–100.0)
Platelets: 220 10*3/uL (ref 150–400)
RBC: 3.35 MIL/uL — ABNORMAL LOW (ref 4.22–5.81)
RDW: 13.7 % (ref 11.5–15.5)
WBC: 16.6 10*3/uL — ABNORMAL HIGH (ref 4.0–10.5)
nRBC: 0 % (ref 0.0–0.2)

## 2021-02-01 LAB — HEPATITIS B CORE ANTIBODY, IGM: Hep B C IgM: NONREACTIVE

## 2021-02-01 LAB — HEPATITIS B SURFACE ANTIBODY,QUALITATIVE: Hep B S Ab: NONREACTIVE

## 2021-02-01 LAB — HEPARIN LEVEL (UNFRACTIONATED)
Heparin Unfractionated: 0.23 IU/mL — ABNORMAL LOW (ref 0.30–0.70)
Heparin Unfractionated: 0.46 IU/mL (ref 0.30–0.70)
Heparin Unfractionated: 0.14 IU/mL — ABNORMAL LOW (ref 0.30–0.70)

## 2021-02-01 LAB — HEPATITIS B CORE ANTIBODY, TOTAL: Hep B Core Total Ab: NONREACTIVE

## 2021-02-01 LAB — HEPATITIS C ANTIBODY: HCV Ab: NONREACTIVE

## 2021-02-01 LAB — HEPATITIS B SURFACE ANTIGEN: Hepatitis B Surface Ag: NONREACTIVE

## 2021-02-01 LAB — APTT: aPTT: 56 seconds — ABNORMAL HIGH (ref 24–36)

## 2021-02-01 MED ORDER — PEG 3350-KCL-NA BICARB-NACL 420 G PO SOLR
4000.0000 mL | Freq: Once | ORAL | Status: AC
  Administered 2021-02-01: 18:00:00 4000 mL via ORAL
  Filled 2021-02-01: qty 4000

## 2021-02-01 MED ORDER — HEPARIN BOLUS VIA INFUSION
2000.0000 [IU] | Freq: Once | INTRAVENOUS | Status: AC
  Administered 2021-02-01: 20:00:00 2000 [IU] via INTRAVENOUS
  Filled 2021-02-01: qty 2000

## 2021-02-01 MED ORDER — HEPARIN BOLUS VIA INFUSION
1300.0000 [IU] | INTRAVENOUS | Status: AC
  Administered 2021-02-01: 06:00:00 1300 [IU] via INTRAVENOUS
  Filled 2021-02-01: qty 1300

## 2021-02-01 MED ORDER — AMLODIPINE BESYLATE 5 MG PO TABS
5.0000 mg | ORAL_TABLET | Freq: Every day | ORAL | Status: DC
  Administered 2021-02-01 – 2021-02-02 (×2): 5 mg via ORAL
  Filled 2021-02-01 (×2): qty 1

## 2021-02-01 MED ORDER — BISACODYL 5 MG PO TBEC
10.0000 mg | DELAYED_RELEASE_TABLET | Freq: Once | ORAL | Status: AC
  Administered 2021-02-02: 10 mg via ORAL
  Filled 2021-02-01: qty 2

## 2021-02-01 MED ORDER — PREDNISONE 20 MG PO TABS
40.0000 mg | ORAL_TABLET | Freq: Every day | ORAL | Status: DC
  Administered 2021-02-02 – 2021-02-04 (×3): 40 mg via ORAL
  Filled 2021-02-01 (×3): qty 2

## 2021-02-01 NOTE — Progress Notes (Signed)
[place] for heparin infusion monitoring and titration Indication: chest pain/ACS  No Known Allergies  Patient Measurements: 89.8 kg (from 01/24/21 office visit) Height: 6' (182.9 cm) Weight: 87 kg (191 lb 12.8 oz) IBW/kg (Calculated) : 77.6  Vital Signs: Temp: 97.9 F (36.6 C) (11/17 1959) BP: 165/78 (11/17 1959) Pulse Rate: 64 (11/17 1959)  Labs: Recent Labs    01/29/21 2117 01/29/21 2117 01/30/21 0410 01/30/21 0744 01/30/21 1459 01/30/21 2028 01/31/21 0434 02/01/21 0442 02/01/21 1144 02/01/21 1805  HGB  --   --  9.9*   < > 9.6*  --  10.2* 9.7*  --   --   HCT  --   --  30.3*   < > 28.6*  --  30.7* 29.5*  --   --   PLT  --   --  188  --   --   --  222 220  --   --   APTT  --    < > >160*  --  92* 94* 79* 56*  --   --   HEPARINUNFRC >1.10*  --  >1.10*  --   --   --  0.53 0.23* 0.46 0.14*  CREATININE  --   --  2.36*  --   --   --  2.46* 2.17*  --   --   TROPONINIHS 145*  --  126*  --   --   --   --   --   --   --    < > = values in this interval not displayed.     Estimated Creatinine Clearance: 35.3 mL/min (A) (by C-G formula based on SCr of 2.17 mg/dL (H)).   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 male with history of peripheral artery disease, hypertension, embolic CVA, hyperlipidemia, here with progressively worsening shortness of breath, cough, and syncope. He takes rivaroxaban at  home with last dose 01/28/21. Chest CT neg for PE. LE [place] neg for DVT.  Goal of Therapy:  Heparin level 0.3-0.7 units/ml aPTT 66 - 102 seconds Monitor platelets by anticoagulation protocol: Yes  Date Time aPTT/HL Rate/Comment 11/15  0410  >160s/ >1.1 supra, HL >1.10 supra 11/15  1459  92s / --  therapeutic x1; 1000 un/hr 11/15  2028  94s / --  therapeutic x 2; 1000 un/hr 11/16  0434  79s / 0.53 thera x3; 1000 un/hr 11/17  0442  56s / 0.23 subthera; 1000>1200 un/hr 11/17  1144 0.46  Thera x1; 1200 un/hr  11/17 1805 0.14  Subtherapeutic  Baseline Labs: aPTT - unk (drawn after heparin) INR - 1.9 (expected ISO xarelto; CKD) Hgb - 10.9>9.7 (stable/low) Plts - 220 (stable; WNL)   Plan:  Heparin level subtherapeutic. Spoke with nurse, no known interruptions in heparin  infusion during day Heparin 2000 unit bolus Increase heparin infusion to 1400 units/hr Recheck HL in 6 hrs, although heparin to stop at 0600 for EGD/colonoscopy to be performed 11/18 Stop time 0600 entered for heparin infusion; plan relayed to nurse as well Monitor CBC daily while on heparin  Tawnya Crook, PharmD, BCPS Clinical Pharmacist 02/01/2021 8:18 PM

## 2021-02-01 NOTE — Progress Notes (Signed)
Cardiology clearance note  Congestive heart failure systolic dysfunction Cardiomyopathy ejection fraction around 30% COPD Shortness of breath Renal insufficiency Peripheral vascular disease Melena . Patient reasonably stable Will continue supplemental oxygen Agree with nephrology input Continue blood pressure management including beta-blockade therapy Patient is an acceptable risk mild to moderate for GI endoscopy/colonoscopy Cardiology will continue to follow

## 2021-02-01 NOTE — Progress Notes (Signed)
PROGRESS NOTE    Gary Mccoy.   ERX:[id number]  DOB: 1950/08/26  PCP: Rusty Aus, MD    DOA: 01/29/2021 LOS: 3    Brief Narrative / Hospital Course to Date:   70 y.o. male with past medical history significant of CVA, peripheral artery disease, placed on Xarelto by vascular surgery since 2017, chronic combined diastolic and systolic CHF, nonbleeding internal hemorrhoids, diverticulosis noted colonoscopy in 2019, gastritis, nonbleeding duodenal ulcer seen on EGD done in 2017, hyperlipidemia, essential hypertension, GERD who presented to [facility] ED on 01/29/21 with complaints of progressively worsening shortness of breath of 1 week duration, worse with exertion.  Also reported intermittent loose black stools for the past 6 weeks.  Denies use of NSAIDs.  Work-up in the ED revealed acute hypoxic respiratory failure secondary to acute CHF with concern for pulmonary edema.  Elevated troponin, was started on heparin drip and his home Xarelto was held.  Labs also revealed iron deficiency anemia.   Admitted to [facility] service.  Cardiology and GI consulted.  Assessment & Plan   Principal Problem:   Shortness of breath Active Problems:   Benign essential HTN   Duodenal ulcer disease   Tobacco abuse counseling   Peripheral vascular disease ([place])   Hyperlipidemia   CVA (cerebral vascular accident) (Gary Mccoy)   Acute respiratory failure with hypoxia (HCC)   Melena   Anemia   Acute systolic (congestive) heart failure (HCC)   Acute on chronic combined diastolic and systolic CHF - Presented with progressive dyspnea with minimal exertion, elevated BNP >1000, pulmonary edema seen on chest x-ray. Last Echo in 2019 revealed LVEF 45 to 50% with grade 1 diastolic dysfunction. Echo on 01/30/2021 showed LVEF 25 to 30%, left ventricle shows global hypokinesis, grade 2 diastolic dysfunction. --Cardiology consulted --Diuretic transitioned to torsemide 20 mg daily per nephrology --Started on Lopressor  12.5 mg p.o. twice daily --Continue aspirin and statin --Strict I/O's and daily weights --Monitor renal function and electrolytes closely with diuresis --Needs ischemic evaluation, likely as outpatient.  Cardiac cath precluded by renal failure.  Acute exacerbation of suspected COPD versus bronchitis-patient is a 50-year smoker and has poor aeration and diffuse expiratory wheezing on exam consistent with exacerbated obstructive pulmonary disease.  Noted to have a productive sounding cough. --Transition IV steroids to prednisone 40 mg daily --Scheduled Mucinex --As needed albuterol  --Recommend formal PFTs as outpatient --Supplement oxygen as needed to maintain sats above 90% -wean as tolerated   Elevated troponin due to suspect demand ischemia -likely secondary to hypoxia from pulmonary edema.  Troponin on presentation 126, peaked at 145 and downtrending. No acute ischemic changes on EKG.  No active chest pain --Xarelto held on admission --On heparin drip --Telemetry --Cardiology following   AKI on CKD 3B - likely prerenal azotemia due to dehydration from poor p.o. intake Baseline creatinine appears to be 1.6.  Presented with creatinine 2.36. --Nephrology consulted given need for diuresis --Avoid nephrotoxic agents and hypotension --Monitor renal function closely with diuresis, daily BMPs --Monitor urine output --Resume ARB eventually --Follow-up pending SPEP/UPEP and hepatitis panel   Suspected upper GI bleed / intermittent melena Iron deficiency anemia History of gastritis/nonbleeding duodenal ulcer seen on EGD in 2017. History of nonbleeding internal hemorrhoids/diverticulosis seen on colonoscopy done in 2019. Iron deficiency anemia likely secondary to chronic blood loss. Hemoglobin on admission 9.9 --Trend hemoglobin, transfuse if less than 8.0 --Continue IV PPI --On heparin, monitor closely for recurrent bleeding --GI consulted, planning endoscopy - cleared by  cardiology --  Clear liquid diet and n.p.o. after midnight in case EGD tomorrow   Acute hypoxic respiratory failure -likely due to CHF and COPD exacerbation Chest x-ray on admission which shows increasing pulmonary vascularity, bilateral pleural effusions. Requiring 4 liters per minute supplemental oxygen on admission. Not on oxygen supplementation at baseline. -- Management of CHF and COPD as above --Supplemental oxygen to maintain sats above 90%, wean as tolerated --Incentive spirometry 11/17: Appears weaned from 4 to 3 L/min with sats 95 to 100%   Sinus tachycardia -present on admission.  TSH mildly low at 0.230, normal free T4 at 0.86. --Treat underlying conditions as above --Telemetry --Metoprolol started on admission   Hyperlipidemia -on Crestor Fasting lipid panel: Cholesterol 129, HDL low 38, LDL 73, TGs 91    Patient BMI: Body mass index is 26.01 kg/m.   DVT prophylaxis:    Diet:  Diet Orders (From admission, onward)     Start     Ordered   02/01/21 1237  Diet Heart Room service appropriate? Yes; Fluid consistency: Thin; Fluid restriction: 1500 mL Fluid  Diet effective now       Question Answer Comment  Room service appropriate? Yes   Fluid consistency: Thin   Fluid restriction: 1500 mL Fluid      02/01/21 1237              Code Status: DNR   Subjective 02/01/21    Patient seen awake sitting up in bed this morning.  Wife and brother at bedside.  He reports still getting very short of breath on exertion.  Does asked to be allowed to take a shower.  Still has raspy sounding cough but denies fevers or chills.  No other acute complaints.   Disposition Plan & Communication   Status is: Inpatient  Remains inpatient appropriate because: On IV therapies as above and pending GI evaluation once respiratory status is improved    Family Communication: Wife and brother at bedside on rounds today at 11/17   Consults, Procedures, Significant Events    Consultants:  Cardiology Gastroenterology Nephrology  Procedures:  None  Antimicrobials:  Anti-infectives (From admission, onward)    None         Micro    Objective   Vitals:   02/01/21 0000 02/01/21 0257 02/01/21 0349 02/01/21 1156  BP: 132/66  128/66 (!) 155/69  Pulse: 68  63 69  Resp: (!) _0 Temp: 98.6 F (37 C)  98 F (36.7 C) 97.9 F (36.6 C)  TempSrc: Oral  Oral   SpO2: 96%  95% 100%  Weight:  87 kg    Height:        Intake/Output Summary (Last 24 hours) at 02/01/2021 1510 Last data filed at 02/01/2021 1424 Gross per 24 hour  Intake 408.25 ml  Output 2250 ml  Net -1841.75 ml   Filed Weights   01/30/21 1455 01/31/21 1007 02/01/21 0257  Weight: 89.4 kg 86.1 kg 87 kg    Physical Exam:  General exam: awake, alert, no acute distress Respiratory system: Continues to have poor air movement but expiratory wheezes improved, normal respiratory effort, on 4 L/min nasal cannula oxygen with charted sats in the upper 90s. Cardiovascular system: normal S1/S2, RRR, no JVD, no lower extremity edema.   Central nervous system: A&O x4. no gross focal neurologic deficits, normal speech Extremities: moves all, no cyanosis, normal tone Skin: dry, intact, normal temperature, no rashes seen on visualized skin Psychiatry: normal mood, congruent affect, judgement and insight  appear normal  Labs   Data Reviewed: I have personally reviewed following labs and imaging studies  CBC: Recent Labs  Lab 01/29/21 1700 01/30/21 0410 01/30/21 0744 01/30/21 1459 01/31/21 0434 02/01/21 0442  WBC 8.9 4.1  --   --  15.1* 16.6*  NEUTROABS 7.3  --   --   --   --   --   HGB 10.9* 9.9* 10.0* 9.6* 10.2* 9.7*  HCT 33.1* 30.3* 29.8* 28.6* 30.7* 29.5*  MCV 88.3 89.4  --   --  89.0 88.1  PLT 218 188  --   --  222 341   Basic Metabolic Panel: Recent Labs  Lab 01/29/21 1700 01/30/21 0410 01/31/21 0434 02/01/21 0442  NA 139 138 139 137  K 4.2 4.6 4.6 4.7  CL 108 109  107 103  CO2 21* 20* 22 22  GLUCOSE 104* 166* 128* 123*  BUN 30* 34* 47* 50*  CREATININE 2.23* 2.36* 2.46* 2.17*  CALCIUM 9.1 8.8* 8.9 8.6*  MG 2.0  --  2.1  --   PHOS  --   --  4.4  --    GFR: Estimated Creatinine Clearance: 35.3 mL/min (A) (by C-G formula based on SCr of 2.17 mg/dL (H)). Liver Function Tests: Recent Labs  Lab 01/29/21 1700 01/31/21 0434  AST 29 39  ALT 25 24  ALKPHOS 85 69  BILITOT 1.7* 1.0  PROT 7.9 7.2  ALBUMIN 4.6 4.0   No results for input(s): LIPASE, AMYLASE in the last 168 hours. No results for input(s): AMMONIA in the last 168 hours. Coagulation Profile: Recent Labs  Lab 01/29/21 1700  INR 1.9*   Cardiac Enzymes: No results for input(s): CKTOTAL, CKMB, CKMBINDEX, TROPONINI in the last 168 hours. BNP (last 3 results) No results for input(s): PROBNP in the last 8760 hours. HbA1C: Recent Labs    01/29/21 2117  HGBA1C 5.2   CBG: No results for input(s): GLUCAP in the last 168 hours. Lipid Profile: Recent Labs    01/31/21 0434  CHOL 129  HDL 38*  LDLCALC 73  TRIG 91  CHOLHDL 3.4   Thyroid Function Tests: Recent Labs    01/31/21 0434  TSH 0.230*  FREET4 0.86   Anemia Panel: Recent Labs    01/30/21 0410  VITAMINB12 531  FOLATE 16.9  FERRITIN 193  TIBC 294  IRON 18*  RETICCTPCT 1.9   Sepsis Labs: No results for input(s): PROCALCITON, LATICACIDVEN in the last 168 hours.  Recent Results (from the past 240 hour(s))  Resp Panel by RT-PCR (Flu A&B, Covid) Nasopharyngeal Swab     Status: None   Collection Time: 01/29/21  9:17 PM   Specimen: Nasopharyngeal Swab; Nasopharyngeal(NP) swabs in vial transport medium  Result Value Ref Range Status   SARS Coronavirus 2 by RT PCR NEGATIVE NEGATIVE Final    Comment: (NOTE) SARS-CoV-2 target nucleic acids are NOT DETECTED.  The SARS-CoV-2 RNA is generally detectable in upper respiratory specimens during the acute phase of infection. The lowest concentration of SARS-CoV-2 viral  copies this assay can detect is 138 copies/mL. A negative result does not preclude SARS-Cov-2 infection and should not be used as the sole basis for treatment or other patient management decisions. A negative result may occur with  improper specimen collection/handling, submission of specimen other than nasopharyngeal swab, presence of viral mutation(s) within the areas targeted by this assay, and inadequate number of viral copies(<138 copies/mL). A negative result must be combined with clinical observations, patient history, and epidemiological information. The  expected result is Negative.  Fact Sheet for Patients:  EntrepreneurPulse.com.au  Fact Sheet for Healthcare Providers:  IncredibleEmployment.be  This test is no t yet approved or cleared by the [place] FDA and  has been authorized for detection and/or diagnosis of SARS-CoV-2 by FDA under an Emergency Use Authorization (EUA). This EUA will remain  in effect (meaning this test can be used) for the duration of the COVID-19 declaration under Section 564(b)(1) of the Act, 21 U.S.C.section 360bbb-3(b)(1), unless the authorization is terminated  or revoked sooner.       Influenza A by PCR NEGATIVE NEGATIVE Final   Influenza B by PCR NEGATIVE NEGATIVE Final    Comment: (NOTE) The Xpert Xpress SARS-CoV-2/FLU/RSV plus assay is intended as an aid in the diagnosis of influenza from Nasopharyngeal swab specimens and should not be used as a sole basis for treatment. Nasal washings and aspirates are unacceptable for Xpert Xpress SARS-CoV-2/FLU/RSV testing.  Fact Sheet for Patients: EntrepreneurPulse.com.au  Fact Sheet for Healthcare Providers: IncredibleEmployment.be  This test is not yet approved or cleared by the [place] FDA and has been authorized for detection and/or diagnosis of SARS-CoV-2 by FDA under an Emergency Use Authorization (EUA). This  EUA will remain in effect (meaning this test can be used) for the duration of the COVID-19 declaration under Section 564(b)(1) of the Act, 21 U.S.C. section 360bbb-3(b)(1), unless the authorization is terminated or revoked.  Performed at [facility], [address], [place], [place] [address]       Imaging Studies   US RENAL  Result Date: 02-16-21 CLINICAL DATA:  Acute kidney failure.  Hematuria EXAM: RENAL / URINARY TRACT ULTRASOUND COMPLETE COMPARISON:  CT abdomen pelvis 10/15/2019 FINDINGS: Right Kidney: Renal measurements: 7.0 x 4.1 x 3.4 cm = volume: 50 mL. Echogenicity within normal limits. No mass or hydronephrosis visualized. Left Kidney: Renal measurements: 10.8 x 5.1 x 4.7 cm = volume: 134 mL. Small arterial calcifications noted on CT. Echogenicity within normal limits. No mass or hydronephrosis visualized. Bladder: Appears normal for degree of bladder distention. Other: Moderately large pleural effusions bilaterally with bibasilar atelectasis IMPRESSION: Negative for renal obstruction.  Atrophic right kidney Moderately large bilateral pleural effusions. Electronically Signed   By: Franchot Gallo M.D.   On: 02/16/21 10:00     Medications   Scheduled Meds:  amLODipine  5 mg Oral Daily   aspirin EC  81 mg Oral Daily   dextromethorphan-guaiFENesin  1 tablet Oral BID   methylPREDNISolone (SOLU-MEDROL) injection  40 mg Intravenous Q12H   metoprolol tartrate  12.5 mg Oral BID   nicotine  14 mg Transdermal Daily   pantoprazole (PROTONIX) IV  40 mg Intravenous Q24H   rosuvastatin  20 mg Oral Daily   sodium chloride flush  3 mL Intravenous Q12H   torsemide  20 mg Oral Daily   Continuous Infusions:  heparin 1,200 Units/hr (16-Feb-2021 0623)       LOS: 3 days    Time spent: 30 minutes with > 50% spent at bedside and in coordination of care     Ezekiel Slocumb, DO [facility]  16-Feb-2021, 3:10 PM      If 7PM-7AM, please contact  night-coverage. How to contact the [facility] Attending or Consulting provider [place] or covering provider during after hours [place], for this patient?    Check the care team in [facility] and look for a) attending/consulting TR[facility] provider listed and b) the [facility] team listed Log into [email or web address] and use [facility]'s universal password to access. If you do not have the password, please contact the hospital operator. Locate the [facility] provider you are looking for under [facility] and page to a number that you can be directly reached. If you still have difficulty reaching the provider, please page the [facility] (Director on Call) for the Hospitalists listed on amion for assistance.

## 2021-02-01 NOTE — Progress Notes (Signed)
[place] for heparin infusion monitoring and titration Indication: chest pain/ACS  No Known Allergies  Patient Measurements: 89.8 kg (from 01/24/21 office visit) Height: 6' (182.9 cm) Weight: 87 kg (191 lb 12.8 oz) IBW/kg (Calculated) : 77.6  Vital Signs: Temp: 97.9 F (36.6 C) (11/17 1156) Temp Source: Oral (11/17 0349) BP: 155/69 (11/17 1156) Pulse Rate: 69 (11/17 1156)  Labs: Recent Labs    01/29/21 1700 01/29/21 1700 01/29/21 2117 01/30/21 0410 01/30/21 0744 01/30/21 1459 01/30/21 2028 01/31/21 0434 02/01/21 0442 02/01/21 1144  HGB 10.9*  --   --  9.9*   < > 9.6*  --  10.2* 9.7*  --   HCT 33.1*  --   --  30.3*   < > 28.6*  --  30.7* 29.5*  --   PLT 218  --   --  188  --   --   --  222 220  --   APTT 52*  --   --  >160*  --  92* 94* 79* 56*  --   LABPROT 21.5*  --   --   --   --   --   --   --   --   --   INR 1.9*  --   --   --   --   --   --   --   --   --   HEPARINUNFRC  --    < > >1.10* >1.10*  --   --   --  0.53 0.23* 0.46  CREATININE 2.23*  --   --  2.36*  --   --   --  2.46* 2.17*  --   TROPONINIHS 126*  --  145* 126*  --   --   --   --   --   --    < > = values in this interval not displayed.     Estimated Creatinine Clearance: 35.3 mL/min (A) (by C-G formula based on SCr of 2.17 mg/dL (H)).   Medical History: Past Medical History:  Diagnosis Date   Arthritis    lower back   Back pain    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    Complication of anesthesia    Violent with hallucinations after procedure 10/2015   DDD (degenerative disc disease), lumbar    Duodenal ulcer    Elevated lipids    GERD (gastroesophageal reflux disease)    Leg weakness, bilateral    s/p lumbar surgery   PAD (peripheral artery disease) ([place])    Polyradiculitis    Stroke ([place])    Rt lower leg  estimated 10 - 12 stokes in last 5 years   Wears dentures    full upper and lower      Assessment: 70 y.o. male with history of peripheral artery  disease, hypertension, embolic CVA, hyperlipidemia, here with progressively worsening shortness of breath, cough, and syncope. He takes rivaroxaban at home with last dose 01/28/21. Chest CT neg for PE. LE [place] neg for DVT.  Goal of Therapy:  Heparin level 0.3-0.7 units/ml aPTT 66 - 102 seconds Monitor platelets by anticoagulation protocol: Yes  Date Time aPTT/HL Rate/Comment 11/15 0410  >160s/ >1.1 supra, HL >1.10 supra 11/15 1459  92s / --  therapeutic x1; 1000 un/hr 11/15 2028  94s / --  therapeutic x 2; 1000 un/hr 11/16 0434  79s / 0.53 thera x3; 1000 un/hr 11/17 0442  56s / 0.23 subthera; 1000>1200 un/hr 11/17 1144 0.46  Thera  x1; 1200 un/hr   Baseline Labs: aPTT - unk (drawn after heparin) INR - 1.9 (expected ISO xarelto; CKD) Hgb - 10.9>9.7 (stable/low) Plts - 220 (stable; WNL)   Plan:  HL therapeutic x1 after rate change. Prior two aPTT/HL appear to be correlating. Dose by HL alone. Continue heparin infusion at 1200 units/hr Recheck HL in 6 hrs to confirm rate then daily once consecutively therapeutic. CTM CBC daily while on heparin  Lorna Dibble, PharmD, [place] Pharmacist 02/01/2021 12:17 PM

## 2021-02-01 NOTE — Care Plan (Signed)
Clinically stable for EGD/Colon. Will schedule for tomorrow. Clear liquid diet. NPO at midnight except prep. 1/2 tonight and other 1/2 tomorrow. If not clear by 9 am he can have more prep if needed.  Please hold heparin drip at 6 am.  Please call with any questions or concerns.  Raylene Miyamoto MD, MPH [place]

## 2021-02-01 NOTE — Progress Notes (Signed)
[facility]  ROUNDING NOTE   Subjective:   Patient states he is breathing better. Less cough and wheeze.   UOP 1694m.   [place] 2.17 (2.46)  Objective:  Vital signs in last 24 hours:  Temp:  [97.9 F (36.6 C)-98.6 F (37 C)] 97.9 F (36.6 C) (11/17 1156) Pulse Rate:  [63-69] 69 (11/17 1156) Resp:  [15-24] 18 (11/17 1156) BP: (128-169)/(66-80) 155/69 (11/17 1156) SpO2:  [95 %-100 %] 100 % (11/17 1156) Weight:  [87 kg] 87 kg (11/17 0257)  Weight change: -3.221 kg Filed Weights   01/30/21 1455 01/31/21 1007 02/01/21 0257  Weight: 89.4 kg 86.1 kg 87 kg    Intake/Output: I/O last 3 completed shifts: In: 662.1 [P.O.:120; I.V.:542.1] Out: 2450 [Urine:2450]   Intake/Output this shift:  No intake/output data recorded.  Physical Exam: General: NAD, sitting up in bed  Head: Normocephalic, atraumatic. Moist oral mucosal membranes  Eyes: Anicteric, PERRL  Neck: Supple, trachea midline  Lungs:  Clear to auscultation, 3 L [place]  Heart: Regular rate and rhythm  Abdomen:  Soft, nontender,   Extremities:  no peripheral edema.  Neurologic: Nonfocal, moving all four extremities  Skin: No lesions        Basic Metabolic Panel: Recent Labs  Lab 01/29/21 1700 01/30/21 0410 01/31/21 0434 02/01/21 0442  NA 139 138 139 137  K 4.2 4.6 4.6 4.7  CL 108 109 107 103  CO2 21* 20* 22 22  GLUCOSE 104* 166* 128* 123*  BUN 30* 34* 47* 50*  CREATININE 2.23* 2.36* 2.46* 2.17*  CALCIUM 9.1 8.8* 8.9 8.6*  MG 2.0  --  2.1  --   PHOS  --   --  4.4  --     Liver Function Tests: Recent Labs  Lab 01/29/21 1700 01/31/21 0434  AST 29 39  ALT 25 24  ALKPHOS 85 69  BILITOT 1.7* 1.0  PROT 7.9 7.2  ALBUMIN 4.6 4.0   No results for input(s): LIPASE, AMYLASE in the last 168 hours. No results for input(s): AMMONIA in the last 168 hours.  CBC: Recent Labs  Lab 01/29/21 1700 01/30/21 0410 01/30/21 0744 01/30/21 1459 01/31/21 0434 02/01/21 0442  WBC 8.9 4.1  --   --   15.1* 16.6*  NEUTROABS 7.3  --   --   --   --   --   HGB 10.9* 9.9* 10.0* 9.6* 10.2* 9.7*  HCT 33.1* 30.3* 29.8* 28.6* 30.7* 29.5*  MCV 88.3 89.4  --   --  89.0 88.1  PLT 218 188  --   --  222 220    Cardiac Enzymes: No results for input(s): CKTOTAL, CKMB, CKMBINDEX, TROPONINI in the last 168 hours.  BNP: Invalid input(s): POCBNP  CBG: No results for input(s): GLUCAP in the last 168 hours.  Microbiology: Results for orders placed or performed during the hospital encounter of 01/29/21  Resp Panel by RT-PCR (Flu A&B, Covid) Nasopharyngeal Swab     Status: None   Collection Time: 01/29/21  9:17 PM   Specimen: Nasopharyngeal Swab; Nasopharyngeal(NP) swabs in vial transport medium  Result Value Ref Range Status   SARS Coronavirus 2 by RT PCR NEGATIVE NEGATIVE Final    Comment: (NOTE) SARS-CoV-2 target nucleic acids are NOT DETECTED.  The SARS-CoV-2 RNA is generally detectable in upper respiratory specimens during the acute phase of infection. The lowest concentration of SARS-CoV-2 viral copies this assay can detect is 138 copies/mL. A negative result does not preclude SARS-Cov-2 infection and should not  be used as the sole basis for treatment or other patient management decisions. A negative result may occur with  improper specimen collection/handling, submission of specimen other than nasopharyngeal swab, presence of viral mutation(s) within the areas targeted by this assay, and inadequate number of viral copies(<138 copies/mL). A negative result must be combined with clinical observations, patient history, and epidemiological information. The expected result is Negative.  Fact Sheet for Patients:  EntrepreneurPulse.com.au  Fact Sheet for Healthcare Providers:  IncredibleEmployment.be  This test is no t yet approved or cleared by the [place] FDA and  has been authorized for detection and/or diagnosis of SARS-CoV-2 by FDA under an  Emergency Use Authorization (EUA). This EUA will remain  in effect (meaning this test can be used) for the duration of the COVID-19 declaration under Section 564(b)(1) of the Act, 21 U.S.C.section 360bbb-3(b)(1), unless the authorization is terminated  or revoked sooner.       Influenza A by PCR NEGATIVE NEGATIVE Final   Influenza B by PCR NEGATIVE NEGATIVE Final    Comment: (NOTE) The Xpert Xpress SARS-CoV-2/FLU/RSV plus assay is intended as an aid in the diagnosis of influenza from Nasopharyngeal swab specimens and should not be used as a sole basis for treatment. Nasal washings and aspirates are unacceptable for Xpert Xpress SARS-CoV-2/FLU/RSV testing.  Fact Sheet for Patients: EntrepreneurPulse.com.au  Fact Sheet for Healthcare Providers: IncredibleEmployment.be  This test is not yet approved or cleared by the [place] FDA and has been authorized for detection and/or diagnosis of SARS-CoV-2 by FDA under an Emergency Use Authorization (EUA). This EUA will remain in effect (meaning this test can be used) for the duration of the COVID-19 declaration under Section 564(b)(1) of the Act, 21 U.S.C. section 360bbb-3(b)(1), unless the authorization is terminated or revoked.  Performed at [facility], [address] [place], [place] [address]     Coagulation Studies: Recent Labs    01/29/21 1700  LABPROT 21.5*  INR 1.9*    Urinalysis: Recent Labs    01/30/21 1540  COLORURINE STRAW*  LABSPEC 1.005  PHURINE 5.0  GLUCOSEU NEGATIVE  HGBUR SMALL*  BILIRUBINUR NEGATIVE  KETONESUR NEGATIVE  PROTEINUR NEGATIVE  NITRITE NEGATIVE  LEUKOCYTESUR NEGATIVE      Imaging: US RENAL  Result Date: 02/01/2021 CLINICAL DATA:  Acute kidney failure.  Hematuria EXAM: RENAL / URINARY TRACT ULTRASOUND COMPLETE COMPARISON:  CT abdomen pelvis 10/15/2019 FINDINGS: Right Kidney: Renal measurements: 7.0 x 4.1 x 3.4 cm = volume: 50 mL.  Echogenicity within normal limits. No mass or hydronephrosis visualized. Left Kidney: Renal measurements: 10.8 x 5.1 x 4.7 cm = volume: 134 mL. Small arterial calcifications noted on CT. Echogenicity within normal limits. No mass or hydronephrosis visualized. Bladder: Appears normal for degree of bladder distention. Other: Moderately large pleural effusions bilaterally with bibasilar atelectasis IMPRESSION: Negative for renal obstruction.  Atrophic right kidney Moderately large bilateral pleural effusions. Electronically Signed   By: Franchot Gallo M.D.   On: 02/01/2021 10:00   ECHOCARDIOGRAM COMPLETE  Result Date: 01/30/2021    ECHOCARDIOGRAM REPORT   Patient Name:   Gary Mccoy. Date of Exam: 01/30/2021 Medical Rec #:  [id number]             Height:       73.0 in Accession #:    [id number]            Weight:       197.0 lb Date of Birth:  Aug 27, 1950  BSA:          2.138 m Patient Age:    33 years              BP:           157/87 mmHg Patient Gender: M                     HR:           87 bpm. Exam Location:  [facility] Procedure: 2D Echo, Color Doppler and Cardiac Doppler Indications:     I50.21 congestive heart failure-Acute Systolic  History:         Patient has prior history of Echocardiogram examinations. PAD                  and Stroke, Signs/Symptoms:Shortness of Breath and Edema; Risk                  Factors:Dyslipidemia.  Sonographer:     Charmayne Sheer Referring Phys:  [id number] Gretta Cool PATEL Diagnosing Phys: Yolonda Kida MD  Sonographer Comments: Suboptimal parasternal window. IMPRESSIONS  1. Left ventricular ejection fraction, by estimation, is 25 to 30%. The left ventricle has severely decreased function. The left ventricle demonstrates global hypokinesis. The left ventricular internal cavity size was mildly dilated. Left ventricular diastolic parameters are consistent with Grade II diastolic dysfunction (pseudonormalization).  2. Right ventricular systolic function is normal. The  right ventricular size is normal.  3. Left atrial size was mildly dilated.  4. The mitral valve is normal in structure. Trivial mitral valve regurgitation.  5. The aortic valve is grossly normal. Aortic valve regurgitation is not visualized. FINDINGS  Left Ventricle: Left ventricular ejection fraction, by estimation, is 25 to 30%. The left ventricle has severely decreased function. The left ventricle demonstrates global hypokinesis. The left ventricular internal cavity size was mildly dilated. There is no left ventricular hypertrophy. Left ventricular diastolic parameters are consistent with Grade II diastolic dysfunction (pseudonormalization). Right Ventricle: The right ventricular size is normal. No increase in right ventricular wall thickness. Right ventricular systolic function is normal. Left Atrium: Left atrial size was mildly dilated. Right Atrium: Right atrial size was normal in size. Pericardium: There is no evidence of pericardial effusion. Mitral Valve: The mitral valve is normal in structure. Trivial mitral valve regurgitation. MV peak gradient, 3.0 mmHg. The mean mitral valve gradient is 2.0 mmHg. Tricuspid Valve: The tricuspid valve is normal in structure. Tricuspid valve regurgitation is trivial. Aortic Valve: The aortic valve is grossly normal. Aortic valve regurgitation is not visualized. Aortic valve mean gradient measures 3.0 mmHg. Aortic valve peak gradient measures 4.0 mmHg. Aortic valve area, by VTI measures 2.52 cm. Pulmonic Valve: The pulmonic valve was normal in structure. Pulmonic valve regurgitation is not visualized. Aorta: The ascending aorta was not well visualized. IAS/Shunts: No atrial level shunt detected by color flow Doppler. Additional Comments: There is no pleural effusion.  LEFT VENTRICLE PLAX 2D LVIDd:         5.00 cm      Diastology LVIDs:         4.30 cm      LV e' medial:    4.68 cm/s LV PW:         1.20 cm      LV E/e' medial:  19.9 LV IVS:        1.00 cm      LV e'  lateral:   7.40 cm/s LVOT diam:  2.10 cm      LV E/e' lateral: 12.6 LV SV:         59 LV SV Index:   28 LVOT Area:     3.46 cm  LV Volumes (MOD) LV vol d, MOD A2C: 175.0 ml LV vol d, MOD A4C: 120.0 ml LV vol s, MOD A2C: 126.0 ml LV vol s, MOD A4C: 91.3 ml LV SV MOD A2C:     49.0 ml LV SV MOD A4C:     120.0 ml LV SV MOD BP:      35.8 ml RIGHT VENTRICLE RV Basal diam:  2.90 cm LEFT ATRIUM             Index        RIGHT ATRIUM           Index LA diam:        4.10 cm 1.92 cm/m   RA Area:     12.60 cm LA Vol (A2C):   52.5 ml 24.56 ml/m  RA Volume:   29.40 ml  13.75 ml/m LA Vol (A4C):   64.0 ml 29.94 ml/m LA Biplane Vol: 58.3 ml 27.27 ml/m  AORTIC VALVE                    PULMONIC VALVE AV Area (Vmax):    3.14 cm     PV Vmax:       0.94 m/s AV Area (Vmean):   2.66 cm     PV Vmean:      69.500 cm/s AV Area (VTI):     2.52 cm     PV VTI:        0.183 m AV Vmax:           100.00 cm/s  PV Peak grad:  3.5 mmHg AV Vmean:          79.500 cm/s  PV Mean grad:  2.0 mmHg AV VTI:            0.235 m AV Peak Grad:      4.0 mmHg AV Mean Grad:      3.0 mmHg LVOT Vmax:         90.70 cm/s LVOT Vmean:        61.100 cm/s LVOT VTI:          0.171 m LVOT/AV VTI ratio: 0.73  AORTA Ao Root diam: 3.20 cm MITRAL VALVE MV Area (PHT): 6.83 cm    SHUNTS MV Area VTI:   2.93 cm    Systemic VTI:  0.17 m MV Peak grad:  3.0 mmHg    Systemic Diam: 2.10 cm MV Mean grad:  2.0 mmHg MV Vmax:       0.87 m/s MV Vmean:      72.3 cm/s MV Decel Time: 111 msec MV E velocity: 93.00 cm/s MV A velocity: 88.30 cm/s MV E/A ratio:  1.05 Dwayne D Callwood MD Electronically signed by Yolonda Kida MD Signature Date/Time: 01/30/2021/2:12:21 PM    Final      Medications:    heparin 1,200 Units/hr (02/01/21 2458)    aspirin EC  81 mg Oral Daily   dextromethorphan-guaiFENesin  1 tablet Oral BID   methylPREDNISolone (SOLU-MEDROL) injection  40 mg Intravenous Q12H   metoprolol tartrate  12.5 mg Oral BID   nicotine  14 mg Transdermal Daily    pantoprazole (PROTONIX) IV  40 mg Intravenous Q24H   rosuvastatin  20 mg Oral Daily   sodium chloride flush  3 mL Intravenous Q12H   torsemide  20 mg Oral Daily   acetaminophen **OR** acetaminophen, albuterol, hydrALAZINE, morphine injection, ondansetron **OR** ondansetron (ZOFRAN) IV  Assessment/ Plan:  Mr. Verne Lanuza. is a 70 y.o. white male with COPD, hypertension, peripheral vascular disease, CVA, CO exposure, peptic ulcer disease, who was admitted to [facility] on 01/29/2021 for Respiratory distress [R06.03] Elevated troponin [R77.8] Acute respiratory failure with hypoxia (HCC) [Z74.71] Acute systolic (congestive) heart failure (HCC) [I50.21] Acute on chronic congestive heart failure, unspecified heart failure type (HCC) [I50.9]   Acute kidney injury on chronic kidney disease stage IIIB: baseline creatinine of 1.8, GFR of 38. Chronic kidney disease seems to be secondary to NSAID (years of using meloxicam and celecoxib). No IV contrast exposure. Renal ultrasound without obstruction but with atrophic right kidney - Pending SPEP/UPEP and hepatitis status - Will eventually need to restart ARB in the future.    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 - start fluid restriction   Hypertension: 155/69- not at goal. Driven by COPD and systemic steroids. Prior to admission, home regimen included amlodipine 565m olmesartan and hydrochlorothiazide - restart amlodipine.    Hematuria: patient has had hematuria on urinalysis in 2019. Ultrasound with no cause. Recommend urology referral as outpatient.  - check serologies.    LOS: 3 Kaleena Corrow 11/17/202212:32 PM

## 2021-02-01 NOTE — Care Management Important Message (Signed)
Important Message  Patient Details  Name: Gary Mccoy. MRN: [id number] Date of Birth: [birth year]   Medicare Important Message Given:  Yes     [name] 02/01/2021, 11:32 AM

## 2021-02-01 NOTE — Progress Notes (Signed)
[place] for heparin infusion monitoring and titration Indication: chest pain/ACS  No Known Allergies  Patient Measurements: 89.8 kg (from 01/24/21 office visit) Height: 6' (182.9 cm) Weight: 87 kg (191 lb 12.8 oz) IBW/kg (Calculated) : 77.6  Vital Signs: Temp: 98 F (36.7 C) (11/17 0349) Temp Source: Oral (11/17 0349) BP: 128/66 (11/17 0349) Pulse Rate: 63 (11/17 0349)  Labs: Recent Labs    01/29/21 1700 01/29/21 1700 01/29/21 2117 01/30/21 0410 01/30/21 0744 01/30/21 1459 01/30/21 2028 01/31/21 0434 02/01/21 0442  HGB 10.9*  --   --  9.9*   < > 9.6*  --  10.2* 9.7*  HCT 33.1*  --   --  30.3*   < > 28.6*  --  30.7* 29.5*  PLT 218  --   --  188  --   --   --  222 220  APTT 52*  --   --  >160*  --  92* 94* 79* 56*  LABPROT 21.5*  --   --   --   --   --   --   --   --   INR 1.9*  --   --   --   --   --   --   --   --   HEPARINUNFRC  --    < > >1.10* >1.10*  --   --   --  0.53 0.23*  CREATININE 2.23*  --   --  2.36*  --   --   --  2.46* 2.17*  TROPONINIHS 126*  --  145* 126*  --   --   --   --   --    < > = values in this interval not displayed.     Estimated Creatinine Clearance: 35.3 mL/min (A) (by C-G formula based on SCr of 2.17 mg/dL (H)).   Medical History: Past Medical History:  Diagnosis Date   Arthritis    lower back   Back pain    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    Complication of anesthesia    Violent with hallucinations after procedure 10/2015   DDD (degenerative disc disease), lumbar    Duodenal ulcer    Elevated lipids    GERD (gastroesophageal reflux disease)    Leg weakness, bilateral    s/p lumbar surgery   PAD (peripheral artery disease) ([place])    Polyradiculitis    Stroke ([place])    Rt lower leg  estimated 10 - 12 stokes in last 5 years   Wears dentures    full upper and lower      Assessment: 70 y.o. male with history of peripheral artery disease, hypertension, embolic CVA, hyperlipidemia, here with  progressively worsening shortness of breath, cough, and syncope. He takes rivaroxaban at home with last dose 01/28/21. Additionally MD notes  pleuritic pain which could suggest possible PE with scans pending.   Goal of Therapy:  Heparin level 0.3-0.7 units/ml aPTT 66 - 102 seconds Monitor platelets by anticoagulation protocol: Yes  11/15 0410 aPTT > 160 supra, HL >1.10 supra 11/15 1459 aPTT 92, therapeutic x1 11/15 2028 aPTT 94, therapeutic x 2 11/16 0434 aPTT 79, thera x 3, HL thera x 1 11/17 0442 HL 0.23, subthera, aPTT 56, subthera   Plan:  aPTT/HL subtherapeutic Bolus 1300 units x 1 Increase heparin infusion to 1200 units/hr Recheck HL in 6 hr after rate change. CBC daily while on heparin  Renda Rolls, PharmD, [facility] 02/01/2021 5:53 AM

## 2021-02-02 ENCOUNTER — Encounter
Admission: EM | Disposition: A | Discharge status: Home / Self Care | Payer: Self-pay | Source: Home / Self Care | Attending: Internal Medicine

## 2021-02-02 ENCOUNTER — Inpatient Hospital Stay: Payer: Medicare HMO | Admitting: Certified Registered"

## 2021-02-02 ENCOUNTER — Encounter: Payer: Self-pay | Admitting: Internal Medicine

## 2021-02-02 DIAGNOSIS — R0602 Shortness of breath: Secondary | ICD-10-CM | POA: Diagnosis not present

## 2021-02-02 HISTORY — PX: COLONOSCOPY WITH PROPOFOL: SHX5780

## 2021-02-02 HISTORY — PX: ESOPHAGOGASTRODUODENOSCOPY (EGD) WITH PROPOFOL: SHX5813

## 2021-02-02 LAB — CBC
HCT: 30.5 % — ABNORMAL LOW (ref 39.0–52.0)
Hemoglobin: 10.2 g/dL — ABNORMAL LOW (ref 13.0–17.0)
MCH: 29.6 pg (ref 26.0–34.0)
MCHC: 33.4 g/dL (ref 30.0–36.0)
MCV: 88.4 fL (ref 80.0–100.0)
Platelets: 209 10*3/uL (ref 150–400)
RBC: 3.45 MIL/uL — ABNORMAL LOW (ref 4.22–5.81)
RDW: 13.6 % (ref 11.5–15.5)
WBC: 13.9 10*3/uL — ABNORMAL HIGH (ref 4.0–10.5)
nRBC: 0 % (ref 0.0–0.2)

## 2021-02-02 LAB — PROTEIN ELECTRO, RANDOM URINE
Albumin ELP, Urine: 100 %
Alpha-1-Globulin, U: 0 %
Alpha-2-Globulin, U: 0 %
Beta Globulin, U: 0 %
Gamma Globulin, U: 0 %
Total Protein, Urine: 6.6 mg/dL

## 2021-02-02 LAB — BASIC METABOLIC PANEL
Anion gap: 10 (ref 5–15)
BUN: 63 mg/dL — ABNORMAL HIGH (ref 8–23)
CO2: 23 mmol/L (ref 22–32)
Calcium: 8.7 mg/dL — ABNORMAL LOW (ref 8.9–10.3)
Chloride: 106 mmol/L (ref 98–111)
Creatinine, Ser: 2.36 mg/dL — ABNORMAL HIGH (ref 0.61–1.24)
GFR, Estimated: 29 mL/min — ABNORMAL LOW (ref >60)
Glucose, Bld: 112 mg/dL — ABNORMAL HIGH (ref 70–99)
Potassium: 4.1 mmol/L (ref 3.5–5.1)
Sodium: 139 mmol/L (ref 135–145)

## 2021-02-02 LAB — KOH PREP: Special Requests: NORMAL

## 2021-02-02 LAB — KAPPA/LAMBDA LIGHT CHAINS
Kappa free light chain: 28.5 mg/L — ABNORMAL HIGH (ref 3.3–19.4)
Kappa, lambda light chain ratio: 1.72 — ABNORMAL HIGH (ref 0.26–1.65)
Lambda free light chains: 16.6 mg/L (ref 5.7–26.3)

## 2021-02-02 LAB — HEPARIN LEVEL (UNFRACTIONATED): Heparin Unfractionated: 0.73 IU/mL — ABNORMAL HIGH (ref 0.30–0.70)

## 2021-02-02 SURGERY — ESOPHAGOGASTRODUODENOSCOPY (EGD) WITH PROPOFOL
Anesthesia: General

## 2021-02-02 MED ORDER — PROPOFOL 500 MG/50ML IV EMUL
INTRAVENOUS | Status: DC | PRN
  Administered 2021-02-02: 140 ug/kg/min via INTRAVENOUS

## 2021-02-02 MED ORDER — RIVAROXABAN 15 MG PO TABS
15.0000 mg | ORAL_TABLET | Freq: Every day | ORAL | Status: DC
  Administered 2021-02-03 – 2021-02-06 (×4): 15 mg via ORAL
  Filled 2021-02-02 (×4): qty 1

## 2021-02-02 MED ORDER — LIDOCAINE HCL (CARDIAC) PF 100 MG/5ML IV SOSY
PREFILLED_SYRINGE | INTRAVENOUS | Status: DC | PRN
  Administered 2021-02-02: 50 mg via INTRAVENOUS

## 2021-02-02 MED ORDER — SODIUM CHLORIDE 0.9 % IV SOLN: INTRAVENOUS | Status: DC | PRN

## 2021-02-02 MED ORDER — PEG 3350-KCL-NA BICARB-NACL 420 G PO SOLR
4000.0000 mL | Freq: Once | ORAL | Status: AC
  Administered 2021-02-02: 4000 mL via ORAL
  Filled 2021-02-02: qty 4000

## 2021-02-02 MED ORDER — PROPOFOL 10 MG/ML IV BOLUS
INTRAVENOUS | Status: DC | PRN
  Administered 2021-02-02: 70 mg via INTRAVENOUS

## 2021-02-02 MED ORDER — HYDRALAZINE HCL 10 MG PO TABS
10.0000 mg | ORAL_TABLET | Freq: Three times a day (TID) | ORAL | Status: DC
  Administered 2021-02-02 – 2021-02-03 (×2): 10 mg via ORAL
  Filled 2021-02-02 (×4): qty 1

## 2021-02-02 NOTE — Progress Notes (Signed)
Esophageal Brushing for KOH sent to lab.

## 2021-02-02 NOTE — Progress Notes (Signed)
[facility]  ROUNDING NOTE   Subjective:    Scheduled for endoscopy later today.   UOP 2060m.   Objective:  Vital signs in last 24 hours:  Temp:  [97.4 F (36.3 C)-98.1 F (36.7 C)] 98.1 F (36.7 C) (11/18 1122) Pulse Rate:  [55-70] 55 (11/18 1122) Resp:  [18-20] 19 (11/18 1122) BP: (152-165)/(74-89) 152/76 (11/18 1122) SpO2:  [95 %-99 %] 96 % (11/18 1122) Weight:  [85.2 kg] 85.2 kg (11/18 0538)  Weight change: -0.938 kg Filed Weights   01/31/21 1007 02/01/21 0257 02/02/21 0538  Weight: 86.1 kg 87 kg 85.2 kg    Intake/Output: I/O last 3 completed shifts: In: 1164.3 [P.O.:720; I.V.:444.3] Out: 2690 [Urine:2690]   Intake/Output this shift:  Total I/O In: -  Out: 700 [Urine:700]  Physical Exam: General: NAD, sitting up in bed  Head: Normocephalic, atraumatic. Moist oral mucosal membranes  Eyes: Anicteric, PERRL  Neck: Supple, trachea midline  Lungs:  Clear to auscultation, on room air  Heart: Regular rate and rhythm  Abdomen:  Soft, nontender,   Extremities:  no peripheral edema.  Neurologic: Nonfocal, moving all four extremities  Skin: No lesions        Basic Metabolic Panel: Recent Labs  Lab 01/29/21 1700 01/30/21 0410 01/31/21 0434 02/01/21 0442 02/02/21 0548  NA 139 138 139 137 139  K 4.2 4.6 4.6 4.7 4.1  CL 108 109 107 103 106  CO2 21* 20* _0 GLUCOSE 104* 166* 128* 123* 112*  BUN 30* 34* 47* 50* 63*  CREATININE 2.23* 2.36* 2.46* 2.17* 2.36*  CALCIUM 9.1 8.8* 8.9 8.6* 8.7*  MG 2.0  --  2.1  --   --   PHOS  --   --  4.4  --   --      Liver Function Tests: Recent Labs  Lab 01/29/21 1700 01/31/21 0434  AST 29 39  ALT 25 24  ALKPHOS 85 69  BILITOT 1.7* 1.0  PROT 7.9 7.2  ALBUMIN 4.6 4.0    No results for input(s): LIPASE, AMYLASE in the last 168 hours. No results for input(s): AMMONIA in the last 168 hours.  CBC: Recent Labs  Lab 01/29/21 1700 01/30/21 0410 01/30/21 0744 01/30/21 1459 01/31/21 0434  02/01/21 0442 02/02/21 0548  WBC 8.9 4.1  --   --  15.1* 16.6* 13.9*  NEUTROABS 7.3  --   --   --   --   --   --   HGB 10.9* 9.9* 10.0* 9.6* 10.2* 9.7* 10.2*  HCT 33.1* 30.3* 29.8* 28.6* 30.7* 29.5* 30.5*  MCV 88.3 89.4  --   --  89.0 88.1 88.4  PLT 218 188  --   --  222 220 209     Cardiac Enzymes: No results for input(s): CKTOTAL, CKMB, CKMBINDEX, TROPONINI in the last 168 hours.  BNP: Invalid input(s): POCBNP  CBG: No results for input(s): GLUCAP in the last 168 hours.  Microbiology: Results for orders placed or performed during the hospital encounter of 01/29/21  Resp Panel by RT-PCR (Flu A&B, Covid) Nasopharyngeal Swab     Status: None   Collection Time: 01/29/21  9:17 PM   Specimen: Nasopharyngeal Swab; Nasopharyngeal(NP) swabs in vial transport medium  Result Value Ref Range Status   SARS Coronavirus 2 by RT PCR NEGATIVE NEGATIVE Final    Comment: (NOTE) SARS-CoV-2 target nucleic acids are NOT DETECTED.  The SARS-CoV-2 RNA is generally detectable in upper respiratory specimens during the acute phase of infection.  The lowest concentration of SARS-CoV-2 viral copies this assay can detect is 138 copies/mL. A negative result does not preclude SARS-Cov-2 infection and should not be used as the sole basis for treatment or other patient management decisions. A negative result may occur with  improper specimen collection/handling, submission of specimen other than nasopharyngeal swab, presence of viral mutation(s) within the areas targeted by this assay, and inadequate number of viral copies(<138 copies/mL). A negative result must be combined with clinical observations, patient history, and epidemiological information. The expected result is Negative.  Fact Sheet for Patients:  EntrepreneurPulse.com.au  Fact Sheet for Healthcare Providers:  IncredibleEmployment.be  This test is no t yet approved or cleared by the [place] FDA  and  has been authorized for detection and/or diagnosis of SARS-CoV-2 by FDA under an Emergency Use Authorization (EUA). This EUA will remain  in effect (meaning this test can be used) for the duration of the COVID-19 declaration under Section 564(b)(1) of the Act, 21 U.S.C.section 360bbb-3(b)(1), unless the authorization is terminated  or revoked sooner.       Influenza A by PCR NEGATIVE NEGATIVE Final   Influenza B by PCR NEGATIVE NEGATIVE Final    Comment: (NOTE) The Xpert Xpress SARS-CoV-2/FLU/RSV plus assay is intended as an aid in the diagnosis of influenza from Nasopharyngeal swab specimens and should not be used as a sole basis for treatment. Nasal washings and aspirates are unacceptable for Xpert Xpress SARS-CoV-2/FLU/RSV testing.  Fact Sheet for Patients: EntrepreneurPulse.com.au  Fact Sheet for Healthcare Providers: IncredibleEmployment.be  This test is not yet approved or cleared by the [place] FDA and has been authorized for detection and/or diagnosis of SARS-CoV-2 by FDA under an Emergency Use Authorization (EUA). This EUA will remain in effect (meaning this test can be used) for the duration of the COVID-19 declaration under Section 564(b)(1) of the Act, 21 U.S.C. section 360bbb-3(b)(1), unless the authorization is terminated or revoked.  Performed at [facility], [address] [place], [place] [address]     Coagulation Studies: No results for input(s): LABPROT, INR in the last 72 hours.   Urinalysis: Recent Labs    01/30/21 1540  COLORURINE STRAW*  LABSPEC 1.005  PHURINE 5.0  GLUCOSEU NEGATIVE  HGBUR SMALL*  BILIRUBINUR NEGATIVE  KETONESUR NEGATIVE  PROTEINUR NEGATIVE  NITRITE NEGATIVE  LEUKOCYTESUR NEGATIVE       Imaging: US RENAL  Result Date: 02/01/2021 CLINICAL DATA:  Acute kidney failure.  Hematuria EXAM: RENAL / URINARY TRACT ULTRASOUND COMPLETE COMPARISON:  CT abdomen pelvis  10/15/2019 FINDINGS: Right Kidney: Renal measurements: 7.0 x 4.1 x 3.4 cm = volume: 50 mL. Echogenicity within normal limits. No mass or hydronephrosis visualized. Left Kidney: Renal measurements: 10.8 x 5.1 x 4.7 cm = volume: 134 mL. Small arterial calcifications noted on CT. Echogenicity within normal limits. No mass or hydronephrosis visualized. Bladder: Appears normal for degree of bladder distention. Other: Moderately large pleural effusions bilaterally with bibasilar atelectasis IMPRESSION: Negative for renal obstruction.  Atrophic right kidney Moderately large bilateral pleural effusions. Electronically Signed   By: Franchot Gallo M.D.   On: 02/01/2021 10:00     Medications:      amLODipine  5 mg Oral Daily   aspirin EC  81 mg Oral Daily   dextromethorphan-guaiFENesin  1 tablet Oral BID   metoprolol tartrate  12.5 mg Oral BID   nicotine  14 mg Transdermal Daily   pantoprazole (PROTONIX) IV  40 mg Intravenous Q24H   predniSONE  40 mg Oral  Q breakfast   rosuvastatin  20 mg Oral Daily   sodium chloride flush  3 mL Intravenous Q12H   torsemide  20 mg Oral Daily   acetaminophen **OR** acetaminophen, albuterol, hydrALAZINE, morphine injection, ondansetron **OR** ondansetron (ZOFRAN) IV  Assessment/ Plan:  Mr. Darryle Mccoy. is a 70 y.o. white male with COPD, hypertension, peripheral vascular disease, CVA, CO exposure, peptic ulcer disease, who was admitted to [facility] on 01/29/2021 for Respiratory distress [R06.03] Elevated troponin [R77.8] Acute respiratory failure with hypoxia (HCC) [B33.83] Acute systolic (congestive) heart failure (HCC) [I50.21] Acute on chronic congestive heart failure, unspecified heart failure type (HCC) [I50.9]   Acute kidney injury on chronic kidney disease stage IIIB: baseline creatinine of 1.8, GFR of 38. Chronic kidney disease seems to be secondary to NSAID (years of using meloxicam and celecoxib). No IV contrast exposure. Renal ultrasound without  obstruction but with atrophic right kidney - Pending serologic work up - Will eventually need to restart ARB in the future.    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 - Continue fluid restriction   Hypertension: 152/76 - not at goal. Driven by COPD and systemic steroids. Prior to admission, home regimen included amlodipine 512m olmesartan and hydrochlorothiazide - restarted amlodipine.  - anticipate restarting olmesartan    Hematuria: patient has had hematuria on urinalysis in 2019. Ultrasound with no cause. Recommend urology referral as outpatient.  - pending serologies.    LOS: 4 Dechelle At[place]way 11/18/202212:40 PM

## 2021-02-02 NOTE — Op Note (Signed)
[facility] Patient Name: Gary Mccoy Procedure Date: 02/02/2021 3:09 PM MRN: [id number] Account #: [id number] Date of Birth: October 17, 1950 Admit Type: Outpatient Age: 70 Room: [facility] ENDO ROOM 3 Gender: Male Note Status: Finalized Instrument Name: Upper Endoscope [id number] Procedure:             Upper GI endoscopy Indications:           Melena Providers:             Andrey Farmer MD, MD Referring MD:          Rusty Aus, MD (Referring MD) Medicines:             Monitored Anesthesia Care Complications:         No immediate complications. Procedure:             Pre-Anesthesia Assessment:                        - Prior to the procedure, a History and Physical was                         performed, and patient medications and allergies were                         reviewed. The patient is competent. The risks and                         benefits of the procedure and the sedation options and                         risks were discussed with the patient. All questions                         were answered and informed consent was obtained.                         Patient identification and proposed procedure were                         verified by the physician, the nurse, the anesthetist                         and the technician in the endoscopy suite. Mental                         Status Examination: alert and oriented. Airway                         Examination: normal oropharyngeal airway and neck                         mobility. Respiratory Examination: clear to                         auscultation. CV Examination: normal. Prophylactic                         Antibiotics: The patient does not require prophylactic  antibiotics. Prior Anticoagulants: The patient has                         taken heparin, last dose was day of procedure. ASA                         Grade Assessment: IV - A patient with severe systemic                          disease that is a constant threat to life. After                         reviewing the risks and benefits, the patient was                         deemed in satisfactory condition to undergo the                         procedure. The anesthesia plan was to use monitored                         anesthesia care (MAC). Immediately prior to                         administration of medications, the patient was                         re-assessed for adequacy to receive sedatives. The                         heart rate, respiratory rate, oxygen saturations,                         blood pressure, adequacy of pulmonary ventilation, and                         response to care were monitored throughout the                         procedure. The physical status of the patient was                         re-assessed after the procedure.                        After obtaining informed consent, the endoscope was                         passed under direct vision. Throughout the procedure,                         the patient's blood pressure, pulse, and oxygen                         saturations were monitored continuously. The Endoscope                         was introduced through the mouth, and advanced to the  second part of duodenum. The upper GI endoscopy was                         accomplished without difficulty. The patient tolerated                         the procedure well. Findings:      White nummular lesions were noted in the entire esophagus. Brushings for       KOH prep were obtained. This could also be due to medications he took       this morning.      The entire examined stomach was normal.      The examined duodenum was normal. Impression:            - White nummular lesions in esophageal mucosa.                         Brushings performed.                        - Normal stomach.                        - Normal examined  duodenum. Recommendation:        - Perform a colonoscopy today. Procedure Code(s):     --- Professional ---                        [id number], Esophagogastroduodenoscopy, flexible,                         transoral; diagnostic, including collection of                         specimen(s) by brushing or washing, when performed                         (separate procedure) Diagnosis Code(s):     --- Professional ---                        K22.8, Other specified diseases of esophagus                        K92.1, Melena (includes Hematochezia) CPT copyright 2019 [employer]. All rights reserved. The codes documented in this report are preliminary and upon coder review may  be revised to meet current compliance requirements. Andrey Farmer MD, MD 02/02/2021 3:47:58 PM Number of Addenda: 0 Note Initiated On: 02/02/2021 3:09 PM Estimated Blood Loss:  Estimated blood loss: none.      [facility]

## 2021-02-02 NOTE — Progress Notes (Addendum)
PROGRESS NOTE    Gary Mccoy.   WPV:[id number]  DOB: March 03, 1951  PCP: Rusty Aus, MD    DOA: 01/29/2021 LOS: 4    Brief Narrative / Hospital Course to Date:   70 y.o. male with past medical history significant of CVA, peripheral artery disease, placed on Xarelto by vascular surgery since 2017, chronic combined diastolic and systolic CHF, nonbleeding internal hemorrhoids, diverticulosis noted colonoscopy in 2019, gastritis, nonbleeding duodenal ulcer seen on EGD done in 2017, hyperlipidemia, essential hypertension, GERD who presented to [facility] ED on 01/29/21 with complaints of progressively worsening shortness of breath of 1 week duration, worse with exertion.  Also reported intermittent loose black stools for the past 6 weeks.  Denies use of NSAIDs.  Work-up in the ED revealed acute hypoxic respiratory failure secondary to acute CHF with concern for pulmonary edema.  Elevated troponin, was started on heparin drip and his home Xarelto was held.  Labs also revealed iron deficiency anemia.   Admitted to [facility] service.  Cardiology and GI consulted.  Assessment & Plan   Principal Problem:   Shortness of breath Active Problems:   Benign essential HTN   Duodenal ulcer disease   Tobacco abuse counseling   Peripheral vascular disease ([place])   Hyperlipidemia   CVA (cerebral vascular accident) ([place])   Acute respiratory failure with hypoxia (HCC)   Melena   Anemia   Acute systolic (congestive) heart failure (HCC)   Acute on chronic combined diastolic and systolic CHF - Presented with progressive dyspnea with minimal exertion, elevated BNP >1000, pulmonary edema seen on chest x-ray. Last Echo in 2019 revealed LVEF 45 to 50% with grade 1 diastolic dysfunction. Echo on 01/30/2021 showed LVEF 25 to 30%, left ventricle shows global hypokinesis, grade 2 diastolic dysfunction. --Cardiology consulted --Diuretic transitioned to torsemide 20 mg daily per nephrology --Started on Lopressor  12.5 mg p.o. twice daily --Continue aspirin and statin --Strict I/O's and daily weights --Monitor renal function and electrolytes closely with diuresis --Needs ischemic evaluation, likely as outpatient.  Cardiac cath precluded by renal failure.  Acute exacerbation of suspected COPD versus bronchitis - Improving.  Off oxygen. Patient is a 50-year smoker and has poor aeration and diffuse expiratory wheezing on exam consistent with exacerbated obstructive pulmonary disease.  Noted to have a productive sounding cough. --Continue prednisone 40 mg daily --Scheduled Mucinex --As needed albuterol  --Recommend formal PFTs as outpatient --Supplement oxygen as needed to maintain sats above 90% -wean as tolerated   Elevated troponin due to suspect demand ischemia -likely secondary to hypoxia from pulmonary edema.  Troponin on presentation 126, peaked at 145 and downtrending. No acute ischemic changes on EKG.  No active chest pain --Xarelto held on admission --On heparin drip --Telemetry --Cardiology following   AKI on CKD 3B - likely prerenal azotemia due to dehydration from poor p.o. intake Baseline creatinine appears to be 1.6.  Presented with creatinine 2.36. --Nephrology consulted given need for diuresis --Avoid nephrotoxic agents and hypotension --Monitor renal function closely  --Monitor urine output --Resume ARB eventually --Follow-up pending SPEP/UPEP and hepatitis panel   Suspected upper GI bleed (clinically undetermined if due to [facility]) / Hx of intermittent melena Iron deficiency anemia History of gastritis/nonbleeding duodenal ulcer seen on EGD in 2017. History of nonbleeding internal hemorrhoids / diverticulosis seen on colonoscopy done in 2019. Iron deficiency anemia likely secondary to chronic blood loss. Hemoglobin on admission 9.9 --Trend hemoglobin, transfuse if less than 8.0 --Continue IV PPI --On heparin, monitor closely for recurrent  bleeding --GI consulted, planning  endoscopy - cleared by cardiology --Clear liquid diet and n.p.o. after midnight in case EGD tomorrow   Acute hypoxic respiratory failure -Resolved. Weaned to room air on afternoon of 11/17. Likely due to CHF and COPD exacerbation. Chest x-ray on admission which shows increasing pulmonary vascularity, bilateral pleural effusions. Requiring 4 liters per minute supplemental oxygen on admission. Not on oxygen supplementation at baseline. -- Management of CHF and COPD as above --Supplemental oxygen to maintain sats above 90%, wean as tolerated --Incentive spirometry   Sinus tachycardia -present on admission.   TSH mildly low at 0.230, normal free T4 at 0.86. --Treat underlying conditions as above --Telemetry --Metoprolol started on admission   Hyperlipidemia -on Crestor Fasting lipid panel: Cholesterol 129, HDL low 38, LDL 73, TGs 91    Patient BMI: Body mass index is 25.47 kg/m.   DVT prophylaxis:    Diet:  Diet Orders (From admission, onward)     Start     Ordered   02/02/21 0001  Diet NPO time specified  Diet effective midnight        02/01/21 1517              Code Status: DNR   Subjective 02/02/21    Patient seen before going to endoscopy today.  Weaned off O2 yesterday afternoon and reports he's been doing fine.  Still more SOB than usual, but overall improved.  Says stool is liquid and yellow, drank as much prep as he could. No other acute complaints.    Disposition Plan & Communication   Status is: Inpatient  Remains inpatient appropriate because: On IV therapies as above and pending GI evaluation once respiratory status is improved    Family Communication: Wife at bedside on rounds today 11/18   Consults, Procedures, Significant Events   Consultants:  Cardiology Gastroenterology Nephrology  Procedures:  None  Antimicrobials:  Anti-infectives (From admission, onward)    None         Micro    Objective   Vitals:   02/02/21 1459  02/02/21 1547 02/02/21 1557 02/02/21 1607  BP: (!) 163/75 (!) 145/72 (!) 150/77 (!) 173/73  Pulse: (!) 57 60 61 (!) 58  Resp: _0 Temp: (!) 97 F (36.1 C) 98.2 F (36.8 C)    TempSrc: Temporal Temporal    SpO2: 97% 93% 96% 97%  Weight:      Height:        Intake/Output Summary (Last 24 hours) at 02/02/2021 1622 Last data filed at 02/02/2021 1328 Gross per 24 hour  Intake 834.22 ml  Output 2040 ml  Net -1205.78 ml   Filed Weights   01/31/21 1007 02/01/21 0257 02/02/21 0538  Weight: 86.1 kg 87 kg 85.2 kg    Physical Exam:  General exam: awake, alert, no acute distress Respiratory system: improved air movement but expiratory minimal scattered wheezes, normal respiratory effort, on room air. Cardiovascular system: normal S1/S2, RRR, no peripheral edema Central nervous system: A&O x4. no gross focal neurologic deficits, normal speech Psychiatry: normal mood, congruent affect, judgement and insight appear normal  Labs   Data Reviewed: I have personally reviewed following labs and imaging studies  CBC: Recent Labs  Lab 01/29/21 1700 01/30/21 0410 01/30/21 0744 01/30/21 1459 01/31/21 0434 02/01/21 0442 02/02/21 0548  WBC 8.9 4.1  --   --  15.1* 16.6* 13.9*  NEUTROABS 7.3  --   --   --   --   --   --  HGB 10.9* 9.9* 10.0* 9.6* 10.2* 9.7* 10.2*  HCT 33.1* 30.3* 29.8* 28.6* 30.7* 29.5* 30.5*  MCV 88.3 89.4  --   --  89.0 88.1 88.4  PLT 218 188  --   --  222 220 259   Basic Metabolic Panel: Recent Labs  Lab 01/29/21 1700 01/30/21 0410 01/31/21 0434 02/01/21 0442 02/02/21 0548  NA 139 138 139 137 139  K 4.2 4.6 4.6 4.7 4.1  CL 108 109 107 103 106  CO2 21* 20* _0 GLUCOSE 104* 166* 128* 123* 112*  BUN 30* 34* 47* 50* 63*  CREATININE 2.23* 2.36* 2.46* 2.17* 2.36*  CALCIUM 9.1 8.8* 8.9 8.6* 8.7*  MG 2.0  --  2.1  --   --   PHOS  --   --  4.4  --   --    GFR: Estimated Creatinine Clearance: 32.4 mL/min (A) (by C-G formula based on SCr of 2.36  mg/dL (H)). Liver Function Tests: Recent Labs  Lab 01/29/21 1700 01/31/21 0434  AST 29 39  ALT 25 24  ALKPHOS 85 69  BILITOT 1.7* 1.0  PROT 7.9 7.2  ALBUMIN 4.6 4.0   No results for input(s): LIPASE, AMYLASE in the last 168 hours. No results for input(s): AMMONIA in the last 168 hours. Coagulation Profile: Recent Labs  Lab 01/29/21 1700  INR 1.9*   Cardiac Enzymes: No results for input(s): CKTOTAL, CKMB, CKMBINDEX, TROPONINI in the last 168 hours. BNP (last 3 results) No results for input(s): PROBNP in the last 8760 hours. HbA1C: No results for input(s): HGBA1C in the last 72 hours.  CBG: No results for input(s): GLUCAP in the last 168 hours. Lipid Profile: Recent Labs    01/31/21 0434  CHOL 129  HDL 38*  LDLCALC 73  TRIG 91  CHOLHDL 3.4   Thyroid Function Tests: Recent Labs    01/31/21 0434  TSH 0.230*  FREET4 0.86   Anemia Panel: No results for input(s): VITAMINB12, FOLATE, FERRITIN, TIBC, IRON, RETICCTPCT in the last 72 hours.  Sepsis Labs: No results for input(s): PROCALCITON, LATICACIDVEN in the last 168 hours.  Recent Results (from the past 240 hour(s))  Resp Panel by RT-PCR (Flu A&B, Covid) Nasopharyngeal Swab     Status: None   Collection Time: 01/29/21  9:17 PM   Specimen: Nasopharyngeal Swab; Nasopharyngeal(NP) swabs in vial transport medium  Result Value Ref Range Status   SARS Coronavirus 2 by RT PCR NEGATIVE NEGATIVE Final    Comment: (NOTE) SARS-CoV-2 target nucleic acids are NOT DETECTED.  The SARS-CoV-2 RNA is generally detectable in upper respiratory specimens during the acute phase of infection. The lowest concentration of SARS-CoV-2 viral copies this assay can detect is 138 copies/mL. A negative result does not preclude SARS-Cov-2 infection and should not be used as the sole basis for treatment or other patient management decisions. A negative result may occur with  improper specimen collection/handling, submission of specimen  other than nasopharyngeal swab, presence of viral mutation(s) within the areas targeted by this assay, and inadequate number of viral copies(<138 copies/mL). A negative result must be combined with clinical observations, patient history, and epidemiological information. The expected result is Negative.  Fact Sheet for Patients:  EntrepreneurPulse.com.au  Fact Sheet for Healthcare Providers:  IncredibleEmployment.be  This test is no t yet approved or cleared by the [place] FDA and  has been authorized for detection and/or diagnosis of SARS-CoV-2 by FDA under an Emergency Use Authorization (EUA). This EUA will remain  in effect (meaning this test can be used) for the duration of the COVID-19 declaration under Section 564(b)(1) of the Act, 21 U.S.C.section 360bbb-3(b)(1), unless the authorization is terminated  or revoked sooner.       Influenza A by PCR NEGATIVE NEGATIVE Final   Influenza B by PCR NEGATIVE NEGATIVE Final    Comment: (NOTE) The Xpert Xpress SARS-CoV-2/FLU/RSV plus assay is intended as an aid in the diagnosis of influenza from Nasopharyngeal swab specimens and should not be used as a sole basis for treatment. Nasal washings and aspirates are unacceptable for Xpert Xpress SARS-CoV-2/FLU/RSV testing.  Fact Sheet for Patients: EntrepreneurPulse.com.au  Fact Sheet for Healthcare Providers: IncredibleEmployment.be  This test is not yet approved or cleared by the [place] FDA and has been authorized for detection and/or diagnosis of SARS-CoV-2 by FDA under an Emergency Use Authorization (EUA). This EUA will remain in effect (meaning this test can be used) for the duration of the COVID-19 declaration under Section 564(b)(1) of the Act, 21 U.S.C. section 360bbb-3(b)(1), unless the authorization is terminated or revoked.  Performed at [facility], [address]  [place], [place] [address]   KOH prep     Status: None   Collection Time: 02/02/21  3:18 PM   Specimen: Esophagus  Result Value Ref Range Status   Specimen Description ESOPHAGUS  Final   Special Requests Normal  Final   KOH Prep   Final    YEAST WITH PSEUDOHYPHAE Performed at [facility], [address], [place], [place] [address]    Report Status 02/02/2021 FINAL  Final      Imaging Studies   US RENAL  Result Date: Feb 19, 2021 CLINICAL DATA:  Acute kidney failure.  Hematuria EXAM: RENAL / URINARY TRACT ULTRASOUND COMPLETE COMPARISON:  CT abdomen pelvis 10/15/2019 FINDINGS: Right Kidney: Renal measurements: 7.0 x 4.1 x 3.4 cm = volume: 50 mL. Echogenicity within normal limits. No mass or hydronephrosis visualized. Left Kidney: Renal measurements: 10.8 x 5.1 x 4.7 cm = volume: 134 mL. Small arterial calcifications noted on CT. Echogenicity within normal limits. No mass or hydronephrosis visualized. Bladder: Appears normal for degree of bladder distention. Other: Moderately large pleural effusions bilaterally with bibasilar atelectasis IMPRESSION: Negative for renal obstruction.  Atrophic right kidney Moderately large bilateral pleural effusions. Electronically Signed   By: Franchot Gallo M.D.   On: February 19, 2021 10:00     Medications   Scheduled Meds:  [MAR Hold] amLODipine  5 mg Oral Daily   [MAR Hold] aspirin EC  81 mg Oral Daily   [MAR Hold] dextromethorphan-guaiFENesin  1 tablet Oral BID   [MAR Hold] metoprolol tartrate  12.5 mg Oral BID   [MAR Hold] nicotine  14 mg Transdermal Daily   [MAR Hold] pantoprazole (PROTONIX) IV  40 mg Intravenous Q24H   [MAR Hold] predniSONE  40 mg Oral Q breakfast   [MAR Hold] rosuvastatin  20 mg Oral Daily   [MAR Hold] sodium chloride flush  3 mL Intravenous Q12H   [MAR Hold] torsemide  20 mg Oral Daily   Continuous Infusions:       LOS: 4 days    Time spent: 25 minutes with > 50% spent at bedside and in coordination of  care     Ezekiel Slocumb, DO [facility]  02/02/2021, 4:22 PM      If 7PM-7AM, please contact night-coverage. How to contact the [facility] Attending or Consulting provider [place] or covering provider during after hours [place], for this  patient?    Check the care team in [facility] and look for a) attending/consulting [facility] provider listed and b) the [facility] team listed Log into [email or web address] and use 's universal password to access. If you do not have the password, please contact the hospital operator. Locate the [facility] provider you are looking for under [facility] and page to a number that you can be directly reached. If you still have difficulty reaching the provider, please page the [facility] (Director on Call) for the Hospitalists listed on amion for assistance.

## 2021-02-02 NOTE — Care Plan (Signed)
EGD/Colonoscopy unremarkable. Biopsies taken to rule out any microscopic causes of diarrhea. Ok to resume heparin drip this evening if needed. Can continue with cardiac work-up. Given no further GI work-up required, we will sign-off, please call with any questions or concerns.  Raylene Miyamoto MD, MPH [place]

## 2021-02-02 NOTE — Op Note (Signed)
[facility] Gastroenterology Patient Name: Gary Mccoy Procedure Date: 02/02/2021 3:08 PM MRN: [id number] Account #: [id number] Date of Birth: [birth year] Admit Type: Inpatient Age: 70 Room: [facility] ENDO ROOM 3 Gender: Male Note Status: Finalized Instrument Name: Jasper Riling [id number] Procedure:             Colonoscopy Indications:           Melena Providers:             Andrey Farmer MD, MD Referring MD:          Rusty Aus, MD (Referring MD) Medicines:             Monitored Anesthesia Care Complications:         No immediate complications. Estimated blood loss:                         Minimal. Procedure:             Pre-Anesthesia Assessment:                        - Prior to the procedure, a History and Physical was                         performed, and patient medications and allergies were                         reviewed. The patient is competent. The risks and                         benefits of the procedure and the sedation options and                         risks were discussed with the patient. All questions                         were answered and informed consent was obtained.                         Patient identification and proposed procedure were                         verified by the physician, the nurse, the anesthetist                         and the technician in the endoscopy suite. Mental                         Status Examination: alert and oriented. Airway                         Examination: normal oropharyngeal airway and neck                         mobility. Respiratory Examination: clear to                         auscultation. CV Examination: normal. Prophylactic  Antibiotics: The patient does not require prophylactic                         antibiotics. Prior Anticoagulants: The patient has                         taken heparin, last dose was day of procedure. ASA                         Grade  Assessment: IV - A patient with severe systemic                         disease that is a constant threat to life. After                         reviewing the risks and benefits, the patient was                         deemed in satisfactory condition to undergo the                         procedure. The anesthesia plan was to use monitored                         anesthesia care (MAC). Immediately prior to                         administration of medications, the patient was                         re-assessed for adequacy to receive sedatives. The                         heart rate, respiratory rate, oxygen saturations,                         blood pressure, adequacy of pulmonary ventilation, and                         response to care were monitored throughout the                         procedure. The physical status of the patient was                         re-assessed after the procedure.                        After obtaining informed consent, the colonoscope was                         passed under direct vision. Throughout the procedure,                         the patient's blood pressure, pulse, and oxygen                         saturations were monitored continuously. The  Colonoscope was introduced through the anus and                         advanced to the the terminal ileum. The colonoscopy                         was performed without difficulty. The patient                         tolerated the procedure well. The quality of the bowel                         preparation was adequate to identify polyps. Findings:      The perianal and digital rectal examinations were normal.      The terminal ileum appeared normal.      A localized area of granular mucosa was found in the rectum. Biopsies       were taken with a cold forceps for histology. Estimated blood loss was       minimal.      Biopsies for histology were taken with a cold forceps from  the entire       colon for evaluation of microscopic colitis.      Internal hemorrhoids were found during retroflexion. The hemorrhoids       were Grade I (internal hemorrhoids that do not prolapse).      The exam was otherwise without abnormality on direct and retroflexion       views. Impression:            - The examined portion of the ileum was normal.                        - Granularity in the rectum. Biopsied.                        - Internal hemorrhoids.                        - The examination was otherwise normal on direct and                         retroflexion views.                        - Biopsies were taken with a cold forceps from the                         entire colon for evaluation of microscopic colitis. Recommendation:        - Return patient to hospital ward for ongoing care.                        - Advance diet as tolerated.                        - Continue present medications.                        - Await pathology results.                        - Repeat  colonoscopy in 5 years for surveillance.                        - Given no overt GI bleeding and stable hemoglobin on                         gtt, no further GI work-up indicated. Can restart                         heparin gtt tonight at 7 o'clock. Management of his                         cardio/pulmonary issues per primary team. Procedure Code(s):     --- Professional ---                        [phone], Colonoscopy, flexible; with biopsy, single or                         multiple Diagnosis Code(s):     --- Professional ---                        K64.0, First degree hemorrhoids                        K62.89, Other specified diseases of anus and rectum                        K92.1, Melena (includes Hematochezia) CPT copyright 2019 American Medical Association. All rights reserved. The codes documented in this report are preliminary and upon coder review may  be revised to meet current compliance  requirements. Andrey Farmer MD, MD 02/02/2021 3:54:39 PM Number of Addenda: 0 Note Initiated On: 02/02/2021 3:08 PM Scope Withdrawal Time: 0 hours 17 minutes 27 seconds  Estimated Blood Loss:  Estimated blood loss was minimal.      [place]

## 2021-02-02 NOTE — Progress Notes (Signed)
[facility]    SUBJECTIVE:   - Feels much better today. Breathing improved. Minimal LE edema.  - EGD/Colonoscopy showed no significant bleeding.    Vitals:   02/02/21 0436 02/02/21 0538 02/02/21 0832 02/02/21 1122  BP: (!) 154/78  (!) 154/89 (!) 152/76  Pulse: 64  70 (!) 55  Resp: _0 Temp: 97.8 F (36.6 C)  (!) 97.4 F (36.3 C) 98.1 F (36.7 C)  TempSrc:      SpO2: 99%  97% 96%  Weight:  85.2 kg    Height:         Intake/Output Summary (Last 24 hours) at 02/02/2021 1330 Last data filed at 02/02/2021 1328 Gross per 24 hour  Intake 1074.22 ml  Output 3140 ml  Net -2065.78 ml       PHYSICAL EXAM  General: Well developed, well nourished, in no acute distress HEENT:  Normocephalic and atramatic Neck:  No JVD.  Lungs: Diminished breath sounds clear bilaterally to auscultation and percussion. Heart: HRRR . Normal S1 and S2 without gallops or murmurs.  Abdomen: Bowel sounds are positive, abdomen soft and non-tender  Msk:  Back normal, normal gait. Normal strength and tone for age. Extremities: No clubbing, cyanosis or 2+edema.   Neuro: Alert and oriented X 3. Psych:  Good affect, responds appropriately   LABS: Basic Metabolic Panel: Recent Labs    01/31/21 0434 02/01/21 0442 02/02/21 0548  NA 139 137 139  K 4.6 4.7 4.1  CL 107 103 106  CO2 _1 GLUCOSE 128* 123* 112*  BUN 47* 50* 63*  CREATININE 2.46* 2.17* 2.36*  CALCIUM 8.9 8.6* 8.7*  MG 2.1  --   --   PHOS 4.4  --   --     Liver Function Tests: Recent Labs    01/31/21 0434  AST 39  ALT 24  ALKPHOS 69  BILITOT 1.0  PROT 7.2  ALBUMIN 4.0    No results for input(s): LIPASE, AMYLASE in the last 72 hours. CBC: Recent Labs    02/01/21 0442 02/02/21 0548  WBC 16.6* 13.9*  HGB 9.7* 10.2*  HCT 29.5* 30.5*  MCV 88.1 88.4  PLT 220 209    Cardiac Enzymes: No results for input(s): CKTOTAL, CKMB, CKMBINDEX, TROPONINI in the last 72 hours. BNP: Invalid input(s):  POCBNP D-Dimer: No results for input(s): DDIMER in the last 72 hours. Hemoglobin A1C: No results for input(s): HGBA1C in the last 72 hours.  Fasting Lipid Panel: Recent Labs    01/31/21 0434  CHOL 129  HDL 38*  LDLCALC 73  TRIG 91  CHOLHDL 3.4    Thyroid Function Tests: Recent Labs    01/31/21 0434  TSH 0.230*    Anemia Panel: No results for input(s): VITAMINB12, FOLATE, FERRITIN, TIBC, IRON, RETICCTPCT in the last 72 hours.   US RENAL  Result Date: 02/01/2021 CLINICAL DATA:  Acute kidney failure.  Hematuria EXAM: RENAL / URINARY TRACT ULTRASOUND COMPLETE COMPARISON:  CT abdomen pelvis 10/15/2019 FINDINGS: Right Kidney: Renal measurements: 7.0 x 4.1 x 3.4 cm = volume: 50 mL. Echogenicity within normal limits. No mass or hydronephrosis visualized. Left Kidney: Renal measurements: 10.8 x 5.1 x 4.7 cm = volume: 134 mL. Small arterial calcifications noted on CT. Echogenicity within normal limits. No mass or hydronephrosis visualized. Bladder: Appears normal for degree of bladder distention. Other: Moderately large pleural effusions bilaterally with bibasilar atelectasis IMPRESSION: Negative for renal obstruction.  Atrophic right kidney Moderately large bilateral pleural effusions. Electronically  Signed   By: Franchot Gallo M.D.   On: 02/01/2021 10:00     Echo moderate to severely depressed left ventricular function at 25 to 30%  TELEMETRY: Sinus rhythm 85 nonspecific findings:  ASSESSMENT AND PLAN:  Principal Problem:   Shortness of breath Active Problems:   Benign essential HTN   Duodenal ulcer disease   Tobacco abuse counseling   Peripheral vascular disease (HCC)   Hyperlipidemia   CVA (cerebral vascular accident) ([place])   Acute respiratory failure with hypoxia (HCC)   Melena   Anemia   Acute systolic (congestive) heart failure ([place]) Demand ischemia this does not represent a non-STEMI  Plan The office Pastorino is a 70 year old male with history of CVA, PAD (on  Xarelto following vascular surgery), HFrEF (previously 45-50, now 25 to 30%), hypertension, hyperlipidemia who was admitted with shortness of breath.  Cardiology is consulted for elevated troponin in presence of pulmonary edema.  Additionally we are consulted for evaluation of newly reduced ejection fraction.  #Acute systolic heart failure #Acute renal insufficiency #Demand ischemia  EF previously 45 to 50%, now decreased to 25 to 30%.  He was grossly volume overloaded on admission with pulmonary edema and has been improving with diuresis.  Troponin peak was 185.  Patient has renal insufficiency with a baseline creatinine of approximately 1.7-2, and is currently elevated to 2.4. -Continue diuresis with torsemide 20 mg daily -Continue metoprolol 12.5 mg twice daily -Continue aspirin 81 mg daily -Continue Crestor 20 mg daily -We will need addition of afterload reduction.  Given renal insufficiency we will hold on initiating ACE or ARB.  Start hydral 10 mg TID PO now. -We will need ischemic evaluation.  Given his ongoing bleeding issues and renal insufficiency this can likely happen as an outpatient.   [name], MD 02/02/2021 1:30 PM

## 2021-02-02 NOTE — Transfer of Care (Signed)
Immediate Anesthesia Transfer of Care Note  Patient: Gary Mccoy.  Procedure(s) Performed: ESOPHAGOGASTRODUODENOSCOPY (EGD) WITH PROPOFOL COLONOSCOPY WITH PROPOFOL  Patient Location: PACU and Endoscopy Unit  Anesthesia Type:General  Level of Consciousness: drowsy  Airway & Oxygen Therapy: Patient Spontanous Breathing  Post-op Assessment: Report given to RN  Post vital signs: stable  Last Vitals:  Vitals Value Taken Time  BP    Temp    Pulse    Resp    SpO2      Last Pain:  Vitals:   02/02/21 1459  TempSrc: Temporal  PainSc: 0-No pain         Complications: No notable events documented.

## 2021-02-02 NOTE — Anesthesia Preprocedure Evaluation (Signed)
Anesthesia Evaluation  Patient identified by MRN, date of birth, ID band Patient awake    Reviewed: Allergy & Precautions, NPO status , Patient's Chart, lab work & pertinent test results  History of Anesthesia Complications (+) Emergence Delirium and history of anesthetic complications  Airway Mallampati: III  TM Distance: >3 FB Neck ROM: full    Dental  (+) Edentulous Upper, Edentulous Lower   Pulmonary Current Smoker and Patient abstained from smoking.,    Pulmonary exam normal        Cardiovascular hypertension, + Peripheral Vascular Disease and +CHF  Normal cardiovascular exam     Neuro/Psych  Neuromuscular disease CVA negative psych ROS   GI/Hepatic Neg liver ROS, PUD, GERD  Medicated and Controlled,  Endo/Other  negative endocrine ROS  Renal/GU Renal disease  negative genitourinary   Musculoskeletal  (+) Arthritis ,   Abdominal   Peds  Hematology negative hematology ROS (+)   Anesthesia Other Findings Past Medical History: No date: Arthritis     Comment:  lower back No date: Back pain No date: 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 No date: Complication of anesthesia     Comment:  Violent with hallucinations after procedure 10/2015 No date: DDD (degenerative disc disease), lumbar No date: Duodenal ulcer No date: Elevated lipids No date: GERD (gastroesophageal reflux disease) No date: Leg weakness, bilateral     Comment:  s/p lumbar surgery No date: PAD (peripheral artery disease) (HCC) No date: Polyradiculitis No date: Stroke [facility])     Comment:  Rt lower leg  estimated 10 - 12 stokes in last 5 years No date: Wears dentures     Comment:  full upper and lower  Past Surgical History: 11/08/2015: AORTA - BILATERAL FEMORAL ARTERY BYPASS GRAFT; N/A     Comment:  Procedure: AORTA BIFEMORAL BYPASS GRAFT;  Surgeon:               Katha Cabal, MD;  Location: [facility] ORS;  Service:               Vascular;  Laterality:  N/A; 2005: BACK SURGERY 1987: CERVICAL [place] SURGERY 12/30/2017: COLONOSCOPY WITH PROPOFOL; N/A     Comment:  Procedure: COLONOSCOPY WITH PROPOFOL;  Surgeon: [place],               Benay Pike, MD;  Location: [facility] ENDOSCOPY;  Service:               Gastroenterology;  Laterality: N/A; 11/08/2015: ENDARTERECTOMY FEMORAL; Bilateral     Comment:  Procedure: ENDARTERECTOMY FEMORAL;  Surgeon: Katha Cabal, MD;  Location: [facility] ORS;  Service: Vascular;                Laterality: Bilateral;  common, SFA, and               profundis  Aortic endarterectomy  09/04/2015: ESOPHAGOGASTRODUODENOSCOPY (EGD) WITH PROPOFOL; N/A     Comment:  Procedure: ESOPHAGOGASTRODUODENOSCOPY (EGD) WITH               PROPOFOL;  Surgeon: Lucilla Lame, MD;  Location: [place];  Service: Endoscopy;  Laterality: N/A; 2006: LUMBAR LAMINECTOMY 09/28/2020: SEPTOPLASTY; N/A     Comment:  Procedure: SEPTOPLASTY;  Surgeon: Margaretha Sheffield, MD;                Location: [place];  Service: ENT;                Laterality: N/A; 09/22/2015: TEE WITHOUT CARDIOVERSION; N/A     Comment:  Procedure: TRANSESOPHAGEAL ECHOCARDIOGRAM (TEE);                Surgeon: Teodoro Spray, MD;  Location: [facility] ORS;                Service: Cardiovascular;  Laterality: N/A; 09/28/2020: TURBINATE REDUCTION; Bilateral     Comment:  Procedure: TURBINATE REDUCTION;  Surgeon: Margaretha Sheffield,              MD;  Location: [place];  Service: ENT;                Laterality: Bilateral;  BMI    Body Mass Index: 25.47 kg/m      Reproductive/Obstetrics negative OB ROS                             Anesthesia Physical Anesthesia Plan  ASA: 3  Anesthesia Plan: General   Post-op Pain Management:    Induction: Intravenous  PONV Risk Score and Plan: Propofol infusion and TIVA  Airway Management Planned: Natural Airway and Nasal Cannula  Additional Equipment:   Intra-op  Plan:   Post-operative Plan:   Informed Consent: I have reviewed the patients History and Physical, chart, labs and discussed the procedure including the risks, benefits and alternatives for the proposed anesthesia with the patient or authorized representative who has indicated his/her understanding and acceptance.   Patient has DNR.  Discussed DNR with patient and Suspend DNR.   Dental Advisory Given  Plan Discussed with: Anesthesiologist, CRNA and Surgeon  Anesthesia Plan Comments: (Patient consented for risks of anesthesia including but not limited to:  - adverse reactions to medications - risk of airway placement if required - damage to eyes, teeth, lips or other oral mucosa - nerve damage due to positioning  - sore throat or hoarseness - Damage to heart, brain, nerves, lungs, other parts of body or loss of life  Patient voiced understanding.)        Anesthesia Quick Evaluation

## 2021-02-03 ENCOUNTER — Inpatient Hospital Stay: Payer: Medicare HMO

## 2021-02-03 DIAGNOSIS — R0602 Shortness of breath: Secondary | ICD-10-CM | POA: Diagnosis not present

## 2021-02-03 LAB — BASIC METABOLIC PANEL
Anion gap: 9 (ref 5–15)
BUN: 58 mg/dL — ABNORMAL HIGH (ref 8–23)
CO2: 25 mmol/L (ref 22–32)
Calcium: 8.8 mg/dL — ABNORMAL LOW (ref 8.9–10.3)
Chloride: 103 mmol/L (ref 98–111)
Creatinine, Ser: 2.38 mg/dL — ABNORMAL HIGH (ref 0.61–1.24)
GFR, Estimated: 29 mL/min — ABNORMAL LOW (ref >60)
Glucose, Bld: 103 mg/dL — ABNORMAL HIGH (ref 70–99)
Potassium: 3.9 mmol/L (ref 3.5–5.1)
Sodium: 137 mmol/L (ref 135–145)

## 2021-02-03 LAB — C3 COMPLEMENT: C3 Complement: 105 mg/dL (ref 82–167)

## 2021-02-03 LAB — ANA W/REFLEX IF POSITIVE: Anti Nuclear Antibody (ANA): NEGATIVE

## 2021-02-03 LAB — C4 COMPLEMENT: Complement C4, Body Fluid: 26 mg/dL (ref 12–38)

## 2021-02-03 LAB — HEMOGLOBIN AND HEMATOCRIT, BLOOD
HCT: 30.7 % — ABNORMAL LOW (ref 39.0–52.0)
Hemoglobin: 10.2 g/dL — ABNORMAL LOW (ref 13.0–17.0)

## 2021-02-03 LAB — GLOMERULAR BASEMENT MEMBRANE ANTIBODIES: GBM Ab: <0.2 units (ref 0.0–0.9)

## 2021-02-03 MED ORDER — HYDRALAZINE HCL 25 MG PO TABS
25.0000 mg | ORAL_TABLET | Freq: Three times a day (TID) | ORAL | Status: DC
  Administered 2021-02-03 – 2021-02-04 (×3): 25 mg via ORAL
  Filled 2021-02-03 (×3): qty 1

## 2021-02-03 MED ORDER — IRBESARTAN 150 MG PO TABS
150.0000 mg | ORAL_TABLET | Freq: Every day | ORAL | Status: DC
  Administered 2021-02-03: 150 mg via ORAL
  Filled 2021-02-03: qty 1

## 2021-02-03 MED ORDER — AMLODIPINE BESYLATE 10 MG PO TABS
10.0000 mg | ORAL_TABLET | Freq: Every day | ORAL | Status: DC
  Administered 2021-02-03 – 2021-02-06 (×4): 10 mg via ORAL
  Filled 2021-02-03 (×4): qty 1

## 2021-02-03 NOTE — Progress Notes (Signed)
PROGRESS NOTE    Gary Mccoy.   GEZ:[id number]  DOB: 05-Dec-1950  PCP: Rusty Aus, MD    DOA: 01/29/2021 LOS: 5    Brief Narrative / Hospital Course to Date:   70 y.o. male with past medical history significant of CVA, peripheral artery disease, placed on Xarelto by vascular surgery since 2017, chronic combined diastolic and systolic CHF, nonbleeding internal hemorrhoids, diverticulosis noted colonoscopy in 2019, gastritis, nonbleeding duodenal ulcer seen on EGD done in 2017, hyperlipidemia, essential hypertension, GERD who presented to [facility] ED on 01/29/21 with complaints of progressively worsening shortness of breath of 1 week duration, worse with exertion.  Also reported intermittent loose black stools for the past 6 weeks.  Denies use of NSAIDs.  Work-up in the ED revealed acute hypoxic respiratory failure secondary to acute CHF with concern for pulmonary edema.  Elevated troponin, was started on heparin drip and his home Xarelto was held.  Labs also revealed iron deficiency anemia.   Admitted to [facility] service.  Cardiology and GI consulted.  Assessment & Plan   Principal Problem:   Shortness of breath Active Problems:   Benign essential HTN   Duodenal ulcer disease   Tobacco abuse counseling   Peripheral vascular disease ([place])   Hyperlipidemia   CVA (cerebral vascular accident) ([place])   Acute respiratory failure with hypoxia (HCC)   Melena   Anemia   Acute systolic (congestive) heart failure (HCC)   Acute on chronic combined diastolic and systolic CHF - Presented with progressive dyspnea with minimal exertion, elevated BNP >1000, pulmonary edema seen on chest x-ray. Last Echo in 2019 revealed LVEF 45 to 50% with grade 1 diastolic dysfunction. Echo on 01/30/2021 showed LVEF 25 to 30%, left ventricle shows global hypokinesis, grade 2 diastolic dysfunction. --Cardiology consulted, appreciate recommendations --Diuretic transitioned to torsemide 20 mg daily per  nephrology --Started on Lopressor 12.5 mg p.o. twice daily --Hydralazine added for afterload reduction, being increased due to uncontrolled BP --Continue aspirin and statin --Strict I/O's and daily weights --Monitor renal function and electrolytes closely with diuresis --Needs ischemic evaluation, likely as outpatient.  Cardiac cath precluded by renal failure.  Acute respiratory failure with hypoxia due to below - no baseline oxygen requirement, was needing 4 L/min.  Resolved, weaned to room air. Acute exacerbation of suspected COPD versus bronchitis - Improving.  Off oxygen. Patient is a 50-year smoker and has poor aeration and diffuse expiratory wheezing on exam consistent with exacerbated obstructive pulmonary disease.  Noted to have a productive sounding cough. --Continue prednisone 40 mg daily --Scheduled Mucinex --As needed albuterol  --Recommend formal PFTs as outpatient --Supplement oxygen as needed to maintain sats above 90% -wean as tolerated   Elevated troponin due to suspect demand ischemia -likely secondary to hypoxia from pulmonary edema.  Troponin on presentation 126, peaked at 145 and downtrending. No acute ischemic changes on EKG.  No active chest pain --Xarelto held on admission, patient was on heparin drip -- Xarelto has been resumed, renally dosed 15 mg daily --Telemetry --Cardiology following   AKI on CKD 3B - likely prerenal azotemia due to dehydration from poor p.o. intake Baseline creatinine appears to be 1.6.  Presented with creatinine 2.36. --Nephrology consulted given need for diuresis --Avoid nephrotoxic agents and hypotension --Monitor renal function closely  --Monitor urine output --Resume ARB eventually --Follow-up pending SPEP/UPEP and hepatitis panel   Suspected upper GI bleed (clinically undetermined if due to [facility]) / Hx of intermittent melena Iron deficiency anemia History of gastritis/nonbleeding duodenal  ulcer seen on EGD in 2017. History of  nonbleeding internal hemorrhoids / diverticulosis seen on colonoscopy done in 2019. Iron deficiency anemia likely secondary to chronic blood loss. Hemoglobin on admission 9.9 --Trend hemoglobin, transfuse if less than 8.0 --Continue IV PPI --On heparin, monitor closely for recurrent bleeding --GI consulted, planning endoscopy - cleared by cardiology --Clear liquid diet and n.p.o. after midnight in case EGD tomorrow   Acute hypoxic respiratory failure -Resolved. Weaned to room air on afternoon of 11/17. Likely due to CHF and COPD exacerbation. Chest x-ray on admission which shows increasing pulmonary vascularity, bilateral pleural effusions. Requiring 4 liters per minute supplemental oxygen on admission. Not on oxygen supplementation at baseline. -- Management of CHF and COPD as above --Supplemental oxygen to maintain sats above 90%, wean as tolerated --Incentive spirometry   Sinus tachycardia -present on admission.   TSH mildly low at 0.230, normal free T4 at 0.86. --Treat underlying conditions as above --Telemetry --Metoprolol started on admission   Hyperlipidemia -on Crestor Fasting lipid panel: Cholesterol 129, HDL low 38, LDL 73, TGs 91    Patient BMI: Body mass index is 25.47 kg/m.   DVT prophylaxis:  Rivaroxaban (XARELTO) tablet 15 mg   Diet:  Diet Orders (From admission, onward)     Start     Ordered   02/03/21 0825  Diet Heart Room service appropriate? Yes; Fluid consistency: Thin; Fluid restriction: 1800 mL Fluid  Diet effective now       Question Answer Comment  Room service appropriate? Yes   Fluid consistency: Thin   Fluid restriction: 1800 mL Fluid      02/03/21 0824              Code Status: DNR   Subjective 02/03/21    Patient awake sitting up in bed when seen today.  Cardiologist was at the bedside.  Patient reports still being more dyspneic on exertion than he is at his baseline.  Still with cough but improving.  He is concerned about his blood  pressure running high and agreeable to stay for further titration of antihypertensive/cardiac medication.   Disposition Plan & Communication   Status is: Inpatient  Remains inpatient appropriate because: On IV therapies as above and pending GI evaluation once respiratory status is improved    Family Communication: Wife at bedside on rounds 11/18   Consults, Procedures, Significant Events   Consultants:  Cardiology Gastroenterology Nephrology  Procedures:  None  Antimicrobials:  Anti-infectives (From admission, onward)    None         Micro    Objective   Vitals:   02/03/21 0007 02/03/21 0747 02/03/21 0759 02/03/21 1127  BP: (!) 175/85 (!) 150/97 (!) 166/80 (!) 141/65  Pulse: (!) 57 63 (!) 56 64  Resp: _0 Temp: 97.6 F (36.4 C) 98.3 F (36.8 C) 97.6 F (36.4 C) 97.7 F (36.5 C)  TempSrc:  Oral Oral   SpO2: 97% 98% 99% 98%  Weight:      Height:        Intake/Output Summary (Last 24 hours) at 02/03/2021 1441 Last data filed at 02/03/2021 1126 Gross per 24 hour  Intake 243 ml  Output 1000 ml  Net -757 ml   Filed Weights   01/31/21 1007 02/01/21 0257 02/02/21 0538  Weight: 86.1 kg 87 kg 85.2 kg    Physical Exam:  General exam: awake sitting up in bed, alert, no acute distress Respiratory system: Lungs overall clear with improved aeration and  no expiratory wheezes today, on room air with normal respiratory effort at rest. Cardiovascular system: Regular rate and rhythm, no peripheral edema Central nervous system: Alert and oriented x4, normal speech, grossly nonfocal exam Psychiatry: normal mood, congruent affect, judgement and insight appear normal  Labs   Data Reviewed: I have personally reviewed following labs and imaging studies  CBC: Recent Labs  Lab 01/29/21 1700 01/30/21 0410 01/30/21 0744 01/30/21 1459 01/31/21 0434 02/01/21 0442 02/02/21 0548 02/03/21 0336  WBC 8.9 4.1  --   --  15.1* 16.6* 13.9*  --   NEUTROABS 7.3   --   --   --   --   --   --   --   HGB 10.9* 9.9*   < > 9.6* 10.2* 9.7* 10.2* 10.2*  HCT 33.1* 30.3*   < > 28.6* 30.7* 29.5* 30.5* 30.7*  MCV 88.3 89.4  --   --  89.0 88.1 88.4  --   PLT 218 188  --   --  222 220 209  --    < > = values in this interval not displayed.   Basic Metabolic Panel: Recent Labs  Lab 01/29/21 1700 01/30/21 0410 01/31/21 0434 02/01/21 0442 02/02/21 0548 02/03/21 0336  NA 139 138 139 137 139 137  K 4.2 4.6 4.6 4.7 4.1 3.9  CL 108 109 107 103 106 103  CO2 21* 20* _0 GLUCOSE 104* 166* 128* 123* 112* 103*  BUN 30* 34* 47* 50* 63* 58*  CREATININE 2.23* 2.36* 2.46* 2.17* 2.36* 2.38*  CALCIUM 9.1 8.8* 8.9 8.6* 8.7* 8.8*  MG 2.0  --  2.1  --   --   --   PHOS  --   --  4.4  --   --   --    GFR: Estimated Creatinine Clearance: 32.2 mL/min (A) (by C-G formula based on SCr of 2.38 mg/dL (H)). Liver Function Tests: Recent Labs  Lab 01/29/21 1700 01/31/21 0434  AST 29 39  ALT 25 24  ALKPHOS 85 69  BILITOT 1.7* 1.0  PROT 7.9 7.2  ALBUMIN 4.6 4.0   No results for input(s): LIPASE, AMYLASE in the last 168 hours. No results for input(s): AMMONIA in the last 168 hours. Coagulation Profile: Recent Labs  Lab 01/29/21 1700  INR 1.9*   Cardiac Enzymes: No results for input(s): CKTOTAL, CKMB, CKMBINDEX, TROPONINI in the last 168 hours. BNP (last 3 results) No results for input(s): PROBNP in the last 8760 hours. HbA1C: No results for input(s): HGBA1C in the last 72 hours.  CBG: No results for input(s): GLUCAP in the last 168 hours. Lipid Profile: No results for input(s): CHOL, HDL, LDLCALC, TRIG, CHOLHDL, LDLDIRECT in the last 72 hours.  Thyroid Function Tests: No results for input(s): TSH, T4TOTAL, FREET4, T3FREE, THYROIDAB in the last 72 hours.  Anemia Panel: No results for input(s): VITAMINB12, FOLATE, FERRITIN, TIBC, IRON, RETICCTPCT in the last 72 hours.  Sepsis Labs: No results for input(s): PROCALCITON, LATICACIDVEN in the last 168  hours.  Recent Results (from the past 240 hour(s))  Resp Panel by RT-PCR (Flu A&B, Covid) Nasopharyngeal Swab     Status: None   Collection Time: 01/29/21  9:17 PM   Specimen: Nasopharyngeal Swab; Nasopharyngeal(NP) swabs in vial transport medium  Result Value Ref Range Status   SARS Coronavirus 2 by RT PCR NEGATIVE NEGATIVE Final    Comment: (NOTE) SARS-CoV-2 target nucleic acids are NOT DETECTED.  The SARS-CoV-2 RNA is generally detectable in  upper respiratory specimens during the acute phase of infection. The lowest concentration of SARS-CoV-2 viral copies this assay can detect is 138 copies/mL. A negative result does not preclude SARS-Cov-2 infection and should not be used as the sole basis for treatment or other patient management decisions. A negative result may occur with  improper specimen collection/handling, submission of specimen other than nasopharyngeal swab, presence of viral mutation(s) within the areas targeted by this assay, and inadequate number of viral copies(<138 copies/mL). A negative result must be combined with clinical observations, patient history, and epidemiological information. The expected result is Negative.  Fact Sheet for Patients:  EntrepreneurPulse.com.au  Fact Sheet for Healthcare Providers:  IncredibleEmployment.be  This test is no t yet approved or cleared by the [place] FDA and  has been authorized for detection and/or diagnosis of SARS-CoV-2 by FDA under an Emergency Use Authorization (EUA). This EUA will remain  in effect (meaning this test can be used) for the duration of the COVID-19 declaration under Section 564(b)(1) of the Act, 21 U.S.C.section 360bbb-3(b)(1), unless the authorization is terminated  or revoked sooner.       Influenza A by PCR NEGATIVE NEGATIVE Final   Influenza B by PCR NEGATIVE NEGATIVE Final    Comment: (NOTE) The Xpert Xpress SARS-CoV-2/FLU/RSV plus assay is intended  as an aid in the diagnosis of influenza from Nasopharyngeal swab specimens and should not be used as a sole basis for treatment. Nasal washings and aspirates are unacceptable for Xpert Xpress SARS-CoV-2/FLU/RSV testing.  Fact Sheet for Patients: EntrepreneurPulse.com.au  Fact Sheet for Healthcare Providers: IncredibleEmployment.be  This test is not yet approved or cleared by the [place] FDA and has been authorized for detection and/or diagnosis of SARS-CoV-2 by FDA under an Emergency Use Authorization (EUA). This EUA will remain in effect (meaning this test can be used) for the duration of the COVID-19 declaration under Section 564(b)(1) of the Act, 21 U.S.C. section 360bbb-3(b)(1), unless the authorization is terminated or revoked.  Performed at [facility], [address] [place], [place] [address]   KOH prep     Status: None   Collection Time: 02/02/21  3:18 PM   Specimen: Esophagus  Result Value Ref Range Status   Specimen Description ESOPHAGUS  Final   Special Requests Normal  Final   KOH Prep   Final    YEAST WITH PSEUDOHYPHAE Performed at [facility], [address], [place], [place] [address]    Report Status 02/02/2021 FINAL  Final      Imaging Studies   No results found.   Medications   Scheduled Meds:  amLODipine  10 mg Oral Daily   aspirin EC  81 mg Oral Daily   dextromethorphan-guaiFENesin  1 tablet Oral BID   hydrALAZINE  25 mg Oral Q8H   irbesartan  150 mg Oral Daily   metoprolol tartrate  12.5 mg Oral BID   nicotine  14 mg Transdermal Daily   pantoprazole (PROTONIX) IV  40 mg Intravenous Q24H   predniSONE  40 mg Oral Q breakfast   rivaroxaban  15 mg Oral Daily   rosuvastatin  20 mg Oral Daily   sodium chloride flush  3 mL Intravenous Q12H   torsemide  20 mg Oral Daily   Continuous Infusions:       LOS: 5 days    Time spent: 25 minutes with > 50% spent at bedside and in  coordination of care     Ezekiel Slocumb, DO [facility]  02/03/2021, 2:41 PM      If 7PM-7AM, please contact night-coverage. How to contact the [facility] Attending or Consulting provider [place] or covering provider during after hours [place], for this patient?    Check the care team in [facility] and look for a) attending/consulting [facility] provider listed and b) the [facility] team listed Log into [email or web address] and use [place]'s universal password to access. If you do not have the password, please contact the hospital operator. Locate the [facility] provider you are looking for under [facility] and page to a number that you can be directly reached. If you still have difficulty reaching the provider, please page the [facility] (Director on Call) for the Hospitalists listed on amion for assistance.

## 2021-02-03 NOTE — Progress Notes (Signed)
[facility] Cardiology    SUBJECTIVE:   - No significant shortness of breath. No chest pain.  - LE edema resolved.    Vitals:   02/02/21 2055 02/03/21 0007 02/03/21 0747 02/03/21 0759  BP: (!) 162/79 (!) 175/85 (!) 150/97 (!) 166/80  Pulse: 76 (!) 57 63 (!) 56  Resp: _0 Temp: 97.9 F (36.6 C) 97.6 F (36.4 C) 98.3 F (36.8 C) 97.6 F (36.4 C)  TempSrc:   Oral Oral  SpO2: 98% 97% 98% 99%  Weight:      Height:         Intake/Output Summary (Last 24 hours) at 02/03/2021 0850 Last data filed at 02/02/2021 2141 Gross per 24 hour  Intake 243 ml  Output 2100 ml  Net -1857 ml       PHYSICAL EXAM  General: Well developed, well nourished, in no acute distress HEENT:  Normocephalic and atramatic Neck:  No JVD.  Lungs: Diminished breath sounds clear bilaterally to auscultation and percussion. Heart: HRRR . Normal S1 and S2 without gallops or murmurs.  Abdomen: Bowel sounds are positive, abdomen soft and non-tender  Msk:  Back normal, normal gait. Normal strength and tone for age. Extremities: No clubbing, cyanosis or 2+edema.   Neuro: Alert and oriented X 3. Psych:  Good affect, responds appropriately   LABS: Basic Metabolic Panel: Recent Labs    02/02/21 0548 02/03/21 0336  NA 139 137  K 4.1 3.9  CL 106 103  CO2 23 25  GLUCOSE 112* 103*  BUN 63* 58*  CREATININE 2.36* 2.38*  CALCIUM 8.7* 8.8*    Liver Function Tests: No results for input(s): AST, ALT, ALKPHOS, BILITOT, PROT, ALBUMIN in the last 72 hours.  No results for input(s): LIPASE, AMYLASE in the last 72 hours. CBC: Recent Labs    02/01/21 0442 02/02/21 0548 02/03/21 0336  WBC 16.6* 13.9*  --   HGB 9.7* 10.2* 10.2*  HCT 29.5* 30.5* 30.7*  MCV 88.1 88.4  --   PLT 220 209  --     Cardiac Enzymes: No results for input(s): CKTOTAL, CKMB, CKMBINDEX, TROPONINI in the last 72 hours. BNP: Invalid input(s): POCBNP D-Dimer: No results for input(s): DDIMER in the last 72 hours. Hemoglobin  A1C: No results for input(s): HGBA1C in the last 72 hours.  Fasting Lipid Panel: No results for input(s): CHOL, HDL, LDLCALC, TRIG, CHOLHDL, LDLDIRECT in the last 72 hours.  Thyroid Function Tests: No results for input(s): TSH, T4TOTAL, T3FREE, THYROIDAB in the last 72 hours.  Invalid input(s): FREET3  Anemia Panel: No results for input(s): VITAMINB12, FOLATE, FERRITIN, TIBC, IRON, RETICCTPCT in the last 72 hours.   US RENAL  Result Date: 02/01/2021 CLINICAL DATA:  Acute kidney failure.  Hematuria EXAM: RENAL / URINARY TRACT ULTRASOUND COMPLETE COMPARISON:  CT abdomen pelvis 10/15/2019 FINDINGS: Right Kidney: Renal measurements: 7.0 x 4.1 x 3.4 cm = volume: 50 mL. Echogenicity within normal limits. No mass or hydronephrosis visualized. Left Kidney: Renal measurements: 10.8 x 5.1 x 4.7 cm = volume: 134 mL. Small arterial calcifications noted on CT. Echogenicity within normal limits. No mass or hydronephrosis visualized. Bladder: Appears normal for degree of bladder distention. Other: Moderately large pleural effusions bilaterally with bibasilar atelectasis IMPRESSION: Negative for renal obstruction.  Atrophic right kidney Moderately large bilateral pleural effusions. Electronically Signed   By: Franchot Gallo M.D.   On: 02/01/2021 10:00     Echo moderate to severely depressed left ventricular function at 25 to 30%  TELEMETRY:  Sinus rhythm 85 nonspecific findings:  ASSESSMENT AND PLAN:  Principal Problem:   Shortness of breath Active Problems:   Benign essential HTN   Duodenal ulcer disease   Tobacco abuse counseling   Peripheral vascular disease (HCC)   Hyperlipidemia   CVA (cerebral vascular accident) ([place])   Acute respiratory failure with hypoxia (HCC)   Melena   Anemia   Acute systolic (congestive) heart failure ([place]) Demand ischemia this does not represent a non-STEMI  Plan The office Carden is a 70 year old male with history of CVA, PAD (on Xarelto following vascular  surgery), HFrEF (previously 45-50, now 25 to 30%), hypertension, hyperlipidemia who was admitted with shortness of breath.  Cardiology is consulted for elevated troponin in presence of pulmonary edema.  Additionally we are consulted for evaluation of newly reduced ejection fraction.  #Acute systolic heart failure #Acute renal insufficiency #Demand ischemia  EF previously 45 to 50%, now decreased to 25 to 30%.  He was grossly volume overloaded on admission with pulmonary edema and has been improving with diuresis.  Troponin peak was 185.  Patient has renal insufficiency with a baseline creatinine of approximately 1.7-2, and is currently elevated to 2.4. -Continue diuresis with torsemide 20 mg daily -Continue metoprolol 12.5 mg twice daily -Continue aspirin 81 mg daily -Continue Crestor 20 mg daily -We will need addition of afterload reduction.  Given renal insufficiency we will hold on initiating ACE or ARB.  Increase hydral 25 mg TID PO. Will add isordil this afternoon.   -We will need ischemic evaluation.  Given his ongoing bleeding issues and renal insufficiency this can likely happen as an outpatient.   [name], MD 02/03/2021 8:50 AM

## 2021-02-03 NOTE — Progress Notes (Signed)
Patient given oral contrast to drink over the next 30-45 minutes.

## 2021-02-03 NOTE — Progress Notes (Signed)
[facility]  ROUNDING NOTE   Subjective:   EGD and colonoscopy did not find a source for bleeding.   UOP 2126m.   Patient states he is breathing much better. No longer requiring oxgyen. Denies any wheezing.   Objective:  Vital signs in last 24 hours:  Temp:  [97 F (36.1 C)-98.3 F (36.8 C)] 97.6 F (36.4 C) (11/19 0759) Pulse Rate:  [55-76] 56 (11/19 0759) Resp:  [16-20] 18 (11/19 0759) BP: (145-175)/(72-97) 166/80 (11/19 0759) SpO2:  [93 %-99 %] 99 % (11/19 0759)  Weight change:  Filed Weights   01/31/21 1007 02/01/21 0257 02/02/21 0538  Weight: 86.1 kg 87 kg 85.2 kg    Intake/Output: I/O last 3 completed shifts: In: 412.9 [P.O.:240; I.V.:172.9] Out: 3040 [Urine:3040]   Intake/Output this shift:  No intake/output data recorded.  Physical Exam: General: NAD, sitting up in bed  Head: Normocephalic, atraumatic. Moist oral mucosal membranes  Eyes: Anicteric, PERRL  Neck: Supple, trachea midline  Lungs:  Clear to auscultation, on room air  Heart: Regular rate and rhythm  Abdomen:  Soft, nontender,   Extremities:  no peripheral edema.  Neurologic: Nonfocal, moving all four extremities  Skin: No lesions        Basic Metabolic Panel: Recent Labs  Lab 01/29/21 1700 01/30/21 0410 01/31/21 0434 02/01/21 0442 02/02/21 0548 02/03/21 0336  NA 139 138 139 137 139 137  K 4.2 4.6 4.6 4.7 4.1 3.9  CL 108 109 107 103 106 103  CO2 21* 20* _0 GLUCOSE 104* 166* 128* 123* 112* 103*  BUN 30* 34* 47* 50* 63* 58*  CREATININE 2.23* 2.36* 2.46* 2.17* 2.36* 2.38*  CALCIUM 9.1 8.8* 8.9 8.6* 8.7* 8.8*  MG 2.0  --  2.1  --   --   --   PHOS  --   --  4.4  --   --   --      Liver Function Tests: Recent Labs  Lab 01/29/21 1700 01/31/21 0434  AST 29 39  ALT 25 24  ALKPHOS 85 69  BILITOT 1.7* 1.0  PROT 7.9 7.2  ALBUMIN 4.6 4.0    No results for input(s): LIPASE, AMYLASE in the last 168 hours. No results for input(s): AMMONIA in the last 168  hours.  CBC: Recent Labs  Lab 01/29/21 1700 01/30/21 0410 01/30/21 0744 01/30/21 1459 01/31/21 0434 02/01/21 0442 02/02/21 0548 02/03/21 0336  WBC 8.9 4.1  --   --  15.1* 16.6* 13.9*  --   NEUTROABS 7.3  --   --   --   --   --   --   --   HGB 10.9* 9.9*   < > 9.6* 10.2* 9.7* 10.2* 10.2*  HCT 33.1* 30.3*   < > 28.6* 30.7* 29.5* 30.5* 30.7*  MCV 88.3 89.4  --   --  89.0 88.1 88.4  --   PLT 218 188  --   --  222 220 209  --    < > = values in this interval not displayed.     Cardiac Enzymes: No results for input(s): CKTOTAL, CKMB, CKMBINDEX, TROPONINI in the last 168 hours.  BNP: Invalid input(s): POCBNP  CBG: No results for input(s): GLUCAP in the last 168 hours.  Microbiology: Results for orders placed or performed during the hospital encounter of 01/29/21  Resp Panel by RT-PCR (Flu A&B, Covid) Nasopharyngeal Swab     Status: None   Collection Time: 01/29/21  9:17 PM  Specimen: Nasopharyngeal Swab; Nasopharyngeal(NP) swabs in vial transport medium  Result Value Ref Range Status   SARS Coronavirus 2 by RT PCR NEGATIVE NEGATIVE Final    Comment: (NOTE) SARS-CoV-2 target nucleic acids are NOT DETECTED.  The SARS-CoV-2 RNA is generally detectable in upper respiratory specimens during the acute phase of infection. The lowest concentration of SARS-CoV-2 viral copies this assay can detect is 138 copies/mL. A negative result does not preclude SARS-Cov-2 infection and should not be used as the sole basis for treatment or other patient management decisions. A negative result may occur with  improper specimen collection/handling, submission of specimen other than nasopharyngeal swab, presence of viral mutation(s) within the areas targeted by this assay, and inadequate number of viral copies(<138 copies/mL). A negative result must be combined with clinical observations, patient history, and epidemiological information. The expected result is Negative.  Fact Sheet for  Patients:  EntrepreneurPulse.com.au  Fact Sheet for Healthcare Providers:  IncredibleEmployment.be  This test is no t yet approved or cleared by the [place] FDA and  has been authorized for detection and/or diagnosis of SARS-CoV-2 by FDA under an Emergency Use Authorization (EUA). This EUA will remain  in effect (meaning this test can be used) for the duration of the COVID-19 declaration under Section 564(b)(1) of the Act, 21 U.S.C.section 360bbb-3(b)(1), unless the authorization is terminated  or revoked sooner.       Influenza A by PCR NEGATIVE NEGATIVE Final   Influenza B by PCR NEGATIVE NEGATIVE Final    Comment: (NOTE) The Xpert Xpress SARS-CoV-2/FLU/RSV plus assay is intended as an aid in the diagnosis of influenza from Nasopharyngeal swab specimens and should not be used as a sole basis for treatment. Nasal washings and aspirates are unacceptable for Xpert Xpress SARS-CoV-2/FLU/RSV testing.  Fact Sheet for Patients: EntrepreneurPulse.com.au  Fact Sheet for Healthcare Providers: IncredibleEmployment.be  This test is not yet approved or cleared by the [place] FDA and has been authorized for detection and/or diagnosis of SARS-CoV-2 by FDA under an Emergency Use Authorization (EUA). This EUA will remain in effect (meaning this test can be used) for the duration of the COVID-19 declaration under Section 564(b)(1) of the Act, 21 U.S.C. section 360bbb-3(b)(1), unless the authorization is terminated or revoked.  Performed at [facility], [address] [place], [place] [address]   KOH prep     Status: None   Collection Time: 02/02/21  3:18 PM   Specimen: Esophagus  Result Value Ref Range Status   Specimen Description ESOPHAGUS  Final   Special Requests Normal  Final   KOH Prep   Final    YEAST WITH PSEUDOHYPHAE Performed at [facility], [address]  [place], [place] [address]    Report Status 02/02/2021 FINAL  Final    Coagulation Studies: No results for input(s): LABPROT, INR in the last 72 hours.   Urinalysis: No results for input(s): COLORURINE, LABSPEC, PHURINE, GLUCOSEU, HGBUR, BILIRUBINUR, KETONESUR, PROTEINUR, UROBILINOGEN, NITRITE, LEUKOCYTESUR in the last 72 hours.  Invalid input(s): APPERANCEUR     Imaging: No results found.   Medications:      amLODipine  10 mg Oral Daily   aspirin EC  81 mg Oral Daily   dextromethorphan-guaiFENesin  1 tablet Oral BID   hydrALAZINE  25 mg Oral Q8H   metoprolol tartrate  12.5 mg Oral BID   nicotine  14 mg Transdermal Daily   pantoprazole (PROTONIX) IV  40 mg Intravenous Q24H   predniSONE  40 mg Oral Q  breakfast   rivaroxaban  15 mg Oral Daily   rosuvastatin  20 mg Oral Daily   sodium chloride flush  3 mL Intravenous Q12H   torsemide  20 mg Oral Daily   acetaminophen **OR** acetaminophen, albuterol, hydrALAZINE, morphine injection, ondansetron **OR** ondansetron (ZOFRAN) IV  Assessment/ Plan:  [name] is a 70 y.o. white male with COPD, hypertension, peripheral vascular disease, CVA, CO exposure, peptic ulcer disease, who was admitted to [facility] on 01/29/2021 for Respiratory distress [R06.03] Elevated troponin [R77.8] Acute respiratory failure with hypoxia (HCC) [G66.59] Acute systolic (congestive) heart failure (HCC) [I50.21] Acute on chronic congestive heart failure, unspecified heart failure type (HCC) [I50.9]   Acute kidney injury on chronic kidney disease stage IIIB: baseline creatinine of 1.8, GFR of 38. Chronic kidney disease seems to be secondary to NSAID (years of using meloxicam and celecoxib). No IV contrast exposure. Renal ultrasound without obstruction but with atrophic right kidney - Pending serologic work up - Restart olmesartan 12m daily on discharge. Inpatient, will start irbesartan 1513mdaily today.    Acute exacerbation of new onset systolic  and diastolic congestive heart failure - PO torsemide 2040maily. - Continue fluid restriction   Hypertension: 166/80 - not at goal. Driven by COPD and systemic steroids. Prior to admission, home regimen included amlodipine 5mg67mlmesartan and hydrochlorothiazide - restarted amlodipine.  - hydralazine started this admission - As above, start irbesartan. Will discharge on olmesartan.    Hematuria: patient has had hematuria on urinalysis in 2019. Ultrasound with no cause. Recommend urology referral as outpatient.  - pending serologies.  - Check CT abd/pelvis.    LOS: 5 Treniyah Lynn 11/19/202210:12 AM

## 2021-02-04 DIAGNOSIS — R0602 Shortness of breath: Secondary | ICD-10-CM | POA: Diagnosis not present

## 2021-02-04 LAB — BASIC METABOLIC PANEL
Anion gap: 9 (ref 5–15)
BUN: 69 mg/dL — ABNORMAL HIGH (ref 8–23)
CO2: 25 mmol/L (ref 22–32)
Calcium: 8.7 mg/dL — ABNORMAL LOW (ref 8.9–10.3)
Chloride: 102 mmol/L (ref 98–111)
Creatinine, Ser: 2.9 mg/dL — ABNORMAL HIGH (ref 0.61–1.24)
GFR, Estimated: 23 mL/min — ABNORMAL LOW (ref >60)
Glucose, Bld: 103 mg/dL — ABNORMAL HIGH (ref 70–99)
Potassium: 4 mmol/L (ref 3.5–5.1)
Sodium: 136 mmol/L (ref 135–145)

## 2021-02-04 MED ORDER — ISOSORBIDE DINITRATE 10 MG PO TABS
10.0000 mg | ORAL_TABLET | Freq: Three times a day (TID) | ORAL | Status: DC
Start: 2021-02-04 — End: 2021-02-05
  Administered 2021-02-04: 10 mg via ORAL
  Filled 2021-02-04 (×2): qty 1

## 2021-02-04 MED ORDER — PANTOPRAZOLE SODIUM 40 MG PO TBEC
40.0000 mg | DELAYED_RELEASE_TABLET | Freq: Every day | ORAL | Status: DC
  Administered 2021-02-05 – 2021-02-06 (×2): 40 mg via ORAL
  Filled 2021-02-04 (×2): qty 1

## 2021-02-04 MED ORDER — METOPROLOL SUCCINATE ER 25 MG PO TB24
25.0000 mg | ORAL_TABLET | Freq: Every day | ORAL | Status: DC
  Administered 2021-02-05 – 2021-02-06 (×2): 25 mg via ORAL
  Filled 2021-02-04 (×2): qty 1

## 2021-02-04 MED ORDER — HYDRALAZINE HCL 50 MG PO TABS
50.0000 mg | ORAL_TABLET | Freq: Three times a day (TID) | ORAL | Status: DC
  Administered 2021-02-04 – 2021-02-06 (×6): 50 mg via ORAL
  Filled 2021-02-04 (×6): qty 1

## 2021-02-04 NOTE — Progress Notes (Signed)
[facility] Cardiology    SUBJECTIVE:   - Feels ok this morning.  - Breathing feels good this morning. No Le edema. No chest pain.    Vitals:   02/04/21 0049 02/04/21 0257 02/04/21 0633 02/04/21 0746  BP: (!) 151/82 (!) 136/59 (!) 164/80 (!) 150/65  Pulse: (!) 58 (!) 59 60 61  Resp:    17  Temp: 97.9 F (36.6 C) 97.7 F (36.5 C)  98.7 F (37.1 C)  TempSrc: Oral Oral    SpO2: 94% 94%  96%  Weight:      Height:         Intake/Output Summary (Last 24 hours) at 02/04/2021 8768 Last data filed at 02/04/2021 1157 Gross per 24 hour  Intake 480 ml  Output 1700 ml  Net -1220 ml       PHYSICAL EXAM  General: Well developed, well nourished, in no acute distress HEENT:  Normocephalic and atramatic Neck:  No JVD.  Lungs: Diminished breath sounds clear bilaterally to auscultation and percussion. Heart: HRRR . Normal S1 and S2 without gallops or murmurs.  Abdomen: Bowel sounds are positive, abdomen soft and non-tender  Msk:  Back normal, normal gait. Normal strength and tone for age. Extremities: No clubbing, cyanosis or 2+edema.   Neuro: Alert and oriented X 3. Psych:  Good affect, responds appropriately   LABS: Basic Metabolic Panel: Recent Labs    02/03/21 0336 02/04/21 0616  NA 137 136  K 3.9 4.0  CL 103 102  CO2 25 25  GLUCOSE 103* 103*  BUN 58* 69*  CREATININE 2.38* 2.90*  CALCIUM 8.8* 8.7*    Liver Function Tests: No results for input(s): AST, ALT, ALKPHOS, BILITOT, PROT, ALBUMIN in the last 72 hours.  No results for input(s): LIPASE, AMYLASE in the last 72 hours. CBC: Recent Labs    02/02/21 0548 02/03/21 0336  WBC 13.9*  --   HGB 10.2* 10.2*  HCT 30.5* 30.7*  MCV 88.4  --   PLT 209  --     Cardiac Enzymes: No results for input(s): CKTOTAL, CKMB, CKMBINDEX, TROPONINI in the last 72 hours. BNP: Invalid input(s): POCBNP D-Dimer: No results for input(s): DDIMER in the last 72 hours. Hemoglobin A1C: No results for input(s): HGBA1C in the last 72  hours.  Fasting Lipid Panel: No results for input(s): CHOL, HDL, LDLCALC, TRIG, CHOLHDL, LDLDIRECT in the last 72 hours.  Thyroid Function Tests: No results for input(s): TSH, T4TOTAL, T3FREE, THYROIDAB in the last 72 hours.  Invalid input(s): FREET3  Anemia Panel: No results for input(s): VITAMINB12, FOLATE, FERRITIN, TIBC, IRON, RETICCTPCT in the last 72 hours.   CT ABDOMEN PELVIS WO CONTRAST  Result Date: 02/03/2021 CLINICAL DATA:  Hematuria, unknown cause. EXAM: CT ABDOMEN AND PELVIS WITHOUT CONTRAST TECHNIQUE: Multidetector CT imaging of the abdomen and pelvis was performed following the standard protocol without IV contrast. COMPARISON:  None. FINDINGS: Lower chest: Small right pleural effusion. Trace left pleural effusion. Associated passive atelectasis of bilateral lower lobes. Right middle lobe atelectasis. Coronary artery calcifications. Suggestion of anemia given cardiac appearance. Hepatobiliary: Subcentimeter hypodensity too small to characterize. Otherwise no focal liver abnormality. Contracted gallbladder with query intraluminal stones or sludge. No definite wall thickening or pericholecystic fluid. No biliary dilatation. Pancreas: No focal lesion. Normal pancreatic contour. No surrounding inflammatory changes. No main pancreatic ductal dilatation. Spleen: Normal in size without focal abnormality. Adrenals/Urinary Tract: No adrenal nodule bilaterally. Atrophic and scarred right kidney. Scarring of the left kidney. Calcifications associated with kidneys likely vascular.  Nonspecific trace perinephric stranding bilaterally. No nephrolithiasis and no hydronephrosis. No definite contour-deforming renal mass. No ureterolithiasis or hydroureter. The urinary bladder is unremarkable. Stomach/Bowel: PO contrast reaches the terminal ileum. Stomach is within normal limits. No evidence of bowel wall thickening or dilatation. Scattered colonic diverticulosis. Appendix appears normal.  Vascular/Lymphatic: Aortobifemoral artery bypass graft with markedly limited evaluation on this noncontrast study. No abdominal aorta or iliac aneurysm. Moderate to severe atherosclerotic plaque of the aorta and its branches. No abdominal, pelvic, or inguinal lymphadenopathy. Reproductive: Prostate is unremarkable. Other: No intraperitoneal free fluid. No intraperitoneal free gas. No organized fluid collection. Musculoskeletal: There is a tiny fat containing umbilical hernia with an abdominal defect of 0.5 cm. No suspicious lytic or blastic osseous lesions. No acute displaced fracture. Multilevel degenerative changes of the spine. IMPRESSION: 1. Small right and trace left pleural effusions. 2. Atrophic and scarred right kidney. Scarring of the left kidney. Markedly limited evaluation on this noncontrast study. 3. Scattered colonic diverticulosis with no acute diverticulitis. 4.  Contracted gallbladder with query intraluminal stones or sludge. 5. Aortic Atherosclerosis (ICD10-I70.0). Aortobifemoral artery bypass graft with markedly limited evaluation on this noncontrast study. Electronically Signed   By: Iven Finn M.D.   On: 02/03/2021 20:00     Echo moderate to severely depressed left ventricular function at 25 to 30%  TELEMETRY: Sinus rhythm 85 nonspecific findings:  ASSESSMENT AND PLAN:  Principal Problem:   Shortness of breath Active Problems:   Benign essential HTN   Duodenal ulcer disease   Tobacco abuse counseling   Peripheral vascular disease (HCC)   Hyperlipidemia   CVA (cerebral vascular accident) ([place])   Acute respiratory failure with hypoxia (HCC)   Melena   Anemia   Acute systolic (congestive) heart failure ([place]) Demand ischemia this does not represent a non-STEMI  Plan Fomby is a 70 year old male with history of CVA, PAD (on Xarelto following vascular surgery), HFrEF (previously 45-50, now 25 to 30%), hypertension, hyperlipidemia who was admitted with shortness of  breath.  Cardiology is consulted for elevated troponin in presence of pulmonary edema.  Additionally we are consulted for evaluation of newly reduced ejection fraction.  #Acute systolic heart failure #Acute renal insufficiency #Demand ischemia EF previously 45 to 50%, now decreased to 25 to 30%.  He was grossly volume overloaded on admission with pulmonary edema and has been improving with diuresis.  Troponin peak was 185.  Patient has renal insufficiency with a baseline creatinine of approximately 1.7-2, and is currently elevated to 2.9 - ARB initiated yesterday with large bump in Cr today. Will hold temporarily -Hold diuresis with torsemide 20 mg daily -Continue metoprolol 12.5 mg twice daily -Continue aspirin 81 mg daily -Continue Crestor 20 mg daily -We will need addition of afterload reduction.  Given renal insufficiency we will hold on initiating ACE or ARB.  Increase hydral 50 mg TID PO. Will add isordil this afternoon.   -We will need ischemic evaluation.  Given his ongoing bleeding issues and renal insufficiency this can likely happen as an outpatient.   [name], MD 02/04/2021 8:39 AM

## 2021-02-04 NOTE — Progress Notes (Signed)
PROGRESS NOTE    Gary Mccoy.   YWV:[id number]  DOB: 06-15-1950  PCP: Rusty Aus, MD    DOA: 01/29/2021 LOS: 6    Brief Narrative / Hospital Course to Date:   70 y.o. male with past medical history significant of CVA, peripheral artery disease, placed on Xarelto by vascular surgery since 2017, chronic combined diastolic and systolic CHF, nonbleeding internal hemorrhoids, diverticulosis noted colonoscopy in 2019, gastritis, nonbleeding duodenal ulcer seen on EGD done in 2017, hyperlipidemia, essential hypertension, GERD who presented to [facility] ED on 01/29/21 with complaints of progressively worsening shortness of breath of 1 week duration, worse with exertion.  Also reported intermittent loose black stools for the past 6 weeks.  Denies use of NSAIDs.  Work-up in the ED revealed acute hypoxic respiratory failure secondary to acute CHF with concern for pulmonary edema.  Elevated troponin, was started on heparin drip and his home Xarelto was held.  Labs also revealed iron deficiency anemia.   Admitted to [facility] service.  Cardiology and GI consulted.  Assessment & Plan   Principal Problem:   Shortness of breath Active Problems:   Benign essential HTN   Duodenal ulcer disease   Tobacco abuse counseling   Peripheral vascular disease ([place])   Hyperlipidemia   CVA (cerebral vascular accident) ([place])   Acute respiratory failure with hypoxia (HCC)   Melena   Anemia   Acute systolic (congestive) heart failure (HCC)   Acute on chronic combined diastolic and systolic CHF - Presented with progressive dyspnea with minimal exertion, elevated BNP >1000, pulmonary edema seen on chest x-ray. Last Echo in 2019 revealed LVEF 45 to 50% with grade 1 diastolic dysfunction. Echo on 01/30/2021 showed LVEF 25 to 30%, left ventricle shows global hypokinesis, grade 2 diastolic dysfunction. --Cardiology consulted, appreciate recommendations --Hold torsemide with rising Cr --Started on Lopressor 12.5  mg p.o. twice daily --Hydralazine added for afterload reduction, dosing per cardiology --Isordil added 11/20 --Continue aspirin and statin --Strict I/O's and daily weights --Monitor renal function and electrolytes closely with diuresis --Needs ischemic evaluation, likely as outpatient.  Cardiac cath precluded by renal failure.  Acute respiratory failure with hypoxia due to below - no baseline oxygen requirement, was needing 4 L/min.  Resolved, weaned to room air. Acute exacerbation of suspected COPD versus bronchitis - Improving.  Off oxygen. Patient is a 50-year smoker and has poor aeration and diffuse expiratory wheezing on exam consistent with exacerbated obstructive pulmonary disease.  Noted to have a productive sounding cough. --Completed prednisone 11/20 --Scheduled Mucinex --As needed albuterol  --Recommend formal PFTs as outpatient --Supplement oxygen as needed to maintain sats above 90%    Elevated troponin due to suspect demand ischemia -likely secondary to hypoxia from pulmonary edema.  Troponin on presentation 126, peaked at 145 and downtrending. No acute ischemic changes on EKG.  No active chest pain --Xarelto held on admission, patient was on heparin drip -- Xarelto has been resumed, renally dosed 15 mg daily --Telemetry --Cardiology following   AKI on CKD 3B - likely prerenal azotemia due to dehydration from poor p.o. intake Baseline creatinine appears to be 1.6.  Presented with creatinine 2.36. 11/20: Cr rose to 2.9 after resuming ARB, on diuretic --Nephrology consulted  --Hold ARB and torsemide --Avoid nephrotoxic agents and hypotension --Monitor renal function closely  --Monitor urine output --Follow-up pending SPEP/UPEP and hepatitis panel   Suspected upper GI bleed (clinically undetermined if due to [facility]) / Hx of intermittent melena Iron deficiency anemia History of gastritis/nonbleeding duodenal  ulcer seen on EGD in 2017. History of nonbleeding internal  hemorrhoids / diverticulosis seen on colonoscopy done in 2019. Iron deficiency anemia likely secondary to chronic blood loss.  Hemoglobin on admission 9.9 --Trend hemoglobin, transfuse if less than 8.0 --Change to PO Protonix --GI consulted EGD -- 11/18  - White nummular lesions in esophageal mucosa.  Brushings performed. - Normal stomach. - Normal examined duodenum. Colonoscopy -- 11/18  - The examined portion of the ileum was normal. - Granularity in the rectum. Biopsied. - Internal hemorrhoids. - The examination was otherwise normal on direct and retroflexion views. - Biopsies were taken with a cold forceps from the entire colon for evaluation of microscopic colitis.   Acute hypoxic respiratory failure -Resolved. Weaned to room air on afternoon of 11/17. Likely due to CHF and COPD exacerbation. Chest x-ray on admission which shows increasing pulmonary vascularity, bilateral pleural effusions. Requiring 4 liters per minute supplemental oxygen on admission. Not on oxygen supplementation at baseline. -- Management of CHF and COPD as above --Supplemental oxygen to maintain sats above 90%, wean as tolerated --Incentive spirometry   Sinus tachycardia -present on admission.   TSH mildly low at 0.230, normal free T4 at 0.86. --Treat underlying conditions as above --Telemetry --Metoprolol started on admission   Hyperlipidemia -on Crestor Fasting lipid panel: Cholesterol 129, HDL low 38, LDL 73, TGs 91    Patient BMI: Body mass index is 25.47 kg/m.   DVT prophylaxis:  Rivaroxaban (XARELTO) tablet 15 mg   Diet:  Diet Orders (From admission, onward)     Start     Ordered   02/03/21 0825  Diet Heart Room service appropriate? Yes; Fluid consistency: Thin; Fluid restriction: 1800 mL Fluid  Diet effective now       Question Answer Comment  Room service appropriate? Yes   Fluid consistency: Thin   Fluid restriction: 1800 mL Fluid      02/03/21 0824              Code  Status: DNR   Subjective 02/04/21    Patient overall states he feels well, better.  Slept well this morning.  His wife had to have their dog put to sleep last night, he is upset not to be there with her for that.  Looks forward to going home.   Disposition Plan & Communication   Status is: Inpatient  Remains inpatient appropriate because: Renal function worsened today, BP uncontrolled, medication changes underway.    Family Communication: Wife at bedside on rounds 11/18   Consults, Procedures, Significant Events   Consultants:  Cardiology Gastroenterology Nephrology  Procedures:  None  Antimicrobials:  Anti-infectives (From admission, onward)    None         Micro    Objective   Vitals:   02/04/21 0746 02/04/21 1132 02/04/21 1132 02/04/21 1704  BP: (!) 150/65 (!) 146/62 (!) 146/62 (!) 148/67  Pulse: 61 (!) 53 (!) 54 (!) 58  Resp: _0 Temp: 98.7 F (37.1 C) 98.3 F (36.8 C) 98.3 F (36.8 C) 98.7 F (37.1 C)  TempSrc:      SpO2: 96%  100% 99%  Weight:      Height:        Intake/Output Summary (Last 24 hours) at 02/04/2021 1829 Last data filed at 02/04/2021 1730 Gross per 24 hour  Intake 1200 ml  Output 1800 ml  Net -600 ml   Filed Weights   01/31/21 1007 02/01/21 0257 02/02/21 0538  Weight: 86.1  kg 87 kg 85.2 kg    Physical Exam:  General exam: awake sitting up in bed, alert, no acute distress,  Respiratory: lungs clear, on room air, normal respiratory effort at rest. Cardiovascular system: RRR, no peripheral edema Central nervous system: Alert and oriented x4, normal speech, grossly nonfocal exam Psychiatry: normal mood, congruent affect, judgement and insight appear normal  Labs   Data Reviewed: I have personally reviewed following labs and imaging studies  CBC: Recent Labs  Lab 01/29/21 1700 01/30/21 0410 01/30/21 0744 01/30/21 1459 01/31/21 0434 02/01/21 0442 02/02/21 0548 02/03/21 0336  WBC 8.9 4.1  --   --  15.1*  16.6* 13.9*  --   NEUTROABS 7.3  --   --   --   --   --   --   --   HGB 10.9* 9.9*   < > 9.6* 10.2* 9.7* 10.2* 10.2*  HCT 33.1* 30.3*   < > 28.6* 30.7* 29.5* 30.5* 30.7*  MCV 88.3 89.4  --   --  89.0 88.1 88.4  --   PLT 218 188  --   --  222 220 209  --    < > = values in this interval not displayed.   Basic Metabolic Panel: Recent Labs  Lab 01/29/21 1700 01/30/21 0410 01/31/21 0434 02/01/21 0442 02/02/21 0548 02/03/21 0336 02/04/21 0616  NA 139   < > 139 137 139 137 136  K 4.2   < > 4.6 4.7 4.1 3.9 4.0  CL 108   < > 107 103 106 103 102  CO2 21*   < > _0 GLUCOSE 104*   < > 128* 123* 112* 103* 103*  BUN 30*   < > 47* 50* 63* 58* 69*  CREATININE 2.23*   < > 2.46* 2.17* 2.36* 2.38* 2.90*  CALCIUM 9.1   < > 8.9 8.6* 8.7* 8.8* 8.7*  MG 2.0  --  2.1  --   --   --   --   PHOS  --   --  4.4  --   --   --   --    < > = values in this interval not displayed.   GFR: Estimated Creatinine Clearance: 26.4 mL/min (A) (by C-G formula based on SCr of 2.9 mg/dL (H)). Liver Function Tests: Recent Labs  Lab 01/29/21 1700 01/31/21 0434  AST 29 39  ALT 25 24  ALKPHOS 85 69  BILITOT 1.7* 1.0  PROT 7.9 7.2  ALBUMIN 4.6 4.0   No results for input(s): LIPASE, AMYLASE in the last 168 hours. No results for input(s): AMMONIA in the last 168 hours. Coagulation Profile: Recent Labs  Lab 01/29/21 1700  INR 1.9*   Cardiac Enzymes: No results for input(s): CKTOTAL, CKMB, CKMBINDEX, TROPONINI in the last 168 hours. BNP (last 3 results) No results for input(s): PROBNP in the last 8760 hours. HbA1C: No results for input(s): HGBA1C in the last 72 hours.  CBG: No results for input(s): GLUCAP in the last 168 hours. Lipid Profile: No results for input(s): CHOL, HDL, LDLCALC, TRIG, CHOLHDL, LDLDIRECT in the last 72 hours.  Thyroid Function Tests: No results for input(s): TSH, T4TOTAL, FREET4, T3FREE, THYROIDAB in the last 72 hours.  Anemia Panel: No results for input(s):  VITAMINB12, FOLATE, FERRITIN, TIBC, IRON, RETICCTPCT in the last 72 hours.  Sepsis Labs: No results for input(s): PROCALCITON, LATICACIDVEN in the last 168 hours.  Recent Results (from the past 240 hour(s))  Resp Panel  by RT-PCR (Flu A&B, Covid) Nasopharyngeal Swab     Status: None   Collection Time: 01/29/21  9:17 PM   Specimen: Nasopharyngeal Swab; Nasopharyngeal(NP) swabs in vial transport medium  Result Value Ref Range Status   SARS Coronavirus 2 by RT PCR NEGATIVE NEGATIVE Final    Comment: (NOTE) SARS-CoV-2 target nucleic acids are NOT DETECTED.  The SARS-CoV-2 RNA is generally detectable in upper respiratory specimens during the acute phase of infection. The lowest concentration of SARS-CoV-2 viral copies this assay can detect is 138 copies/mL. A negative result does not preclude SARS-Cov-2 infection and should not be used as the sole basis for treatment or other patient management decisions. A negative result may occur with  improper specimen collection/handling, submission of specimen other than nasopharyngeal swab, presence of viral mutation(s) within the areas targeted by this assay, and inadequate number of viral copies(<138 copies/mL). A negative result must be combined with clinical observations, patient history, and epidemiological information. The expected result is Negative.  Fact Sheet for Patients:  EntrepreneurPulse.com.au  Fact Sheet for Healthcare Providers:  IncredibleEmployment.be  This test is no t yet approved or cleared by the [place] FDA and  has been authorized for detection and/or diagnosis of SARS-CoV-2 by FDA under an Emergency Use Authorization (EUA). This EUA will remain  in effect (meaning this test can be used) for the duration of the COVID-19 declaration under Section 564(b)(1) of the Act, 21 U.S.C.section 360bbb-3(b)(1), unless the authorization is terminated  or revoked sooner.       Influenza A  by PCR NEGATIVE NEGATIVE Final   Influenza B by PCR NEGATIVE NEGATIVE Final    Comment: (NOTE) The Xpert Xpress SARS-CoV-2/FLU/RSV plus assay is intended as an aid in the diagnosis of influenza from Nasopharyngeal swab specimens and should not be used as a sole basis for treatment. Nasal washings and aspirates are unacceptable for Xpert Xpress SARS-CoV-2/FLU/RSV testing.  Fact Sheet for Patients: EntrepreneurPulse.com.au  Fact Sheet for Healthcare Providers: IncredibleEmployment.be  This test is not yet approved or cleared by the [place] FDA and has been authorized for detection and/or diagnosis of SARS-CoV-2 by FDA under an Emergency Use Authorization (EUA). This EUA will remain in effect (meaning this test can be used) for the duration of the COVID-19 declaration under Section 564(b)(1) of the Act, 21 U.S.C. section 360bbb-3(b)(1), unless the authorization is terminated or revoked.  Performed at [facility], [address] [place], [place] [address]   KOH prep     Status: None   Collection Time: 02/02/21  3:18 PM   Specimen: Esophagus  Result Value Ref Range Status   Specimen Description ESOPHAGUS  Final   Special Requests Normal  Final   KOH Prep   Final    YEAST WITH PSEUDOHYPHAE Performed at [facility], [address], [place], [place] [address]    Report Status 02/02/2021 FINAL  Final      Imaging Studies   CT ABDOMEN PELVIS WO CONTRAST  Result Date: 02/03/2021 CLINICAL DATA:  Hematuria, unknown cause. EXAM: CT ABDOMEN AND PELVIS WITHOUT CONTRAST TECHNIQUE: Multidetector CT imaging of the abdomen and pelvis was performed following the standard protocol without IV contrast. COMPARISON:  None. FINDINGS: Lower chest: Small right pleural effusion. Trace left pleural effusion. Associated passive atelectasis of bilateral lower lobes. Right middle lobe atelectasis. Coronary artery calcifications. Suggestion  of anemia given cardiac appearance. Hepatobiliary: Subcentimeter hypodensity too small to characterize. Otherwise no focal liver abnormality. Contracted gallbladder with query intraluminal stones or  sludge. No definite wall thickening or pericholecystic fluid. No biliary dilatation. Pancreas: No focal lesion. Normal pancreatic contour. No surrounding inflammatory changes. No main pancreatic ductal dilatation. Spleen: Normal in size without focal abnormality. Adrenals/Urinary Tract: No adrenal nodule bilaterally. Atrophic and scarred right kidney. Scarring of the left kidney. Calcifications associated with kidneys likely vascular. Nonspecific trace perinephric stranding bilaterally. No nephrolithiasis and no hydronephrosis. No definite contour-deforming renal mass. No ureterolithiasis or hydroureter. The urinary bladder is unremarkable. Stomach/Bowel: PO contrast reaches the terminal ileum. Stomach is within normal limits. No evidence of bowel wall thickening or dilatation. Scattered colonic diverticulosis. Appendix appears normal. Vascular/Lymphatic: Aortobifemoral artery bypass graft with markedly limited evaluation on this noncontrast study. No abdominal aorta or iliac aneurysm. Moderate to severe atherosclerotic plaque of the aorta and its branches. No abdominal, pelvic, or inguinal lymphadenopathy. Reproductive: Prostate is unremarkable. Other: No intraperitoneal free fluid. No intraperitoneal free gas. No organized fluid collection. Musculoskeletal: There is a tiny fat containing umbilical hernia with an abdominal defect of 0.5 cm. No suspicious lytic or blastic osseous lesions. No acute displaced fracture. Multilevel degenerative changes of the spine. IMPRESSION: 1. Small right and trace left pleural effusions. 2. Atrophic and scarred right kidney. Scarring of the left kidney. Markedly limited evaluation on this noncontrast study. 3. Scattered colonic diverticulosis with no acute diverticulitis. 4.  Contracted  gallbladder with query intraluminal stones or sludge. 5. Aortic Atherosclerosis (ICD10-I70.0). Aortobifemoral artery bypass graft with markedly limited evaluation on this noncontrast study. Electronically Signed   By: Iven Finn M.D.   On: 02/03/2021 20:00     Medications   Scheduled Meds:  amLODipine  10 mg Oral Daily   aspirin EC  81 mg Oral Daily   dextromethorphan-guaiFENesin  1 tablet Oral BID   hydrALAZINE  50 mg Oral Q8H   isosorbide dinitrate  10 mg Oral TID   [START ON 02/05/2021] metoprolol succinate  25 mg Oral Daily   metoprolol tartrate  12.5 mg Oral BID   nicotine  14 mg Transdermal Daily   pantoprazole (PROTONIX) IV  40 mg Intravenous Q24H   predniSONE  40 mg Oral Q breakfast   rivaroxaban  15 mg Oral Daily   rosuvastatin  20 mg Oral Daily   sodium chloride flush  3 mL Intravenous Q12H   Continuous Infusions:       LOS: 6 days    Time spent: 25 minutes with > 50% spent at bedside and in coordination of care     Ezekiel Slocumb, DO [facility]  02/04/2021, 6:29 PM      If 7PM-7AM, please contact night-coverage. How to contact the [facility] Attending or Consulting provider [place] or covering provider during after hours [place], for this patient?    Check the care team in [facility] and look for a) attending/consulting [facility] provider listed and b) the [facility] team listed Log into [email or web address] and use [place] universal password to access. If you do not have the password, please contact the hospital operator. Locate the [facility] provider you are looking for under [facility] and page to a number that you can be directly reached. If you still have difficulty reaching the provider, please page the [facility] (Director on Call) for the Hospitalists listed on amion for assistance.

## 2021-02-04 NOTE — Progress Notes (Signed)
[facility]  ROUNDING NOTE   Subjective:   Patient resting comfortably this morning. Denies any more orthopnea.   UOP 1747m.   Weaned off oxygen this morning.   Creatinine 2.9 (2.38)  Holding torsemide this morning and discontinued irbesartan which was started yesterday. Hydralazine dose increased Amlodipine also discontinued.     Objective:  Vital signs in last 24 hours:  Temp:  [97.7 F (36.5 C)-98.7 F (37.1 C)] 98.3 F (36.8 C) (11/20 1132) Pulse Rate:  [53-74] 54 (11/20 1132) Resp:  [17-20] 20 (11/20 1132) BP: (136-164)/(59-82) 146/62 (11/20 1132) SpO2:  [94 %-100 %] 100 % (11/20 1132)  Weight change:  Filed Weights   01/31/21 1007 02/01/21 0257 02/02/21 0538  Weight: 86.1 kg 87 kg 85.2 kg    Intake/Output: I/O last 3 completed shifts: In: 723 [P.O.:720; I.V.:3] Out: 2700 [Urine:2700]   Intake/Output this shift:  Total I/O In: 480 [P.O.:480] Out: 500 [Urine:500]  Physical Exam: General: NAD, laying in bed  Head: Normocephalic, atraumatic. Moist oral mucosal membranes  Eyes: Anicteric, PERRL  Neck: Supple, trachea midline  Lungs:  Faint wheeze on left, on room air  Heart: Regular rate and rhythm  Abdomen:  Soft, nontender,   Extremities:  no peripheral edema.  Neurologic: Nonfocal, moving all four extremities  Skin: No lesions        Basic Metabolic Panel: Recent Labs  Lab 01/29/21 1700 01/30/21 0410 01/31/21 0434 02/01/21 0442 02/02/21 0548 02/03/21 0336 02/04/21 0616  NA 139   < > 139 137 139 137 136  K 4.2   < > 4.6 4.7 4.1 3.9 4.0  CL 108   < > 107 103 106 103 102  CO2 21*   < > _0 GLUCOSE 104*   < > 128* 123* 112* 103* 103*  BUN 30*   < > 47* 50* 63* 58* 69*  CREATININE 2.23*   < > 2.46* 2.17* 2.36* 2.38* 2.90*  CALCIUM 9.1   < > 8.9 8.6* 8.7* 8.8* 8.7*  MG 2.0  --  2.1  --   --   --   --   PHOS  --   --  4.4  --   --   --   --    < > = values in this interval not displayed.     Liver Function  Tests: Recent Labs  Lab 01/29/21 1700 01/31/21 0434  AST 29 39  ALT 25 24  ALKPHOS 85 69  BILITOT 1.7* 1.0  PROT 7.9 7.2  ALBUMIN 4.6 4.0    No results for input(s): LIPASE, AMYLASE in the last 168 hours. No results for input(s): AMMONIA in the last 168 hours.  CBC: Recent Labs  Lab 01/29/21 1700 01/30/21 0410 01/30/21 0744 01/30/21 1459 01/31/21 0434 02/01/21 0442 02/02/21 0548 02/03/21 0336  WBC 8.9 4.1  --   --  15.1* 16.6* 13.9*  --   NEUTROABS 7.3  --   --   --   --   --   --   --   HGB 10.9* 9.9*   < > 9.6* 10.2* 9.7* 10.2* 10.2*  HCT 33.1* 30.3*   < > 28.6* 30.7* 29.5* 30.5* 30.7*  MCV 88.3 89.4  --   --  89.0 88.1 88.4  --   PLT 218 188  --   --  222 220 209  --    < > = values in this interval not displayed.     Cardiac  Enzymes: No results for input(s): CKTOTAL, CKMB, CKMBINDEX, TROPONINI in the last 168 hours.  BNP: Invalid input(s): POCBNP  CBG: No results for input(s): GLUCAP in the last 168 hours.  Microbiology: Results for orders placed or performed during the hospital encounter of 01/29/21  Resp Panel by RT-PCR (Flu A&B, Covid) Nasopharyngeal Swab     Status: None   Collection Time: 01/29/21  9:17 PM   Specimen: Nasopharyngeal Swab; Nasopharyngeal(NP) swabs in vial transport medium  Result Value Ref Range Status   SARS Coronavirus 2 by RT PCR NEGATIVE NEGATIVE Final    Comment: (NOTE) SARS-CoV-2 target nucleic acids are NOT DETECTED.  The SARS-CoV-2 RNA is generally detectable in upper respiratory specimens during the acute phase of infection. The lowest concentration of SARS-CoV-2 viral copies this assay can detect is 138 copies/mL. A negative result does not preclude SARS-Cov-2 infection and should not be used as the sole basis for treatment or other patient management decisions. A negative result may occur with  improper specimen collection/handling, submission of specimen other than nasopharyngeal swab, presence of viral mutation(s)  within the areas targeted by this assay, and inadequate number of viral copies(<138 copies/mL). A negative result must be combined with clinical observations, patient history, and epidemiological information. The expected result is Negative.  Fact Sheet for Patients:  EntrepreneurPulse.com.au  Fact Sheet for Healthcare Providers:  IncredibleEmployment.be  This test is no t yet approved or cleared by the [place] FDA and  has been authorized for detection and/or diagnosis of SARS-CoV-2 by FDA under an Emergency Use Authorization (EUA). This EUA will remain  in effect (meaning this test can be used) for the duration of the COVID-19 declaration under Section 564(b)(1) of the Act, 21 U.S.C.section 360bbb-3(b)(1), unless the authorization is terminated  or revoked sooner.       Influenza A by PCR NEGATIVE NEGATIVE Final   Influenza B by PCR NEGATIVE NEGATIVE Final    Comment: (NOTE) The Xpert Xpress SARS-CoV-2/FLU/RSV plus assay is intended as an aid in the diagnosis of influenza from Nasopharyngeal swab specimens and should not be used as a sole basis for treatment. Nasal washings and aspirates are unacceptable for Xpert Xpress SARS-CoV-2/FLU/RSV testing.  Fact Sheet for Patients: EntrepreneurPulse.com.au  Fact Sheet for Healthcare Providers: IncredibleEmployment.be  This test is not yet approved or cleared by the [place] FDA and has been authorized for detection and/or diagnosis of SARS-CoV-2 by FDA under an Emergency Use Authorization (EUA). This EUA will remain in effect (meaning this test can be used) for the duration of the COVID-19 declaration under Section 564(b)(1) of the Act, 21 U.S.C. section 360bbb-3(b)(1), unless the authorization is terminated or revoked.  Performed at [facility], [address] [place], [place] [address]   KOH prep     Status: None   Collection  Time: 02/02/21  3:18 PM   Specimen: Esophagus  Result Value Ref Range Status   Specimen Description ESOPHAGUS  Final   Special Requests Normal  Final   KOH Prep   Final    YEAST WITH PSEUDOHYPHAE Performed at [facility], [address] [place], [place] [address]    Report Status 02/02/2021 FINAL  Final    Coagulation Studies: No results for input(s): LABPROT, INR in the last 72 hours.   Urinalysis: No results for input(s): COLORURINE, LABSPEC, PHURINE, GLUCOSEU, HGBUR, BILIRUBINUR, KETONESUR, PROTEINUR, UROBILINOGEN, NITRITE, LEUKOCYTESUR in the last 72 hours.  Invalid input(s): APPERANCEUR     Imaging: CT ABDOMEN PELVIS WO CONTRAST  Result Date: 02/03/2021 CLINICAL DATA:  Hematuria, unknown cause. EXAM: CT ABDOMEN AND PELVIS WITHOUT CONTRAST TECHNIQUE: Multidetector CT imaging of the abdomen and pelvis was performed following the standard protocol without IV contrast. COMPARISON:  None. FINDINGS: Lower chest: Small right pleural effusion. Trace left pleural effusion. Associated passive atelectasis of bilateral lower lobes. Right middle lobe atelectasis. Coronary artery calcifications. Suggestion of anemia given cardiac appearance. Hepatobiliary: Subcentimeter hypodensity too small to characterize. Otherwise no focal liver abnormality. Contracted gallbladder with query intraluminal stones or sludge. No definite wall thickening or pericholecystic fluid. No biliary dilatation. Pancreas: No focal lesion. Normal pancreatic contour. No surrounding inflammatory changes. No main pancreatic ductal dilatation. Spleen: Normal in size without focal abnormality. Adrenals/Urinary Tract: No adrenal nodule bilaterally. Atrophic and scarred right kidney. Scarring of the left kidney. Calcifications associated with kidneys likely vascular. Nonspecific trace perinephric stranding bilaterally. No nephrolithiasis and no hydronephrosis. No definite contour-deforming renal mass. No ureterolithiasis  or hydroureter. The urinary bladder is unremarkable. Stomach/Bowel: PO contrast reaches the terminal ileum. Stomach is within normal limits. No evidence of bowel wall thickening or dilatation. Scattered colonic diverticulosis. Appendix appears normal. Vascular/Lymphatic: Aortobifemoral artery bypass graft with markedly limited evaluation on this noncontrast study. No abdominal aorta or iliac aneurysm. Moderate to severe atherosclerotic plaque of the aorta and its branches. No abdominal, pelvic, or inguinal lymphadenopathy. Reproductive: Prostate is unremarkable. Other: No intraperitoneal free fluid. No intraperitoneal free gas. No organized fluid collection. Musculoskeletal: There is a tiny fat containing umbilical hernia with an abdominal defect of 0.5 cm. No suspicious lytic or blastic osseous lesions. No acute displaced fracture. Multilevel degenerative changes of the spine. IMPRESSION: 1. Small right and trace left pleural effusions. 2. Atrophic and scarred right kidney. Scarring of the left kidney. Markedly limited evaluation on this noncontrast study. 3. Scattered colonic diverticulosis with no acute diverticulitis. 4.  Contracted gallbladder with query intraluminal stones or sludge. 5. Aortic Atherosclerosis (ICD10-I70.0). Aortobifemoral artery bypass graft with markedly limited evaluation on this noncontrast study. Electronically Signed   By: Iven Finn M.D.   On: 02/03/2021 20:00     Medications:      amLODipine  10 mg Oral Daily   aspirin EC  81 mg Oral Daily   dextromethorphan-guaiFENesin  1 tablet Oral BID   hydrALAZINE  50 mg Oral Q8H   metoprolol tartrate  12.5 mg Oral BID   nicotine  14 mg Transdermal Daily   pantoprazole (PROTONIX) IV  40 mg Intravenous Q24H   predniSONE  40 mg Oral Q breakfast   rivaroxaban  15 mg Oral Daily   rosuvastatin  20 mg Oral Daily   sodium chloride flush  3 mL Intravenous Q12H   acetaminophen **OR** acetaminophen, albuterol, hydrALAZINE, morphine  injection, ondansetron **OR** ondansetron (ZOFRAN) IV  Assessment/ Plan:  [name] is a 70 y.o. white male with COPD, hypertension, peripheral vascular disease, CVA, CO exposure, peptic ulcer disease, who was admitted to [facility] on 01/29/2021 for Respiratory distress [R06.03] Elevated troponin [R77.8] Acute respiratory failure with hypoxia ([place]) [T02.40] Acute systolic (congestive) heart failure (HCC) [I50.21] Acute on chronic congestive heart failure, unspecified heart failure type ([place]) [I50.9]   Acute kidney injury on chronic kidney disease stage IIIB: baseline creatinine of 1.8, GFR of 38 on 07/04/2020. Chronic kidney disease seems to be secondary to NSAID (years of using meloxicam and celecoxib). No IV contrast exposure. Renal ultrasound without obstruction but with atrophic right kidney - Pending serologic work up. ANA negative, anti-GBM negative, complements negative, UPEP  normal. Hepatitis viral screen negative - holding home olmesartan - holding torsemide which was initiated this admission.  - patient will need renal dopplers as outpatient.  - patient to avoid all NSAIDs going forward.    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 EF 25-20% with garde II diastolic dysfunction.  - Holding diuretics.  - Continue fluid restriction   Hypertension: 146/62- not at goal. Driven by COPD and systemic steroids. Prior to admission, home regimen included amlodipine 72m, olmesartan and hydrochlorothiazide - hydralazine and metoprolol started this admission - discussed low salt diet   Hematuria: patient has had hematuria on urinalysis in 2019. Ultrasound and CT with no etiology found.  - Recommend urology referral as outpatient.  - pending serologies. ANA negative, anti-GBM negative, complements negative, UPEP normal.  - waiting on ANCA   LOS: 6 Brandis Wixted 11/20/202212:30 PM

## 2021-02-05 ENCOUNTER — Encounter: Payer: Self-pay | Admitting: Gastroenterology

## 2021-02-05 DIAGNOSIS — R0602 Shortness of breath: Secondary | ICD-10-CM | POA: Diagnosis not present

## 2021-02-05 LAB — URINALYSIS, COMPLETE (UACMP) WITH MICROSCOPIC
Bacteria, UA: NONE SEEN
Bilirubin Urine: NEGATIVE
Glucose, UA: NEGATIVE mg/dL
Hgb urine dipstick: NEGATIVE
Ketones, ur: NEGATIVE mg/dL
Leukocytes,Ua: NEGATIVE
Nitrite: NEGATIVE
Protein, ur: NEGATIVE mg/dL
Specific Gravity, Urine: 1.018 (ref 1.005–1.030)
pH: 5 (ref 5.0–8.0)

## 2021-02-05 LAB — ANCA TITERS
Atypical P-ANCA titer: <1:20 {titer}
C-ANCA: <1:20 {titer}
P-ANCA: <1:20 {titer}

## 2021-02-05 LAB — PROTEIN ELECTROPHORESIS, SERUM
A/G Ratio: 1.2 (ref 0.7–1.7)
Albumin ELP: 3.3 g/dL (ref 2.9–4.4)
Alpha-1-Globulin: 0.3 g/dL (ref 0.0–0.4)
Alpha-2-Globulin: 0.8 g/dL (ref 0.4–1.0)
Beta Globulin: 0.9 g/dL (ref 0.7–1.3)
Gamma Globulin: 0.8 g/dL (ref 0.4–1.8)
Globulin, Total: 2.8 g/dL (ref 2.2–3.9)
Total Protein ELP: 6.1 g/dL (ref 6.0–8.5)

## 2021-02-05 LAB — BASIC METABOLIC PANEL
Anion gap: 12 (ref 5–15)
BUN: 79 mg/dL — ABNORMAL HIGH (ref 8–23)
CO2: 23 mmol/L (ref 22–32)
Calcium: 8.6 mg/dL — ABNORMAL LOW (ref 8.9–10.3)
Chloride: 100 mmol/L (ref 98–111)
Creatinine, Ser: 3.22 mg/dL — ABNORMAL HIGH (ref 0.61–1.24)
GFR, Estimated: 20 mL/min — ABNORMAL LOW (ref >60)
Glucose, Bld: 112 mg/dL — ABNORMAL HIGH (ref 70–99)
Potassium: 4 mmol/L (ref 3.5–5.1)
Sodium: 135 mmol/L (ref 135–145)

## 2021-02-05 LAB — HEMOGLOBIN AND HEMATOCRIT, BLOOD
HCT: 30.7 % — ABNORMAL LOW (ref 39.0–52.0)
Hemoglobin: 10.5 g/dL — ABNORMAL LOW (ref 13.0–17.0)

## 2021-02-05 MED ORDER — ISOSORBIDE DINITRATE 20 MG PO TABS
20.0000 mg | ORAL_TABLET | Freq: Three times a day (TID) | ORAL | Status: DC
  Administered 2021-02-05 (×3): 20 mg via ORAL
  Filled 2021-02-05 (×4): qty 1

## 2021-02-05 NOTE — Anesthesia Postprocedure Evaluation (Signed)
Anesthesia Post Note  Patient: Gary Mccoy.  Procedure(s) Performed: ESOPHAGOGASTRODUODENOSCOPY (EGD) WITH PROPOFOL COLONOSCOPY WITH PROPOFOL  Patient location during evaluation: PACU Anesthesia Type: General Level of consciousness: awake and alert Pain management: pain level controlled Vital Signs Assessment: post-procedure vital signs reviewed and stable Respiratory status: spontaneous breathing, nonlabored ventilation, respiratory function stable and patient connected to nasal cannula oxygen Cardiovascular status: blood pressure returned to baseline and stable Postop Assessment: no apparent nausea or vomiting Anesthetic complications: no   No notable events documented.   Last Vitals:  Vitals:   02/04/21 2342 02/05/21 0535  BP: (!) 145/79 140/71  Pulse: (!) 59 61  Resp: 18 18  Temp: 36.6 C (!) 36.4 C  SpO2: 100% 99%    Last Pain:  Vitals:   02/05/21 0535  TempSrc: Oral  PainSc: 0-No pain                 Precious Haws Elyas Villamor

## 2021-02-05 NOTE — Progress Notes (Signed)
[facility]    SUBJECTIVE:   - BP is improving.  - Cr increased again to 3.22 today.  - No shortness of breath or chest pain.    Vitals:   02/04/21 1704 02/04/21 2058 02/04/21 2342 02/05/21 0535  BP: (!) 148/67 124/77 (!) 145/79 140/71  Pulse: (!) 58 93 (!) 59 61  Resp: _0 Temp: 98.7 F (37.1 C) 98.1 F (36.7 C) 97.8 F (36.6 C) (!) 97.5 F (36.4 C)  TempSrc:  Oral Oral Oral  SpO2: 99% 97% 100% 99%  Weight:      Height:         Intake/Output Summary (Last 24 hours) at 02/05/2021 3428 Last data filed at 02/05/2021 0542 Gross per 24 hour  Intake 1683 ml  Output 2025 ml  Net -342 ml       PHYSICAL EXAM  General: Well developed, well nourished, in no acute distress HEENT:  Normocephalic and atramatic Neck:  No JVD.  Lungs: Diminished breath sounds clear bilaterally to auscultation and percussion. Heart: HRRR . Normal S1 and S2 without gallops or murmurs.  Abdomen: Bowel sounds are positive, abdomen soft and non-tender  Msk:  Back normal, normal gait. Normal strength and tone for age. Extremities: No clubbing, cyanosis or 2+edema.   Neuro: Alert and oriented X 3. Psych:  Good affect, responds appropriately   LABS: Basic Metabolic Panel: Recent Labs    02/04/21 0616 02/05/21 0610  NA 136 135  K 4.0 4.0  CL 102 100  CO2 25 23  GLUCOSE 103* 112*  BUN 69* 79*  CREATININE 2.90* 3.22*  CALCIUM 8.7* 8.6*    Liver Function Tests: No results for input(s): AST, ALT, ALKPHOS, BILITOT, PROT, ALBUMIN in the last 72 hours.  No results for input(s): LIPASE, AMYLASE in the last 72 hours. CBC: Recent Labs    02/03/21 0336 02/05/21 0610  HGB 10.2* 10.5*  HCT 30.7* 30.7*    Cardiac Enzymes: No results for input(s): CKTOTAL, CKMB, CKMBINDEX, TROPONINI in the last 72 hours. BNP: Invalid input(s): POCBNP D-Dimer: No results for input(s): DDIMER in the last 72 hours. Hemoglobin A1C: No results for input(s): HGBA1C in the last 72  hours.  Fasting Lipid Panel: No results for input(s): CHOL, HDL, LDLCALC, TRIG, CHOLHDL, LDLDIRECT in the last 72 hours.  Thyroid Function Tests: No results for input(s): TSH, T4TOTAL, T3FREE, THYROIDAB in the last 72 hours.  Invalid input(s): FREET3  Anemia Panel: No results for input(s): VITAMINB12, FOLATE, FERRITIN, TIBC, IRON, RETICCTPCT in the last 72 hours.   CT ABDOMEN PELVIS WO CONTRAST  Result Date: 02/03/2021 CLINICAL DATA:  Hematuria, unknown cause. EXAM: CT ABDOMEN AND PELVIS WITHOUT CONTRAST TECHNIQUE: Multidetector CT imaging of the abdomen and pelvis was performed following the standard protocol without IV contrast. COMPARISON:  None. FINDINGS: Lower chest: Small right pleural effusion. Trace left pleural effusion. Associated passive atelectasis of bilateral lower lobes. Right middle lobe atelectasis. Coronary artery calcifications. Suggestion of anemia given cardiac appearance. Hepatobiliary: Subcentimeter hypodensity too small to characterize. Otherwise no focal liver abnormality. Contracted gallbladder with query intraluminal stones or sludge. No definite wall thickening or pericholecystic fluid. No biliary dilatation. Pancreas: No focal lesion. Normal pancreatic contour. No surrounding inflammatory changes. No main pancreatic ductal dilatation. Spleen: Normal in size without focal abnormality. Adrenals/Urinary Tract: No adrenal nodule bilaterally. Atrophic and scarred right kidney. Scarring of the left kidney. Calcifications associated with kidneys likely vascular. Nonspecific trace perinephric stranding bilaterally. No nephrolithiasis and no hydronephrosis. No definite contour-deforming  renal mass. No ureterolithiasis or hydroureter. The urinary bladder is unremarkable. Stomach/Bowel: PO contrast reaches the terminal ileum. Stomach is within normal limits. No evidence of bowel wall thickening or dilatation. Scattered colonic diverticulosis. Appendix appears normal.  Vascular/Lymphatic: Aortobifemoral artery bypass graft with markedly limited evaluation on this noncontrast study. No abdominal aorta or iliac aneurysm. Moderate to severe atherosclerotic plaque of the aorta and its branches. No abdominal, pelvic, or inguinal lymphadenopathy. Reproductive: Prostate is unremarkable. Other: No intraperitoneal free fluid. No intraperitoneal free gas. No organized fluid collection. Musculoskeletal: There is a tiny fat containing umbilical hernia with an abdominal defect of 0.5 cm. No suspicious lytic or blastic osseous lesions. No acute displaced fracture. Multilevel degenerative changes of the spine. IMPRESSION: 1. Small right and trace left pleural effusions. 2. Atrophic and scarred right kidney. Scarring of the left kidney. Markedly limited evaluation on this noncontrast study. 3. Scattered colonic diverticulosis with no acute diverticulitis. 4.  Contracted gallbladder with query intraluminal stones or sludge. 5. Aortic Atherosclerosis (ICD10-I70.0). Aortobifemoral artery bypass graft with markedly limited evaluation on this noncontrast study. Electronically Signed   By: Iven Finn M.D.   On: 02/03/2021 20:00     Echo moderate to severely depressed left ventricular function at 25 to 30%  TELEMETRY: Sinus rhythm 85 nonspecific findings:  ASSESSMENT AND PLAN:  Principal Problem:   Shortness of breath Active Problems:   Benign essential HTN   Duodenal ulcer disease   Tobacco abuse counseling   Peripheral vascular disease (HCC)   Hyperlipidemia   CVA (cerebral vascular accident) ([place])   Acute respiratory failure with hypoxia (HCC)   Melena   Anemia   Acute systolic (congestive) heart failure ([place]) Demand ischemia this does not represent a non-STEMI  Plan Schmieder is a 70 year old male with history of CVA, PAD (on Xarelto following vascular surgery), HFrEF (previously 45-50, now 25 to 30%), hypertension, hyperlipidemia who was admitted with shortness of  breath.  Cardiology is consulted for elevated troponin in presence of pulmonary edema.  Additionally we are consulted for evaluation of newly reduced ejection fraction.  #Acute systolic heart failure #Acute renal insufficiency #Demand ischemia EF previously 45 to 50%, now decreased to 25 to 30%.  He was grossly volume overloaded on admission with pulmonary edema and has been improving with diuresis.  Troponin peak was 185.  Patient has renal insufficiency with a baseline creatinine of approximately 1.7-2, and is currently elevated to 3.2 - ARB initiated saturday with large bump in Cr.  -Hold diuresis with torsemide 20 mg again today. -Continue metoprolol 12.5 mg twice daily -Continue aspirin 81 mg daily -Continue Crestor 20 mg daily -We will need addition of afterload reduction.  Given renal insufficiency we will hold on initiating ACE or ARB.  Hydral 50 mg TID PO. Isordil increased to 20 mg TID -We will need ischemic evaluation.  Given his ongoing bleeding issues and renal insufficiency this can likely happen as an outpatient.   [name], MD 02/05/2021 7:09 AM

## 2021-02-05 NOTE — Progress Notes (Signed)
[facility]  ROUNDING NOTE   Subjective:   Doing fair.  Frustrated about his health conditions. Denies any acute complaints No shortness of breath    Objective:  Vital signs in last 24 hours:  Temp:  [97.5 F (36.4 C)-98.7 F (37.1 C)] 97.5 F (36.4 C) (11/21 0535) Pulse Rate:  [53-93] 61 (11/21 0535) Resp:  [17-20] 18 (11/21 0535) BP: (124-148)/(62-79) 140/71 (11/21 0535) SpO2:  [97 %-100 %] 99 % (11/21 0535)  Weight change:  Filed Weights   01/31/21 1007 02/01/21 0257 02/02/21 0538  Weight: 86.1 kg 87 kg 85.2 kg    Intake/Output: I/O last 3 completed shifts: In: 1683 [P.O.:1440; I.V.:3; Other:240] Out: 3125 [[id number]]   Intake/Output this shift:  No intake/output data recorded.  Physical Exam: Physical Exam: General:  No acute distress, laying in the bed  HEENT  anicteric, moist oral mucous membrane  Pulm/lungs  normal breathing effort, mild basilar crackles  CVS/Heart  regular rhythm, no rub or gallop  Abdomen:   Soft, nontender  Extremities:  No peripheral edema  Neurologic:  Alert, oriented, able to follow commands  Skin:  No acute rashes     Basic Metabolic Panel: Recent Labs  Lab 01/29/21 1700 01/30/21 0410 01/31/21 0434 02/01/21 0442 02/02/21 0548 02/03/21 0336 02/04/21 0616 02/05/21 0610  NA 139   < > 139 137 139 137 136 135  K 4.2   < > 4.6 4.7 4.1 3.9 4.0 4.0  CL 108   < > 107 103 106 103 102 100  CO2 21*   < > _0 GLUCOSE 104*   < > 128* 123* 112* 103* 103* 112*  BUN 30*   < > 47* 50* 63* 58* 69* 79*  CREATININE 2.23*   < > 2.46* 2.17* 2.36* 2.38* 2.90* 3.22*  CALCIUM 9.1   < > 8.9 8.6* 8.7* 8.8* 8.7* 8.6*  MG 2.0  --  2.1  --   --   --   --   --   PHOS  --   --  4.4  --   --   --   --   --    < > = values in this interval not displayed.     Liver Function Tests: Recent Labs  Lab 01/29/21 1700 01/31/21 0434  AST 29 39  ALT 25 24  ALKPHOS 85 69  BILITOT 1.7* 1.0  PROT 7.9 7.2  ALBUMIN 4.6 4.0     No results for input(s): LIPASE, AMYLASE in the last 168 hours. No results for input(s): AMMONIA in the last 168 hours.  CBC: Recent Labs  Lab 01/29/21 1700 01/30/21 0410 01/30/21 0744 01/31/21 0434 02/01/21 0442 02/02/21 0548 02/03/21 0336 02/05/21 0610  WBC 8.9 4.1  --  15.1* 16.6* 13.9*  --   --   NEUTROABS 7.3  --   --   --   --   --   --   --   HGB 10.9* 9.9*   < > 10.2* 9.7* 10.2* 10.2* 10.5*  HCT 33.1* 30.3*   < > 30.7* 29.5* 30.5* 30.7* 30.7*  MCV 88.3 89.4  --  89.0 88.1 88.4  --   --   PLT 218 188  --  222 220 209  --   --    < > = values in this interval not displayed.     Cardiac Enzymes: No results for input(s): CKTOTAL, CKMB, CKMBINDEX, TROPONINI in the last 168 hours.  BNP: Invalid input(s): POCBNP  CBG: No results for input(s): GLUCAP in the last 168 hours.  Microbiology: Results for orders placed or performed during the hospital encounter of 01/29/21  Resp Panel by RT-PCR (Flu A&B, Covid) Nasopharyngeal Swab     Status: None   Collection Time: 01/29/21  9:17 PM   Specimen: Nasopharyngeal Swab; Nasopharyngeal(NP) swabs in vial transport medium  Result Value Ref Range Status   SARS Coronavirus 2 by RT PCR NEGATIVE NEGATIVE Final    Comment: (NOTE) SARS-CoV-2 target nucleic acids are NOT DETECTED.  The SARS-CoV-2 RNA is generally detectable in upper respiratory specimens during the acute phase of infection. The lowest concentration of SARS-CoV-2 viral copies this assay can detect is 138 copies/mL. A negative result does not preclude SARS-Cov-2 infection and should not be used as the sole basis for treatment or other patient management decisions. A negative result may occur with  improper specimen collection/handling, submission of specimen other than nasopharyngeal swab, presence of viral mutation(s) within the areas targeted by this assay, and inadequate number of viral copies(<138 copies/mL). A negative result must be combined with clinical  observations, patient history, and epidemiological information. The expected result is Negative.  Fact Sheet for Patients:  EntrepreneurPulse.com.au  Fact Sheet for Healthcare Providers:  IncredibleEmployment.be  This test is no t yet approved or cleared by the [place] FDA and  has been authorized for detection and/or diagnosis of SARS-CoV-2 by FDA under an Emergency Use Authorization (EUA). This EUA will remain  in effect (meaning this test can be used) for the duration of the COVID-19 declaration under Section 564(b)(1) of the Act, 21 U.S.C.section 360bbb-3(b)(1), unless the authorization is terminated  or revoked sooner.       Influenza A by PCR NEGATIVE NEGATIVE Final   Influenza B by PCR NEGATIVE NEGATIVE Final    Comment: (NOTE) The Xpert Xpress SARS-CoV-2/FLU/RSV plus assay is intended as an aid in the diagnosis of influenza from Nasopharyngeal swab specimens and should not be used as a sole basis for treatment. Nasal washings and aspirates are unacceptable for Xpert Xpress SARS-CoV-2/FLU/RSV testing.  Fact Sheet for Patients: EntrepreneurPulse.com.au  Fact Sheet for Healthcare Providers: IncredibleEmployment.be  This test is not yet approved or cleared by the [place] FDA and has been authorized for detection and/or diagnosis of SARS-CoV-2 by FDA under an Emergency Use Authorization (EUA). This EUA will remain in effect (meaning this test can be used) for the duration of the COVID-19 declaration under Section 564(b)(1) of the Act, 21 U.S.C. section 360bbb-3(b)(1), unless the authorization is terminated or revoked.  Performed at [facility], [address] [place], [place] [address]   KOH prep     Status: None   Collection Time: 02/02/21  3:18 PM   Specimen: Esophagus  Result Value Ref Range Status   Specimen Description ESOPHAGUS  Final   Special Requests Normal   Final   KOH Prep   Final    YEAST WITH PSEUDOHYPHAE Performed at [facility], [address] [place], [place] [address]    Report Status 02/02/2021 FINAL  Final    Coagulation Studies: No results for input(s): LABPROT, INR in the last 72 hours.   Urinalysis: No results for input(s): COLORURINE, LABSPEC, PHURINE, GLUCOSEU, HGBUR, BILIRUBINUR, KETONESUR, PROTEINUR, UROBILINOGEN, NITRITE, LEUKOCYTESUR in the last 72 hours.  Invalid input(s): APPERANCEUR     Imaging: CT ABDOMEN PELVIS WO CONTRAST  Result Date: 02/03/2021 CLINICAL DATA:  Hematuria, unknown cause. EXAM: CT ABDOMEN AND PELVIS WITHOUT  CONTRAST TECHNIQUE: Multidetector CT imaging of the abdomen and pelvis was performed following the standard protocol without IV contrast. COMPARISON:  None. FINDINGS: Lower chest: Small right pleural effusion. Trace left pleural effusion. Associated passive atelectasis of bilateral lower lobes. Right middle lobe atelectasis. Coronary artery calcifications. Suggestion of anemia given cardiac appearance. Hepatobiliary: Subcentimeter hypodensity too small to characterize. Otherwise no focal liver abnormality. Contracted gallbladder with query intraluminal stones or sludge. No definite wall thickening or pericholecystic fluid. No biliary dilatation. Pancreas: No focal lesion. Normal pancreatic contour. No surrounding inflammatory changes. No main pancreatic ductal dilatation. Spleen: Normal in size without focal abnormality. Adrenals/Urinary Tract: No adrenal nodule bilaterally. Atrophic and scarred right kidney. Scarring of the left kidney. Calcifications associated with kidneys likely vascular. Nonspecific trace perinephric stranding bilaterally. No nephrolithiasis and no hydronephrosis. No definite contour-deforming renal mass. No ureterolithiasis or hydroureter. The urinary bladder is unremarkable. Stomach/Bowel: PO contrast reaches the terminal ileum. Stomach is within normal limits. No  evidence of bowel wall thickening or dilatation. Scattered colonic diverticulosis. Appendix appears normal. Vascular/Lymphatic: Aortobifemoral artery bypass graft with markedly limited evaluation on this noncontrast study. No abdominal aorta or iliac aneurysm. Moderate to severe atherosclerotic plaque of the aorta and its branches. No abdominal, pelvic, or inguinal lymphadenopathy. Reproductive: Prostate is unremarkable. Other: No intraperitoneal free fluid. No intraperitoneal free gas. No organized fluid collection. Musculoskeletal: There is a tiny fat containing umbilical hernia with an abdominal defect of 0.5 cm. No suspicious lytic or blastic osseous lesions. No acute displaced fracture. Multilevel degenerative changes of the spine. IMPRESSION: 1. Small right and trace left pleural effusions. 2. Atrophic and scarred right kidney. Scarring of the left kidney. Markedly limited evaluation on this noncontrast study. 3. Scattered colonic diverticulosis with no acute diverticulitis. 4.  Contracted gallbladder with query intraluminal stones or sludge. 5. Aortic Atherosclerosis (ICD10-I70.0). Aortobifemoral artery bypass graft with markedly limited evaluation on this noncontrast study. Electronically Signed   By: Iven Finn M.D.   On: 02/03/2021 20:00     Medications:      amLODipine  10 mg Oral Daily   aspirin EC  81 mg Oral Daily   dextromethorphan-guaiFENesin  1 tablet Oral BID   hydrALAZINE  50 mg Oral Q8H   isosorbide dinitrate  20 mg Oral TID   metoprolol succinate  25 mg Oral Daily   nicotine  14 mg Transdermal Daily   pantoprazole  40 mg Oral Daily   rivaroxaban  15 mg Oral Daily   rosuvastatin  20 mg Oral Daily   sodium chloride flush  3 mL Intravenous Q12H   acetaminophen **OR** acetaminophen, albuterol, hydrALAZINE, morphine injection, ondansetron **OR** ondansetron (ZOFRAN) IV  Assessment/ Plan:  [name] is a 70 y.o. white male with COPD, hypertension, peripheral  vascular disease with a history of femoral endarterectomy in 2017, CVA, CO exposure, peptic ulcer disease, who was admitted to [facility] on 01/29/2021 for Respiratory distress [R06.03] Elevated troponin [R77.8] Acute respiratory failure with hypoxia ([place]) [V69.79] Acute systolic (congestive) heart failure ([place]) [I50.21] Acute on chronic congestive heart failure, unspecified heart failure type ([place]) [I50.9]   Acute kidney injury on chronic kidney disease stage IIIB: baseline creatinine of 2.17, GFR of 32 on 02/01/21. Chronic kidney disease risk factors include NSAID (years of using meloxicam and celecoxib), severe vascular disease, HTN, ongoing smoking.   -Kidneys are noted to be atrophic on CT scan.  No IV contrast exposure. Renal ultrasound without obstruction but with atrophic right kidney - Pending serologic work up.  ANA negative, anti-GBM negative, complements negative, UPEP normal. Hepatitis viral screen negative -Creatinine increased today at 3.22, and increased BUN noted. -ARB olmesartan has been held. -Torsemide discontinued.   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 EF 25-20% with grade II diastolic dysfunction. (Echo nov 2022) - Holding diuretics.  - Continue reasonable fluid restriction   Hypertension:   Prior to admission, home regimen included amlodipine 42m, olmesartan and hydrochlorothiazide -Currently managed with hydralazine, isosorbide and metoprolol  - discussed low salt diet   Hematuria: patient has had hematuria on urinalysis in 2019. Ultrasound and CT with no etiology found.  - repeat U/A with M/E - ANA negative, anti-GBM negative, complements negative, UPEP normal.  - waiting on ANCA.     LOS: 7 Gary Mccoy 11/21/20229:41 AM

## 2021-02-05 NOTE — Plan of Care (Signed)
Patient slept well , no C/O pian, VSS, no BM, monitored closely Problem: Education: Goal: Knowledge of General Education information will improve Description: Including pain rating scale, medication(s)/side effects and non-pharmacologic comfort measures Outcome: Not Progressing   Problem: Health Behavior/Discharge Planning: Goal: Ability to manage health-related needs will improve Outcome: Not Progressing   Problem: Clinical Measurements: Goal: Ability to maintain clinical measurements within normal limits will improve Outcome: Not Progressing Goal: Will remain free from infection Outcome: Not Progressing Goal: Diagnostic test results will improve Outcome: Not Progressing Goal: Respiratory complications will improve Outcome: Not Progressing Goal: Cardiovascular complication will be avoided Outcome: Not Progressing

## 2021-02-05 NOTE — Care Management Important Message (Signed)
Important Message  Patient Details  Name: Gary Mccoy. MRN: [id number] Date of Birth: 07-02-1950   Medicare Important Message Given:  Yes     [name] 02/05/2021, 5:11 PM

## 2021-02-05 NOTE — Progress Notes (Signed)
PROGRESS NOTE    Jabaree A Phil Dopp.   YKD:[id number]  DOB: February 25, 1951  PCP: Rusty Aus, MD    DOA: 01/29/2021 LOS: 7    Brief Narrative / Hospital Course to Date:   70 y.o. male with past medical history significant of CVA, peripheral artery disease, placed on Xarelto by vascular surgery since 2017, chronic combined diastolic and systolic CHF, nonbleeding internal hemorrhoids, diverticulosis noted colonoscopy in 2019, gastritis, nonbleeding duodenal ulcer seen on EGD done in 2017, hyperlipidemia, essential hypertension, GERD who presented to [facility] ED on 01/29/21 with complaints of progressively worsening shortness of breath of 1 week duration, worse with exertion.  Also reported intermittent loose black stools for the past 6 weeks.  Denies use of NSAIDs.  Work-up in the ED revealed acute hypoxic respiratory failure secondary to acute CHF with concern for pulmonary edema.  Elevated troponin, was started on heparin drip and his home Xarelto was held.  Labs also revealed iron deficiency anemia.   Admitted to [facility] service.  Cardiology and GI consulted.  Assessment & Plan   Principal Problem:   Shortness of breath Active Problems:   Benign essential HTN   Duodenal ulcer disease   Tobacco abuse counseling   Peripheral vascular disease ([place])   Hyperlipidemia   CVA (cerebral vascular accident) ([place])   Acute respiratory failure with hypoxia (HCC)   Melena   Anemia   Acute systolic (congestive) heart failure (HCC)   Acute on chronic combined diastolic and systolic CHF - Presented with progressive dyspnea with minimal exertion, elevated BNP >1000, pulmonary edema seen on chest x-ray. Last Echo in 2019 revealed LVEF 45 to 50% with grade 1 diastolic dysfunction. Echo on 01/30/2021 showed LVEF 25 to 30%, left ventricle shows global hypokinesis, grade 2 diastolic dysfunction. --Cardiology consulted, appreciate recommendations --Hold torsemide with rising Cr --Started on Lopressor 12.5  mg p.o. twice daily --Hydralazine added, increased --Isordil added 11/20 --Continue aspirin and statin --Strict I/O's and daily weights --Monitor renal function and electrolytes closely with diuresis --Needs ischemic evaluation, likely as outpatient.  Cardiac cath precluded by renal failure.  Acute respiratory failure with hypoxia due to below - no baseline oxygen requirement, was needing 4 L/min.  Resolved, weaned to room air. Acute exacerbation of suspected COPD versus bronchitis - Improving.  Off oxygen. Patient is a 50-year smoker and has poor aeration and diffuse expiratory wheezing on exam consistent with exacerbated obstructive pulmonary disease.  Noted to have a productive sounding cough. --Completed prednisone 11/20.  No longer wheezing and has much improved air movement. --Scheduled Mucinex --As needed albuterol  --Recommend formal PFTs as outpatient --Supplement oxygen as needed to maintain sats above 90%    Elevated troponin due to suspect demand ischemia -likely secondary to hypoxia from pulmonary edema.  Troponin on presentation 126, peaked at 145 and downtrending. No acute ischemic changes on EKG.  No active chest pain --Xarelto held on admission, patient was on heparin drip -- Xarelto has been resumed, renally dosed 15 mg daily --Telemetry --Cardiology following   AKI on CKD 3B - likely prerenal azotemia due to dehydration from poor p.o. intake Baseline creatinine appears to be 1.6.  Presented with creatinine 2.36. 11/20: Cr rose to 2.9 after resuming ARB, on diuretic 11/21: Cr further increased to 3.22 --Nephrology consulted  --Hold ARB and torsemide --Avoid nephrotoxic agents and hypotension --Monitor renal function closely  --Monitor urine output --Follow-up pending SPEP/UPEP and hepatitis panel   Suspected upper GI bleed (clinically undetermined if due to [facility]) /  Hx of intermittent melena Iron deficiency anemia History of gastritis/nonbleeding duodenal  ulcer seen on EGD in 2017. History of nonbleeding internal hemorrhoids / diverticulosis seen on colonoscopy done in 2019. Iron deficiency anemia likely secondary to chronic blood loss.  Hemoglobin on admission 9.9 --Trend hemoglobin, transfuse if less than 8.0 --Change to PO Protonix --GI consulted EGD -- 11/18  - White nummular lesions in esophageal mucosa.  Brushings performed. - Normal stomach. - Normal examined duodenum. Colonoscopy -- 11/18  - The examined portion of the ileum was normal. - Granularity in the rectum. Biopsied. - Internal hemorrhoids. - The examination was otherwise normal on direct and retroflexion views. - Biopsies were taken with a cold forceps from the entire colon for evaluation of microscopic colitis.   Acute hypoxic respiratory failure -Resolved. Weaned to room air on afternoon of 11/17. Likely due to CHF and COPD exacerbation. Chest x-ray on admission which shows increasing pulmonary vascularity, bilateral pleural effusions. Requiring 4 liters per minute supplemental oxygen on admission. Not on oxygen supplementation at baseline. --Management of CHF and COPD as above --Supplemental oxygen to maintain sats above 90%, wean as tolerated --Incentive spirometry   Sinus tachycardia -present on admission.   TSH mildly low at 0.230, normal free T4 at 0.86. --Treat underlying conditions as above --Telemetry --Metoprolol started on admission   Hyperlipidemia -on Crestor Fasting lipid panel: Cholesterol 129, HDL low 38, LDL 73, TGs 91    Patient BMI: Body mass index is 25.47 kg/m.   DVT prophylaxis:  Rivaroxaban (XARELTO) tablet 15 mg   Diet:  Diet Orders (From admission, onward)     Start     Ordered   02/03/21 0825  Diet Heart Room service appropriate? Yes; Fluid consistency: Thin; Fluid restriction: 1800 mL Fluid  Diet effective now       Question Answer Comment  Room service appropriate? Yes   Fluid consistency: Thin   Fluid restriction: 1800 mL  Fluid      02/03/21 0824              Code Status: DNR   Subjective 02/05/21    Patient reports overall feeling well.  He was hopeful to go home but understanding of reasons to stay another night including blood pressure and watching renal function.  He expresses some frustration about his health issues and talks about the extensive vascular disease history in his family and his personal choices throughout life.  Comments on enjoying quality of life and being less concerned about quantity.   Disposition Plan & Communication   Status is: Inpatient  Remains inpatient appropriate because: Renal function worsened today, medication changes underway to warrant close monitoring.    Family Communication: Wife at bedside on rounds 11/18   Consults, Procedures, Significant Events   Consultants:  Cardiology Gastroenterology Nephrology  Procedures:  None  Antimicrobials:  Anti-infectives (From admission, onward)    None         Micro    Objective   Vitals:   02/04/21 2058 02/04/21 2342 02/05/21 0535 02/05/21 1224  BP: 124/77 (!) 145/79 140/71 136/68  Pulse: 93 (!) 59 61 61  Resp: _0 Temp: 98.1 F (36.7 C) 97.8 F (36.6 C) (!) 97.5 F (36.4 C) (!) 97.3 F (36.3 C)  TempSrc: Oral Oral Oral   SpO2: 97% 100% 99% 100%  Weight:      Height:        Intake/Output Summary (Last 24 hours) at 02/05/2021 1503 Last data filed at  02/05/2021 1404 Gross per 24 hour  Intake 1323 ml  Output 1525 ml  Net -202 ml   Filed Weights   01/31/21 1007 02/01/21 0257 02/02/21 0538  Weight: 86.1 kg 87 kg 85.2 kg    Physical Exam:  General exam: Awake, sitting up in bed, alert, no acute distress Respiratory: Lungs clear bilaterally with improved aeration, no wheezes or rhonchi, normal respiratory effort at rest, on room air. Cardiovascular system: Regular rate and rhythm, no peripheral edema Central nervous system: Alert and oriented x4, normal speech, grossly  nonfocal exam Psychiatry: normal mood, congruent affect, judgement and insight appear normal  Labs   Data Reviewed: I have personally reviewed following labs and imaging studies  CBC: Recent Labs  Lab 01/29/21 1700 01/30/21 0410 01/30/21 0744 01/31/21 0434 02/01/21 0442 02/02/21 0548 02/03/21 0336 02/05/21 0610  WBC 8.9 4.1  --  15.1* 16.6* 13.9*  --   --   NEUTROABS 7.3  --   --   --   --   --   --   --   HGB 10.9* 9.9*   < > 10.2* 9.7* 10.2* 10.2* 10.5*  HCT 33.1* 30.3*   < > 30.7* 29.5* 30.5* 30.7* 30.7*  MCV 88.3 89.4  --  89.0 88.1 88.4  --   --   PLT 218 188  --  222 220 209  --   --    < > = values in this interval not displayed.   Basic Metabolic Panel: Recent Labs  Lab 01/29/21 1700 01/30/21 0410 01/31/21 0434 02/01/21 0442 02/02/21 0548 02/03/21 0336 02/04/21 0616 02/05/21 0610  NA 139   < > 139 137 139 137 136 135  K 4.2   < > 4.6 4.7 4.1 3.9 4.0 4.0  CL 108   < > 107 103 106 103 102 100  CO2 21*   < > _0 GLUCOSE 104*   < > 128* 123* 112* 103* 103* 112*  BUN 30*   < > 47* 50* 63* 58* 69* 79*  CREATININE 2.23*   < > 2.46* 2.17* 2.36* 2.38* 2.90* 3.22*  CALCIUM 9.1   < > 8.9 8.6* 8.7* 8.8* 8.7* 8.6*  MG 2.0  --  2.1  --   --   --   --   --   PHOS  --   --  4.4  --   --   --   --   --    < > = values in this interval not displayed.   GFR: Estimated Creatinine Clearance: 23.8 mL/min (A) (by C-G formula based on SCr of 3.22 mg/dL (H)). Liver Function Tests: Recent Labs  Lab 01/29/21 1700 01/31/21 0434  AST 29 39  ALT 25 24  ALKPHOS 85 69  BILITOT 1.7* 1.0  PROT 7.9 7.2  ALBUMIN 4.6 4.0   No results for input(s): LIPASE, AMYLASE in the last 168 hours. No results for input(s): AMMONIA in the last 168 hours. Coagulation Profile: Recent Labs  Lab 01/29/21 1700  INR 1.9*   Cardiac Enzymes: No results for input(s): CKTOTAL, CKMB, CKMBINDEX, TROPONINI in the last 168 hours. BNP (last 3 results) No results for input(s): PROBNP in  the last 8760 hours. HbA1C: No results for input(s): HGBA1C in the last 72 hours.  CBG: No results for input(s): GLUCAP in the last 168 hours. Lipid Profile: No results for input(s): CHOL, HDL, LDLCALC, TRIG, CHOLHDL, LDLDIRECT in the last 72 hours.  Thyroid  Function Tests: No results for input(s): TSH, T4TOTAL, FREET4, T3FREE, THYROIDAB in the last 72 hours.  Anemia Panel: No results for input(s): VITAMINB12, FOLATE, FERRITIN, TIBC, IRON, RETICCTPCT in the last 72 hours.  Sepsis Labs: No results for input(s): PROCALCITON, LATICACIDVEN in the last 168 hours.  Recent Results (from the past 240 hour(s))  Resp Panel by RT-PCR (Flu A&B, Covid) Nasopharyngeal Swab     Status: None   Collection Time: 01/29/21  9:17 PM   Specimen: Nasopharyngeal Swab; Nasopharyngeal(NP) swabs in vial transport medium  Result Value Ref Range Status   SARS Coronavirus 2 by RT PCR NEGATIVE NEGATIVE Final    Comment: (NOTE) SARS-CoV-2 target nucleic acids are NOT DETECTED.  The SARS-CoV-2 RNA is generally detectable in upper respiratory specimens during the acute phase of infection. The lowest concentration of SARS-CoV-2 viral copies this assay can detect is 138 copies/mL. A negative result does not preclude SARS-Cov-2 infection and should not be used as the sole basis for treatment or other patient management decisions. A negative result may occur with  improper specimen collection/handling, submission of specimen other than nasopharyngeal swab, presence of viral mutation(s) within the areas targeted by this assay, and inadequate number of viral copies(<138 copies/mL). A negative result must be combined with clinical observations, patient history, and epidemiological information. The expected result is Negative.  Fact Sheet for Patients:  EntrepreneurPulse.com.au  Fact Sheet for Healthcare Providers:  IncredibleEmployment.be  This test is no t yet approved or  cleared by the [place] FDA and  has been authorized for detection and/or diagnosis of SARS-CoV-2 by FDA under an Emergency Use Authorization (EUA). This EUA will remain  in effect (meaning this test can be used) for the duration of the COVID-19 declaration under Section 564(b)(1) of the Act, 21 U.S.C.section 360bbb-3(b)(1), unless the authorization is terminated  or revoked sooner.       Influenza A by PCR NEGATIVE NEGATIVE Final   Influenza B by PCR NEGATIVE NEGATIVE Final    Comment: (NOTE) The Xpert Xpress SARS-CoV-2/FLU/RSV plus assay is intended as an aid in the diagnosis of influenza from Nasopharyngeal swab specimens and should not be used as a sole basis for treatment. Nasal washings and aspirates are unacceptable for Xpert Xpress SARS-CoV-2/FLU/RSV testing.  Fact Sheet for Patients: EntrepreneurPulse.com.au  Fact Sheet for Healthcare Providers: IncredibleEmployment.be  This test is not yet approved or cleared by the [place] FDA and has been authorized for detection and/or diagnosis of SARS-CoV-2 by FDA under an Emergency Use Authorization (EUA). This EUA will remain in effect (meaning this test can be used) for the duration of the COVID-19 declaration under Section 564(b)(1) of the Act, 21 U.S.C. section 360bbb-3(b)(1), unless the authorization is terminated or revoked.  Performed at [facility], [address] [place], [place] [address]   KOH prep     Status: None   Collection Time: 02/02/21  3:18 PM   Specimen: Esophagus  Result Value Ref Range Status   Specimen Description ESOPHAGUS  Final   Special Requests Normal  Final   KOH Prep   Final    YEAST WITH PSEUDOHYPHAE Performed at [facility], [address], [place], [place] [address]    Report Status 02/02/2021 FINAL  Final      Imaging Studies   No results found.   Medications   Scheduled Meds:  amLODipine  10 mg Oral Daily    aspirin EC  81 mg Oral Daily   dextromethorphan-guaiFENesin  1 tablet Oral BID  hydrALAZINE  50 mg Oral Q8H   isosorbide dinitrate  20 mg Oral TID   metoprolol succinate  25 mg Oral Daily   nicotine  14 mg Transdermal Daily   pantoprazole  40 mg Oral Daily   rivaroxaban  15 mg Oral Daily   rosuvastatin  20 mg Oral Daily   sodium chloride flush  3 mL Intravenous Q12H   Continuous Infusions:       LOS: 7 days    Time spent: 30 minutes with > 50% spent at bedside and in coordination of care     Ezekiel Slocumb, DO [facility]  02/05/2021, 3:03 PM      If 7PM-7AM, please contact night-coverage. How to contact the [facility] Attending or Consulting provider [place] or covering provider during after hours [place], for this patient?    Check the care team in [facility] and look for a) attending/consulting [facility] provider listed and b) the [facility] team listed Log into [email or web address] and use [place]'s universal password to access. If you do not have the password, please contact the hospital operator. Locate the [facility] provider you are looking for under [facility] and page to a number that you can be directly reached. If you still have difficulty reaching the provider, please page the [facility] (Director on Call) for the Hospitalists listed on amion for assistance.

## 2021-02-06 ENCOUNTER — Ambulatory Visit: Admission: RE | Admit: 2021-02-06 | Payer: Medicare HMO | Source: Ambulatory Visit

## 2021-02-06 DIAGNOSIS — R0602 Shortness of breath: Secondary | ICD-10-CM | POA: Diagnosis not present

## 2021-02-06 LAB — BASIC METABOLIC PANEL
Anion gap: 8 (ref 5–15)
BUN: 80 mg/dL — ABNORMAL HIGH (ref 8–23)
CO2: 23 mmol/L (ref 22–32)
Calcium: 8.2 mg/dL — ABNORMAL LOW (ref 8.9–10.3)
Chloride: 104 mmol/L (ref 98–111)
Creatinine, Ser: 3.09 mg/dL — ABNORMAL HIGH (ref 0.61–1.24)
GFR, Estimated: 21 mL/min — ABNORMAL LOW (ref >60)
Glucose, Bld: 83 mg/dL (ref 70–99)
Potassium: 4.1 mmol/L (ref 3.5–5.1)
Sodium: 135 mmol/L (ref 135–145)

## 2021-02-06 LAB — SURGICAL PATHOLOGY

## 2021-02-06 MED ORDER — AMLODIPINE BESYLATE 10 MG PO TABS: 10.0000 mg | ORAL_TABLET | Freq: Every day | ORAL | 2 refills | Status: DC

## 2021-02-06 MED ORDER — ISOSORBIDE MONONITRATE ER 60 MG PO TB24
60.0000 mg | ORAL_TABLET | Freq: Every day | ORAL | Status: DC
  Administered 2021-02-06: 60 mg via ORAL
  Filled 2021-02-06: qty 1

## 2021-02-06 MED ORDER — METOPROLOL SUCCINATE ER 25 MG PO TB24: 25.0000 mg | ORAL_TABLET | Freq: Every day | ORAL | 2 refills | Status: DC

## 2021-02-06 MED ORDER — ISOSORBIDE MONONITRATE ER 60 MG PO TB24: 60.0000 mg | ORAL_TABLET | Freq: Every day | ORAL | 2 refills | Status: DC

## 2021-02-06 MED ORDER — FUROSEMIDE 20 MG PO TABS: 40.0000 mg | ORAL_TABLET | Freq: Every day | ORAL | 2 refills | Status: DC | PRN

## 2021-02-06 MED ORDER — HYDRALAZINE HCL 50 MG PO TABS
50.0000 mg | ORAL_TABLET | Freq: Three times a day (TID) | ORAL | 2 refills | Status: DC
Start: 2021-02-06 — End: 2021-11-23

## 2021-02-06 MED ORDER — RIVAROXABAN 15 MG PO TABS: 15.0000 mg | ORAL_TABLET | Freq: Every day | ORAL | 2 refills | Status: DC

## 2021-02-06 MED ORDER — ALBUTEROL SULFATE HFA 108 (90 BASE) MCG/ACT IN AERS: 2.0000 | INHALATION_SPRAY | Freq: Four times a day (QID) | RESPIRATORY_TRACT | 2 refills | Status: DC | PRN

## 2021-02-06 NOTE — Discharge Summary (Signed)
Physician Discharge Summary  Stirling A Phil Dopp. JFH:[id number] DOB: 04/30/1950 DOA: 01/29/2021  PCP: Rusty Aus, MD  Admit date: 01/29/2021 Discharge date: 02/06/2021  Admitted From: home Disposition:  home  Recommendations for Outpatient Follow-up:  Follow up with PCP in 1-2 weeks Please obtain BMP/CBC in one week Please follow up with Cardiology Please follow up with Nephrology  Home Health: no  Equipment/Devices: none   Discharge Condition: stable  CODE STATUS: DNR  Diet recommendation: Heart Healthy     Discharge Diagnoses: Principal Problem:   Shortness of breath Active Problems:   Benign essential HTN   Duodenal ulcer disease   Tobacco abuse counseling   Peripheral vascular disease ([place])   Hyperlipidemia   CVA (cerebral vascular accident) ([place])   Acute respiratory failure with hypoxia ([place])   Melena   Anemia   Acute systolic (congestive) heart failure ([place])    Summary of HPI and Hospital Course:  70 y.o. male with past medical history significant of CVA, peripheral artery disease, placed on Xarelto by vascular surgery since 2017, chronic combined diastolic and systolic CHF, nonbleeding internal hemorrhoids, diverticulosis noted colonoscopy in 2019, gastritis, nonbleeding duodenal ulcer seen on EGD done in 2017, hyperlipidemia, essential hypertension, GERD who presented to [facility] ED on 01/29/21 with complaints of progressively worsening shortness of breath of 1 week duration, worse with exertion.  Also reported intermittent loose black stools for the past 6 weeks.  Denies use of NSAIDs.  Work-up in the ED revealed acute hypoxic respiratory failure secondary to acute CHF with concern for pulmonary edema.  Elevated troponin, was started on heparin drip and his home Xarelto was held.  Labs also revealed iron deficiency anemia.   Admitted to [facility] service.  Cardiology and GI consulted.    Acute on chronic combined diastolic and systolic CHF - Presented with  progressive dyspnea with minimal exertion, elevated BNP >1000, pulmonary edema seen on chest x-ray. Last Echo in 2019 revealed LVEF 45 to 50% with grade 1 diastolic dysfunction. Echo on 01/30/2021 showed LVEF 25 to 30%, left ventricle shows global hypokinesis, grade 2 diastolic dysfunction. --Cardiology consulted, appreciate recommendations --Hold torsemide with rising Cr --Started on Lopressor 12.5 mg p.o. twice daily --Hydralazine added, increased --Isordil added 11/20 --Continue aspirin and statin --Strict I/O's and daily weights --Monitor renal function and electrolytes closely with diuresis --Needs ischemic evaluation, likely as outpatient.  Cardiac cath precluded by renal failure.   Acute respiratory failure with hypoxia due to below - no baseline oxygen requirement, was needing 4 L/min.  Resolved, weaned to room air. Acute exacerbation of suspected COPD versus bronchitis - Improving.  Off oxygen. Patient is a 50-year smoker and has poor aeration and diffuse expiratory wheezing on exam consistent with exacerbated obstructive pulmonary disease.  Noted to have a productive sounding cough. --Completed prednisone 11/20.  No longer wheezing and has much improved air movement. --Scheduled Mucinex --As needed albuterol  --Recommend formal PFTs as outpatient --Supplement oxygen as needed to maintain sats above 90%    Elevated troponin due to suspect demand ischemia -likely secondary to hypoxia from pulmonary edema.  Troponin on presentation 126, peaked at 145 and downtrending. No acute ischemic changes on EKG.  No active chest pain --Xarelto held on admission, patient was on heparin drip -- Xarelto has been resumed, renally dosed 15 mg daily --Telemetry --Cardiology following   AKI on CKD 3B - likely prerenal azotemia due to dehydration from poor p.o. intake Baseline creatinine appears to be 1.6.  Presented with creatinine 2.36.  11/20: Cr rose to 2.9 after resuming ARB, on  diuretic 11/21: Cr further increased to 3.22 --Nephrology consulted  --Hold ARB and torsemide --Avoid nephrotoxic agents and hypotension --Monitor renal function closely  --Monitor urine output --Follow-up pending SPEP/UPEP and hepatitis panel   Suspected upper GI bleed (clinically undetermined if due to [facility]) / Hx of intermittent melena Iron deficiency anemia History of gastritis/nonbleeding duodenal ulcer seen on EGD in 2017. History of nonbleeding internal hemorrhoids / diverticulosis seen on colonoscopy done in 2019. Iron deficiency anemia likely secondary to chronic blood loss.  Hemoglobin on admission 9.9 --Trend hemoglobin, transfuse if less than 8.0 --PO Protonix --GI consulted EGD -- 11/18  - White nummular lesions in esophageal mucosa.  Brushings performed. - Normal stomach. - Normal examined duodenum. Colonoscopy -- 11/18  - The examined portion of the ileum was normal. - Granularity in the rectum. Biopsied. - Internal hemorrhoids. - The examination was otherwise normal on direct and retroflexion views. - Biopsies were taken with a cold forceps from the entire colon for evaluation of microscopic colitis.   Acute hypoxic respiratory failure -Resolved. Weaned to room air on afternoon of 11/17. Likely due to CHF and COPD exacerbation. Chest x-ray on admission with increasing pulmonary vascularity, bilateral pleural effusions. Requiring 4 liters per minute supplemental oxygen on admission. Not on oxygen supplementation at baseline. --Management of CHF and COPD as above --Incentive spirometry   Sinus tachycardia -present on admission.  Resolved. TSH mildly low at 0.230, normal free T4 at 0.86. --Treat underlying conditions as above --Metoprolol started on admission   Hyperlipidemia -on Crestor Fasting lipid panel: Cholesterol 129, HDL low 38, LDL 73, TGs 91     Discharge Instructions   Discharge Instructions     (HEART FAILURE PATIENTS) Call MD:  Anytime you  have any of the following symptoms: 1) 3 pound weight gain in 24 hours or 5 pounds in 1 week 2) shortness of breath, with or without a dry hacking cough 3) swelling in the hands, feet or stomach 4) if you have to sleep on extra pillows at night in order to breathe.   Complete by: As directed    Call MD for:   Complete by: As directed    Worsening shortness or breath or wheezing. Making less urine than usual. Persistently elevated Blood Pressures or LOW Blood pressures (top # below 100 or bottom # below 60)   Call MD for:  extreme fatigue   Complete by: As directed    Call MD for:  persistant dizziness or light-headedness   Complete by: As directed    Call MD for:  persistant nausea and vomiting   Complete by: As directed    Call MD for:  severe uncontrolled pain   Complete by: As directed    Call MD for:  temperature >100.4   Complete by: As directed    Diet - low sodium heart healthy   Complete by: As directed    Discharge instructions   Complete by: As directed    Please take all medications as prescribed and be sure to follow up with cardiology and nephrology, in addition to your Primary Care doctor.  We like patients to see Primary Care within 1-2 weeks of being in the hospital, so please call to make an appointment.    Cardiology - you can follow up with either Dr. Clayborn Bigness or Dr. Corky Sox - I would schedule with whoever can see you sooner.  Both are with [facility]  Nephrology - follow  up with either Dr. Juleen China or Dr. Candiss Norse.  Again, would schedule with whoever can see you sooner.  BLOOD PRESSURE -- please check BP at home 1-2 times each day and write down readings to bring to follow up doctor appointments.  If it's consistently running high, especially if TOP # above 160 or BOTTOM # above 90, please call your doctor for instructions and medication changes.  BREATHING -- You do probably have COPD.  The best thing you can do to improve breathing is stop smoking.  If you are  interested in trying to stop, please talk about it with Primary Care.  Nicotine patches or gum can help, but sometimes we prescribe medication that helps to reduce nicotine cravings.  I sent an albuterol inhaler prescription to your pharmacy.  Use that if you are feeling chest tightness, worse shortness of breath or wheezing.   --Dr. Nicole Kindred, DO   [facility]   [facility]   Increase activity slowly   Complete by: As directed       Allergies as of 02/06/2021   No Known Allergies      Medication List     STOP taking these medications    celecoxib 200 MG capsule Commonly known as: CELEBREX   cephALEXin 500 MG capsule Commonly known as: Keflex   olmesartan-hydrochlorothiazide 20-12.5 MG tablet Commonly known as: BENICAR HCT   predniSONE 10 MG tablet Commonly known as: DELTASONE       TAKE these medications    albuterol 108 (90 Base) MCG/ACT inhaler Commonly known as: VENTOLIN HFA Inhale 2 puffs into the lungs every 6 (six) hours as needed for wheezing or shortness of breath.   amLODipine 10 MG tablet Commonly known as: NORVASC Take 1 tablet (10 mg total) by mouth daily. Start taking on: February 07, 2021 What changed:  medication strength how much to take when to take this   aspirin EC 81 MG tablet Take 1 oz by mouth daily.   CoQ10 100 MG Caps Take by mouth daily.   FISH OIL PO Take by mouth.   furosemide 20 MG tablet Commonly known as: Lasix Take 2 tablets (40 mg total) by mouth daily as needed. for weight gain or swelling   hydrALAZINE 50 MG tablet Commonly known as: APRESOLINE Take 1 tablet (50 mg total) by mouth every 8 (eight) hours.   isosorbide mononitrate 60 MG 24 hr tablet Commonly known as: IMDUR Take 1 tablet (60 mg total) by mouth daily. Start taking on: February 07, 2021   metoprolol succinate 25 MG 24 hr tablet Commonly known as: TOPROL-XL Take 1 tablet (25 mg total) by mouth daily. Start taking on: February 07, 2021    MULTIVITAMIN ADULT PO Take 1 tablet by mouth daily.   nicotine 21 mg/24hr patch Commonly known as: NICODERM CQ - dosed in mg/24 hours Place onto the skin.   pantoprazole 40 MG tablet Commonly known as: PROTONIX Take 1 tablet by mouth daily.   Rivaroxaban 15 MG Tabs tablet Commonly known as: XARELTO Take 1 tablet (15 mg total) by mouth daily. Start taking on: February 07, 2021 What changed:  medication strength how much to take   rosuvastatin 40 MG tablet Commonly known as: CRESTOR Take 20 mg by mouth daily.   vitamin B-12 1000 MCG tablet Commonly known as: CYANOCOBALAMIN Take 1,000 mcg by mouth daily.        Follow-up Information     Rusty Aus, MD. Schedule an appointment as soon as possible for  a visit in 1 week(s).   Specialty: Internal Medicine Why: Hospital follow up Contact information: [place] [address] [phone]         [name], MD. Schedule an appointment as soon as possible for a visit in 2 week(s).   Specialty: Cardiology Why: Or Dr. Clayborn Bigness, first available. Contact information: [place] [address] [phone]         Lavonia Dana, MD Follow up in 1 week(s).   Specialty: Nephrology Why: Or Dr. Candiss Norse, first available Contact information: [place] [address] [phone]                No Known Allergies   If you experience worsening of your admission symptoms, develop shortness of breath, life threatening emergency, suicidal or homicidal thoughts you must seek medical attention immediately by calling 911 or calling your MD immediately  if symptoms less severe.    Please note   You were cared for by a hospitalist during your hospital stay. If you have any questions about your discharge medications or the care you received while you were in the hospital after you are discharged, you can  call the unit and asked to speak with the hospitalist on call if the hospitalist that took care of you is not available. Once you are discharged, your primary care physician will handle any further medical issues. Please note that NO REFILLS for any discharge medications will be authorized once you are discharged, as it is imperative that you return to your primary care physician (or establish a relationship with a primary care physician if you do not have one) for your aftercare needs so that they can reassess your need for medications and monitor your lab values.   Consultations: Cardiology Nephrology Gastroenterology    Procedures/Studies: CT ABDOMEN PELVIS WO CONTRAST  Result Date: 02/03/2021 CLINICAL DATA:  Hematuria, unknown cause. EXAM: CT ABDOMEN AND PELVIS WITHOUT CONTRAST TECHNIQUE: Multidetector CT imaging of the abdomen and pelvis was performed following the standard protocol without IV contrast. COMPARISON:  None. FINDINGS: Lower chest: Small right pleural effusion. Trace left pleural effusion. Associated passive atelectasis of bilateral lower lobes. Right middle lobe atelectasis. Coronary artery calcifications. Suggestion of anemia given cardiac appearance. Hepatobiliary: Subcentimeter hypodensity too small to characterize. Otherwise no focal liver abnormality. Contracted gallbladder with query intraluminal stones or sludge. No definite wall thickening or pericholecystic fluid. No biliary dilatation. Pancreas: No focal lesion. Normal pancreatic contour. No surrounding inflammatory changes. No main pancreatic ductal dilatation. Spleen: Normal in size without focal abnormality. Adrenals/Urinary Tract: No adrenal nodule bilaterally. Atrophic and scarred right kidney. Scarring of the left kidney. Calcifications associated with kidneys likely vascular. Nonspecific trace perinephric stranding bilaterally. No nephrolithiasis and no hydronephrosis. No definite contour-deforming renal mass. No  ureterolithiasis or hydroureter. The urinary bladder is unremarkable. Stomach/Bowel: PO contrast reaches the terminal ileum. Stomach is within normal limits. No evidence of bowel wall thickening or dilatation. Scattered colonic diverticulosis. Appendix appears normal. Vascular/Lymphatic: Aortobifemoral artery bypass graft with markedly limited evaluation on this noncontrast study. No abdominal aorta or iliac aneurysm. Moderate to severe atherosclerotic plaque of the aorta and its branches. No abdominal, pelvic, or inguinal lymphadenopathy. Reproductive: Prostate is unremarkable. Other: No intraperitoneal free fluid. No intraperitoneal free gas. No organized fluid collection. Musculoskeletal: There is a tiny fat containing umbilical hernia with an abdominal defect of 0.5 cm. No suspicious lytic or blastic osseous lesions. No acute  displaced fracture. Multilevel degenerative changes of the spine. IMPRESSION: 1. Small right and trace left pleural effusions. 2. Atrophic and scarred right kidney. Scarring of the left kidney. Markedly limited evaluation on this noncontrast study. 3. Scattered colonic diverticulosis with no acute diverticulitis. 4.  Contracted gallbladder with query intraluminal stones or sludge. 5. Aortic Atherosclerosis (ICD10-I70.0). Aortobifemoral artery bypass graft with markedly limited evaluation on this noncontrast study. Electronically Signed   By: Iven Finn M.D.   On: 02/03/2021 20:00   DG Chest 2 View  Result Date: 01/29/2021 CLINICAL DATA:  Shortness of breath, syncope EXAM: CHEST - 2 VIEW COMPARISON:  11/11/2015 FINDINGS: Trace bilateral pleural effusions. Bibasilar atelectasis, right greater than left. Heart is normal size. No acute bony abnormality. IMPRESSION: Bibasilar atelectasis and trace bilateral effusions. Electronically Signed   By: Rolm Baptise M.D.   On: 01/29/2021 17:27   US RENAL  Result Date: 02/01/2021 CLINICAL DATA:  Acute kidney failure.  Hematuria EXAM: RENAL  / URINARY TRACT ULTRASOUND COMPLETE COMPARISON:  CT abdomen pelvis 10/15/2019 FINDINGS: Right Kidney: Renal measurements: 7.0 x 4.1 x 3.4 cm = volume: 50 mL. Echogenicity within normal limits. No mass or hydronephrosis visualized. Left Kidney: Renal measurements: 10.8 x 5.1 x 4.7 cm = volume: 134 mL. Small arterial calcifications noted on CT. Echogenicity within normal limits. No mass or hydronephrosis visualized. Bladder: Appears normal for degree of bladder distention. Other: Moderately large pleural effusions bilaterally with bibasilar atelectasis IMPRESSION: Negative for renal obstruction.  Atrophic right kidney Moderately large bilateral pleural effusions. Electronically Signed   By: Franchot Gallo M.D.   On: 02/01/2021 10:00   US Venous Img Lower Bilateral  Result Date: 01/29/2021 CLINICAL DATA:  Bilateral leg swelling. Possible pulmonary embolus but unable to obtain chest CT angio due to renal function. Assess for DVT. EXAM: BILATERAL LOWER EXTREMITY VENOUS DOPPLER ULTRASOUND TECHNIQUE: Gray-scale sonography with compression, as well as color and duplex ultrasound, were performed to evaluate the deep venous system(s) from the level of the common femoral vein through the popliteal and proximal calf veins. COMPARISON:  None. FINDINGS: VENOUS Normal compressibility of the common femoral, superficial femoral, and popliteal veins, as well as the visualized calf veins. Visualized portions of profunda femoral vein and great saphenous vein unremarkable. No filling defects to suggest DVT on grayscale or color Doppler imaging. Doppler waveforms show normal direction of venous flow, normal respiratory plasticity and response to augmentation. OTHER None. Limitations: none IMPRESSION: No evidence of bilateral lower extremity DVT. Electronically Signed   By: Keith Rake M.D.   On: 01/29/2021 22:52   ECHOCARDIOGRAM COMPLETE  Result Date: 01/30/2021    ECHOCARDIOGRAM REPORT   Patient Name:   Abdulloh A Phil Dopp. Date of Exam: 01/30/2021 Medical Rec #:  [id number]             Height:       73.0 in Accession #:    [id number]            Weight:       197.0 lb Date of Birth:  1950/12/19             BSA:          2.138 m Patient Age:    7 years              BP:           157/87 mmHg Patient Gender: M  HR:           87 bpm. Exam Location:  [facility] Procedure: 2D Echo, Color Doppler and Cardiac Doppler Indications:     I50.21 congestive heart failure-Acute Systolic  History:         Patient has prior history of Echocardiogram examinations. PAD                  and Stroke, Signs/Symptoms:Shortness of Breath and Edema; Risk                  Factors:Dyslipidemia.  Sonographer:     Charmayne Sheer Referring Phys:  [id number] Gretta Cool PATEL Diagnosing Phys: Yolonda Kida MD  Sonographer Comments: Suboptimal parasternal window. IMPRESSIONS  1. Left ventricular ejection fraction, by estimation, is 25 to 30%. The left ventricle has severely decreased function. The left ventricle demonstrates global hypokinesis. The left ventricular internal cavity size was mildly dilated. Left ventricular diastolic parameters are consistent with Grade II diastolic dysfunction (pseudonormalization).  2. Right ventricular systolic function is normal. The right ventricular size is normal.  3. Left atrial size was mildly dilated.  4. The mitral valve is normal in structure. Trivial mitral valve regurgitation.  5. The aortic valve is grossly normal. Aortic valve regurgitation is not visualized. FINDINGS  Left Ventricle: Left ventricular ejection fraction, by estimation, is 25 to 30%. The left ventricle has severely decreased function. The left ventricle demonstrates global hypokinesis. The left ventricular internal cavity size was mildly dilated. There is no left ventricular hypertrophy. Left ventricular diastolic parameters are consistent with Grade II diastolic dysfunction (pseudonormalization). Right Ventricle: The right ventricular size is  normal. No increase in right ventricular wall thickness. Right ventricular systolic function is normal. Left Atrium: Left atrial size was mildly dilated. Right Atrium: Right atrial size was normal in size. Pericardium: There is no evidence of pericardial effusion. Mitral Valve: The mitral valve is normal in structure. Trivial mitral valve regurgitation. MV peak gradient, 3.0 mmHg. The mean mitral valve gradient is 2.0 mmHg. Tricuspid Valve: The tricuspid valve is normal in structure. Tricuspid valve regurgitation is trivial. Aortic Valve: The aortic valve is grossly normal. Aortic valve regurgitation is not visualized. Aortic valve mean gradient measures 3.0 mmHg. Aortic valve peak gradient measures 4.0 mmHg. Aortic valve area, by VTI measures 2.52 cm. Pulmonic Valve: The pulmonic valve was normal in structure. Pulmonic valve regurgitation is not visualized. Aorta: The ascending aorta was not well visualized. IAS/Shunts: No atrial level shunt detected by color flow Doppler. Additional Comments: There is no pleural effusion.  LEFT VENTRICLE PLAX 2D LVIDd:         5.00 cm      Diastology LVIDs:         4.30 cm      LV e' medial:    4.68 cm/s LV PW:         1.20 cm      LV E/e' medial:  19.9 LV IVS:        1.00 cm      LV e' lateral:   7.40 cm/s LVOT diam:     2.10 cm      LV E/e' lateral: 12.6 LV SV:         59 LV SV Index:   28 LVOT Area:     3.46 cm  LV Volumes (MOD) LV vol d, MOD A2C: 175.0 ml LV vol d, MOD A4C: 120.0 ml LV vol s, MOD A2C: 126.0 ml LV vol s, MOD A4C: 91.3  ml LV SV MOD A2C:     49.0 ml LV SV MOD A4C:     120.0 ml LV SV MOD BP:      35.8 ml RIGHT VENTRICLE RV Basal diam:  2.90 cm LEFT ATRIUM             Index        RIGHT ATRIUM           Index LA diam:        4.10 cm 1.92 cm/m   RA Area:     12.60 cm LA Vol (A2C):   52.5 ml 24.56 ml/m  RA Volume:   29.40 ml  13.75 ml/m LA Vol (A4C):   64.0 ml 29.94 ml/m LA Biplane Vol: 58.3 ml 27.27 ml/m  AORTIC VALVE                    PULMONIC VALVE AV  Area (Vmax):    3.14 cm     PV Vmax:       0.94 m/s AV Area (Vmean):   2.66 cm     PV Vmean:      69.500 cm/s AV Area (VTI):     2.52 cm     PV VTI:        0.183 m AV Vmax:           100.00 cm/s  PV Peak grad:  3.5 mmHg AV Vmean:          79.500 cm/s  PV Mean grad:  2.0 mmHg AV VTI:            0.235 m AV Peak Grad:      4.0 mmHg AV Mean Grad:      3.0 mmHg LVOT Vmax:         90.70 cm/s LVOT Vmean:        61.100 cm/s LVOT VTI:          0.171 m LVOT/AV VTI ratio: 0.73  AORTA Ao Root diam: 3.20 cm MITRAL VALVE MV Area (PHT): 6.83 cm    SHUNTS MV Area VTI:   2.93 cm    Systemic VTI:  0.17 m MV Peak grad:  3.0 mmHg    Systemic Diam: 2.10 cm MV Mean grad:  2.0 mmHg MV Vmax:       0.87 m/s MV Vmean:      72.3 cm/s MV Decel Time: 111 msec MV E velocity: 93.00 cm/s MV A velocity: 88.30 cm/s MV E/A ratio:  1.05 Dwayne D Callwood MD Electronically signed by Yolonda Kida MD Signature Date/Time: 01/30/2021/2:12:21 PM    Final         Subjective: Pt fees okay today. No specific complaints. Eager to go home.  Ambulating well.  No CP or SOB.   Discharge Exam: Vitals:   02/06/21 0758 02/06/21 1124  BP: (!) 155/65 (!) 147/77  Pulse: 77 66  Resp: 20 20  Temp: 98.7 F (37.1 C) 97.7 F (36.5 C)  SpO2: 97% 100%   Vitals:   02/06/21 0421 02/06/21 0500 02/06/21 0758 02/06/21 1124  BP: (!) 152/64  (!) 155/65 (!) 147/77  Pulse: 72  77 66  Resp: _0 Temp: 97.8 F (36.6 C)  98.7 F (37.1 C) 97.7 F (36.5 C)  TempSrc:      SpO2: 98%  97% 100%  Weight:  81.4 kg    Height:        General: Pt is alert, awake, not  in acute distress Cardiovascular: RRR, S1/S2 +, no rubs, no gallops Respiratory: CTA bilaterally, no wheezing, no rhonchi Abdominal: Soft, NT, ND, bowel sounds + Extremities: no edema, no cyanosis    The results of significant diagnostics from this hospitalization (including imaging, microbiology, ancillary and laboratory) are listed below for reference.      Microbiology: Recent Results (from the past 240 hour(s))  Resp Panel by RT-PCR (Flu A&B, Covid) Nasopharyngeal Swab     Status: None   Collection Time: 01/29/21  9:17 PM   Specimen: Nasopharyngeal Swab; Nasopharyngeal(NP) swabs in vial transport medium  Result Value Ref Range Status   SARS Coronavirus 2 by RT PCR NEGATIVE NEGATIVE Final    Comment: (NOTE) SARS-CoV-2 target nucleic acids are NOT DETECTED.  The SARS-CoV-2 RNA is generally detectable in upper respiratory specimens during the acute phase of infection. The lowest concentration of SARS-CoV-2 viral copies this assay can detect is 138 copies/mL. A negative result does not preclude SARS-Cov-2 infection and should not be used as the sole basis for treatment or other patient management decisions. A negative result may occur with  improper specimen collection/handling, submission of specimen other than nasopharyngeal swab, presence of viral mutation(s) within the areas targeted by this assay, and inadequate number of viral copies(<138 copies/mL). A negative result must be combined with clinical observations, patient history, and epidemiological information. The expected result is Negative.  Fact Sheet for Patients:  EntrepreneurPulse.com.au  Fact Sheet for Healthcare Providers:  IncredibleEmployment.be  This test is no t yet approved or cleared by the [place] FDA and  has been authorized for detection and/or diagnosis of SARS-CoV-2 by FDA under an Emergency Use Authorization (EUA). This EUA will remain  in effect (meaning this test can be used) for the duration of the COVID-19 declaration under Section 564(b)(1) of the Act, 21 U.S.C.section 360bbb-3(b)(1), unless the authorization is terminated  or revoked sooner.       Influenza A by PCR NEGATIVE NEGATIVE Final   Influenza B by PCR NEGATIVE NEGATIVE Final    Comment: (NOTE) The Xpert Xpress SARS-CoV-2/FLU/RSV plus assay is  intended as an aid in the diagnosis of influenza from Nasopharyngeal swab specimens and should not be used as a sole basis for treatment. Nasal washings and aspirates are unacceptable for Xpert Xpress SARS-CoV-2/FLU/RSV testing.  Fact Sheet for Patients: EntrepreneurPulse.com.au  Fact Sheet for Healthcare Providers: IncredibleEmployment.be  This test is not yet approved or cleared by the [place] FDA and has been authorized for detection and/or diagnosis of SARS-CoV-2 by FDA under an Emergency Use Authorization (EUA). This EUA will remain in effect (meaning this test can be used) for the duration of the COVID-19 declaration under Section 564(b)(1) of the Act, 21 U.S.C. section 360bbb-3(b)(1), unless the authorization is terminated or revoked.  Performed at [facility], [address] [place],  [place] [address]   KOH prep     Status: None   Collection Time: 02/02/21  3:18 PM   Specimen: Esophagus  Result Value Ref Range Status   Specimen Description ESOPHAGUS  Final   Special Requests Normal  Final   KOH Prep   Final    YEAST WITH PSEUDOHYPHAE Performed at [facility], [address], [place], [place] [address]    Report Status 02/02/2021 FINAL  Final     Labs: BNP (last 3 results) Recent Labs    01/29/21 1700  BNP 3,403.7*   Basic Metabolic Panel: Recent Labs  Lab 01/31/21 0434 02/01/21 0442 02/02/21 0548 02/03/21  8676 02/04/21 0616 02/05/21 0610 02/06/21 0454  NA 139   < > 139 137 136 135 135  K 4.6   < > 4.1 3.9 4.0 4.0 4.1  CL 107   < > 106 103 102 100 104  CO2 22   < > _0 GLUCOSE 128*   < > 112* 103* 103* 112* 83  BUN 47*   < > 63* 58* 69* 79* 80*  CREATININE 2.46*   < > 2.36* 2.38* 2.90* 3.22* 3.09*  CALCIUM 8.9   < > 8.7* 8.8* 8.7* 8.6* 8.2*  MG 2.1  --   --   --   --   --   --   PHOS 4.4  --   --   --   --   --   --    < > = values in this interval not displayed.   Liver  Function Tests: Recent Labs  Lab 01/31/21 0434  AST 39  ALT 24  ALKPHOS 69  BILITOT 1.0  PROT 7.2  ALBUMIN 4.0   No results for input(s): LIPASE, AMYLASE in the last 168 hours. No results for input(s): AMMONIA in the last 168 hours. CBC: Recent Labs  Lab 01/31/21 0434 02/01/21 0442 02/02/21 0548 02/03/21 0336 02/05/21 0610  WBC 15.1* 16.6* 13.9*  --   --   HGB 10.2* 9.7* 10.2* 10.2* 10.5*  HCT 30.7* 29.5* 30.5* 30.7* 30.7*  MCV 89.0 88.1 88.4  --   --   PLT 222 220 209  --   --    Cardiac Enzymes: No results for input(s): CKTOTAL, CKMB, CKMBINDEX, TROPONINI in the last 168 hours. BNP: Invalid input(s): POCBNP CBG: No results for input(s): GLUCAP in the last 168 hours. D-Dimer No results for input(s): DDIMER in the last 72 hours. Hgb A1c No results for input(s): HGBA1C in the last 72 hours. Lipid Profile No results for input(s): CHOL, HDL, LDLCALC, TRIG, CHOLHDL, LDLDIRECT in the last 72 hours. Thyroid function studies No results for input(s): TSH, T4TOTAL, T3FREE, THYROIDAB in the last 72 hours.  Invalid input(s): FREET3 Anemia work up No results for input(s): VITAMINB12, FOLATE, FERRITIN, TIBC, IRON, RETICCTPCT in the last 72 hours. Urinalysis    Component Value Date/Time   COLORURINE YELLOW (A) 02/05/2021 0946   APPEARANCEUR CLEAR (A) 02/05/2021 0946   LABSPEC 1.018 02/05/2021 0946   PHURINE 5.0 02/05/2021 0946   GLUCOSEU NEGATIVE 02/05/2021 0946   HGBUR NEGATIVE 02/05/2021 0946   BILIRUBINUR NEGATIVE 02/05/2021 0946   KETONESUR NEGATIVE 02/05/2021 0946   PROTEINUR NEGATIVE 02/05/2021 0946   NITRITE NEGATIVE 02/05/2021 0946   LEUKOCYTESUR NEGATIVE 02/05/2021 0946   Sepsis Labs Invalid input(s): PROCALCITONIN,  WBC,  LACTICIDVEN Microbiology Recent Results (from the past 240 hour(s))  Resp Panel by RT-PCR (Flu A&B, Covid) Nasopharyngeal Swab     Status: None   Collection Time: 01/29/21  9:17 PM   Specimen: Nasopharyngeal Swab; Nasopharyngeal(NP)  swabs in vial transport medium  Result Value Ref Range Status   SARS Coronavirus 2 by RT PCR NEGATIVE NEGATIVE Final    Comment: (NOTE) SARS-CoV-2 target nucleic acids are NOT DETECTED.  The SARS-CoV-2 RNA is generally detectable in upper respiratory specimens during the acute phase of infection. The lowest concentration of SARS-CoV-2 viral copies this assay can detect is 138 copies/mL. A negative result does not preclude SARS-Cov-2 infection and should not be used as the sole basis for treatment or other patient management decisions. A negative result may occur  with  improper specimen collection/handling, submission of specimen other than nasopharyngeal swab, presence of viral mutation(s) within the areas targeted by this assay, and inadequate number of viral copies(<138 copies/mL). A negative result must be combined with clinical observations, patient history, and epidemiological information. The expected result is Negative.  Fact Sheet for Patients:  EntrepreneurPulse.com.au  Fact Sheet for Healthcare Providers:  IncredibleEmployment.be  This test is no t yet approved or cleared by the [place] FDA and  has been authorized for detection and/or diagnosis of SARS-CoV-2 by FDA under an Emergency Use Authorization (EUA). This EUA will remain  in effect (meaning this test can be used) for the duration of the COVID-19 declaration under Section 564(b)(1) of the Act, 21 U.S.C.section 360bbb-3(b)(1), unless the authorization is terminated  or revoked sooner.       Influenza A by PCR NEGATIVE NEGATIVE Final   Influenza B by PCR NEGATIVE NEGATIVE Final    Comment: (NOTE) The Xpert Xpress SARS-CoV-2/FLU/RSV plus assay is intended as an aid in the diagnosis of influenza from Nasopharyngeal swab specimens and should not be used as a sole basis for treatment. Nasal washings and aspirates are unacceptable for Xpert Xpress  SARS-CoV-2/FLU/RSV testing.  Fact Sheet for Patients: EntrepreneurPulse.com.au  Fact Sheet for Healthcare Providers: IncredibleEmployment.be  This test is not yet approved or cleared by the [place] FDA and has been authorized for detection and/or diagnosis of SARS-CoV-2 by FDA under an Emergency Use Authorization (EUA). This EUA will remain in effect (meaning this test can be used) for the duration of the COVID-19 declaration under Section 564(b)(1) of the Act, 21 U.S.C. section 360bbb-3(b)(1), unless the authorization is terminated or revoked.  Performed at [facility], [address] [place], [place] [address]   KOH prep     Status: None   Collection Time: 02/02/21  3:18 PM   Specimen: Esophagus  Result Value Ref Range Status   Specimen Description ESOPHAGUS  Final   Special Requests Normal  Final   KOH Prep   Final    YEAST WITH PSEUDOHYPHAE Performed at [facility], [address], [place], [place] [address]    Report Status 02/02/2021 FINAL  Final     Time coordinating discharge: Over 30 minutes  SIGNED:   Ezekiel Slocumb, DO [facility] 02/06/2021, 12:12 PM   If 7PM-7AM, please contact night-coverage [email or web address]

## 2021-02-06 NOTE — Progress Notes (Signed)
[facility]  ROUNDING NOTE   Subjective:    Patient seen sitting up in bed, alert and oriented Denies nausea, vomiting, shortness of breath. States he feels well and is ready for discharge  Objective:  Vital signs in last 24 hours:  Temp:  [97.7 F (36.5 C)-98.7 F (37.1 C)] 97.7 F (36.5 C) (11/22 1124) Pulse Rate:  [63-77] 66 (11/22 1124) Resp:  [16-20] 20 (11/22 1124) BP: (142-155)/(55-77) 147/77 (11/22 1124) SpO2:  [97 %-100 %] 100 % (11/22 1124) Weight:  [81.4 kg] 81.4 kg (11/22 0500)  Weight change:  Filed Weights   02/01/21 0257 02/02/21 0538 02/06/21 0500  Weight: 87 kg 85.2 kg 81.4 kg    Intake/Output: I/O last 3 completed shifts: In: 5188 [P.O.:1560; I.V.:3] Out: 1945 [CZYSA:6301]   Intake/Output this shift:  Total I/O In: 480 [P.O.:480] Out: 120 [Urine:120]  Physical Exam: Physical Exam: General:  No acute distress, laying in the bed  HEENT  anicteric, moist oral mucous membrane  Pulm/lungs  normal breathing effort, diminished bases  CVS/Heart  regular rhythm, no rub or gallop  Abdomen:   Soft, nontender  Extremities:  No peripheral edema  Neurologic:  Alert, oriented, able to follow commands  Skin:  No acute rashes     Basic Metabolic Panel: Recent Labs  Lab 01/31/21 0434 02/01/21 0442 02/02/21 0548 02/03/21 0336 02/04/21 0616 02/05/21 0610 02/06/21 0454  NA 139   < > 139 137 136 135 135  K 4.6   < > 4.1 3.9 4.0 4.0 4.1  CL 107   < > 106 103 102 100 104  CO2 22   < > _0 GLUCOSE 128*   < > 112* 103* 103* 112* 83  BUN 47*   < > 63* 58* 69* 79* 80*  CREATININE 2.46*   < > 2.36* 2.38* 2.90* 3.22* 3.09*  CALCIUM 8.9   < > 8.7* 8.8* 8.7* 8.6* 8.2*  MG 2.1  --   --   --   --   --   --   PHOS 4.4  --   --   --   --   --   --    < > = values in this interval not displayed.     Liver Function Tests: Recent Labs  Lab 01/31/21 0434  AST 39  ALT 24  ALKPHOS 69  BILITOT 1.0  PROT 7.2  ALBUMIN 4.0    No  results for input(s): LIPASE, AMYLASE in the last 168 hours. No results for input(s): AMMONIA in the last 168 hours.  CBC: Recent Labs  Lab 01/31/21 0434 02/01/21 0442 02/02/21 0548 02/03/21 0336 02/05/21 0610  WBC 15.1* 16.6* 13.9*  --   --   HGB 10.2* 9.7* 10.2* 10.2* 10.5*  HCT 30.7* 29.5* 30.5* 30.7* 30.7*  MCV 89.0 88.1 88.4  --   --   PLT 222 220 209  --   --      Cardiac Enzymes: No results for input(s): CKTOTAL, CKMB, CKMBINDEX, TROPONINI in the last 168 hours.  BNP: Invalid input(s): POCBNP  CBG: No results for input(s): GLUCAP in the last 168 hours.  Microbiology: Results for orders placed or performed during the hospital encounter of 01/29/21  Resp Panel by RT-PCR (Flu A&B, Covid) Nasopharyngeal Swab     Status: None   Collection Time: 01/29/21  9:17 PM   Specimen: Nasopharyngeal Swab; Nasopharyngeal(NP) swabs in vial transport medium  Result Value Ref Range Status  SARS Coronavirus 2 by RT PCR NEGATIVE NEGATIVE Final    Comment: (NOTE) SARS-CoV-2 target nucleic acids are NOT DETECTED.  The SARS-CoV-2 RNA is generally detectable in upper respiratory specimens during the acute phase of infection. The lowest concentration of SARS-CoV-2 viral copies this assay can detect is 138 copies/mL. A negative result does not preclude SARS-Cov-2 infection and should not be used as the sole basis for treatment or other patient management decisions. A negative result may occur with  improper specimen collection/handling, submission of specimen other than nasopharyngeal swab, presence of viral mutation(s) within the areas targeted by this assay, and inadequate number of viral copies(<138 copies/mL). A negative result must be combined with clinical observations, patient history, and epidemiological information. The expected result is Negative.  Fact Sheet for Patients:  EntrepreneurPulse.com.au  Fact Sheet for Healthcare Providers:   IncredibleEmployment.be  This test is no t yet approved or cleared by the [place] FDA and  has been authorized for detection and/or diagnosis of SARS-CoV-2 by FDA under an Emergency Use Authorization (EUA). This EUA will remain  in effect (meaning this test can be used) for the duration of the COVID-19 declaration under Section 564(b)(1) of the Act, 21 U.S.C.section 360bbb-3(b)(1), unless the authorization is terminated  or revoked sooner.       Influenza A by PCR NEGATIVE NEGATIVE Final   Influenza B by PCR NEGATIVE NEGATIVE Final    Comment: (NOTE) The Xpert Xpress SARS-CoV-2/FLU/RSV plus assay is intended as an aid in the diagnosis of influenza from Nasopharyngeal swab specimens and should not be used as a sole basis for treatment. Nasal washings and aspirates are unacceptable for Xpert Xpress SARS-CoV-2/FLU/RSV testing.  Fact Sheet for Patients: EntrepreneurPulse.com.au  Fact Sheet for Healthcare Providers: IncredibleEmployment.be  This test is not yet approved or cleared by the [place] FDA and has been authorized for detection and/or diagnosis of SARS-CoV-2 by FDA under an Emergency Use Authorization (EUA). This EUA will remain in effect (meaning this test can be used) for the duration of the COVID-19 declaration under Section 564(b)(1) of the Act, 21 U.S.C. section 360bbb-3(b)(1), unless the authorization is terminated or revoked.  Performed at [facility], [address] [place], [place] [address]   KOH prep     Status: None   Collection Time: 02/02/21  3:18 PM   Specimen: Esophagus  Result Value Ref Range Status   Specimen Description ESOPHAGUS  Final   Special Requests Normal  Final   KOH Prep   Final    YEAST WITH PSEUDOHYPHAE Performed at [facility], [address] [place], [place] [address]    Report Status 02/02/2021 FINAL  Final    Coagulation Studies: No  results for input(s): LABPROT, INR in the last 72 hours.   Urinalysis: Recent Labs    02/05/21 0946  COLORURINE YELLOW*  LABSPEC 1.018  PHURINE 5.0  GLUCOSEU NEGATIVE  HGBUR NEGATIVE  BILIRUBINUR NEGATIVE  KETONESUR NEGATIVE  PROTEINUR NEGATIVE  NITRITE NEGATIVE  LEUKOCYTESUR NEGATIVE       Imaging: No results found.   Medications:      amLODipine  10 mg Oral Daily   aspirin EC  81 mg Oral Daily   dextromethorphan-guaiFENesin  1 tablet Oral BID   hydrALAZINE  50 mg Oral Q8H   isosorbide mononitrate  60 mg Oral Daily   metoprolol succinate  25 mg Oral Daily   nicotine  14 mg Transdermal Daily   pantoprazole  40 mg Oral Daily   rivaroxaban  15 mg Oral Daily   rosuvastatin  20 mg Oral Daily   sodium chloride flush  3 mL Intravenous Q12H   acetaminophen **OR** acetaminophen, albuterol, hydrALAZINE, morphine injection, ondansetron **OR** ondansetron (ZOFRAN) IV  Assessment/ Plan:  [name] is a 70 y.o. white male with COPD, hypertension, peripheral vascular disease with a history of femoral endarterectomy in 2017, CVA, CO exposure, peptic ulcer disease, who was admitted to [facility] on 01/29/2021 for Respiratory distress [R06.03] Elevated troponin [R77.8] Acute respiratory failure with hypoxia (HCC) [I26.41] Acute systolic (congestive) heart failure (HCC) [I50.21] Acute on chronic congestive heart failure, unspecified heart failure type (HCC) [I50.9]   Acute kidney injury on chronic kidney disease stage IIIB: baseline creatinine of 2.17, GFR of 32 on 02/01/21. Chronic kidney disease risk factors include NSAID (years of using meloxicam and celecoxib), severe vascular disease, HTN, ongoing smoking.   -Kidneys are noted to be atrophic on CT scan.  No IV contrast exposure. Renal ultrasound without obstruction but with atrophic right kidney - Pending serologic work up. ANA negative, anti-GBM negative, complements negative, UPEP normal. Hepatitis viral screen  negative -Creatinine improved to 3.1 today.  Cleared for discharge from renal stance.  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 EF 25-20% with grade II diastolic dysfunction. ([place] nov 2022) - Holding diuretics.  - Continue reasonable fluid restriction   Hypertension:   Prior to admission, home regimen included amlodipine 63m, olmesartan and hydrochlorothiazide -Currently managed with hydralazine, isosorbide and metoprolol  - discussed low salt diet   Hematuria: patient has had hematuria on urinalysis in 2019. Ultrasound and CT with no etiology found.  - repeat U/A unremarkable - ANA negative, anti-GBM negative, complements negative, UPEP normal.  - ANCA negative   LOS: 8   11/22/20223:42 PM

## 2021-02-06 NOTE — Plan of Care (Signed)

## 2021-02-06 NOTE — Progress Notes (Signed)
[facility]    SUBJECTIVE:   - Renal function improving.  - Feels good this morning.  - Able to lay flat without breathing difficulty. No chest pain or shortness of breath.    Vitals:   02/05/21 1951 02/06/21 0104 02/06/21 0421 02/06/21 0500  BP: (!) 142/55 (!) 154/75 (!) 152/64   Pulse: 66 67 72   Resp: _0 Temp: 98 F (36.7 C) 98 F (36.7 C) 97.8 F (36.6 C)   TempSrc: Oral     SpO2: 97% 99% 98%   Weight:    81.4 kg  Height:         Intake/Output Summary (Last 24 hours) at 02/06/2021 7829 Last data filed at 02/05/2021 2200 Gross per 24 hour  Intake 1560 ml  Output 620 ml  Net 940 ml       PHYSICAL EXAM  General: Well developed, well nourished, in no acute distress HEENT:  Normocephalic and atramatic Neck:  No JVD.  Lungs: Diminished breath sounds clear bilaterally to auscultation and percussion. Heart: HRRR . Normal S1 and S2 without gallops or murmurs.  Abdomen: Bowel sounds are positive, abdomen soft and non-tender  Msk:  Back normal, normal gait. Normal strength and tone for age. Extremities: No clubbing, cyanosis or 2+edema.   Neuro: Alert and oriented X 3. Psych:  Good affect, responds appropriately   LABS: Basic Metabolic Panel: Recent Labs    02/05/21 0610 02/06/21 0454  NA 135 135  K 4.0 4.1  CL 100 104  CO2 23 23  GLUCOSE 112* 83  BUN 79* 80*  CREATININE 3.22* 3.09*  CALCIUM 8.6* 8.2*    Liver Function Tests: No results for input(s): AST, ALT, ALKPHOS, BILITOT, PROT, ALBUMIN in the last 72 hours.  No results for input(s): LIPASE, AMYLASE in the last 72 hours. CBC: Recent Labs    02/05/21 0610  HGB 10.5*  HCT 30.7*    Cardiac Enzymes: No results for input(s): CKTOTAL, CKMB, CKMBINDEX, TROPONINI in the last 72 hours. BNP: Invalid input(s): POCBNP D-Dimer: No results for input(s): DDIMER in the last 72 hours. Hemoglobin A1C: No results for input(s): HGBA1C in the last 72 hours.  Fasting Lipid Panel: No results  for input(s): CHOL, HDL, LDLCALC, TRIG, CHOLHDL, LDLDIRECT in the last 72 hours.  Thyroid Function Tests: No results for input(s): TSH, T4TOTAL, T3FREE, THYROIDAB in the last 72 hours.  Invalid input(s): FREET3  Anemia Panel: No results for input(s): VITAMINB12, FOLATE, FERRITIN, TIBC, IRON, RETICCTPCT in the last 72 hours.   No results found.   [place] moderate to severely depressed left ventricular function at 25 to 30%  TELEMETRY: Sinus rhythm 85 nonspecific findings:  ASSESSMENT AND PLAN:  Principal Problem:   Shortness of breath Active Problems:   Benign essential HTN   Duodenal ulcer disease   Tobacco abuse counseling   Peripheral vascular disease (HCC)   Hyperlipidemia   CVA (cerebral vascular accident) ([place])   Acute respiratory failure with hypoxia (HCC)   Melena   Anemia   Acute systolic (congestive) heart failure ([place]) Demand ischemia this does not represent a non-STEMI  Plan Gary Mccoy is a 70 year old male with history of CVA, PAD (on Xarelto following vascular surgery), HFrEF (previously 45-50, now 25 to 30%), hypertension, hyperlipidemia who was admitted with shortness of breath.  Cardiology is consulted for elevated troponin in presence of pulmonary edema.  Additionally we are consulted for evaluation of newly reduced ejection fraction.  #Acute systolic heart failure #Acute renal insufficiency #Demand  ischemia EF previously 45 to 50%, now decreased to 25 to 30%.  He was grossly volume overloaded on admission with pulmonary edema which has resolved with diuresis.  Troponin peak was 185.  Patient has renal insufficiency with a baseline creatinine of approximately 1.7-2, and is currently elevated to > 3. - ARB initiated saturday with large bump in Cr. Discontinued. -Hold diuresis with torsemide 20 mg again today.Will need to decided on outpatient regimen; possibly 20 mg PO lasix to start in 2 days. -Continue metoprolol XL 25 mg daily -Continue aspirin 81 mg  daily -Continue Crestor 20 mg daily -We will need addition of afterload reduction.  Given renal insufficiency we will hold on initiating ACE or ARB.  Hydral 50 mg TID PO. Imdur 60 mg daily -We will need ischemic evaluation.  Given his ongoing bleeding issues and renal insufficiency this can likely happen as an outpatient. - Will schedule f/u with Dr. Clayborn Bigness or me in 1-2 weeks   [name], MD 02/06/2021 7:52 AM

## 2021-02-11 NOTE — Consult Note (Signed)
CARDIOLOGY CONSULT NOTE               Patient ID: Gary Mccoy. MRN: [id number] DOB/AGE: [birth year] 69 y.o.  Admit date: 01/29/2021 Referring Physician Florina Ou hospitalist Primary Physician Emily Filbert primary Primary Cardiologist  Reason for Consultation shortness of breath heart failure  HPI: Patient is a 70 year old male with progressive shortness of breath cough congestion heart failure renal insufficiency known peripheral vascular disease on anticoagulation with Xarelto found to have heme positive stools with melena smoking hypertension denies any significant discrete chest pain but has significant dyspnea shortness of breath.  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    Complication of anesthesia    Violent with hallucinations after procedure 10/2015   DDD (degenerative disc disease), lumbar    Duodenal ulcer    Elevated lipids    GERD (gastroesophageal reflux disease)    Leg weakness, bilateral    s/p lumbar surgery   PAD (peripheral artery disease) ([place])    Polyradiculitis    Stroke ([place])    Rt lower leg  estimated 10 - 12 stokes in last 5 years   Wears dentures    full upper and lower    Past Surgical History:  Procedure Laterality Date   AORTA - BILATERAL FEMORAL ARTERY BYPASS GRAFT N/A 11/08/2015   Procedure: AORTA BIFEMORAL BYPASS GRAFT;  Surgeon: Katha Cabal, MD;  Location: [facility];  Service: Vascular;  Laterality: N/A;   BACK SURGERY  2005   CERVICAL [place]   COLONOSCOPY WITH PROPOFOL N/A 12/30/2017   Procedure:  COLONOSCOPY WITH PROPOFOL;  Surgeon: Toledo, Benay Pike, MD;  Location: [facility];  Service: Gastroenterology;  Laterality: N/A;   COLONOSCOPY WITH PROPOFOL N/A 02/02/2021   Procedure: COLONOSCOPY WITH PROPOFOL;  Surgeon: Lesly Rubenstein, MD;  Location: [facility];  Service: Endoscopy;  Laterality: N/A;  Melena   ENDARTERECTOMY FEMORAL Bilateral 11/08/2015   Procedure: ENDARTERECTOMY FEMORAL;  Surgeon: Katha Cabal, MD;  Location: [facility];  Service: Vascular;  Laterality: Bilateral;  common, SFA, and profundis  Aortic endarterectomy    ESOPHAGOGASTRODUODENOSCOPY (EGD) WITH PROPOFOL N/A 09/04/2015   Procedure: ESOPHAGOGASTRODUODENOSCOPY (EGD) WITH PROPOFOL;  Surgeon: Lucilla Lame, MD;  Location: [place];  Service: Endoscopy;  Laterality: N/A;   ESOPHAGOGASTRODUODENOSCOPY (EGD) WITH PROPOFOL N/A 02/02/2021   Procedure: ESOPHAGOGASTRODUODENOSCOPY (EGD) WITH PROPOFOL;  Surgeon: Lesly Rubenstein, MD;  Location: [facility];  Service: Endoscopy;  Laterality: N/A;  Melena   LUMBAR LAMINECTOMY  2006   SEPTOPLASTY N/A 09/28/2020   Procedure: SEPTOPLASTY;  Surgeon: Margaretha Sheffield, MD;  Location: [place];  Service: ENT;  Laterality: N/A;   TEE WITHOUT CARDIOVERSION N/A 09/22/2015   Procedure: TRANSESOPHAGEAL ECHOCARDIOGRAM (TEE);  Surgeon: Teodoro Spray, MD;  Location: [facility];  Service: Cardiovascular;  Laterality: N/A;   TURBINATE REDUCTION Bilateral 09/28/2020   Procedure: TURBINATE REDUCTION;  Surgeon: Margaretha Sheffield, MD;  Location: [place];  Service: ENT;  Laterality: Bilateral;    No medications prior to admission.   Social History   Socioeconomic History  Marital status: Married    Spouse name: Not on file   Number of children: Not on file   Years of education: Not on file   Highest education level: Not on file  Occupational History   Not on file  Tobacco Use   Smoking status: Every Day    Packs/day: 1.00    Years: 45.00    Pack  years: 45.00    Types: Cigarettes   Smokeless tobacco: Never   Tobacco comments:    09/21/20- currently 0.5 PPD  Vaping Use   Vaping Use: Never used  Substance and Sexual Activity   Alcohol use: Yes    Comment: rare 3 a year   Drug use: No   Sexual activity: Not on file  Other Topics Concern   Not on file  Social History Narrative   Not on file   Social Determinants of Health   Financial Resource Strain: Not on file  Food Insecurity: Not on file  Transportation Needs: Not on file  Physical Activity: Not on file  Stress: Not on file  Social Connections: Not on file  Intimate Partner Violence: Not on file    Family History  Problem Relation Age of Onset   Heart attack Mother    Hypertension Mother    Varicose Veins Mother    Diabetes Father    Heart attack Father    Heart attack Maternal Grandmother    Stroke Maternal Grandmother       Review of systems complete and found to be negative unless listed above      PHYSICAL EXAM  General: Well developed, well nourished, in no acute distress HEENT:  Normocephalic and atramatic Neck:  No JVD.  Lungs: Clear bilaterally to auscultation and percussion. Heart: HRRR . Normal S1 and S2 without gallops or murmurs.  Abdomen: Bowel sounds are positive, abdomen soft and non-tender  Msk:  Back normal, normal gait. Normal strength and tone for age. Extremities: No clubbing, cyanosis or edema.   Neuro: Alert and oriented X 3. Psych:  Good affect, responds appropriately  Labs:   Lab Results  Component Value Date   WBC 13.9 (H) 02/02/2021   HGB 10.5 (L) 02/05/2021   HCT 30.7 (L) 02/05/2021   MCV 88.4 02/02/2021   PLT 209 02/02/2021    Recent Labs  Lab 02/06/21 0454  NA 135  K 4.1  CL 104  CO2 23  BUN 80*  CREATININE 3.09*  CALCIUM 8.2*  GLUCOSE 83   Lab Results  Component Value Date   CKTOTAL 313 09/19/2015   TROPONINI <0.03 02/26/2018    Lab Results  Component Value Date   CHOL 129 01/31/2021   CHOL 160  02/26/2018   CHOL 175 09/20/2015   Lab Results  Component Value Date   HDL 38 (L) 01/31/2021   HDL 35 (L) 02/26/2018   HDL 39 (L) 09/20/2015   Lab Results  Component Value Date   LDLCALC 73 01/31/2021   LDLCALC 102 (H) 02/26/2018   LDLCALC 116 (H) 09/20/2015   Lab Results  Component Value Date   TRIG 91 01/31/2021   TRIG 117 02/26/2018   TRIG 98 09/20/2015   Lab Results  Component Value Date   CHOLHDL 3.4 01/31/2021   CHOLHDL 4.6 02/26/2018   CHOLHDL 4.5 09/20/2015   No results found for: LDLDIRECT    Radiology: CT ABDOMEN PELVIS WO CONTRAST  Result Date: 02/03/2021 CLINICAL DATA:  Hematuria, unknown cause. EXAM: CT ABDOMEN AND PELVIS WITHOUT CONTRAST TECHNIQUE: Multidetector  CT imaging of the abdomen and pelvis was performed following the standard protocol without IV contrast. COMPARISON:  None. FINDINGS: Lower chest: Small right pleural effusion. Trace left pleural effusion. Associated passive atelectasis of bilateral lower lobes. Right middle lobe atelectasis. Coronary artery calcifications. Suggestion of anemia given cardiac appearance. Hepatobiliary: Subcentimeter hypodensity too small to characterize. Otherwise no focal liver abnormality. Contracted gallbladder with query intraluminal stones or sludge. No definite wall thickening or pericholecystic fluid. No biliary dilatation. Pancreas: No focal lesion. Normal pancreatic contour. No surrounding inflammatory changes. No main pancreatic ductal dilatation. Spleen: Normal in size without focal abnormality. Adrenals/Urinary Tract: No adrenal nodule bilaterally. Atrophic and scarred right kidney. Scarring of the left kidney. Calcifications associated with kidneys likely vascular. Nonspecific trace perinephric stranding bilaterally. No nephrolithiasis and no hydronephrosis. No definite contour-deforming renal mass. No ureterolithiasis or hydroureter. The urinary bladder is unremarkable. Stomach/Bowel: PO contrast reaches the terminal  ileum. Stomach is within normal limits. No evidence of bowel wall thickening or dilatation. Scattered colonic diverticulosis. Appendix appears normal. Vascular/Lymphatic: Aortobifemoral artery bypass graft with markedly limited evaluation on this noncontrast study. No abdominal aorta or iliac aneurysm. Moderate to severe atherosclerotic plaque of the aorta and its branches. No abdominal, pelvic, or inguinal lymphadenopathy. Reproductive: Prostate is unremarkable. Other: No intraperitoneal free fluid. No intraperitoneal free gas. No organized fluid collection. Musculoskeletal: There is a tiny fat containing umbilical hernia with an abdominal defect of 0.5 cm. No suspicious lytic or blastic osseous lesions. No acute displaced fracture. Multilevel degenerative changes of the spine. IMPRESSION: 1. Small right and trace left pleural effusions. 2. Atrophic and scarred right kidney. Scarring of the left kidney. Markedly limited evaluation on this noncontrast study. 3. Scattered colonic diverticulosis with no acute diverticulitis. 4.  Contracted gallbladder with query intraluminal stones or sludge. 5. Aortic Atherosclerosis (ICD10-I70.0). Aortobifemoral artery bypass graft with markedly limited evaluation on this noncontrast study. Electronically Signed   By: Iven Finn M.D.   On: 02/03/2021 20:00   DG Chest 2 View  Result Date: 01/29/2021 CLINICAL DATA:  Shortness of breath, syncope EXAM: CHEST - 2 VIEW COMPARISON:  11/11/2015 FINDINGS: Trace bilateral pleural effusions. Bibasilar atelectasis, right greater than left. Heart is normal size. No acute bony abnormality. IMPRESSION: Bibasilar atelectasis and trace bilateral effusions. Electronically Signed   By: Rolm Baptise M.D.   On: 01/29/2021 17:27   US RENAL  Result Date: 02/01/2021 CLINICAL DATA:  Acute kidney failure.  Hematuria EXAM: RENAL / URINARY TRACT ULTRASOUND COMPLETE COMPARISON:  CT abdomen pelvis 10/15/2019 FINDINGS: Right Kidney: Renal  measurements: 7.0 x 4.1 x 3.4 cm = volume: 50 mL. Echogenicity within normal limits. No mass or hydronephrosis visualized. Left Kidney: Renal measurements: 10.8 x 5.1 x 4.7 cm = volume: 134 mL. Small arterial calcifications noted on CT. Echogenicity within normal limits. No mass or hydronephrosis visualized. Bladder: Appears normal for degree of bladder distention. Other: Moderately large pleural effusions bilaterally with bibasilar atelectasis IMPRESSION: Negative for renal obstruction.  Atrophic right kidney Moderately large bilateral pleural effusions. Electronically Signed   By: Franchot Gallo M.D.   On: 02/01/2021 10:00   US Venous Img Lower Bilateral  Result Date: 01/29/2021 CLINICAL DATA:  Bilateral leg swelling. Possible pulmonary embolus but unable to obtain chest CT angio due to renal function. Assess for DVT. EXAM: BILATERAL LOWER EXTREMITY VENOUS DOPPLER ULTRASOUND TECHNIQUE: Gray-scale sonography with compression, as well as color and duplex ultrasound, were performed to evaluate the deep venous system(s) from the level of the common femoral vein through  the popliteal and proximal calf veins. COMPARISON:  None. FINDINGS: VENOUS Normal compressibility of the common femoral, superficial femoral, and popliteal veins, as well as the visualized calf veins. Visualized portions of profunda femoral vein and great saphenous vein unremarkable. No filling defects to suggest DVT on grayscale or color Doppler imaging. Doppler waveforms show normal direction of venous flow, normal respiratory plasticity and response to augmentation. OTHER None. Limitations: none IMPRESSION: No evidence of bilateral lower extremity DVT. Electronically Signed   By: Keith Rake M.D.   On: 01/29/2021 22:52   ECHOCARDIOGRAM COMPLETE  Result Date: 01/30/2021    ECHOCARDIOGRAM REPORT   Patient Name:   Gary Mccoy. Date of Exam: 01/30/2021 Medical Rec #:  [id number]             Height:       73.0 in Accession #:     [id number]            Weight:       197.0 lb Date of Birth:  [birth year]             BSA:          2.138 m Patient Age:    70 years              BP:           157/87 mmHg Patient Gender: M                     HR:           87 bpm. Exam Location:  [facility] Procedure: 2D Echo, Color Doppler and Cardiac Doppler Indications:     I50.21 congestive heart failure-Acute Systolic  History:         Patient has prior history of Echocardiogram examinations. PAD                  and Stroke, Signs/Symptoms:Shortness of Breath and Edema; Risk                  Factors:Dyslipidemia.  Sonographer:     Charmayne Sheer Referring Phys:  [id number] Gretta Cool PATEL Diagnosing Phys: Yolonda Kida MD  Sonographer Comments: Suboptimal parasternal window. IMPRESSIONS  1. Left ventricular ejection fraction, by estimation, is 25 to 30%. The left ventricle has severely decreased function. The left ventricle demonstrates global hypokinesis. The left ventricular internal cavity size was mildly dilated. Left ventricular diastolic parameters are consistent with Grade II diastolic dysfunction (pseudonormalization).  2. Right ventricular systolic function is normal. The right ventricular size is normal.  3. Left atrial size was mildly dilated.  4. The mitral valve is normal in structure. Trivial mitral valve regurgitation.  5. The aortic valve is grossly normal. Aortic valve regurgitation is not visualized. FINDINGS  Left Ventricle: Left ventricular ejection fraction, by estimation, is 25 to 30%. The left ventricle has severely decreased function. The left ventricle demonstrates global hypokinesis. The left ventricular internal cavity size was mildly dilated. There is no left ventricular hypertrophy. Left ventricular diastolic parameters are consistent with Grade II diastolic dysfunction (pseudonormalization). Right Ventricle: The right ventricular size is normal. No increase in right ventricular wall thickness. Right ventricular systolic function is normal. Left  Atrium: Left atrial size was mildly dilated. Right Atrium: Right atrial size was normal in size. Pericardium: There is no evidence of pericardial effusion. Mitral Valve: The mitral valve is normal in structure. Trivial mitral valve regurgitation. MV peak gradient, 3.0 mmHg. The mean mitral valve  gradient is 2.0 mmHg. Tricuspid Valve: The tricuspid valve is normal in structure. Tricuspid valve regurgitation is trivial. Aortic Valve: The aortic valve is grossly normal. Aortic valve regurgitation is not visualized. Aortic valve mean gradient measures 3.0 mmHg. Aortic valve peak gradient measures 4.0 mmHg. Aortic valve area, by VTI measures 2.52 cm. Pulmonic Valve: The pulmonic valve was normal in structure. Pulmonic valve regurgitation is not visualized. Aorta: The ascending aorta was not well visualized. IAS/Shunts: No atrial level shunt detected by color flow Doppler. Additional Comments: There is no pleural effusion.  LEFT VENTRICLE PLAX 2D LVIDd:         5.00 cm      Diastology LVIDs:         4.30 cm      LV e' medial:    4.68 cm/s LV PW:         1.20 cm      LV E/e' medial:  19.9 LV IVS:        1.00 cm      LV e' lateral:   7.40 cm/s LVOT diam:     2.10 cm      LV E/e' lateral: 12.6 LV SV:         59 LV SV Index:   28 LVOT Area:     3.46 cm  LV Volumes (MOD) LV vol d, MOD A2C: 175.0 ml LV vol d, MOD A4C: 120.0 ml LV vol s, MOD A2C: 126.0 ml LV vol s, MOD A4C: 91.3 ml LV SV MOD A2C:     49.0 ml LV SV MOD A4C:     120.0 ml LV SV MOD BP:      35.8 ml RIGHT VENTRICLE RV Basal diam:  2.90 cm LEFT ATRIUM             Index        RIGHT ATRIUM           Index LA diam:        4.10 cm 1.92 cm/m   RA Area:     12.60 cm LA Vol (A2C):   52.5 ml 24.56 ml/m  RA Volume:   29.40 ml  13.75 ml/m LA Vol (A4C):   64.0 ml 29.94 ml/m LA Biplane Vol: 58.3 ml 27.27 ml/m  AORTIC VALVE                    PULMONIC VALVE AV Area (Vmax):    3.14 cm     PV Vmax:       0.94 m/s AV Area (Vmean):   2.66 cm     PV Vmean:      69.500 cm/s  AV Area (VTI):     2.52 cm     PV VTI:        0.183 m AV Vmax:           100.00 cm/s  PV Peak grad:  3.5 mmHg AV Vmean:          79.500 cm/s  PV Mean grad:  2.0 mmHg AV VTI:            0.235 m AV Peak Grad:      4.0 mmHg AV Mean Grad:      3.0 mmHg LVOT Vmax:         90.70 cm/s LVOT Vmean:        61.100 cm/s LVOT VTI:          0.171 m LVOT/AV VTI ratio: 0.73  AORTA  Ao Root diam: 3.20 cm MITRAL VALVE MV Area (PHT): 6.83 cm    SHUNTS MV Area VTI:   2.93 cm    Systemic VTI:  0.17 m MV Peak grad:  3.0 mmHg    Systemic Diam: 2.10 cm MV Mean grad:  2.0 mmHg MV Vmax:       0.87 m/s MV Vmean:      72.3 cm/s MV Decel Time: 111 msec MV E velocity: 93.00 cm/s MV A velocity: 88.30 cm/s MV E/A ratio:  1.05 Geri Hepler D Duey Liller MD Electronically signed by Yolonda Kida MD Signature Date/Time: 01/30/2021/2:12:21 PM    Final     EKG: Sinus tach around 100 nonspecific T changes change  ASSESSMENT AND PLAN:  Shortness of breath Acute respiratory failure with hypoxemia GI bleed with melena and anemia Peptic ulcer disease Peripheral vascular disease Hypertension Hyperlipidemia Tobacco abuse Acute on chronic renal sufficiency . Plan Supplemental oxygen inhalers as necessary for shortness of breath Low-dose diuretic therapy for possible heart failure Echocardiogram for evaluation of left ventricular function wall motion Continue PPIs Protonix or omeprazole for reflux type symptoms Hold Xarelto switch to IV heparin because of elevated troponins Blood pressure management and control History of peripheral vascular disease intervention and surgery with femoral artery bypass continue anticoagulation Nephrology input for renal insufficiency Recommend GI as colonoscopy possible endoscopy as a moderate risk  Signed: Yolonda Kida MD,  02/11/2021, 11:46 AM

## 2021-02-14 DIAGNOSIS — I509 Heart failure, unspecified: Secondary | ICD-10-CM | POA: Diagnosis not present

## 2021-02-14 DIAGNOSIS — I130 Hypertensive heart and chronic kidney disease with heart failure and stage 1 through stage 4 chronic kidney disease, or unspecified chronic kidney disease: Secondary | ICD-10-CM | POA: Diagnosis not present

## 2021-02-14 DIAGNOSIS — Z23 Encounter for immunization: Secondary | ICD-10-CM | POA: Diagnosis not present

## 2021-02-14 DIAGNOSIS — N184 Chronic kidney disease, stage 4 (severe): Secondary | ICD-10-CM | POA: Diagnosis not present

## 2021-02-14 DIAGNOSIS — D500 Iron deficiency anemia secondary to blood loss (chronic): Secondary | ICD-10-CM | POA: Diagnosis not present

## 2021-02-22 DIAGNOSIS — K5909 Other constipation: Secondary | ICD-10-CM | POA: Diagnosis not present

## 2021-02-22 DIAGNOSIS — D509 Iron deficiency anemia, unspecified: Secondary | ICD-10-CM | POA: Diagnosis not present

## 2021-02-23 DIAGNOSIS — E782 Mixed hyperlipidemia: Secondary | ICD-10-CM | POA: Diagnosis not present

## 2021-02-23 DIAGNOSIS — I10 Essential (primary) hypertension: Secondary | ICD-10-CM | POA: Diagnosis not present

## 2021-02-23 DIAGNOSIS — I6523 Occlusion and stenosis of bilateral carotid arteries: Secondary | ICD-10-CM | POA: Diagnosis not present

## 2021-02-23 DIAGNOSIS — I5020 Unspecified systolic (congestive) heart failure: Secondary | ICD-10-CM | POA: Diagnosis not present

## 2021-02-23 DIAGNOSIS — I739 Peripheral vascular disease, unspecified: Secondary | ICD-10-CM | POA: Diagnosis not present

## 2021-02-23 DIAGNOSIS — Z8673 Personal history of transient ischemic attack (TIA), and cerebral infarction without residual deficits: Secondary | ICD-10-CM | POA: Diagnosis not present

## 2021-02-23 DIAGNOSIS — I420 Dilated cardiomyopathy: Secondary | ICD-10-CM | POA: Diagnosis not present

## 2021-02-23 DIAGNOSIS — N184 Chronic kidney disease, stage 4 (severe): Secondary | ICD-10-CM | POA: Diagnosis not present

## 2021-02-23 DIAGNOSIS — R778 Other specified abnormalities of plasma proteins: Secondary | ICD-10-CM | POA: Diagnosis not present

## 2021-02-28 DIAGNOSIS — I129 Hypertensive chronic kidney disease with stage 1 through stage 4 chronic kidney disease, or unspecified chronic kidney disease: Secondary | ICD-10-CM | POA: Diagnosis not present

## 2021-02-28 DIAGNOSIS — N1832 Chronic kidney disease, stage 3b: Secondary | ICD-10-CM | POA: Diagnosis not present

## 2021-02-28 DIAGNOSIS — I509 Heart failure, unspecified: Secondary | ICD-10-CM | POA: Diagnosis not present

## 2021-02-28 DIAGNOSIS — N179 Acute kidney failure, unspecified: Secondary | ICD-10-CM | POA: Diagnosis not present

## 2021-02-28 DIAGNOSIS — R809 Proteinuria, unspecified: Secondary | ICD-10-CM | POA: Diagnosis not present

## 2021-02-28 DIAGNOSIS — R319 Hematuria, unspecified: Secondary | ICD-10-CM | POA: Diagnosis not present

## 2021-03-02 ENCOUNTER — Encounter (INDEPENDENT_AMBULATORY_CARE_PROVIDER_SITE_OTHER): Payer: Medicare HMO | Admitting: Nurse Practitioner

## 2021-03-02 ENCOUNTER — Encounter (INDEPENDENT_AMBULATORY_CARE_PROVIDER_SITE_OTHER): Payer: Medicare HMO

## 2021-03-05 DIAGNOSIS — I5020 Unspecified systolic (congestive) heart failure: Secondary | ICD-10-CM | POA: Diagnosis not present

## 2021-03-06 DIAGNOSIS — N179 Acute kidney failure, unspecified: Secondary | ICD-10-CM | POA: Diagnosis not present

## 2021-03-06 DIAGNOSIS — D500 Iron deficiency anemia secondary to blood loss (chronic): Secondary | ICD-10-CM | POA: Diagnosis not present

## 2021-03-13 DIAGNOSIS — N289 Disorder of kidney and ureter, unspecified: Secondary | ICD-10-CM | POA: Diagnosis not present

## 2021-03-13 DIAGNOSIS — I509 Heart failure, unspecified: Secondary | ICD-10-CM | POA: Diagnosis not present

## 2021-03-14 ENCOUNTER — Encounter (INDEPENDENT_AMBULATORY_CARE_PROVIDER_SITE_OTHER): Payer: Medicare HMO | Admitting: Nurse Practitioner

## 2021-03-14 ENCOUNTER — Encounter (INDEPENDENT_AMBULATORY_CARE_PROVIDER_SITE_OTHER): Payer: Medicare HMO

## 2021-03-15 ENCOUNTER — Other Ambulatory Visit
Admission: RE | Admit: 2021-03-15 | Discharge: 2021-03-15 | Disposition: A | Discharge status: Home / Self Care | Payer: Medicare HMO | Source: Ambulatory Visit | Attending: Student | Admitting: Student

## 2021-03-15 DIAGNOSIS — I5023 Acute on chronic systolic (congestive) heart failure: Secondary | ICD-10-CM | POA: Diagnosis not present

## 2021-03-15 DIAGNOSIS — I420 Dilated cardiomyopathy: Secondary | ICD-10-CM | POA: Diagnosis not present

## 2021-03-15 DIAGNOSIS — Z01818 Encounter for other preprocedural examination: Secondary | ICD-10-CM | POA: Diagnosis not present

## 2021-03-15 DIAGNOSIS — R9439 Abnormal result of other cardiovascular function study: Secondary | ICD-10-CM | POA: Diagnosis not present

## 2021-03-15 DIAGNOSIS — I739 Peripheral vascular disease, unspecified: Secondary | ICD-10-CM | POA: Diagnosis not present

## 2021-03-15 DIAGNOSIS — I6523 Occlusion and stenosis of bilateral carotid arteries: Secondary | ICD-10-CM | POA: Diagnosis not present

## 2021-03-15 DIAGNOSIS — Z95828 Presence of other vascular implants and grafts: Secondary | ICD-10-CM | POA: Diagnosis not present

## 2021-03-15 DIAGNOSIS — I10 Essential (primary) hypertension: Secondary | ICD-10-CM | POA: Diagnosis not present

## 2021-03-15 DIAGNOSIS — Z8673 Personal history of transient ischemic attack (TIA), and cerebral infarction without residual deficits: Secondary | ICD-10-CM | POA: Diagnosis not present

## 2021-03-15 LAB — BRAIN NATRIURETIC PEPTIDE: B Natriuretic Peptide: 831.9 pg/mL — ABNORMAL HIGH (ref 0.0–100.0)

## 2021-03-20 ENCOUNTER — Other Ambulatory Visit (INDEPENDENT_AMBULATORY_CARE_PROVIDER_SITE_OTHER): Payer: Self-pay | Admitting: Nurse Practitioner

## 2021-03-20 DIAGNOSIS — I6523 Occlusion and stenosis of bilateral carotid arteries: Secondary | ICD-10-CM

## 2021-03-20 DIAGNOSIS — N261 Atrophy of kidney (terminal): Secondary | ICD-10-CM

## 2021-03-20 DIAGNOSIS — I701 Atherosclerosis of renal artery: Secondary | ICD-10-CM

## 2021-03-21 ENCOUNTER — Ambulatory Visit (INDEPENDENT_AMBULATORY_CARE_PROVIDER_SITE_OTHER): Payer: Medicare HMO | Admitting: Nurse Practitioner

## 2021-03-21 ENCOUNTER — Encounter (INDEPENDENT_AMBULATORY_CARE_PROVIDER_SITE_OTHER): Payer: Self-pay | Admitting: Nurse Practitioner

## 2021-03-21 ENCOUNTER — Encounter (INDEPENDENT_AMBULATORY_CARE_PROVIDER_SITE_OTHER): Payer: Medicare HMO

## 2021-03-21 ENCOUNTER — Ambulatory Visit (INDEPENDENT_AMBULATORY_CARE_PROVIDER_SITE_OTHER): Payer: Medicare HMO

## 2021-03-21 VITALS — BP 186/65 | HR 88 | Resp 16 | Wt 188.0 lb

## 2021-03-21 DIAGNOSIS — I6523 Occlusion and stenosis of bilateral carotid arteries: Secondary | ICD-10-CM | POA: Diagnosis not present

## 2021-03-21 DIAGNOSIS — I10 Essential (primary) hypertension: Secondary | ICD-10-CM

## 2021-03-21 DIAGNOSIS — F17200 Nicotine dependence, unspecified, uncomplicated: Secondary | ICD-10-CM | POA: Diagnosis not present

## 2021-03-21 DIAGNOSIS — I701 Atherosclerosis of renal artery: Secondary | ICD-10-CM

## 2021-03-21 DIAGNOSIS — E785 Hyperlipidemia, unspecified: Secondary | ICD-10-CM | POA: Diagnosis not present

## 2021-03-21 DIAGNOSIS — N261 Atrophy of kidney (terminal): Secondary | ICD-10-CM | POA: Diagnosis not present

## 2021-03-28 ENCOUNTER — Ambulatory Visit
Admission: RE | Admit: 2021-03-28 | Discharge: 2021-03-28 | Disposition: A | Discharge status: Home / Self Care | Payer: Medicare HMO | Source: Ambulatory Visit | Attending: Internal Medicine | Admitting: Internal Medicine

## 2021-03-28 ENCOUNTER — Encounter
Admission: RE | Disposition: A | Discharge status: Home / Self Care | Payer: Self-pay | Source: Ambulatory Visit | Attending: Internal Medicine

## 2021-03-28 ENCOUNTER — Encounter: Payer: Self-pay | Admitting: Internal Medicine

## 2021-03-28 DIAGNOSIS — I5032 Chronic diastolic (congestive) heart failure: Secondary | ICD-10-CM | POA: Insufficient documentation

## 2021-03-28 DIAGNOSIS — I2723 Pulmonary hypertension due to lung diseases and hypoxia: Secondary | ICD-10-CM | POA: Diagnosis not present

## 2021-03-28 DIAGNOSIS — R9439 Abnormal result of other cardiovascular function study: Secondary | ICD-10-CM | POA: Diagnosis present

## 2021-03-28 DIAGNOSIS — I25110 Atherosclerotic heart disease of native coronary artery with unstable angina pectoris: Secondary | ICD-10-CM | POA: Insufficient documentation

## 2021-03-28 DIAGNOSIS — N189 Chronic kidney disease, unspecified: Secondary | ICD-10-CM | POA: Insufficient documentation

## 2021-03-28 DIAGNOSIS — R0602 Shortness of breath: Secondary | ICD-10-CM | POA: Diagnosis not present

## 2021-03-28 DIAGNOSIS — I272 Pulmonary hypertension, unspecified: Secondary | ICD-10-CM | POA: Diagnosis not present

## 2021-03-28 HISTORY — PX: RIGHT/LEFT HEART CATH AND CORONARY ANGIOGRAPHY: CATH118266

## 2021-03-28 LAB — BASIC METABOLIC PANEL
Anion gap: 12 (ref 5–15)
BUN: 32 mg/dL — ABNORMAL HIGH (ref 8–23)
CO2: 24 mmol/L (ref 22–32)
Calcium: 9.2 mg/dL (ref 8.9–10.3)
Chloride: 101 mmol/L (ref 98–111)
Creatinine, Ser: 2.43 mg/dL — ABNORMAL HIGH (ref 0.61–1.24)
GFR, Estimated: 28 mL/min — ABNORMAL LOW (ref >60)
Glucose, Bld: 96 mg/dL (ref 70–99)
Potassium: 3.4 mmol/L — ABNORMAL LOW (ref 3.5–5.1)
Sodium: 137 mmol/L (ref 135–145)

## 2021-03-28 SURGERY — RIGHT/LEFT HEART CATH AND CORONARY ANGIOGRAPHY
Anesthesia: Moderate Sedation | Laterality: Bilateral

## 2021-03-28 MED ORDER — FUROSEMIDE 10 MG/ML IJ SOLN
INTRAMUSCULAR | Status: DC | PRN
  Administered 2021-03-28: 40 mg via INTRAVENOUS

## 2021-03-28 MED ORDER — ASPIRIN 81 MG PO CHEW
81.0000 mg | CHEWABLE_TABLET | ORAL | Status: AC
  Administered 2021-03-28: 81 mg via ORAL

## 2021-03-28 MED ORDER — MIDAZOLAM HCL 2 MG/2ML IJ SOLN
INTRAMUSCULAR | Status: AC
  Filled 2021-03-28: qty 2

## 2021-03-28 MED ORDER — ACETAMINOPHEN 325 MG PO TABS: 650.0000 mg | ORAL_TABLET | ORAL | Status: DC | PRN

## 2021-03-28 MED ORDER — SODIUM CHLORIDE 0.9 % IV BOLUS: 1000.0000 mL | Freq: Once | INTRAVENOUS | Status: DC

## 2021-03-28 MED ORDER — SODIUM CHLORIDE 0.9 % WEIGHT BASED INFUSION: 1.0000 mL/kg/h | INTRAVENOUS | Status: DC

## 2021-03-28 MED ORDER — SODIUM CHLORIDE 0.9 % IV SOLN: 250.0000 mL | INTRAVENOUS | Status: DC | PRN

## 2021-03-28 MED ORDER — VERAPAMIL HCL 2.5 MG/ML IV SOLN
INTRAVENOUS | Status: AC
  Filled 2021-03-28: qty 2

## 2021-03-28 MED ORDER — FENTANYL CITRATE (PF) 100 MCG/2ML IJ SOLN
INTRAMUSCULAR | Status: DC | PRN
  Administered 2021-03-28 (×2): 25 ug via INTRAVENOUS

## 2021-03-28 MED ORDER — FUROSEMIDE 10 MG/ML IJ SOLN: 40.0000 mg | Freq: Once | INTRAMUSCULAR | Status: DC

## 2021-03-28 MED ORDER — LABETALOL HCL 5 MG/ML IV SOLN: 10.0000 mg | INTRAVENOUS | Status: DC | PRN

## 2021-03-28 MED ORDER — SODIUM CHLORIDE 0.9 % IV BOLUS
500.0000 mL | Freq: Once | INTRAVENOUS | Status: AC
  Administered 2021-03-28: 500 mL via INTRAVENOUS

## 2021-03-28 MED ORDER — HYDRALAZINE HCL 20 MG/ML IJ SOLN: 10.0000 mg | INTRAMUSCULAR | Status: DC | PRN

## 2021-03-28 MED ORDER — SODIUM CHLORIDE 0.9% FLUSH: 3.0000 mL | Freq: Two times a day (BID) | INTRAVENOUS | Status: DC

## 2021-03-28 MED ORDER — ASPIRIN 81 MG PO CHEW
CHEWABLE_TABLET | ORAL | Status: AC
  Filled 2021-03-28: qty 1

## 2021-03-28 MED ORDER — MIDAZOLAM HCL 2 MG/2ML IJ SOLN
INTRAMUSCULAR | Status: DC | PRN
  Administered 2021-03-28 (×2): 1 mg via INTRAVENOUS

## 2021-03-28 MED ORDER — FUROSEMIDE 10 MG/ML IJ SOLN
INTRAMUSCULAR | Status: AC
  Filled 2021-03-28: qty 4

## 2021-03-28 MED ORDER — VERAPAMIL HCL 2.5 MG/ML IV SOLN
INTRAVENOUS | Status: DC | PRN
  Administered 2021-03-28: 2.5 mg via INTRA_ARTERIAL

## 2021-03-28 MED ORDER — SODIUM CHLORIDE 0.9 % IV BOLUS
INTRAVENOUS | Status: DC | PRN
  Administered 2021-03-28: 100 mL via INTRAVENOUS

## 2021-03-28 MED ORDER — HEPARIN SODIUM (PORCINE) 1000 UNIT/ML IJ SOLN
INTRAMUSCULAR | Status: AC
  Filled 2021-03-28: qty 10

## 2021-03-28 MED ORDER — HEPARIN (PORCINE) IN NACL 1000-0.9 UT/500ML-% IV SOLN
INTRAVENOUS | Status: AC
  Filled 2021-03-28: qty 1000

## 2021-03-28 MED ORDER — FENTANYL CITRATE (PF) 100 MCG/2ML IJ SOLN
INTRAMUSCULAR | Status: AC
  Filled 2021-03-28: qty 2

## 2021-03-28 MED ORDER — HEPARIN SODIUM (PORCINE) 1000 UNIT/ML IJ SOLN
INTRAMUSCULAR | Status: DC | PRN
  Administered 2021-03-28: 4000 [IU] via INTRAVENOUS

## 2021-03-28 MED ORDER — IOHEXOL 300 MG/ML  SOLN
INTRAMUSCULAR | Status: DC | PRN
  Administered 2021-03-28: 100 mL

## 2021-03-28 MED ORDER — SODIUM CHLORIDE 0.9% FLUSH: 3.0000 mL | INTRAVENOUS | Status: DC | PRN

## 2021-03-28 MED ORDER — HEPARIN (PORCINE) IN NACL 1000-0.9 UT/500ML-% IV SOLN
INTRAVENOUS | Status: DC | PRN
  Administered 2021-03-28 (×2): 500 mL

## 2021-03-28 MED ORDER — ONDANSETRON HCL 4 MG/2ML IJ SOLN: 4.0000 mg | Freq: Four times a day (QID) | INTRAMUSCULAR | Status: DC | PRN

## 2021-03-28 MED ORDER — LIDOCAINE HCL 1 % IJ SOLN
INTRAMUSCULAR | Status: AC
  Filled 2021-03-28: qty 20

## 2021-03-28 MED ORDER — SODIUM CHLORIDE 0.9 % WEIGHT BASED INFUSION: 3.0000 mL/kg/h | INTRAVENOUS | Status: AC

## 2021-03-28 MED ORDER — LIDOCAINE HCL (PF) 1 % IJ SOLN
INTRAMUSCULAR | Status: DC | PRN
  Administered 2021-03-28: 2 mL

## 2021-03-28 SURGICAL SUPPLY — 15 items
CATH BALLN WEDGE 5F 110CM (CATHETERS) ×1 IMPLANT
CATH EXPO 5FR FR4 (CATHETERS) ×1 IMPLANT
CATH INFINITI 5 FR JL3.5 (CATHETERS) ×1 IMPLANT
DEVICE RAD TR BAND REGULAR (VASCULAR PRODUCTS) ×2 IMPLANT
DRAPE BRACHIAL (DRAPES) ×2 IMPLANT
GLIDESHEATH SLEND A-KIT 6F 22G (SHEATH) ×1 IMPLANT
GLIDESHEATH SLEND SS 6F .021 (SHEATH) ×1 IMPLANT
GUIDEWIRE INQWIRE 1.5J.035X260 (WIRE) IMPLANT
INQWIRE 1.5J .035X260CM (WIRE) ×2
PACK CARDIAC CATH (CUSTOM PROCEDURE TRAY) ×2 IMPLANT
PROTECTION STATION PRESSURIZED (MISCELLANEOUS) ×2
SET ATX SIMPLICITY (MISCELLANEOUS) ×1 IMPLANT
SHEATH GLIDE SLENDER 4/5FR (SHEATH) ×1 IMPLANT
STATION PROTECTION PRESSURIZED (MISCELLANEOUS) IMPLANT
WIRE NITINOL .018 (WIRE) ×1 IMPLANT

## 2021-03-29 ENCOUNTER — Encounter: Payer: Self-pay | Admitting: Internal Medicine

## 2021-03-31 ENCOUNTER — Encounter (INDEPENDENT_AMBULATORY_CARE_PROVIDER_SITE_OTHER): Payer: Self-pay | Admitting: Nurse Practitioner

## 2021-03-31 NOTE — Progress Notes (Signed)
Subjective:    Patient ID: Gary Mccoy., male    DOB: 07/24/1950, 71 y.o.   MRN: [id number] Chief Complaint  Patient presents with   Follow-up    Ultrasound follow up    Gary Mccoy is a 71 year old male that presents today for evaluation of possible renal artery stenosis.  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  The patient also has a previous history of peripheral arterial disease as well as carotid artery stenosis.  He notes for the last 3 years or so the kidney function has been off.  Today, noninvasive study showed no flow within the right renal artery.  Left renal artery has no evidence of stenosis.  The right kidney is measured at 6.85 cm whereas the left is measured at 11.09.  The size of the left kidney is normal.   Review of Systems  Cardiovascular:  Negative for leg swelling.  All other systems reviewed and are negative.     Objective:   Physical Exam Vitals reviewed.  HENT:     Head: Normocephalic.  Cardiovascular:     Rate and Rhythm: Normal rate.  Pulmonary:     Effort: Pulmonary effort is normal.  Skin:    General: Skin is warm and dry.  Neurological:     Mental Status: He is alert and oriented to person, place, and time.  Psychiatric:        Mood and Affect: Mood normal.        Behavior: Behavior normal.        Thought Content: Thought content normal.        Judgment: Judgment normal.    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    Complication of anesthesia    Violent with hallucinations after procedure 10/2015   DDD (degenerative disc disease), lumbar    Duodenal ulcer    Elevated lipids    GERD  (gastroesophageal reflux disease)    Leg weakness, bilateral    s/p lumbar surgery   PAD (peripheral artery disease) ([place])    Polyradiculitis    Stroke ([place])    Rt lower leg  estimated 10 - 12 stokes in last 5 years   Wears dentures    full upper and lower    Social History   Socioeconomic History   Marital status: Married    Spouse name: Not on file   Number of children: Not on file   Years of education: Not on file   Highest education level: Not on file  Occupational History   Not on file  Tobacco Use   Smoking status: Every Day    Packs/day: 1.00    Years: 45.00    Pack years: 45.00    Types: Cigarettes   Smokeless tobacco: Never   Tobacco comments:    09/21/20- currently 0.5 PPD  Vaping Use   Vaping Use: Never used  Substance and Sexual Activity   Alcohol use: Yes    Comment: rare 3 a year   Drug use: No  Sexual activity: Not on file  Other Topics Concern   Not on file  Social History Narrative   Not on file   Social Determinants of Health   Financial Resource Strain: Not on file  Food Insecurity: Not on file  Transportation Needs: Not on file  Physical Activity: Not on file  Stress: Not on file  Social Connections: Not on file  Intimate Partner Violence: Not on file    Past Surgical History:  Procedure Laterality Date   AORTA - BILATERAL FEMORAL ARTERY BYPASS GRAFT N/A 11/08/2015   Procedure: AORTA BIFEMORAL BYPASS GRAFT;  Surgeon: Katha Cabal, MD;  Location: [facility];  Service: Vascular;  Laterality: N/A;   [place]   COLONOSCOPY WITH PROPOFOL N/A 12/30/2017   Procedure: COLONOSCOPY WITH PROPOFOL;  Surgeon: Toledo, Benay Pike, MD;  Location: [facility];  Service: Gastroenterology;  Laterality: N/A;   COLONOSCOPY WITH PROPOFOL N/A 02/02/2021   Procedure: COLONOSCOPY WITH PROPOFOL;  Surgeon: Lesly Rubenstein, MD;  Location: [facility];  Service: Endoscopy;  Laterality: N/A;  [place]    ENDARTERECTOMY FEMORAL Bilateral 11/08/2015   Procedure: ENDARTERECTOMY FEMORAL;  Surgeon: Katha Cabal, MD;  Location: [facility];  Service: Vascular;  Laterality: Bilateral;  common, SFA, and profundis  Aortic endarterectomy    ESOPHAGOGASTRODUODENOSCOPY (EGD) WITH PROPOFOL N/A 09/04/2015   Procedure: ESOPHAGOGASTRODUODENOSCOPY (EGD) WITH PROPOFOL;  Surgeon: Lucilla Lame, MD;  Location: [place];  Service: Endoscopy;  Laterality: N/A;   ESOPHAGOGASTRODUODENOSCOPY (EGD) WITH PROPOFOL N/A 02/02/2021   Procedure: ESOPHAGOGASTRODUODENOSCOPY (EGD) WITH PROPOFOL;  Surgeon: Lesly Rubenstein, MD;  Location: [facility];  Service: Endoscopy;  Laterality: N/A;  [place]   LUMBAR LAMINECTOMY  2006   RIGHT/LEFT HEART CATH AND CORONARY ANGIOGRAPHY Bilateral 03/28/2021   Procedure: RIGHT/LEFT HEART CATH AND CORONARY ANGIOGRAPHY;  Surgeon: Yolonda Kida, MD;  Location: [place] [facility];  Service: Cardiovascular;  Laterality: Bilateral;   SEPTOPLASTY N/A 09/28/2020   Procedure: SEPTOPLASTY;  Surgeon: Margaretha Sheffield, MD;  Location: [place];  Service: ENT;  Laterality: N/A;   TEE WITHOUT CARDIOVERSION N/A 09/22/2015   Procedure: TRANSESOPHAGEAL ECHOCARDIOGRAM (TEE);  Surgeon: Teodoro Spray, MD;  Location: [facility];  Service: Cardiovascular;  Laterality: N/A;   TURBINATE REDUCTION Bilateral 09/28/2020   Procedure: TURBINATE REDUCTION;  Surgeon: Margaretha Sheffield, MD;  Location: [place];  Service: ENT;  Laterality: Bilateral;    Family History  Problem Relation Age of Onset   Heart attack Mother    Hypertension Mother    Varicose Veins Mother    Diabetes Father    Heart attack Father    Heart attack Maternal Grandmother    Stroke Maternal Grandmother     No Known Allergies  CBC Latest Ref Rng & Units 02/05/2021 02/03/2021 02/02/2021  WBC 4.0 - 10.5 K/uL - - 13.9(H)  Hemoglobin 13.0 - 17.0 g/dL 10.5(L) 10.2(L) 10.2(L)  Hematocrit 39.0 - 52.0 % 30.7(L) 30.7(L)  30.5(L)  Platelets 150 - 400 K/uL - - 209      CMP     Component Value Date/Time   NA 137 03/28/2021 1124   K 3.4 (L) 03/28/2021 1124   CL 101 03/28/2021 1124   CO2 24 03/28/2021 1124   GLUCOSE 96 03/28/2021 1124   BUN 32 (H) 03/28/2021 1124   CREATININE 2.43 (H) 03/28/2021 1124   CALCIUM 9.2 03/28/2021 1124   PROT 7.2 01/31/2021 0434   ALBUMIN 4.0 01/31/2021 0434   AST 39  01/31/2021 0434   ALT 24 01/31/2021 0434   ALKPHOS 69 01/31/2021 0434   BILITOT 1.0 01/31/2021 0434   GFRNONAA 28 (L) 03/28/2021 1124   GFRAA 48 (L) 02/26/2018 1359     No results found.     Assessment & Plan:   1. Renal artery stenosis (HCC) Today noninvasive studies show that the patient has an occluded right renal artery however the right kidney is less than 7 cm in size.  This is an indication that the kidney itself is nonfunctional.  Therefore, intervention would not improve any function for the right kidney and in fact it could damage the remaining function of the left due to use of contrast dye.  Noninvasive studies show today that the left kidney is of normal size with no evidence of stenosis of the renal artery.  We will continue to monitor the patient for PAD and carotid stenosis.  2. Hyperlipidemia, unspecified hyperlipidemia type Continue statin as ordered and reviewed, no changes at this time   3. Benign essential HTN Continue antihypertensive medications as already ordered, these medications have been reviewed and there are no changes at this time.   4. Bilateral carotid artery stenosis Currently stable we will continue to monitor at next follow-up.  5. Tobacco use disorder Smoking cessation was discussed, 3-10 minutes spent on this topic specifically    Current Outpatient Medications on File Prior to Visit  Medication Sig Dispense Refill   albuterol (VENTOLIN HFA) 108 (90 Base) MCG/ACT inhaler Inhale 2 puffs into the lungs every 6 (six) hours as needed for wheezing or shortness of  breath. 8 g 2   amLODipine (NORVASC) 10 MG tablet Take 1 tablet (10 mg total) by mouth daily. 30 tablet 2   aspirin EC 81 MG tablet Take 81 mg by mouth daily.     Coenzyme Q10 (COQ10) 200 MG CAPS Take 200 mg by mouth daily.     furosemide (LASIX) 20 MG tablet Take 2 tablets (40 mg total) by mouth daily as needed. for weight gain or swelling (Patient taking differently: Take 20 mg by mouth daily. Additional 20 ng if needed for weight gain or swelling) 30 tablet 2   hydrALAZINE (APRESOLINE) 50 MG tablet Take 1 tablet (50 mg total) by mouth every 8 (eight) hours. 90 tablet 2   Iron-Vitamin C (VITRON-C) 65-125 MG TABS Take 1 tablet by mouth daily.     isosorbide mononitrate (IMDUR) 60 MG 24 hr tablet Take 1 tablet (60 mg total) by mouth daily. 30 tablet 2   metoprolol succinate (TOPROL-XL) 25 MG 24 hr tablet Take 1 tablet (25 mg total) by mouth daily. 30 tablet 2   Multiple Vitamins-Minerals (MULTIVITAMIN ADULT PO) Take 1 tablet by mouth daily. Men     Omega-3 Fatty Acids (FISH OIL PO) Take by mouth.     pantoprazole (PROTONIX) 40 MG tablet Take 40 mg by mouth daily.     Rivaroxaban (XARELTO) 15 MG TABS tablet Take 1 tablet (15 mg total) by mouth daily. 30 tablet 2   rosuvastatin (CRESTOR) 40 MG tablet Take 20 mg by mouth daily.     vitamin B-12 (CYANOCOBALAMIN) 1000 MCG tablet Take 1,000 mcg by mouth daily.     No current facility-administered medications on file prior to visit.    There are no Patient Instructions on file for this visit. No follow-ups on file.   Kris Hartmann, NP

## 2021-04-10 ENCOUNTER — Encounter: Payer: Self-pay | Admitting: Internal Medicine

## 2021-04-10 LAB — CARDIAC CATHETERIZATION: Cath EF Quantitative: 60 %

## 2021-04-13 DIAGNOSIS — N184 Chronic kidney disease, stage 4 (severe): Secondary | ICD-10-CM | POA: Diagnosis not present

## 2021-04-13 DIAGNOSIS — Z1152 Encounter for screening for COVID-19: Secondary | ICD-10-CM | POA: Diagnosis not present

## 2021-04-13 DIAGNOSIS — Z20822 Contact with and (suspected) exposure to covid-19: Secondary | ICD-10-CM | POA: Diagnosis not present

## 2021-04-13 DIAGNOSIS — I429 Cardiomyopathy, unspecified: Secondary | ICD-10-CM | POA: Diagnosis not present

## 2021-04-16 ENCOUNTER — Encounter (INDEPENDENT_AMBULATORY_CARE_PROVIDER_SITE_OTHER): Payer: Medicare HMO

## 2021-04-16 ENCOUNTER — Ambulatory Visit (INDEPENDENT_AMBULATORY_CARE_PROVIDER_SITE_OTHER): Payer: Medicare HMO | Admitting: Vascular Surgery

## 2021-04-16 DIAGNOSIS — I5023 Acute on chronic systolic (congestive) heart failure: Secondary | ICD-10-CM | POA: Diagnosis not present

## 2021-04-16 DIAGNOSIS — I25118 Atherosclerotic heart disease of native coronary artery with other forms of angina pectoris: Secondary | ICD-10-CM | POA: Diagnosis not present

## 2021-04-16 DIAGNOSIS — I739 Peripheral vascular disease, unspecified: Secondary | ICD-10-CM | POA: Diagnosis not present

## 2021-04-16 DIAGNOSIS — R9430 Abnormal result of cardiovascular function study, unspecified: Secondary | ICD-10-CM | POA: Diagnosis not present

## 2021-04-16 DIAGNOSIS — I129 Hypertensive chronic kidney disease with stage 1 through stage 4 chronic kidney disease, or unspecified chronic kidney disease: Secondary | ICD-10-CM | POA: Diagnosis not present

## 2021-04-16 DIAGNOSIS — I6523 Occlusion and stenosis of bilateral carotid arteries: Secondary | ICD-10-CM | POA: Diagnosis not present

## 2021-04-16 DIAGNOSIS — R9431 Abnormal electrocardiogram [ECG] [EKG]: Secondary | ICD-10-CM | POA: Diagnosis not present

## 2021-04-16 DIAGNOSIS — F17210 Nicotine dependence, cigarettes, uncomplicated: Secondary | ICD-10-CM | POA: Diagnosis not present

## 2021-04-16 DIAGNOSIS — N184 Chronic kidney disease, stage 4 (severe): Secondary | ICD-10-CM | POA: Diagnosis not present

## 2021-04-16 DIAGNOSIS — R9439 Abnormal result of other cardiovascular function study: Secondary | ICD-10-CM | POA: Diagnosis not present

## 2021-04-16 DIAGNOSIS — I70219 Atherosclerosis of native arteries of extremities with intermittent claudication, unspecified extremity: Secondary | ICD-10-CM | POA: Insufficient documentation

## 2021-04-16 DIAGNOSIS — I420 Dilated cardiomyopathy: Secondary | ICD-10-CM | POA: Diagnosis not present

## 2021-04-16 DIAGNOSIS — I493 Ventricular premature depolarization: Secondary | ICD-10-CM | POA: Diagnosis not present

## 2021-04-16 DIAGNOSIS — E785 Hyperlipidemia, unspecified: Secondary | ICD-10-CM | POA: Diagnosis not present

## 2021-04-16 HISTORY — PX: CORONARY ANGIOPLASTY WITH STENT PLACEMENT: SHX49

## 2021-04-16 NOTE — Progress Notes (Signed)
MRN : [id number]  Gary Mccoy. is a 71 y.o. ([birth year]) male who presents with chief complaint of check circulation.  History of Present Illness:   The patient returns to the office for followup and review of the noninvasive studies. There have been no interval changes in lower extremity symptoms. No interval shortening of the patient's claudication distance or development of rest pain symptoms. No new ulcers or wounds have occurred since the last visit.   The patient denies amaurosis fugax or recent TIA symptoms. There are no further neurological changes noted since DC in December s/p CVA.   There has been a significant change to the patient's overall health care.  He is s/p PCI to right coronary on 04/16/2021.   The patient denies history of DVT, PE or superficial thrombophlebitis. The patient denies recent episodes of angina or shortness of breath.    ABI Rt=0.74 and Lt=1.05  (previous ABI's Rt=0.65 and Lt=0.80 ) Duplex ultrasound of the aorta iliac arteries shows the ABF bypass is patent no hemodynamically significant stenosis identified, No change since last study  Carotid duplex shows RICA 83-09% and LICA 40-76% previous occlusion of the left common carotid is not identified this study flow is identified today which is more consistent with the findings in the LICA.    No outpatient medications have been marked as taking for the 04/19/21 encounter (Appointment) with Delana Meyer, Dolores Lory, MD.    Past Medical History:  Diagnosis Date   Arthritis    lower back   Back pain    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    Complication of anesthesia    Violent with hallucinations after procedure 10/2015   DDD (degenerative disc disease), lumbar    Duodenal ulcer    Elevated lipids    GERD (gastroesophageal reflux disease)    Leg weakness, bilateral    s/p lumbar surgery   PAD (peripheral artery disease) ([place])    Polyradiculitis    Stroke ([place])    Rt lower leg  estimated 10 - 12 stokes in  last 5 years   Wears dentures    full upper and lower    Past Surgical History:  Procedure Laterality Date   AORTA - BILATERAL FEMORAL ARTERY BYPASS GRAFT N/A 11/08/2015   Procedure: AORTA BIFEMORAL BYPASS GRAFT;  Surgeon: Katha Cabal, MD;  Location: [facility];  Service: Vascular;  Laterality: N/A;   BACK SURGERY  2005   CERVICAL [place]   COLONOSCOPY WITH PROPOFOL N/A 12/30/2017   Procedure: COLONOSCOPY WITH PROPOFOL;  Surgeon: Tol[place], Benay Pike, MD;  Location: [facility];  Service: Gastroenterology;  Laterality: N/A;   COLONOSCOPY WITH PROPOFOL N/A 02/02/2021   Procedure: COLONOSCOPY WITH PROPOFOL;  Surgeon: Lesly Rubenstein, MD;  Location: [facility];  Service: Endoscopy;  Laterality: N/A;  [place]   ENDARTERECTOMY FEMORAL Bilateral 11/08/2015   Procedure: ENDARTERECTOMY FEMORAL;  Surgeon: Katha Cabal, MD;  Location: [facility];  Service: Vascular;  Laterality: Bilateral;  common, SFA, and profundis  Aortic endarterectomy    ESOPHAGOGASTRODUODENOSCOPY (EGD) WITH PROPOFOL N/A 09/04/2015   Procedure: ESOPHAGOGASTRODUODENOSCOPY (EGD) WITH PROPOFOL;  Surgeon: Lucilla Lame, MD;  Location: [place];  Service: Endoscopy;  Laterality: N/A;   ESOPHAGOGASTRODUODENOSCOPY (EGD) WITH PROPOFOL N/A 02/02/2021   Procedure: ESOPHAGOGASTRODUODENOSCOPY (EGD) WITH PROPOFOL;  Surgeon: Lesly Rubenstein, MD;  Location: [facility];  Service: Endoscopy;  Laterality: N/A;  [place]   LUMBAR LAMINECTOMY  2006   RIGHT/LEFT HEART CATH AND CORONARY ANGIOGRAPHY Bilateral 03/28/2021  Procedure: RIGHT/LEFT HEART CATH AND CORONARY ANGIOGRAPHY;  Surgeon: Yolonda Kida, MD;  Location: [place] [facility];  Service: Cardiovascular;  Laterality: Bilateral;   SEPTOPLASTY N/A 09/28/2020   Procedure: SEPTOPLASTY;  Surgeon: Margaretha Sheffield, MD;  Location: [place];  Service: ENT;  Laterality: N/A;   TEE WITHOUT CARDIOVERSION N/A 09/22/2015   Procedure: TRANSESOPHAGEAL  ECHOCARDIOGRAM (TEE);  Surgeon: Teodoro Spray, MD;  Location: [facility];  Service: Cardiovascular;  Laterality: N/A;   TURBINATE REDUCTION Bilateral 09/28/2020   Procedure: TURBINATE REDUCTION;  Surgeon: Margaretha Sheffield, MD;  Location: [place];  Service: ENT;  Laterality: Bilateral;    Social History Social History   Tobacco Use   Smoking status: Every Day    Packs/day: 1.00    Years: 45.00    Pack years: 45.00    Types: Cigarettes   Smokeless tobacco: Never   Tobacco comments:    09/21/20- currently 0.5 PPD  Vaping Use   Vaping Use: Never used  Substance Use Topics   Alcohol use: Yes    Comment: rare 3 a year   Drug use: No    Family History Family History  Problem Relation Age of Onset   Heart attack Mother    Hypertension Mother    Varicose Veins Mother    Diabetes Father    Heart attack Father    Heart attack Maternal Grandmother    Stroke Maternal Grandmother     No Known Allergies   REVIEW OF SYSTEMS (Negative unless checked)  Constitutional: _0 Weight loss  _1 Fever  _2 Chills Cardiac: _3 Chest pain   _4 Chest pressure   _5 Palpitations   _6 Shortness of breath when laying flat   _7 Shortness of breath with exertion. Vascular:  _8 Pain in legs with walking   _9 Pain in legs at rest  _10 History of DVT   _11 Phlebitis   _12 Swelling in legs   _13 Varicose veins   _14 Non-healing ulcers Pulmonary:   _15 Uses home oxygen   _16 Productive cough   _17 Hemoptysis   _18 Wheeze  _19 COPD   _20 Asthma Neurologic:  _21 Dizziness   _22 Seizures   _23 History of stroke   _24 History of TIA  _25 Aphasia   _26 Vissual changes   _27 Weakness or numbness in arm   _28 Weakness or numbness in leg Musculoskeletal:   _29 Joint swelling   _30 Joint pain   _31 Low back pain Hematologic:  _32 Easy bruising  _33 Easy bleeding   _34 Hypercoagulable state   _35 Anemic Gastrointestinal:  _36 Diarrhea   _37 Vomiting  _38 Gastroesophageal reflux/heartburn   _39 Difficulty swallowing. Genitourinary:  _40 Chronic kidney disease   _41 Difficult urination   _42 Frequent urination   _43 Blood in urine Skin:  _44 Rashes   _45 Ulcers  Psychological:  _46 History of anxiety   _47  History of major depression.  Physical Examination  There were no vitals filed for this visit. There is no height or weight on file to calculate BMI. Gen: WD/WN, NAD Head: [place]/AT, No temporalis wasting.  Ear/Nose/Throat: Hearing grossly intact, nares w/o erythema or drainage Eyes: PER, EOMI, sclera nonicteric.  Neck: Supple, no masses.  No bruit or JVD.  Pulmonary:  Good air movement, no audible wheezing, no use of accessory muscles.  Cardiac: RRR, normal S1, S2, no Murmurs. Vascular:   Vessel Right Left  Radial Palpable Palpable  Carotid Palpable Palpable  PT Not Palpable Not Palpable  DP Not Palpable Not Palpable  Gastrointestinal: soft, non-distended. No guarding/no peritoneal signs.  Musculoskeletal: M/S 5/5 throughout.  No visible deformity.  Neurologic: CN 2-12 intact. Pain and light touch intact in extremities.  Symmetrical.  Speech is  fluent. Motor exam as listed above. Psychiatric: Judgment intact, Mood & affect appropriate for pt's clinical situation. Dermatologic: No rashes or ulcers noted.  No changes consistent with cellulitis.   CBC Lab Results  Component Value Date   WBC 13.9 (H) 02/02/2021   HGB 10.5 (L) 02/05/2021   HCT 30.7 (L) 02/05/2021   MCV 88.4 02/02/2021   PLT 209 02/02/2021    BMET    Component Value Date/Time   NA 137 03/28/2021 1124   K 3.4 (L) 03/28/2021 1124   CL 101 03/28/2021 1124   CO2 24 03/28/2021 1124   GLUCOSE 96 03/28/2021 1124   BUN 32 (H) 03/28/2021 1124   CREATININE 2.43 (H) 03/28/2021 1124   CALCIUM 9.2 03/28/2021 1124   GFRNONAA 28 (L) 03/28/2021 1124   GFRAA 48 (L) 02/26/2018 1359   CrCl cannot be calculated (Unknown ideal weight.).  COAG Lab Results  Component Value Date   INR 1.9 (H) 01/29/2021   INR 2.24 02/26/2018   INR 1.21 11/08/2015    Radiology CARDIAC CATHETERIZATION  Addendum Date: 04/10/2021      Mid LM to Dist LM lesion is 50% stenosed.   Ost RCA lesion is 70% stenosed.   Prox RCA-1 lesion is 80% stenosed.   Prox RCA-2 lesion is 80% stenosed.   Dist LAD-2 lesion is 50% stenosed.   Dist LAD-1 lesion is 50% stenosed.   1st Diag lesion is 70% stenosed.   Ost Cx lesion is 50% stenosed.   The left ventricular systolic function is normal.   LV end diastolic pressure is normal.   The left ventricular ejection fraction is 55-65% by visual estimate.   Hemodynamic findings consistent with moderate pulmonary hypertension. Right heart cath right brachial Wedge 29/36 mean of 24 PA 53/24 mean of 38 RV 96/2 end-diastolic of 16 RA 83/66 mean of 14 LV 294/76 end-diastolic of 28 Ao 546/50 Ao sat 96% PA sat 70% Cardiac output Fick 6.38 Index 3.13 Measurement suggests moderate pulmonary hypertension Left heart cath right ulnar Normal left ventricular function EF of around 60% Coronaries Left main large minor disease ostially 40-50% distal at bifurcation LAD diffuse 50% LAD lesion proximal mid and distal Diagonal 1 small to medium 70% proximal Circumflex medium to large size with minor irregularities RCA large 50% proximal 80% proximal 80% proximal serial lesions Patient tolerated procedure well TR band applied Plan Refer the patient to pulmonary for moderate pulm hypertension Consider referral to tertiary care center for evaluation of distal left main as well as RCA and ostial circumflex for possible intervention  vs coronary bypass surgery   Result Date: 04/10/2021   Mid LM to Dist LM lesion is 50% stenosed.   Ost RCA lesion is 70% stenosed.   Prox RCA-1 lesion is 80% stenosed.   Prox RCA-2 lesion is 80% stenosed.   Dist LAD-2 lesion is 50% stenosed.   Dist LAD-1 lesion is 50% stenosed.   1st Diag lesion is 70% stenosed.   Ost Cx lesion is 50% stenosed.   The left ventricular systolic function is normal.   LV end diastolic pressure is normal.   The left ventricular ejection fraction is 55-65% by visual estimate.    Hemodynamic findings consistent with moderate pulmonary hypertension. Right heart cath right brachial Wedge 29/36 mean of 24 PA 53/24 mean of 38 RV 35/4 end-diastolic of 16 RA 65/68 mean of 14 LV 127/51 end-diastolic of 28 Ao 700/17 Ao sat 96% PA sat 70% Cardiac output Fick 6.38 Index 3.13 Measurement suggests  moderate pulmonary hypertension Left heart cath right ulnar Normal left ventricular function EF of around 60% Coronaries Left main large minor disease ostially 40-50% distal at bifurcation LAD diffuse 50% LAD lesion proximal mid and distal Diagonal 1 small to medium 70% proximal Circumflex medium to large size with minor irregularities RCA large 50% proximal 80% proximal 80% proximal serial lesions Patient tolerated procedure well TR band applied Plan Refer the patient to pulmonary for moderate pulm hypertension Consider referral to tertiary care center for evaluation of distal left main as well as RCA and ostial circumflex for possible intervention  vs coronary bypass surgery   VAS US RENAL ARTERY DUPLEX  Result Date: 03/22/2021 ABDOMINAL VISCERAL Patient Name:  Gary Mccoy.  Date of Exam:   03/21/2021 Medical Rec #: [id number]              Accession #:    [id number] Date of Birth: 1951/03/02              Patient Gender: M Patient Age:   49 years Exam Location:  [facility] Procedure:      VAS US RENAL ARTERY DUPLEX Referring Phys: Kris Hartmann -------------------------------------------------------------------------------- Indications: rt kidney small, abnormal labs Performing Technologist: Concha Norway RVT  Examination Guidelines: A complete evaluation includes B-mode imaging, spectral Doppler, color Doppler, and power Doppler as needed of all accessible portions of each vessel. Bilateral testing is considered an integral part of a complete examination. Limited examinations for reoccurring indications may be performed as noted.  Duplex Findings:  +----------+--------+--------+------+--------+  Mesenteric PSV cm/s EDV cm/s Plaque Comments  +----------+--------+--------+------+--------+  Aorta Mid     80                              +----------+--------+--------+------+--------+    +------------------+--------+--------+------------+  Right Renal Artery PSV cm/s EDV cm/s   Comment     +------------------+--------+--------+------------+  Origin                               no flow seen  +------------------+--------+--------+------------+ +-----------------+--------+--------+-------+  Left Renal Artery PSV cm/s EDV cm/s Comment  +-----------------+--------+--------+-------+  Proximal             53                      +-----------------+--------+--------+-------+  Mid                  47                      +-----------------+--------+--------+-------+  Distal               98                      +-----------------+--------+--------+-------+ +------------+--------+--------+--+-----------+--------+--------+---+  Right Kidney PSV cm/s EDV cm/s RI Left Kidney PSV cm/s EDV cm/s RI   +------------+--------+--------+--+-----------+--------+--------+---+  Upper Pole                        Upper Pole                         +------------+--------+--------+--+-----------+--------+--------+---+  Mid  Mid                                +------------+--------+--------+--+-----------+--------+--------+---+  Lower Pole                        Lower Pole                         +------------+--------+--------+--+-----------+--------+--------+---+  Hilar                             Hilar                              +------------+--------+--------+--+-----------+--------+--------+---+ +------------------+-------+------------------+-----+  Right Kidney               Left Kidney               +------------------+-------+------------------+-----+  RAR                        RAR                        +------------------+-------+------------------+-----+  RAR (manual)               RAR (manual)       1.23   +------------------+-------+------------------+-----+  Cortex                     Cortex                    +------------------+-------+------------------+-----+  Cortex thickness   0.35 mm Corex thickness           +------------------+-------+------------------+-----+  Kidney length (cm) 6.85    Kidney length (cm) 11.09  +------------------+-------+------------------+-----+  Summary: Renal:  Right: Abnormal size for the right kidney. No flow visualized within        the right kidney. Renal artery is not visualized suggesting        occlusion. Left:  Normal size of left kidney. Normal left Resistive Index.        Normal cortical thickness of the left kidney. No evidence of        left renal artery stenosis. LRV flow present.  *See table(s) above for measurements and observations.  Diagnosing physician: Hortencia Pilar MD  Electronically signed by Hortencia Pilar MD on 03/22/2021 at 4:31:05 PM.    Final      Assessment/Plan 1. Atherosclerosis of native artery of both lower extremities with intermittent claudication (HCC) Recommend:   The patient has evidence of atherosclerosis of the lower extremities with claudication.  The patient does not voice lifestyle limiting changes at this point in time.   Noninvasive studies do not suggest clinically significant change.   No invasive studies, angiography or surgery at this time The patient should continue walking and begin a more formal exercise program.  The patient should continue antiplatelet therapy and aggressive treatment of the lipid abnormalities   No changes in the patient's medications at this time - VAS US AORTA/IVC/ILIACS; Future - VAS [place] ABI WITH/WO TBI; Future  2. Bilateral carotid artery stenosis Given the patient's asymptomatic subcritical stenosis no further invasive testing or surgery at this time.   Duplex ultrasound shows 40-59%  stenosis RICA and the LICA,  no significant change compared to last year.   Continue antiplatelet therapy as prescribed Continue management of CAD, HTN and Hyperlipidemia Healthy heart diet,  encouraged exercise at least 4 times per week Follow up in 12 months with duplex ultrasound and physical exam  - VAS US CAROTID; Future  3. Benign essential HTN Continue antihypertensive medications as already ordered, these medications have been reviewed and there are no changes at this time.   4. Hyperlipidemia, unspecified hyperlipidemia type Continue statin as ordered and reviewed, no changes at this time     Hortencia Pilar, MD  04/16/2021 12:42 PM

## 2021-04-19 ENCOUNTER — Encounter (INDEPENDENT_AMBULATORY_CARE_PROVIDER_SITE_OTHER): Payer: Self-pay | Admitting: Vascular Surgery

## 2021-04-19 ENCOUNTER — Ambulatory Visit (INDEPENDENT_AMBULATORY_CARE_PROVIDER_SITE_OTHER): Payer: Medicare HMO | Admitting: Vascular Surgery

## 2021-04-19 ENCOUNTER — Ambulatory Visit (INDEPENDENT_AMBULATORY_CARE_PROVIDER_SITE_OTHER): Payer: Medicare HMO

## 2021-04-19 ENCOUNTER — Other Ambulatory Visit: Payer: Self-pay

## 2021-04-19 ENCOUNTER — Other Ambulatory Visit (INDEPENDENT_AMBULATORY_CARE_PROVIDER_SITE_OTHER): Payer: Self-pay | Admitting: Vascular Surgery

## 2021-04-19 VITALS — BP 173/78 | HR 82 | Resp 16 | Wt 185.2 lb

## 2021-04-19 DIAGNOSIS — I739 Peripheral vascular disease, unspecified: Secondary | ICD-10-CM

## 2021-04-19 DIAGNOSIS — E785 Hyperlipidemia, unspecified: Secondary | ICD-10-CM | POA: Diagnosis not present

## 2021-04-19 DIAGNOSIS — Z9889 Other specified postprocedural states: Secondary | ICD-10-CM | POA: Diagnosis not present

## 2021-04-19 DIAGNOSIS — I70213 Atherosclerosis of native arteries of extremities with intermittent claudication, bilateral legs: Secondary | ICD-10-CM | POA: Diagnosis not present

## 2021-04-19 DIAGNOSIS — I6523 Occlusion and stenosis of bilateral carotid arteries: Secondary | ICD-10-CM

## 2021-04-19 DIAGNOSIS — I10 Essential (primary) hypertension: Secondary | ICD-10-CM

## 2021-05-14 DIAGNOSIS — N1832 Chronic kidney disease, stage 3b: Secondary | ICD-10-CM | POA: Diagnosis not present

## 2021-05-14 DIAGNOSIS — R809 Proteinuria, unspecified: Secondary | ICD-10-CM | POA: Diagnosis not present

## 2021-05-14 DIAGNOSIS — I129 Hypertensive chronic kidney disease with stage 1 through stage 4 chronic kidney disease, or unspecified chronic kidney disease: Secondary | ICD-10-CM | POA: Diagnosis not present

## 2021-05-14 DIAGNOSIS — I509 Heart failure, unspecified: Secondary | ICD-10-CM | POA: Diagnosis not present

## 2021-05-14 DIAGNOSIS — N179 Acute kidney failure, unspecified: Secondary | ICD-10-CM | POA: Diagnosis not present

## 2021-05-14 DIAGNOSIS — R319 Hematuria, unspecified: Secondary | ICD-10-CM | POA: Diagnosis not present

## 2021-05-17 ENCOUNTER — Encounter: Payer: Medicare HMO | Attending: Internal Medicine

## 2021-05-17 ENCOUNTER — Other Ambulatory Visit: Payer: Self-pay

## 2021-05-17 DIAGNOSIS — Z955 Presence of coronary angioplasty implant and graft: Secondary | ICD-10-CM | POA: Insufficient documentation

## 2021-05-17 DIAGNOSIS — Z48812 Encounter for surgical aftercare following surgery on the circulatory system: Secondary | ICD-10-CM | POA: Insufficient documentation

## 2021-05-17 NOTE — Progress Notes (Signed)
Virtual Visit completed. Patient informed on EP and RD appointment and 6 Minute walk test. Patient also informed of patient health questionnaires on My Chart. Patient Verbalizes understanding. Visit diagnosis can be found in [facility] 04/16/2021. ?

## 2021-05-21 DIAGNOSIS — R0602 Shortness of breath: Secondary | ICD-10-CM | POA: Diagnosis not present

## 2021-05-21 DIAGNOSIS — I10 Essential (primary) hypertension: Secondary | ICD-10-CM | POA: Diagnosis not present

## 2021-05-21 DIAGNOSIS — Z95820 Peripheral vascular angioplasty status with implants and grafts: Secondary | ICD-10-CM | POA: Diagnosis not present

## 2021-05-21 DIAGNOSIS — I25118 Atherosclerotic heart disease of native coronary artery with other forms of angina pectoris: Secondary | ICD-10-CM | POA: Diagnosis not present

## 2021-05-21 DIAGNOSIS — I5023 Acute on chronic systolic (congestive) heart failure: Secondary | ICD-10-CM | POA: Diagnosis not present

## 2021-05-21 DIAGNOSIS — N1832 Chronic kidney disease, stage 3b: Secondary | ICD-10-CM | POA: Diagnosis not present

## 2021-05-21 DIAGNOSIS — N261 Atrophy of kidney (terminal): Secondary | ICD-10-CM | POA: Diagnosis not present

## 2021-05-21 DIAGNOSIS — J449 Chronic obstructive pulmonary disease, unspecified: Secondary | ICD-10-CM | POA: Diagnosis not present

## 2021-05-21 DIAGNOSIS — N280 Ischemia and infarction of kidney: Secondary | ICD-10-CM | POA: Diagnosis not present

## 2021-05-24 ENCOUNTER — Other Ambulatory Visit: Payer: Self-pay

## 2021-05-24 VITALS — Ht 73.0 in | Wt 184.5 lb

## 2021-05-24 DIAGNOSIS — Z48812 Encounter for surgical aftercare following surgery on the circulatory system: Secondary | ICD-10-CM | POA: Diagnosis not present

## 2021-05-24 DIAGNOSIS — Z955 Presence of coronary angioplasty implant and graft: Secondary | ICD-10-CM

## 2021-05-24 NOTE — Progress Notes (Signed)
Cardiac Individual Treatment Plan  Patient Details  Name: Gary Mccoy. MRN: [id number] Date of Birth: 02/10/1951 Referring Provider:   Flowsheet Row Cardiac Rehab from 05/24/2021 in [facility] Cardiac and Pulmonary Rehab  Referring Provider [name]       Initial Encounter Date:  Flowsheet Row Cardiac Rehab from 05/24/2021 in [facility] Cardiac and Pulmonary Rehab  Date 05/24/21       Visit Diagnosis: Status post coronary artery stent placement  Patient's Home Medications on Admission:  Current Outpatient Medications:    albuterol (VENTOLIN HFA) 108 (90 Base) MCG/ACT inhaler, Inhale 2 puffs into the lungs every 6 (six) hours as needed for wheezing or shortness of breath., Disp: 8 g, Rfl: 2   amLODipine (NORVASC) 10 MG tablet, Take 1 tablet (10 mg total) by mouth daily., Disp: 30 tablet, Rfl: 2   aspirin EC 81 MG tablet, Take 81 mg by mouth daily. (Patient not taking: Reported on 05/17/2021), Disp: , Rfl:    clopidogrel (PLAVIX) 75 MG tablet, Take 75 mg by mouth daily., Disp: , Rfl:    Coenzyme Q10 (COQ10) 200 MG CAPS, Take 200 mg by mouth daily., Disp: , Rfl:    furosemide (LASIX) 20 MG tablet, Take 2 tablets (40 mg total) by mouth daily as needed. for weight gain or swelling (Patient taking differently: Take 20 mg by mouth daily. Additional 20 ng if needed for weight gain or swelling), Disp: 30 tablet, Rfl: 2   hydrALAZINE (APRESOLINE) 50 MG tablet, Take 1 tablet (50 mg total) by mouth every 8 (eight) hours., Disp: 90 tablet, Rfl: 2   Iron-Vitamin C (VITRON-C) 65-125 MG TABS, Take 1 tablet by mouth daily., Disp: , Rfl:    isosorbide mononitrate (IMDUR) 60 MG 24 hr tablet, Take 1 tablet (60 mg total) by mouth daily., Disp: 30 tablet, Rfl: 2   metoprolol succinate (TOPROL-XL) 25 MG 24 hr tablet, Take 1 tablet (25 mg total) by mouth daily., Disp: 30 tablet, Rfl: 2   Multiple Vitamins-Minerals (MULTIVITAMIN ADULT PO), Take 1 tablet by mouth daily. Men, Disp: , Rfl:    nitroGLYCERIN (NITROSTAT)  0.4 MG SL tablet, Place under the tongue., Disp: , Rfl:    Omega-3 Fatty Acids (FISH OIL PO), Take by mouth., Disp: , Rfl:    pantoprazole (PROTONIX) 40 MG tablet, Take 40 mg by mouth daily., Disp: , Rfl:    Rivaroxaban (XARELTO) 15 MG TABS tablet, Take 1 tablet (15 mg total) by mouth daily., Disp: 30 tablet, Rfl: 2   rosuvastatin (CRESTOR) 40 MG tablet, Take 20 mg by mouth daily., Disp: , Rfl:    vitamin B-12 (CYANOCOBALAMIN) 1000 MCG tablet, Take 1,000 mcg by mouth daily., Disp: , Rfl:   Past Medical History: Past Medical History:  Diagnosis Date   Arthritis    lower back   Back pain    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    Complication of anesthesia    Violent with hallucinations after procedure 10/2015   DDD (degenerative disc disease), lumbar    Duodenal ulcer    Elevated lipids    GERD (gastroesophageal reflux disease)    Leg weakness, bilateral    s/p lumbar surgery   PAD (peripheral artery disease) ([place])    Polyradiculitis    Stroke ([place])    Rt lower leg  estimated 10 - 12 stokes in last 5 years   Wears dentures    full upper and lower    Tobacco Use: Social History   Tobacco Use  Smoking Status Every Day  Packs/day: 1.00   Years: 45.00   Pack years: 45.00   Types: Cigarettes  Smokeless Tobacco Never  Tobacco Comments   05/17/21- currently 0.5 PPD    Labs: Recent Review Flowsheet Data     Labs for ITP Cardiac and Pulmonary Rehab Latest Ref Rng & Units 09/19/2015 09/20/2015 02/26/2018 01/29/2021 01/31/2021   Cholestrol 0 - 200 mg/dL - 175 160 - 129   LDLCALC 0 - 99 mg/dL - 116(H) 102(H) - 73   HDL >40 mg/dL - 39(L) 35(L) - 38(L)   Trlycerides <150 mg/dL - 98 117 - 91   Hemoglobin A1c 4.8 - 5.6 % 6.1(H) - - 5.2 -        Exercise Target Goals: Exercise Program Goal: Individual exercise prescription set using results from initial 6 min walk test and THRR while considering  patients activity barriers and safety.   Exercise Prescription Goal: Initial exercise  prescription builds to 30-45 minutes a day of aerobic activity, 2-3 days per week.  Home exercise guidelines will be given to patient during program as part of exercise prescription that the participant will acknowledge.   Education: Aerobic Exercise: - Group verbal and visual presentation on the components of exercise prescription. Introduces F.I.T.T principle from ACSM for exercise prescriptions.  Reviews F.I.T.T. principles of aerobic exercise including progression. Written material given at graduation.   Education: Resistance Exercise: - Group verbal and visual presentation on the components of exercise prescription. Introduces F.I.T.T principle from ACSM for exercise prescriptions  Reviews F.I.T.T. principles of resistance exercise including progression. Written material given at graduation.    Education: Exercise & Equipment Safety: - Individual verbal instruction and demonstration of equipment use and safety with use of the equipment. Flowsheet Row Cardiac Rehab from 05/24/2021 in [facility] Cardiac and Pulmonary Rehab  Date 05/24/21  Educator AS  Instruction Review Code 1- Verbalizes Understanding       Education: Exercise Physiology & General Exercise Guidelines: - Group verbal and written instruction with models to review the exercise physiology of the cardiovascular system and associated critical values. Provides general exercise guidelines with specific guidelines to those with heart or lung disease.    Education: Flexibility, Balance, Mind/Body Relaxation: - Group verbal and visual presentation with interactive activity on the components of exercise prescription. Introduces F.I.T.T principle from ACSM for exercise prescriptions. Reviews F.I.T.T. principles of flexibility and balance exercise training including progression. Also discusses the mind body connection.  Reviews various relaxation techniques to help reduce and manage stress (i.e. Deep breathing, progressive muscle relaxation,  and visualization). Balance handout provided to take home. Written material given at graduation.   Activity Barriers & Risk Stratification:   6 Minute Walk:  6 Minute Walk     Row Name 05/24/21 1619         6 Minute Walk   Phase Initial     Distance 1110 feet     Walk Time 6 minutes     # of Rest Breaks 0     MPH 2.1     METS 3.5     RPE 13     Perceived Dyspnea  2     VO2 Peak 12.25     Symptoms Yes (comment)     Comments SOB     Resting HR 62 bpm     Resting BP 144/64     Resting Oxygen Saturation  95 %     Exercise Oxygen Saturation  during 6 min walk 97 %     Max Ex.  HR 107 bpm     Max Ex. BP 194/68     2 Minute Post BP 148/64              Oxygen Initial Assessment:   Oxygen Re-Evaluation:   Oxygen Discharge (Final Oxygen Re-Evaluation):   Initial Exercise Prescription:  Initial Exercise Prescription - 05/24/21 1600       Date of Initial Exercise RX and Referring Provider   Date 05/24/21    Referring Provider [name]      Oxygen   Maintain Oxygen Saturation 88% or higher      Treadmill   MPH 2.1    Grade 1.5    Minutes 15    METs 3      Recumbant Bike   Level 2    RPM 60    Watts 40    Minutes 15    METs 3.5      NuStep   Level 3    SPM 80    Minutes 15    METs 3.5      Track   Laps 30    Minutes 2.1    METs 2.6      Prescription Details   Frequency (times per week) 3    Duration Progress to 30 minutes of continuous aerobic without signs/symptoms of physical distress      Intensity   THRR 40-80% of Max Heartrate 97-133    Ratings of Perceived Exertion 11-13    Perceived Dyspnea 0-4      Resistance Training   Training Prescription Yes    Weight 4 lb    Reps 10-15             Perform Capillary Blood Glucose checks as needed.  Exercise Prescription Changes:   Exercise Prescription Changes     Row Name 05/24/21 1600             Response to Exercise   Blood Pressure (Admit) 144/64       Blood Pressure  (Exercise) 194/68       Blood Pressure (Exit) 148/64       Heart Rate (Admit) 62 bpm       Heart Rate (Exercise) 107 bpm       Heart Rate (Exit) 980 bpm       Oxygen Saturation (Admit) 95 %       Oxygen Saturation (Exercise) 97 %       Rating of Perceived Exertion (Exercise) 13       Perceived Dyspnea (Exercise) 2       Symptoms SOB                Exercise Comments:   Exercise Goals and Review:   Exercise Goals     Row Name 05/24/21 1625             Exercise Goals   Increase Physical Activity Yes       Intervention Provide advice, education, support and counseling about physical activity/exercise needs.;Develop an individualized exercise prescription for aerobic and resistive training based on initial evaluation findings, risk stratification, comorbidities and participant's personal goals.       Expected Outcomes Short Term: Attend rehab on a regular basis to increase amount of physical activity.;Long Term: Add in home exercise to make exercise part of routine and to increase amount of physical activity.;Long Term: Exercising regularly at least 3-5 days a week.       Increase Strength and Stamina Yes  Intervention Provide advice, education, support and counseling about physical activity/exercise needs.;Develop an individualized exercise prescription for aerobic and resistive training based on initial evaluation findings, risk stratification, comorbidities and participant's personal goals.       Expected Outcomes Short Term: Increase workloads from initial exercise prescription for resistance, speed, and METs.;Short Term: Perform resistance training exercises routinely during rehab and add in resistance training at home;Long Term: Improve cardiorespiratory fitness, muscular endurance and strength as measured by increased METs and functional capacity (6MWT)       Able to understand and use rate of perceived exertion (RPE) scale Yes       Intervention Provide education and  explanation on how to use RPE scale       Expected Outcomes Short Term: Able to use RPE daily in rehab to express subjective intensity level;Long Term:  Able to use RPE to guide intensity level when exercising independently       Able to understand and use Dyspnea scale Yes       Intervention Provide education and explanation on how to use Dyspnea scale       Expected Outcomes Short Term: Able to use Dyspnea scale daily in rehab to express subjective sense of shortness of breath during exertion;Long Term: Able to use Dyspnea scale to guide intensity level when exercising independently       Knowledge and understanding of Target Heart Rate Range (THRR) Yes       Intervention Provide education and explanation of THRR including how the numbers were predicted and where they are located for reference       Expected Outcomes Short Term: Able to state/look up THRR;Short Term: Able to use daily as guideline for intensity in rehab;Long Term: Able to use THRR to govern intensity when exercising independently       Able to check pulse independently Yes       Intervention Provide education and demonstration on how to check pulse in carotid and radial arteries.;Review the importance of being able to check your own pulse for safety during independent exercise       Expected Outcomes Short Term: Able to explain why pulse checking is important during independent exercise;Long Term: Able to check pulse independently and accurately       Understanding of Exercise Prescription Yes       Intervention Provide education, explanation, and written materials on patient's individual exercise prescription       Expected Outcomes Short Term: Able to explain program exercise prescription;Long Term: Able to explain home exercise prescription to exercise independently                Exercise Goals Re-Evaluation :   Discharge Exercise Prescription (Final Exercise Prescription Changes):  Exercise Prescription Changes -  05/24/21 1600       Response to Exercise   Blood Pressure (Admit) 144/64    Blood Pressure (Exercise) 194/68    Blood Pressure (Exit) 148/64    Heart Rate (Admit) 62 bpm    Heart Rate (Exercise) 107 bpm    Heart Rate (Exit) 980 bpm    Oxygen Saturation (Admit) 95 %    Oxygen Saturation (Exercise) 97 %    Rating of Perceived Exertion (Exercise) 13    Perceived Dyspnea (Exercise) 2    Symptoms SOB             Nutrition:  Target Goals: Understanding of nutrition guidelines, daily intake of sodium <1561m, cholesterol <2026m calories 30% from fat and 7% or less  from saturated fats, daily to have 5 or more servings of fruits and vegetables.  Education: All About Nutrition: -Group instruction provided by verbal, written material, interactive activities, discussions, models, and posters to present general guidelines for heart healthy nutrition including fat, fiber, MyPlate, the role of sodium in heart healthy nutrition, utilization of the nutrition label, and utilization of this knowledge for meal planning. Follow up email sent as well. Written material given at graduation. Flowsheet Row Cardiac Rehab from 05/24/2021 in [facility] Cardiac and Pulmonary Rehab  Education need identified 05/24/21       Biometrics:  Pre Biometrics - 05/24/21 1625       Pre Biometrics   Height _0  (1.854 m)    Weight 184 lb 8 oz (83.7 kg)    BMI (Calculated) 24.35    Single Leg Stand 13.79 seconds              Nutrition Therapy Plan and Nutrition Goals:   Nutrition Assessments:  MEDIFICTS Score Key: ?70 Need to make dietary changes  40-70 Heart Healthy Diet ? 40 Therapeutic Level Cholesterol Diet  Flowsheet Row Cardiac Rehab from 05/24/2021 in [facility] Cardiac and Pulmonary Rehab  Picture Your Plate Total Score on Admission 69      Picture Your Plate Scores: <01 Unhealthy dietary pattern with much room for improvement. 41-50 Dietary pattern unlikely to meet recommendations for good health  and room for improvement. 51-60 More healthful dietary pattern, with some room for improvement.  >60 Healthy dietary pattern, although there may be some specific behaviors that could be improved.    Nutrition Goals Re-Evaluation:   Nutrition Goals Discharge (Final Nutrition Goals Re-Evaluation):   Psychosocial: Target Goals: Acknowledge presence or absence of significant depression and/or stress, maximize coping skills, provide positive support system. Participant is able to verbalize types and ability to use techniques and skills needed for reducing stress and depression.   Education: Stress, Anxiety, and Depression - Group verbal and visual presentation to define topics covered.  Reviews how body is impacted by stress, anxiety, and depression.  Also discusses healthy ways to reduce stress and to treat/manage anxiety and depression.  Written material given at graduation.   Education: Sleep Hygiene -Provides group verbal and written instruction about how sleep can affect your health.  Define sleep hygiene, discuss sleep cycles and impact of sleep habits. Review good sleep hygiene tips.    Initial Review & Psychosocial Screening:  Initial Psych Review & Screening - 05/17/21 0903       Initial Review   Current issues with None Identified      Family Dynamics   Good Support System? Yes    Comments He can look to his wife and two sons for support. He has a positive outlook on his health. He does not show any signs of depression or anxiety.      Barriers   Psychosocial barriers to participate in program The patient should benefit from training in stress management and relaxation.;There are no identifiable barriers or psychosocial needs.      Screening Interventions   Interventions Encouraged to exercise;To provide support and resources with identified psychosocial needs;Provide feedback about the scores to participant    Expected Outcomes Short Term goal: Utilizing psychosocial  counselor, staff and physician to assist with identification of specific Stressors or current issues interfering with healing process. Setting desired goal for each stressor or current issue identified.;Long Term Goal: Stressors or current issues are controlled or eliminated.;Short Term goal: Identification and review with  participant of any Quality of Life or Depression concerns found by scoring the questionnaire.;Long Term goal: The participant improves quality of Life and PHQ9 Scores as seen by post scores and/or verbalization of changes             Quality of Life Scores:   Quality of Life - 05/24/21 1629       Quality of Life   Select Quality of Life      Quality of Life Scores   Health/Function Pre 6.37 %    Socioeconomic Pre 19.07 %    Psych/Spiritual Pre 13.93 %    Family Pre 24.6 %    GLOBAL Pre 13.22 %            Scores of 19 and below usually indicate a poorer quality of life in these areas.  A difference of  2-3 points is a clinically meaningful difference.  A difference of 2-3 points in the total score of the Quality of Life Index has been associated with significant improvement in overall quality of life, self-image, physical symptoms, and general health in studies assessing change in quality of life.  PHQ-9: Recent Review Flowsheet Data     Depression screen [facility] 2/9 05/24/2021   Decreased Interest 1   Down, Depressed, Hopeless 0   PHQ - 2 Score 1   Altered sleeping 1   Tired, decreased energy 1   Change in appetite 0   Feeling bad or failure about yourself  0   Trouble concentrating 0   Moving slowly or fidgety/restless 0   Suicidal thoughts 0   PHQ-9 Score 3   Difficult doing work/chores Somewhat difficult      Interpretation of Total Score  Total Score Depression Severity:  1-4 = Minimal depression, 5-9 = Mild depression, 10-14 = Moderate depression, 15-19 = Moderately severe depression, 20-27 = Severe depression   Psychosocial Evaluation and  Intervention:  Psychosocial Evaluation - 05/17/21 0904       Psychosocial Evaluation & Interventions   Interventions Encouraged to exercise with the program and follow exercise prescription;Relaxation education;Stress management education    Comments He can look to his wife and two sons for support. He has a positive outlook on his health. He does not show any signs of depression or anxiety.    Expected Outcomes Short: Start HeartTrack to help with mood. Long: Maintain a healthy mental state    Continue Psychosocial Services  Follow up required by staff             Psychosocial Re-Evaluation:   Psychosocial Discharge (Final Psychosocial Re-Evaluation):   Vocational Rehabilitation: Provide vocational rehab assistance to qualifying candidates.   Vocational Rehab Evaluation & Intervention:   Education: Education Goals: Education classes will be provided on a variety of topics geared toward better understanding of heart health and risk factor modification. Participant will state understanding/return demonstration of topics presented as noted by education test scores.  Learning Barriers/Preferences:  Learning Barriers/Preferences - 05/17/21 0901       Learning Barriers/Preferences   Learning Barriers None    Learning Preferences None             General Cardiac Education Topics:  AED/CPR: - Group verbal and written instruction with the use of models to demonstrate the basic use of the AED with the basic ABC's of resuscitation.   Anatomy and Cardiac Procedures: - Group verbal and visual presentation and models provide information about basic cardiac anatomy and function. Reviews the testing methods done to  diagnose heart disease and the outcomes of the test results. Describes the treatment choices: Medical Management, Angioplasty, or Coronary Bypass Surgery for treating various heart conditions including Myocardial Infarction, Angina, Valve Disease, and Cardiac  Arrhythmias.  Written material given at graduation. Flowsheet Row Cardiac Rehab from 05/24/2021 in [facility] Cardiac and Pulmonary Rehab  Education need identified 05/24/21       Medication Safety: - Group verbal and visual instruction to review commonly prescribed medications for heart and lung disease. Reviews the medication, class of the drug, and side effects. Includes the steps to properly store meds and maintain the prescription regimen.  Written material given at graduation.   Intimacy: - Group verbal instruction through game format to discuss how heart and lung disease can affect sexual intimacy. Written material given at graduation..   Know Your Numbers and Heart Failure: - Group verbal and visual instruction to discuss disease risk factors for cardiac and pulmonary disease and treatment options.  Reviews associated critical values for Overweight/Obesity, Hypertension, Cholesterol, and Diabetes.  Discusses basics of heart failure: signs/symptoms and treatments.  Introduces Heart Failure Zone chart for action plan for heart failure.  Written material given at graduation.   Infection Prevention: - Provides verbal and written material to individual with discussion of infection control including proper hand washing and proper equipment cleaning during exercise session. Flowsheet Row Cardiac Rehab from 05/24/2021 in [facility] Cardiac and Pulmonary Rehab  Date 05/24/21  Educator AS  Instruction Review Code 1- Verbalizes Understanding       Falls Prevention: - Provides verbal and written material to individual with discussion of falls prevention and safety. Flowsheet Row Cardiac Rehab from 05/24/2021 in [facility] Cardiac and Pulmonary Rehab  Date 05/24/21  Educator AS  Instruction Review Code 1- Verbalizes Understanding       Other: -Provides group and verbal instruction on various topics (see comments)   Knowledge Questionnaire Score:  Knowledge Questionnaire Score - 05/24/21 1626        Knowledge Questionnaire Score   Pre Score 23/26             Core Components/Risk Factors/Patient Goals at Admission:  Personal Goals and Risk Factors at Admission - 05/24/21 1630       Core Components/Risk Factors/Patient Goals on Admission    Weight Management Yes;Weight Maintenance    Intervention Weight Management: Develop a combined nutrition and exercise program designed to reach desired caloric intake, while maintaining appropriate intake of nutrient and fiber, sodium and fats, and appropriate energy expenditure required for the weight goal.;Weight Management: Provide education and appropriate resources to help participant work on and attain dietary goals.;Weight Management/Obesity: Establish reasonable short term and long term weight goals.    Expected Outcomes Short Term: Continue to assess and modify interventions until short term weight is achieved;Long Term: Adherence to nutrition and physical activity/exercise program aimed toward attainment of established weight goal;Understanding recommendations for meals to include 15-35% energy as protein, 25-35% energy from fat, 35-60% energy from carbohydrates, less than 256m of dietary cholesterol, 20-35 gm of total fiber daily;Understanding of distribution of calorie intake throughout the day with the consumption of 4-5 meals/snacks;Weight Maintenance: Understanding of the daily nutrition guidelines, which includes 25-35% calories from fat, 7% or less cal from saturated fats, less than 2032mcholesterol, less than 1.5gm of sodium, & 5 or more servings of fruits and vegetables daily    Heart Failure Yes    Intervention Provide a combined exercise and nutrition program that is supplemented with education, support and counseling  about heart failure. Directed toward relieving symptoms such as shortness of breath, decreased exercise tolerance, and extremity edema.    Expected Outcomes Improve functional capacity of life;Short term: Attendance in  program 2-3 days a week with increased exercise capacity. Reported lower sodium intake. Reported increased fruit and vegetable intake. Reports medication compliance.;Short term: Daily weights obtained and reported for increase. Utilizing diuretic protocols set by physician.;Long term: Adoption of self-care skills and reduction of barriers for early signs and symptoms recognition and intervention leading to self-care maintenance.    Hypertension Yes    Intervention Provide education on lifestyle modifcations including regular physical activity/exercise, weight management, moderate sodium restriction and increased consumption of fresh fruit, vegetables, and low fat dairy, alcohol moderation, and smoking cessation.;Monitor prescription use compliance.    Expected Outcomes Short Term: Continued assessment and intervention until BP is < 140/23m HG in hypertensive participants. < 130/831mHG in hypertensive participants with diabetes, heart failure or chronic kidney disease.;Long Term: Maintenance of blood pressure at goal levels.    Lipids Yes    Intervention Provide education and support for participant on nutrition & aerobic/resistive exercise along with prescribed medications to achieve LDL <7056mHDL >14m34m  Expected Outcomes Short Term: Participant states understanding of desired cholesterol values and is compliant with medications prescribed. Participant is following exercise prescription and nutrition guidelines.;Long Term: Cholesterol controlled with medications as prescribed, with individualized exercise RX and with personalized nutrition plan. Value goals: LDL < 70mg51mL > 40 mg.             Education:Diabetes - Individual verbal and written instruction to review signs/symptoms of diabetes, desired ranges of glucose level fasting, after meals and with exercise. Acknowledge that pre and post exercise glucose checks will be done for 3 sessions at entry of program.   Core Components/Risk  Factors/Patient Goals Review:    Core Components/Risk Factors/Patient Goals at Discharge (Final Review):    ITP Comments:  ITP Comments     Row Name 05/17/21 0900           ITP Comments Virtual Visit completed. Patient informed on EP and RD appointment and 6 Minute walk test. Patient also informed of patient health questionnaires on My Chart. Patient Verbalizes understanding. Visit diagnosis can be found in [facility]/2023.                Comments: initial ITP

## 2021-05-24 NOTE — Patient Instructions (Signed)
Patient Instructions  Patient Details  Name: Gary Mccoy. MRN: [id number] Date of Birth: 05/04/1950 Referring Provider:  Rusty Aus, MD  Below are your personal goals for exercise, nutrition, and risk factors. Our goal is to help you stay on track towards obtaining and maintaining these goals. We will be discussing your progress on these goals with you throughout the program.  Initial Exercise Prescription:  Initial Exercise Prescription - 05/24/21 1600       Date of Initial Exercise RX and Referring Provider   Date 05/24/21    Referring Provider [name]      Oxygen   Maintain Oxygen Saturation 88% or higher      Treadmill   MPH 2.1    Grade 1.5    Minutes 15    METs 3      Recumbant Bike   Level 2    RPM 60    Watts 40    Minutes 15    METs 3.5      NuStep   Level 3    SPM 80    Minutes 15    METs 3.5      Track   Laps 30    Minutes 2.1    METs 2.6      Prescription Details   Frequency (times per week) 3    Duration Progress to 30 minutes of continuous aerobic without signs/symptoms of physical distress      Intensity   THRR 40-80% of Max Heartrate 97-133    Ratings of Perceived Exertion 11-13    Perceived Dyspnea 0-4      Resistance Training   Training Prescription Yes    Weight 4 lb    Reps 10-15             Exercise Goals: Frequency: Be able to perform aerobic exercise two to three times per week in program working toward 2-5 days per week of home exercise.  Intensity: Work with a perceived exertion of 11 (fairly light) - 15 (hard) while following your exercise prescription.  We will make changes to your prescription with you as you progress through the program.   Duration: Be able to do 30 to 45 minutes of continuous aerobic exercise in addition to a 5 minute warm-up and a 5 minute cool-down routine.   Nutrition Goals: Your personal nutrition goals will be established when you do your nutrition analysis with the dietician.  The  following are general nutrition guidelines to follow: Cholesterol < 219m/day Sodium < 15031mday Fiber: Men over 50 yrs - 30 grams per day  Personal Goals:  Personal Goals and Risk Factors at Admission - 05/24/21 1630       Core Components/Risk Factors/Patient Goals on Admission    Weight Management Yes;Weight Maintenance    Intervention Weight Management: Develop a combined nutrition and exercise program designed to reach desired caloric intake, while maintaining appropriate intake of nutrient and fiber, sodium and fats, and appropriate energy expenditure required for the weight goal.;Weight Management: Provide education and appropriate resources to help participant work on and attain dietary goals.;Weight Management/Obesity: Establish reasonable short term and long term weight goals.    Expected Outcomes Short Term: Continue to assess and modify interventions until short term weight is achieved;Long Term: Adherence to nutrition and physical activity/exercise program aimed toward attainment of established weight goal;Understanding recommendations for meals to include 15-35% energy as protein, 25-35% energy from fat, 35-60% energy from carbohydrates, less than 20067mf dietary cholesterol, 20-35 gm of  total fiber daily;Understanding of distribution of calorie intake throughout the day with the consumption of 4-5 meals/snacks;Weight Maintenance: Understanding of the daily nutrition guidelines, which includes 25-35% calories from fat, 7% or less cal from saturated fats, less than 242m cholesterol, less than 1.5gm of sodium, & 5 or more servings of fruits and vegetables daily    Heart Failure Yes    Intervention Provide a combined exercise and nutrition program that is supplemented with education, support and counseling about heart failure. Directed toward relieving symptoms such as shortness of breath, decreased exercise tolerance, and extremity edema.    Expected Outcomes Improve functional capacity  of life;Short term: Attendance in program 2-3 days a week with increased exercise capacity. Reported lower sodium intake. Reported increased fruit and vegetable intake. Reports medication compliance.;Short term: Daily weights obtained and reported for increase. Utilizing diuretic protocols set by physician.;Long term: Adoption of self-care skills and reduction of barriers for early signs and symptoms recognition and intervention leading to self-care maintenance.    Hypertension Yes    Intervention Provide education on lifestyle modifcations including regular physical activity/exercise, weight management, moderate sodium restriction and increased consumption of fresh fruit, vegetables, and low fat dairy, alcohol moderation, and smoking cessation.;Monitor prescription use compliance.    Expected Outcomes Short Term: Continued assessment and intervention until BP is < 140/924mHG in hypertensive participants. < 130/8063mG in hypertensive participants with diabetes, heart failure or chronic kidney disease.;Long Term: Maintenance of blood pressure at goal levels.    Lipids Yes    Intervention Provide education and support for participant on nutrition & aerobic/resistive exercise along with prescribed medications to achieve LDL <68m64mDL >40mg43m Expected Outcomes Short Term: Participant states understanding of desired cholesterol values and is compliant with medications prescribed. Participant is following exercise prescription and nutrition guidelines.;Long Term: Cholesterol controlled with medications as prescribed, with individualized exercise RX and with personalized nutrition plan. Value goals: LDL < 68mg,58m > 40 mg.             Tobacco Use Initial Evaluation: Social History   Tobacco Use  Smoking Status Every Day   Packs/day: 1.00   Years: 45.00   Pack years: 45.00   Types: Cigarettes  Smokeless Tobacco Never  Tobacco Comments   05/17/21- currently 0.5 PPD    Exercise Goals and  Review:  Exercise Goals     Row Name 05/24/21 1625             Exercise Goals   Increase Physical Activity Yes       Intervention Provide advice, education, support and counseling about physical activity/exercise needs.;Develop an individualized exercise prescription for aerobic and resistive training based on initial evaluation findings, risk stratification, comorbidities and participant's personal goals.       Expected Outcomes Short Term: Attend rehab on a regular basis to increase amount of physical activity.;Long Term: Add in home exercise to make exercise part of routine and to increase amount of physical activity.;Long Term: Exercising regularly at least 3-5 days a week.       Increase Strength and Stamina Yes       Intervention Provide advice, education, support and counseling about physical activity/exercise needs.;Develop an individualized exercise prescription for aerobic and resistive training based on initial evaluation findings, risk stratification, comorbidities and participant's personal goals.       Expected Outcomes Short Term: Increase workloads from initial exercise prescription for resistance, speed, and METs.;Short Term: Perform resistance training exercises routinely during rehab and add  in resistance training at home;Long Term: Improve cardiorespiratory fitness, muscular endurance and strength as measured by increased METs and functional capacity (6MWT)       Able to understand and use rate of perceived exertion (RPE) scale Yes       Intervention Provide education and explanation on how to use RPE scale       Expected Outcomes Short Term: Able to use RPE daily in rehab to express subjective intensity level;Long Term:  Able to use RPE to guide intensity level when exercising independently       Able to understand and use Dyspnea scale Yes       Intervention Provide education and explanation on how to use Dyspnea scale       Expected Outcomes Short Term: Able to use Dyspnea  scale daily in rehab to express subjective sense of shortness of breath during exertion;Long Term: Able to use Dyspnea scale to guide intensity level when exercising independently       Knowledge and understanding of Target Heart Rate Range (THRR) Yes       Intervention Provide education and explanation of THRR including how the numbers were predicted and where they are located for reference       Expected Outcomes Short Term: Able to state/look up THRR;Short Term: Able to use daily as guideline for intensity in rehab;Long Term: Able to use THRR to govern intensity when exercising independently       Able to check pulse independently Yes       Intervention Provide education and demonstration on how to check pulse in carotid and radial arteries.;Review the importance of being able to check your own pulse for safety during independent exercise       Expected Outcomes Short Term: Able to explain why pulse checking is important during independent exercise;Long Term: Able to check pulse independently and accurately       Understanding of Exercise Prescription Yes       Intervention Provide education, explanation, and written materials on patient's individual exercise prescription       Expected Outcomes Short Term: Able to explain program exercise prescription;Long Term: Able to explain home exercise prescription to exercise independently                Copy of goals given to participant.

## 2021-05-28 ENCOUNTER — Encounter: Payer: Medicare HMO | Admitting: *Deleted

## 2021-05-28 ENCOUNTER — Other Ambulatory Visit: Payer: Self-pay

## 2021-05-28 DIAGNOSIS — Z955 Presence of coronary angioplasty implant and graft: Secondary | ICD-10-CM | POA: Diagnosis not present

## 2021-05-28 DIAGNOSIS — Z48812 Encounter for surgical aftercare following surgery on the circulatory system: Secondary | ICD-10-CM | POA: Diagnosis not present

## 2021-05-28 NOTE — Progress Notes (Signed)
Daily Session Note ? ?Patient Details  ?Name: Lucile A Phil Dopp. ?MRN: [id number] ?Date of Birth: 03/03/1951 ?Referring Provider:   ?Flowsheet Row Cardiac Rehab from 05/24/2021 in [facility] Cardiac and Pulmonary Rehab  ?Referring Provider [name]  ? ?  ? ? ?Encounter Date: 05/28/2021 ? ?Check In: ? Session Check In - 05/28/21 0924   ? ?  ? Check-In  ? Supervising physician immediately available to respond to emergencies See telemetry face sheet for immediately available ER MD   ? Location [facility]-Cardiac & Pulmonary Rehab   ? Staff Present Heath Lark, RN, BSN, CCRP;Joseph [place], RCP,RRT,BSRT;Kelly [place], [place], ACSM CEP, Exercise Physiologist   ? Virtual Visit No   ? Medication changes reported     No   ? Fall or balance concerns reported    No   ? Warm-up and Cool-down Performed on first and last piece of equipment   ? Resistance Training Performed Yes   ? VAD Patient? No   ? PAD/SET Patient? No   ?  ? Pain Assessment  ? Currently in Pain? No/denies   ? ?  ?  ? ?  ? ? ? ? ? ?Social History  ? ?Tobacco Use  ?Smoking Status Every Day  ? Packs/day: 1.00  ? Years: 45.00  ? Pack years: 45.00  ? Types: Cigarettes  ?Smokeless Tobacco Never  ?Tobacco Comments  ? 05/17/21- currently 0.5 PPD  ? ? ?Goals Met:  ?Exercise tolerated well ?Personal goals reviewed ?No report of concerns or symptoms today ? ?Goals Unmet:  ?Not Applicable ? ?Comments: First full day of exercise!  Patient was oriented to gym and equipment including functions, settings, policies, and procedures.  Patient's individual exercise prescription and treatment plan were reviewed.  All starting workloads were established based on the results of the 6 minute walk test done at initial orientation visit.  The plan for exercise progression was also introduced and progression will be customized based on patient's performance and goals. ? ? ? ?Dr. Emily Filbert is Medical Director for [place].  ?Dr. Ottie Glazier is Medical Director for [facility] Pulmonary  Rehabilitation. ?

## 2021-05-30 ENCOUNTER — Other Ambulatory Visit: Payer: Self-pay

## 2021-05-30 DIAGNOSIS — Z48812 Encounter for surgical aftercare following surgery on the circulatory system: Secondary | ICD-10-CM | POA: Diagnosis not present

## 2021-05-30 DIAGNOSIS — Z955 Presence of coronary angioplasty implant and graft: Secondary | ICD-10-CM | POA: Diagnosis not present

## 2021-05-30 NOTE — Progress Notes (Signed)
Daily Session Note ? ?Patient Details  ?Name: Gary Mccoy. ?MRN: [id number] ?Date of Birth: [birth year] ?Referring Provider:   ?Flowsheet Row Cardiac Rehab from 05/24/2021 in [facility] Cardiac and Pulmonary Rehab  ?Referring Provider [name]  ? ?  ? ? ?Encounter Date: 05/30/2021 ? ?Check In: ? Session Check In - 05/30/21 0726   ? ?  ? Check-In  ? Supervising physician immediately available to respond to emergencies See telemetry face sheet for immediately available ER MD   ? Location [facility]-Cardiac & Pulmonary Rehab   ? Staff Present Birdie Sons, MPA, RN;Jessica Luan Pulling, MA, RCEP, CCRP, CCET;Joseph [place], [place]   ? Virtual Visit No   ? Medication changes reported     No   ? Fall or balance concerns reported    No   ? Tobacco Cessation No Change   ? Warm-up and Cool-down Performed on first and last piece of equipment   ? Resistance Training Performed Yes   ? VAD Patient? No   ? PAD/SET Patient? No   ?  ? Pain Assessment  ? Currently in Pain? No/denies   ? ?  ?  ? ?  ? ? ? ? ? ?Social History  ? ?Tobacco Use  ?Smoking Status Every Day  ? Packs/day: 1.00  ? Years: 45.00  ? Pack years: 45.00  ? Types: Cigarettes  ?Smokeless Tobacco Never  ?Tobacco Comments  ? 05/17/21- currently 0.5 PPD  ? ? ?Goals Met:  ?Independence with exercise equipment ?Exercise tolerated well ?No report of concerns or symptoms today ?Strength training completed today ? ?Goals Unmet:  ?Not Applicable ? ?Comments: Pt able to follow exercise prescription today without complaint.  Will continue to monitor for progression. ? ? ? ?Dr. Emily Filbert is Medical Director for [place].  ?Dr. Ottie Glazier is Medical Director for [facility] Pulmonary Rehabilitation. ?

## 2021-06-01 ENCOUNTER — Encounter: Payer: Medicare HMO | Admitting: *Deleted

## 2021-06-01 ENCOUNTER — Other Ambulatory Visit: Payer: Self-pay

## 2021-06-01 DIAGNOSIS — Z955 Presence of coronary angioplasty implant and graft: Secondary | ICD-10-CM | POA: Diagnosis not present

## 2021-06-01 DIAGNOSIS — Z48812 Encounter for surgical aftercare following surgery on the circulatory system: Secondary | ICD-10-CM | POA: Diagnosis not present

## 2021-06-01 NOTE — Progress Notes (Signed)
Daily Session Note ? ?Patient Details  ?Name: Gary Mccoy. ?MRN: [id number] ?Date of Birth: [birth year] ?Referring Provider:   ?Flowsheet Row Cardiac Rehab from 05/24/2021 in [facility] Cardiac and Pulmonary Rehab  ?Referring Provider [name]  ? ?  ? ? ?Encounter Date: 06/01/2021 ? ?Check In: ? Session Check In - 06/01/21 0808   ? ?  ? Check-In  ? Supervising physician immediately available to respond to emergencies See telemetry face sheet for immediately available ER MD   ? Location [facility]-Cardiac & Pulmonary Rehab   ? Staff Present Heath Lark, RN, BSN, CCRP;Joseph [place], [place], [place], [place], [place], CCET   ? Virtual Visit No   ? Medication changes reported     No   ? Fall or balance concerns reported    No   ? Tobacco Cessation No Change   ? Current number of cigarettes/nicotine per day     10   ? Warm-up and Cool-down Performed on first and last piece of equipment   ? Resistance Training Performed Yes   ? VAD Patient? No   ? PAD/SET Patient? No   ?  ? Pain Assessment  ? Currently in Pain? No/denies   ? ?  ?  ? ?  ? ? ? ? ? ?Social History  ? ?Tobacco Use  ?Smoking Status Every Day  ? Packs/day: 1.00  ? Years: 45.00  ? Pack years: 45.00  ? Types: Cigarettes  ?Smokeless Tobacco Never  ?Tobacco Comments  ? 05/17/21- currently 0.5 PPD  ? ? ?Goals Met:  ?Independence with exercise equipment ?Exercise tolerated well ?No report of concerns or symptoms today ? ?Goals Unmet:  ?Not Applicable ? ?Comments: Pt able to follow exercise prescription today without complaint.  Will continue to monitor for progression. ? ? ? ?Dr. Emily Filbert is Medical Director for [place].  ?Dr. Ottie Glazier is Medical Director for [facility] Pulmonary Rehabilitation. ?

## 2021-06-04 ENCOUNTER — Encounter: Payer: Medicare HMO | Admitting: *Deleted

## 2021-06-04 ENCOUNTER — Other Ambulatory Visit: Payer: Self-pay

## 2021-06-04 DIAGNOSIS — Z955 Presence of coronary angioplasty implant and graft: Secondary | ICD-10-CM

## 2021-06-04 DIAGNOSIS — Z48812 Encounter for surgical aftercare following surgery on the circulatory system: Secondary | ICD-10-CM | POA: Diagnosis not present

## 2021-06-04 NOTE — Progress Notes (Signed)
Daily Session Note ? ?Patient Details  ?Name: Gary Mccoy. ?MRN: [id number] ?Date of Birth: 1950-08-21 ?Referring Provider:   ?Flowsheet Row Cardiac Rehab from 05/24/2021 in [facility] Cardiac and Pulmonary Rehab  ?Referring Provider [name]  ? ?  ? ? ?Encounter Date: 06/04/2021 ? ?Check In: ? Session Check In - 06/04/21 0755   ? ?  ? Check-In  ? Supervising physician immediately available to respond to emergencies See telemetry face sheet for immediately available ER MD   ? Location [facility]-Cardiac & Pulmonary Rehab   ? Staff Present Heath Lark, RN, BSN, CCRP;Joseph [place], RCP,RRT,BSRT;Kelly [place], [place], ACSM CEP, Exercise Physiologist   ? Virtual Visit No   ? Medication changes reported     No   ? Fall or balance concerns reported    No   ? Current number of cigarettes/nicotine per day     10   ? Warm-up and Cool-down Performed on first and last piece of equipment   ? Resistance Training Performed Yes   ? VAD Patient? No   ? PAD/SET Patient? No   ?  ? Pain Assessment  ? Currently in Pain? No/denies   ? ?  ?  ? ?  ? ? ? ? ? ?Social History  ? ?Tobacco Use  ?Smoking Status Every Day  ? Packs/day: 1.00  ? Years: 45.00  ? Pack years: 45.00  ? Types: Cigarettes  ?Smokeless Tobacco Never  ?Tobacco Comments  ? 05/17/21- currently 0.5 PPD  ? ? ?Goals Met:  ?Independence with exercise equipment ?Exercise tolerated well ?No report of concerns or symptoms today ? ?Goals Unmet:  ?Not Applicable ? ?Comments: Pt able to follow exercise prescription today without complaint.  Will continue to monitor for progression. ? ? ? ?Dr. Emily Filbert is Medical Director for [place].  ?Dr. Ottie Glazier is Medical Director for [facility] Pulmonary Rehabilitation. ?

## 2021-06-04 NOTE — Progress Notes (Signed)
Completed initial RD consultation ?

## 2021-06-06 ENCOUNTER — Other Ambulatory Visit: Payer: Self-pay

## 2021-06-06 ENCOUNTER — Encounter: Payer: Self-pay | Admitting: *Deleted

## 2021-06-06 DIAGNOSIS — Z955 Presence of coronary angioplasty implant and graft: Secondary | ICD-10-CM | POA: Diagnosis not present

## 2021-06-06 DIAGNOSIS — Z48812 Encounter for surgical aftercare following surgery on the circulatory system: Secondary | ICD-10-CM | POA: Diagnosis not present

## 2021-06-06 DIAGNOSIS — I420 Dilated cardiomyopathy: Secondary | ICD-10-CM | POA: Diagnosis not present

## 2021-06-06 DIAGNOSIS — N184 Chronic kidney disease, stage 4 (severe): Secondary | ICD-10-CM | POA: Diagnosis not present

## 2021-06-06 DIAGNOSIS — I5023 Acute on chronic systolic (congestive) heart failure: Secondary | ICD-10-CM | POA: Diagnosis not present

## 2021-06-06 NOTE — Progress Notes (Signed)
Daily Session Note ? ?Patient Details  ?Name: Gary Mccoy. ?MRN: [id number] ?Date of Birth: [birth year] ?Referring Provider:   ?Flowsheet Row Cardiac Rehab from 05/24/2021 in [facility] Cardiac and Pulmonary Rehab  ?Referring Provider [name]  ? ?  ? ? ?Encounter Date: 06/06/2021 ? ?Check In: ? Session Check In - 06/06/21 0729   ? ?  ? Check-In  ? Supervising physician immediately available to respond to emergencies See telemetry face sheet for immediately available ER MD   ? Location [facility]-Cardiac & Pulmonary Rehab   ? Staff Present Birdie Sons, MPA, RN;Joseph [place], [place], MA, RCEP, CCRP, CCET;Melissa [place], [place], LDN   ? Virtual Visit No   ? Medication changes reported     No   ? Fall or balance concerns reported    No   ? Tobacco Cessation No Change   ? Warm-up and Cool-down Performed on first and last piece of equipment   ? Resistance Training Performed Yes   ? VAD Patient? No   ? PAD/SET Patient? No   ?  ? Pain Assessment  ? Currently in Pain? No/denies   ? ?  ?  ? ?  ? ? ? ? ? ?Social History  ? ?Tobacco Use  ?Smoking Status Every Day  ? Packs/day: 1.00  ? Years: 45.00  ? Pack years: 45.00  ? Types: Cigarettes  ?Smokeless Tobacco Never  ?Tobacco Comments  ? 05/17/21- currently 0.5 PPD  ? ? ?Goals Met:  ?Independence with exercise equipment ?Exercise tolerated well ?No report of concerns or symptoms today ?Strength training completed today ? ?Goals Unmet:  ?Not Applicable ? ?Comments: Pt able to follow exercise prescription today without complaint.  Will continue to monitor for progression. ? ? ? ?Dr. Emily Filbert is Medical Director for [place].  ?Dr. Ottie Glazier is Medical Director for [facility] Pulmonary Rehabilitation. ?

## 2021-06-06 NOTE — Progress Notes (Signed)
Cardiac Individual Treatment Plan ? ?Patient Details  ?Name: Gary Mccoy. ?MRN: [id number] ?Date of Birth: Feb 14, 1951 ?Referring Provider:   ?Flowsheet Row Cardiac Rehab from 05/24/2021 in [facility] Cardiac and Pulmonary Rehab  ?Referring Provider [name]  ? ?  ? ? ?Initial Encounter Date:  ?Flowsheet Row Cardiac Rehab from 05/24/2021 in [facility] Cardiac and Pulmonary Rehab  ?Date 05/24/21  ? ?  ? ? ?Visit Diagnosis: Status post coronary artery stent placement ? ?Patient's Home Medications on Admission: ? ?Current Outpatient Medications:  ?  albuterol (VENTOLIN HFA) 108 (90 Base) MCG/ACT inhaler, Inhale 2 puffs into the lungs every 6 (six) hours as needed for wheezing or shortness of breath., Disp: 8 g, Rfl: 2 ?  amLODipine (NORVASC) 10 MG tablet, Take 1 tablet (10 mg total) by mouth daily., Disp: 30 tablet, Rfl: 2 ?  aspirin EC 81 MG tablet, Take 81 mg by mouth daily. (Patient not taking: Reported on 05/17/2021), Disp: , Rfl:  ?  clopidogrel (PLAVIX) 75 MG tablet, Take 75 mg by mouth daily., Disp: , Rfl:  ?  Coenzyme Q10 (COQ10) 200 MG CAPS, Take 200 mg by mouth daily., Disp: , Rfl:  ?  furosemide (LASIX) 20 MG tablet, Take 2 tablets (40 mg total) by mouth daily as needed. for weight gain or swelling (Patient taking differently: Take 20 mg by mouth daily. Additional 20 ng if needed for weight gain or swelling), Disp: 30 tablet, Rfl: 2 ?  hydrALAZINE (APRESOLINE) 50 MG tablet, Take 1 tablet (50 mg total) by mouth every 8 (eight) hours., Disp: 90 tablet, Rfl: 2 ?  Iron-Vitamin C (VITRON-C) 65-125 MG TABS, Take 1 tablet by mouth daily., Disp: , Rfl:  ?  isosorbide mononitrate (IMDUR) 60 MG 24 hr tablet, Take 1 tablet (60 mg total) by mouth daily., Disp: 30 tablet, Rfl: 2 ?  metoprolol succinate (TOPROL-XL) 25 MG 24 hr tablet, Take 1 tablet (25 mg total) by mouth daily., Disp: 30 tablet, Rfl: 2 ?  Multiple Vitamins-Minerals (MULTIVITAMIN ADULT PO), Take 1 tablet by mouth daily. Men, Disp: , Rfl:  ?  nitroGLYCERIN (NITROSTAT)  0.4 MG SL tablet, Place under the tongue., Disp: , Rfl:  ?  Omega-3 Fatty Acids (FISH OIL PO), Take by mouth., Disp: , Rfl:  ?  pantoprazole (PROTONIX) 40 MG tablet, Take 40 mg by mouth daily., Disp: , Rfl:  ?  Rivaroxaban (XARELTO) 15 MG TABS tablet, Take 1 tablet (15 mg total) by mouth daily., Disp: 30 tablet, Rfl: 2 ?  rosuvastatin (CRESTOR) 40 MG tablet, Take 20 mg by mouth daily., Disp: , Rfl:  ?  vitamin B-12 (CYANOCOBALAMIN) 1000 MCG tablet, Take 1,000 mcg by mouth daily., Disp: , Rfl:  ? ?Past Medical History: ?Past Medical History:  ?Diagnosis Date  ? Arthritis   ? lower back  ? Back pain   ? 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   ? Complication of anesthesia   ? Violent with hallucinations after procedure 10/2015  ? DDD (degenerative disc disease), lumbar   ? Duodenal ulcer   ? Elevated lipids   ? GERD (gastroesophageal reflux disease)   ? Leg weakness, bilateral   ? s/p lumbar surgery  ? PAD (peripheral artery disease) ([place])   ? Polyradiculitis   ? Stroke [facility])   ? Rt lower leg  estimated 10 - 12 stokes in last 5 years  ? Wears dentures   ? full upper and lower  ? ? ?Tobacco Use: ?Social History  ? ?Tobacco Use  ?Smoking Status Every Day  ?  Packs/day: 1.00  ? Years: 45.00  ? Pack years: 45.00  ? Types: Cigarettes  ?Smokeless Tobacco Never  ?Tobacco Comments  ? 05/17/21- currently 0.5 PPD  ? ? ?Labs: ?Review Flowsheet   ? ?  ?  Latest Ref Rng & Units 09/19/2015 09/20/2015 02/26/2018 01/29/2021  ?Labs for ITP Cardiac and Pulmonary Rehab  ?Cholestrol 0 - 200 mg/dL  175   160     ?LDL (calc) 0 - 99 mg/dL  116   102     ?HDL-C >40 mg/dL  39   35     ?Trlycerides <150 mg/dL  98   117     ?Hemoglobin A1c 4.8 - 5.6 % 6.1     5.2    ? ?  01/31/2021  ?Labs for ITP Cardiac and Pulmonary Rehab  ?Cholestrol 129    ?LDL (calc) 73    ?HDL-C 38    ?Trlycerides 91    ?Hemoglobin A1c   ?  ? ? Multiple values from one day are sorted in reverse-chronological order  ?  ?  ? ? ? ?Exercise Target Goals: ?Exercise Program Goal: ?Individual  exercise prescription set using results from initial 6 min walk test and THRR while considering  patient?s activity barriers and safety.  ? ?Exercise Prescription Goal: ?Initial exercise prescription builds to 30-45 minutes a day of aerobic activity, 2-3 days per week.  Home exercise guidelines will be given to patient during program as part of exercise prescription that the participant will acknowledge. ? ? ?Education: Aerobic Exercise: ?- Group verbal and visual presentation on the components of exercise prescription. Introduces F.I.T.T principle from ACSM for exercise prescriptions.  Reviews F.I.T.T. principles of aerobic exercise including progression. Written material given at graduation. ? ? ?Education: Resistance Exercise: ?- Group verbal and visual presentation on the components of exercise prescription. Introduces F.I.T.T principle from ACSM for exercise prescriptions  Reviews F.I.T.T. principles of resistance exercise including progression. Written material given at graduation. ? ?  ?Education: Exercise & Equipment Safety: ?- Individual verbal instruction and demonstration of equipment use and safety with use of the equipment. ?Flowsheet Row Cardiac Rehab from 06/06/2021 in [facility] Cardiac and Pulmonary Rehab  ?Date 05/24/21  ?Educator AS  ?Instruction Review Code 1- Verbalizes Understanding  ? ?  ? ? ?Education: Exercise Physiology & General Exercise Guidelines: ?- Group verbal and written instruction with models to review the exercise physiology of the cardiovascular system and associated critical values. Provides general exercise guidelines with specific guidelines to those with heart or lung disease.  ? ? ?Education: Flexibility, Balance, Mind/Body Relaxation: ?- Group verbal and visual presentation with interactive activity on the components of exercise prescription. Introduces F.I.T.T principle from ACSM for exercise prescriptions. Reviews F.I.T.T. principles of flexibility and balance exercise training  including progression. Also discusses the mind body connection.  Reviews various relaxation techniques to help reduce and manage stress (i.e. Deep breathing, progressive muscle relaxation, and visualization). Balance handout provided to take home. Written material given at graduation. ?Flowsheet Row Cardiac Rehab from 06/06/2021 in [facility] Cardiac and Pulmonary Rehab  ?Date 06/06/21  ?Educator AS  ?Instruction Review Code 1- Verbalizes Understanding  ? ?  ? ? ?Activity Barriers & Risk Stratification: ? ? ?6 Minute Walk: ? 6 Minute Walk   ? ? [place] Name 05/24/21 1619  ?  ?  ?  ? 6 Minute Walk  ? Phase Initial    ? Distance 1110 feet    ? Walk Time 6 minutes    ? #  of Rest Breaks 0    ? MPH 2.1    ? METS 3.5    ? RPE 13    ? Perceived Dyspnea  2    ? VO2 Peak 12.25    ? Symptoms Yes (comment)    ? Comments SOB    ? Resting HR 62 bpm    ? Resting BP 144/64    ? Resting Oxygen Saturation  95 %    ? Exercise Oxygen Saturation  during 6 min walk 97 %    ? Max Ex. HR 107 bpm    ? Max Ex. BP 194/68    ? 2 Minute Post BP 148/64    ? ?  ?  ? ?  ? ? ?Oxygen Initial Assessment: ? ? ?Oxygen Re-Evaluation: ? ? ?Oxygen Discharge (Final Oxygen Re-Evaluation): ? ? ?Initial Exercise Prescription: ? Initial Exercise Prescription - 05/24/21 1600   ? ?  ? Date of Initial Exercise RX and Referring Provider  ? Date 05/24/21   ? Referring Provider [name]   ?  ? Oxygen  ? Maintain Oxygen Saturation 88% or higher   ?  ? Treadmill  ? MPH 2.1   ? Grade 1.5   ? Minutes 15   ? METs 3   ?  ? Recumbant Bike  ? Level 2   ? RPM 60   ? Watts 40   ? Minutes 15   ? METs 3.5   ?  ? NuStep  ? Level 3   ? SPM 80   ? Minutes 15   ? METs 3.5   ?  ? Track  ? Laps 30   ? Minutes 2.1   ? METs 2.6   ?  ? Prescription Details  ? Frequency (times per week) 3   ? Duration Progress to 30 minutes of continuous aerobic without signs/symptoms of physical distress   ?  ? Intensity  ? THRR 40-80% of Max Heartrate 97-133   ? Ratings of Perceived Exertion 11-13   ? Perceived  Dyspnea 0-4   ?  ? Resistance Training  ? Training Prescription Yes   ? Weight 4 lb   ? Reps 10-15   ? ?  ?  ? ?  ? ? ?Perform Capillary Blood Glucose checks as needed. ? ?Exercise Prescription Changes: ? ? Exer

## 2021-06-08 ENCOUNTER — Other Ambulatory Visit: Payer: Self-pay

## 2021-06-08 ENCOUNTER — Encounter: Payer: Medicare HMO | Admitting: *Deleted

## 2021-06-08 DIAGNOSIS — Z48812 Encounter for surgical aftercare following surgery on the circulatory system: Secondary | ICD-10-CM | POA: Diagnosis not present

## 2021-06-08 DIAGNOSIS — Z955 Presence of coronary angioplasty implant and graft: Secondary | ICD-10-CM | POA: Diagnosis not present

## 2021-06-08 NOTE — Progress Notes (Signed)
Daily Session Note ? ?Patient Details  ?Name: Gary Mccoy. ?MRN: [id number] ?Date of Birth: Oct 30, 1950 ?Referring Provider:   ?Flowsheet Row Cardiac Rehab from 05/24/2021 in [facility] Cardiac and Pulmonary Rehab  ?Referring Provider [name]  ? ?  ? ? ?Encounter Date: 06/08/2021 ? ?Check In: ? Session Check In - 06/08/21 0809   ? ?  ? Check-In  ? Supervising physician immediately available to respond to emergencies See telemetry face sheet for immediately available ER MD   ? Location [facility]-Cardiac & Pulmonary Rehab   ? Staff Present Heath Lark, RN, BSN, CCRP;Joseph [place], [place], [place], [place], [place], CCET   ? Virtual Visit No   ? Medication changes reported     No   ? Fall or balance concerns reported    No   ? Warm-up and Cool-down Performed on first and last piece of equipment   ? Resistance Training Performed Yes   ? VAD Patient? No   ? PAD/SET Patient? No   ?  ? Pain Assessment  ? Currently in Pain? No/denies   ? ?  ?  ? ?  ? ? ? ? ? ?Social History  ? ?Tobacco Use  ?Smoking Status Every Day  ? Packs/day: 1.00  ? Years: 45.00  ? Pack years: 45.00  ? Types: Cigarettes  ?Smokeless Tobacco Never  ?Tobacco Comments  ? 05/17/21- currently 0.5 PPD  ? ? ?Goals Met:  ?Independence with exercise equipment ?Exercise tolerated well ?No report of concerns or symptoms today ? ?Goals Unmet:  ?Not Applicable ? ?Comments: Pt able to follow exercise prescription today without complaint.  Will continue to monitor for progression.  ? ? ?Dr. Emily Filbert is Medical Director for [place].  ?Dr. Ottie Glazier is Medical Director for [facility] Pulmonary Rehabilitation. ?

## 2021-06-11 ENCOUNTER — Other Ambulatory Visit: Payer: Self-pay

## 2021-06-11 ENCOUNTER — Encounter: Payer: Medicare HMO | Admitting: *Deleted

## 2021-06-11 DIAGNOSIS — Z955 Presence of coronary angioplasty implant and graft: Secondary | ICD-10-CM | POA: Diagnosis not present

## 2021-06-11 DIAGNOSIS — Z48812 Encounter for surgical aftercare following surgery on the circulatory system: Secondary | ICD-10-CM | POA: Diagnosis not present

## 2021-06-11 NOTE — Progress Notes (Signed)
Daily Session Note ? ?Patient Details  ?Name: Kepler A Phil Dopp. ?MRN: [id number] ?Date of Birth: 1950/10/03 ?Referring Provider:   ?Flowsheet Row Cardiac Rehab from 05/24/2021 in [facility] Cardiac and Pulmonary Rehab  ?Referring Provider [name]  ? ?  ? ? ?Encounter Date: 06/11/2021 ? ?Check In: ? Session Check In - 06/11/21 0811   ? ?  ? Check-In  ? Supervising physician immediately available to respond to emergencies See telemetry face sheet for immediately available ER MD   ? Location [facility]-Cardiac & Pulmonary Rehab   ? Staff Present Heath Lark, RN, BSN, [place], BS, ACSM CEP, Exercise Physiologist;Joseph [place], [place]   ? Virtual Visit No   ? Medication changes reported     No   ? Fall or balance concerns reported    No   ? Warm-up and Cool-down Performed on first and last piece of equipment   ? Resistance Training Performed Yes   ? VAD Patient? No   ? PAD/SET Patient? No   ?  ? Pain Assessment  ? Currently in Pain? No/denies   ? ?  ?  ? ?  ? ? ? ? ? ?Social History  ? ?Tobacco Use  ?Smoking Status Every Day  ? Packs/day: 1.00  ? Years: 45.00  ? Pack years: 45.00  ? Types: Cigarettes  ?Smokeless Tobacco Never  ?Tobacco Comments  ? 05/17/21- currently 0.5 PPD  ? ? ?Goals Met:  ?Independence with exercise equipment ?Exercise tolerated well ?No report of concerns or symptoms today ? ?Goals Unmet:  ?Not Applicable ? ?Comments: Pt able to follow exercise prescription today without complaint.  Will continue to monitor for progression. ? ? ? ?Dr. Emily Filbert is Medical Director for [place].  ?Dr. Ottie Glazier is Medical Director for [facility] Pulmonary Rehabilitation. ?

## 2021-06-13 ENCOUNTER — Other Ambulatory Visit: Payer: Self-pay

## 2021-06-13 DIAGNOSIS — N184 Chronic kidney disease, stage 4 (severe): Secondary | ICD-10-CM | POA: Diagnosis not present

## 2021-06-13 DIAGNOSIS — Z48812 Encounter for surgical aftercare following surgery on the circulatory system: Secondary | ICD-10-CM | POA: Diagnosis not present

## 2021-06-13 DIAGNOSIS — Z955 Presence of coronary angioplasty implant and graft: Secondary | ICD-10-CM | POA: Diagnosis not present

## 2021-06-13 DIAGNOSIS — Z95820 Peripheral vascular angioplasty status with implants and grafts: Secondary | ICD-10-CM | POA: Diagnosis not present

## 2021-06-13 DIAGNOSIS — Z0000 Encounter for general adult medical examination without abnormal findings: Secondary | ICD-10-CM | POA: Diagnosis not present

## 2021-06-13 DIAGNOSIS — I509 Heart failure, unspecified: Secondary | ICD-10-CM | POA: Diagnosis not present

## 2021-06-13 NOTE — Progress Notes (Signed)
Daily Session Note ? ?Patient Details  ?Name: Gary Mccoy. ?MRN: [id number] ?Date of Birth: 06/03/1950 ?Referring Provider:   ?Flowsheet Row Cardiac Rehab from 05/24/2021 in [facility] Cardiac and Pulmonary Rehab  ?Referring Provider [name]  ? ?  ? ? ?Encounter Date: 06/13/2021 ? ?Check In: ? Session Check In - 06/13/21 0714   ? ?  ? Check-In  ? Supervising physician immediately available to respond to emergencies See telemetry face sheet for immediately available ER MD   ? Location [facility]-Cardiac & Pulmonary Rehab   ? Staff Present Birdie Sons, MPA, RN;Jessica Hayden, MA, RCEP, CCRP, CCET;Amanda Sommer, BA, ACSM CEP, Exercise Physiologist;Joseph [place], [place]   ? Virtual Visit No   ? Medication changes reported     No   ? Fall or balance concerns reported    No   ? Tobacco Cessation No Change   ? Warm-up and Cool-down Performed on first and last piece of equipment   ? Resistance Training Performed Yes   ? VAD Patient? No   ? PAD/SET Patient? No   ?  ? Pain Assessment  ? Currently in Pain? No/denies   ? ?  ?  ? ?  ? ? ? ? ? ?Social History  ? ?Tobacco Use  ?Smoking Status Every Day  ? Packs/day: 1.00  ? Years: 45.00  ? Pack years: 45.00  ? Types: Cigarettes  ?Smokeless Tobacco Never  ?Tobacco Comments  ? 05/17/21- currently 0.5 PPD  ? ? ?Goals Met:  ?Independence with exercise equipment ?Exercise tolerated well ?No report of concerns or symptoms today ?Strength training completed today ? ?Goals Unmet:  ?Not Applicable ? ?Comments: Pt able to follow exercise prescription today without complaint.  Will continue to monitor for progression. ? ? ? ?Dr. Emily Filbert is Medical Director for [place].  ?Dr. Ottie Glazier is Medical Director for [facility] Pulmonary Rehabilitation. ?

## 2021-06-15 ENCOUNTER — Encounter: Payer: Medicare HMO | Admitting: *Deleted

## 2021-06-15 DIAGNOSIS — Z955 Presence of coronary angioplasty implant and graft: Secondary | ICD-10-CM | POA: Diagnosis not present

## 2021-06-15 DIAGNOSIS — Z48812 Encounter for surgical aftercare following surgery on the circulatory system: Secondary | ICD-10-CM | POA: Diagnosis not present

## 2021-06-15 NOTE — Progress Notes (Signed)
Daily Session Note ? ?Patient Details  ?Name: Gary Mccoy. ?MRN: [id number] ?Date of Birth: Nov 02, 1950 ?Referring Provider:   ?Flowsheet Row Cardiac Rehab from 05/24/2021 in [facility] Cardiac and Pulmonary Rehab  ?Referring Provider [name]  ? ?  ? ? ?Encounter Date: 06/15/2021 ? ?Check In: ? Session Check In - 06/15/21 0756   ? ?  ? Check-In  ? Supervising physician immediately available to respond to emergencies See telemetry face sheet for immediately available ER MD   ? Location [facility]-Cardiac & Pulmonary Rehab   ? Staff Present Heath Lark, RN, BSN, CCRP;Joseph [place], RCP,RRT,BSRT;Melissa [place], [place], LDN   ? Virtual Visit No   ? Medication changes reported     No   ? Fall or balance concerns reported    No   ? Tobacco Cessation No Change   ? Current number of cigarettes/nicotine per day     10   ? Warm-up and Cool-down Performed on first and last piece of equipment   ? Resistance Training Performed Yes   ? VAD Patient? No   ? PAD/SET Patient? No   ?  ? Pain Assessment  ? Currently in Pain? No/denies   ? ?  ?  ? ?  ? ? ? ? ? ?Social History  ? ?Tobacco Use  ?Smoking Status Every Day  ? Packs/day: 1.00  ? Years: 45.00  ? Pack years: 45.00  ? Types: Cigarettes  ?Smokeless Tobacco Never  ?Tobacco Comments  ? 05/17/21- currently 0.5 PPD  ? ? ?Goals Met:  ?Independence with exercise equipment ?Exercise tolerated well ?No report of concerns or symptoms today ? ?Goals Unmet:  ?Not Applicable ? ?Comments: Pt able to follow exercise prescription today without complaint.  Will continue to monitor for progression. ? ? ? ?Dr. Emily Filbert is Medical Director for [place].  ?Dr. Ottie Glazier is Medical Director for [facility] Pulmonary Rehabilitation. ?

## 2021-06-18 ENCOUNTER — Encounter: Payer: Medicare HMO | Attending: Internal Medicine | Admitting: *Deleted

## 2021-06-18 DIAGNOSIS — Z955 Presence of coronary angioplasty implant and graft: Secondary | ICD-10-CM | POA: Insufficient documentation

## 2021-06-18 NOTE — Progress Notes (Signed)
Daily Session Note ? ?Patient Details  ?Name: Gary Mccoy. ?MRN: [id number] ?Date of Birth: 1950-10-18 ?Referring Provider:   ?Flowsheet Row Cardiac Rehab from 05/24/2021 in [facility] Cardiac and Pulmonary Rehab  ?Referring Provider [name]  ? ?  ? ? ?Encounter Date: 06/18/2021 ? ?Check In: ? Session Check In - 06/18/21 0817   ? ?  ? Check-In  ? Supervising physician immediately available to respond to emergencies See telemetry face sheet for immediately available ER MD   ? Location [facility]-Cardiac & Pulmonary Rehab   ? Staff Present Heath Lark, RN, BSN, CCRP;Joseph [place], RCP,RRT,BSRT;Kelly [place], [place], ACSM CEP, Exercise Physiologist   ? Virtual Visit No   ? Medication changes reported     No   ? Fall or balance concerns reported    No   ? Warm-up and Cool-down Performed on first and last piece of equipment   ? Resistance Training Performed Yes   ? VAD Patient? No   ? PAD/SET Patient? No   ?  ? Pain Assessment  ? Currently in Pain? No/denies   ? ?  ?  ? ?  ? ? ? ? ? ?Social History  ? ?Tobacco Use  ?Smoking Status Every Day  ? Packs/day: 1.00  ? Years: 45.00  ? Pack years: 45.00  ? Types: Cigarettes  ?Smokeless Tobacco Never  ?Tobacco Comments  ? 05/17/21- currently 0.5 PPD  ? ? ?Goals Met:  ?Independence with exercise equipment ?Exercise tolerated well ?No report of concerns or symptoms today ? ?Goals Unmet:  ?Not Applicable ? ?Comments: Pt able to follow exercise prescription today without complaint.  Will continue to monitor for progression. ? ? ? ?Dr. Emily Filbert is Medical Director for [place].  ?Dr. Ottie Glazier is Medical Director for [facility] Pulmonary Rehabilitation. ?

## 2021-06-20 DIAGNOSIS — Z955 Presence of coronary angioplasty implant and graft: Secondary | ICD-10-CM | POA: Diagnosis not present

## 2021-06-20 NOTE — Progress Notes (Signed)
Daily Session Note ? ?Patient Details  ?Name: Gary Mccoy. ?MRN: [id number] ?Date of Birth: 09/04/1950 ?Referring Provider:   ?Flowsheet Row Cardiac Rehab from 05/24/2021 in [facility] Cardiac and Pulmonary Rehab  ?Referring Provider [name]  ? ?  ? ? ?Encounter Date: 06/20/2021 ? ?Check In: ? Session Check In - 06/20/21 0725   ? ?  ? Check-In  ? Supervising physician immediately available to respond to emergencies See telemetry face sheet for immediately available ER MD   ? Location [facility]-Cardiac & Pulmonary Rehab   ? Staff Present Birdie Sons, MPA, RN;Joseph [place], RCP,RRT,BSRT;Jessica [place], MA, RCEP, CCRP, CCET   ? Virtual Visit No   ? Medication changes reported     No   ? Fall or balance concerns reported    No   ? Tobacco Cessation No Change   ? Warm-up and Cool-down Performed on first and last piece of equipment   ? Resistance Training Performed Yes   ? VAD Patient? No   ? PAD/SET Patient? No   ?  ? Pain Assessment  ? Currently in Pain? No/denies   ? ?  ?  ? ?  ? ? ? ? ? ?Social History  ? ?Tobacco Use  ?Smoking Status Every Day  ? Packs/day: 1.00  ? Years: 45.00  ? Pack years: 45.00  ? Types: Cigarettes  ?Smokeless Tobacco Never  ?Tobacco Comments  ? 05/17/21- currently 0.5 PPD  ? ? ?Goals Met:  ?Independence with exercise equipment ?Exercise tolerated well ?No report of concerns or symptoms today ?Strength training completed today ? ?Goals Unmet:  ?Not Applicable ? ?Comments: Pt able to follow exercise prescription today without complaint.  Will continue to monitor for progression. ? ? ? ?Dr. Emily Filbert is Medical Director for [place].  ?Dr. Ottie Glazier is Medical Director for [facility] Pulmonary Rehabilitation. ?

## 2021-06-25 ENCOUNTER — Encounter: Payer: Medicare HMO | Admitting: *Deleted

## 2021-06-25 DIAGNOSIS — Z955 Presence of coronary angioplasty implant and graft: Secondary | ICD-10-CM | POA: Diagnosis not present

## 2021-06-25 NOTE — Progress Notes (Signed)
Daily Session Note ? ?Patient Details  ?Name: Gary Mccoy. ?MRN: [id number] ?Date of Birth: 02-11-1951 ?Referring Provider:   ?Flowsheet Row Cardiac Rehab from 05/24/2021 in [facility] Cardiac and Pulmonary Rehab  ?Referring Provider [name]  ? ?  ? ? ?Encounter Date: 06/25/2021 ? ?Check In: ? Session Check In - 06/25/21 0817   ? ?  ? Check-In  ? Supervising physician immediately available to respond to emergencies See telemetry face sheet for immediately available ER MD   ? Location [facility]-Cardiac & Pulmonary Rehab   ? Staff Present Heath Lark, RN, BSN, CCRP;Laureen Owens [place]ark, BS, RRT, CPFT;Kelly Amedeo Plenty, BS, ACSM CEP, Exercise Physiologist;Joseph [place], [place]   ? Virtual Visit No   ? Medication changes reported     No   ? Fall or balance concerns reported    No   ? Warm-up and Cool-down Performed on first and last piece of equipment   ? Resistance Training Performed Yes   ? VAD Patient? No   ? PAD/SET Patient? No   ?  ? Pain Assessment  ? Currently in Pain? No/denies   ? ?  ?  ? ?  ? ? ? ? ? ?Social History  ? ?Tobacco Use  ?Smoking Status Every Day  ? Packs/day: 1.00  ? Years: 45.00  ? Pack years: 45.00  ? Types: Cigarettes  ?Smokeless Tobacco Never  ?Tobacco Comments  ? 05/17/21- currently 0.5 PPD  ? ? ?Goals Met:  ?Independence with exercise equipment ?Exercise tolerated well ?No report of concerns or symptoms today ? ?Goals Unmet:  ?Not Applicable ? ?Comments: Pt able to follow exercise prescription today without complaint.  Will continue to monitor for progression. ? ? ? ?Dr. Emily Filbert is Medical Director for [place].  ?Dr. Ottie Glazier is Medical Director for [facility] Pulmonary Rehabilitation. ?

## 2021-06-27 ENCOUNTER — Encounter: Payer: Medicare HMO | Admitting: *Deleted

## 2021-06-27 DIAGNOSIS — Z955 Presence of coronary angioplasty implant and graft: Secondary | ICD-10-CM | POA: Diagnosis not present

## 2021-06-27 NOTE — Progress Notes (Signed)
Daily Session Note ? ?Patient Details  ?Name: Gary Mccoy. ?MRN: [id number] ?Date of Birth: 15-Mar-1951 ?Referring Provider:   ?Flowsheet Row Cardiac Rehab from 05/24/2021 in [facility] Cardiac and Pulmonary Rehab  ?Referring Provider [name]  ? ?  ? ? ?Encounter Date: 06/27/2021 ? ?Check In: ? Session Check In - 06/27/21 0817   ? ?  ? Check-In  ? Supervising physician immediately available to respond to emergencies See telemetry face sheet for immediately available ER MD   ? Location [facility]-Cardiac & Pulmonary Rehab   ? Staff Present Heath Lark, RN, BSN, CCRP;Joseph [place], RCP,RRT,BSRT;Melissa Remy, RDN, [place], BA, ACSM CEP, Exercise Physiologist   ? Virtual Visit No   ? Medication changes reported     No   ? Fall or balance concerns reported    No   ? Warm-up and Cool-down Performed on first and last piece of equipment   ? Resistance Training Performed Yes   ? VAD Patient? No   ? PAD/SET Patient? No   ?  ? Pain Assessment  ? Currently in Pain? No/denies   ? ?  ?  ? ?  ? ? ? ? ? ?Social History  ? ?Tobacco Use  ?Smoking Status Every Day  ? Packs/day: 1.00  ? Years: 45.00  ? Pack years: 45.00  ? Types: Cigarettes  ?Smokeless Tobacco Never  ?Tobacco Comments  ? 05/17/21- currently 0.5 PPD  ? ? ?Goals Met:  ?Independence with exercise equipment ?Exercise tolerated well ?No report of concerns or symptoms today ? ?Goals Unmet:  ?Not Applicable ? ?Comments: Pt able to follow exercise prescription today without complaint.  Will continue to monitor for progression. ? ? ? ?Dr. Emily Filbert is Medical Director for [place].  ?Dr. Ottie Glazier is Medical Director for [facility] Pulmonary Rehabilitation. ?

## 2021-07-02 ENCOUNTER — Encounter: Payer: Medicare HMO | Admitting: *Deleted

## 2021-07-02 DIAGNOSIS — Z955 Presence of coronary angioplasty implant and graft: Secondary | ICD-10-CM | POA: Diagnosis not present

## 2021-07-02 NOTE — Progress Notes (Signed)
Daily Session Note ? ?Patient Details  ?Name: Gary Mccoy. ?MRN: [id number] ?Date of Birth: 1951-03-17 ?Referring Provider:   ?Flowsheet Row Cardiac Rehab from 05/24/2021 in [facility] Cardiac and Pulmonary Rehab  ?Referring Provider [name]  ? ?  ? ? ?Encounter Date: 07/02/2021 ? ?Check In: ? Session Check In - 07/02/21 0809   ? ?  ? Check-In  ? Supervising physician immediately available to respond to emergencies See telemetry face sheet for immediately available ER MD   ? Location [facility]-Cardiac & Pulmonary Rehab   ? Staff Present Heath Lark, RN, BSN, Laveda Norman, BS, ACSM CEP, Exercise Physiologist;Joseph [place], [place]   ? Virtual Visit No   ? Medication changes reported     No   ? Fall or balance concerns reported    No   ? Warm-up and Cool-down Performed on first and last piece of equipment   ? Resistance Training Performed Yes   ? VAD Patient? No   ? PAD/SET Patient? No   ?  ? Pain Assessment  ? Currently in Pain? No/denies   ? ?  ?  ? ?  ? ? ? ? ? ?Social History  ? ?Tobacco Use  ?Smoking Status Every Day  ? Packs/day: 1.00  ? Years: 45.00  ? Pack years: 45.00  ? Types: Cigarettes  ?Smokeless Tobacco Never  ?Tobacco Comments  ? 05/17/21- currently 0.5 PPD  ? ? ?Goals Met:  ?Independence with exercise equipment ?Exercise tolerated well ?No report of concerns or symptoms today ? ?Goals Unmet:  ?Not Applicable ? ?Comments: Pt able to follow exercise prescription today without complaint.  Will continue to monitor for progression. ? ? ? ?Dr. Emily Filbert is Medical Director for [place].  ?Dr. Ottie Glazier is Medical Director for [facility] Pulmonary Rehabilitation. ?

## 2021-07-04 ENCOUNTER — Encounter: Payer: Self-pay | Admitting: *Deleted

## 2021-07-04 DIAGNOSIS — Z955 Presence of coronary angioplasty implant and graft: Secondary | ICD-10-CM

## 2021-07-04 NOTE — Progress Notes (Signed)
Daily Session Note ? ?Patient Details  ?Name: Tevis A Phil Dopp. ?MRN: [id number] ?Date of Birth: 1951/03/16 ?Referring Provider:   ?Flowsheet Row Cardiac Rehab from 05/24/2021 in [facility] Cardiac and Pulmonary Rehab  ?Referring Provider [name]  ? ?  ? ? ?Encounter Date: 07/04/2021 ? ?Check In: ? Session Check In - 07/04/21 0735   ? ?  ? Check-In  ? Supervising physician immediately available to respond to emergencies See telemetry face sheet for immediately available ER MD   ? Location [facility]-Cardiac & Pulmonary Rehab   ? Staff Present Birdie Sons, MPA, RN;Amanda Sommer, BA, ACSM CEP, Exercise Physiologist;Joseph [place], [place]   ? Virtual Visit No   ? Medication changes reported     No   ? Fall or balance concerns reported    No   ? Tobacco Cessation No Change   ? Warm-up and Cool-down Performed on first and last piece of equipment   ? Resistance Training Performed Yes   ? VAD Patient? No   ? PAD/SET Patient? No   ?  ? Pain Assessment  ? Currently in Pain? No/denies   ? ?  ?  ? ?  ? ? ? ? ? ?Social History  ? ?Tobacco Use  ?Smoking Status Every Day  ? Packs/day: 1.00  ? Years: 45.00  ? Pack years: 45.00  ? Types: Cigarettes  ?Smokeless Tobacco Never  ?Tobacco Comments  ? 05/17/21- currently 0.5 PPD  ? ? ?Goals Met:  ?Independence with exercise equipment ?Exercise tolerated well ?No report of concerns or symptoms today ?Strength training completed today ? ?Goals Unmet:  ?Not Applicable ? ?Comments: Pt able to follow exercise prescription today without complaint.  Will continue to monitor for progression. ? ?Reviewed home exercise with pt today.  Pt plans to walk at home for exercise.  Reviewed THR, pulse, RPE, sign and symptoms, pulse oximetery and when to call 911 or MD.  Also discussed weather considerations and indoor options.  Pt voiced understanding. ? ? ?Dr. Emily Filbert is Medical Director for [place].  ?Dr. Ottie Glazier is Medical Director for [facility] Pulmonary Rehabilitation. ?

## 2021-07-04 NOTE — Progress Notes (Signed)
Cardiac Individual Treatment Plan ? ?Patient Details  ?Name: Gary Mccoy. ?MRN: [id number] ?Date of Birth: 07/15/1950 ?Referring Provider:   ?Flowsheet Row Cardiac Rehab from 05/24/2021 in [facility] Cardiac and Pulmonary Rehab  ?Referring Provider [name]  ? ?  ? ? ?Initial Encounter Date:  ?Flowsheet Row Cardiac Rehab from 05/24/2021 in [facility] Cardiac and Pulmonary Rehab  ?Date 05/24/21  ? ?  ? ? ?Visit Diagnosis: Status post coronary artery stent placement ? ?Patient's Home Medications on Admission: ? ?Current Outpatient Medications:  ?  albuterol (VENTOLIN HFA) 108 (90 Base) MCG/ACT inhaler, Inhale 2 puffs into the lungs every 6 (six) hours as needed for wheezing or shortness of breath., Disp: 8 g, Rfl: 2 ?  amLODipine (NORVASC) 10 MG tablet, Take 1 tablet (10 mg total) by mouth daily., Disp: 30 tablet, Rfl: 2 ?  aspirin EC 81 MG tablet, Take 81 mg by mouth daily. (Patient not taking: Reported on 05/17/2021), Disp: , Rfl:  ?  clopidogrel (PLAVIX) 75 MG tablet, Take 75 mg by mouth daily., Disp: , Rfl:  ?  Coenzyme Q10 (COQ10) 200 MG CAPS, Take 200 mg by mouth daily., Disp: , Rfl:  ?  furosemide (LASIX) 20 MG tablet, Take 2 tablets (40 mg total) by mouth daily as needed. for weight gain or swelling (Patient taking differently: Take 20 mg by mouth daily. Additional 20 ng if needed for weight gain or swelling), Disp: 30 tablet, Rfl: 2 ?  hydrALAZINE (APRESOLINE) 50 MG tablet, Take 1 tablet (50 mg total) by mouth every 8 (eight) hours., Disp: 90 tablet, Rfl: 2 ?  Iron-Vitamin C (VITRON-C) 65-125 MG TABS, Take 1 tablet by mouth daily., Disp: , Rfl:  ?  isosorbide mononitrate (IMDUR) 60 MG 24 hr tablet, Take 1 tablet (60 mg total) by mouth daily., Disp: 30 tablet, Rfl: 2 ?  metoprolol succinate (TOPROL-XL) 25 MG 24 hr tablet, Take 1 tablet (25 mg total) by mouth daily., Disp: 30 tablet, Rfl: 2 ?  Multiple Vitamins-Minerals (MULTIVITAMIN ADULT PO), Take 1 tablet by mouth daily. Men, Disp: , Rfl:  ?  nitroGLYCERIN (NITROSTAT)  0.4 MG SL tablet, Place under the tongue., Disp: , Rfl:  ?  Omega-3 Fatty Acids (FISH OIL PO), Take by mouth., Disp: , Rfl:  ?  pantoprazole (PROTONIX) 40 MG tablet, Take 40 mg by mouth daily., Disp: , Rfl:  ?  Rivaroxaban (XARELTO) 15 MG TABS tablet, Take 1 tablet (15 mg total) by mouth daily., Disp: 30 tablet, Rfl: 2 ?  rosuvastatin (CRESTOR) 40 MG tablet, Take 20 mg by mouth daily., Disp: , Rfl:  ?  vitamin B-12 (CYANOCOBALAMIN) 1000 MCG tablet, Take 1,000 mcg by mouth daily., Disp: , Rfl:  ? ?Past Medical History: ?Past Medical History:  ?Diagnosis Date  ? Arthritis   ? lower back  ? Back pain   ? 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   ? Complication of anesthesia   ? Violent with hallucinations after procedure 10/2015  ? DDD (degenerative disc disease), lumbar   ? Duodenal ulcer   ? Elevated lipids   ? GERD (gastroesophageal reflux disease)   ? Leg weakness, bilateral   ? s/p lumbar surgery  ? PAD (peripheral artery disease) ([place])   ? Polyradiculitis   ? Stroke [facility])   ? Rt lower leg  estimated 10 - 12 stokes in last 5 years  ? Wears dentures   ? full upper and lower  ? ? ?Tobacco Use: ?Social History  ? ?Tobacco Use  ?Smoking Status Every Day  ?  Packs/day: 1.00  ? Years: 45.00  ? Pack years: 45.00  ? Types: Cigarettes  ?Smokeless Tobacco Never  ?Tobacco Comments  ? 05/17/21- currently 0.5 PPD  ? ? ?Labs: ?Review Flowsheet   ? ?  ?  Latest Ref Rng & Units 09/19/2015 09/20/2015 02/26/2018 01/29/2021  ?Labs for ITP Cardiac and Pulmonary Rehab  ?Cholestrol 0 - 200 mg/dL  175   160     ?LDL (calc) 0 - 99 mg/dL  116   102     ?HDL-C >40 mg/dL  39   35     ?Trlycerides <150 mg/dL  98   117     ?Hemoglobin A1c 4.8 - 5.6 % 6.1     5.2    ? ?  01/31/2021  ?Labs for ITP Cardiac and Pulmonary Rehab  ?Cholestrol 129    ?LDL (calc) 73    ?HDL-C 38    ?Trlycerides 91    ?Hemoglobin A1c   ?  ? ? Multiple values from one day are sorted in reverse-chronological order  ?  ?  ? ? ? ?Exercise Target Goals: ?Exercise Program Goal: ?Individual  exercise prescription set using results from initial 6 min walk test and THRR while considering  patient?s activity barriers and safety.  ? ?Exercise Prescription Goal: ?Initial exercise prescription builds to 30-45 minutes a day of aerobic activity, 2-3 days per week.  Home exercise guidelines will be given to patient during program as part of exercise prescription that the participant will acknowledge. ? ? ?Education: Aerobic Exercise: ?- Group verbal and visual presentation on the components of exercise prescription. Introduces F.I.T.T principle from ACSM for exercise prescriptions.  Reviews F.I.T.T. principles of aerobic exercise including progression. Written material given at graduation. ? ? ?Education: Resistance Exercise: ?- Group verbal and visual presentation on the components of exercise prescription. Introduces F.I.T.T principle from ACSM for exercise prescriptions  Reviews F.I.T.T. principles of resistance exercise including progression. Written material given at graduation. ? ?  ?Education: Exercise & Equipment Safety: ?- Individual verbal instruction and demonstration of equipment use and safety with use of the equipment. ?Flowsheet Row Cardiac Rehab from 06/27/2021 in [facility] Cardiac and Pulmonary Rehab  ?Date 05/24/21  ?Educator AS  ?Instruction Review Code 1- Verbalizes Understanding  ? ?  ? ? ?Education: Exercise Physiology & General Exercise Guidelines: ?- Group verbal and written instruction with models to review the exercise physiology of the cardiovascular system and associated critical values. Provides general exercise guidelines with specific guidelines to those with heart or lung disease.  ? ? ?Education: Flexibility, Balance, Mind/Body Relaxation: ?- Group verbal and visual presentation with interactive activity on the components of exercise prescription. Introduces F.I.T.T principle from ACSM for exercise prescriptions. Reviews F.I.T.T. principles of flexibility and balance exercise training  including progression. Also discusses the mind body connection.  Reviews various relaxation techniques to help reduce and manage stress (i.e. Deep breathing, progressive muscle relaxation, and visualization). Balance handout provided to take home. Written material given at graduation. ?Flowsheet Row Cardiac Rehab from 06/27/2021 in [facility] Cardiac and Pulmonary Rehab  ?Date 06/06/21  ?Educator AS  ?Instruction Review Code 1- Verbalizes Understanding  ? ?  ? ? ?Activity Barriers & Risk Stratification: ? ? ?6 Minute Walk: ? 6 Minute Walk   ? ? [place] Name 05/24/21 1619  ?  ?  ?  ? 6 Minute Walk  ? Phase Initial    ? Distance 1110 feet    ? Walk Time 6 minutes    ? #  of Rest Breaks 0    ? MPH 2.1    ? METS 3.5    ? RPE 13    ? Perceived Dyspnea  2    ? VO2 Peak 12.25    ? Symptoms Yes (comment)    ? Comments SOB    ? Resting HR 62 bpm    ? Resting BP 144/64    ? Resting Oxygen Saturation  95 %    ? Exercise Oxygen Saturation  during 6 min walk 97 %    ? Max Ex. HR 107 bpm    ? Max Ex. BP 194/68    ? 2 Minute Post BP 148/64    ? ?  ?  ? ?  ? ? ?Oxygen Initial Assessment: ? ? ?Oxygen Re-Evaluation: ? ? ?Oxygen Discharge (Final Oxygen Re-Evaluation): ? ? ?Initial Exercise Prescription: ? Initial Exercise Prescription - 05/24/21 1600   ? ?  ? Date of Initial Exercise RX and Referring Provider  ? Date 05/24/21   ? Referring Provider [name]   ?  ? Oxygen  ? Maintain Oxygen Saturation 88% or higher   ?  ? Treadmill  ? MPH 2.1   ? Grade 1.5   ? Minutes 15   ? METs 3   ?  ? Recumbant Bike  ? Level 2   ? RPM 60   ? Watts 40   ? Minutes 15   ? METs 3.5   ?  ? NuStep  ? Level 3   ? SPM 80   ? Minutes 15   ? METs 3.5   ?  ? Track  ? Laps 30   ? Minutes 2.1   ? METs 2.6   ?  ? Prescription Details  ? Frequency (times per week) 3   ? Duration Progress to 30 minutes of continuous aerobic without signs/symptoms of physical distress   ?  ? Intensity  ? THRR 40-80% of Max Heartrate 97-133   ? Ratings of Perceived Exertion 11-13   ? Perceived  Dyspnea 0-4   ?  ? Resistance Training  ? Training Prescription Yes   ? Weight 4 lb   ? Reps 10-15   ? ?  ?  ? ?  ? ? ?Perform Capillary Blood Glucose checks as needed. ? ?Exercise Prescription Changes: ? ? Exer

## 2021-07-06 ENCOUNTER — Encounter: Payer: Medicare HMO | Admitting: *Deleted

## 2021-07-06 DIAGNOSIS — Z955 Presence of coronary angioplasty implant and graft: Secondary | ICD-10-CM

## 2021-07-06 NOTE — Progress Notes (Signed)
Daily Session Note ? ?Patient Details  ?Name: Gary Mccoy. ?MRN: [id number] ?Date of Birth: [birth year] ?Referring Provider:   ?Flowsheet Row Cardiac Rehab from 05/24/2021 in [facility] Cardiac and Pulmonary Rehab  ?Referring Provider Gary Mccoy  ? ?  ? ? ?Encounter Date: 07/06/2021 ? ?Check In: ? Session Check In - 07/06/21 0756   ? ?  ? Check-In  ? Supervising physician immediately available to respond to emergencies See telemetry face sheet for immediately available ER MD   ? Location [facility]-Cardiac & Pulmonary Rehab   ? Staff Present Gary Lark, RN, BSN, CCRP;Gary Mccoy, [place], [place], [place], [place], CCET   ? Virtual Visit No   ? Medication changes reported     No   ? Fall or balance concerns reported    No   ? Warm-up and Cool-down Performed on first and last piece of equipment   ? Resistance Training Performed Yes   ? VAD Patient? No   ? PAD/SET Patient? No   ?  ? Pain Assessment  ? Currently in Pain? No/denies   ? ?  ?  ? ?  ? ? ? ? ? ?Social History  ? ?Tobacco Use  ?Smoking Status Every Day  ? Packs/day: 1.00  ? Years: 45.00  ? Pack years: 45.00  ? Types: Cigarettes  ?Smokeless Tobacco Never  ?Tobacco Comments  ? 05/17/21- currently 0.5 PPD  ? ? ?Goals Met:  ?Independence with exercise equipment ?Exercise tolerated well ?No report of concerns or symptoms today ? ?Goals Unmet:  ?Not Applicable ? ?Comments: Pt able to follow exercise prescription today without complaint.  Will continue to monitor for progression. ? ? ? ?Dr. Emily Mccoy is Medical Director for [place].  ?Dr. Ottie Mccoy is Medical Director for [facility] Pulmonary Rehabilitation. ?

## 2021-07-09 ENCOUNTER — Encounter: Payer: Medicare HMO | Admitting: *Deleted

## 2021-07-09 DIAGNOSIS — Z955 Presence of coronary angioplasty implant and graft: Secondary | ICD-10-CM | POA: Diagnosis not present

## 2021-07-09 NOTE — Progress Notes (Signed)
Daily Session Note ? ?Patient Details  ?Name: Gary Mccoy. ?MRN: [id number] ?Date of Birth: 01-10-1951 ?Referring Provider:   ?Flowsheet Row Cardiac Rehab from 05/24/2021 in [facility] Cardiac and Pulmonary Rehab  ?Referring Provider [name]  ? ?  ? ? ?Encounter Date: 07/09/2021 ? ?Check In: ? Session Check In - 07/09/21 0804   ? ?  ? Check-In  ? Supervising physician immediately available to respond to emergencies See telemetry face sheet for immediately available ER MD   ? Location [facility]-Cardiac & Pulmonary Rehab   ? Staff Present Heath Lark, RN, BSN, CCRP;Joseph [place], RCP,RRT,BSRT;Kelly [place], [place], ACSM CEP, Exercise Physiologist   ? Virtual Visit No   ? Medication changes reported     No   ? Fall or balance concerns reported    No   ? Warm-up and Cool-down Performed on first and last piece of equipment   ? Resistance Training Performed Yes   ? VAD Patient? No   ? PAD/SET Patient? No   ?  ? Pain Assessment  ? Currently in Pain? No/denies   ? ?  ?  ? ?  ? ? ? ? ? ?Social History  ? ?Tobacco Use  ?Smoking Status Every Day  ? Packs/day: 1.00  ? Years: 45.00  ? Pack years: 45.00  ? Types: Cigarettes  ?Smokeless Tobacco Never  ?Tobacco Comments  ? 05/17/21- currently 0.5 PPD  ? ? ?Goals Met:  ?Independence with exercise equipment ?Exercise tolerated well ?No report of concerns or symptoms today ? ?Goals Unmet:  ?Not Applicable ? ?Comments: Pt able to follow exercise prescription today without complaint.  Will continue to monitor for progression. ? ? ? ?Dr. Emily Filbert is Medical Director for [place].  ?Dr. Ottie Glazier is Medical Director for [facility] Pulmonary Rehabilitation. ?

## 2021-07-16 ENCOUNTER — Encounter: Payer: Medicare HMO | Attending: Internal Medicine | Admitting: *Deleted

## 2021-07-16 DIAGNOSIS — Z48812 Encounter for surgical aftercare following surgery on the circulatory system: Secondary | ICD-10-CM | POA: Insufficient documentation

## 2021-07-16 DIAGNOSIS — Z955 Presence of coronary angioplasty implant and graft: Secondary | ICD-10-CM | POA: Insufficient documentation

## 2021-07-16 NOTE — Progress Notes (Signed)
Daily Session Note ? ?Patient Details  ?Name: Britten A Phil Dopp. ?MRN: [id number] ?Date of Birth: 12/16/1950 ?Referring Provider:   ?Flowsheet Row Cardiac Rehab from 05/24/2021 in [facility] Cardiac and Pulmonary Rehab  ?Referring Provider [name]  ? ?  ? ? ?Encounter Date: 07/16/2021 ? ?Check In: ? Session Check In - 07/16/21 0813   ? ?  ? Check-In  ? Supervising physician immediately available to respond to emergencies See telemetry face sheet for immediately available ER MD   ? Location [facility]-Cardiac & Pulmonary Rehab   ? Staff Present Heath Lark, RN, BSN, CCRP;Joseph [place], RCP,RRT,BSRT;Kelly [place], [place], ACSM CEP, Exercise Physiologist   ? Virtual Visit No   ? Medication changes reported     No   ? Fall or balance concerns reported    No   ? Warm-up and Cool-down Performed on first and last piece of equipment   ? Resistance Training Performed Yes   ? VAD Patient? No   ? PAD/SET Patient? No   ?  ? Pain Assessment  ? Currently in Pain? No/denies   ? ?  ?  ? ?  ? ? ? ? ? ?Social History  ? ?Tobacco Use  ?Smoking Status Every Day  ? Packs/day: 1.00  ? Years: 45.00  ? Pack years: 45.00  ? Types: Cigarettes  ?Smokeless Tobacco Never  ?Tobacco Comments  ? 05/17/21- currently 0.5 PPD  ? ? ?Goals Met:  ?Independence with exercise equipment ?Exercise tolerated well ?No report of concerns or symptoms today ? ?Goals Unmet:  ?Not Applicable ? ?Comments: Pt able to follow exercise prescription today without complaint.  Will continue to monitor for progression. ? ? ? ?Dr. Emily Filbert is Medical Director for [place].  ?Dr. Ottie Glazier is Medical Director for [facility] Pulmonary Rehabilitation. ?

## 2021-07-18 DIAGNOSIS — Z48812 Encounter for surgical aftercare following surgery on the circulatory system: Secondary | ICD-10-CM | POA: Diagnosis not present

## 2021-07-18 DIAGNOSIS — Z955 Presence of coronary angioplasty implant and graft: Secondary | ICD-10-CM | POA: Diagnosis not present

## 2021-07-18 NOTE — Progress Notes (Signed)
Daily Session Note ? ?Patient Details  ?Name: Gary Mccoy. ?MRN: [id number] ?Date of Birth: May 31, 1950 ?Referring Provider:   ?Flowsheet Row Cardiac Rehab from 05/24/2021 in [facility] Cardiac and Pulmonary Rehab  ?Referring Provider [name]  ? ?  ? ? ?Encounter Date: 07/18/2021 ? ?Check In: ? Session Check In - 07/18/21 0731   ? ?  ? Check-In  ? Supervising physician immediately available to respond to emergencies See telemetry face sheet for immediately available ER MD   ? Location [facility]-Cardiac & Pulmonary Rehab   ? Staff Present Birdie Sons, MPA, RN;Melissa Sutton, RDN, LDN;Joseph [place], RCP,RRT,BSRT   ? Virtual Visit No   ? Medication changes reported     No   ? Fall or balance concerns reported    No   ? Tobacco Cessation No Change   ? Warm-up and Cool-down Performed on first and last piece of equipment   ? Resistance Training Performed No   cooled down during this time  ? VAD Patient? No   ? PAD/SET Patient? No   ?  ? Pain Assessment  ? Currently in Pain? No/denies   ? ?  ?  ? ?  ? ? ? ? ? ?Social History  ? ?Tobacco Use  ?Smoking Status Every Day  ? Packs/day: 1.00  ? Years: 45.00  ? Pack years: 45.00  ? Types: Cigarettes  ?Smokeless Tobacco Never  ?Tobacco Comments  ? 05/17/21- currently 0.5 PPD  ? ? ?Goals Met:  ?Independence with exercise equipment ?Exercise tolerated well ?No report of concerns or symptoms today ? ?Goals Unmet:  ?Not Applicable ? ?Comments: Pt able to follow exercise prescription today without complaint.  Will continue to monitor for progression. ? ? ? ?Dr. Emily Filbert is Medical Director for [place].  ?Dr. Ottie Glazier is Medical Director for [facility] Pulmonary Rehabilitation. ?

## 2021-07-25 DIAGNOSIS — Z955 Presence of coronary angioplasty implant and graft: Secondary | ICD-10-CM

## 2021-07-25 DIAGNOSIS — Z48812 Encounter for surgical aftercare following surgery on the circulatory system: Secondary | ICD-10-CM | POA: Diagnosis not present

## 2021-07-25 NOTE — Progress Notes (Signed)
Daily Session Note ? ?Patient Details  ?Name: Gary Mccoy. ?MRN: [id number] ?Date of Birth: 1950-12-27 ?Referring Provider:   ?Flowsheet Row Cardiac Rehab from 05/24/2021 in [facility] Cardiac and Pulmonary Rehab  ?Referring Provider [name]  ? ?  ? ? ?Encounter Date: 07/25/2021 ? ?Check In: ? Session Check In - 07/25/21 0718   ? ?  ? Check-In  ? Supervising physician immediately available to respond to emergencies See telemetry face sheet for immediately available ER MD   ? Location [facility]-Cardiac & Pulmonary Rehab   ? Staff Present Birdie Sons, MPA, RN;Melissa [place], RDN, LDN;Jessica [place], MA, RCEP, CCRP, CCET   ? Virtual Visit No   ? Medication changes reported     No   ? Fall or balance concerns reported    No   ? Tobacco Cessation No Change   ? Warm-up and Cool-down Performed on first and last piece of equipment   ? Resistance Training Performed Yes   ? VAD Patient? No   ? PAD/SET Patient? No   ?  ? Pain Assessment  ? Currently in Pain? No/denies   ? ?  ?  ? ?  ? ? ? ? ? ?Social History  ? ?Tobacco Use  ?Smoking Status Every Day  ? Packs/day: 1.00  ? Years: 45.00  ? Pack years: 45.00  ? Types: Cigarettes  ?Smokeless Tobacco Never  ?Tobacco Comments  ? 05/17/21- currently 0.5 PPD  ? ? ?Goals Met:  ?Independence with exercise equipment ?Exercise tolerated well ?No report of concerns or symptoms today ?Strength training completed today ? ?Goals Unmet:  ?Not Applicable ? ?Comments: Pt able to follow exercise prescription today without complaint.  Will continue to monitor for progression. ? ? ? ?Dr. Emily Filbert is Medical Director for [place].  ?Dr. Ottie Glazier is Medical Director for [facility] Pulmonary Rehabilitation. ?

## 2021-07-27 ENCOUNTER — Encounter: Payer: Medicare HMO | Admitting: *Deleted

## 2021-07-27 DIAGNOSIS — Z955 Presence of coronary angioplasty implant and graft: Secondary | ICD-10-CM

## 2021-07-27 DIAGNOSIS — Z48812 Encounter for surgical aftercare following surgery on the circulatory system: Secondary | ICD-10-CM | POA: Diagnosis not present

## 2021-07-27 NOTE — Progress Notes (Signed)
Daily Session Note ? ?Patient Details  ?Name: Gary Mccoy. ?MRN: [id number] ?Date of Birth: 10/20/1950 ?Referring Provider:   ?Flowsheet Row Cardiac Rehab from 05/24/2021 in [facility] Cardiac and Pulmonary Rehab  ?Referring Provider [name]  ? ?  ? ? ?Encounter Date: 07/27/2021 ? ?Check In: ? Session Check In - 07/27/21 0805   ? ?  ? Check-In  ? Supervising physician immediately available to respond to emergencies See telemetry face sheet for immediately available ER MD   ? Location [facility]-Cardiac & Pulmonary Rehab   ? Staff Present Heath Lark, RN, BSN, CCRP;Laureen Owens [place], BS, RRT, CPFT;Joseph [place], [place]   ? Virtual Visit No   ? Medication changes reported     No   ? Fall or balance concerns reported    No   ? Warm-up and Cool-down Performed on first and last piece of equipment   ? Resistance Training Performed Yes   ? VAD Patient? No   ? PAD/SET Patient? No   ?  ? Pain Assessment  ? Currently in Pain? No/denies   ? ?  ?  ? ?  ? ? ? ? ? ?Social History  ? ?Tobacco Use  ?Smoking Status Every Day  ? Packs/day: 1.00  ? Years: 45.00  ? Pack years: 45.00  ? Types: Cigarettes  ?Smokeless Tobacco Never  ?Tobacco Comments  ? 05/17/21- currently 0.5 PPD  ? ? ?Goals Met:  ?Independence with exercise equipment ?Exercise tolerated well ?No report of concerns or symptoms today ? ?Goals Unmet:  ?Not Applicable ? ?Comments: Pt able to follow exercise prescription today without complaint.  Will continue to monitor for progression. ? ? ? ?Dr. Emily Filbert is Medical Director for [place].  ?Dr. Ottie Glazier is Medical Director for [facility] Pulmonary Rehabilitation. ?

## 2021-07-30 ENCOUNTER — Encounter: Payer: Medicare HMO | Admitting: *Deleted

## 2021-07-30 DIAGNOSIS — Z955 Presence of coronary angioplasty implant and graft: Secondary | ICD-10-CM | POA: Diagnosis not present

## 2021-07-30 DIAGNOSIS — Z48812 Encounter for surgical aftercare following surgery on the circulatory system: Secondary | ICD-10-CM | POA: Diagnosis not present

## 2021-07-30 NOTE — Progress Notes (Signed)
Daily Session Note ? ?Patient Details  ?Name: Gary Mccoy. ?MRN: [id number] ?Date of Birth: Feb 23, 1951 ?Referring Provider:   ?Flowsheet Row Cardiac Rehab from 05/24/2021 in [facility] Cardiac and Pulmonary Rehab  ?Referring Provider [name]  ? ?  ? ? ?Encounter Date: 07/30/2021 ? ?Check In: ? Session Check In - 07/30/21 0912   ? ?  ? Check-In  ? Supervising physician immediately available to respond to emergencies See telemetry face sheet for immediately available ER MD   ? Location [facility]-Cardiac & Pulmonary Rehab   ? Staff Present Heath Lark, RN, BSN, [place], BS, ACSM CEP, Exercise Physiologist;Joseph [place], [place]   ? Virtual Visit No   ? Medication changes reported     No   ? Fall or balance concerns reported    No   ? Warm-up and Cool-down Performed on first and last piece of equipment   ? Resistance Training Performed Yes   ? VAD Patient? No   ? PAD/SET Patient? No   ?  ? Pain Assessment  ? Currently in Pain? No/denies   ? ?  ?  ? ?  ? ? ? ? ? ?Social History  ? ?Tobacco Use  ?Smoking Status Every Day  ? Packs/day: 1.00  ? Years: 45.00  ? Pack years: 45.00  ? Types: Cigarettes  ?Smokeless Tobacco Never  ?Tobacco Comments  ? 05/17/21- currently 0.5 PPD  ? ? ?Goals Met:  ?Independence with exercise equipment ?Exercise tolerated well ?No report of concerns or symptoms today ? ?Goals Unmet:  ?Not Applicable ? ?Comments: Pt able to follow exercise prescription today without complaint.  Will continue to monitor for progression. ? ? ? ?Dr. Emily Filbert is Medical Director for [place].  ?Dr. Ottie Glazier is Medical Director for [facility] Pulmonary Rehabilitation. ?

## 2021-08-01 ENCOUNTER — Encounter: Payer: Self-pay | Admitting: *Deleted

## 2021-08-01 DIAGNOSIS — Z955 Presence of coronary angioplasty implant and graft: Secondary | ICD-10-CM

## 2021-08-01 DIAGNOSIS — Z48812 Encounter for surgical aftercare following surgery on the circulatory system: Secondary | ICD-10-CM | POA: Diagnosis not present

## 2021-08-01 NOTE — Progress Notes (Signed)
Cardiac Individual Treatment Plan ? ?Patient Details  ?Name: Gary Mccoy. ?MRN: [id number] ?Date of Birth: [birth year] ?Referring Provider:   ?Flowsheet Row Cardiac Rehab from 05/24/2021 in [facility] Cardiac and Pulmonary Rehab  ?Referring Provider Gary Mccoy  ? ?  ? ? ?Initial Encounter Date:  ?Flowsheet Row Cardiac Rehab from 05/24/2021 in [facility] Cardiac and Pulmonary Rehab  ?Date 05/24/21  ? ?  ? ? ?Visit Diagnosis: Status post coronary artery stent placement ? ?Patient's Home Medications on Admission: ? ?Current Outpatient Medications:  ?  albuterol (VENTOLIN HFA) 108 (90 Base) MCG/ACT inhaler, Inhale 2 puffs into the lungs every 6 (six) hours as needed for wheezing or shortness of breath., Disp: 8 g, Rfl: 2 ?  amLODipine (NORVASC) 10 MG tablet, Take 1 tablet (10 mg total) by mouth daily., Disp: 30 tablet, Rfl: 2 ?  aspirin EC 81 MG tablet, Take 81 mg by mouth daily. (Patient not taking: Reported on 05/17/2021), Disp: , Rfl:  ?  clopidogrel (PLAVIX) 75 MG tablet, Take 75 mg by mouth daily., Disp: , Rfl:  ?  Coenzyme Q10 (COQ10) 200 MG CAPS, Take 200 mg by mouth daily., Disp: , Rfl:  ?  furosemide (LASIX) 20 MG tablet, Take 2 tablets (40 mg total) by mouth daily as needed. for weight gain or swelling (Patient taking differently: Take 20 mg by mouth daily. Additional 20 ng if needed for weight gain or swelling), Disp: 30 tablet, Rfl: 2 ?  hydrALAZINE (APRESOLINE) 50 MG tablet, Take 1 tablet (50 mg total) by mouth every 8 (eight) hours., Disp: 90 tablet, Rfl: 2 ?  Iron-Vitamin C (VITRON-C) 65-125 MG TABS, Take 1 tablet by mouth daily., Disp: , Rfl:  ?  isosorbide mononitrate (IMDUR) 60 MG 24 hr tablet, Take 1 tablet (60 mg total) by mouth daily., Disp: 30 tablet, Rfl: 2 ?  metoprolol succinate (TOPROL-XL) 25 MG 24 hr tablet, Take 1 tablet (25 mg total) by mouth daily., Disp: 30 tablet, Rfl: 2 ?  Multiple Vitamins-Minerals (MULTIVITAMIN ADULT PO), Take 1 tablet by mouth daily. Men, Disp: , Rfl:  ?  nitroGLYCERIN (NITROSTAT)  0.4 MG SL tablet, Place under the tongue., Disp: , Rfl:  ?  Omega-3 Fatty Acids (FISH OIL PO), Take by mouth., Disp: , Rfl:  ?  pantoprazole (PROTONIX) 40 MG tablet, Take 40 mg by mouth daily., Disp: , Rfl:  ?  Rivaroxaban (XARELTO) 15 MG TABS tablet, Take 1 tablet (15 mg total) by mouth daily., Disp: 30 tablet, Rfl: 2 ?  rosuvastatin (CRESTOR) 40 MG tablet, Take 20 mg by mouth daily., Disp: , Rfl:  ?  vitamin B-12 (CYANOCOBALAMIN) 1000 MCG tablet, Take 1,000 mcg by mouth daily., Disp: , Rfl:  ? ?Past Medical History: ?Past Medical History:  ?Diagnosis Date  ? Arthritis   ? lower back  ? Back pain   ? 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   ? Complication of anesthesia   ? Violent with hallucinations after procedure 10/2015  ? DDD (degenerative disc disease), lumbar   ? Duodenal ulcer   ? Elevated lipids   ? GERD (gastroesophageal reflux disease)   ? Leg weakness, bilateral   ? s/p lumbar surgery  ? PAD (peripheral artery disease) ([place])   ? Polyradiculitis   ? Stroke [facility])   ? Rt lower leg  estimated 10 - 12 stokes in last 5 years  ? Wears dentures   ? full upper and lower  ? ? ?Tobacco Use: ?Social History  ? ?Tobacco Use  ?Smoking Status Every Day  ?  Packs/day: 1.00  ? Years: 45.00  ? Pack years: 45.00  ? Types: Cigarettes  ?Smokeless Tobacco Never  ?Tobacco Comments  ? 05/17/21- currently 0.5 PPD  ? ? ?Labs: ?Review Flowsheet   ? ?  ?  Latest Ref Rng & Units 09/19/2015 09/20/2015 02/26/2018 01/29/2021  ?Labs for ITP Cardiac and Pulmonary Rehab  ?Cholestrol 0 - 200 mg/dL  175   160     ?LDL (calc) 0 - 99 mg/dL  116   102     ?HDL-C >40 mg/dL  39   35     ?Trlycerides <150 mg/dL  98   117     ?Hemoglobin A1c 4.8 - 5.6 % 6.1     5.2    ? ?  01/31/2021  ?Labs for ITP Cardiac and Pulmonary Rehab  ?Cholestrol 129    ?LDL (calc) 73    ?HDL-C 38    ?Trlycerides 91    ?Hemoglobin A1c   ?  ?  ?  ? ? ? ?Exercise Target Goals: ?Exercise Program Goal: ?Individual exercise prescription set using results from initial 6 min walk test and THRR while  considering  patient?s activity barriers and safety.  ? ?Exercise Prescription Goal: ?Initial exercise prescription builds to 30-45 minutes a day of aerobic activity, 2-3 days per week.  Home exercise guidelines will be given to patient during program as part of exercise prescription that the participant will acknowledge. ? ? ?Education: Aerobic Exercise: ?- Group verbal and visual presentation on the components of exercise prescription. Introduces F.I.T.T principle from ACSM for exercise prescriptions.  Reviews F.I.T.T. principles of aerobic exercise including progression. Written material given at graduation. ?Flowsheet Row Cardiac Rehab from 08/01/2021 in [facility] Cardiac and Pulmonary Rehab  ?Date 07/18/21  ?Educator [facility]  ?Instruction Review Code 1- Verbalizes Understanding  ? ?  ? ? ?Education: Resistance Exercise: ?- Group verbal and visual presentation on the components of exercise prescription. Introduces F.I.T.T principle from ACSM for exercise prescriptions  Reviews F.I.T.T. principles of resistance exercise including progression. Written material given at graduation. ?Flowsheet Row Cardiac Rehab from 08/01/2021 in [facility] Cardiac and Pulmonary Rehab  ?Date 07/25/21  ?Educator [facility]  ?Instruction Review Code 1- Verbalizes Understanding  ? ?  ? ?  ?Education: Exercise & Equipment Safety: ?- Individual verbal instruction and demonstration of equipment use and safety with use of the equipment. ?Flowsheet Row Cardiac Rehab from 08/01/2021 in [facility] Cardiac and Pulmonary Rehab  ?Date 05/24/21  ?Educator AS  ?Instruction Review Code 1- Verbalizes Understanding  ? ?  ? ? ?Education: Exercise Physiology & General Exercise Guidelines: ?- Group verbal and written instruction with models to review the exercise physiology of the cardiovascular system and associated critical values. Provides general exercise guidelines with specific guidelines to those with heart or lung disease.  ? ? ?Education: Flexibility, Balance, Mind/Body  Relaxation: ?- Group verbal and visual presentation with interactive activity on the components of exercise prescription. Introduces F.I.T.T principle from ACSM for exercise prescriptions. Reviews F.I.T.T. principles of flexibility and balance exercise training including progression. Also discusses the mind body connection.  Reviews various relaxation techniques to help reduce and manage stress (i.e. Deep breathing, progressive muscle relaxation, and visualization). Balance handout provided to take home. Written material given at graduation. ?Flowsheet Row Cardiac Rehab from 08/01/2021 in [facility] Cardiac and Pulmonary Rehab  ?Date 06/06/21  ?Educator AS  ?Instruction Review Code 1- Verbalizes Understanding  ? ?  ? ? ?Activity Barriers & Risk Stratification: ? ? ?6 Minute Walk: ? 6  Minute Walk   ? ? [place] Name 05/24/21 1619  ?  ?  ?  ? 6 Minute Walk  ? Phase Initial    ? Distance 1110 feet    ? Walk Time 6 minutes    ? # of Rest Breaks 0    ? MPH 2.1    ? METS 3.5    ? RPE 13    ? Perceived Dyspnea  2    ? VO2 Peak 12.25    ? Symptoms Yes (comment)    ? Comments SOB    ? Resting HR 62 bpm    ? Resting BP 144/64    ? Resting Oxygen Saturation  95 %    ? Exercise Oxygen Saturation  during 6 min walk 97 %    ? Max Ex. HR 107 bpm    ? Max Ex. BP 194/68    ? 2 Minute Post BP 148/64    ? ?  ?  ? ?  ? ? ?Oxygen Initial Assessment: ? ? ?Oxygen Re-Evaluation: ? ? ?Oxygen Discharge (Final Oxygen Re-Evaluation): ? ? ?Initial Exercise Prescription: ? Initial Exercise Prescription - 05/24/21 1600   ? ?  ? Date of Initial Exercise RX and Referring Provider  ? Date 05/24/21   ? Referring Provider Gary Mccoy   ?  ? Oxygen  ? Maintain Oxygen Saturation 88% or higher   ?  ? Treadmill  ? MPH 2.1   ? Grade 1.5   ? Minutes 15   ? METs 3   ?  ? Recumbant Bike  ? Level 2   ? RPM 60   ? Watts 40   ? Minutes 15   ? METs 3.5   ?  ? NuStep  ? Level 3   ? SPM 80   ? Minutes 15   ? METs 3.5   ?  ? Track  ? Laps 30   ? Minutes 2.1   ? METs 2.6   ?  ?  Prescription Details  ? Frequency (times per week) 3   ? Duration Progress to 30 minutes of continuous aerobic without signs/symptoms of physical distress   ?  ? Intensity  ? THRR 40-80% of Max Heartrate 97-133

## 2021-08-01 NOTE — Progress Notes (Signed)
Daily Session Note ? ?Patient Details  ?Name: Gary Mccoy. ?MRN: [id number] ?Date of Birth: 06-20-1950 ?Referring Provider:   ?Flowsheet Row Cardiac Rehab from 05/24/2021 in [facility] Cardiac and Pulmonary Rehab  ?Referring Provider [name]  ? ?  ? ? ?Encounter Date: 08/01/2021 ? ?Check In: ? Session Check In - 08/01/21 0715   ? ?  ? Check-In  ? Supervising physician immediately available to respond to emergencies See telemetry face sheet for immediately available ER MD   ? Location [facility]-Cardiac & Pulmonary Rehab   ? Staff Present Birdie Sons, MPA, RN;Melissa [place], RDN, LDN;Joseph [place], RCP,RRT,BSRT   ? Virtual Visit No   ? Medication changes reported     No   ? Fall or balance concerns reported    No   ? Tobacco Cessation No Change   ? Warm-up and Cool-down Performed on first and last piece of equipment   ? Resistance Training Performed Yes   ? VAD Patient? No   ? PAD/SET Patient? No   ?  ? Pain Assessment  ? Currently in Pain? No/denies   ? ?  ?  ? ?  ? ? ? ? ? ?Social History  ? ?Tobacco Use  ?Smoking Status Every Day  ? Packs/day: 1.00  ? Years: 45.00  ? Pack years: 45.00  ? Types: Cigarettes  ?Smokeless Tobacco Never  ?Tobacco Comments  ? 05/17/21- currently 0.5 PPD  ? ? ?Goals Met:  ?Independence with exercise equipment ?Exercise tolerated well ?No report of concerns or symptoms today ?Strength training completed today ? ?Goals Unmet:  ?Not Applicable ? ?Comments: Pt able to follow exercise prescription today without complaint.  Will continue to monitor for progression. ? ? ? ? ?Dr. Emily Filbert is Medical Director for [place].  ?Dr. Ottie Glazier is Medical Director for [facility] Pulmonary Rehabilitation. ?

## 2021-08-03 ENCOUNTER — Encounter: Payer: Medicare HMO | Admitting: *Deleted

## 2021-08-03 VITALS — Ht 73.0 in | Wt 187.0 lb

## 2021-08-03 DIAGNOSIS — Z955 Presence of coronary angioplasty implant and graft: Secondary | ICD-10-CM

## 2021-08-03 DIAGNOSIS — Z48812 Encounter for surgical aftercare following surgery on the circulatory system: Secondary | ICD-10-CM | POA: Diagnosis not present

## 2021-08-03 NOTE — Progress Notes (Signed)
Daily Session Note  Patient Details  Name: Gary Mccoy. MRN: [id number] Date of Birth: 04-26-1950 Referring Provider:   Flowsheet Row Cardiac Rehab from 05/24/2021 in [facility] Cardiac and Pulmonary Rehab  Referring Provider [name]       Encounter Date: 08/03/2021  Check In:  Session Check In - 08/03/21 0818       Check-In   Supervising physician immediately available to respond to emergencies See telemetry face sheet for immediately available ER MD    Location [facility]-Cardiac & Pulmonary Rehab    Staff Present Heath Lark, RN, BSN, CCRP;Jessica Carter, MA, RCEP, CCRP, CCET;Joseph [place], [place]    Virtual Visit No    Medication changes reported     No    Fall or balance concerns reported    No    Warm-up and Cool-down Performed on first and last piece of equipment    Resistance Training Performed Yes    VAD Patient? No    PAD/SET Patient? No      Pain Assessment   Currently in Pain? No/denies                Social History   Tobacco Use  Smoking Status Every Day   Packs/day: 1.00   Years: 45.00   Pack years: 45.00   Types: Cigarettes  Smokeless Tobacco Never  Tobacco Comments   05/17/21- currently 0.5 PPD    Goals Met:  Independence with exercise equipment Exercise tolerated well No report of concerns or symptoms today  Goals Unmet:  Not Applicable  Comments: Pt able to follow exercise prescription today without complaint.  Will continue to monitor for progression.    Dr. Emily Filbert is Medical Director for [place].  Dr. Ottie Glazier is Medical Director for [facility] Pulmonary Rehabilitation.

## 2021-08-06 ENCOUNTER — Encounter: Payer: Medicare HMO | Admitting: *Deleted

## 2021-08-06 DIAGNOSIS — Z48812 Encounter for surgical aftercare following surgery on the circulatory system: Secondary | ICD-10-CM | POA: Diagnosis not present

## 2021-08-06 DIAGNOSIS — Z955 Presence of coronary angioplasty implant and graft: Secondary | ICD-10-CM | POA: Diagnosis not present

## 2021-08-06 NOTE — Progress Notes (Signed)
Daily Session Note  Patient Details  Name: Gary Mccoy. MRN: [id number] Date of Birth: 1951/03/12 Referring Provider:   Flowsheet Row Cardiac Rehab from 05/24/2021 in [facility] Cardiac and Pulmonary Rehab  Referring Provider [name]       Encounter Date: 08/06/2021  Check In:  Session Check In - 08/06/21 0926       Check-In   Supervising physician immediately available to respond to emergencies See telemetry face sheet for immediately available ER MD    Location [facility]-Cardiac & Pulmonary Rehab    Staff Present Heath Lark, RN, BSN, Jacklynn Bue, MS, ASCM CEP, Exercise Physiologist;Amanda Oletta Darter, [place], ACSM CEP, Exercise Physiologist    Virtual Visit No    Medication changes reported     No    Fall or balance concerns reported    No    Warm-up and Cool-down Performed on first and last piece of equipment    Resistance Training Performed Yes    VAD Patient? No    PAD/SET Patient? No      Pain Assessment   Currently in Pain? No/denies                Social History   Tobacco Use  Smoking Status Every Day   Packs/day: 1.00   Years: 45.00   Pack years: 45.00   Types: Cigarettes  Smokeless Tobacco Never  Tobacco Comments   05/17/21- currently 0.5 PPD    Goals Met:  Independence with exercise equipment Exercise tolerated well No report of concerns or symptoms today  Goals Unmet:  Not Applicable  Comments: Pt able to follow exercise prescription today without complaint.  Will continue to monitor for progression.    Dr. Emily Filbert is Medical Director for [place].  Dr. Ottie Glazier is Medical Director for [facility] Pulmonary Rehabilitation.

## 2021-08-08 DIAGNOSIS — Z48812 Encounter for surgical aftercare following surgery on the circulatory system: Secondary | ICD-10-CM | POA: Diagnosis not present

## 2021-08-08 DIAGNOSIS — Z955 Presence of coronary angioplasty implant and graft: Secondary | ICD-10-CM | POA: Diagnosis not present

## 2021-08-08 NOTE — Progress Notes (Signed)
Daily Session Note  Patient Details  Name: Gary Mccoy. MRN: [id number] Date of Birth: 08-04-1950 Referring Provider:   Flowsheet Row Cardiac Rehab from 05/24/2021 in [facility] Cardiac and Pulmonary Rehab  Referring Provider [name]       Encounter Date: 08/08/2021  Check In:  Session Check In - 08/08/21 0709       Check-In   Supervising physician immediately available to respond to emergencies See telemetry face sheet for immediately available ER MD    Location [facility]-Cardiac & Pulmonary Rehab    Staff Present Birdie Sons, MPA, RN;Joseph [place], [place], MA, RCEP, CCRP, CCET    Virtual Visit No    Medication changes reported     No    Fall or balance concerns reported    No    Tobacco Cessation No Change    Warm-up and Cool-down Performed on first and last piece of equipment    Resistance Training Performed Yes    VAD Patient? No    PAD/SET Patient? No      Pain Assessment   Currently in Pain? No/denies                Social History   Tobacco Use  Smoking Status Every Day   Packs/day: 1.00   Years: 45.00   Pack years: 45.00   Types: Cigarettes  Smokeless Tobacco Never  Tobacco Comments   05/17/21- currently 0.5 PPD    Goals Met:  Independence with exercise equipment Exercise tolerated well No report of concerns or symptoms today Strength training completed today  Goals Unmet:  Not Applicable  Comments: Pt able to follow exercise prescription today without complaint.  Will continue to monitor for progression.    Dr. Emily Filbert is Medical Director for [place].  Dr. Ottie Glazier is Medical Director for [facility] Pulmonary Rehabilitation.

## 2021-08-09 NOTE — Patient Instructions (Signed)
Discharge Patient Instructions  Patient Details  Name: Gary Mccoy. MRN: [id number] Date of Birth: Mar 03, 1951 Referring Provider:  Rusty Aus, MD   Number of Visits: 36  Reason for Discharge:  Patient reached a stable level of exercise. Patient independent in their exercise. Patient has met program and personal goals.  Smoking History:  Social History   Tobacco Use  Smoking Status Every Day   Packs/day: 1.00   Years: 45.00   Pack years: 45.00   Types: Cigarettes  Smokeless Tobacco Never  Tobacco Comments   05/17/21- currently 0.5 PPD    Diagnosis:  Status post coronary artery stent placement  Initial Exercise Prescription:  Initial Exercise Prescription - 05/24/21 1600       Date of Initial Exercise RX and Referring Provider   Date 05/24/21    Referring Provider [name]      Oxygen   Maintain Oxygen Saturation 88% or higher      Treadmill   MPH 2.1    Grade 1.5    Minutes 15    METs 3      Recumbant Bike   Level 2    RPM 60    Watts 40    Minutes 15    METs 3.5      NuStep   Level 3    SPM 80    Minutes 15    METs 3.5      Track   Laps 30    Minutes 2.1    METs 2.6      Prescription Details   Frequency (times per week) 3    Duration Progress to 30 minutes of continuous aerobic without signs/symptoms of physical distress      Intensity   THRR 40-80% of Max Heartrate 97-133    Ratings of Perceived Exertion 11-13    Perceived Dyspnea 0-4      Resistance Training   Training Prescription Yes    Weight 4 lb    Reps 10-15             Discharge Exercise Prescription (Final Exercise Prescription Changes):  Exercise Prescription Changes - 08/06/21 0900       Response to Exercise   Blood Pressure (Admit) 120/60    Blood Pressure (Exit) 100/56    Heart Rate (Admit) 66 bpm    Heart Rate (Exercise) 107 bpm    Heart Rate (Exit) 67 bpm    Rating of Perceived Exertion (Exercise) 13    Symptoms none    Duration Continue with 30  min of aerobic exercise without signs/symptoms of physical distress.    Intensity THRR unchanged      Progression   Progression Continue to progress workloads to maintain intensity without signs/symptoms of physical distress.    Average METs 3.07      Resistance Training   Training Prescription Yes    Weight 8 lb    Reps 10-15      Interval Training   Interval Training No      NuStep   Level 6    Minutes 30    METs 3      Home Exercise Plan   Plans to continue exercise at Home (comment)   walking   Frequency Add 2 additional days to program exercise sessions.    Initial Home Exercises Provided 07/04/21      Oxygen   Maintain Oxygen Saturation 88% or higher             Functional Capacity:  [place] Name 05/24/21 1619 08/03/21 0740       6 Minute Walk   Phase Initial Discharge    Distance 1110 feet 1220 feet    Distance % Change -- 9.9 %    Distance Feet Change -- 110 ft    Walk Time 6 minutes 6 minutes    # of Rest Breaks 0 0    MPH 2.1 2.31    METS 3.5 3.38    RPE 13 13    Perceived Dyspnea  2 --    VO2 Peak 12.25 11.84    Symptoms Yes (comment) Yes (comment)    Comments SOB hip and back pain 7/10    Resting HR 62 bpm 66 bpm    Resting BP 144/64 102/62    Resting Oxygen Saturation  95 % 97 %    Exercise Oxygen Saturation  during 6 min walk 97 % 95 %    Max Ex. HR 107 bpm 107 bpm    Max Ex. BP 194/68 158/64    2 Minute Post BP 148/64 --             Nutrition & Weight - Outcomes:  Pre Biometrics - 05/24/21 1625       Pre Biometrics   Height _0  (1.854 m)    Weight 184 lb 8 oz (83.7 kg)    BMI (Calculated) 24.35    Single Leg Stand 13.79 seconds             Post Biometrics - 08/03/21 0741        Post  Biometrics   Height _1  (1.854 m)    Weight 187 lb (84.8 kg)    BMI (Calculated) 24.68    Single Leg Stand 30 seconds             Nutrition:  Nutrition Therapy & Goals - 06/04/21 0818       Nutrition  Therapy   Diet Heart healthy, low Na    Drug/Food Interactions Statins/Certain Fruits    Protein (specify units) 65g    Fiber 30 grams    Whole Grain Foods 3 servings    Saturated Fats 16 max. grams    Fruits and Vegetables 8 servings/day    Sodium 2 grams      Personal Nutrition Goals   Nutrition Goal ST: review paperwork, consider trying out some flavor or hidden vegetable techniques, continue with changes made LT: maintain changes made, eat at least 5 servings of fruits/vegetables per day (3 vegetables/2 fruit)    Comments 71 y.o. M admitted to cardiac rehab s/p stent placement. PMHx includes CKD stg 3, HTN, HLD, CHF, COPD, CAD, PAD, peptic ulcer disease. Relevant medications include CoQ10, B-12, iron-vit C, lasix, MVI, nicotine, fish oil, protonix, crestor. PYP Score: 69. Vegetables & Fruits 6/12. Breads, Grains & Cereals 7/12. Red & Processed Meat 8/12. Poultry 2/2. Fish & Shellfish 0/4. Beans, Nuts & Seeds 2/4. Milk & Dairy Foods 5/6. Toppings, Oils, Seasonings & Salt 16/20. Sweets, Snacks & Restaurant Food 13/14. Beverages 10/10. He has tried to include mnore whole grains, reduce his salt, he has reduced his meat and potatoes, he increased his fruit intake. He eats green peas, lettuce, green beans, carrots, but he does not care much for many vegetables. Grits or rasin bran, frosted flakes with pineapple or fruit cocktail. L: sandwich ([place], chicken, or sometimes ham with mustard or small amount pimento cheese) D: wife will cook: lean meat,  beans, baked potato with some butter, no added salt. He will add Mrs Deliah [place] and onions for added seasonings. Fruit (mostly pineapple or sometimes stewed apples. Drinks: coffee decaf with some sugar, water, iced tea (stevia). He does not have a big sweet tooth. He has an issue with satisfaction with food - he said it was explained to him as a "non-taster", he feels the lack of salt is exacerbating this issue. He used to eat the vegetables he likes with salt, but  now he just forces them down. Discussed flavor building and hidden vegetable techniques. Reviewed heart healthy diet.      Intervention Plan   Intervention Prescribe, educate and counsel regarding individualized specific dietary modifications aiming towards targeted core components such as weight, hypertension, lipid management, diabetes, heart failure and other comorbidities.;Nutrition handout(s) given to patient.    Expected Outcomes Short Term Goal: Understand basic principles of dietary content, such as calories, fat, sodium, cholesterol and nutrients.;Short Term Goal: A plan has been developed with personal nutrition goals set during dietitian appointment.;Long Term Goal: Adherence to prescribed nutrition plan.              Goals reviewed with patient; copy given to patient.

## 2021-08-10 ENCOUNTER — Encounter: Payer: Medicare HMO | Admitting: *Deleted

## 2021-08-10 DIAGNOSIS — Z955 Presence of coronary angioplasty implant and graft: Secondary | ICD-10-CM | POA: Diagnosis not present

## 2021-08-10 DIAGNOSIS — Z48812 Encounter for surgical aftercare following surgery on the circulatory system: Secondary | ICD-10-CM | POA: Diagnosis not present

## 2021-08-10 NOTE — Progress Notes (Signed)
Daily Session Note  Patient Details  Name: Gary Mccoy. MRN: [id number] Date of Birth: 1950/06/10 Referring Provider:   Flowsheet Row Cardiac Rehab from 05/24/2021 in [facility] Cardiac and Pulmonary Rehab  Referring Provider [name]       Encounter Date: 08/10/2021  Check In:  Session Check In - 08/10/21 0805       Check-In   Supervising physician immediately available to respond to emergencies See telemetry face sheet for immediately available ER MD    Location [facility]-Cardiac & Pulmonary Rehab    Staff Present Heath Lark, RN, BSN, CCRP;Jessica [place], MA, RCEP, CCRP, CCET;Joseph [place], [place]    Virtual Visit No    Medication changes reported     No    Fall or balance concerns reported    No    Warm-up and Cool-down Performed on first and last piece of equipment    Resistance Training Performed Yes    VAD Patient? No    PAD/SET Patient? No      Pain Assessment   Currently in Pain? No/denies                Social History   Tobacco Use  Smoking Status Every Day   Packs/day: 1.00   Years: 45.00   Pack years: 45.00   Types: Cigarettes  Smokeless Tobacco Never  Tobacco Comments   05/17/21- currently 0.5 PPD    Goals Met:  Independence with exercise equipment Exercise tolerated well No report of concerns or symptoms today  Goals Unmet:  Not Applicable  Comments: Pt able to follow exercise prescription today without complaint.  Will continue to monitor for progression.    Dr. Emily Filbert is Medical Director for [place].  Dr. Ottie Glazier is Medical Director for [facility] Pulmonary Rehabilitation.

## 2021-08-15 DIAGNOSIS — Z48812 Encounter for surgical aftercare following surgery on the circulatory system: Secondary | ICD-10-CM | POA: Diagnosis not present

## 2021-08-15 DIAGNOSIS — Z955 Presence of coronary angioplasty implant and graft: Secondary | ICD-10-CM | POA: Diagnosis not present

## 2021-08-15 NOTE — Progress Notes (Signed)
Daily Session Note  Patient Details  Name: Gary Mccoy. MRN: [id number] Date of Birth: 12-10-1950 Referring Provider:   Flowsheet Row Cardiac Rehab from 05/24/2021 in [facility] Cardiac and Pulmonary Rehab  Referring Provider [name]       Encounter Date: 08/15/2021  Check In:  Session Check In - 08/15/21 0720       Check-In   Supervising physician immediately available to respond to emergencies See telemetry face sheet for immediately available ER MD    Location [facility]-Cardiac & Pulmonary Rehab    Staff Present Birdie Sons, MPA, RN;Jessica Timken, MA, RCEP, CCRP, CCET;Joseph [place], [place]    Virtual Visit No    Medication changes reported     No    Fall or balance concerns reported    No    Tobacco Cessation No Change    Warm-up and Cool-down Performed on first and last piece of equipment    Resistance Training Performed Yes    VAD Patient? No    PAD/SET Patient? No      Pain Assessment   Currently in Pain? No/denies                Social History   Tobacco Use  Smoking Status Every Day   Packs/day: 1.00   Years: 45.00   Pack years: 45.00   Types: Cigarettes  Smokeless Tobacco Never  Tobacco Comments   05/17/21- currently 0.5 PPD    Goals Met:  Independence with exercise equipment Exercise tolerated well No report of concerns or symptoms today Strength training completed today  Goals Unmet:  Not Applicable  Comments: Pt able to follow exercise prescription today without complaint.  Will continue to monitor for progression.    Dr. Emily Filbert is Medical Director for [facility].  Dr. Ottie Glazier is Medical Director for [facility] Pulmonary Rehabilitation.

## 2021-08-17 ENCOUNTER — Encounter: Payer: Medicare HMO | Attending: Internal Medicine | Admitting: *Deleted

## 2021-08-17 DIAGNOSIS — Z955 Presence of coronary angioplasty implant and graft: Secondary | ICD-10-CM | POA: Insufficient documentation

## 2021-08-17 NOTE — Progress Notes (Signed)
Discharge Note  Gary Mccoy graduated today from  rehab with 36 sessions completed.  Details of the patient's exercise prescription and what He needs to do in order to continue the prescription and progress were discussed with patient.  Patient was given a copy of prescription and goals.  Patient verbalized understanding.  [facility]   [place] Name 05/24/21 1619 08/03/21 0740       6 Minute Walk   Phase Initial Discharge    Distance 1110 feet 1220 feet    Distance % Change -- 9.9 %    Distance Feet Change -- 110 ft    Walk Time 6 minutes 6 minutes    # of Rest Breaks 0 0    MPH 2.1 2.31    METS 3.5 3.38    RPE 13 13    Perceived Dyspnea  2 --    VO2 Peak 12.25 11.84    Symptoms Yes (comment) Yes (comment)    Comments SOB hip and back pain 7/10    Resting HR 62 bpm 66 bpm    Resting BP 144/64 102/62    Resting Oxygen Saturation  95 % 97 %    Exercise Oxygen Saturation  during 6 min walk 97 % 95 %    Max Ex. HR 107 bpm 107 bpm    Max Ex. BP 194/68 158/64    2 Minute Post BP 148/64 --            Thank you for the referral. We enjoyed working with [employer].

## 2021-08-17 NOTE — Progress Notes (Signed)
Daily Session Note  Patient Details  Name: Gary Mccoy. MRN: [id number] Date of Birth: 09-03-1950 Referring Provider:   Flowsheet Row Cardiac Rehab from 05/24/2021 in [facility] Cardiac and Pulmonary Rehab  Referring Provider [name]       Encounter Date: 08/17/2021  Check In:  Session Check In - 08/17/21 0806       Check-In   Supervising physician immediately available to respond to emergencies See telemetry face sheet for immediately available ER MD    Location [facility]-Cardiac & Pulmonary Rehab    Staff Present Heath Lark, RN, BSN, CCRP;Joseph [place], RCP,RRT,BSRT;Jessica Ashley, [place], [place], CCRP, CCET    Virtual Visit No    Medication changes reported     No    Fall or balance concerns reported    No    Warm-up and Cool-down Performed on first and last piece of equipment    Resistance Training Performed Yes    VAD Patient? No    PAD/SET Patient? No      Pain Assessment   Currently in Pain? No/denies                Social History   Tobacco Use  Smoking Status Every Day   Packs/day: 1.00   Years: 45.00   Pack years: 45.00   Types: Cigarettes  Smokeless Tobacco Never  Tobacco Comments   05/17/21- currently 0.5 PPD    Goals Met:  Independence with exercise equipment Exercise tolerated well Personal goals reviewed No report of concerns or symptoms today  Goals Unmet:  Not Applicable  Comments:  [place] graduated today from  rehab with 36 sessions completed.  Details of the patient's exercise prescription and what He needs to do in order to continue the prescription and progress were discussed with patient.  Patient was given a copy of prescription and goals.  Patient verbalized understanding.  [place] [facility]    Dr. Emily Filbert is Medical Director for [facility].  Dr. Ottie Glazier is Medical Director for [facility] Pulmonary Rehabilitation.

## 2021-08-17 NOTE — Progress Notes (Signed)
Cardiac Individual Treatment Plan  Patient Details  Name: Gary Mccoy. MRN: [id number] Date of Birth: [birth year] Referring Provider:   Flowsheet Row Cardiac Rehab from 05/24/2021 in [facility] Cardiac and Pulmonary Rehab  Referring Provider [name]       Initial Encounter Date:  Flowsheet Row Cardiac Rehab from 05/24/2021 in [facility] Cardiac and Pulmonary Rehab  Date 05/24/21       Visit Diagnosis: Status post coronary artery stent placement  Patient's Home Medications on Admission:  Current Outpatient Medications:    albuterol (VENTOLIN HFA) 108 (90 Base) MCG/ACT inhaler, Inhale 2 puffs into the lungs every 6 (six) hours as needed for wheezing or shortness of breath., Disp: 8 g, Rfl: 2   amLODipine (NORVASC) 10 MG tablet, Take 1 tablet (10 mg total) by mouth daily., Disp: 30 tablet, Rfl: 2   aspirin EC 81 MG tablet, Take 81 mg by mouth daily. (Patient not taking: Reported on 05/17/2021), Disp: , Rfl:    clopidogrel (PLAVIX) 75 MG tablet, Take 75 mg by mouth daily., Disp: , Rfl:    Coenzyme Q10 (COQ10) 200 MG CAPS, Take 200 mg by mouth daily., Disp: , Rfl:    furosemide (LASIX) 20 MG tablet, Take 2 tablets (40 mg total) by mouth daily as needed. for weight gain or swelling (Patient taking differently: Take 20 mg by mouth daily. Additional 20 ng if needed for weight gain or swelling), Disp: 30 tablet, Rfl: 2   hydrALAZINE (APRESOLINE) 50 MG tablet, Take 1 tablet (50 mg total) by mouth every 8 (eight) hours., Disp: 90 tablet, Rfl: 2   Iron-Vitamin C (VITRON-C) 65-125 MG TABS, Take 1 tablet by mouth daily., Disp: , Rfl:    isosorbide mononitrate (IMDUR) 60 MG 24 hr tablet, Take 1 tablet (60 mg total) by mouth daily., Disp: 30 tablet, Rfl: 2   metoprolol succinate (TOPROL-XL) 25 MG 24 hr tablet, Take 1 tablet (25 mg total) by mouth daily., Disp: 30 tablet, Rfl: 2   Multiple Vitamins-Minerals (MULTIVITAMIN ADULT PO), Take 1 tablet by mouth daily. Men, Disp: , Rfl:    nitroGLYCERIN (NITROSTAT)  0.4 MG SL tablet, Place under the tongue., Disp: , Rfl:    Omega-3 Fatty Acids (FISH OIL PO), Take by mouth., Disp: , Rfl:    pantoprazole (PROTONIX) 40 MG tablet, Take 40 mg by mouth daily., Disp: , Rfl:    Rivaroxaban (XARELTO) 15 MG TABS tablet, Take 1 tablet (15 mg total) by mouth daily., Disp: 30 tablet, Rfl: 2   rosuvastatin (CRESTOR) 40 MG tablet, Take 20 mg by mouth daily., Disp: , Rfl:    vitamin B-12 (CYANOCOBALAMIN) 1000 MCG tablet, Take 1,000 mcg by mouth daily., Disp: , Rfl:   Past Medical History: Past Medical History:  Diagnosis Date   Arthritis    lower back   Back pain    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    Complication of anesthesia    Violent with hallucinations after procedure 10/2015   DDD (degenerative disc disease), lumbar    Duodenal ulcer    Elevated lipids    GERD (gastroesophageal reflux disease)    Leg weakness, bilateral    s/p lumbar surgery   PAD (peripheral artery disease) ([place])    Polyradiculitis    Stroke ([place])    Rt lower leg  estimated 10 - 12 stokes in last 5 years   Wears dentures    full upper and lower    Tobacco Use: Social History   Tobacco Use  Smoking Status Every Day  Packs/day: 1.00   Years: 45.00   Pack years: 45.00   Types: Cigarettes  Smokeless Tobacco Never  Tobacco Comments   05/17/21- currently 0.5 PPD    Labs: Review Flowsheet        Latest Ref Rng & Units 09/19/2015 09/20/2015 02/26/2018 01/29/2021  Labs for ITP Cardiac and Pulmonary Rehab  Cholestrol 0 - 200 mg/dL  175   160     LDL (calc) 0 - 99 mg/dL  116   102     HDL-C >40 mg/dL  39   35     Trlycerides <150 mg/dL  98   117     Hemoglobin A1c 4.8 - 5.6 % 6.1     5.2       01/31/2021  Labs for ITP Cardiac and Pulmonary Rehab  Cholestrol 129    LDL (calc) 73    HDL-C 38    Trlycerides 91    Hemoglobin A1c            Exercise Target Goals: Exercise Program Goal: Individual exercise prescription set using results from initial 6 min walk test and THRR while  considering  patient's activity barriers and safety.   Exercise Prescription Goal: Initial exercise prescription builds to 30-45 minutes a day of aerobic activity, 2-3 days per week.  Home exercise guidelines will be given to patient during program as part of exercise prescription that the participant will acknowledge.   Education: Aerobic Exercise: - Group verbal and visual presentation on the components of exercise prescription. Introduces F.I.T.T principle from ACSM for exercise prescriptions.  Reviews F.I.T.T. principles of aerobic exercise including progression. Written material given at graduation. Flowsheet Row Cardiac Rehab from 08/15/2021 in [facility] Cardiac and Pulmonary Rehab  Date 07/18/21  Educator [facility]  Instruction Review Code 1- Verbalizes Understanding       Education: Resistance Exercise: - Group verbal and visual presentation on the components of exercise prescription. Introduces F.I.T.T principle from ACSM for exercise prescriptions  Reviews F.I.T.T. principles of resistance exercise including progression. Written material given at graduation. Flowsheet Row Cardiac Rehab from 08/15/2021 in [facility] Cardiac and Pulmonary Rehab  Date 07/25/21  Educator [facility]  Instruction Review Code 1- Verbalizes Understanding        Education: Exercise & Equipment Safety: - Individual verbal instruction and demonstration of equipment use and safety with use of the equipment. Flowsheet Row Cardiac Rehab from 08/15/2021 in [facility] Cardiac and Pulmonary Rehab  Date 05/24/21  Educator AS  Instruction Review Code 1- Verbalizes Understanding       Education: Exercise Physiology & General Exercise Guidelines: - Group verbal and written instruction with models to review the exercise physiology of the cardiovascular system and associated critical values. Provides general exercise guidelines with specific guidelines to those with heart or lung disease.    Education: Flexibility, Balance, Mind/Body  Relaxation: - Group verbal and visual presentation with interactive activity on the components of exercise prescription. Introduces F.I.T.T principle from ACSM for exercise prescriptions. Reviews F.I.T.T. principles of flexibility and balance exercise training including progression. Also discusses the mind body connection.  Reviews various relaxation techniques to help reduce and manage stress (i.e. Deep breathing, progressive muscle relaxation, and visualization). Balance handout provided to take home. Written material given at graduation. Flowsheet Row Cardiac Rehab from 08/15/2021 in [facility] Cardiac and Pulmonary Rehab  Date 06/06/21  Educator AS  Instruction Review Code 1- Verbalizes Understanding       Activity Barriers & Risk Stratification:   6 Minute Walk:  6  Minute Walk     Row Name 05/24/21 1619 08/03/21 0740       6 Minute Walk   Phase Initial Discharge    Distance 1110 feet 1220 feet    Distance % Change -- 9.9 %    Distance Feet Change -- 110 ft    Walk Time 6 minutes 6 minutes    # of Rest Breaks 0 0    MPH 2.1 2.31    METS 3.5 3.38    RPE 13 13    Perceived Dyspnea  2 --    VO2 Peak 12.25 11.84    Symptoms Yes (comment) Yes (comment)    Comments SOB hip and back pain 7/10    Resting HR 62 bpm 66 bpm    Resting BP 144/64 102/62    Resting Oxygen Saturation  95 % 97 %    Exercise Oxygen Saturation  during 6 min walk 97 % 95 %    Max Ex. HR 107 bpm 107 bpm    Max Ex. BP 194/68 158/64    2 Minute Post BP 148/64 --             Oxygen Initial Assessment:   Oxygen Re-Evaluation:   Oxygen Discharge (Final Oxygen Re-Evaluation):   Initial Exercise Prescription:  Initial Exercise Prescription - 05/24/21 1600       Date of Initial Exercise RX and Referring Provider   Date 05/24/21    Referring Provider [name]      Oxygen   Maintain Oxygen Saturation 88% or higher      Treadmill   MPH 2.1    Grade 1.5    Minutes 15    METs 3      Recumbant Bike    Level 2    RPM 60    Watts 40    Minutes 15    METs 3.5      NuStep   Level 3    SPM 80    Minutes 15    METs 3.5      Track   Laps 30    Minutes 2.1    METs 2.6      Prescription Details   Frequency (times per week) 3    Duration Progress to 30 minutes of continuous aerobic without signs/symptoms of physical distress      Intensity   THRR 40-80% of Max Heartrate 97-133    Ratings of Perceived Exertion 11-13    Perceived Dyspnea 0-4      Resistance Training   Training Prescription Yes    Weight 4 lb    Reps 10-15             Perform Capillary Blood Glucose checks as needed.  Exercise Prescription Changes:   Exercise Prescription Changes     Row Name 05/24/21 1600 05/31/21 0800 06/12/21 0700 06/25/21 1100 07/04/21 0700     Response to Exercise   Blood Pressure (Admit) 144/64 148/86 126/70 140/70 --   Blood Pressure (Exercise) 194/68 150/64 160/60 155/66 --   Blood Pressure (Exit) 148/64 122/64 108/60 116/80 --   Heart Rate (Admit) 62 bpm 62 bpm 93 bpm 74 bpm --   Heart Rate (Exercise) 107 bpm 103 bpm 109 bpm 99 bpm --   Heart Rate (Exit) 980 bpm 84 bpm 71 bpm 62 bpm --   Oxygen Saturation (Admit) 95 % -- -- -- --   Oxygen Saturation (Exercise) 97 % -- -- -- --   Rating of Perceived Exertion (  Exercise) _0 --   Perceived Dyspnea (Exercise) 2 -- -- -- --   Symptoms SOB none none hip pain --   Duration -- Continue with 30 min of aerobic exercise without signs/symptoms of physical distress. Continue with 30 min of aerobic exercise without signs/symptoms of physical distress. Continue with 30 min of aerobic exercise without signs/symptoms of physical distress. --   Intensity -- THRR unchanged THRR unchanged THRR unchanged --     Progression   Progression -- Continue to progress workloads to maintain intensity without signs/symptoms of physical distress. Continue to progress workloads to maintain intensity without signs/symptoms of physical distress.  Continue to progress workloads to maintain intensity without signs/symptoms of physical distress. --   Average METs -- 2.43 2.6 2.88 --     Resistance Training   Training Prescription -- Yes Yes Yes --   Weight -- 4 lb 4 lb 6 lb --   Reps -- 10-15 10-15 10-15 --     Interval Training   Interval Training -- No No No --     Recumbant Bike   Level -- -- 3 -- --   Watts -- -- 20 -- --   Minutes -- -- 15 -- --   METs -- -- 2.91 -- --     NuStep   Level -- 3 -- 4 --   Minutes -- 15 -- 30 --   METs -- 2.4 -- 3 --     Arm Ergometer   Level -- -- 1 -- --   Minutes -- -- 15 -- --     Track   Laps -- 27 -- 23 --   Minutes -- 15 -- 15 --   METs -- 2.47 -- 2.25 --     Home Exercise Plan   Plans to continue exercise at -- -- -- -- Home (comment)  walking   Frequency -- -- -- -- Add 2 additional days to program exercise sessions.   Initial Home Exercises Provided -- -- -- -- 07/04/21     Oxygen   Maintain Oxygen Saturation -- 88% or higher 88% or higher 88% or higher --    Row Name 07/09/21 1300 07/23/21 1600 08/06/21 0900         Response to Exercise   Blood Pressure (Admit) 118/70 126/74 120/60     Blood Pressure (Exercise) -- 98/56 --     Blood Pressure (Exit) 100/54 122/54 100/56     Heart Rate (Admit) 72 bpm 73 bpm 66 bpm     Heart Rate (Exercise) 96 bpm 82 bpm 107 bpm     Heart Rate (Exit) 90 bpm 63 bpm 67 bpm     Oxygen Saturation (Admit) -- 95 % --     Oxygen Saturation (Exercise) -- 96 % --     Oxygen Saturation (Exit) -- 98 % --     Rating of Perceived Exertion (Exercise) _1 Perceived Dyspnea (Exercise) -- 0 --     Symptoms none back and knee pain none     Duration Continue with 30 min of aerobic exercise without signs/symptoms of physical distress. Continue with 30 min of aerobic exercise without signs/symptoms of physical distress. Continue with 30 min of aerobic exercise without signs/symptoms of physical distress.     Intensity THRR unchanged THRR  unchanged THRR unchanged       Progression   Progression Continue to progress workloads to maintain intensity without signs/symptoms of physical distress. Continue  to progress workloads to maintain intensity without signs/symptoms of physical distress. Continue to progress workloads to maintain intensity without signs/symptoms of physical distress.     Average METs 3 2.57 3.07       Resistance Training   Training Prescription Yes Yes Yes     Weight 6 lb 6 lb 8 lb     Reps 10-15 10-15 10-15       Interval Training   Interval Training No No No       Recumbant Bike   Level 2 -- --     Minutes 15 -- --     METs 2.3 -- --       NuStep   Level _0 Minutes _1 METs 3._2 Track   Laps -- 21 --     Minutes -- 15 --     METs -- 2.14 --       Home Exercise Plan   Plans to continue exercise at Home (comment)  walking Home (comment)  walking Home (comment)  walking     Frequency Add 2 additional days to program exercise sessions. Add 2 additional days to program exercise sessions. Add 2 additional days to program exercise sessions.     Initial Home Exercises Provided 07/04/21 07/04/21 07/04/21       Oxygen   Maintain Oxygen Saturation 88% or higher 88% or higher 88% or higher              Exercise Comments:   Exercise Comments     Row Name 05/28/21 5465           Exercise Comments First full day of exercise!  Patient was oriented to gym and equipment including functions, settings, policies, and procedures.  Patient's individual exercise prescription and treatment plan were reviewed.  All starting workloads were established based on the results of the 6 minute walk test done at initial orientation visit.  The plan for exercise progression was also introduced and progression will be customized based on patient's performance and goals.                Exercise Goals and Review:   Exercise Goals     Row Name 05/24/21 1625             Exercise  Goals   Increase Physical Activity Yes       Intervention Provide advice, education, support and counseling about physical activity/exercise needs.;Develop an individualized exercise prescription for aerobic and resistive training based on initial evaluation findings, risk stratification, comorbidities and participant's personal goals.       Expected Outcomes Short Term: Attend rehab on a regular basis to increase amount of physical activity.;Long Term: Add in home exercise to make exercise part of routine and to increase amount of physical activity.;Long Term: Exercising regularly at least 3-5 days a week.       Increase Strength and Stamina Yes       Intervention Provide advice, education, support and counseling about physical activity/exercise needs.;Develop an individualized exercise prescription for aerobic and resistive training based on initial evaluation findings, risk stratification, comorbidities and participant's personal goals.       Expected Outcomes Short Term: Increase workloads from initial exercise prescription for resistance, speed, and METs.;Short Term: Perform resistance training exercises routinely during rehab and add in resistance training at home;Long Term: Improve cardiorespiratory fitness, muscular endurance and strength as  measured by increased METs and functional capacity (6MWT)       Able to understand and use rate of perceived exertion (RPE) scale Yes       Intervention Provide education and explanation on how to use RPE scale       Expected Outcomes Short Term: Able to use RPE daily in rehab to express subjective intensity level;Long Term:  Able to use RPE to guide intensity level when exercising independently       Able to understand and use Dyspnea scale Yes       Intervention Provide education and explanation on how to use Dyspnea scale       Expected Outcomes Short Term: Able to use Dyspnea scale daily in rehab to express subjective sense of shortness of breath during  exertion;Long Term: Able to use Dyspnea scale to guide intensity level when exercising independently       Knowledge and understanding of Target Heart Rate Range (THRR) Yes       Intervention Provide education and explanation of THRR including how the numbers were predicted and where they are located for reference       Expected Outcomes Short Term: Able to state/look up THRR;Short Term: Able to use daily as guideline for intensity in rehab;Long Term: Able to use THRR to govern intensity when exercising independently       Able to check pulse independently Yes       Intervention Provide education and demonstration on how to check pulse in carotid and radial arteries.;Review the importance of being able to check your own pulse for safety during independent exercise       Expected Outcomes Short Term: Able to explain why pulse checking is important during independent exercise;Long Term: Able to check pulse independently and accurately       Understanding of Exercise Prescription Yes       Intervention Provide education, explanation, and written materials on patient's individual exercise prescription       Expected Outcomes Short Term: Able to explain program exercise prescription;Long Term: Able to explain home exercise prescription to exercise independently                Exercise Goals Re-Evaluation :  Exercise Goals Re-Evaluation     Row Name 05/28/21 0925 05/31/21 0840 06/12/21 0734 06/25/21 1155 07/04/21 0738     Exercise Goal Re-Evaluation   Exercise Goals Review Knowledge and understanding of Target Heart Rate Range (THRR);Able to understand and use rate of perceived exertion (RPE) scale;Able to understand and use Dyspnea scale;Understanding of Exercise Prescription Increase Physical Activity;Increase Strength and Stamina;Understanding of Exercise Prescription Increase Physical Activity;Increase Strength and Stamina Increase Physical Activity;Increase Strength and Stamina Increase  Physical Activity;Increase Strength and Stamina;Understanding of Exercise Prescription;Able to understand and use rate of perceived exertion (RPE) scale;Able to understand and use Dyspnea scale;Knowledge and understanding of Target Heart Rate Range (THRR);Able to check pulse independently   Comments Reviewed RPE and dyspnea scales, THR and program prescription with pt today.  Pt voiced understanding and was given a copy of goals to take home.       Short: Use RPE daily to regulate intensity. Long: Follow program prescription in THR. Gruszka is off to a good start in rehab.  He has completed his first three full days of rehab.  We will continue to monitor his progress. Rehman is progressing well.  He has increased to 4 lb for strength work.  He reaches THR range.  We will continue to  monitor progress. Guster is doing well in rehab. He has increased to 6 lbs for resistance training. It appears he has been having hip pain which is limited his walking. He is up to level 4 on the T4 Nustep which he stayed on for the last couple of sessions due to his pain. Will continue to monitor. Reviewed home exercise with pt today.  Pt plans to walk at home for exercise.  Reviewed THR, pulse, RPE, sign and symptoms, pulse oximetery and when to call 911 or MD.  Also discussed weather considerations and indoor options.  Pt voiced understanding.   Expected Outcomes Short: Use RPE daily to regulate intensity. Long: Follow program prescription in THR. Short: Continue to attend rehab regularly Long: Continue to follow program prescription Short:  continue to progress workloads Long:  improve overall MET level Short: Start walking again if tolerated Long: Continue to increase overall MET level Short: Start to add in walking at home Long: Conitnue to improve stamina    Row Name 07/09/21 1315 07/23/21 1608 08/03/21 0756         Exercise Goal Re-Evaluation   Exercise Goals Review Increase Physical Activity;Increase Strength and  Stamina Increase Physical Activity;Increase Strength and Stamina;Understanding of Exercise Prescription Increase Physical Activity;Increase Strength and Stamina;Understanding of Exercise Prescription     Comments Miner continues to attend routinely. He is limited with back and knee pain and walks only when he can tolerate it. He has increased to level 6 on the T4 Nustep and does that for 30 minutes. He hits his [place] some sessions, however, RPEs are in good range. Will continue to monitor. Mcphee has been doing well in rehab.  He continues to struggle with pain, but was able to walk 21 laps a couple weeks ago.  He will normally stay on stepper.  Last visit on 5/3 he did not participate in weight training as he was feeling hot and needed to rest, BP and other vitals were fine. Surowiec improved his post walk by 9%!  He is planning to join the [facility] or rather be "dragged with" by his wife with Chief of Staff after graduation.    He is feeling stronger and has more stamina and feels better overall.     Expected Outcomes Short: Build up walking tolerance as best as he can Long: Continue to increase overall MET level Short: Return to regular attendance Long: Continue to improve stamina Short: Continue to work toward graduation Long: conitnue to exercise independently              Discharge Exercise Prescription (Final Exercise Prescription Changes):  Exercise Prescription Changes - 08/06/21 0900       Response to Exercise   Blood Pressure (Admit) 120/60    Blood Pressure (Exit) 100/56    Heart Rate (Admit) 66 bpm    Heart Rate (Exercise) 107 bpm    Heart Rate (Exit) 67 bpm    Rating of Perceived Exertion (Exercise) 13    Symptoms none    Duration Continue with 30 min of aerobic exercise without signs/symptoms of physical distress.    Intensity THRR unchanged      Progression   Progression Continue to progress workloads to maintain intensity without signs/symptoms of physical distress.     Average METs 3.07      Resistance Training   Training Prescription Yes    Weight 8 lb    Reps 10-15      Interval Training   Interval Training No  NuStep   Level 6    Minutes 30    METs 3      Home Exercise Plan   Plans to continue exercise at Home (comment)   walking   Frequency Add 2 additional days to program exercise sessions.    Initial Home Exercises Provided 07/04/21      Oxygen   Maintain Oxygen Saturation 88% or higher             Nutrition:  Target Goals: Understanding of nutrition guidelines, daily intake of sodium <1516m, cholesterol <2056m calories 30% from fat and 7% or less from saturated fats, daily to have 5 or more servings of fruits and vegetables.  Education: All About Nutrition: -Group instruction provided by verbal, written material, interactive activities, discussions, models, and posters to present general guidelines for heart healthy nutrition including fat, fiber, MyPlate, the role of sodium in heart healthy nutrition, utilization of the nutrition label, and utilization of this knowledge for meal planning. Follow up email sent as well. Written material given at graduation. [facility]  Education need identified 05/24/21  Date 05/30/21  Educator [place]ruction Review Code 1- Verbalizes Understanding       Biometrics:  Pre Biometrics - 05/24/21 1625       Pre Biometrics   Height _0  (1.854 m)    Weight 184 lb 8 oz (83.7 kg)    BMI (Calculated) 24.35    Single Leg Stand 13.79 seconds             Post Biometrics - 08/03/21 0741        Post  Biometrics   Height _1  (1.854 m)    Weight 187 lb (84.8 kg)    BMI (Calculated) 24.68    Single Leg Stand 30 seconds             Nutrition Therapy Plan and Nutrition Goals:  Nutrition Therapy & Goals - 06/04/21 0818       Nutrition Therapy   Diet Heart healthy, low Na    Drug/Food Interactions  Statins/Certain Fruits    Protein (specify units) 65g    Fiber 30 grams    Whole Grain Foods 3 servings    Saturated Fats 16 max. grams    Fruits and Vegetables 8 servings/day    Sodium 2 grams      Personal Nutrition Goals   Nutrition Goal ST: review paperwork, consider trying out some flavor or hidden vegetable techniques, continue with changes made LT: maintain changes made, eat at least 5 servings of fruits/vegetables per day (3 vegetables/2 fruit)    Comments [age].o. M admitted to cardiac rehab s/p stent placement. PMHx includes CKD stg 3, HTN, HLD, CHF, COPD, CAD, PAD, peptic ulcer disease. Relevant medications include CoQ10, B-12, iron-vit C, lasix, MVI, nicotine, fish oil, protonix, crestor. PYP Score: 69. Vegetables & Fruits 6/12. Breads, Grains & Cereals 7/12. Red & Processed Meat 8/12. Poultry 2/2. Fish & Shellfish 0/4. Beans, Nuts & Seeds 2/4. Milk & Dairy Foods 5/6. Toppings, Oils, Seasonings & Salt 16/20. Sweets, Snacks & Restaurant Food 13/14. Beverages 10/10. He has tried to include mnore whole grains, reduce his salt, he has reduced his meat and potatoes, he increased his fruit intake. He eats green peas, lettuce, green beans, carrots, but he does not care much for many vegetables. Grits or rasin bran, frosted flakes with pineapple or fruit cocktail. L: sandwich (tu[place]uwaitchicken, or sometimes ham  with mustard or small amount pimento cheese) D: wife will cook: lean meat, beans, baked potato with some butter, no added salt. He will add Mrs Deliah Boston and onions for added seasonings. Fruit (mostly pineapple or sometimes stewed apples. Drinks: coffee decaf with some sugar, water, iced tea (stevia). He does not have a big sweet tooth. He has an issue with satisfaction with food - he said it was explained to him as a "non-taster", he feels the lack of salt is exacerbating this issue. He used to eat the vegetables he likes with salt, but now he just forces them down. Discussed flavor building and  hidden vegetable techniques. Reviewed heart healthy diet.      Intervention Plan   Intervention Prescribe, educate and counsel regarding individualized specific dietary modifications aiming towards targeted core components such as weight, hypertension, lipid management, diabetes, heart failure and other comorbidities.;Nutrition handout(s) given to patient.    Expected Outcomes Short Term Goal: Understand basic principles of dietary content, such as calories, fat, sodium, cholesterol and nutrients.;Short Term Goal: A plan has been developed with personal nutrition goals set during dietitian appointment.;Long Term Goal: Adherence to prescribed nutrition plan.             Nutrition Assessments:  MEDIFICTS Score Key: ?70 Need to make dietary changes  40-70 Heart Healthy Diet ? 40 Therapeutic Level Cholesterol Diet  Flowsheet Row Cardiac Rehab from 08/06/2021 in [facility] Cardiac and Pulmonary Rehab  Picture Your Plate Total Score on Admission 69  Picture Your Plate Total Score on Discharge 66      Picture Your Plate Scores: <53 Unhealthy dietary pattern with much room for improvement. 41-50 Dietary pattern unlikely to meet recommendations for good health and room for improvement. 51-60 More healthful dietary pattern, with some room for improvement.  >60 Healthy dietary pattern, although there may be some specific behaviors that could be improved.    Nutrition Goals Re-Evaluation:  Nutrition Goals Re-Evaluation     [place] Name 07/02/21 0731 07/16/21 0743 08/03/21 0800         Goals   Current Weight 186 lb (84.4 kg) 186 lb (84.4 kg) --     Nutrition Goal Eat less processed meats. Eat for vegetables. Short: eat more vegetables. Long: maintain eating less processed foods.     Comment Patient states has cut out alot of sodium and is not sure how much more he can weed out. He is not one to over eat and watches what sodium. Klug states that he was sick and did not have much of an appetite.  He has had some salads and has been cutting back on processed meats. Gallicchio continues to try to eat better.  He aims for vareity and trying to get more vegetables.     Expected Outcome Short: continue current diet. Long: maintain a a diet that adheres to him Short: eat more vegetables. Long: maintain eating less processed foods. Continue to focus on heart healthy eating.              Nutrition Goals Discharge (Final Nutrition Goals Re-Evaluation):  Nutrition Goals Re-Evaluation - 08/03/21 0800       Goals   Nutrition Goal Short: eat more vegetables. Long: maintain eating less processed foods.    Comment Kallman continues to try to eat better.  He aims for vareity and trying to get more vegetables.    Expected Outcome Continue to focus on heart healthy eating.  Psychosocial: Target Goals: Acknowledge presence or absence of significant depression and/or stress, maximize coping skills, provide positive support system. Participant is able to verbalize types and ability to use techniques and skills needed for reducing stress and depression.   Education: Stress, Anxiety, and Depression - Group verbal and visual presentation to define topics covered.  Reviews how body is impacted by stress, anxiety, and depression.  Also discusses healthy ways to reduce stress and to treat/manage anxiety and depression.  Written material given at graduation.   Education: Sleep Hygiene -Provides group verbal and written instruction about how sleep can affect your health.  Define sleep hygiene, discuss sleep cycles and impact of sleep habits. Review good sleep hygiene tips.    Initial Review & Psychosocial Screening:  Initial Psych Review & Screening - 05/17/21 0903       Initial Review   Current issues with None Identified      Family Dynamics   Good Support System? Yes    Comments He can look to his wife and two sons for support. He has a positive outlook on his health. He does not  show any signs of depression or anxiety.      Barriers   Psychosocial barriers to participate in program The patient should benefit from training in stress management and relaxation.;There are no identifiable barriers or psychosocial needs.      Screening Interventions   Interventions Encouraged to exercise;To provide support and resources with identified psychosocial needs;Provide feedback about the scores to participant    Expected Outcomes Short Term goal: Utilizing psychosocial counselor, staff and physician to assist with identification of specific Stressors or current issues interfering with healing process. Setting desired goal for each stressor or current issue identified.;Long Term Goal: Stressors or current issues are controlled or eliminated.;Short Term goal: Identification and review with participant of any Quality of Life or Depression concerns found by scoring the questionnaire.;Long Term goal: The participant improves quality of Life and PHQ9 Scores as seen by post scores and/or verbalization of changes             Quality of Life Scores:   Quality of Life - 08/06/21 0927       Quality of Life Scores   Health/Function Pre 6.37 %    Health/Function Post 17.53 %    Health/Function % Change 175.2 %    Socioeconomic Pre 19.07 %    Socioeconomic Post 19.64 %    Socioeconomic % Change  2.99 %    Psych/Spiritual Pre 13.93 %    Psych/Spiritual Post 18.14 %    Psych/Spiritual % Change 30.22 %    Family Pre 24.6 %    Family Post 22.8 %    Family % Change -7.32 %    GLOBAL Pre 13.22 %    GLOBAL Post 18.87 %    GLOBAL % Change 42.74 %            Scores of 19 and below usually indicate a poorer quality of life in these areas.  A difference of  2-3 points is a clinically meaningful difference.  A difference of 2-3 points in the total score of the Quality of Life Index has been associated with significant improvement in overall quality of life, self-image, physical symptoms,  and general health in studies assessing change in quality of life.  PHQ-9: Review Flowsheet        08/06/2021 05/24/2021  Depression screen PHQ 2/9  Decreased Interest 2 1  Down, Depressed, Hopeless 1 0  PHQ - 2 Score 3 1  Altered sleeping 2 1  Tired, decreased energy 2 1  Change in appetite 1 0  Feeling bad or failure about yourself  1 0  Trouble concentrating 1 0  Moving slowly or fidgety/restless 0 0  Suicidal thoughts 0 0  PHQ-9 Score 10 3  Difficult doing work/chores Somewhat difficult Somewhat difficult         Interpretation of Total Score  Total Score Depression Severity:  1-4 = Minimal depression, 5-9 = Mild depression, 10-14 = Moderate depression, 15-19 = Moderately severe depression, 20-27 = Severe depression   Psychosocial Evaluation and Intervention:  Psychosocial Evaluation - 05/17/21 0904       Psychosocial Evaluation & Interventions   Interventions Encouraged to exercise with the program and follow exercise prescription;Relaxation education;Stress management education    Comments He can look to his wife and two sons for support. He has a positive outlook on his health. He does not show any signs of depression or anxiety.    Expected Outcomes Short: Start HeartTrack to help with mood. Long: Maintain a healthy mental state    Continue Psychosocial Services  Follow up required by staff             Psychosocial Re-Evaluation:  Psychosocial Re-Evaluation     [place] Name 07/02/21 0731 07/16/21 0745 08/03/21 0759         Psychosocial Re-Evaluation   Current issues with None Identified Current Stress Concerns Current Stress Concerns     Comments Patient reports no issues with their current mental states, sleep, stress, depression or anxiety. Will follow up with patient in a few weeks for any changes. Boeckman has a daughter in law that has progressive ALS. His other daughter inlaw had her appendix taken out. He himself states that he does not have any concerns  for his mental health. He feels some of the stress and feels for his family. Sturgill is nearing graduation.  He has enjoyed the program and feels better and has more strength.  He continues to cope with stress from family but does the best he can.     Expected Outcomes Short: Continue to exercise regularly to support mental health and notify staff of any changes. Long: maintain mental health and well being through teaching of rehab or prescribed medications independently. Short: Attend HeartTrack stress management education to decrease stress. Long: Maintain exercise Post HeartTrack to keep stress at a minimum. Continue to exercise for mental health     Interventions Encouraged to attend Cardiac Rehabilitation for the exercise -- Encouraged to attend Cardiac Rehabilitation for the exercise     Continue Psychosocial Services  Follow up required by staff Follow up required by staff Follow up required by staff              Psychosocial Discharge (Final Psychosocial Re-Evaluation):  Psychosocial Re-Evaluation - 08/03/21 0759       Psychosocial Re-Evaluation   Current issues with Current Stress Concerns    Comments Colavito is nearing graduation.  He has enjoyed the program and feels better and has more strength.  He continues to cope with stress from family but does the best he can.    Expected Outcomes Continue to exercise for mental health    Interventions Encouraged to attend Cardiac Rehabilitation for the exercise    Continue Psychosocial Services  Follow up required by staff             Vocational Rehabilitation: Provide vocational rehab assistance to  qualifying candidates.   Vocational Rehab Evaluation & Intervention:  Vocational Rehab - 08/06/21 4287       Initial Vocational Rehab Evaluation & Intervention   Assessment shows need for Vocational Rehabilitation No      Discharge Vocational Rehab   Discharge Vocational Rehabilitation reired              Education: Education Goals: Education classes will be provided on a variety of topics geared toward better understanding of heart health and risk factor modification. Participant will state understanding/return demonstration of topics presented as noted by education test scores.  Learning Barriers/Preferences:  Learning Barriers/Preferences - 05/17/21 0901       Learning Barriers/Preferences   Learning Barriers None    Learning Preferences None             General Cardiac Education Topics:  AED/CPR: - Group verbal and written instruction with the use of models to demonstrate the basic use of the AED with the basic ABC's of resuscitation.   Anatomy and Cardiac Procedures: - Group verbal and visual presentation and models provide information about basic cardiac anatomy and function. Reviews the testing methods done to diagnose heart disease and the outcomes of the test results. Describes the treatment choices: Medical Management, Angioplasty, or Coronary Bypass Surgery for treating various heart conditions including Myocardial Infarction, Angina, Valve Disease, and Cardiac Arrhythmias.  Written material given at graduation. Flowsheet Row Cardiac Rehab from 08/15/2021 in [facility] Cardiac and Pulmonary Rehab  Education need identified 05/24/21  Date 07/25/21  Educator SB  Instruction Review Code 1- Verbalizes Understanding       Medication Safety: - Group verbal and visual instruction to review commonly prescribed medications for heart and lung disease. Reviews the medication, class of the drug, and side effects. Includes the steps to properly store meds and maintain the prescription regimen.  Written material given at graduation. Flowsheet Row Cardiac Rehab from 08/15/2021 in [facility] Cardiac and Pulmonary Rehab  Date 06/13/21  Educator SB  Instruction Review Code 1- Verbalizes Understanding       Intimacy: - Group verbal instruction through game format to discuss how heart and  lung disease can affect sexual intimacy. Written material given at graduation.. Flowsheet Row Cardiac Rehab from 08/15/2021 in [facility] Cardiac and Pulmonary Rehab  Date 07/18/21  Educator [facility]  Instruction Review Code 1- Verbalizes Understanding       Know Your Numbers and Heart Failure: - Group verbal and visual instruction to discuss disease risk factors for cardiac and pulmonary disease and treatment options.  Reviews associated critical values for Overweight/Obesity, Hypertension, Cholesterol, and Diabetes.  Discusses basics of heart failure: signs/symptoms and treatments.  Introduces Heart Failure Zone chart for action plan for heart failure.  Written material given at graduation.   Infection Prevention: - Provides verbal and written material to individual with discussion of infection control including proper hand washing and proper equipment cleaning during exercise session. Flowsheet Row Cardiac Rehab from 08/15/2021 in [facility] Cardiac and Pulmonary Rehab  Date 05/24/21  Educator AS  Instruction Review Code 1- Verbalizes Understanding       Falls Prevention: - Provides verbal and written material to individual with discussion of falls prevention and safety. Flowsheet Row Cardiac Rehab from 08/15/2021 in [facility] Cardiac and Pulmonary Rehab  Date 05/24/21  Educator AS  Instruction Review Code 1- Verbalizes Understanding       Other: -Provides group and verbal instruction on various topics (see comments)   Knowledge Questionnaire Score:  Knowledge Questionnaire Score -  08/06/21 1308       Knowledge Questionnaire Score   Pre Score 23/26    Post Score 25/26             Core Components/Risk Factors/Patient Goals at Admission:  Personal Goals and Risk Factors at Admission - 05/24/21 1630       Core Components/Risk Factors/Patient Goals on Admission    Weight Management Yes;Weight Maintenance    Intervention Weight Management: Develop a combined nutrition and exercise  program designed to reach desired caloric intake, while maintaining appropriate intake of nutrient and fiber, sodium and fats, and appropriate energy expenditure required for the weight goal.;Weight Management: Provide education and appropriate resources to help participant work on and attain dietary goals.;Weight Management/Obesity: Establish reasonable short term and long term weight goals.    Expected Outcomes Short Term: Continue to assess and modify interventions until short term weight is achieved;Long Term: Adherence to nutrition and physical activity/exercise program aimed toward attainment of established weight goal;Understanding recommendations for meals to include 15-35% energy as protein, 25-35% energy from fat, 35-60% energy from carbohydrates, less than 250m of dietary cholesterol, 20-35 gm of total fiber daily;Understanding of distribution of calorie intake throughout the day with the consumption of 4-5 meals/snacks;Weight Maintenance: Understanding of the daily nutrition guidelines, which includes 25-35% calories from fat, 7% or less cal from saturated fats, less than 2039mcholesterol, less than 1.5gm of sodium, & 5 or more servings of fruits and vegetables daily    Heart Failure Yes    Intervention Provide a combined exercise and nutrition program that is supplemented with education, support and counseling about heart failure. Directed toward relieving symptoms such as shortness of breath, decreased exercise tolerance, and extremity edema.    Expected Outcomes Improve functional capacity of life;Short term: Attendance in program 2-3 days a week with increased exercise capacity. Reported lower sodium intake. Reported increased fruit and vegetable intake. Reports medication compliance.;Short term: Daily weights obtained and reported for increase. Utilizing diuretic protocols set by physician.;Long term: Adoption of self-care skills and reduction of barriers for early signs and symptoms  recognition and intervention leading to self-care maintenance.    Hypertension Yes    Intervention Provide education on lifestyle modifcations including regular physical activity/exercise, weight management, moderate sodium restriction and increased consumption of fresh fruit, vegetables, and low fat dairy, alcohol moderation, and smoking cessation.;Monitor prescription use compliance.    Expected Outcomes Short Term: Continued assessment and intervention until BP is < 140/9023mG in hypertensive participants. < 130/38m8m in hypertensive participants with diabetes, heart failure or chronic kidney disease.;Long Term: Maintenance of blood pressure at goal levels.    Lipids Yes    Intervention Provide education and support for participant on nutrition & aerobic/resistive exercise along with prescribed medications to achieve LDL <70mg17mL >40mg.84mExpected Outcomes Short Term: Participant states understanding of desired cholesterol values and is compliant with medications prescribed. Participant is following exercise prescription and nutrition guidelines.;Long Term: Cholesterol controlled with medications as prescribed, with individualized exercise RX and with personalized nutrition plan. Value goals: LDL < 70mg, 17m> 40 mg.             Education:Diabetes - Individual verbal and written instruction to review signs/symptoms of diabetes, desired ranges of glucose level fasting, after meals and with exercise. Acknowledge that pre and post exercise glucose checks will be done for 3 sessions at entry of program.   Core Components/Risk Factors/Patient Goals Review:   Goals and Risk Factor Review  [place] Name 07/02/21 0735 07/16/21 0734 08/03/21 0801         Core Components/Risk Factors/Patient Goals Review   Personal Goals Review Weight Management/Obesity;Hypertension Weight Management/Obesity;Hypertension;Other Weight Management/Obesity;Hypertension;Other     Review Arieh is doing well in  rehab and has been maintaining his weight. He has been checking his oxygen at home and his heart rate. Went over patients target heart rate and what he should do when he is done with the program. Patient verbalizes understanding. Booher has been out sick for about a week. He lost some weight from being sich and has since then brought his weight up. His blood pressure has been stable and sometimes a little higher in rehab. He states he has been drinking water to help make sure he is hydrated after being sick. Terhune is nearing graduation.  He has been holding steady on his weight and pressures are doing well.     Expected Outcomes Short: maintain heart rate and weight post HeartTrack. Long: maintain HR and weight independently. Short: continue to exercise in [place]. Long: Maintain exercise post HeartTrack. Continue to monitor risk factors.              Core Components/Risk Factors/Patient Goals at Discharge (Final Review):   Goals and Risk Factor Review - 08/03/21 0801       Core Components/Risk Factors/Patient Goals Review   Personal Goals Review Weight Management/Obesity;Hypertension;Other    Review Born is nearing graduation.  He has been holding steady on his weight and pressures are doing well.    Expected Outcomes Continue to monitor risk factors.             ITP Comments:  ITP Comments     Row Name 05/17/21 0900 05/24/21 1633 05/28/21 0925 06/04/21 0753 06/06/21 0915   ITP Comments Virtual Visit completed. Patient informed on EP and RD appointment and 6 Minute walk test. Patient also informed of patient health questionnaires on My Chart. Patient Verbalizes understanding. Visit diagnosis can be found in [facility] 04/16/2021. Completed 6MWT and gym orientation. Initial ITP created and sent for review to Dr. Emily Filbert, Medical Director. First full day of exercise!  Patient was oriented to gym and equipment including functions, settings, policies, and procedures.  Patient's  individual exercise prescription and treatment plan were reviewed.  All starting workloads were established based on the results of the 6 minute walk test done at initial orientation visit.  The plan for exercise progression was also introduced and progression will be customized based on patient's performance and goals. Completed initial RD consultation 30 Day review completed. Medical Director ITP review done, changes made as directed, and signed approval by Medical Director.   New to program    Row Name 07/04/21 1415 08/01/21 0920 08/17/21 0807       ITP Comments 30 Day review completed. Medical Director ITP review done, changes made as directed, and signed approval by Medical Director. 30 Day review completed. Medical Director ITP review done, changes made as directed, and signed approval by Medical Director. Ruscitti graduated today from  rehab with 36 sessions completed.  Details of the patient's exercise prescription and what He needs to do in order to continue the prescription and progress were discussed with patient.  Patient was given a copy of prescription and goals.  Patient verbalized understanding.  [place] [facility]              Comments: Discharge ITP

## 2021-09-03 DIAGNOSIS — N261 Atrophy of kidney (terminal): Secondary | ICD-10-CM | POA: Diagnosis not present

## 2021-09-03 DIAGNOSIS — I129 Hypertensive chronic kidney disease with stage 1 through stage 4 chronic kidney disease, or unspecified chronic kidney disease: Secondary | ICD-10-CM | POA: Diagnosis not present

## 2021-09-03 DIAGNOSIS — I10 Essential (primary) hypertension: Secondary | ICD-10-CM | POA: Diagnosis not present

## 2021-09-03 DIAGNOSIS — D631 Anemia in chronic kidney disease: Secondary | ICD-10-CM | POA: Diagnosis not present

## 2021-09-03 DIAGNOSIS — N1832 Chronic kidney disease, stage 3b: Secondary | ICD-10-CM | POA: Diagnosis not present

## 2021-09-03 DIAGNOSIS — N280 Ischemia and infarction of kidney: Secondary | ICD-10-CM | POA: Diagnosis not present

## 2021-09-04 DIAGNOSIS — N1832 Chronic kidney disease, stage 3b: Secondary | ICD-10-CM | POA: Diagnosis not present

## 2021-09-04 DIAGNOSIS — N261 Atrophy of kidney (terminal): Secondary | ICD-10-CM | POA: Diagnosis not present

## 2021-09-04 DIAGNOSIS — N280 Ischemia and infarction of kidney: Secondary | ICD-10-CM | POA: Diagnosis not present

## 2021-09-04 DIAGNOSIS — N184 Chronic kidney disease, stage 4 (severe): Secondary | ICD-10-CM | POA: Diagnosis not present

## 2021-09-04 DIAGNOSIS — E782 Mixed hyperlipidemia: Secondary | ICD-10-CM | POA: Diagnosis not present

## 2021-09-04 DIAGNOSIS — I10 Essential (primary) hypertension: Secondary | ICD-10-CM | POA: Diagnosis not present

## 2021-09-11 DIAGNOSIS — M5136 Other intervertebral disc degeneration, lumbar region: Secondary | ICD-10-CM | POA: Diagnosis not present

## 2021-09-11 DIAGNOSIS — Z125 Encounter for screening for malignant neoplasm of prostate: Secondary | ICD-10-CM | POA: Diagnosis not present

## 2021-09-11 DIAGNOSIS — I429 Cardiomyopathy, unspecified: Secondary | ICD-10-CM | POA: Diagnosis not present

## 2021-09-11 DIAGNOSIS — N179 Acute kidney failure, unspecified: Secondary | ICD-10-CM | POA: Diagnosis not present

## 2021-09-11 DIAGNOSIS — N170 Acute kidney failure with tubular necrosis: Secondary | ICD-10-CM | POA: Diagnosis not present

## 2021-09-13 DIAGNOSIS — N1832 Chronic kidney disease, stage 3b: Secondary | ICD-10-CM | POA: Diagnosis not present

## 2021-10-11 DIAGNOSIS — N170 Acute kidney failure with tubular necrosis: Secondary | ICD-10-CM | POA: Diagnosis not present

## 2021-10-19 ENCOUNTER — Other Ambulatory Visit: Payer: Self-pay

## 2021-10-19 NOTE — Patient Outreach (Signed)
[name]) Care Management  10/19/2021  Gary Mccoy [birth year] [phone]   Telephone Screen    Outreach call to patient to introduce [facility] services and assess care needs as part of benefit of PCP office and insurance plan. Spoke with patient who was guarded with info. He denies any acute issues or concerns at present. Lives with supportive spouse and remains independent with ADLs/IADLs. States BP and other conditions managed at present.    Main healthcare issue/concern today: None reported.    Health Maintenance/Care Gaps Addressed & Education Provided:   -Last AWV:06/13/21 -Flu Vaccine: 02/14/21  Plan: RN CM will close case and send successful outreach letter to patient for more info and future reference.    Enzo Montgomery, RN,BSN,CCM [place] [facility] Telephonic Care Management Coordinator Direct Phone: [phone] Toll Free: [phone] Fax: [phone]

## 2021-10-29 ENCOUNTER — Emergency Department: Payer: Medicare HMO

## 2021-10-29 ENCOUNTER — Observation Stay
Admission: EM | Admit: 2021-10-29 | Discharge: 2021-10-30 | Disposition: A | Discharge status: Home / Self Care | Payer: Medicare HMO | Attending: Internal Medicine | Admitting: Internal Medicine

## 2021-10-29 DIAGNOSIS — Z20822 Contact with and (suspected) exposure to covid-19: Secondary | ICD-10-CM | POA: Insufficient documentation

## 2021-10-29 DIAGNOSIS — D631 Anemia in chronic kidney disease: Secondary | ICD-10-CM | POA: Insufficient documentation

## 2021-10-29 DIAGNOSIS — D649 Anemia, unspecified: Secondary | ICD-10-CM | POA: Diagnosis present

## 2021-10-29 DIAGNOSIS — N184 Chronic kidney disease, stage 4 (severe): Secondary | ICD-10-CM | POA: Diagnosis present

## 2021-10-29 DIAGNOSIS — Z79899 Other long term (current) drug therapy: Secondary | ICD-10-CM | POA: Insufficient documentation

## 2021-10-29 DIAGNOSIS — I509 Heart failure, unspecified: Secondary | ICD-10-CM | POA: Diagnosis not present

## 2021-10-29 DIAGNOSIS — I2510 Atherosclerotic heart disease of native coronary artery without angina pectoris: Secondary | ICD-10-CM | POA: Diagnosis not present

## 2021-10-29 DIAGNOSIS — F17210 Nicotine dependence, cigarettes, uncomplicated: Secondary | ICD-10-CM | POA: Diagnosis not present

## 2021-10-29 DIAGNOSIS — E785 Hyperlipidemia, unspecified: Secondary | ICD-10-CM | POA: Diagnosis present

## 2021-10-29 DIAGNOSIS — I5043 Acute on chronic combined systolic (congestive) and diastolic (congestive) heart failure: Principal | ICD-10-CM | POA: Insufficient documentation

## 2021-10-29 DIAGNOSIS — R059 Cough, unspecified: Secondary | ICD-10-CM | POA: Diagnosis not present

## 2021-10-29 DIAGNOSIS — I10 Essential (primary) hypertension: Secondary | ICD-10-CM | POA: Diagnosis not present

## 2021-10-29 DIAGNOSIS — I639 Cerebral infarction, unspecified: Secondary | ICD-10-CM | POA: Diagnosis present

## 2021-10-29 DIAGNOSIS — J90 Pleural effusion, not elsewhere classified: Secondary | ICD-10-CM | POA: Diagnosis not present

## 2021-10-29 DIAGNOSIS — N179 Acute kidney failure, unspecified: Secondary | ICD-10-CM | POA: Diagnosis not present

## 2021-10-29 DIAGNOSIS — I130 Hypertensive heart and chronic kidney disease with heart failure and stage 1 through stage 4 chronic kidney disease, or unspecified chronic kidney disease: Secondary | ICD-10-CM | POA: Insufficient documentation

## 2021-10-29 DIAGNOSIS — Z8673 Personal history of transient ischemic attack (TIA), and cerebral infarction without residual deficits: Secondary | ICD-10-CM | POA: Diagnosis not present

## 2021-10-29 DIAGNOSIS — I739 Peripheral vascular disease, unspecified: Secondary | ICD-10-CM | POA: Diagnosis not present

## 2021-10-29 DIAGNOSIS — D500 Iron deficiency anemia secondary to blood loss (chronic): Secondary | ICD-10-CM | POA: Diagnosis not present

## 2021-10-29 DIAGNOSIS — D72829 Elevated white blood cell count, unspecified: Secondary | ICD-10-CM | POA: Diagnosis not present

## 2021-10-29 DIAGNOSIS — I110 Hypertensive heart disease with heart failure: Secondary | ICD-10-CM | POA: Diagnosis not present

## 2021-10-29 DIAGNOSIS — R0602 Shortness of breath: Secondary | ICD-10-CM | POA: Diagnosis present

## 2021-10-29 DIAGNOSIS — Z7982 Long term (current) use of aspirin: Secondary | ICD-10-CM | POA: Diagnosis not present

## 2021-10-29 LAB — CBC WITH DIFFERENTIAL/PLATELET
Abs Immature Granulocytes: 0.06 10*3/uL (ref 0.00–0.07)
Basophils Absolute: 0 10*3/uL (ref 0.0–0.1)
Basophils Relative: 0 %
Eosinophils Absolute: 0 10*3/uL (ref 0.0–0.5)
Eosinophils Relative: 0 %
HCT: 39 % (ref 39.0–52.0)
Hemoglobin: 12.9 g/dL — ABNORMAL LOW (ref 13.0–17.0)
Immature Granulocytes: 1 %
Lymphocytes Relative: 7 %
Lymphs Abs: 0.8 10*3/uL (ref 0.7–4.0)
MCH: 29.1 pg (ref 26.0–34.0)
MCHC: 33.1 g/dL (ref 30.0–36.0)
MCV: 88 fL (ref 80.0–100.0)
Monocytes Absolute: 1.1 10*3/uL — ABNORMAL HIGH (ref 0.1–1.0)
Monocytes Relative: 9 %
Neutro Abs: 10 10*3/uL — ABNORMAL HIGH (ref 1.7–7.7)
Neutrophils Relative %: 83 %
Platelets: 163 10*3/uL (ref 150–400)
RBC: 4.43 MIL/uL (ref 4.22–5.81)
RDW: 15.6 % — ABNORMAL HIGH (ref 11.5–15.5)
WBC: 12 10*3/uL — ABNORMAL HIGH (ref 4.0–10.5)
nRBC: 0 % (ref 0.0–0.2)

## 2021-10-29 LAB — BASIC METABOLIC PANEL
Anion gap: 9 (ref 5–15)
BUN: 46 mg/dL — ABNORMAL HIGH (ref 8–23)
CO2: 23 mmol/L (ref 22–32)
Calcium: 9 mg/dL (ref 8.9–10.3)
Chloride: 106 mmol/L (ref 98–111)
Creatinine, Ser: 3.49 mg/dL — ABNORMAL HIGH (ref 0.61–1.24)
GFR, Estimated: 18 mL/min — ABNORMAL LOW (ref >60)
Glucose, Bld: 111 mg/dL — ABNORMAL HIGH (ref 70–99)
Potassium: 4.6 mmol/L (ref 3.5–5.1)
Sodium: 138 mmol/L (ref 135–145)

## 2021-10-29 LAB — SARS CORONAVIRUS 2 BY RT PCR: SARS Coronavirus 2 by RT PCR: NEGATIVE

## 2021-10-29 LAB — BRAIN NATRIURETIC PEPTIDE: B Natriuretic Peptide: 2155.8 pg/mL — ABNORMAL HIGH (ref 0.0–100.0)

## 2021-10-29 LAB — TROPONIN I (HIGH SENSITIVITY)
Troponin I (High Sensitivity): 108 ng/L (ref <18)
Troponin I (High Sensitivity): 104 ng/L (ref <18)

## 2021-10-29 LAB — PROCALCITONIN: Procalcitonin: <0.1 ng/mL

## 2021-10-29 MED ORDER — ASPIRIN 81 MG PO CHEW
324.0000 mg | CHEWABLE_TABLET | Freq: Once | ORAL | Status: AC
  Administered 2021-10-29: 324 mg via ORAL
  Filled 2021-10-29: qty 4

## 2021-10-29 MED ORDER — ALBUTEROL SULFATE (2.5 MG/3ML) 0.083% IN NEBU: 2.5000 mg | INHALATION_SOLUTION | RESPIRATORY_TRACT | Status: DC | PRN

## 2021-10-29 MED ORDER — HYDRALAZINE HCL 50 MG PO TABS
50.0000 mg | ORAL_TABLET | Freq: Two times a day (BID) | ORAL | Status: DC
  Administered 2021-10-29 – 2021-10-30 (×2): 50 mg via ORAL
  Filled 2021-10-29 (×2): qty 1

## 2021-10-29 MED ORDER — RIVAROXABAN 15 MG PO TABS
15.0000 mg | ORAL_TABLET | Freq: Every day | ORAL | Status: DC
  Administered 2021-10-29: 15 mg via ORAL
  Filled 2021-10-29 (×2): qty 1

## 2021-10-29 MED ORDER — ISOSORBIDE MONONITRATE ER 60 MG PO TB24
60.0000 mg | ORAL_TABLET | Freq: Every day | ORAL | Status: DC
  Administered 2021-10-29 – 2021-10-30 (×2): 60 mg via ORAL
  Filled 2021-10-29 (×2): qty 1

## 2021-10-29 MED ORDER — NITROGLYCERIN 0.4 MG SL SUBL: 0.4000 mg | SUBLINGUAL_TABLET | SUBLINGUAL | Status: DC | PRN

## 2021-10-29 MED ORDER — ROSUVASTATIN CALCIUM 10 MG PO TABS
20.0000 mg | ORAL_TABLET | Freq: Every day | ORAL | Status: DC
  Administered 2021-10-29 – 2021-10-30 (×2): 20 mg via ORAL
  Filled 2021-10-29: qty 2
  Filled 2021-10-29: qty 1

## 2021-10-29 MED ORDER — CLOPIDOGREL BISULFATE 75 MG PO TABS
75.0000 mg | ORAL_TABLET | Freq: Every day | ORAL | Status: DC
  Administered 2021-10-29 – 2021-10-30 (×2): 75 mg via ORAL
  Filled 2021-10-29 (×2): qty 1

## 2021-10-29 MED ORDER — ONDANSETRON HCL 4 MG/2ML IJ SOLN
4.0000 mg | Freq: Four times a day (QID) | INTRAMUSCULAR | Status: DC | PRN
Start: 2021-10-29 — End: 2021-10-30

## 2021-10-29 MED ORDER — METOPROLOL SUCCINATE ER 25 MG PO TB24
25.0000 mg | ORAL_TABLET | Freq: Every day | ORAL | Status: DC
  Administered 2021-10-29 – 2021-10-30 (×2): 25 mg via ORAL
  Filled 2021-10-29 (×2): qty 1

## 2021-10-29 MED ORDER — FE FUMARATE-B12-VIT C-FA-IFC PO CAPS
1.0000 | ORAL_CAPSULE | Freq: Every day | ORAL | Status: DC
  Administered 2021-10-30: 1 via ORAL
  Filled 2021-10-29: qty 1

## 2021-10-29 MED ORDER — ONDANSETRON HCL 4 MG PO TABS: 4.0000 mg | ORAL_TABLET | Freq: Four times a day (QID) | ORAL | Status: DC | PRN

## 2021-10-29 MED ORDER — PANTOPRAZOLE SODIUM 40 MG PO TBEC
40.0000 mg | DELAYED_RELEASE_TABLET | Freq: Every day | ORAL | Status: DC
  Administered 2021-10-29 – 2021-10-30 (×2): 40 mg via ORAL
  Filled 2021-10-29 (×2): qty 1

## 2021-10-29 MED ORDER — FUROSEMIDE 10 MG/ML IJ SOLN
40.0000 mg | Freq: Every day | INTRAMUSCULAR | Status: AC
  Administered 2021-10-30: 40 mg via INTRAVENOUS
  Filled 2021-10-29: qty 4

## 2021-10-29 MED ORDER — NICOTINE 21 MG/24HR TD PT24: 21.0000 mg | MEDICATED_PATCH | Freq: Every day | TRANSDERMAL | Status: DC | PRN

## 2021-10-29 MED ORDER — FUROSEMIDE 10 MG/ML IJ SOLN
40.0000 mg | Freq: Once | INTRAMUSCULAR | Status: AC
  Administered 2021-10-29: 40 mg via INTRAVENOUS
  Filled 2021-10-29: qty 4

## 2021-10-29 MED ORDER — COQ10 200 MG PO CAPS: 200.0000 mg | ORAL_CAPSULE | Freq: Every day | ORAL | Status: DC

## 2021-10-29 MED ORDER — ACETAMINOPHEN 650 MG RE SUPP: 650.0000 mg | Freq: Four times a day (QID) | RECTAL | Status: DC | PRN

## 2021-10-29 MED ORDER — ALBUTEROL SULFATE (2.5 MG/3ML) 0.083% IN NEBU: 2.5000 mg | INHALATION_SOLUTION | Freq: Four times a day (QID) | RESPIRATORY_TRACT | Status: DC | PRN

## 2021-10-29 MED ORDER — SENNOSIDES-DOCUSATE SODIUM 8.6-50 MG PO TABS: 1.0000 | ORAL_TABLET | Freq: Every evening | ORAL | Status: DC | PRN

## 2021-10-29 MED ORDER — ACETAMINOPHEN 325 MG PO TABS
650.0000 mg | ORAL_TABLET | Freq: Four times a day (QID) | ORAL | Status: DC | PRN
  Administered 2021-10-30: 650 mg via ORAL
  Filled 2021-10-29: qty 2

## 2021-10-29 MED ORDER — AMLODIPINE BESYLATE 5 MG PO TABS
5.0000 mg | ORAL_TABLET | Freq: Two times a day (BID) | ORAL | Status: DC
  Administered 2021-10-29 – 2021-10-30 (×2): 5 mg via ORAL
  Filled 2021-10-29 (×2): qty 1

## 2021-10-29 MED ORDER — VITAMIN B-12 1000 MCG PO TABS
1000.0000 ug | ORAL_TABLET | Freq: Every day | ORAL | Status: DC
  Administered 2021-10-30: 1000 ug via ORAL
  Filled 2021-10-29: qty 1

## 2021-10-29 NOTE — Assessment & Plan Note (Signed)
-  Resume Plavix 75 mg daily

## 2021-10-29 NOTE — Progress Notes (Signed)
PHARMACIST - PHYSICIAN ORDER COMMUNICATION  CONCERNING: P&T Medication Policy on Herbal Medications  DESCRIPTION:  This patient's order for:  CoQ10  has been noted.  This product(s) is classified as an "herbal" or natural product. Due to a lack of definitive safety studies or FDA approval, nonstandard manufacturing practices, plus the potential risk of unknown drug-drug interactions while on inpatient medications, the Pharmacy and Therapeutics Committee does not permit the use of "herbal" or natural products of this type within [facility].   ACTION TAKEN: The pharmacy department is unable to verify this order at this time. Please reevaluate patient's clinical condition at discharge and address if the herbal or natural product(s) should be resumed at that time.  Paulina Fusi, PharmD, BCPS 10/29/2021 6:08 PM

## 2021-10-29 NOTE — ED Triage Notes (Signed)
Patient brought over from [facility]. Pt c/o SOB, little urinary output X2 days, soreness on bilateral sides. A&O x4 upon arrival to ED.  Hx strokes and CHF

## 2021-10-29 NOTE — Assessment & Plan Note (Deleted)
-  At baseline

## 2021-10-29 NOTE — Assessment & Plan Note (Signed)
-  With decreased urine output - Query cardiorenal versus UTI - Status post furosemide 40 mg IV per EDP - Check UA - BMP in a.m.

## 2021-10-29 NOTE — ED Provider Notes (Signed)
[facility] Provider Note    Event Date/Time   First MD Initiated Contact with Patient 10/29/21 1502     (approximate)   History   Shortness of Breath   HPI  Gary Mccoy. is a 71 y.o. male with past medical history of peripheral artery disease, systolic and diastolic heart failure CVA who presents with shortness of breath.  Symptoms started about 3 days ago.  Patient endorses feeling congestion in his chest feeling shortness of breath with exertion and with lying flat.  He notes that his nose and sinuses feel dry denies productive cough.  Also feels pressure in his chest with walking around and lying flat.  Tells me that he feels like he did last year when he presented with an exacerbation of CHF.  He denies fevers or chills.  Does have diffuse body aches.  Has been eating and drinking okay.  He actually feels today like his symptoms are somewhat improving but did feel dyspnea on exertion with chest pressure this morning.  He has been compliant with his Lasix and is actually increased his dose and doubled it for the last 2 days and says his urine output has been less has gained about 5 pounds.  Denies abdominal pain nausea vomiting or diarrhea no sick contacts.  Patient has history of CHF.  Last EF during inpatient admission November 2022 showed EF of 25 to 30%.  He had cardiac stress test and January of this year and had PCI to RCA was started on Xarelto and Plavix after this.     Past Medical History:  Diagnosis Date   Arthritis    lower back   Back pain    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    Complication of anesthesia    Violent with hallucinations after procedure 10/2015   DDD (degenerative disc disease), lumbar    Duodenal ulcer    Elevated lipids    GERD (gastroesophageal reflux disease)    Leg weakness, bilateral    s/p lumbar surgery   PAD (peripheral artery disease) (HCC)    Polyradiculitis    Stroke ([place])    Rt lower leg  estimated 10 - 12  stokes in last 5 years   Wears dentures    full upper and lower    Patient Active Problem List   Diagnosis Date Noted   Atherosclerosis of native arteries of extremity with intermittent claudication ([place]) 71/16/5790   Acute systolic (congestive) heart failure ([place]) 01/31/2021   Melena 01/30/2021   Anemia 01/30/2021   Shortness of breath 01/30/2021   Acute respiratory failure with hypoxia ([place]) 01/29/2021   Stage 3b chronic kidney disease ([place]) 12/31/2018   CVA (cerebral vascular accident) ([place]) 02/26/2018   Tubular adenoma 10/29/2016   Bilateral carotid artery stenosis 04/10/2016   Hyperlipidemia 01/01/2016   B12 deficiency 12/29/2015   Weakness of right lower extremity 09/19/2015   Tobacco abuse counseling 09/19/2015   Peripheral vascular disease ([place]) 09/19/2015   Leukocytosis 09/19/2015   CVA (cerebral infarction) 09/19/2015   Abnormal findings-gastrointestinal tract    Gastritis    Duodenal ulcer disease    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 08/29/2015   Contact with and (suspected ) exposure to other hazardous substances 01/27/2014   Exposure to hazardous material 01/27/2014   Degeneration of intervertebral disc of lumbar region 11/12/2013   Degeneration of intervertebral disc of lumbosacral region 11/12/2013   Neuritis or radiculitis due to rupture of lumbar intervertebral disc 11/12/2013   Thoracic neuritis 11/12/2013  Benign essential HTN 02/01/2013     Physical Exam  Triage Vital Signs: ED Triage Vitals  Enc Vitals Group     BP 10/29/21 1137 (!) 193/85     Pulse Rate 10/29/21 1137 86     Resp 10/29/21 1137 17     Temp 10/29/21 1137 98.7 F (37.1 C)     Temp Source 10/29/21 1137 Oral     SpO2 10/29/21 1137 96 %     Weight 10/29/21 1138 187 lb 6.3 oz (85 kg)     Height 10/29/21 1138 _0  (1.854 m)     Head Circumference --      Peak Flow --      Pain Score 10/29/21 1138 0     Pain Loc --      Pain Edu? --      Excl. in [place]? --     Most recent vital  signs: Vitals:   10/29/21 1137 10/29/21 1600  BP: (!) 193/85 (!) 187/96  Pulse: 86 79  Resp: 17 18  Temp: 98.7 F (37.1 C)   SpO2: 96% 98%     General: Awake, no distress.  CV:  Good peripheral perfusion.  Trace pitting edema bilateral lower extremities Resp:  Normal effort.  No increased work of breathing lungs overall sound clear no wheezing Abd:  No distention.  Neuro:             Awake, Alert, Oriented x 3  Other:    ED Results / Procedures / Treatments  Labs (all labs ordered are listed, but only abnormal results are displayed) Labs Reviewed  CBC WITH DIFFERENTIAL/PLATELET - Abnormal; Notable for the following components:      Result Value   WBC 12.0 (*)    Hemoglobin 12.9 (*)    RDW 15.6 (*)    Neutro Abs 10.0 (*)    Monocytes Absolute 1.1 (*)    All other components within normal limits  BASIC METABOLIC PANEL - Abnormal; Notable for the following components:   Glucose, Bld 111 (*)    BUN 46 (*)    Creatinine, Ser 3.49 (*)    GFR, Estimated 18 (*)    All other components within normal limits  BRAIN NATRIURETIC PEPTIDE - Abnormal; Notable for the following components:   B Natriuretic Peptide 2,155.8 (*)    All other components within normal limits  TROPONIN I (HIGH SENSITIVITY) - Abnormal; Notable for the following components:   Troponin I (High Sensitivity) 108 (*)    All other components within normal limits  TROPONIN I (HIGH SENSITIVITY) - Abnormal; Notable for the following components:   Troponin I (High Sensitivity) 104 (*)    All other components within normal limits  SARS CORONAVIRUS 2 BY RT PCR     EKG EKG interpreted myself shows sinus rhythm with normal axis PVCs, T wave inversion in lead V2    RADIOLOGY Chest x-ray interpreted myself shows small bilateral pleural effusions   PROCEDURES:  Critical Care performed: Yes, see critical care procedure note(s)  Procedures  The patient is on the cardiac monitor to evaluate for evidence of  arrhythmia and/or significant heart rate changes.   MEDICATIONS ORDERED IN ED: Medications  albuterol (PROVENTIL) (2.5 MG/3ML) 0.083% nebulizer solution 2.5 mg (has no administration in time range)  furosemide (LASIX) injection 40 mg (has no administration in time range)  aspirin chewable tablet 324 mg (324 mg Oral Given 10/29/21 1602)     IMPRESSION / MDM / ASSESSMENT AND PLAN /  ED COURSE  I reviewed the triage vital signs and the nursing notes.                              Patient's presentation is most consistent with acute presentation with potential threat to life or bodily function.  Differential diagnosis includes, but is not limited to, exacerbation of CHF, cardiac ischemia, viral illness including COVID-19, pneumonia  The patient is 71 year old male with complex cardiac history presenting with dyspnea on exertion and chest pressure cough.  Symptoms have been going on for about 3 days.  He has also had decreased urine output and weight gain.  Tells me he feels like he did when he had a CHF exacerbation last year.  Was admitted in November of last year found to have EF of about 25% and was diuresed at that time requiring 4 L nasal cannula.  Today he is not hypoxic but is quite hypertensive.  He looks well does not appear grossly volume overloaded.  He has bilateral small pleural effusions on chest x-ray troponin notably 108, was in the 100s last year as well but this was during a CHF exacerbation.  He did have exertional chest pain today.  Has dry cough no fevers chills.  Unclear if this is CHF versus viral illness but with his exertional symptoms I am inclined to think this is potentially cardiac.  He is certainly high risk.  Will give aspirin and repeat troponin check BNP.  BNP is quite elevated over 2000, repeat troponin stably elevated.  Patient has no oxygen requirement currently.  Given that he is having exertional chest pain today with evidence of acute CHF I think that he is best  served by admission to the hospital with observation and diuresis.  We will give IV Lasix.  I discussed with the admitting physician.       FINAL CLINICAL IMPRESSION(S) / ED DIAGNOSES   Final diagnoses:  Acute on chronic congestive heart failure, unspecified heart failure type ([place])     Rx / DC Orders   ED Discharge Orders     None        Note:  This document was prepared using Dragon voice recognition software and may include unintentional dictation errors.   Rada Hay, MD 10/29/21 [phone]

## 2021-10-29 NOTE — Hospital Course (Signed)
Gary Mccoy is a 71 year old male with history of CAD, PAD status post aortobilateral femoral artery bypass in 2017, bilateral carotid artery stenosis,, pulmonary hypertension, combined heart failure, hypertension, CKD stage IV, dilated cardiomyopathy, hyperlipidemia, PAD, who presents emergency department for chief concerns of shortness of breath, orthopnea, dry cough and decreased urine output for 2 days.  Initial vitals in the emergency department showed temperature of 98.7, respiration rate of 17, heart rate of 86, blood pressure 193/85, improved to 175 over 96, SPO2 96% on room air.  Serum sodium is 138, potassium 4.6, chloride of 106, bicarb 23, BUN of 46, serum creatinine of 3.49, GFR of 18, nonfasting blood glucose 111, WBC of 12, hemoglobin 12.9, platelets of 163.  BNP was elevated at 2155.8.  High sensitive troponin was 108 and decreased to 104.  COVID PCR was negative.  ED treatment: Aspirin 324 mg p.o. one-time dose, furosemide 40 mg IV one-time dose.  8/15: UA was not consistent with UTI, procalcitonin remain negative.  Good urinary output with 2 doses of Lasix.  COVID-19 PCR negative.  Some improvement in renal function and getting closer to baseline.  Blood pressure elevated Increasing the dose of hydralazine to 3 times daily instead of twice daily. Adding Lasix 40 mg daily. Echocardiogram was normal and troponin were trending down. Clinically appears euvolemic. Troponin mildly elevated initially most likely secondary to demand ischemia.  Patient is being discharged and advised to have a close follow-up with his nephrologist and cardiologist for further recommendations.

## 2021-10-29 NOTE — Assessment & Plan Note (Addendum)
-  Baseline hemoglobin has been 9.6-10.8, on admission it is 12.9 - Continue home iron supplementation

## 2021-10-29 NOTE — Assessment & Plan Note (Signed)
-  Presumed secondary to acute heart failure exacerbation, treat per primary problem - Complete echo ordered

## 2021-10-29 NOTE — H&P (Addendum)
History and Physical   Gary Mccoy. CZY:[id number] DOB: [birth year] DOA: 10/29/2021  PCP: Rusty Aus, MD  Outpatient Specialists: Dr. Candiss Norse, nephrology Patient coming from: home  I have personally briefly reviewed patient's old medical records in [place].  Chief Concern: Shortness of breath  HPI: [name] is a 71 year old male with history of CAD, PAD status post aortobilateral femoral artery bypass in 2017, bilateral carotid artery stenosis,, pulmonary hypertension, combined heart failure, hypertension, CKD stage IV, dilated cardiomyopathy, hyperlipidemia, PAD, who presents emergency department for chief concerns of shortness of breath, dry cough and decreased urine output for 2 days.  Initial vitals in the emergency department showed temperature of 98.7, respiration rate of 17, heart rate of 86, blood pressure 193/85, improved to 175 over 96, SPO2 96% on room air.  Serum sodium is 138, potassium 4.6, chloride of 106, bicarb 23, BUN of 46, serum creatinine of 3.49, GFR of 18, nonfasting blood glucose 111, WBC of 12, hemoglobin 12.9, platelets of 163.  BNP was elevated at 2155.8.  High sensitive troponin was 108 and decreased to 104.  COVID PCR was negative.  ED treatment: Aspirin 324 mg p.o. one-time dose, furosemide 40 mg IV one-time dose.  At bedside, he is able to tell me his name, age, current location. He knows the current year. He does not appear to be in acute distress.   He endorses worsening shortness of breath over the last three days.  He reports the shortness of breath is worse with exertion and with laying flat.  He denies known weight gain.  He denies swelling in his lower extremities.  He reports the symptoms feel similar to his prior episodes of heart failure exacerbation except for on this hospitalization he reports negative for lower extremity swelling.  Social history: he is a current tobacco user, smoking 1/2 ppd, started 50 years ago.  He is not ready to quit smoking. He denies etoh and recreational drug use. 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  ROS: Constitutional: no weight change, no fever ENT/Mouth: no sore throat, no rhinorrhea Eyes: no eye pain, no vision changes Cardiovascular: no chest pain, + dyspnea,  no edema, no palpitations Respiratory: no cough, no sputum, no wheezing Gastrointestinal: no nausea, no vomiting, no diarrhea, no constipation Genitourinary: no urinary incontinence, no dysuria, no hematuria Musculoskeletal: no arthralgias, no myalgias Skin: no skin lesions, no pruritus, Neuro: + weakness, no loss of consciousness, no syncope Psych: no anxiety, no depression, no decrease appetite Heme/Lymph: no bruising, no bleeding  ED Course: Discussed with emergency medicine provider, patient requiring hospitalization for chief concerns of shortness of breath, suspect heart failure exacerbation.  Assessment/Plan  Principal Problem:   Acute exacerbation of CHF (congestive heart failure) (HCC) Active Problems:   Shortness of breath   Benign essential HTN   Peripheral vascular disease (HCC)   Leukocytosis   Hyperlipidemia   CVA (cerebral vascular accident) ([place])   Anemia   CKD (chronic kidney disease) stage 4, GFR 15-29 ml/min (HCC)   AKI (acute kidney injury) ([place])   Assessment and Plan:  * Acute exacerbation of CHF (congestive heart failure) (HCC) - Strict I's and O's - Complete echo ordered - Status post furosemide 40 mg IV one-time dose per EDP - Additional furosemide 40 mg IV one-time dose ordered for 10/30/2021 - AM team to consult cardiology pending complete echo - Admit to telemetry cardiac, observation  Shortness  of breath - Presumed secondary to acute heart failure exacerbation, treat per primary problem - Complete echo ordered  AKI (acute kidney injury) ([place]) - With decreased urine output - Query  cardiorenal versus UTI - Status post furosemide 40 mg IV per EDP - Check UA - BMP in a.m.  Anemia - Baseline hemoglobin has been 9.6-10.8, on admission it is 12.9 - Continue home iron supplementation  CVA (cerebral vascular accident) ([place]) - Resumed Plavix 75 mg daily and Xarelto 15 mg daily  Hyperlipidemia - Rosuvastatin 20 mg daily resumed  Leukocytosis - Check procalcitonin and UA  Peripheral vascular disease (HCC) - Resume Plavix 75 mg daily  Benign essential HTN - Amlodipine 5 mg daily, hydralazine 50 mg twice daily, isosorbide mononitrate 60 mg daily, metoprolol succinate 25 mg daily resumed  Chart reviewed.   Last echo noted on 01/30/2021 read as estimated ejection fraction of 25 to 03%, grade 2 diastolic dysfunction, global hypokinesis of the left ventricle  DVT prophylaxis: Xarelto Code Status: Full code Diet: Heart healthy Family Communication: No Disposition Plan: Pending clinical course Consults called: None at this time Admission status: Telemetry cardiac, observation  Past Medical History:  Diagnosis Date   Arthritis    lower back   Back pain    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    Complication of anesthesia    Violent with hallucinations after procedure 10/2015   DDD (degenerative disc disease), lumbar    Duodenal ulcer    Elevated lipids    GERD (gastroesophageal reflux disease)    Leg weakness, bilateral    s/p lumbar surgery   PAD (peripheral artery disease) ([place])    Polyradiculitis    Stroke ([place])    Rt lower leg  estimated 10 - 12 stokes in last 5 years   Wears dentures    full upper and lower   Past Surgical History:  Procedure Laterality Date   AORTA - BILATERAL FEMORAL ARTERY BYPASS GRAFT N/A 11/08/2015   Procedure: AORTA BIFEMORAL BYPASS GRAFT;  Surgeon: Katha Cabal, MD;  Location: [facility];  Service: Vascular;  Laterality: N/A;   BACK SURGERY  2005   CERVICAL [place]   COLONOSCOPY WITH PROPOFOL N/A 12/30/2017   Procedure:  COLONOSCOPY WITH PROPOFOL;  Surgeon: Toledo, Benay Pike, MD;  Location: [facility];  Service: Gastroenterology;  Laterality: N/A;   COLONOSCOPY WITH PROPOFOL N/A 02/02/2021   Procedure: COLONOSCOPY WITH PROPOFOL;  Surgeon: Lesly Rubenstein, MD;  Location: [facility];  Service: Endoscopy;  Laterality: N/A;  Melena   ENDARTERECTOMY FEMORAL Bilateral 11/08/2015   Procedure: ENDARTERECTOMY FEMORAL;  Surgeon: Katha Cabal, MD;  Location: [facility];  Service: Vascular;  Laterality: Bilateral;  common, SFA, and profundis  Aortic endarterectomy    ESOPHAGOGASTRODUODENOSCOPY (EGD) WITH PROPOFOL N/A 09/04/2015   Procedure: ESOPHAGOGASTRODUODENOSCOPY (EGD) WITH PROPOFOL;  Surgeon: Lucilla Lame, MD;  Location: [place];  Service: Endoscopy;  Laterality: N/A;   ESOPHAGOGASTRODUODENOSCOPY (EGD) WITH PROPOFOL N/A 02/02/2021   Procedure: ESOPHAGOGASTRODUODENOSCOPY (EGD) WITH PROPOFOL;  Surgeon: Lesly Rubenstein, MD;  Location: [facility];  Service: Endoscopy;  Laterality: N/A;  Melena   LUMBAR LAMINECTOMY  2006   RIGHT/LEFT HEART CATH AND CORONARY ANGIOGRAPHY Bilateral 03/28/2021   Procedure: RIGHT/LEFT HEART CATH AND CORONARY ANGIOGRAPHY;  Surgeon: Yolonda Kida, MD;  Location: [place] [facility];  Service: Cardiovascular;  Laterality: Bilateral;   SEPTOPLASTY N/A 09/28/2020   Procedure: SEPTOPLASTY;  Surgeon: Margaretha Sheffield, MD;  Location: [place];  Service: ENT;  Laterality: N/A;  TEE WITHOUT CARDIOVERSION N/A 09/22/2015   Procedure: TRANSESOPHAGEAL ECHOCARDIOGRAM (TEE);  Surgeon: Teodoro Spray, MD;  Location: [facility];  Service: Cardiovascular;  Laterality: N/A;   TURBINATE REDUCTION Bilateral 09/28/2020   Procedure: TURBINATE REDUCTION;  Surgeon: Margaretha Sheffield, MD;  Location: [place];  Service: ENT;  Laterality: Bilateral;   Social History:  reports that he has been smoking cigarettes. He has a 45.00 pack-year smoking history. He has never used  smokeless tobacco. He reports current alcohol use. He reports that he does not use drugs.  No Known Allergies Family History  Problem Relation Age of Onset   Heart attack Mother    Hypertension Mother    Varicose Veins Mother    Diabetes Father    Heart attack Father    Heart attack Maternal Grandmother    Stroke Maternal Grandmother    Family history: Family history reviewed and not pertinent.  Prior to Admission medications   Medication Sig Start Date End Date Taking? Authorizing Provider  albuterol (VENTOLIN HFA) 108 (90 Base) MCG/ACT inhaler Inhale 2 puffs into the lungs every 6 (six) hours as needed for wheezing or shortness of breath. 02/06/21   Nicole Kindred A, DO  amLODipine (NORVASC) 10 MG tablet Take 1 tablet (10 mg total) by mouth daily. 02/07/21   Ezekiel Slocumb, DO  aspirin EC 81 MG tablet Take 81 mg by mouth daily. Patient not taking: Reported on 05/17/2021    [provider]  clopidogrel (PLAVIX) 75 MG tablet Take 75 mg by mouth daily. 04/16/21   [provider]  Coenzyme Q10 (COQ10) 200 MG CAPS Take 200 mg by mouth daily.    [provider]  furosemide (LASIX) 20 MG tablet Take 2 tablets (40 mg total) by mouth daily as needed. for weight gain or swelling Patient taking differently: Take 20 mg by mouth daily. Additional 20 ng if needed for weight gain or swelling 02/06/21 05/07/21  Nicole Kindred A, DO  hydrALAZINE (APRESOLINE) 50 MG tablet Take 1 tablet (50 mg total) by mouth every 8 (eight) hours. 02/06/21   Ezekiel Slocumb, DO  Iron-Vitamin C (VITRON-C) 65-125 MG TABS Take 1 tablet by mouth daily.    [provider]  isosorbide mononitrate (IMDUR) 60 MG 24 hr tablet Take 1 tablet (60 mg total) by mouth daily. 02/07/21   Nicole Kindred A, DO  metoprolol succinate (TOPROL-XL) 25 MG 24 hr tablet Take 1 tablet (25 mg total) by mouth daily. 02/07/21   Ezekiel Slocumb, DO  Multiple Vitamins-Minerals (MULTIVITAMIN ADULT PO) Take 1  tablet by mouth daily. Men    [provider]  nitroGLYCERIN (NITROSTAT) 0.4 MG SL tablet Place under the tongue. 04/17/21 04/17/22  [provider]  Omega-3 Fatty Acids (FISH OIL PO) Take by mouth.    [provider]  pantoprazole (PROTONIX) 40 MG tablet Take 40 mg by mouth daily. 01/11/21 01/11/22  [provider]  Rivaroxaban (XARELTO) 15 MG TABS tablet Take 1 tablet (15 mg total) by mouth daily. 02/07/21   Nicole Kindred A, DO  rosuvastatin (CRESTOR) 40 MG tablet Take 20 mg by mouth daily.    [provider]  vitamin B-12 (CYANOCOBALAMIN) 1000 MCG tablet Take 1,000 mcg by mouth daily.    [provider]   Physical Exam: Vitals:   10/29/21 2000 10/29/21 2030 10/29/21 2102 10/29/21 2103  BP: (!) 179/89 (!) 175/84 (!) 189/143 (!) 191/96  Pulse: 82 74 91 84  Resp: 15  17  Temp:   (!) 97.5 F (36.4 C)   TempSrc:   Oral   SpO2: 96% 96% 96% 100%  Weight:  85 kg    Height:       Constitutional: appears age-appropriate, NAD, calm, comfortable Eyes: PERRL, lids and conjunctivae normal ENMT: Mucous membranes are dry. Posterior pharynx clear of any exudate or lesions. Age-appropriate dentition. Hearing appropriate Neck: normal, supple, no masses, no thyromegaly Respiratory: clear to auscultation bilaterally, no wheezing, no crackles. Normal respiratory effort. No accessory muscle use.  Cardiovascular: Regular rate and rhythm, no murmurs / rubs / gallops. No extremity edema. 2+ pedal pulses. No carotid bruits.  Abdomen: no tenderness, no masses palpated, no hepatosplenomegaly. Bowel sounds positive.  Musculoskeletal: no clubbing / cyanosis. No joint deformity upper and lower extremities. Good ROM, no contractures, no atrophy. Normal muscle tone.  Skin: no rashes, lesions, ulcers. No induration Neurologic: Sensation intact. Strength 5/5 in all 4.  Psychiatric: Normal judgment and insight. Alert and oriented x 3. Normal mood.   EKG:  independently reviewed, showing sinus rhythm with rate of 86, QTc 476  Chest x-ray on Admission: I personally reviewed and I agree with radiologist reading as below.  DG Chest 2 View  Result Date: 10/29/2021 CLINICAL DATA:  Cough and shortness of breath for 3 days EXAM: CHEST - 2 VIEW COMPARISON:  January 29, 2021 FINDINGS: Stable cardiomediastinal contours. No focal airspace opacity. Small bilateral pleural effusions. No large pneumothorax. Visualized upper abdomen is unremarkable. No acute osseous abnormality. IMPRESSION: Small bilateral pleural effusions. Electronically Signed   By: Beryle Flock M.D.   On: 10/29/2021 13:26    Labs on Admission: I have personally reviewed following labs  CBC: Recent Labs  Lab 10/29/21 1141  WBC 12.0*  NEUTROABS 10.0*  HGB 12.9*  HCT 39.0  MCV 88.0  PLT 076   Basic Metabolic Panel: Recent Labs  Lab 10/29/21 1141  NA 138  K 4.6  CL 106  CO2 23  GLUCOSE 111*  BUN 46*  CREATININE 3.49*  CALCIUM 9.0   GFR: Estimated Creatinine Clearance: 22.3 mL/min (A) (by C-G formula based on SCr of 3.49 mg/dL (H)).  Urine analysis:    Component Value Date/Time   COLORURINE YELLOW (A) 02/05/2021 0946   APPEARANCEUR CLEAR (A) 02/05/2021 0946   LABSPEC 1.018 02/05/2021 0946   PHURINE 5.0 02/05/2021 0946   GLUCOSEU NEGATIVE 02/05/2021 0946   HGBUR NEGATIVE 02/05/2021 0946   BILIRUBINUR NEGATIVE 02/05/2021 0946   KETONESUR NEGATIVE 02/05/2021 0946   PROTEINUR NEGATIVE 02/05/2021 0946   NITRITE NEGATIVE 02/05/2021 0946   LEUKOCYTESUR NEGATIVE 02/05/2021 0946   Dr. Tobie Poet [facility]  If 7PM-7AM, please contact overnight-coverage provider If 7AM-7PM, please contact day coverage provider [email or web address]  10/29/2021, 10:19 PM

## 2021-10-29 NOTE — ED Triage Notes (Signed)
Stuffy nose, congestion, sob started 3 days prior

## 2021-10-29 NOTE — Assessment & Plan Note (Signed)
-  Rosuvastatin 20 mg daily resumed

## 2021-10-29 NOTE — Assessment & Plan Note (Signed)
-  Check procalcitonin and UA

## 2021-10-29 NOTE — Assessment & Plan Note (Signed)
-  Resumed Plavix 75 mg daily and Xarelto 15 mg daily

## 2021-10-29 NOTE — Assessment & Plan Note (Addendum)
-  Strict I's and O's - Complete echo ordered - Status post furosemide 40 mg IV one-time dose per EDP - Additional furosemide 40 mg IV one-time dose ordered for 10/30/2021 - AM team to consult cardiology pending complete echo - Admit to telemetry cardiac, observation

## 2021-10-29 NOTE — ED Notes (Signed)
Assumed care, pt in NAD reports feeling "better" since arriving to ED. Reports unable lay flat yesterday but has since resolved. Pt states chest heaviness with cough. Denies fevers.

## 2021-10-29 NOTE — Assessment & Plan Note (Signed)
-  Amlodipine 5 mg daily, hydralazine 50 mg twice daily, isosorbide mononitrate 60 mg daily, metoprolol succinate 25 mg daily resumed

## 2021-10-30 ENCOUNTER — Observation Stay
Admit: 2021-10-30 | Discharge: 2021-10-30 | Disposition: A | Discharge status: Home / Self Care | Payer: Medicare HMO | Attending: Internal Medicine | Admitting: Internal Medicine

## 2021-10-30 ENCOUNTER — Other Ambulatory Visit: Payer: Self-pay

## 2021-10-30 DIAGNOSIS — I509 Heart failure, unspecified: Secondary | ICD-10-CM | POA: Diagnosis not present

## 2021-10-30 DIAGNOSIS — R0600 Dyspnea, unspecified: Secondary | ICD-10-CM | POA: Diagnosis not present

## 2021-10-30 LAB — ECHOCARDIOGRAM COMPLETE
AR max vel: 2.1 cm2
AV Area VTI: 2.33 cm2
AV Area mean vel: 2.22 cm2
AV Mean grad: 3 mmHg
AV Peak grad: 5.4 mmHg
Ao pk vel: 1.16 m/s
Area-P 1/2: 5.06 cm2
Calc EF: 53.9 %
Height: 73 in
S' Lateral: 2.7 cm
Single Plane A2C EF: 48 %
Single Plane A4C EF: 58.7 %
Weight: 2998.26 oz

## 2021-10-30 LAB — URINALYSIS, COMPLETE (UACMP) WITH MICROSCOPIC
Bacteria, UA: NONE SEEN
Bilirubin Urine: NEGATIVE
Glucose, UA: NEGATIVE mg/dL
Hgb urine dipstick: NEGATIVE
Ketones, ur: NEGATIVE mg/dL
Leukocytes,Ua: NEGATIVE
Nitrite: NEGATIVE
Protein, ur: NEGATIVE mg/dL
Specific Gravity, Urine: 1.005 (ref 1.005–1.030)
Squamous Epithelial / HPF: NONE SEEN (ref 0–5)
pH: 5 (ref 5.0–8.0)

## 2021-10-30 LAB — BASIC METABOLIC PANEL
Anion gap: 10 (ref 5–15)
BUN: 50 mg/dL — ABNORMAL HIGH (ref 8–23)
CO2: 20 mmol/L — ABNORMAL LOW (ref 22–32)
Calcium: 8.5 mg/dL — ABNORMAL LOW (ref 8.9–10.3)
Chloride: 107 mmol/L (ref 98–111)
Creatinine, Ser: 2.98 mg/dL — ABNORMAL HIGH (ref 0.61–1.24)
GFR, Estimated: 22 mL/min — ABNORMAL LOW (ref >60)
Glucose, Bld: 92 mg/dL (ref 70–99)
Potassium: 3.8 mmol/L (ref 3.5–5.1)
Sodium: 137 mmol/L (ref 135–145)

## 2021-10-30 LAB — PROCALCITONIN: Procalcitonin: <0.1 ng/mL

## 2021-10-30 LAB — CBC
HCT: 32.6 % — ABNORMAL LOW (ref 39.0–52.0)
Hemoglobin: 11.1 g/dL — ABNORMAL LOW (ref 13.0–17.0)
MCH: 28.9 pg (ref 26.0–34.0)
MCHC: 34 g/dL (ref 30.0–36.0)
MCV: 84.9 fL (ref 80.0–100.0)
Platelets: 149 10*3/uL — ABNORMAL LOW (ref 150–400)
RBC: 3.84 MIL/uL — ABNORMAL LOW (ref 4.22–5.81)
RDW: 15.1 % (ref 11.5–15.5)
WBC: 9.4 10*3/uL (ref 4.0–10.5)
nRBC: 0 % (ref 0.0–0.2)

## 2021-10-30 LAB — TROPONIN I (HIGH SENSITIVITY)
Troponin I (High Sensitivity): 86 ng/L — ABNORMAL HIGH (ref <18)
Troponin I (High Sensitivity): 76 ng/L — ABNORMAL HIGH (ref <18)

## 2021-10-30 MED ORDER — PERFLUTREN LIPID MICROSPHERE
1.0000 mL | INTRAVENOUS | Status: AC | PRN
  Administered 2021-10-30: 2 mL via INTRAVENOUS

## 2021-10-30 MED ORDER — FUROSEMIDE 40 MG PO TABS: 40.0000 mg | ORAL_TABLET | Freq: Every day | ORAL | 2 refills | Status: DC

## 2021-10-30 MED ORDER — FUROSEMIDE 10 MG/ML IJ SOLN
40.0000 mg | Freq: Two times a day (BID) | INTRAMUSCULAR | Status: DC
  Administered 2021-10-30: 40 mg via INTRAVENOUS
  Filled 2021-10-30: qty 4

## 2021-10-30 MED ORDER — HYDRALAZINE HCL 50 MG PO TABS
50.0000 mg | ORAL_TABLET | Freq: Three times a day (TID) | ORAL | Status: DC
  Administered 2021-10-30: 50 mg via ORAL
  Filled 2021-10-30: qty 1

## 2021-10-30 NOTE — Progress Notes (Signed)
Nutrition Brief Note  RD pulled to chart to due to CHF admission.   Wt Readings from Last 15 Encounters:  10/29/21 85 kg  08/03/21 84.8 kg  05/24/21 83.7 kg  04/19/21 84 kg  03/28/21 81.6 kg  03/21/21 85.3 kg  02/06/21 81.4 kg  09/28/20 89.4 kg  04/15/19 93.4 kg  04/13/18 92.7 kg  02/26/18 89.4 kg  12/30/17 92.5 kg  04/11/17 87.9 kg  04/10/16 89.4 kg  01/01/16 86.2 kg   Pt with history of CAD, PAD status post aortobilateral femoral artery bypass in 2017, bilateral carotid artery stenosis,, pulmonary hypertension, combined heart failure, hypertension, CKD stage IV, dilated cardiomyopathy, hyperlipidemia, PAD, who presents for chief concerns of shortness of breath, dry cough and decreased urine output for 2 days.  Pt admitted with acute exacerbation of CHF.   Pt resting quietly in bed at time of visit.   Reviewed wt hx; wt has been stable over the past 6 months.   Medications reviewed and include vitamin B-12.   Nutrition-Focused physical exam completed. Findings are no fat depletion, no muscle depletion, and mild edema.    Current diet order is Heart Healthy (liberalize to 2 gram sodium), patient is consuming approximately n/a% of meals at this time. Labs and medications reviewed.   No nutrition interventions warranted at this time. If nutrition issues arise, please consult RD.   Loistine Chance, RD, LDN, [place] Registered Dietitian II Certified Diabetes Care and Education Specialist Please refer to [facility] for RD and/or RD on-call/weekend/after hours pager

## 2021-10-30 NOTE — Progress Notes (Signed)
*  PRELIMINARY RESULTS* Echocardiogram 2D Echocardiogram has been performed.  Sherrie Sport 10/30/2021, 8:51 AM

## 2021-10-30 NOTE — TOC CM/SW Note (Signed)
Patient has orders to discharge home today. Chart reviewed. PCP is Emily Filbert, MD. On room air. No wounds. No TOC needs identified.  Dayton Scrape, [place]

## 2021-10-30 NOTE — Consult Note (Addendum)
   Heart Failure Nurse Navigator Note  HFimEF 55 to 60%.  In November 2022 ejection fraction recorded at 25 to 30% with grade 2 diastolic dysfunction.  He presented to the emergency room with complaints of increasing shortness of breath, a cough, decreased urine output for 2 days and increasing abdominal girth.  Pressure was 193/85.  BNP was 2155.  Comorbidities:  Coronary artery disease Peripheral arterial disease Pulmonary hypertension Hypertension Chronic kidney disease stage IV Hyperlipidemia  Continued tobacco abuse  Medications:  Amlodipine 5 mg 2 times a day Plavix 75 mg daily Furosemide 40 mg 2 times a day IV Hydralazine 50 mg every 8 hours Isosorbide mononitrate 60 mg daily Metoprolol succinate 25 mg daily NicoDerm patch daily Xarelto 15 mg daily Crestor 20 mg daily  Labs:  Sodium 137, potassium 3.8, chloride 107, CO2 20, BUN 50, creatinine 2.98, estimated GFR 22. Weight 85 kg Blood pressure 165/82 Intake not documented Output 1760 mL  Initial meeting with patient.  He is lying in bed in no acute distress.  No family members present.  Discussed heart failure and what it means.  He described as the pump is not working like it should- can not deliver oxygen and blood.  Discussed the importance of daily weights, recording and what to report.  Also went over changes in symptoms to report.  Also discussed fluid restriction along with sodium restriction.  He states he completed the cardiac rehab sessions and a lot of this he was told there.  Made aware of the appointment in the heart failure clinic which is September 8 at 11:30 AM.  He has a 6% no show, 6/102 appointments missed.  He was given info on [employer] and discussed program.  Would like to discuss it with his wife.  He was given the living with heart failure teaching booklet, info on low sodium and heart failure and weight chart.  He had no further questions.  [name] RN  CHFN

## 2021-10-30 NOTE — Discharge Summary (Signed)
Physician Discharge Summary   Patient: Gary Mccoy. MRN: [id number] DOB: 1950/10/04  Admit date:     10/29/2021  Discharge date: 10/30/21  Discharge Physician: Lorella Nimrod   PCP: Rusty Aus, MD   Recommendations at discharge:  Please obtain BMP in 2 to 3 days Follow-up with nephrology towards the end of this week Follow-up with cardiology within a week Follow-up with primary care provider  Discharge Diagnoses: Principal Problem:   Acute exacerbation of CHF (congestive heart failure) ([place]) Active Problems:   Shortness of breath   Benign essential HTN   Peripheral vascular disease (HCC)   Leukocytosis   Hyperlipidemia   CVA (cerebral vascular accident) ([place])   Anemia   CKD (chronic kidney disease) stage 4, GFR 15-29 ml/min (HCC)   AKI (acute kidney injury) [facility])   Hospital Course: Gary Mccoy is a 71 year old male with history of CAD, PAD status post aortobilateral femoral artery bypass in 2017, bilateral carotid artery stenosis,, pulmonary hypertension, combined heart failure, hypertension, CKD stage IV, dilated cardiomyopathy, hyperlipidemia, PAD, who presents emergency department for chief concerns of shortness of breath, orthopnea, dry cough and decreased urine output for 2 days.  Initial vitals in the emergency department showed temperature of 98.7, respiration rate of 17, heart rate of 86, blood pressure 193/85, improved to 175 over 96, SPO2 96% on room air.  Serum sodium is 138, potassium 4.6, chloride of 106, bicarb 23, BUN of 46, serum creatinine of 3.49, GFR of 18, nonfasting blood glucose 111, WBC of 12, hemoglobin 12.9, platelets of 163.  BNP was elevated at 2155.8.  High sensitive troponin was 108 and decreased to 104.  COVID PCR was negative.  ED treatment: Aspirin 324 mg p.o. one-time dose, furosemide 40 mg IV one-time dose.  8/15: UA was not consistent with UTI, procalcitonin remain negative.  Good urinary output with 2 doses of Lasix.   COVID-19 PCR negative.  Some improvement in renal function and getting closer to baseline.  Blood pressure elevated Increasing the dose of hydralazine to 3 times daily instead of twice daily. Adding Lasix 40 mg daily. Echocardiogram was normal and troponin were trending down. Clinically appears euvolemic. Troponin mildly elevated initially most likely secondary to demand ischemia.  Patient is being discharged and advised to have a close follow-up with his nephrologist and cardiologist for further recommendations.  Assessment and Plan: * Acute exacerbation of CHF (congestive heart failure) (HCC) - Strict I's and O's - Complete echo ordered - Status post furosemide 40 mg IV one-time dose per EDP - Additional furosemide 40 mg IV one-time dose ordered for 10/30/2021 - AM team to consult cardiology pending complete echo - Admit to telemetry cardiac, observation  Shortness of breath - Presumed secondary to acute heart failure exacerbation, treat per primary problem - Complete echo ordered  AKI (acute kidney injury) ([place]) - With decreased urine output - Query cardiorenal versus UTI - Status post furosemide 40 mg IV per EDP - Check UA - BMP in a.m.  Anemia - Baseline hemoglobin has been 9.6-10.8, on admission it is 12.9 - Continue home iron supplementation  CVA (cerebral vascular accident) ([place]) - Resumed Plavix 75 mg daily and Xarelto 15 mg daily  Hyperlipidemia - Rosuvastatin 20 mg daily resumed  Leukocytosis - Check procalcitonin and UA  Peripheral vascular disease (HCC) - Resume Plavix 75 mg daily  Benign essential HTN - Amlodipine 5 mg daily, hydralazine 50 mg twice daily, isosorbide mononitrate 60 mg daily, metoprolol succinate 25 mg daily resumed  Consultants: None Procedures performed: None Disposition: Home health Diet recommendation:  Discharge Diet Orders (From admission, onward)     Start     Ordered   10/30/21 0000  Diet - low sodium heart healthy         10/30/21 1413           Cardiac diet DISCHARGE MEDICATION: Allergies as of 10/30/2021   No Known Allergies      Medication List     TAKE these medications    albuterol 108 (90 Base) MCG/ACT inhaler Commonly known as: VENTOLIN HFA Inhale 2 puffs into the lungs every 6 (six) hours as needed for wheezing or shortness of breath.   amLODipine 10 MG tablet Commonly known as: NORVASC Take 1 tablet (10 mg total) by mouth daily. What changed:  how much to take when to take this   clopidogrel 75 MG tablet Commonly known as: PLAVIX Take 75 mg by mouth daily.   CoQ10 200 MG Caps Take 200 mg by mouth daily.   cyanocobalamin 1000 MCG tablet Commonly known as: VITAMIN B12 Take 1,000 mcg by mouth daily.   FISH OIL PO Take by mouth.   furosemide 40 MG tablet Commonly known as: LASIX Take 1 tablet (40 mg total) by mouth daily. for weight gain or swelling What changed:  medication strength when to take this reasons to take this   hydrALAZINE 50 MG tablet Commonly known as: APRESOLINE Take 1 tablet (50 mg total) by mouth every 8 (eight) hours. What changed: when to take this   isosorbide mononitrate 60 MG 24 hr tablet Commonly known as: IMDUR Take 1 tablet (60 mg total) by mouth daily.   metoprolol succinate 25 MG 24 hr tablet Commonly known as: TOPROL-XL Take 1 tablet (25 mg total) by mouth daily.   MULTIVITAMIN ADULT PO Take 1 tablet by mouth daily. Men   nitroGLYCERIN 0.4 MG SL tablet Commonly known as: NITROSTAT Place under the tongue.   pantoprazole 40 MG tablet Commonly known as: PROTONIX Take 40 mg by mouth daily.   Rivaroxaban 15 MG Tabs tablet Commonly known as: XARELTO Take 1 tablet (15 mg total) by mouth daily.   rosuvastatin 40 MG tablet Commonly known as: CRESTOR Take 20 mg by mouth daily.   Vitron-C 65-125 MG Tabs Generic drug: Iron-Vitamin C Take 1 tablet by mouth daily.        Follow-up Information     Rusty Aus, MD.  Schedule an appointment as soon as possible for a visit in 1 week(s).   Specialty: Internal Medicine Contact information: [place] [address] [phone]         Lujean Amel D, MD. Schedule an appointment as soon as possible for a visit in 1 week(s).   Specialties: Cardiology, Internal Medicine Contact information: [place] [address] [phone]                Discharge Exam: Danley Danker Weights   10/29/21 1138 10/29/21 2030  Weight: 85 kg 85 kg   General.     In no acute distress. Pulmonary.  Lungs clear bilaterally, normal respiratory effort. CV.  Regular rate and rhythm, no JVD, rub or murmur. Abdomen.  Soft, nontender, nondistended, BS positive. CNS.  Alert and oriented .  No focal neurologic deficit. Extremities.  No edema, no cyanosis, pulses intact and symmetrical. Psychiatry.  Judgment and insight appears normal.   Condition at discharge: stable  The results  of significant diagnostics from this hospitalization (including imaging, microbiology, ancillary and laboratory) are listed below for reference.   Imaging Studies: ECHOCARDIOGRAM COMPLETE  Result Date: 10/30/2021    ECHOCARDIOGRAM REPORT   Patient Name:   Gary Mccoy. Date of Exam: 10/30/2021 Medical Rec #:  [id number]             Height:       73.0 in Accession #:    [id number]            Weight:       187.4 lb Date of Birth:  December 12, 1950             BSA:          2.093 m Patient Age:    88 years              BP:           174/94 mmHg Patient Gender: M                     HR:           92 bpm. Exam Location:  [facility] Procedure: 2D Echo, Cardiac Doppler, Color Doppler and Intracardiac            Opacification Agent Indications:     Dyspnea R06.00                  Elevated troponin  History:         Patient has prior history of Echocardiogram examinations, most                  recent 01/30/2021. Stroke and PAD.   Sonographer:     Sherrie Sport Referring Phys:  [id number] AMY N COX Diagnosing Phys: Isaias Cowman MD  Sonographer Comments: Suboptimal parasternal window and suboptimal apical window. IMPRESSIONS  1. Left ventricular ejection fraction, by estimation, is 55 to 60%. The left ventricle has normal function. The left ventricle has no regional wall motion abnormalities. Left ventricular diastolic parameters were normal.  2. Right ventricular systolic function is normal. The right ventricular size is normal.  3. The mitral valve is normal in structure. Mild mitral valve regurgitation. No evidence of mitral stenosis.  4. The aortic valve is normal in structure. Aortic valve regurgitation is not visualized. No aortic stenosis is present.  5. The inferior vena cava is normal in size with greater than 50% respiratory variability, suggesting right atrial pressure of 3 mmHg. FINDINGS  Left Ventricle: Left ventricular ejection fraction, by estimation, is 55 to 60%. The left ventricle has normal function. The left ventricle has no regional wall motion abnormalities. Definity contrast agent was given IV to delineate the left ventricular  endocardial borders. The left ventricular internal cavity size was normal in size. There is no left ventricular hypertrophy. Left ventricular diastolic parameters were normal. Right Ventricle: The right ventricular size is normal. No increase in right ventricular wall thickness. Right ventricular systolic function is normal. Left Atrium: Left atrial size was normal in size. Right Atrium: Right atrial size was normal in size. Pericardium: There is no evidence of pericardial effusion. Mitral Valve: The mitral valve is normal in structure. Mild mitral valve regurgitation. No evidence of mitral valve stenosis. Tricuspid Valve: The tricuspid valve is normal in structure. Tricuspid valve regurgitation is mild . No evidence of tricuspid stenosis. Aortic Valve: The aortic valve is normal in structure.  Aortic valve regurgitation is not visualized. No aortic stenosis is present.  Aortic valve mean gradient measures 3.0 mmHg. Aortic valve peak gradient measures 5.4 mmHg. Aortic valve area, by VTI measures 2.33 cm. Pulmonic Valve: The pulmonic valve was normal in structure. Pulmonic valve regurgitation is not visualized. No evidence of pulmonic stenosis. Aorta: The aortic root is normal in size and structure. Venous: The inferior vena cava is normal in size with greater than 50% respiratory variability, suggesting right atrial pressure of 3 mmHg. IAS/Shunts: No atrial level shunt detected by color flow Doppler.  LEFT VENTRICLE PLAX 2D LVIDd:         3.80 cm      Diastology LVIDs:         2.70 cm      LV e' medial:    4.57 cm/s LV PW:         1.10 cm      LV E/e' medial:  12.3 LV IVS:        1.40 cm      LV e' lateral:   3.15 cm/s LVOT diam:     2.00 cm      LV E/e' lateral: 17.8 LV SV:         46 LV SV Index:   22 LVOT Area:     3.14 cm  LV Volumes (MOD) LV vol d, MOD A2C: 102.0 ml LV vol d, MOD A4C: 98.3 ml LV vol s, MOD A2C: 53.0 ml LV vol s, MOD A4C: 40.6 ml LV SV MOD A2C:     49.0 ml LV SV MOD A4C:     98.3 ml LV SV MOD BP:      55.5 ml RIGHT VENTRICLE RV Basal diam:  4.40 cm RV S prime:     11.60 cm/s TAPSE (M-mode): 2.1 cm LEFT ATRIUM           Index        RIGHT ATRIUM           Index LA diam:      4.00 cm 1.91 cm/m   RA Area:     17.40 cm LA Vol (A2C): 82.4 ml 39.37 ml/m  RA Volume:   49.80 ml  23.79 ml/m LA Vol (A4C): 24.7 ml 11.80 ml/m  AORTIC VALVE AV Area (Vmax):    2.10 cm AV Area (Vmean):   2.22 cm AV Area (VTI):     2.33 cm AV Vmax:           116.00 cm/s AV Vmean:          73.600 cm/s AV VTI:            0.198 m AV Peak Grad:      5.4 mmHg AV Mean Grad:      3.0 mmHg LVOT Vmax:         77.70 cm/s LVOT Vmean:        52.100 cm/s LVOT VTI:          0.147 m LVOT/AV VTI ratio: 0.74  AORTA Ao Root diam: 2.90 cm MITRAL VALVE               TRICUSPID VALVE MV Area (PHT): 5.06 cm    TR Peak grad:   15.7  mmHg MV Decel Time: 150 msec    TR Vmax:        198.00 cm/s MV E velocity: 56.10 cm/s MV A velocity: 95.50 cm/s  SHUNTS MV E/A ratio:  0.59        Systemic VTI:  0.15 m  Systemic Diam: 2.00 cm Isaias Cowman MD Electronically signed by Isaias Cowman MD Signature Date/Time: 10/30/2021/9:15:51 AM    Final    DG Chest 2 View  Result Date: 10/29/2021 CLINICAL DATA:  Cough and shortness of breath for 3 days EXAM: CHEST - 2 VIEW COMPARISON:  January 29, 2021 FINDINGS: Stable cardiomediastinal contours. No focal airspace opacity. Small bilateral pleural effusions. No large pneumothorax. Visualized upper abdomen is unremarkable. No acute osseous abnormality. IMPRESSION: Small bilateral pleural effusions. Electronically Signed   By: Beryle Flock M.D.   On: 10/29/2021 13:26    Microbiology: Results for orders placed or performed during the hospital encounter of 10/29/21  SARS Coronavirus 2 by RT PCR (hospital order, performed in [facility] hospital lab) *cepheid single result test* Anterior Nasal Swab     Status: None   Collection Time: 10/29/21  2:59 PM   Specimen: Anterior Nasal Swab  Result Value Ref Range Status   SARS Coronavirus 2 by RT PCR NEGATIVE NEGATIVE Final    Comment: (NOTE) SARS-CoV-2 target nucleic acids are NOT DETECTED.  The SARS-CoV-2 RNA is generally detectable in upper and lower respiratory specimens during the acute phase of infection. The lowest concentration of SARS-CoV-2 viral copies this assay can detect is 250 copies / mL. A negative result does not preclude SARS-CoV-2 infection and should not be used as the sole basis for treatment or other patient management decisions.  A negative result may occur with improper specimen collection / handling, submission of specimen other than nasopharyngeal swab, presence of viral mutation(s) within the areas targeted by this assay, and inadequate number of viral copies (<250 copies / mL). A  negative result must be combined with clinical observations, patient history, and epidemiological information.  Fact Sheet for Patients:   https://www.patel.info/  Fact Sheet for Healthcare Providers: https://hall.com/  This test is not yet approved or  cleared by the [place] FDA and has been authorized for detection and/or diagnosis of SARS-CoV-2 by FDA under an Emergency Use Authorization (EUA).  This EUA will remain in effect (meaning this test can be used) for the duration of the COVID-19 declaration under Section 564(b)(1) of the Act, 21 U.S.C. section 360bbb-3(b)(1), unless the authorization is terminated or revoked sooner.  Performed at [facility], [address] [place], [place] [address]     Labs: CBC: Recent Labs  Lab 10/29/21 1141 10/30/21 0454  WBC 12.0* 9.4  NEUTROABS 10.0*  --   HGB 12.9* 11.1*  HCT 39.0 32.6*  MCV 88.0 84.9  PLT 163 314*   Basic Metabolic Panel: Recent Labs  Lab 10/29/21 1141 10/30/21 0454  NA 138 137  K 4.6 3.8  CL 106 107  CO2 23 20*  GLUCOSE 111* 92  BUN 46* 50*  CREATININE 3.49* 2.98*  CALCIUM 9.0 8.5*   Liver Function Tests: No results for input(s): "AST", "ALT", "ALKPHOS", "BILITOT", "PROT", "ALBUMIN" in the last 168 hours. CBG: No results for input(s): "GLUCAP" in the last 168 hours.  Discharge time spent: greater than 30 minutes.  This record has been created using Systems analyst. Errors have been sought and corrected,but may not always be located. Such creation errors do not reflect on the standard of care.   Signed: Lorella Nimrod, MD [facility] 10/30/2021

## 2021-11-01 DIAGNOSIS — I509 Heart failure, unspecified: Secondary | ICD-10-CM | POA: Diagnosis not present

## 2021-11-01 DIAGNOSIS — I429 Cardiomyopathy, unspecified: Secondary | ICD-10-CM | POA: Diagnosis not present

## 2021-11-01 DIAGNOSIS — N184 Chronic kidney disease, stage 4 (severe): Secondary | ICD-10-CM | POA: Diagnosis not present

## 2021-11-01 DIAGNOSIS — I129 Hypertensive chronic kidney disease with stage 1 through stage 4 chronic kidney disease, or unspecified chronic kidney disease: Secondary | ICD-10-CM | POA: Diagnosis not present

## 2021-11-01 DIAGNOSIS — N261 Atrophy of kidney (terminal): Secondary | ICD-10-CM | POA: Diagnosis not present

## 2021-11-01 DIAGNOSIS — D631 Anemia in chronic kidney disease: Secondary | ICD-10-CM | POA: Diagnosis not present

## 2021-11-02 ENCOUNTER — Telehealth: Payer: Self-pay

## 2021-11-02 NOTE — Telephone Encounter (Signed)
[facility] Heart Failure Discharge Note  Call to patient after he had been discharged on August 15.  States that he feels that he is doing well other than he remains tired.  Is continuing to weigh himself daily and is down a total of 7 pounds since discharge.  Discussed  his medications and the changes.  He states that he is taking the Lasix daily, he increased hydralazine from 2 doses daily  to 3 times a day and is taking 10 mg of the amlodipine daily.  He has follow-up with Dr. Clayborn Bigness on September 1.  He then has follow-up with the outpatient heart failure clinic on September 8 at 11:30 AM.  States that transportation is not a problem along with obtaining his medications is not a problem.  He states the biggest problem for him is wanting to eat things like gravy and biscuits, which is what he grew up on but knows that he should not.  Before discharge he had voiced that he was interested in the ventricle health program but had not thought of it since discharge.  He states when he comes for his appointment with Ms. Jackelyn Hoehn that he will let us know if he is interested in getting involved in that program.  He had no further questions.  [name] RN CHFN

## 2021-11-15 DIAGNOSIS — I420 Dilated cardiomyopathy: Secondary | ICD-10-CM | POA: Diagnosis not present

## 2021-11-16 DIAGNOSIS — I739 Peripheral vascular disease, unspecified: Secondary | ICD-10-CM | POA: Diagnosis not present

## 2021-11-16 DIAGNOSIS — I25118 Atherosclerotic heart disease of native coronary artery with other forms of angina pectoris: Secondary | ICD-10-CM | POA: Diagnosis not present

## 2021-11-16 DIAGNOSIS — R079 Chest pain, unspecified: Secondary | ICD-10-CM | POA: Diagnosis not present

## 2021-11-16 DIAGNOSIS — Z95820 Peripheral vascular angioplasty status with implants and grafts: Secondary | ICD-10-CM | POA: Diagnosis not present

## 2021-11-16 DIAGNOSIS — R0602 Shortness of breath: Secondary | ICD-10-CM | POA: Diagnosis not present

## 2021-11-16 DIAGNOSIS — I10 Essential (primary) hypertension: Secondary | ICD-10-CM | POA: Diagnosis not present

## 2021-11-16 DIAGNOSIS — I420 Dilated cardiomyopathy: Secondary | ICD-10-CM | POA: Diagnosis not present

## 2021-11-16 DIAGNOSIS — I5023 Acute on chronic systolic (congestive) heart failure: Secondary | ICD-10-CM | POA: Diagnosis not present

## 2021-11-16 DIAGNOSIS — Z8673 Personal history of transient ischemic attack (TIA), and cerebral infarction without residual deficits: Secondary | ICD-10-CM | POA: Diagnosis not present

## 2021-11-22 NOTE — Progress Notes (Signed)
Patient ID: Gary Cavalier., male    DOB: 10/23/1950, 71 y.o.   MRN: [id number]  HPI  Gary Mccoy is a 71 y/o male with a history of stroke, hyperlipidemia, GERD, CAD, PAD, CKD, tobacco use and chronic heart failure.   Echo report from 10/30/21 reviewed and showed an EF of 55-60% along with mild Gary  RHC/LHC done 03/28/21 and showed:   Mid LM to Dist LM lesion is 50% stenosed.   Ost RCA lesion is 70% stenosed.   Prox RCA-1 lesion is 80% stenosed.   Prox RCA-2 lesion is 80% stenosed.   Dist LAD-2 lesion is 50% stenosed.   Dist LAD-1 lesion is 50% stenosed.   1st Diag lesion is 70% stenosed.   Ost Cx lesion is 50% stenosed.   The left ventricular systolic function is normal.   LV end diastolic pressure is normal.   The left ventricular ejection fraction is 55-65% by visual estimate.   Hemodynamic findings consistent with moderate pulmonary hypertension.  Right heart cath right brachial Wedge 29/36 mean of 24 PA 53/24 mean of 38 RV 94/5 end-diastolic of 16 RA 03/88 mean of 14 LV 828/00 end-diastolic of 28 Ao 349/17 Ao sat 96% PA sat 70% Cardiac output Fick 6.38 Index 3.13 Measurement suggests moderate pulmonary hypertension  Admitted 10/29/21 due to SOB, orthopnea and dry cough. Initially given IV lasix with transition to oral diuretics. Hydralazine increased due to BP. Elevated troponin thought to be due to demand ischemia.   Discharged the next day.   He presents today for his initial visit with a chief complaint of moderate fatigue with little exertion. Describes this as chronic in nature. He has associated shortness of breath, back pain, anxious and difficulty sleeping along with this. He denies any dizziness, chest pain, pedal edema, palpitations, abdominal distention or weight gain.   Voices frustration with "every doctor changing meds, 1 makes one change and then the next one changes something else". Says that he's not depressed nor suicidal but says that he's " not afraid  to die" and doesn't feel like he's doing anything but "taking up air".   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   Has not had his 2nd dose of hydralazine yet today.   Past Medical History:  Diagnosis Date   Arthritis    lower back   Back pain    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    CHF (congestive heart failure) (HCC)    Chronic kidney disease    Complication of anesthesia    Violent with hallucinations after procedure 10/2015   Coronary artery disease    DDD (degenerative disc disease), lumbar    Duodenal ulcer    Elevated lipids    GERD (gastroesophageal reflux disease)    Leg weakness, bilateral    s/p lumbar surgery   PAD (peripheral artery disease) ([place])    Polyradiculitis    Stroke ([place])    Rt lower leg  estimated 10 - 12 stokes in last 5 years   Wears dentures    full upper and lower   Past Surgical History:  Procedure Laterality Date   AORTA - BILATERAL FEMORAL ARTERY BYPASS GRAFT N/A 11/08/2015   Procedure: AORTA BIFEMORAL BYPASS GRAFT;  Surgeon: Katha Cabal, MD;  Location: [facility];  Service: Vascular;  Laterality: N/A;   [place]  COLONOSCOPY WITH PROPOFOL N/A 12/30/2017   Procedure: COLONOSCOPY WITH PROPOFOL;  Surgeon: Toledo, Benay Pike, MD;  Location: [facility];  Service: Gastroenterology;  Laterality: N/A;   COLONOSCOPY WITH PROPOFOL N/A 02/02/2021   Procedure: COLONOSCOPY WITH PROPOFOL;  Surgeon: Lesly Rubenstein, MD;  Location: [facility];  Service: Endoscopy;  Laterality: N/A;  [place]   ENDARTERECTOMY FEMORAL Bilateral 11/08/2015   Procedure: ENDARTERECTOMY FEMORAL;  Surgeon: Katha Cabal, MD;  Location: [facility];  Service: Vascular;  Laterality: Bilateral;  common, SFA, and profundis  Aortic endarterectomy    ESOPHAGOGASTRODUODENOSCOPY (EGD) WITH PROPOFOL N/A 09/04/2015   Procedure: ESOPHAGOGASTRODUODENOSCOPY  (EGD) WITH PROPOFOL;  Surgeon: Lucilla Lame, MD;  [facility];  Service: Endoscopy;  Laterality: N/A;   ESOPHAGOGASTRODUODENOSCOPY (EGD) WITH PROPOFOL N/A 02/02/2021   Procedure: ESOPHAGOGASTRODUODENOSCOPY (EGD) WITH PROPOFOL;  Surgeon: Lesly Rubenstein, MD;  Location: [facility];  Service: Endoscopy;  Laterality: N/A;  [place]   LUMBAR LAMINECTOMY  2006   RIGHT/LEFT HEART CATH AND CORONARY ANGIOGRAPHY Bilateral 03/28/2021   Procedure: RIGHT/LEFT HEART CATH AND CORONARY ANGIOGRAPHY;  Surgeon: Yolonda Kida, MD;  Location: [place] [facility];  Service: Cardiovascular;  Laterality: Bilateral;   SEPTOPLASTY N/A 09/28/2020   Procedure: SEPTOPLASTY;  Surgeon: Margaretha Sheffield, MD;  Location: [place];  Service: ENT;  Laterality: N/A;   TEE WITHOUT CARDIOVERSION N/A 09/22/2015   Procedure: TRANSESOPHAGEAL ECHOCARDIOGRAM (TEE);  Surgeon: Teodoro Spray, MD;  Location: [facility];  Service: Cardiovascular;  Laterality: N/A;   TURBINATE REDUCTION Bilateral 09/28/2020   Procedure: TURBINATE REDUCTION;  Surgeon: Margaretha Sheffield, MD;  Location: [place];  Service: ENT;  Laterality: Bilateral;   Family History  Problem Relation Age of Onset   Heart attack Mother    Hypertension Mother    Varicose Veins Mother    Diabetes Father    Heart attack Father    Heart attack Maternal Grandmother    Stroke Maternal Grandmother    Social History   Tobacco Use   Smoking status: Every Day    Packs/day: 1.00    Years: 45.00    Total pack years: 45.00    Types: Cigarettes   Smokeless tobacco: Never   Tobacco comments:    05/17/21- currently 0.5 PPD  Substance Use Topics   Alcohol use: Yes    Comment: rare 3 a year   No Known Allergies Prior to Admission medications   Medication Sig Start Date End Date Taking? Authorizing Provider  albuterol (VENTOLIN HFA) 108 (90 Base) MCG/ACT inhaler Inhale 2 puffs into the lungs every 6 (six) hours as needed for wheezing or  shortness of breath. 02/06/21  Yes Ezekiel Slocumb, DO  Cholecalciferol (VITAMIN D-1000 MAX ST) 25 MCG (1000 UT) tablet Take 2,000 Units by mouth daily.   Yes [provider]  clopidogrel (PLAVIX) 75 MG tablet Take 75 mg by mouth daily. 04/16/21  Yes [provider]  Coenzyme Q10 (COQ10) 200 MG CAPS Take 200 mg by mouth daily.   Yes [provider]  furosemide (LASIX) 40 MG tablet Take 1 tablet (40 mg total) by mouth daily. for weight gain or swelling 10/30/21 01/28/22 Yes Lorella Nimrod, MD  hydrALAZINE (APRESOLINE) 100 MG tablet Take 100 mg by mouth 3 (three) times daily.   Yes [provider]  Iron-Vitamin C (VITRON-C) 65-125 MG TABS Take 1 tablet by mouth daily.   Yes [provider]  isosorbide mononitrate (IMDUR) 120 MG 24 hr tablet Take 120 mg by mouth daily. 11/16/21  Yes [provider]  metoprolol succinate (TOPROL-XL) 25 MG 24 hr tablet Take 1 tablet (25 mg total) by mouth daily. 02/07/21  Yes Nicole Kindred A, DO  Multiple Vitamins-Minerals (MULTIVITAMIN ADULT PO) Take 1 tablet by mouth daily. Men   Yes [provider]  nitroGLYCERIN (NITROSTAT) 0.4 MG SL tablet Place under the tongue. 04/17/21 04/17/22 Yes [provider]  Omega-3 Fatty Acids (FISH OIL PO) Take by mouth.   Yes [provider]  pantoprazole (PROTONIX) 40 MG tablet Take 40 mg by mouth daily. 01/11/21 01/11/22 Yes [provider]  Rivaroxaban (XARELTO) 15 MG TABS tablet Take 1 tablet (15 mg total) by mouth daily. 02/07/21  Yes Nicole Kindred A, DO  rosuvastatin (CRESTOR) 40 MG tablet Take 20 mg by mouth daily.   Yes [provider]  vitamin B-12 (CYANOCOBALAMIN) 1000 MCG tablet Take 1,000 mcg by mouth daily.   Yes [provider]   Review of Systems  Constitutional:  Positive for fatigue. Negative for appetite change.  HENT:  Negative for congestion, postnasal drip and sore throat.   Eyes: Negative.   Respiratory:   Positive for shortness of breath. Negative for chest tightness.   Cardiovascular:  Negative for chest pain, palpitations and leg swelling.  Gastrointestinal:  Negative for abdominal distention and abdominal pain.  Endocrine: Negative.   Genitourinary: Negative.   Musculoskeletal:  Positive for back pain. Negative for neck pain.  Skin: Negative.   Allergic/Immunologic: Negative.   Neurological:  Negative for dizziness, light-headedness and headaches.  Hematological:  Negative for adenopathy. Does not bruise/bleed easily.  Psychiatric/Behavioral:  Positive for sleep disturbance (chronic difficulty). Negative for dysphoric mood and suicidal ideas. The patient is nervous/anxious.    Vitals:   11/23/21 1120  BP: (!) 163/75  Pulse: 66  Resp: 14  SpO2: 100%  Weight: 181 lb 6 oz (82.3 kg)  Height: _0  (1.854 m)   Wt Readings from Last 3 Encounters:  11/23/21 181 lb 6 oz (82.3 kg)  10/29/21 187 lb 6.3 oz (85 kg)  08/03/21 187 lb (84.8 kg)   Lab Results  Component Value Date   CREATININE 2.98 (H) 10/30/2021   CREATININE 3.49 (H) 10/29/2021   CREATININE 2.43 (H) 03/28/2021   Physical Exam Vitals and nursing note reviewed.  Constitutional:      Appearance: Normal appearance.  HENT:     Head: Normocephalic and atraumatic.  Cardiovascular:     Rate and Rhythm: Normal rate and regular rhythm.  Pulmonary:     Effort: Pulmonary effort is normal. No respiratory distress.     Breath sounds: Rhonchi (clears with coughing) present. No wheezing or rales.  Abdominal:     General: There is no distension.     Palpations: Abdomen is soft.     Tenderness: There is no abdominal tenderness.  Musculoskeletal:        General: No tenderness.     Cervical back: Normal range of motion and neck supple.     Right lower leg: No edema.     Left lower leg: No edema.  Skin:    General: Skin is warm and dry.  Neurological:     General: No focal deficit present.     Mental Status: He is alert and  oriented to person, place, and time.  Psychiatric:        Mood and Affect: Mood is anxious. Affect is flat.        Behavior: Behavior normal.  Thought Content: Thought content normal.   Assessment & Plan:  1: Chronic heart failure with preserved ejection fraction- - NYHA class III - euvolemic today - weighing daily and says that his home weight stays ~ 175 pounds - not adding salt to his food and is reading food labels for sodium content - BNP 10/29/21 was 2155.8  2: HTN with CKD- - BP elevated (163/75) but was 132/80 at cardiology visit 1 week ago - has not taken his 2nd hydralazine dose yet today - saw PCP [name]) 11/01/21 - saw nephrology Candiss Norse) 11/01/21 - BMP 11/15/21 reviewed and showed sodium 140, potassium 4.3, creatinine 2.2 and GFR 30  3: Atherosclerosis- - saw cardiology Clayborn Bigness) 11/16/21 - currently taking xarelto & rosuvastatin  4: Tobacco use- - smokes 1 ppd of cigarettes  - complete cessation discussed  Emotional support given regarding his feelings of uselessness. Acknowledged his frustration with multiple providers changing medications.   Medication list reviewed.   Due to HF stability and his close follow-up with multiple providers, will not make a return appointment for patient at this time. Advised patient that he could call back at anytime for questions or to make another appointment. He was comfortable with this plan and appreciated not making another appointment right now.

## 2021-11-23 ENCOUNTER — Ambulatory Visit: Payer: Medicare HMO | Attending: Family | Admitting: Family

## 2021-11-23 ENCOUNTER — Encounter: Payer: Self-pay | Admitting: Family

## 2021-11-23 VITALS — BP 163/75 | HR 66 | Resp 14 | Ht 73.0 in | Wt 181.4 lb

## 2021-11-23 DIAGNOSIS — I5032 Chronic diastolic (congestive) heart failure: Secondary | ICD-10-CM | POA: Diagnosis not present

## 2021-11-23 DIAGNOSIS — F17210 Nicotine dependence, cigarettes, uncomplicated: Secondary | ICD-10-CM | POA: Insufficient documentation

## 2021-11-23 DIAGNOSIS — R0602 Shortness of breath: Secondary | ICD-10-CM | POA: Diagnosis not present

## 2021-11-23 DIAGNOSIS — I2510 Atherosclerotic heart disease of native coronary artery without angina pectoris: Secondary | ICD-10-CM | POA: Diagnosis not present

## 2021-11-23 DIAGNOSIS — Z7902 Long term (current) use of antithrombotics/antiplatelets: Secondary | ICD-10-CM | POA: Insufficient documentation

## 2021-11-23 DIAGNOSIS — I10 Essential (primary) hypertension: Secondary | ICD-10-CM | POA: Diagnosis not present

## 2021-11-23 DIAGNOSIS — Z720 Tobacco use: Secondary | ICD-10-CM | POA: Diagnosis not present

## 2021-11-23 DIAGNOSIS — K219 Gastro-esophageal reflux disease without esophagitis: Secondary | ICD-10-CM | POA: Insufficient documentation

## 2021-11-23 DIAGNOSIS — I130 Hypertensive heart and chronic kidney disease with heart failure and stage 1 through stage 4 chronic kidney disease, or unspecified chronic kidney disease: Secondary | ICD-10-CM | POA: Diagnosis not present

## 2021-11-23 DIAGNOSIS — Z8673 Personal history of transient ischemic attack (TIA), and cerebral infarction without residual deficits: Secondary | ICD-10-CM | POA: Insufficient documentation

## 2021-11-23 DIAGNOSIS — I7090 Unspecified atherosclerosis: Secondary | ICD-10-CM | POA: Insufficient documentation

## 2021-11-23 DIAGNOSIS — G479 Sleep disorder, unspecified: Secondary | ICD-10-CM | POA: Insufficient documentation

## 2021-11-23 DIAGNOSIS — Z7901 Long term (current) use of anticoagulants: Secondary | ICD-10-CM | POA: Diagnosis not present

## 2021-11-23 DIAGNOSIS — M549 Dorsalgia, unspecified: Secondary | ICD-10-CM | POA: Diagnosis not present

## 2021-11-23 DIAGNOSIS — I739 Peripheral vascular disease, unspecified: Secondary | ICD-10-CM | POA: Insufficient documentation

## 2021-11-23 DIAGNOSIS — Z79899 Other long term (current) drug therapy: Secondary | ICD-10-CM | POA: Diagnosis not present

## 2021-11-23 DIAGNOSIS — N189 Chronic kidney disease, unspecified: Secondary | ICD-10-CM | POA: Insufficient documentation

## 2021-11-23 DIAGNOSIS — I70213 Atherosclerosis of native arteries of extremities with intermittent claudication, bilateral legs: Secondary | ICD-10-CM | POA: Diagnosis not present

## 2021-11-23 DIAGNOSIS — E785 Hyperlipidemia, unspecified: Secondary | ICD-10-CM | POA: Insufficient documentation

## 2021-11-23 NOTE — Patient Instructions (Addendum)
Continue weighing daily and call for an overnight weight gain of 3 pounds or more or a weekly weight gain of more than 5 pounds. ? ? ?If you have voicemail, please make sure your mailbox is cleaned out so that we may leave a message and please make sure to listen to any voicemails.  ? ? ?Call us in the future if you need us for anything ?

## 2022-01-08 DIAGNOSIS — I739 Peripheral vascular disease, unspecified: Secondary | ICD-10-CM | POA: Diagnosis not present

## 2022-01-08 DIAGNOSIS — Z95828 Presence of other vascular implants and grafts: Secondary | ICD-10-CM | POA: Diagnosis not present

## 2022-01-08 DIAGNOSIS — R0602 Shortness of breath: Secondary | ICD-10-CM | POA: Diagnosis not present

## 2022-01-08 DIAGNOSIS — I25118 Atherosclerotic heart disease of native coronary artery with other forms of angina pectoris: Secondary | ICD-10-CM | POA: Diagnosis not present

## 2022-01-08 DIAGNOSIS — I420 Dilated cardiomyopathy: Secondary | ICD-10-CM | POA: Diagnosis not present

## 2022-01-08 DIAGNOSIS — I10 Essential (primary) hypertension: Secondary | ICD-10-CM | POA: Diagnosis not present

## 2022-01-08 DIAGNOSIS — I5020 Unspecified systolic (congestive) heart failure: Secondary | ICD-10-CM | POA: Diagnosis not present

## 2022-01-08 DIAGNOSIS — Z95820 Peripheral vascular angioplasty status with implants and grafts: Secondary | ICD-10-CM | POA: Diagnosis not present

## 2022-01-08 DIAGNOSIS — Z8673 Personal history of transient ischemic attack (TIA), and cerebral infarction without residual deficits: Secondary | ICD-10-CM | POA: Diagnosis not present

## 2022-01-10 DIAGNOSIS — E538 Deficiency of other specified B group vitamins: Secondary | ICD-10-CM | POA: Diagnosis not present

## 2022-01-10 DIAGNOSIS — N170 Acute kidney failure with tubular necrosis: Secondary | ICD-10-CM | POA: Diagnosis not present

## 2022-01-10 DIAGNOSIS — Z0100 Encounter for examination of eyes and vision without abnormal findings: Secondary | ICD-10-CM | POA: Diagnosis not present

## 2022-01-10 DIAGNOSIS — R7309 Other abnormal glucose: Secondary | ICD-10-CM | POA: Diagnosis not present

## 2022-01-10 DIAGNOSIS — H353132 Nonexudative age-related macular degeneration, bilateral, intermediate dry stage: Secondary | ICD-10-CM | POA: Diagnosis not present

## 2022-01-10 DIAGNOSIS — H43813 Vitreous degeneration, bilateral: Secondary | ICD-10-CM | POA: Diagnosis not present

## 2022-01-10 DIAGNOSIS — I420 Dilated cardiomyopathy: Secondary | ICD-10-CM | POA: Diagnosis not present

## 2022-01-10 DIAGNOSIS — H2513 Age-related nuclear cataract, bilateral: Secondary | ICD-10-CM | POA: Diagnosis not present

## 2022-01-10 DIAGNOSIS — E782 Mixed hyperlipidemia: Secondary | ICD-10-CM | POA: Diagnosis not present

## 2022-01-10 DIAGNOSIS — Z125 Encounter for screening for malignant neoplasm of prostate: Secondary | ICD-10-CM | POA: Diagnosis not present

## 2022-01-17 DIAGNOSIS — N184 Chronic kidney disease, stage 4 (severe): Secondary | ICD-10-CM | POA: Diagnosis not present

## 2022-01-17 DIAGNOSIS — Z23 Encounter for immunization: Secondary | ICD-10-CM | POA: Diagnosis not present

## 2022-01-17 DIAGNOSIS — Z0000 Encounter for general adult medical examination without abnormal findings: Secondary | ICD-10-CM | POA: Diagnosis not present

## 2022-01-17 DIAGNOSIS — I429 Cardiomyopathy, unspecified: Secondary | ICD-10-CM | POA: Diagnosis not present

## 2022-01-29 DIAGNOSIS — I129 Hypertensive chronic kidney disease with stage 1 through stage 4 chronic kidney disease, or unspecified chronic kidney disease: Secondary | ICD-10-CM | POA: Diagnosis not present

## 2022-01-29 DIAGNOSIS — N184 Chronic kidney disease, stage 4 (severe): Secondary | ICD-10-CM | POA: Diagnosis not present

## 2022-01-29 DIAGNOSIS — D631 Anemia in chronic kidney disease: Secondary | ICD-10-CM | POA: Diagnosis not present

## 2022-02-04 DIAGNOSIS — I10 Essential (primary) hypertension: Secondary | ICD-10-CM | POA: Diagnosis not present

## 2022-02-04 DIAGNOSIS — I129 Hypertensive chronic kidney disease with stage 1 through stage 4 chronic kidney disease, or unspecified chronic kidney disease: Secondary | ICD-10-CM | POA: Diagnosis not present

## 2022-02-04 DIAGNOSIS — N1832 Chronic kidney disease, stage 3b: Secondary | ICD-10-CM | POA: Diagnosis not present

## 2022-02-04 DIAGNOSIS — D631 Anemia in chronic kidney disease: Secondary | ICD-10-CM | POA: Diagnosis not present

## 2022-02-04 DIAGNOSIS — N184 Chronic kidney disease, stage 4 (severe): Secondary | ICD-10-CM | POA: Diagnosis not present

## 2022-02-04 DIAGNOSIS — N261 Atrophy of kidney (terminal): Secondary | ICD-10-CM | POA: Diagnosis not present

## 2022-02-05 DIAGNOSIS — H2511 Age-related nuclear cataract, right eye: Secondary | ICD-10-CM | POA: Diagnosis not present

## 2022-02-15 ENCOUNTER — Encounter: Payer: Self-pay | Admitting: Ophthalmology

## 2022-02-15 DIAGNOSIS — Z9841 Cataract extraction status, right eye: Secondary | ICD-10-CM

## 2022-02-15 HISTORY — DX: Cataract extraction status, right eye: Z98.41

## 2022-02-18 NOTE — Discharge Instructions (Signed)
   Cataract Surgery, Care After ? ?This sheet gives you information about how to care for yourself after your surgery.  Your ophthalmologist may also give you more specific instructions.  If you have problems or questions, contact your doctor at  [facility], [phone]. ? ?What can I expect after the surgery? ?It is common to have: ?Itching ?Foreign body sensation (feels like a grain of sand in the eye) ?Watery discharge (excess tearing) ?Sensitivity to light and touch ?Bruising in or around the eye ?Mild blurred vision ? ?Follow these instructions at home: ?Do not touch or rub your eyes. ?You may be told to wear a protective shield or sunglasses to protect your eyes. ?Do not put a contact lens in the operative eye unless your doctor approves. ?Keep the lids and face clean and dry. ?Do not allow water to hit you directly in the face while showering. ?Keep soap and shampoo out of your eyes. ?Do not use eye makeup for 1 week. ? ?Check your eye every day for signs of infection.  Watch for: ?Redness, swelling, or pain. ?Fluid, blood or pus. ?Worsening vision. ?Worsening sensitivity to light or touch. ? ?Activity: ?During the first day, avoid bending over and reading.  You may resume reading and bending the next day. ?Do not drive or use heavy machinery for at least 24 hours. ?Avoid strenuous activities for 1 week.  Activities such as walking, treadmill, exercise bike, and climbing stairs are okay. ?Do not lift heavy (>20 pound) objects for 1 week. ?Do not do yardwork, gardening, or dirty housework (mopping, cleaning bathrooms, vacuuming, etc.) for 1 week. ?Do not swim or use a hot tub for 2 weeks. ?Ask your doctor when you can return to work. ? ?General Instructions: ?Take or apply prescription and over-the-counter medicines as directed by your doctor, including eyedrops and ointments. ?Resume medications discontinued prior to surgery, unless told otherwise by your doctor. ?Keep all follow up appointments as  scheduled. ? ?Contact a health care provider if: ?You have increased bruising around your eye. ?You have pain that is not helped with medication. ?You have a fever. ?You have fluid, pus, or blood coming from your eye or incision. ?Your sensitivity to light gets worse. ?You have spots (floaters) of flashing lights in your vision. ?You have nausea or vomiting. ? ?Go to the nearest emergency room or call 911 if: ?You have sudden loss of vision. ?You have severe, worsening eye pain. ? ?

## 2022-02-20 ENCOUNTER — Encounter: Payer: Self-pay | Admitting: Ophthalmology

## 2022-02-20 ENCOUNTER — Ambulatory Visit
Admission: RE | Admit: 2022-02-20 | Discharge: 2022-02-20 | Disposition: A | Discharge status: Home / Self Care | Payer: Medicare HMO | Attending: Ophthalmology | Admitting: Ophthalmology

## 2022-02-20 ENCOUNTER — Ambulatory Visit: Payer: Medicare HMO | Admitting: Anesthesiology

## 2022-02-20 ENCOUNTER — Encounter
Admission: RE | Disposition: A | Discharge status: Home / Self Care | Payer: Self-pay | Source: Home / Self Care | Attending: Ophthalmology

## 2022-02-20 ENCOUNTER — Other Ambulatory Visit: Payer: Self-pay

## 2022-02-20 DIAGNOSIS — I130 Hypertensive heart and chronic kidney disease with heart failure and stage 1 through stage 4 chronic kidney disease, or unspecified chronic kidney disease: Secondary | ICD-10-CM | POA: Diagnosis not present

## 2022-02-20 DIAGNOSIS — N189 Chronic kidney disease, unspecified: Secondary | ICD-10-CM | POA: Diagnosis not present

## 2022-02-20 DIAGNOSIS — I5021 Acute systolic (congestive) heart failure: Secondary | ICD-10-CM | POA: Diagnosis not present

## 2022-02-20 DIAGNOSIS — I739 Peripheral vascular disease, unspecified: Secondary | ICD-10-CM | POA: Insufficient documentation

## 2022-02-20 DIAGNOSIS — H2511 Age-related nuclear cataract, right eye: Secondary | ICD-10-CM | POA: Diagnosis not present

## 2022-02-20 DIAGNOSIS — F17210 Nicotine dependence, cigarettes, uncomplicated: Secondary | ICD-10-CM | POA: Insufficient documentation

## 2022-02-20 DIAGNOSIS — Z8711 Personal history of peptic ulcer disease: Secondary | ICD-10-CM | POA: Insufficient documentation

## 2022-02-20 DIAGNOSIS — I509 Heart failure, unspecified: Secondary | ICD-10-CM | POA: Diagnosis not present

## 2022-02-20 DIAGNOSIS — I69354 Hemiplegia and hemiparesis following cerebral infarction affecting left non-dominant side: Secondary | ICD-10-CM | POA: Insufficient documentation

## 2022-02-20 DIAGNOSIS — N184 Chronic kidney disease, stage 4 (severe): Secondary | ICD-10-CM | POA: Diagnosis not present

## 2022-02-20 DIAGNOSIS — K219 Gastro-esophageal reflux disease without esophagitis: Secondary | ICD-10-CM | POA: Insufficient documentation

## 2022-02-20 HISTORY — PX: CATARACT EXTRACTION W/PHACO: SHX586

## 2022-02-20 SURGERY — PHACOEMULSIFICATION, CATARACT, WITH IOL INSERTION
Anesthesia: Monitor Anesthesia Care | Site: Eye | Laterality: Right

## 2022-02-20 MED ORDER — ACETAMINOPHEN 325 MG PO TABS: 650.0000 mg | ORAL_TABLET | Freq: Once | ORAL | Status: DC | PRN

## 2022-02-20 MED ORDER — ACETAMINOPHEN 160 MG/5ML PO SOLN: 325.0000 mg | ORAL | Status: DC | PRN

## 2022-02-20 MED ORDER — SIGHTPATH DOSE#1 BSS IO SOLN
INTRAOCULAR | Status: DC | PRN
  Administered 2022-02-20: 15 mL

## 2022-02-20 MED ORDER — TETRACAINE HCL 0.5 % OP SOLN
1.0000 [drp] | OPHTHALMIC | Status: DC | PRN
  Administered 2022-02-20 (×3): 1 [drp] via OPHTHALMIC

## 2022-02-20 MED ORDER — CEFUROXIME OPHTHALMIC INJECTION 1 MG/0.1 ML
INJECTION | OPHTHALMIC | Status: DC | PRN
  Administered 2022-02-20: .1 mL via INTRACAMERAL

## 2022-02-20 MED ORDER — LACTATED RINGERS IV SOLN: INTRAVENOUS | Status: DC

## 2022-02-20 MED ORDER — SIGHTPATH DOSE#1 BSS IO SOLN
INTRAOCULAR | Status: DC | PRN
  Administered 2022-02-20: 53 mL via OPHTHALMIC

## 2022-02-20 MED ORDER — FENTANYL CITRATE (PF) 100 MCG/2ML IJ SOLN
INTRAMUSCULAR | Status: DC | PRN
  Administered 2022-02-20: 100 ug via INTRAVENOUS

## 2022-02-20 MED ORDER — MIDAZOLAM HCL 2 MG/2ML IJ SOLN
INTRAMUSCULAR | Status: DC | PRN
  Administered 2022-02-20: 2 mg via INTRAVENOUS

## 2022-02-20 MED ORDER — ARMC OPHTHALMIC DILATING DROPS
1.0000 | OPHTHALMIC | Status: DC | PRN
  Administered 2022-02-20 (×3): 1 via OPHTHALMIC

## 2022-02-20 MED ORDER — DEXMEDETOMIDINE HCL IN NACL 80 MCG/20ML IV SOLN
INTRAVENOUS | Status: DC | PRN
  Administered 2022-02-20: 4 ug via BUCCAL

## 2022-02-20 MED ORDER — SIGHTPATH DOSE#1 BSS IO SOLN
INTRAOCULAR | Status: DC | PRN
  Administered 2022-02-20: 1 mL

## 2022-02-20 MED ORDER — BRIMONIDINE TARTRATE-TIMOLOL 0.2-0.5 % OP SOLN
OPHTHALMIC | Status: DC | PRN
  Administered 2022-02-20: 1 [drp] via OPHTHALMIC

## 2022-02-20 MED ORDER — SIGHTPATH DOSE#1 NA HYALUR & NA CHOND-NA HYALUR IO KIT
PACK | INTRAOCULAR | Status: DC | PRN
  Administered 2022-02-20: 1 via OPHTHALMIC

## 2022-02-20 MED ORDER — ONDANSETRON HCL 4 MG/2ML IJ SOLN: 4.0000 mg | Freq: Once | INTRAMUSCULAR | Status: DC | PRN

## 2022-02-20 SURGICAL SUPPLY — 20 items
CANNULA ANT/CHMB 27G (MISCELLANEOUS) IMPLANT
CANNULA ANT/CHMB 27GA (MISCELLANEOUS) IMPLANT
CATARACT SUITE SIGHTPATH (MISCELLANEOUS) ×1 IMPLANT
FEE CATARACT SUITE SIGHTPATH (MISCELLANEOUS) ×1 IMPLANT
GLOVE SRG 8 PF TXTR STRL LF DI (GLOVE) ×1 IMPLANT
GLOVE SURG ENC TEXT LTX SZ7.5 (GLOVE) ×1 IMPLANT
GLOVE SURG GAMMEX PI TX LF 7.5 (GLOVE) IMPLANT
GLOVE SURG UNDER POLY LF SZ8 (GLOVE) ×1
LENS IOL TECNIS EYHANCE 18.0 (Intraocular Lens) IMPLANT
NDL FILTER BLUNT 18X1 1/2 (NEEDLE) ×1 IMPLANT
NDL RETROBULBAR .5 NSTRL (NEEDLE) IMPLANT
NEEDLE FILTER BLUNT 18X1 1/2 (NEEDLE) ×1 IMPLANT
PACK VIT ANT 23G (MISCELLANEOUS) IMPLANT
RING MALYGIN 7.0 (MISCELLANEOUS) IMPLANT
SUT ETHILON 10-0 CS-B-6CS-B-6 (SUTURE)
SUT VICRYL  9 0 (SUTURE)
SUT VICRYL 9 0 (SUTURE) IMPLANT
SUTURE EHLN 10-0 CS-B-6CS-B-6 (SUTURE) IMPLANT
SYR 3ML LL SCALE MARK (SYRINGE) ×1 IMPLANT
WATER STERILE IRR 250ML POUR (IV SOLUTION) ×1 IMPLANT

## 2022-02-20 NOTE — Op Note (Signed)
LOCATION:  [facility]   PREOPERATIVE DIAGNOSIS:    Nuclear sclerotic cataract right eye. H25.11   POSTOPERATIVE DIAGNOSIS:  Nuclear sclerotic cataract right eye.     PROCEDURE:  Phacoemusification with posterior chamber intraocular lens placement of the right eye   ULTRASOUND TIME: Procedure(s) with comments: CATARACT EXTRACTION PHACO AND INTRAOCULAR LENS PLACEMENT (IOC) RIGHT (Right) - 5.77 0:52.3  LENS:   Implant Name Type Inv. Item Serial No. Manufacturer Lot No. LRB No. Used Action  LENS IOL TECNIS EYHANCE 18.0 - [id number] Intraocular Lens LENS IOL TECNIS EYHANCE 18.0 [id number] [employer]  Right 1 Implanted         SURGEON:  Wyonia Hough, MD   ANESTHESIA:  Topical with tetracaine drops and 2% Xylocaine jelly, augmented with 1% preservative-free intracameral lidocaine.    COMPLICATIONS:  None.   DESCRIPTION OF PROCEDURE:  The patient was identified in the holding room and transported to the operating room and placed in the supine position under the operating microscope.  The right eye was identified as the operative eye and it was prepped and draped in the usual sterile ophthalmic fashion.   A 1 millimeter clear-corneal paracentesis was made at the 12:00 position.  0.5 ml of preservative-free 1% lidocaine was injected into the anterior chamber. The anterior chamber was filled with Viscoat viscoelastic.  A 2.4 millimeter keratome was used to make a near-clear corneal incision at the 9:00 position.  A curvilinear capsulorrhexis was made with a cystotome and capsulorrhexis forceps.  Balanced salt solution was used to hydrodissect and hydrodelineate the nucleus.   Phacoemulsification was then used in stop and chop fashion to remove the lens nucleus and epinucleus.  The remaining cortex was then removed using the irrigation and aspiration handpiece. Provisc was then placed into the capsular bag to distend it for lens placement.  A lens was then injected into the  capsular bag.  The remaining viscoelastic was aspirated.   Wounds were hydrated with balanced salt solution.  The anterior chamber was inflated to a physiologic pressure with balanced salt solution.  No wound leaks were noted. Cefuroxime 0.1 ml of a 15m/ml solution was injected into the anterior chamber for a dose of 1 mg of intracameral antibiotic at the completion of the case.   Timolol and Brimonidine drops were applied to the eye.  The patient was taken to the recovery room in stable condition without complications of anesthesia or surgery.   Hugh Garrow 02/20/2022, 1:05 PM

## 2022-02-20 NOTE — H&P (Signed)
[place]   Primary Care Physician:  Rusty Aus, MD Ophthalmologist: Dr. Leandrew Koyanagi  Pre-Procedure History & Physical: HPI:  Gary Mccoy. is a 71 y.o. male here for ophthalmic surgery.   Past Medical History:  Diagnosis Date   Arthritis    lower back   Back pain    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    CHF (congestive heart failure) (HCC)    Chronic kidney disease    Complication of anesthesia    Violent with hallucinations after procedure 10/2015   Coronary artery disease    DDD (degenerative disc disease), lumbar    Duodenal ulcer    Elevated lipids    GERD (gastroesophageal reflux disease)    Leg weakness, bilateral    s/p lumbar surgery   PAD (peripheral artery disease) ([place])    Polyradiculitis    Stroke ([place])    Rt lower leg  estimated 10 - 12 stokes in last 5 years   Wears dentures    full upper and lower    Past Surgical History:  Procedure Laterality Date   AORTA - BILATERAL FEMORAL ARTERY BYPASS GRAFT N/A 11/08/2015   Procedure: AORTA BIFEMORAL BYPASS GRAFT;  Surgeon: Katha Cabal, MD;  Location: [facility];  Service: Vascular;  Laterality: N/A;   BACK SURGERY  2005   CERVICAL [place]   COLONOSCOPY WITH PROPOFOL N/A 12/30/2017   Procedure: COLONOSCOPY WITH PROPOFOL;  Surgeon: Toledo, Benay Pike, MD;  Location: [facility];  Service: Gastroenterology;  Laterality: N/A;   COLONOSCOPY WITH PROPOFOL N/A 02/02/2021   Procedure: COLONOSCOPY WITH PROPOFOL;  Surgeon: Lesly Rubenstein, MD;  Location: [facility];  Service: Endoscopy;  Laterality: N/A;  [place]   ENDARTERECTOMY FEMORAL Bilateral 11/08/2015   Procedure: ENDARTERECTOMY FEMORAL;  Surgeon: Katha Cabal, MD;  Location: [facility];  Service: Vascular;  Laterality: Bilateral;  common, SFA, and profundis  Aortic endarterectomy    ESOPHAGOGASTRODUODENOSCOPY (EGD) WITH PROPOFOL N/A 09/04/2015   Procedure: ESOPHAGOGASTRODUODENOSCOPY (EGD) WITH PROPOFOL;  Surgeon: Lucilla Lame, MD;  Location: [place];  Service: Endoscopy;  Laterality: N/A;   ESOPHAGOGASTRODUODENOSCOPY (EGD) WITH PROPOFOL N/A 02/02/2021   Procedure: ESOPHAGOGASTRODUODENOSCOPY (EGD) WITH PROPOFOL;  Surgeon: Lesly Rubenstein, MD;  Location: [facility];  Service: Endoscopy;  Laterality: N/A;  [place]   LUMBAR LAMINECTOMY  2006   RIGHT/LEFT HEART CATH AND CORONARY ANGIOGRAPHY Bilateral 03/28/2021   Procedure: RIGHT/LEFT HEART CATH AND CORONARY ANGIOGRAPHY;  Surgeon: Yolonda Kida, MD;  Location: [facility];  Service: Cardiovascular;  Laterality: Bilateral;   SEPTOPLASTY N/A 09/28/2020   Procedure: SEPTOPLASTY;  Surgeon: Margaretha Sheffield, MD;  Location: [place];  Service: ENT;  Laterality: N/A;   TEE WITHOUT CARDIOVERSION N/A 09/22/2015   Procedure: TRANSESOPHAGEAL ECHOCARDIOGRAM (TEE);  Surgeon: Teodoro Spray, MD;  Location: [facility];  Service: Cardiovascular;  Laterality: N/A;   TURBINATE REDUCTION Bilateral 09/28/2020   Procedure: TURBINATE REDUCTION;  Surgeon: Margaretha Sheffield, MD;  Location: [facility];  Service: ENT;  Laterality: Bilateral;    Prior to Admission medications   Medication Sig Start Date End Date Taking? Authorizing Provider  albuterol (VENTOLIN HFA) 108 (90 Base) MCG/ACT inhaler Inhale 2 puffs into the lungs every 6 (six) hours as needed for wheezing or shortness of breath. 02/06/21  Yes Nicole Kindred A, DO  amLODipine (NORVASC) 5 MG tablet Take 5 mg by mouth 2 (two) times daily.   Yes [provider]  Cholecalciferol (VITAMIN D-1000 MAX ST) 25 MCG (  1000 UT) tablet Take 2,000 Units by mouth daily.   Yes [provider]  clopidogrel (PLAVIX) 75 MG tablet Take 75 mg by mouth daily. 04/16/21  Yes [provider]  Coenzyme Q10 (COQ10) 200 MG CAPS Take 200 mg by mouth daily.   Yes [provider]  hydrALAZINE (APRESOLINE) 100 MG tablet Take 100 mg by mouth 3 (three) times daily.   Yes [provider]  Iron-Vitamin C (VITRON-C) 65-125 MG TABS Take 1 tablet by mouth daily.   Yes [provider]  isosorbide mononitrate (IMDUR) 120 MG 24 hr tablet Take 120 mg by mouth daily. 11/16/21  Yes [provider]  metoprolol succinate (TOPROL-XL) 25 MG 24 hr tablet Take 1 tablet (25 mg total) by mouth daily. 02/07/21  Yes Nicole Kindred A, DO  Multiple Vitamins-Minerals (MULTIVITAMIN ADULT PO) Take 1 tablet by mouth daily. Men   Yes [provider]  Omega-3 Fatty Acids (FISH OIL PO) Take by mouth.   Yes [provider]  Rivaroxaban (XARELTO) 15 MG TABS tablet Take 1 tablet (15 mg total) by mouth daily. 02/07/21  Yes Nicole Kindred A, DO  rosuvastatin (CRESTOR) 40 MG tablet Take 20 mg by mouth daily.   Yes [provider]  vitamin B-12 (CYANOCOBALAMIN) 1000 MCG tablet Take 1,000 mcg by mouth daily.   Yes [provider]  furosemide (LASIX) 40 MG tablet Take 1 tablet (40 mg total) by mouth daily. for weight gain or swelling 10/30/21 01/28/22  Lorella Nimrod, MD  nitroGLYCERIN (NITROSTAT) 0.4 MG SL tablet Place under the tongue. 04/17/21 04/17/22  [provider]    Allergies as of 01/14/2022   (No Known Allergies)    Family History  Problem Relation Age of Onset   Heart attack Mother    Hypertension Mother    Varicose Veins Mother    Diabetes Father    Heart attack Father    Heart attack Maternal Grandmother    Stroke Maternal Grandmother     Social History   Socioeconomic History   Marital status: Married    Spouse name: Not on file   Number of children: Not on file   Years of education: Not on file   Highest education level: Not on file  Occupational History   Not on file  Tobacco Use   Smoking status: Every Day    Packs/day: 1.00    Years: 45.00    Total pack years: 45.00    Types: Cigarettes   Smokeless tobacco: Never   Tobacco comments:    05/17/21- currently 0.5 PPD  Vaping Use   Vaping Use: Never used  Substance  and Sexual Activity   Alcohol use: Yes    Comment: rare 3 a year   Drug use: No   Sexual activity: Not on file  Other Topics Concern   Not on file  Social History Narrative   Not on file   Social Determinants of Health   Financial Resource Strain: Not on file  Food Insecurity: No Food Insecurity (10/19/2021)   Hunger Vital Sign    Worried About Running Out of Food in the Last Year: Never true    Ran Out of Food in the Last Year: Never true  Transportation Needs: No Transportation Needs (10/19/2021)   PRAPARE - Hydrologist (Medical): No    Lack of Transportation (Non-Medical): No  Physical Activity: Not on file  Stress: Not on file  Social Connections: Not on file  Intimate Partner Violence: Not on file    Review of Systems: See HPI, otherwise negative ROS  Physical Exam: BP (!) 155/78   Pulse 61   Temp 97.8 F (36.6 C) (Temporal)   Resp 18   Ht _0  (1.854 m)   Wt 82.1 kg   SpO2 100%   BMI 23.87 kg/m  General:   Alert,  pleasant and cooperative in NAD Head:  Normocephalic and atraumatic. Lungs:  Clear to auscultation.    Heart:  Regular rate and rhythm.   Impression/Plan: Gary A Phil Dopp. is here for ophthalmic surgery.  Risks, benefits, limitations, and alternatives regarding ophthalmic surgery have been reviewed with the patient.  Questions have been answered.  All parties agreeable.   Leandrew Koyanagi, MD  02/20/2022, 12:19 PM

## 2022-02-20 NOTE — Transfer of Care (Signed)
Immediate Anesthesia Transfer of Care Note  Patient: Gary Mccoy.  Procedure(s) Performed: CATARACT EXTRACTION PHACO AND INTRAOCULAR LENS PLACEMENT (IOC) RIGHT (Right: Eye)  Patient Location: PACU  Anesthesia Type: MAC  Level of Consciousness: awake, alert  and patient cooperative  Airway and Oxygen Therapy: Patient Spontanous Breathing and Patient connected to supplemental oxygen  Post-op Assessment: Post-op Vital signs reviewed, Patient's Cardiovascular Status Stable, Respiratory Function Stable, Patent Airway and No signs of Nausea or vomiting  Post-op Vital Signs: Reviewed and stable  Complications: No notable events documented.

## 2022-02-20 NOTE — Anesthesia Postprocedure Evaluation (Signed)
Anesthesia Post Note  Patient: Gary Mccoy.  Procedure(s) Performed: CATARACT EXTRACTION PHACO AND INTRAOCULAR LENS PLACEMENT (IOC) RIGHT (Right: Eye)  Patient location during evaluation: PACU Anesthesia Type: MAC Level of consciousness: awake and alert, oriented and patient cooperative Pain management: pain level controlled Vital Signs Assessment: post-procedure vital signs reviewed and stable Respiratory status: spontaneous breathing, nonlabored ventilation and respiratory function stable Cardiovascular status: blood pressure returned to baseline and stable Postop Assessment: adequate PO intake Anesthetic complications: no   No notable events documented.   Last Vitals:  Vitals:   02/20/22 1307 02/20/22 1311  BP: 128/77 131/73  Pulse: 62 (!) 57  Resp: 14 16  Temp: (!) 36.4 C (!) 36.4 C  SpO2: 100% 99%    Last Pain:  Vitals:   02/20/22 1311  TempSrc:   PainSc: 0-No pain                 Darrin Nipper

## 2022-02-20 NOTE — Anesthesia Preprocedure Evaluation (Signed)
Anesthesia Evaluation  Patient identified by MRN, date of birth, ID band Patient awake    Reviewed: Allergy & Precautions, NPO status , Patient's Chart, lab work & pertinent test results  History of Anesthesia Complications (+) Emergence Delirium and history of anesthetic complications  Airway Mallampati: III   Neck ROM: Full    Dental  (+) Lower Dentures, Upper Dentures   Pulmonary Current Smoker (1/2 ppd)Patient did not abstain from smoking.   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)    GI/Hepatic PUD,GERD  ,,  Endo/Other  negative endocrine ROS    Renal/GU Renal disease (CKD)     Musculoskeletal  (+) Arthritis ,    Abdominal   Peds  Hematology negative hematology ROS (+)   Anesthesia Other Findings   Reproductive/Obstetrics                             Anesthesia Physical Anesthesia Plan  ASA: 3  Anesthesia Plan: MAC   Post-op Pain Management:    Induction: Intravenous  PONV Risk Score and Plan: 0 and Treatment may vary due to age or medical condition, Midazolam and TIVA  Airway Management Planned: Natural Airway and Nasal Cannula  Additional Equipment:   Intra-op Plan:   Post-operative Plan:   Informed Consent: I have reviewed the patients History and Physical, chart, labs and discussed the procedure including the risks, benefits and alternatives for the proposed anesthesia with the patient or authorized representative who has indicated his/her understanding and acceptance.   Patient has DNR.  Discussed DNR with patient and Suspend DNR.     Plan Discussed with: CRNA  Anesthesia Plan Comments:  (LMA/GETA backup discussed.  Patient consented for risks of anesthesia including but not limited to:  - adverse reactions to medications - damage to eyes, teeth, lips or other oral mucosa - nerve damage due to positioning  - sore throat or hoarseness - damage to heart, brain, nerves, lungs, other parts of body or loss of life  Informed patient about role of CRNA in peri- and intra-operative care.  Patient voiced understanding.)       Anesthesia Quick Evaluation

## 2022-02-21 ENCOUNTER — Other Ambulatory Visit: Payer: Self-pay

## 2022-02-21 ENCOUNTER — Encounter: Payer: Self-pay | Admitting: Ophthalmology

## 2022-02-26 DIAGNOSIS — H2512 Age-related nuclear cataract, left eye: Secondary | ICD-10-CM | POA: Diagnosis not present

## 2022-03-04 ENCOUNTER — Encounter: Payer: Self-pay | Admitting: Ophthalmology

## 2022-03-04 NOTE — Discharge Instructions (Signed)
   Cataract Surgery, Care After ? ?This sheet gives you information about how to care for yourself after your surgery.  Your ophthalmologist may also give you more specific instructions.  If you have problems or questions, contact your doctor at [facility], [phone]. ? ?What can I expect after the surgery? ?It is common to have: ?Itching ?Foreign body sensation (feels like a grain of sand in the eye) ?Watery discharge (excess tearing) ?Sensitivity to light and touch ?Bruising in or around the eye ?Mild blurred vision ? ?Follow these instructions at home: ?Do not touch or rub your eyes. ?You may be told to wear a protective shield or sunglasses to protect your eyes. ?Do not put a contact lens in the operative eye unless your doctor approves. ?Keep the lids and face clean and dry. ?Do not allow water to hit you directly in the face while showering. ?Keep soap and shampoo out of your eyes. ?Do not use eye makeup for 1 week. ? ?Check your eye every day for signs of infection.  Watch for: ?Redness, swelling, or pain. ?Fluid, blood or pus. ?Worsening vision. ?Worsening sensitivity to light or touch. ? ?Activity: ?During the first day, avoid bending over and reading.  You may resume reading and bending the next day. ?Do not drive or use heavy machinery for at least 24 hours. ?Avoid strenuous activities for 1 week.  Activities such as walking, treadmill, exercise bike, and climbing stairs are okay. ?Do not lift heavy (>20 pound) objects for 1 week. ?Do not do yardwork, gardening, or dirty housework (mopping, cleaning bathrooms, vacuuming, etc.) for 1 week. ?Do not swim or use a hot tub for 2 weeks. ?Ask your doctor when you can return to work. ? ?General Instructions: ?Take or apply prescription and over-the-counter medicines as directed by your doctor, including eyedrops and ointments. ?Resume medications discontinued prior to surgery, unless told otherwise by your doctor. ?Keep all follow up appointments as  scheduled. ? ?Contact a health care provider if: ?You have increased bruising around your eye. ?You have pain that is not helped with medication. ?You have a fever. ?You have fluid, pus, or blood coming from your eye or incision. ?Your sensitivity to light gets worse. ?You have spots (floaters) of flashing lights in your vision. ?You have nausea or vomiting. ? ?Go to the nearest emergency room or call 911 if: ?You have sudden loss of vision. ?You have severe, worsening eye pain. ? ?

## 2022-03-06 ENCOUNTER — Other Ambulatory Visit: Payer: Self-pay

## 2022-03-06 ENCOUNTER — Ambulatory Visit
Admission: RE | Admit: 2022-03-06 | Discharge: 2022-03-06 | Disposition: A | Discharge status: Home / Self Care | Payer: Medicare HMO | Attending: Ophthalmology | Admitting: Ophthalmology

## 2022-03-06 ENCOUNTER — Ambulatory Visit: Payer: Medicare HMO | Admitting: Anesthesiology

## 2022-03-06 ENCOUNTER — Encounter
Admission: RE | Disposition: A | Discharge status: Home / Self Care | Payer: Self-pay | Source: Home / Self Care | Attending: Ophthalmology

## 2022-03-06 ENCOUNTER — Encounter: Payer: Self-pay | Admitting: Ophthalmology

## 2022-03-06 DIAGNOSIS — H269 Unspecified cataract: Secondary | ICD-10-CM | POA: Diagnosis not present

## 2022-03-06 DIAGNOSIS — Z7901 Long term (current) use of anticoagulants: Secondary | ICD-10-CM | POA: Insufficient documentation

## 2022-03-06 DIAGNOSIS — N189 Chronic kidney disease, unspecified: Secondary | ICD-10-CM | POA: Diagnosis not present

## 2022-03-06 DIAGNOSIS — I739 Peripheral vascular disease, unspecified: Secondary | ICD-10-CM | POA: Diagnosis not present

## 2022-03-06 DIAGNOSIS — I509 Heart failure, unspecified: Secondary | ICD-10-CM | POA: Diagnosis not present

## 2022-03-06 DIAGNOSIS — N184 Chronic kidney disease, stage 4 (severe): Secondary | ICD-10-CM | POA: Diagnosis not present

## 2022-03-06 DIAGNOSIS — I2510 Atherosclerotic heart disease of native coronary artery without angina pectoris: Secondary | ICD-10-CM | POA: Diagnosis not present

## 2022-03-06 DIAGNOSIS — Z8673 Personal history of transient ischemic attack (TIA), and cerebral infarction without residual deficits: Secondary | ICD-10-CM | POA: Diagnosis not present

## 2022-03-06 DIAGNOSIS — I130 Hypertensive heart and chronic kidney disease with heart failure and stage 1 through stage 4 chronic kidney disease, or unspecified chronic kidney disease: Secondary | ICD-10-CM | POA: Diagnosis not present

## 2022-03-06 DIAGNOSIS — H2512 Age-related nuclear cataract, left eye: Secondary | ICD-10-CM | POA: Insufficient documentation

## 2022-03-06 HISTORY — PX: CATARACT EXTRACTION W/PHACO: SHX586

## 2022-03-06 SURGERY — PHACOEMULSIFICATION, CATARACT, WITH IOL INSERTION
Anesthesia: Monitor Anesthesia Care | Site: Eye | Laterality: Left

## 2022-03-06 MED ORDER — MIDAZOLAM HCL 2 MG/2ML IJ SOLN
INTRAMUSCULAR | Status: DC | PRN
  Administered 2022-03-06: 2 mg via INTRAVENOUS

## 2022-03-06 MED ORDER — SIGHTPATH DOSE#1 BSS IO SOLN
INTRAOCULAR | Status: DC | PRN
  Administered 2022-03-06: 1 mL

## 2022-03-06 MED ORDER — SIGHTPATH DOSE#1 BSS IO SOLN
INTRAOCULAR | Status: DC | PRN
  Administered 2022-03-06: 15 mL

## 2022-03-06 MED ORDER — SIGHTPATH DOSE#1 BSS IO SOLN
INTRAOCULAR | Status: DC | PRN
  Administered 2022-03-06: 62 mL via OPHTHALMIC

## 2022-03-06 MED ORDER — FENTANYL CITRATE (PF) 100 MCG/2ML IJ SOLN
INTRAMUSCULAR | Status: DC | PRN
  Administered 2022-03-06 (×2): 50 ug via INTRAVENOUS

## 2022-03-06 MED ORDER — SIGHTPATH DOSE#1 NA HYALUR & NA CHOND-NA HYALUR IO KIT
PACK | INTRAOCULAR | Status: DC | PRN
  Administered 2022-03-06: 1 via OPHTHALMIC

## 2022-03-06 MED ORDER — BRIMONIDINE TARTRATE-TIMOLOL 0.2-0.5 % OP SOLN
OPHTHALMIC | Status: DC | PRN
  Administered 2022-03-06: 1 [drp] via OPHTHALMIC

## 2022-03-06 MED ORDER — TETRACAINE HCL 0.5 % OP SOLN
1.0000 [drp] | OPHTHALMIC | Status: DC | PRN
  Administered 2022-03-06 (×2): 1 [drp] via OPHTHALMIC

## 2022-03-06 MED ORDER — ARMC OPHTHALMIC DILATING DROPS
1.0000 | OPHTHALMIC | Status: DC | PRN
  Administered 2022-03-06 (×3): 1 via OPHTHALMIC

## 2022-03-06 MED ORDER — CEFUROXIME OPHTHALMIC INJECTION 1 MG/0.1 ML
INJECTION | OPHTHALMIC | Status: DC | PRN
  Administered 2022-03-06: .1 mL via INTRACAMERAL

## 2022-03-06 SURGICAL SUPPLY — 20 items
CANNULA ANT/CHMB 27G (MISCELLANEOUS) IMPLANT
CANNULA ANT/CHMB 27GA (MISCELLANEOUS) IMPLANT
CATARACT SUITE SIGHTPATH (MISCELLANEOUS) ×1 IMPLANT
FEE CATARACT SUITE SIGHTPATH (MISCELLANEOUS) ×1 IMPLANT
GLOVE SRG 8 PF TXTR STRL LF DI (GLOVE) ×1 IMPLANT
GLOVE SURG ENC TEXT LTX SZ7.5 (GLOVE) ×1 IMPLANT
GLOVE SURG GAMMEX PI TX LF 7.5 (GLOVE) IMPLANT
GLOVE SURG UNDER POLY LF SZ8 (GLOVE) ×1
LENS IOL TECNIS EYHANCE 18.5 (Intraocular Lens) IMPLANT
NDL FILTER BLUNT 18X1 1/2 (NEEDLE) ×1 IMPLANT
NDL RETROBULBAR .5 NSTRL (NEEDLE) IMPLANT
NEEDLE FILTER BLUNT 18X1 1/2 (NEEDLE) ×1 IMPLANT
PACK VIT ANT 23G (MISCELLANEOUS) IMPLANT
RING MALYGIN 7.0 (MISCELLANEOUS) IMPLANT
SUT ETHILON 10-0 CS-B-6CS-B-6 (SUTURE)
SUT VICRYL  9 0 (SUTURE)
SUT VICRYL 9 0 (SUTURE) IMPLANT
SUTURE EHLN 10-0 CS-B-6CS-B-6 (SUTURE) IMPLANT
SYR 3ML LL SCALE MARK (SYRINGE) ×1 IMPLANT
WATER STERILE IRR 250ML POUR (IV SOLUTION) ×1 IMPLANT

## 2022-03-06 NOTE — Anesthesia Preprocedure Evaluation (Addendum)
Anesthesia Evaluation  Patient identified by MRN, date of birth, ID band Patient awake    Reviewed: Allergy & Precautions, NPO status , Patient's Chart, lab work & pertinent test results  History of Anesthesia Complications (+) Emergence Delirium and history of anesthetic complications  Airway Mallampati: III   Neck ROM: Full    Dental  (+) Lower Dentures, Upper Dentures   Pulmonary Current SmokerPatient did not abstain from smoking.   Pulmonary exam normal breath sounds clear to auscultation       Cardiovascular hypertension, + CAD, + Peripheral Vascular Disease (on Xarelto) and +CHF  Normal cardiovascular exam Rhythm:Regular Rate:Normal  ECHO 10/2021:  1. Left ventricular ejection fraction, by estimation, is 55 to 60%. The  left ventricle has normal function. The left ventricle has no regional  wall motion abnormalities. Left ventricular diastolic parameters were  normal.   2. Right ventricular systolic function is normal. The right ventricular  size is normal.   3. The mitral valve is normal in structure. Mild mitral valve  regurgitation. No evidence of mitral stenosis.   4. The aortic valve is normal in structure. Aortic valve regurgitation is  not visualized. No aortic stenosis is present.   5. The inferior vena cava is normal in size with greater than 50%  respiratory variability, suggesting right atrial pressure of 3 mmHg.      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; residual left hand numbness and right leg numbness and weakness)    GI/Hepatic Past duodenal ulcer   Endo/Other  negative endocrine ROS    Renal/GU Renal InsufficiencyRenal disease (CKD)     Musculoskeletal  (+) Arthritis ,    Abdominal   Peds  Hematology negative hematology ROS (+)   Anesthesia Other Findings   Reproductive/Obstetrics                              Anesthesia Physical Anesthesia Plan  ASA:  3  Anesthesia Plan: MAC   Post-op Pain Management:    Induction: Intravenous  PONV Risk Score and Plan: 0 and Treatment may vary due to age or medical condition, Midazolam and TIVA  Airway Management Planned: Natural Airway and Nasal Cannula  Additional Equipment:   Intra-op Plan:   Post-operative Plan:   Informed Consent: I have reviewed the patients History and Physical, chart, labs and discussed the procedure including the risks, benefits and alternatives for the proposed anesthesia with the patient or authorized representative who has indicated his/her understanding and acceptance.   Patient has DNR.  Discussed DNR with patient and Suspend DNR.     Plan Discussed with: CRNA  Anesthesia Plan Comments:         Anesthesia Quick Evaluation

## 2022-03-06 NOTE — H&P (Signed)
[place]   Primary Care Physician:  Rusty Aus, MD Ophthalmologist: Dr. Leandrew Koyanagi  Pre-Procedure History & Physical: HPI:  Gary Mccoy. is a 71 y.o. male here for ophthalmic surgery.   Past Medical History:  Diagnosis Date   Arthritis    lower back   Back pain    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    CHF (congestive heart failure) (HCC)    Chronic kidney disease    Complication of anesthesia    Violent with hallucinations after procedure 10/2015   Coronary artery disease    DDD (degenerative disc disease), lumbar    Duodenal ulcer    Elevated lipids    GERD (gastroesophageal reflux disease)    Leg weakness, bilateral    s/p lumbar surgery   PAD (peripheral artery disease) ([place])    Polyradiculitis    Stroke ([place])    Rt lower leg  estimated 10 - 12 stokes in last 5 years   Wears dentures    full upper and lower    Past Surgical History:  Procedure Laterality Date   AORTA - BILATERAL FEMORAL ARTERY BYPASS GRAFT N/A 11/08/2015   Procedure: AORTA BIFEMORAL BYPASS GRAFT;  Surgeon: Katha Cabal, MD;  Location: [facility];  Service: Vascular;  Laterality: N/A;   BACK SURGERY  2005   CATARACT EXTRACTION W/PHACO Right 02/20/2022   Procedure: CATARACT EXTRACTION PHACO AND INTRAOCULAR LENS PLACEMENT ([place]) RIGHT;  Surgeon: Leandrew Koyanagi, MD;  Location: [place];  Service: Ophthalmology;  Laterality: Right;  5.77 0:52.3   CERVICAL DISC SURGERY  1987   COLONOSCOPY WITH PROPOFOL N/A 12/30/2017   Procedure: COLONOSCOPY WITH PROPOFOL;  Surgeon: Toledo, Benay Pike, MD;  Location: [facility];  Service: Gastroenterology;  Laterality: N/A;   COLONOSCOPY WITH PROPOFOL N/A 02/02/2021   Procedure: COLONOSCOPY WITH PROPOFOL;  Surgeon: Lesly Rubenstein, MD;  Location: [facility];  Service: Endoscopy;  Laterality: N/A;  [place]   ENDARTERECTOMY FEMORAL Bilateral 11/08/2015   Procedure: ENDARTERECTOMY FEMORAL;  Surgeon: Katha Cabal, MD;   Location: [facility];  Service: Vascular;  Laterality: Bilateral;  common, SFA, and profundis  Aortic endarterectomy    ESOPHAGOGASTRODUODENOSCOPY (EGD) WITH PROPOFOL N/A 09/04/2015   Procedure: ESOPHAGOGASTRODUODENOSCOPY (EGD) WITH PROPOFOL;  Surgeon: Lucilla Lame, MD;  Location: [place];  Service: Endoscopy;  Laterality: N/A;   ESOPHAGOGASTRODUODENOSCOPY (EGD) WITH PROPOFOL N/A 02/02/2021   Procedure: ESOPHAGOGASTRODUODENOSCOPY (EGD) WITH PROPOFOL;  Surgeon: Lesly Rubenstein, MD;  Location: [facility];  Service: Endoscopy;  Laterality: N/A;  Melena   LUMBAR LAMINECTOMY  2006   RIGHT/LEFT HEART CATH AND CORONARY ANGIOGRAPHY Bilateral 03/28/2021   Procedure: RIGHT/LEFT HEART CATH AND CORONARY ANGIOGRAPHY;  Surgeon: Yolonda Kida, MD;  Location: [facility] [facility];  Service: Cardiovascular;  Laterality: Bilateral;   SEPTOPLASTY N/A 09/28/2020   Procedure: SEPTOPLASTY;  Surgeon: Margaretha Sheffield, MD;  Location: [place];  Service: ENT;  Laterality: N/A;   TEE WITHOUT CARDIOVERSION N/A 09/22/2015   Procedure: TRANSESOPHAGEAL ECHOCARDIOGRAM (TEE);  Surgeon: Teodoro Spray, MD;  Location: [facility];  Service: Cardiovascular;  Laterality: N/A;   TURBINATE REDUCTION Bilateral 09/28/2020   Procedure: TURBINATE REDUCTION;  Surgeon: Margaretha Sheffield, MD;  Location: [place];  Service: ENT;  Laterality: Bilateral;    Prior to Admission medications   Medication Sig Start Date End Date Taking? Authorizing Provider  albuterol (VENTOLIN HFA) 108 (90 Base) MCG/ACT inhaler Inhale 2 puffs into the lungs every 6 (six) hours as needed for wheezing or shortness  of breath. 02/06/21  Yes Nicole Kindred A, DO  amLODipine (NORVASC) 5 MG tablet Take 5 mg by mouth 2 (two) times daily.   Yes [provider]  Cholecalciferol (VITAMIN D-1000 MAX ST) 25 MCG (1000 UT) tablet Take 2,000 Units by mouth daily.   Yes [provider]  clopidogrel (PLAVIX) 75 MG tablet Take 75 mg  by mouth daily. 04/16/21  Yes [provider]  Coenzyme Q10 (COQ10) 200 MG CAPS Take 200 mg by mouth daily.   Yes [provider]  furosemide (LASIX) 40 MG tablet Take 1 tablet (40 mg total) by mouth daily. for weight gain or swelling 10/30/21 03/06/22 Yes Lorella Nimrod, MD  hydrALAZINE (APRESOLINE) 100 MG tablet Take 100 mg by mouth 3 (three) times daily.   Yes [provider]  metoprolol succinate (TOPROL-XL) 25 MG 24 hr tablet Take 1 tablet (25 mg total) by mouth daily. 02/07/21  Yes Nicole Kindred A, DO  Multiple Vitamins-Minerals (MULTIVITAMIN ADULT PO) Take 1 tablet by mouth daily. Men   Yes [provider]  Omega-3 Fatty Acids (FISH OIL PO) Take by mouth.   Yes [provider]  Rivaroxaban (XARELTO) 15 MG TABS tablet Take 1 tablet (15 mg total) by mouth daily. 02/07/21  Yes Nicole Kindred A, DO  rosuvastatin (CRESTOR) 40 MG tablet Take 20 mg by mouth daily.   Yes [provider]  vitamin B-12 (CYANOCOBALAMIN) 1000 MCG tablet Take 1,000 mcg by mouth daily.   Yes [provider]  Iron-Vitamin C (VITRON-C) 65-125 MG TABS Take 1 tablet by mouth daily.    [provider]  isosorbide mononitrate (IMDUR) 120 MG 24 hr tablet Take 120 mg by mouth daily. 11/16/21   [provider]  nitroGLYCERIN (NITROSTAT) 0.4 MG SL tablet Place under the tongue. 04/17/21 04/17/22  [provider]    Allergies as of 01/14/2022   (No Known Allergies)    Family History  Problem Relation Age of Onset   Heart attack Mother    Hypertension Mother    Varicose Veins Mother    Diabetes Father    Heart attack Father    Heart attack Maternal Grandmother    Stroke Maternal Grandmother     Social History   Socioeconomic History   Marital status: Married    Spouse name: Not on file   Number of children: Not on file   Years of education: Not on file   Highest education level: Not on file  Occupational History   Not on file   Tobacco Use   Smoking status: Every Day    Packs/day: 1.00    Years: 45.00    Total pack years: 45.00    Types: Cigarettes   Smokeless tobacco: Never   Tobacco comments:    05/17/21- currently 0.5 PPD  Vaping Use   Vaping Use: Never used  Substance and Sexual Activity   Alcohol use: Yes    Comment: rare 3 a year   Drug use: No   Sexual activity: Not on file  Other Topics Concern   Not on file  Social History Narrative   Not on file   Social Determinants of Health   Financial Resource Strain: Not on file  Food Insecurity: No Food Insecurity (10/19/2021)   Hunger Vital Sign    Worried About Running Out of Food in the Last Year: Never true    Ran Out of Food in the Last Year: Never true  Transportation Needs: No Transportation Needs (10/19/2021)  PRAPARE - Hydrologist (Medical): No    Lack of Transportation (Non-Medical): No  Physical Activity: Not on file  Stress: Not on file  Social Connections: Not on file  Intimate Partner Violence: Not on file    Review of Systems: See HPI, otherwise negative ROS  Physical Exam: Ht _0  (1.854 m)   Wt 82.1 kg   BMI 23.88 kg/m  General:   Alert,  pleasant and cooperative in NAD Head:  Normocephalic and atraumatic. Lungs:  Clear to auscultation.    Heart:  Regular rate and rhythm.   Impression/Plan: Jaystin A Phil Dopp. is here for ophthalmic surgery.  Risks, benefits, limitations, and alternatives regarding ophthalmic surgery have been reviewed with the patient.  Questions have been answered.  All parties agreeable.   Leandrew Koyanagi, MD  03/06/2022, 12:08 PM

## 2022-03-06 NOTE — Anesthesia Postprocedure Evaluation (Signed)
Anesthesia Post Note  Patient: Gary Mccoy.  Procedure(s) Performed: CATARACT EXTRACTION PHACO AND INTRAOCULAR LENS PLACEMENT (IOC) LEFT (Left: Eye)  Patient location during evaluation: PACU Anesthesia Type: MAC Level of consciousness: awake and alert Pain management: pain level controlled Vital Signs Assessment: post-procedure vital signs reviewed and stable Respiratory status: spontaneous breathing, nonlabored ventilation and respiratory function stable Cardiovascular status: blood pressure returned to baseline and stable Postop Assessment: no apparent nausea or vomiting Anesthetic complications: no   No notable events documented.   Last Vitals:  Vitals:   03/06/22 1321 03/06/22 1326  BP: (!) 150/69   Pulse: 63   Resp: 13   Temp: 36.6 C 36.6 C  SpO2: 98%     Last Pain:  Vitals:   03/06/22 1326  TempSrc:   PainSc: 0-No pain                 [place] Ouch

## 2022-03-06 NOTE — Op Note (Signed)
OPERATIVE NOTE  Kaston Faughn [id number] 03/06/2022   PREOPERATIVE DIAGNOSIS:  Nuclear sclerotic cataract left eye. H25.12   POSTOPERATIVE DIAGNOSIS:    Nuclear sclerotic cataract left eye.     PROCEDURE:  Phacoemusification with posterior chamber intraocular lens placement of the left eye  Ultrasound time: Procedure(s) with comments: CATARACT EXTRACTION PHACO AND INTRAOCULAR LENS PLACEMENT (IOC) LEFT (Left) - 5.77 1:07.5  LENS:   Implant Name Type Inv. Item Serial No. Manufacturer Lot No. LRB No. Used Action  LENS IOL TECNIS EYHANCE 18.5 - [id number] Intraocular Lens LENS IOL TECNIS EYHANCE 18.5 [id number] [employer]  Left 1 Implanted      SURGEON:  Wyonia Hough, MD   ANESTHESIA:  Topical with tetracaine drops and 2% Xylocaine jelly, augmented with 1% preservative-free intracameral lidocaine.    COMPLICATIONS:  None.   DESCRIPTION OF PROCEDURE:  The patient was identified in the holding room and transported to the operating room and placed in the supine position under the operating microscope.  The left eye was identified as the operative eye and it was prepped and draped in the usual sterile ophthalmic fashion.   A 1 millimeter clear-corneal paracentesis was made at the 1:30 position.  0.5 ml of preservative-free 1% lidocaine was injected into the anterior chamber.  The anterior chamber was filled with Viscoat viscoelastic.  A 2.4 millimeter keratome was used to make a near-clear corneal incision at the 10:30 position.  .  A curvilinear capsulorrhexis was made with a cystotome and capsulorrhexis forceps.  Balanced salt solution was used to hydrodissect and hydrodelineate the nucleus.   Phacoemulsification was then used in stop and chop fashion to remove the lens nucleus and epinucleus.  The remaining cortex was then removed using the irrigation and aspiration handpiece. Provisc was then placed into the capsular bag to distend it for lens placement.  A lens was then  injected into the capsular bag.  The remaining viscoelastic was aspirated.   Wounds were hydrated with balanced salt solution.  The anterior chamber was inflated to a physiologic pressure with balanced salt solution.  No wound leaks were noted. Cefuroxime 0.1 ml of a 42m/ml solution was injected into the anterior chamber for a dose of 1 mg of intracameral antibiotic at the completion of the case.   Timolol and Brimonidine drops were applied to the eye.  The patient was taken to the recovery room in stable condition without complications of anesthesia or surgery.  Cashmere Harmes 03/06/2022, 1:18 PM

## 2022-03-06 NOTE — Transfer of Care (Signed)
Immediate Anesthesia Transfer of Care Note  Patient: Gary Mccoy.  Procedure(s) Performed: CATARACT EXTRACTION PHACO AND INTRAOCULAR LENS PLACEMENT (IOC) LEFT (Left: Eye)  Patient Location: PACU  Anesthesia Type: MAC  Level of Consciousness: awake, alert  and patient cooperative  Airway and Oxygen Therapy: Patient Spontanous Breathing and Patient connected to supplemental oxygen  Post-op Assessment: Post-op Vital signs reviewed, Patient's Cardiovascular Status Stable, Respiratory Function Stable, Patent Airway and No signs of Nausea or vomiting  Post-op Vital Signs: Reviewed and stable  Complications: No notable events documented.

## 2022-03-07 ENCOUNTER — Encounter: Payer: Self-pay | Admitting: Ophthalmology

## 2022-03-29 DIAGNOSIS — Z0100 Encounter for examination of eyes and vision without abnormal findings: Secondary | ICD-10-CM | POA: Diagnosis not present

## 2022-04-09 DIAGNOSIS — I739 Peripheral vascular disease, unspecified: Secondary | ICD-10-CM | POA: Diagnosis not present

## 2022-04-09 DIAGNOSIS — I25118 Atherosclerotic heart disease of native coronary artery with other forms of angina pectoris: Secondary | ICD-10-CM | POA: Diagnosis not present

## 2022-04-09 DIAGNOSIS — R0602 Shortness of breath: Secondary | ICD-10-CM | POA: Diagnosis not present

## 2022-04-09 DIAGNOSIS — Z95820 Peripheral vascular angioplasty status with implants and grafts: Secondary | ICD-10-CM | POA: Diagnosis not present

## 2022-04-09 DIAGNOSIS — I10 Essential (primary) hypertension: Secondary | ICD-10-CM | POA: Diagnosis not present

## 2022-04-09 DIAGNOSIS — I420 Dilated cardiomyopathy: Secondary | ICD-10-CM | POA: Diagnosis not present

## 2022-04-09 DIAGNOSIS — Z95828 Presence of other vascular implants and grafts: Secondary | ICD-10-CM | POA: Diagnosis not present

## 2022-04-09 DIAGNOSIS — I5020 Unspecified systolic (congestive) heart failure: Secondary | ICD-10-CM | POA: Diagnosis not present

## 2022-04-09 DIAGNOSIS — Z8673 Personal history of transient ischemic attack (TIA), and cerebral infarction without residual deficits: Secondary | ICD-10-CM | POA: Diagnosis not present

## 2022-04-19 ENCOUNTER — Other Ambulatory Visit (INDEPENDENT_AMBULATORY_CARE_PROVIDER_SITE_OTHER): Payer: Self-pay | Admitting: Vascular Surgery

## 2022-04-19 DIAGNOSIS — I6523 Occlusion and stenosis of bilateral carotid arteries: Secondary | ICD-10-CM

## 2022-04-19 DIAGNOSIS — Z9889 Other specified postprocedural states: Secondary | ICD-10-CM

## 2022-04-20 NOTE — Progress Notes (Unsigned)
MRN : [id number]  Gary Mccoy. is a 72 y.o. ([birth year]) male who presents with chief complaint of check circulation.  History of Present Illness:  The patient returns to the office for followup and review of the noninvasive studies. There have been no interval changes in lower extremity symptoms. No interval shortening of the patient's claudication distance or development of rest pain symptoms. No new ulcers or wounds have occurred since the last visit.   The patient denies amaurosis fugax or recent TIA symptoms. There are no further neurological changes noted since since last visit.   There has been a significant change to the patient's overall health care.  He is s/p PCI to right coronary on 04/16/2021.  No further coronary interventions this past year   The patient denies history of DVT, PE or superficial thrombophlebitis. The patient denies recent episodes of angina or shortness of breath.    ABI Rt=0.63 and Lt=0.91  (previous ABI's Rt=0.74 and Lt=1.05 ) Previous duplex ultrasound of the aorta iliac arteries shows the ABF bypass is patent no hemodynamically significant stenosis identified, No change since last study   Carotid duplex shows RICA 10-25% and LICA 85-27% previous occlusion of the left common carotid is identified         No outpatient medications have been marked as taking for the 04/22/22 encounter (Appointment) with Delana Meyer, Dolores Lory, MD.    Past Medical History:  Diagnosis Date   Arthritis    lower back   Back pain    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    CHF (congestive heart failure) ([place])    Chronic kidney disease    Complication of anesthesia    Violent with hallucinations after procedure 10/2015   Coronary artery disease    DDD (degenerative disc disease), lumbar    Duodenal ulcer    Elevated lipids    GERD (gastroesophageal reflux disease)    Leg weakness, bilateral    s/p lumbar surgery   PAD (peripheral artery disease) ([place])     Polyradiculitis    Stroke ([place])    Rt lower leg  estimated 10 - 12 stokes in last 5 years   Wears dentures    full upper and lower    Past Surgical History:  Procedure Laterality Date   AORTA - BILATERAL FEMORAL ARTERY BYPASS GRAFT N/A 11/08/2015   Procedure: AORTA BIFEMORAL BYPASS GRAFT;  Surgeon: Katha Cabal, MD;  Location: [facility];  Service: Vascular;  Laterality: N/A;   BACK SURGERY  2005   CATARACT EXTRACTION W/PHACO Right 02/20/2022   Procedure: CATARACT EXTRACTION PHACO AND INTRAOCULAR LENS PLACEMENT ([place]) RIGHT;  Surgeon: Leandrew Koyanagi, MD;  Location: [place];  Service: Ophthalmology;  Laterality: Right;  5.77 0:52.3   CATARACT EXTRACTION W/PHACO Left 03/06/2022   Procedure: CATARACT EXTRACTION PHACO AND INTRAOCULAR LENS PLACEMENT ([place]) LEFT;  Surgeon: Leandrew Koyanagi, MD;  Location: [place];  Service: Ophthalmology;  Laterality: Left;  5.77 1:07.5   CERVICAL DISC SURGERY  1987   COLONOSCOPY WITH PROPOFOL N/A 12/30/2017   Procedure: COLONOSCOPY WITH PROPOFOL;  Surgeon: [place], Benay Pike, MD;  Location: [facility];  Service: Gastroenterology;  Laterality: N/A;   COLONOSCOPY WITH PROPOFOL N/A 02/02/2021   Procedure: COLONOSCOPY WITH PROPOFOL;  Surgeon: Lesly Rubenstein, MD;  Location: [facility];  Service: Endoscopy;  Laterality: N/A;  [place]   ENDARTERECTOMY FEMORAL Bilateral 11/08/2015   Procedure: ENDARTERECTOMY FEMORAL;  Surgeon:  Katha Cabal, MD;  Location: [facility];  Service: Vascular;  Laterality: Bilateral;  common, SFA, and profundis  Aortic endarterectomy    ESOPHAGOGASTRODUODENOSCOPY (EGD) WITH PROPOFOL N/A 09/04/2015   Procedure: ESOPHAGOGASTRODUODENOSCOPY (EGD) WITH PROPOFOL;  Surgeon: Lucilla Lame, MD;  Location: [place];  Service: Endoscopy;  Laterality: N/A;   ESOPHAGOGASTRODUODENOSCOPY (EGD) WITH PROPOFOL N/A 02/02/2021   Procedure: ESOPHAGOGASTRODUODENOSCOPY (EGD) WITH PROPOFOL;  Surgeon:  Lesly Rubenstein, MD;  Location: [facility];  Service: Endoscopy;  Laterality: N/A;  [place]   LUMBAR LAMINECTOMY  2006   RIGHT/LEFT HEART CATH AND CORONARY ANGIOGRAPHY Bilateral 03/28/2021   Procedure: RIGHT/LEFT HEART CATH AND CORONARY ANGIOGRAPHY;  Surgeon: Yolonda Kida, MD;  Location: [place] [facility];  Service: Cardiovascular;  Laterality: Bilateral;   SEPTOPLASTY N/A 09/28/2020   Procedure: SEPTOPLASTY;  Surgeon: Margaretha Sheffield, MD;  Location: [place];  Service: ENT;  Laterality: N/A;   TEE WITHOUT CARDIOVERSION N/A 09/22/2015   Procedure: TRANSESOPHAGEAL ECHOCARDIOGRAM (TEE);  Surgeon: Teodoro Spray, MD;  Location: [facility];  Service: Cardiovascular;  Laterality: N/A;   TURBINATE REDUCTION Bilateral 09/28/2020   Procedure: TURBINATE REDUCTION;  Surgeon: Margaretha Sheffield, MD;  Location: [place];  Service: ENT;  Laterality: Bilateral;    Social History Social History   Tobacco Use   Smoking status: Every Day    Packs/day: 1.00    Years: 45.00    Total pack years: 45.00    Types: Cigarettes   Smokeless tobacco: Never   Tobacco comments:    05/17/21- currently 0.5 PPD  Vaping Use   Vaping Use: Never used  Substance Use Topics   Alcohol use: Yes    Comment: rare 3 a year   Drug use: No    Family History Family History  Problem Relation Age of Onset   Heart attack Mother    Hypertension Mother    Varicose Veins Mother    Diabetes Father    Heart attack Father    Heart attack Maternal Grandmother    Stroke Maternal Grandmother     No Known Allergies   REVIEW OF SYSTEMS (Negative unless checked)  Constitutional: '[]'$ Weight loss  '[]'$ Fever  '[]'$ Chills Cardiac: '[]'$ Chest pain   '[]'$ Chest pressure   '[]'$ Palpitations   '[]'$ Shortness of breath when laying flat   '[]'$ Shortness of breath with exertion. Vascular:  '[x]'$ Pain in legs with walking   '[]'$ Pain in legs at rest  '[]'$ History of DVT   '[]'$ Phlebitis   '[]'$ Swelling in legs   '[]'$ Varicose veins   '[]'$ Non-healing  ulcers Pulmonary:   '[]'$ Uses home oxygen   '[]'$ Productive cough   '[]'$ Hemoptysis   '[]'$ Wheeze  '[]'$ COPD   '[]'$ Asthma Neurologic:  '[]'$ Dizziness   '[]'$ Seizures   '[]'$ History of stroke   '[]'$ History of TIA  '[]'$ Aphasia   '[]'$ Vissual changes   '[]'$ Weakness or numbness in arm   '[]'$ Weakness or numbness in leg Musculoskeletal:   '[]'$ Joint swelling   '[]'$ Joint pain   '[]'$ Low back pain Hematologic:  '[]'$ Easy bruising  '[]'$ Easy bleeding   '[]'$ Hypercoagulable state   '[]'$ Anemic Gastrointestinal:  '[]'$ Diarrhea   '[]'$ Vomiting  '[]'$ Gastroesophageal reflux/heartburn   '[]'$ Difficulty swallowing. Genitourinary:  '[]'$ Chronic kidney disease   '[]'$ Difficult urination  '[]'$ Frequent urination   '[]'$ Blood in urine Skin:  '[]'$ Rashes   '[]'$ Ulcers  Psychological:  '[]'$ History of anxiety   '[]'$  History of major depression.  Physical Examination  There were no vitals filed for this visit. There is no height or weight on file to calculate BMI. Gen: WD/WN, NAD Head: [place]/AT, No temporalis wasting.  Ear/Nose/Throat: Hearing grossly intact, nares w/o erythema or drainage Eyes: PER, EOMI, sclera nonicteric.  Neck: Supple, no masses.  No bruit or JVD.  Pulmonary:  Good air movement, no audible wheezing, no use of accessory muscles.  Cardiac: RRR, normal S1, S2, no Murmurs. Vascular: Bilateral carotid bruits mild trophic changes, no open wounds Vessel Right Left  Radial Palpable Palpable  PT Not Palpable Not Palpable  DP Not Palpable Not Palpable  Gastrointestinal: soft, non-distended. No guarding/no peritoneal signs.  Musculoskeletal: M/S 5/5 throughout.  No visible deformity.  Neurologic: CN 2-12 intact. Pain and light touch intact in extremities.  Symmetrical.  Speech is fluent. Motor exam as listed above. Psychiatric: Judgment intact, Mood & affect appropriate for pt's clinical situation. Dermatologic: No rashes or ulcers noted.  No changes consistent with cellulitis.   CBC Lab Results  Component Value Date   WBC 9.4 10/30/2021   HGB 11.1 (L) 10/30/2021   HCT 32.6 (L)  10/30/2021   MCV 84.9 10/30/2021   PLT 149 (L) 10/30/2021    BMET    Component Value Date/Time   NA 137 10/30/2021 0454   K 3.8 10/30/2021 0454   CL 107 10/30/2021 0454   CO2 20 (L) 10/30/2021 0454   GLUCOSE 92 10/30/2021 0454   BUN 50 (H) 10/30/2021 0454   CREATININE 2.98 (H) 10/30/2021 0454   CALCIUM 8.5 (L) 10/30/2021 0454   GFRNONAA 22 (L) 10/30/2021 0454   GFRAA 48 (L) 02/26/2018 1359   CrCl cannot be calculated (Patient's most recent lab result is older than the maximum 21 days allowed.).  COAG Lab Results  Component Value Date   INR 1.9 (H) 01/29/2021   INR 2.24 02/26/2018   INR 1.21 11/08/2015    Radiology No results found.   Assessment/Plan 1. Atherosclerosis of native artery of both lower extremities with intermittent claudication (HCC) Recommend:   The patient has evidence of atherosclerosis of the lower extremities with claudication.  The patient does not voice lifestyle limiting changes at this point in time.   Noninvasive studies do not suggest clinically significant change.   No invasive studies, angiography or surgery at this time The patient should continue walking and begin a more formal exercise program.  The patient should continue antiplatelet therapy and aggressive treatment of the lipid abnormalities   No changes in the patient's medications at this time - VAS [place] ABI WITH/WO TBI; Future  2. Bilateral carotid artery stenosis Given the patient's asymptomatic subcritical stenosis no further invasive testing or surgery at this time.   Duplex ultrasound shows 40-59% stenosis RICA and the LICA, no significant change compared to last year.   Continue antiplatelet therapy as prescribed Continue management of CAD, HTN and Hyperlipidemia Healthy heart diet,  encouraged exercise at least 4 times per week Follow up in 12 months with duplex ultrasound and physical exam  - VAS US CAROTID; Future  3. Benign essential HTN Continue antihypertensive  medications as already ordered, these medications have been reviewed and there are no changes at this time.  4. Acute on chronic congestive heart failure, unspecified heart failure type ([place]) Continue cardiac and antihypertensive medications as already ordered and reviewed, no changes at this time.  Continue statin as ordered and reviewed, no changes at this time  Diuretics as needed for volume overload  5. Hyperlipidemia, unspecified hyperlipidemia type Continue statin as ordered and reviewed, no changes at this time    Hortencia Pilar, MD  04/20/2022 1:02 PM

## 2022-04-22 ENCOUNTER — Ambulatory Visit (INDEPENDENT_AMBULATORY_CARE_PROVIDER_SITE_OTHER): Payer: Medicare HMO

## 2022-04-22 ENCOUNTER — Ambulatory Visit (INDEPENDENT_AMBULATORY_CARE_PROVIDER_SITE_OTHER): Payer: Medicare HMO | Admitting: Vascular Surgery

## 2022-04-22 ENCOUNTER — Encounter (INDEPENDENT_AMBULATORY_CARE_PROVIDER_SITE_OTHER): Payer: Self-pay | Admitting: Vascular Surgery

## 2022-04-22 ENCOUNTER — Encounter (INDEPENDENT_AMBULATORY_CARE_PROVIDER_SITE_OTHER): Payer: Medicare HMO

## 2022-04-22 VITALS — BP 185/80 | HR 67 | Ht 73.0 in | Wt 183.0 lb

## 2022-04-22 DIAGNOSIS — I10 Essential (primary) hypertension: Secondary | ICD-10-CM | POA: Diagnosis not present

## 2022-04-22 DIAGNOSIS — I6523 Occlusion and stenosis of bilateral carotid arteries: Secondary | ICD-10-CM

## 2022-04-22 DIAGNOSIS — Z9889 Other specified postprocedural states: Secondary | ICD-10-CM

## 2022-04-22 DIAGNOSIS — I739 Peripheral vascular disease, unspecified: Secondary | ICD-10-CM

## 2022-04-22 DIAGNOSIS — I70213 Atherosclerosis of native arteries of extremities with intermittent claudication, bilateral legs: Secondary | ICD-10-CM

## 2022-04-22 DIAGNOSIS — I509 Heart failure, unspecified: Secondary | ICD-10-CM | POA: Diagnosis not present

## 2022-04-22 DIAGNOSIS — E785 Hyperlipidemia, unspecified: Secondary | ICD-10-CM

## 2022-04-23 ENCOUNTER — Encounter (INDEPENDENT_AMBULATORY_CARE_PROVIDER_SITE_OTHER): Payer: Self-pay | Admitting: Vascular Surgery

## 2022-04-26 LAB — VAS US ABI WITH/WO TBI
Left ABI: 0.91
Right ABI: 0.63

## 2022-06-17 ENCOUNTER — Inpatient Hospital Stay
Admission: EM | Admit: 2022-06-17 | Discharge: 2022-06-20 | DRG: 291 | Disposition: A | Discharge status: Home / Self Care | Payer: Medicare PPO | Attending: Internal Medicine | Admitting: Internal Medicine

## 2022-06-17 ENCOUNTER — Other Ambulatory Visit
Admission: RE | Admit: 2022-06-17 | Discharge: 2022-06-17 | Disposition: A | Discharge status: Home / Self Care | Payer: Medicare PPO | Source: Ambulatory Visit | Attending: Physician Assistant | Admitting: Physician Assistant

## 2022-06-17 ENCOUNTER — Other Ambulatory Visit: Payer: Self-pay

## 2022-06-17 ENCOUNTER — Emergency Department: Payer: Medicare PPO

## 2022-06-17 DIAGNOSIS — I255 Ischemic cardiomyopathy: Secondary | ICD-10-CM

## 2022-06-17 DIAGNOSIS — I739 Peripheral vascular disease, unspecified: Secondary | ICD-10-CM | POA: Diagnosis present

## 2022-06-17 DIAGNOSIS — I2489 Other forms of acute ischemic heart disease: Secondary | ICD-10-CM | POA: Diagnosis present

## 2022-06-17 DIAGNOSIS — J810 Acute pulmonary edema: Secondary | ICD-10-CM | POA: Diagnosis present

## 2022-06-17 DIAGNOSIS — I509 Heart failure, unspecified: Secondary | ICD-10-CM

## 2022-06-17 DIAGNOSIS — Z8249 Family history of ischemic heart disease and other diseases of the circulatory system: Secondary | ICD-10-CM

## 2022-06-17 DIAGNOSIS — Z7901 Long term (current) use of anticoagulants: Secondary | ICD-10-CM | POA: Diagnosis not present

## 2022-06-17 DIAGNOSIS — Z8673 Personal history of transient ischemic attack (TIA), and cerebral infarction without residual deficits: Secondary | ICD-10-CM | POA: Diagnosis not present

## 2022-06-17 DIAGNOSIS — J449 Chronic obstructive pulmonary disease, unspecified: Secondary | ICD-10-CM | POA: Diagnosis present

## 2022-06-17 DIAGNOSIS — Z79899 Other long term (current) drug therapy: Secondary | ICD-10-CM

## 2022-06-17 DIAGNOSIS — Z955 Presence of coronary angioplasty implant and graft: Secondary | ICD-10-CM

## 2022-06-17 DIAGNOSIS — Z833 Family history of diabetes mellitus: Secondary | ICD-10-CM | POA: Diagnosis not present

## 2022-06-17 DIAGNOSIS — I161 Hypertensive emergency: Secondary | ICD-10-CM | POA: Diagnosis present

## 2022-06-17 DIAGNOSIS — E860 Dehydration: Secondary | ICD-10-CM | POA: Diagnosis present

## 2022-06-17 DIAGNOSIS — I272 Pulmonary hypertension, unspecified: Secondary | ICD-10-CM | POA: Diagnosis present

## 2022-06-17 DIAGNOSIS — J9601 Acute respiratory failure with hypoxia: Secondary | ICD-10-CM | POA: Diagnosis present

## 2022-06-17 DIAGNOSIS — I25110 Atherosclerotic heart disease of native coronary artery with unstable angina pectoris: Secondary | ICD-10-CM | POA: Diagnosis present

## 2022-06-17 DIAGNOSIS — D631 Anemia in chronic kidney disease: Secondary | ICD-10-CM | POA: Diagnosis present

## 2022-06-17 DIAGNOSIS — K219 Gastro-esophageal reflux disease without esophagitis: Secondary | ICD-10-CM | POA: Diagnosis present

## 2022-06-17 DIAGNOSIS — N179 Acute kidney failure, unspecified: Secondary | ICD-10-CM | POA: Diagnosis present

## 2022-06-17 DIAGNOSIS — I5043 Acute on chronic combined systolic (congestive) and diastolic (congestive) heart failure: Secondary | ICD-10-CM | POA: Diagnosis present

## 2022-06-17 DIAGNOSIS — N184 Chronic kidney disease, stage 4 (severe): Secondary | ICD-10-CM | POA: Diagnosis present

## 2022-06-17 DIAGNOSIS — I130 Hypertensive heart and chronic kidney disease with heart failure and stage 1 through stage 4 chronic kidney disease, or unspecified chronic kidney disease: Principal | ICD-10-CM | POA: Diagnosis present

## 2022-06-17 DIAGNOSIS — I200 Unstable angina: Secondary | ICD-10-CM

## 2022-06-17 DIAGNOSIS — I5023 Acute on chronic systolic (congestive) heart failure: Secondary | ICD-10-CM | POA: Diagnosis not present

## 2022-06-17 DIAGNOSIS — I5033 Acute on chronic diastolic (congestive) heart failure: Secondary | ICD-10-CM | POA: Diagnosis present

## 2022-06-17 DIAGNOSIS — Z823 Family history of stroke: Secondary | ICD-10-CM

## 2022-06-17 DIAGNOSIS — F17210 Nicotine dependence, cigarettes, uncomplicated: Secondary | ICD-10-CM | POA: Diagnosis present

## 2022-06-17 DIAGNOSIS — I2510 Atherosclerotic heart disease of native coronary artery without angina pectoris: Secondary | ICD-10-CM

## 2022-06-17 DIAGNOSIS — R0902 Hypoxemia: Secondary | ICD-10-CM | POA: Insufficient documentation

## 2022-06-17 DIAGNOSIS — I440 Atrioventricular block, first degree: Secondary | ICD-10-CM | POA: Diagnosis present

## 2022-06-17 DIAGNOSIS — R0602 Shortness of breath: Secondary | ICD-10-CM | POA: Insufficient documentation

## 2022-06-17 DIAGNOSIS — Z7902 Long term (current) use of antithrombotics/antiplatelets: Secondary | ICD-10-CM

## 2022-06-17 DIAGNOSIS — N2581 Secondary hyperparathyroidism of renal origin: Secondary | ICD-10-CM | POA: Diagnosis present

## 2022-06-17 DIAGNOSIS — D72828 Other elevated white blood cell count: Secondary | ICD-10-CM | POA: Diagnosis present

## 2022-06-17 DIAGNOSIS — J9621 Acute and chronic respiratory failure with hypoxia: Secondary | ICD-10-CM | POA: Diagnosis present

## 2022-06-17 LAB — BLOOD GAS, VENOUS
Acid-base deficit: 7.1 mmol/L — ABNORMAL HIGH (ref 0.0–2.0)
Bicarbonate: 19.3 mmol/L — ABNORMAL LOW (ref 20.0–28.0)
O2 Saturation: 84.7 %
Patient temperature: 37
pCO2, Ven: 41 mmHg — ABNORMAL LOW (ref 44–60)
pH, Ven: 7.28 (ref 7.25–7.43)
pO2, Ven: 62 mmHg — ABNORMAL HIGH (ref 32–45)

## 2022-06-17 LAB — BASIC METABOLIC PANEL
Anion gap: 15 (ref 5–15)
BUN: 60 mg/dL — ABNORMAL HIGH (ref 8–23)
CO2: 19 mmol/L — ABNORMAL LOW (ref 22–32)
Calcium: 8.9 mg/dL (ref 8.9–10.3)
Chloride: 103 mmol/L (ref 98–111)
Creatinine, Ser: 4.04 mg/dL — ABNORMAL HIGH (ref 0.61–1.24)
GFR, Estimated: 15 mL/min — ABNORMAL LOW (ref >60)
Glucose, Bld: 161 mg/dL — ABNORMAL HIGH (ref 70–99)
Potassium: 3.8 mmol/L (ref 3.5–5.1)
Sodium: 137 mmol/L (ref 135–145)

## 2022-06-17 LAB — TROPONIN I (HIGH SENSITIVITY)
Troponin I (High Sensitivity): 178 ng/L (ref <18)
Troponin I (High Sensitivity): 168 ng/L (ref <18)
Troponin I (High Sensitivity): 179 ng/L (ref <18)

## 2022-06-17 LAB — CBC
HCT: 37 % — ABNORMAL LOW (ref 39.0–52.0)
Hemoglobin: 12.1 g/dL — ABNORMAL LOW (ref 13.0–17.0)
MCH: 27.9 pg (ref 26.0–34.0)
MCHC: 32.7 g/dL (ref 30.0–36.0)
MCV: 85.3 fL (ref 80.0–100.0)
Platelets: 237 10*3/uL (ref 150–400)
RBC: 4.34 MIL/uL (ref 4.22–5.81)
RDW: 16.6 % — ABNORMAL HIGH (ref 11.5–15.5)
WBC: 12.6 10*3/uL — ABNORMAL HIGH (ref 4.0–10.5)
nRBC: 0 % (ref 0.0–0.2)

## 2022-06-17 LAB — HEPARIN LEVEL (UNFRACTIONATED): Heparin Unfractionated: >1.1 IU/mL — ABNORMAL HIGH (ref 0.30–0.70)

## 2022-06-17 LAB — APTT: aPTT: 45 seconds — ABNORMAL HIGH (ref 24–36)

## 2022-06-17 LAB — BRAIN NATRIURETIC PEPTIDE: B Natriuretic Peptide: 1440.5 pg/mL — ABNORMAL HIGH (ref 0.0–100.0)

## 2022-06-17 MED ORDER — ASPIRIN 81 MG PO CHEW
324.0000 mg | CHEWABLE_TABLET | Freq: Once | ORAL | Status: AC
  Administered 2022-06-17: 324 mg via ORAL
  Filled 2022-06-17: qty 4

## 2022-06-17 MED ORDER — NITROGLYCERIN IN D5W 200-5 MCG/ML-% IV SOLN
0.0000 ug/min | INTRAVENOUS | Status: AC
  Administered 2022-06-17: 100 ug/min via INTRAVENOUS
  Administered 2022-06-18: 70 ug/min via INTRAVENOUS
  Administered 2022-06-18: 90 ug/min via INTRAVENOUS
  Administered 2022-06-19: 105 ug/min via INTRAVENOUS
  Filled 2022-06-17 (×5): qty 250

## 2022-06-17 MED ORDER — ASPIRIN 81 MG PO TBEC: 81.0000 mg | DELAYED_RELEASE_TABLET | Freq: Every day | ORAL | Status: DC

## 2022-06-17 MED ORDER — METOPROLOL SUCCINATE ER 25 MG PO TB24
25.0000 mg | ORAL_TABLET | Freq: Every day | ORAL | Status: DC
  Administered 2022-06-18 – 2022-06-20 (×3): 25 mg via ORAL
  Filled 2022-06-17 (×3): qty 1

## 2022-06-17 MED ORDER — ONDANSETRON HCL 4 MG/2ML IJ SOLN: 4.0000 mg | Freq: Four times a day (QID) | INTRAMUSCULAR | Status: DC | PRN

## 2022-06-17 MED ORDER — ROSUVASTATIN CALCIUM 10 MG PO TABS
20.0000 mg | ORAL_TABLET | Freq: Every day | ORAL | Status: DC
  Administered 2022-06-18 – 2022-06-20 (×3): 20 mg via ORAL
  Filled 2022-06-17 (×2): qty 2
  Filled 2022-06-17: qty 1

## 2022-06-17 MED ORDER — HEPARIN (PORCINE) 25000 UT/250ML-% IV SOLN
1200.0000 [IU]/h | INTRAVENOUS | Status: DC
  Administered 2022-06-17 – 2022-06-18 (×2): 1100 [IU]/h via INTRAVENOUS
  Filled 2022-06-17 (×3): qty 250

## 2022-06-17 MED ORDER — FUROSEMIDE 10 MG/ML IJ SOLN
40.0000 mg | Freq: Once | INTRAMUSCULAR | Status: AC
  Administered 2022-06-17: 40 mg via INTRAVENOUS
  Filled 2022-06-17: qty 4

## 2022-06-17 MED ORDER — HYDRALAZINE HCL 50 MG PO TABS
100.0000 mg | ORAL_TABLET | Freq: Three times a day (TID) | ORAL | Status: DC
  Administered 2022-06-18 – 2022-06-20 (×8): 100 mg via ORAL
  Filled 2022-06-17 (×8): qty 2

## 2022-06-17 MED ORDER — ALBUTEROL SULFATE HFA 108 (90 BASE) MCG/ACT IN AERS: 2.0000 | INHALATION_SPRAY | Freq: Four times a day (QID) | RESPIRATORY_TRACT | Status: DC | PRN

## 2022-06-17 MED ORDER — ALBUTEROL SULFATE (2.5 MG/3ML) 0.083% IN NEBU
2.5000 mg | INHALATION_SOLUTION | Freq: Four times a day (QID) | RESPIRATORY_TRACT | Status: DC | PRN
  Filled 2022-06-17: qty 3

## 2022-06-17 MED ORDER — CLOPIDOGREL BISULFATE 75 MG PO TABS
75.0000 mg | ORAL_TABLET | Freq: Every day | ORAL | Status: DC
  Administered 2022-06-18 – 2022-06-20 (×3): 75 mg via ORAL
  Filled 2022-06-17 (×3): qty 1

## 2022-06-17 MED ORDER — FUROSEMIDE 10 MG/ML IJ SOLN
40.0000 mg | Freq: Two times a day (BID) | INTRAMUSCULAR | Status: DC
  Administered 2022-06-18: 40 mg via INTRAVENOUS
  Filled 2022-06-17: qty 4

## 2022-06-17 MED ORDER — ACETAMINOPHEN 325 MG PO TABS: 650.0000 mg | ORAL_TABLET | ORAL | Status: DC | PRN

## 2022-06-17 MED ORDER — HEPARIN BOLUS VIA INFUSION
4000.0000 [IU] | Freq: Once | INTRAVENOUS | Status: AC
  Administered 2022-06-17: 4000 [IU] via INTRAVENOUS
  Filled 2022-06-17: qty 4000

## 2022-06-17 MED ORDER — NITROGLYCERIN 0.4 MG SL SUBL: 0.4000 mg | SUBLINGUAL_TABLET | SUBLINGUAL | Status: DC | PRN

## 2022-06-17 NOTE — Assessment & Plan Note (Addendum)
Ischemic cardiomyopathy Acute respiratory failure with hypoxia Hypertensive emergency Patient presents with shortness of breath, chest pain, SBP over 200, O2 sat 67% currently on BiPAP History of systolic CHF with recovered EF of 55 to 60% 10/2021 -Continue IV Lasix, nitroglycerin infusion -Continue metoprolol,, hydralazine -Daily weights with intake and output monitoring -Echocardiogram -Cardiology consult

## 2022-06-17 NOTE — ED Notes (Addendum)
MD notified of critical value  

## 2022-06-17 NOTE — Assessment & Plan Note (Signed)
CAD s/p PCI History of cardiac cath 03/2021 with stent placement Patient presents with chest pain in the setting of CHF exacerbation, ST depression inferolateral leads, troponin elevation to 168, flat from prior done a few hours earlier at PCPs office of 178 - Continue heparin infusion - Continue nitroglycerin infusion to titrate to pain as well as blood pressure - Continue metoprolol, rosuvastatin - Echocardiogram to evaluate for segmental wall motion abnormality - Cardiology consult

## 2022-06-17 NOTE — H&P (Signed)
History and Physical    Patient: Gary Mccoy. [id number] DOB: Jan 10, 1951 DOA: 06/17/2022 DOS: the patient was seen and examined on 06/17/2022 PCP: Rusty Aus, MD  Patient coming from: Home  Chief Complaint:  Chief Complaint  Patient presents with   Chest Pain    HPI: Gary Mccoy. is a 72 y.o. male with medical history significant for CAD s/p stent angioplasty, prior stroke with bilateral carotid artery stenosis,, PAD status post aortobilateral femoral artery bypass in 2017, on Eliquis,, pulmonary hypertension, systolic CHF with improved EF (55 to 60% 10/2021), secondary to ischemic ischemic cardiomyopathy, hypertension, CKD stage IV,   who presents to the ED by EMS with shortness of breath and chest pressure that started about 40 minutes prior to arrival.  He took 2 sublingual nitroglycerin without improvement.  On arrival of EMS, patient appeared pale diaphoretic and had labored breathing with O2 sat 67% and was placed on NRB for transport.  On arrival to the ED he was transitioned to BiPAP. ED course and data review: BP as high as 220/98 on arrival with pulse in the 1 teens, respiration mostly in the 30s up to 40 and O2 sat as low as 67%.  VBG on BiPAP showed pH 7.28 and pCO2 41. Troponin 168, down from 178 done earlier at PCPs office.  BNP pending.  CBC notable for leukocytosis of 12,600.  BMP notable for creatinine of 4.04 up from baseline of 3.5 with bicarb of 19. EKG, personally viewed and interpreted showing sinus tachycardia at 99 with ST depression in inferolateral leads that were there previously Chest x-ray suggesting CHF and pulmonary edema in the right lower lung field possible multifocal pneumonia. Patient was treated with chewable aspirin, IV Lasix, started on a nitroglycerin infusion as well as heparin infusion.  Hospitalist consulted for admission.   Review of Systems: As mentioned in the history of present illness. All other systems reviewed and are  negative.  Past Medical History:  Diagnosis Date   Arthritis    lower back   Back pain    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    CHF (congestive heart failure)    Chronic kidney disease    Complication of anesthesia    Violent with hallucinations after procedure 10/2015   Coronary artery disease    DDD (degenerative disc disease), lumbar    Duodenal ulcer    Elevated lipids    GERD (gastroesophageal reflux disease)    Leg weakness, bilateral    s/p lumbar surgery   PAD (peripheral artery disease)    Polyradiculitis    Stroke    Rt lower leg  estimated 10 - 12 stokes in last 5 years   Wears dentures    full upper and lower   Past Surgical History:  Procedure Laterality Date   AORTA - BILATERAL FEMORAL ARTERY BYPASS GRAFT N/A 11/08/2015   Procedure: AORTA BIFEMORAL BYPASS GRAFT;  Surgeon: Katha Cabal, MD;  Location: [facility];  Service: Vascular;  Laterality: N/A;   BACK SURGERY  2005   CATARACT EXTRACTION W/PHACO Right 02/20/2022   Procedure: CATARACT EXTRACTION PHACO AND INTRAOCULAR LENS PLACEMENT ([place]) RIGHT;  Surgeon: Leandrew Koyanagi, MD;  Location: [place];  Service: Ophthalmology;  Laterality: Right;  5.77 0:52.3   CATARACT EXTRACTION W/PHACO Left 03/06/2022   Procedure: CATARACT EXTRACTION PHACO AND INTRAOCULAR LENS PLACEMENT ([place]) LEFT;  Surgeon: Leandrew Koyanagi, MD;  Location: [place];  Service: Ophthalmology;  Laterality: Left;  5.77 1:07.5   CERVICAL DISC  SURGERY  1987   COLONOSCOPY WITH PROPOFOL N/A 12/30/2017   Procedure: COLONOSCOPY WITH PROPOFOL;  Surgeon: Tol[place], Benay Pike, MD;  Location: [facility];  Service: Gastroenterology;  Laterality: N/A;   COLONOSCOPY WITH PROPOFOL N/A 02/02/2021   Procedure: COLONOSCOPY WITH PROPOFOL;  Surgeon: Lesly Rubenstein, MD;  Location: [facility];  Service: Endoscopy;  Laterality: N/A;  [place]   ENDARTERECTOMY FEMORAL Bilateral 11/08/2015   Procedure: ENDARTERECTOMY FEMORAL;  Surgeon: Katha Cabal, MD;  Location: [facility];  Service: Vascular;  Laterality: Bilateral;  common, SFA, and profundis  Aortic endarterectomy    ESOPHAGOGASTRODUODENOSCOPY (EGD) WITH PROPOFOL N/A 09/04/2015   Procedure: ESOPHAGOGASTRODUODENOSCOPY (EGD) WITH PROPOFOL;  Surgeon: Lucilla Lame, MD;  Location: [place];  Service: Endoscopy;  Laterality: N/A;   ESOPHAGOGASTRODUODENOSCOPY (EGD) WITH PROPOFOL N/A 02/02/2021   Procedure: ESOPHAGOGASTRODUODENOSCOPY (EGD) WITH PROPOFOL;  Surgeon: Lesly Rubenstein, MD;  Location: [facility];  Service: Endoscopy;  Laterality: N/A;  Melena   LUMBAR LAMINECTOMY  2006   RIGHT/LEFT HEART CATH AND CORONARY ANGIOGRAPHY Bilateral 03/28/2021   Procedure: RIGHT/LEFT HEART CATH AND CORONARY ANGIOGRAPHY;  Surgeon: Yolonda Kida, MD;  Location: [place] [facility];  Service: Cardiovascular;  Laterality: Bilateral;   SEPTOPLASTY N/A 09/28/2020   Procedure: SEPTOPLASTY;  Surgeon: Margaretha Sheffield, MD;  Location: [place];  Service: ENT;  Laterality: N/A;   TEE WITHOUT CARDIOVERSION N/A 09/22/2015   Procedure: TRANSESOPHAGEAL ECHOCARDIOGRAM (TEE);  Surgeon: Teodoro Spray, MD;  Location: [facility];  Service: Cardiovascular;  Laterality: N/A;   TURBINATE REDUCTION Bilateral 09/28/2020   Procedure: TURBINATE REDUCTION;  Surgeon: Margaretha Sheffield, MD;  Location: [place];  Service: ENT;  Laterality: Bilateral;   Social History:  reports that he has been smoking cigarettes. He has a 45.00 pack-year smoking history. He has never used smokeless tobacco. He reports current alcohol use. He reports that he does not use drugs.  No Known Allergies  Family History  Problem Relation Age of Onset   Heart attack Mother    Hypertension Mother    Varicose Veins Mother    Diabetes Father    Heart attack Father    Heart attack Maternal Grandmother    Stroke Maternal Grandmother     Prior to Admission medications   Medication Sig Start Date End Date Taking?  Authorizing Provider  albuterol (VENTOLIN HFA) 108 (90 Base) MCG/ACT inhaler Inhale 2 puffs into the lungs every 6 (six) hours as needed for wheezing or shortness of breath. 02/06/21   Nicole Kindred A, DO  amLODipine (NORVASC) 5 MG tablet Take 5 mg by mouth 2 (two) times daily.    [provider]  apixaban (ELIQUIS) 2.5 MG TABS tablet Take 2.5 mg by mouth 2 (two) times daily. 03/29/22   [provider]  clopidogrel (PLAVIX) 75 MG tablet Take 75 mg by mouth daily. 04/16/21   [provider]  Coenzyme Q10 (COQ10) 200 MG CAPS Take 200 mg by mouth daily.    [provider]  furosemide (LASIX) 40 MG tablet Take 1 tablet (40 mg total) by mouth daily. for weight gain or swelling 10/30/21   Lorella Nimrod, MD  hydrALAZINE (APRESOLINE) 100 MG tablet Take 100 mg by mouth 3 (three) times daily.    [provider]  Iron-Vitamin C (VITRON-C) 65-125 MG TABS Take 1 tablet by mouth daily.    [provider]  isosorbide mononitrate (IMDUR) 120 MG 24 hr tablet Take 120 mg by mouth daily. 11/16/21   [provider]  metoprolol succinate (TOPROL-XL) 25 MG 24 hr tablet Take 1 tablet (25 mg total) by mouth daily. 02/07/21   Ezekiel Slocumb, DO  Multiple Vitamins-Minerals (MULTIVITAMIN ADULT PO) Take 1 tablet by mouth daily. Men    [provider]  Omega-3 Fatty Acids (FISH OIL PO) Take by mouth.    [provider]  rosuvastatin (CRESTOR) 40 MG tablet Take 20 mg by mouth daily.    [provider]  vitamin B-12 (CYANOCOBALAMIN) 1000 MCG tablet Take 1,000 mcg by mouth daily.    [provider]    Physical Exam: Vitals:   06/17/22 1900 06/17/22 1905 06/17/22 1915 06/17/22 1941  BP: (!) 170/95 (!) 155/80 (!) 167/106   Pulse: (!) 32 94 91   Resp: (!) 29 (!) 30 (!) 22   SpO2: 100% 96% 100%   Weight:    86.6 kg  Height:    6\' 1"  (1.854 m)   Physical Exam Vitals and nursing note reviewed.  Constitutional:      Comments:  On bipap  HENT:     Head: Normocephalic and atraumatic.  Cardiovascular:     Rate and Rhythm: Normal rate and regular rhythm.     Heart sounds: Normal heart sounds.  Pulmonary:     Effort: Tachypnea present.     Breath sounds: Normal breath sounds.  Abdominal:     Palpations: Abdomen is soft.     Tenderness: There is no abdominal tenderness.  Musculoskeletal:     Right lower leg: Edema present.     Left lower leg: Edema present.  Neurological:     Mental Status: Mental status is at baseline.     Labs on Admission: I have personally reviewed following labs and imaging studies  CBC: Recent Labs  Lab 06/17/22 1724  WBC 12.6*  HGB 12.1*  HCT 37.0*  MCV 85.3  PLT 123XX123   Basic Metabolic Panel: Recent Labs  Lab 06/17/22 1724  NA 137  K 3.8  CL 103  CO2 19*  GLUCOSE 161*  BUN 60*  CREATININE 4.04*  CALCIUM 8.9   GFR: Estimated Creatinine Clearance: 19.2 mL/min (A) (by C-G formula based on SCr of 4.04 mg/dL (H)). Liver Function Tests: No results for input(s): "AST", "ALT", "ALKPHOS", "BILITOT", "PROT", "ALBUMIN" in the last 168 hours. No results for input(s): "LIPASE", "AMYLASE" in the last 168 hours. No results for input(s): "AMMONIA" in the last 168 hours. Coagulation Profile: No results for input(s): "INR", "PROTIME" in the last 168 hours. Cardiac Enzymes: No results for input(s): "CKTOTAL", "CKMB", "CKMBINDEX", "TROPONINI" in the last 168 hours. BNP (last 3 results) No results for input(s): "PROBNP" in the last 8760 hours. HbA1C: No results for input(s): "HGBA1C" in the last 72 hours. CBG: No results for input(s): "GLUCAP" in the last 168 hours. Lipid Profile: No results for input(s): "CHOL", "HDL", "LDLCALC", "TRIG", "CHOLHDL", "LDLDIRECT" in the last 72 hours. Thyroid Function Tests: No results for input(s): "TSH", "T4TOTAL", "FREET4", "T3FREE", "THYROIDAB" in the last 72 hours. Anemia Panel: No results for input(s): "VITAMINB12", "FOLATE", "FERRITIN",  "TIBC", "IRON", "RETICCTPCT" in the last 72 hours. Urine analysis:    Component Value Date/Time   COLORURINE STRAW (A) 10/29/2021 0030   APPEARANCEUR CLEAR (A) 10/29/2021 0030   LABSPEC 1.005 10/29/2021 0030   PHURINE 5.0 10/29/2021 0030   GLUCOSEU NEGATIVE 10/29/2021 0030   HGBUR NEGATIVE 10/29/2021 0030   BILIRUBINUR NEGATIVE 10/29/2021 0030   KETONESUR NEGATIVE 10/29/2021 0030   PROTEINUR NEGATIVE 10/29/2021 0030   NITRITE NEGATIVE 10/29/2021  0030   LEUKOCYTESUR NEGATIVE 10/29/2021 0030    Radiological Exams on Admission: DG Chest Port 1 View  Result Date: 06/17/2022 CLINICAL DATA:  Shortness of breath EXAM: PORTABLE CHEST 1 VIEW COMPARISON:  10/29/2021 FINDINGS: Transverse diameter of heart is within normal limits. Central pulmonary vessels are more prominent. Increased interstitial markings are seen in both lungs, more so in right lower lung field. There is blunting of right lateral CP angle. There is no pneumothorax. IMPRESSION: Central pulmonary vessels are more prominent suggesting CHF. Increased interstitial markings are seen in both lungs, more so in the right lower lung field. This may suggest pulmonary edema or multifocal pneumonia. Small right pleural effusion. Electronically Signed   By: Elmer Picker M.D.   On: 06/17/2022 17:48     Data Reviewed: Relevant notes from primary care and specialist visits, past discharge summaries as available in EHR, including Care Everywhere. Prior diagnostic testing as pertinent to current admission diagnoses Updated medications and problem lists for reconciliation ED course, including vitals, labs, imaging, treatment and response to treatment Triage notes, nursing and pharmacy notes and ED provider's notes Notable results as noted in HPI   Assessment and Plan: * Acute on chronic systolic CHF (congestive heart failure) Ischemic cardiomyopathy Acute respiratory failure with hypoxia Hypertensive emergency Patient presents with  shortness of breath, chest pain, SBP over 200, O2 sat 67% currently on BiPAP History of systolic CHF with recovered EF of 55 to 60% 10/2021 -Continue IV Lasix, nitroglycerin infusion -Continue metoprolol,, hydralazine -Daily weights with intake and output monitoring -Echocardiogram -Cardiology consult   Unstable angina CAD s/p PCI History of cardiac cath 03/2021 with stent placement Patient presents with chest pain in the setting of CHF exacerbation, ST depression inferolateral leads, troponin elevation to 168, flat from prior done a few hours earlier at PCPs office of 178 - Continue heparin infusion - Continue nitroglycerin infusion to titrate to pain as well as blood pressure - Continue metoprolol, rosuvastatin - Echocardiogram to evaluate for segmental wall motion abnormality - Cardiology consult  PAD s/p aortobifemoral bypass 2017 (peripheral artery disease) History of CVA and bilateral carotid artery stenosis Chronic anticoagulation Holding Eliquis as patient on IV heparin Continue rosuvastatin   Acute renal failure superimposed on stage 4 chronic kidney disease Renal function slightly lower than baseline, creatinine for baseline 3.5 Avoid nephrotoxins Monitor renal function given IV Lasix for treatment of CHF exacerbation        DVT prophylaxis: Heparin infusion  Consults: [facility] cardiology Dr. Clayborn Bigness    Advance Care Planning:   Code Status: Prior   Family Communication: none  Disposition Plan: Back to previous home environment  Severity of Illness: The appropriate patient status for this patient is INPATIENT. Inpatient status is judged to be reasonable and necessary in order to provide the required intensity of service to ensure the patient's safety. [name] presenting symptoms, physical exam findings, and initial radiographic and laboratory data in the context of their chronic comorbidities is felt to place them at high risk for further clinical  deterioration. Furthermore, it is not anticipated that the patient will be medically stable for discharge from the hospital within 2 midnights of admission.   * I certify that at the point of admission it is my clinical judgment that the patient will require inpatient hospital care spanning beyond 2 midnights from the point of admission due to high intensity of service, high risk for further deterioration and high frequency of surveillance required.*  Author: Athena Masse, MD 06/17/2022  8:04 PM  For on call review [email or web address].

## 2022-06-17 NOTE — ED Provider Notes (Signed)
[facility] Provider Note    Event Date/Time   First MD Initiated Contact with Patient 06/17/22 1724     (approximate)   History   Chest Pain   HPI  Gurnie A Shreyas Dickhaut. is a 72 y.o. male with past medical history coronary artery disease, peripheral artery disease status post aortobifemoral bypass, pulmonary hypertension, heart failure hypertension CKD who presents because of shortness of breath.  Patient tells me has been feeling short of breath for the last 4 days.  Describes pain in his chest as well but when asked about the nature of this he says it is just short of breath and that his chest hurts because it is difficult to breathe.  Denies any frank pressure or stabbing pain.  Symptoms have been worsening over the last several days.  Denies cough fevers chills.  Unsure of lower extremity swelling.  Tells me he saw his primary care doctor today and got a phone call that his kidney function had worsened that he was severely dehydrated.  Says he has been eating and drinking normally.     Past Medical History:  Diagnosis Date   Arthritis    lower back   Back pain    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    CHF (congestive heart failure)    Chronic kidney disease    Complication of anesthesia    Violent with hallucinations after procedure 10/2015   Coronary artery disease    DDD (degenerative disc disease), lumbar    Duodenal ulcer    Elevated lipids    GERD (gastroesophageal reflux disease)    Leg weakness, bilateral    s/p lumbar surgery   PAD (peripheral artery disease)    Polyradiculitis    Stroke    Rt lower leg  estimated 10 - 12 stokes in last 5 years   Wears dentures    full upper and lower    Patient Active Problem List   Diagnosis Date Noted   Acute exacerbation of CHF (congestive heart failure) 10/29/2021   CKD (chronic kidney disease) stage 4, GFR 15-29 ml/min 10/29/2021   AKI (acute kidney injury) 10/29/2021   Atherosclerosis of native  arteries of extremity with intermittent claudication 0000000   Acute systolic (congestive) heart failure 01/31/2021   Melena 01/30/2021   Anemia 01/30/2021   Shortness of breath 01/30/2021   Acute respiratory failure with hypoxia 01/29/2021   CVA (cerebral vascular accident) 02/26/2018   Tubular adenoma 10/29/2016   Bilateral carotid artery stenosis 04/10/2016   Hyperlipidemia 01/01/2016   B12 deficiency 12/29/2015   Weakness of right lower extremity 09/19/2015   Tobacco abuse counseling 09/19/2015   Peripheral vascular disease 09/19/2015   Leukocytosis 09/19/2015   CVA (cerebral infarction) 09/19/2015   Abnormal findings-gastrointestinal tract    Gastritis    Duodenal ulcer disease    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 08/29/2015   Contact with and (suspected ) exposure to other hazardous substances 01/27/2014   Exposure to hazardous material 01/27/2014   Degeneration of intervertebral disc of lumbar region 11/12/2013   Degeneration of intervertebral disc of lumbosacral region 11/12/2013   Neuritis or radiculitis due to rupture of lumbar intervertebral disc 11/12/2013   Thoracic neuritis 11/12/2013   Benign essential HTN 02/01/2013     Physical Exam  Triage Vital Signs: ED Triage Vitals  Enc Vitals Group     BP 06/17/22 1713 (!) 172/87     Pulse Rate 06/17/22 1717 (!) 109     Resp 06/17/22 1713 (!) 30  Temp --      Temp src --      SpO2 06/17/22 1717 (!) 67 %     Weight --      Height --      Head Circumference --      Peak Flow --      Pain Score --      Pain Loc --      Pain Edu? --      Excl. in [place]? --     Most recent vital signs: Vitals:   06/17/22 1810 06/17/22 1815  BP: (!) 184/117 (!) 166/82  Pulse: 100 75  Resp: (!) 38 (!) 32  SpO2: 98% 100%     General: Awake, she is diaphoretic and respiratory distress CV:  Good peripheral perfusion.  Resp:  Normal effort.  Crackles and wheezing patient is tachypneic breathing in the 40s with accessory muscle use  able to speak in short sentences Abd:  No distention.  Neuro:             Awake, Alert, Oriented x 3  Other:  Pitting edema bilateral lower extremities   ED Results / Procedures / Treatments  Labs (all labs ordered are listed, but only abnormal results are displayed) Labs Reviewed  BASIC METABOLIC PANEL - Abnormal; Notable for the following components:      Result Value   CO2 19 (*)    Glucose, Bld 161 (*)    BUN 60 (*)    Creatinine, Ser 4.04 (*)    GFR, Estimated 15 (*)    All other components within normal limits  CBC - Abnormal; Notable for the following components:   WBC 12.6 (*)    Hemoglobin 12.1 (*)    HCT 37.0 (*)    RDW 16.6 (*)    All other components within normal limits  BLOOD GAS, VENOUS - Abnormal; Notable for the following components:   pCO2, Ven 41 (*)    pO2, Ven 62 (*)    Bicarbonate 19.3 (*)    Acid-base deficit 7.1 (*)    All other components within normal limits  TROPONIN I (HIGH SENSITIVITY) - Abnormal; Notable for the following components:   Troponin I (High Sensitivity) 168 (*)    All other components within normal limits  BRAIN NATRIURETIC PEPTIDE     EKG  EKG reviewed interpreted myself shows sinus tachycardia inferior ST depression in lead V5 V6, ST elevation in aVR   RADIOLOGY Chest x-ray reviewed interpreted myself shows pulmonary edema   PROCEDURES:  Critical Care performed: Yes, see critical care procedure note(s)  .1-3 Lead EKG Interpretation  Performed by: Rada Hay, MD Authorized by: Rada Hay, MD     Interpretation: abnormal     ECG rate assessment: tachycardic     Rhythm: sinus tachycardia     Ectopy: none     Conduction: normal   .Critical Care  Performed by: Rada Hay, MD Authorized by: Rada Hay, MD   Critical care provider statement:    Critical care time (minutes):  30   Critical care was time spent personally by me on the following activities:  Development of treatment plan  with patient or surrogate, discussions with consultants, evaluation of patient's response to treatment, examination of patient, ordering and review of laboratory studies, ordering and review of radiographic studies, ordering and performing treatments and interventions, pulse oximetry, re-evaluation of patient's condition and review of old charts   The patient is on the cardiac monitor  to evaluate for evidence of arrhythmia and/or significant heart rate changes.   MEDICATIONS ORDERED IN ED: Medications  nitroGLYCERIN 50 mg in dextrose 5 % 250 mL (0.2 mg/mL) infusion (300 mcg/min Intravenous Rate/Dose Change 06/17/22 1809)  furosemide (LASIX) injection 40 mg (has no administration in time range)  aspirin chewable tablet 324 mg (324 mg Oral Given 06/17/22 1735)     IMPRESSION / MDM / ASSESSMENT AND PLAN / ED COURSE  I reviewed the triage vital signs and the nursing notes.                              Patient's presentation is most consistent with acute presentation with potential threat to life or bodily function.  Differential diagnosis includes, but is not limited to, ACS, acute pulmonary edema secondary to hypertensive emergency, decompensated CHF  Patient is a 72 year old male with history of coronary artery disease and CHF who presents with shortness of breath and chest pain x 4 days.  He endorses dyspnea and when asked about what his chest pain feels like he really says that it is the shortness of breath that is a primary complaint denying any chest pressure or sharp pain.  Has no cough.  Says has not been urinating as much.  Saw his doctor today who ordered cardiac enzymes and renal function which had bumped.  On arrival to ED patient is in respiratory distress with sats in the 60s.  Blood pressure in the [id number] systolic with respiratory rate in the 40s.  Patient acutely looking ill speaking in short sentences and diaphoretic.  Rales on auscultation and he does have pitting edema.  Suspect  scape.  Patient has already received sublingual nitro.  Will start high-dose nitroglycerin drip and placed on BiPAP.  Performed bedside ultrasound which shows diffuse B-lines and reduced EF.  EKG shows ST depression inferiorly and laterally with some minimal ST elevation in aVR.  Does look similar morphology to prior EKG.  Patient continues to have chest pain will discuss with cardiology.  Looking somewhat improved on BiPAP still quite hypertensive.  Did increase nitroglycerin up to 300 and blood pressure did improve to the 140s so titrating back down.  Patient says the chest pain is essentially resolved.  Respiratory distress is also improving.  Chest x-ray does show pulmonary edema.  Troponin elevated at 168.  3 hours ago troponin have been drawn by primary care which was 178.  His renal function has worsened creatinine is 4 BUN 60.  He is mildly anemic leukocytosis to 12.6.         FINAL CLINICAL IMPRESSION(S) / ED DIAGNOSES   Final diagnoses:  Acute pulmonary edema     Rx / DC Orders   ED Discharge Orders     None        Note:  This document was prepared using Dragon voice recognition software and may include unintentional dictation errors.   Rada Hay, MD 06/17/22 [phone]

## 2022-06-17 NOTE — Assessment & Plan Note (Signed)
Renal function slightly lower than baseline, creatinine for baseline 3.5 Avoid nephrotoxins Monitor renal function given IV Lasix for treatment of CHF exacerbation

## 2022-06-17 NOTE — Assessment & Plan Note (Addendum)
History of CVA and bilateral carotid artery stenosis Chronic anticoagulation Holding Eliquis as patient on IV heparin Continue rosuvastatin

## 2022-06-17 NOTE — ED Triage Notes (Addendum)
Pt to ED via POV from home. Pt reports CP that started 30-79mins PTA. Pt is pale, diaphoretic and labored on arrival. Pt with hx MI. Pt is on Eliquis. Pt took 2 SL nitro PTA. Pt oxygen reading 67% in triage, pt placed on NRB. Pt take to room 7 immediately. RT called

## 2022-06-17 NOTE — Progress Notes (Signed)
ANTICOAGULATION CONSULT NOTE - Initial Consult  Pharmacy Consult for Heparin drip  (apixaban PTA) Indication: chest pain/ACS  No Known Allergies  Patient Measurements: Height: 6\' 1"  (185.4 cm) Weight: 86.6 kg (191 lb) IBW/kg (Calculated) : 79.9 Heparin Dosing Weight:    Vital Signs: BP: 167/106 (04/01 1915) Pulse Rate: 91 (04/01 1915)  Labs: Recent Labs    06/17/22 1457 06/17/22 1724  HGB  --  12.1*  HCT  --  37.0*  PLT  --  237  CREATININE  --  4.04*  TROPONINIHS 178* 168*    Estimated Creatinine Clearance: 19.2 mL/min (A) (by C-G formula based on SCr of 4.04 mg/dL (H)).   Medical History: Past Medical History:  Diagnosis Date   Arthritis    lower back   Back pain    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    CHF (congestive heart failure)    Chronic kidney disease    Complication of anesthesia    Violent with hallucinations after procedure 10/2015   Coronary artery disease    DDD (degenerative disc disease), lumbar    Duodenal ulcer    Elevated lipids    GERD (gastroesophageal reflux disease)    Leg weakness, bilateral    s/p lumbar surgery   PAD (peripheral artery disease)    Polyradiculitis    Stroke    Rt lower leg  estimated 10 - 12 stokes in last 5 years   Wears dentures    full upper and lower    Medications:  (Not in a hospital admission)  Scheduled:   heparin  4,000 Units Intravenous Once   Infusions:   heparin     nitroGLYCERIN 125 mcg/min (06/17/22 1939)    Assessment: 72 yo M to start Heparin drip for ACS, chest pain, SHOB. Patient on apixaban PTA( ?PAD,not sure of indication)- last dose apixaban 06/17/22 at 0900 Hgb 12.1  plt 237   Scr 4.04 Baseline: aPTT pending   Heparin level pending   Goal of Therapy:  aPTT 66-102 Heparin level 0.3-0.7 units/ml Monitor platelets by anticoagulation protocol: Yes   Plan:  Will order bolus of 400 units heparin and continue with Heparin drip at 1100 units/hr. Will check aPTT in 8 hours -f/u for  correlation of aPTT and Heparin level -CBC daily  Estiben Mizuno A 06/17/2022,7:46 PM

## 2022-06-18 ENCOUNTER — Inpatient Hospital Stay
Admit: 2022-06-18 | Discharge: 2022-06-18 | Disposition: A | Discharge status: Home / Self Care | Payer: Medicare PPO | Attending: Internal Medicine | Admitting: Internal Medicine

## 2022-06-18 ENCOUNTER — Other Ambulatory Visit: Payer: Self-pay

## 2022-06-18 ENCOUNTER — Encounter: Payer: Self-pay | Admitting: Internal Medicine

## 2022-06-18 DIAGNOSIS — I509 Heart failure, unspecified: Secondary | ICD-10-CM

## 2022-06-18 DIAGNOSIS — I5023 Acute on chronic systolic (congestive) heart failure: Secondary | ICD-10-CM | POA: Diagnosis not present

## 2022-06-18 LAB — CBC
HCT: 29 % — ABNORMAL LOW (ref 39.0–52.0)
Hemoglobin: 9.4 g/dL — ABNORMAL LOW (ref 13.0–17.0)
MCH: 27.2 pg (ref 26.0–34.0)
MCHC: 32.4 g/dL (ref 30.0–36.0)
MCV: 83.8 fL (ref 80.0–100.0)
Platelets: 172 10*3/uL (ref 150–400)
RBC: 3.46 MIL/uL — ABNORMAL LOW (ref 4.22–5.81)
RDW: 16.5 % — ABNORMAL HIGH (ref 11.5–15.5)
WBC: 6.9 10*3/uL (ref 4.0–10.5)
nRBC: 0 % (ref 0.0–0.2)

## 2022-06-18 LAB — BASIC METABOLIC PANEL
Anion gap: 14 (ref 5–15)
BUN: 62 mg/dL — ABNORMAL HIGH (ref 8–23)
CO2: 20 mmol/L — ABNORMAL LOW (ref 22–32)
Calcium: 8.4 mg/dL — ABNORMAL LOW (ref 8.9–10.3)
Chloride: 103 mmol/L (ref 98–111)
Creatinine, Ser: 3.53 mg/dL — ABNORMAL HIGH (ref 0.61–1.24)
GFR, Estimated: 18 mL/min — ABNORMAL LOW (ref >60)
Glucose, Bld: 111 mg/dL — ABNORMAL HIGH (ref 70–99)
Potassium: 3.5 mmol/L (ref 3.5–5.1)
Sodium: 137 mmol/L (ref 135–145)

## 2022-06-18 LAB — ECHOCARDIOGRAM COMPLETE
AR max vel: 2.35 cm2
AV Area VTI: 2.1 cm2
AV Area mean vel: 2.1 cm2
AV Mean grad: 3 mmHg
AV Peak grad: 6.1 mmHg
Ao pk vel: 1.23 m/s
Area-P 1/2: 5.42 cm2
Height: 73 in
MV VTI: 1.86 cm2
S' Lateral: 3.5 cm
Weight: 3056 oz

## 2022-06-18 LAB — GLUCOSE, CAPILLARY: Glucose-Capillary: 92 mg/dL (ref 70–99)

## 2022-06-18 LAB — APTT
aPTT: 93 seconds — ABNORMAL HIGH (ref 24–36)
aPTT: 65 seconds — ABNORMAL HIGH (ref 24–36)

## 2022-06-18 LAB — HIV ANTIBODY (ROUTINE TESTING W REFLEX): HIV Screen 4th Generation wRfx: NONREACTIVE

## 2022-06-18 MED ORDER — FUROSEMIDE 10 MG/ML IJ SOLN
40.0000 mg | Freq: Two times a day (BID) | INTRAMUSCULAR | Status: DC
  Administered 2022-06-18 (×2): 40 mg via INTRAVENOUS
  Filled 2022-06-18 (×2): qty 4

## 2022-06-18 MED ORDER — PERFLUTREN LIPID MICROSPHERE
1.0000 mL | INTRAVENOUS | Status: AC | PRN
  Administered 2022-06-18: 3 mL via INTRAVENOUS

## 2022-06-18 MED ORDER — POTASSIUM CHLORIDE CRYS ER 20 MEQ PO TBCR
40.0000 meq | EXTENDED_RELEASE_TABLET | Freq: Once | ORAL | Status: AC
  Administered 2022-06-18: 40 meq via ORAL
  Filled 2022-06-18: qty 2

## 2022-06-18 MED ORDER — FLUTICASONE PROPIONATE 50 MCG/ACT NA SUSP
1.0000 | Freq: Every day | NASAL | Status: DC
  Administered 2022-06-19 – 2022-06-20 (×2): 1 via NASAL
  Filled 2022-06-18 (×3): qty 16

## 2022-06-18 MED ORDER — ISOSORBIDE DINITRATE 30 MG PO TABS
30.0000 mg | ORAL_TABLET | Freq: Three times a day (TID) | ORAL | Status: DC
  Administered 2022-06-18 (×3): 30 mg via ORAL
  Filled 2022-06-18 (×4): qty 1

## 2022-06-18 MED ORDER — CHLORHEXIDINE GLUCONATE CLOTH 2 % EX PADS
6.0000 | MEDICATED_PAD | Freq: Every day | CUTANEOUS | Status: DC
  Administered 2022-06-18: 6 via TOPICAL

## 2022-06-18 MED ORDER — SALINE SPRAY 0.65 % NA SOLN
1.0000 | NASAL | Status: DC | PRN
  Administered 2022-06-20: 1 via NASAL
  Filled 2022-06-18: qty 44

## 2022-06-18 MED ORDER — SALINE SPRAY 0.65 % NA SOLN
1.0000 | Freq: Once | NASAL | Status: AC
  Administered 2022-06-18: 1 via NASAL
  Filled 2022-06-18: qty 44

## 2022-06-18 MED ORDER — HEPARIN BOLUS VIA INFUSION
1200.0000 [IU] | Freq: Once | INTRAVENOUS | Status: AC
  Administered 2022-06-18: 1200 [IU] via INTRAVENOUS
  Filled 2022-06-18: qty 1200

## 2022-06-18 NOTE — ED Notes (Signed)
ECHO at bedside.

## 2022-06-18 NOTE — ED Notes (Signed)
Report called to [facility]. No further questions.

## 2022-06-18 NOTE — ED Notes (Signed)
Pt complaining of pain and shortness of breath. Pt has labored breathing and tachypneic. Pt demanding for bipap to be placed. RN notified respiratory and placed pt back on bipap.

## 2022-06-18 NOTE — Progress Notes (Signed)
[place] for Heparin drip  (apixaban PTA) Indication: chest pain/ACS  No Known Allergies  Patient Measurements: Height: 6\' 1"  (185.4 cm) Weight: 86.6 kg (191 lb) IBW/kg (Calculated) : 79.9 Heparin Dosing Weight:    Vital Signs: Temp: 98.6 F (37 C) (04/02 0417) Temp Source: Oral (04/02 0417) BP: 153/79 (04/02 0600) Pulse Rate: 78 (04/02 0545)  Labs: Recent Labs    06/17/22 1457 06/17/22 1724 06/17/22 1944 06/18/22 0534  HGB  --  12.1*  --  9.4*  HCT  --  37.0*  --  29.0*  PLT  --  237  --  172  APTT  --   --  45* 93*  HEPARINUNFRC  --   --  >1.10*  --   CREATININE  --  4.04*  --  3.53*  TROPONINIHS 178* 168* 179*  --      Estimated Creatinine Clearance: 22 mL/min (A) (by C-G formula based on SCr of 3.53 mg/dL (H)).   Medical History: Past Medical History:  Diagnosis Date   Arthritis    lower back   Back pain    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    CHF (congestive heart failure)    Chronic kidney disease    Complication of anesthesia    Violent with hallucinations after procedure 10/2015   Coronary artery disease    DDD (degenerative disc disease), lumbar    Duodenal ulcer    Elevated lipids    GERD (gastroesophageal reflux disease)    Leg weakness, bilateral    s/p lumbar surgery   PAD (peripheral artery disease)    Polyradiculitis    Stroke    Rt lower leg  estimated 10 - 12 stokes in last 5 years   Wears dentures    full upper and lower    Medications:  (Not in a hospital admission) Scheduled:   aspirin EC  81 mg Oral Daily   clopidogrel  75 mg Oral Daily   furosemide  40 mg Intravenous Q12H   hydrALAZINE  100 mg Oral TID   metoprolol succinate  25 mg Oral Daily   rosuvastatin  20 mg Oral Daily   Infusions:   heparin 1,100 Units/hr (06/18/22 0514)   nitroGLYCERIN 100 mcg/min (06/18/22 0514)    Assessment: 72 yo M to start Heparin drip for ACS, chest pain, SHOB. Patient on apixaban PTA( ?PAD,not sure of  indication)- last dose apixaban 06/17/22 at 0900 Hgb 12.1  plt 237   Scr 4.04 Baseline: aPTT pending   Heparin level pending   Goal of Therapy:  aPTT 66-102 Heparin level 0.3-0.7 units/ml Monitor platelets by anticoagulation protocol: Yes   04/02 0534 aPTT 93, therapeutic x 1  Plan:  Continue with Heparin drip at 1100 units/hr. Will recheck aPTT in 8 hours to confirm -f/u for correlation of aPTT and Heparin level -CBC daily  Renda Rolls, PharmD, [facility] 06/18/2022 6:07 AM

## 2022-06-18 NOTE — ED Notes (Signed)
Report attempted. Rec RN not available. Number provided for call back.

## 2022-06-18 NOTE — ED Notes (Signed)
Per MD Posey Pronto, okay to start titrating the nitroglycerin drip down by 66mcg if pt maintaining around current SBP of 140.

## 2022-06-18 NOTE — Progress Notes (Signed)
[place] for Heparin drip  (apixaban PTA) Indication: chest pain/ACS  No Known Allergies  Patient Measurements: Height: 6\' 1"  (185.4 cm) Weight: 86.6 kg (191 lb) IBW/kg (Calculated) : 79.9 Heparin Dosing Weight:  86.6 kg  Vital Signs: Temp: 98 F (36.7 C) (04/02 1500) BP: 161/79 (04/02 1500) Pulse Rate: 83 (04/02 1500)  Labs: Recent Labs    06/17/22 1457 06/17/22 1724 06/17/22 1944 06/18/22 0534 06/18/22 1537  HGB  --  12.1*  --  9.4*  --   HCT  --  37.0*  --  29.0*  --   PLT  --  237  --  172  --   APTT  --   --  45* 93* 65*  HEPARINUNFRC  --   --  >1.10*  --   --   CREATININE  --  4.04*  --  3.53*  --   TROPONINIHS 178* 168* 179*  --   --     Estimated Creatinine Clearance: 22 mL/min (A) (by C-G formula based on SCr of 3.53 mg/dL (H)).  Medical History: Past Medical History:  Diagnosis Date   Arthritis    lower back   Back pain    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    CHF (congestive heart failure)    Chronic kidney disease    Complication of anesthesia    Violent with hallucinations after procedure 10/2015   Coronary artery disease    DDD (degenerative disc disease), lumbar    Duodenal ulcer    Elevated lipids    GERD (gastroesophageal reflux disease)    Leg weakness, bilateral    s/p lumbar surgery   PAD (peripheral artery disease)    Polyradiculitis    Stroke    Rt lower leg  estimated 10 - 12 stokes in last 5 years   Wears dentures    full upper and lower    Medications:  Scheduled:   clopidogrel  75 mg Oral Daily   fluticasone  1 spray Each Nare Daily   furosemide  40 mg Intravenous BID   hydrALAZINE  100 mg Oral TID   isosorbide dinitrate  30 mg Oral TID   metoprolol succinate  25 mg Oral Daily   rosuvastatin  20 mg Oral Daily   Infusions:   heparin 1,100 Units/hr (06/18/22 1220)   nitroGLYCERIN 95 mcg/min (06/18/22 0950)    Assessment: 72 yo male with PHM of CAD s/p stent angioplasty, prior stroke  with bilateral carotid artery stenosis, PAD status post aortobilateral femoral artery bypass in 2017, on Eliquis,, pulmonary hypertension, systolic CHF with improved EF (55 to 60% 10/2021), secondary to ischemic ischemic cardiomyopathy, hypertension, CKD stage IV. Admitted with chest pain. Pharmacy consulted to manage heparin infusion for ACS. to start Heparin drip   Last apixaban  dose 06/17/22 at 0900.  Baseline labs: Hgb 12.1  plt 237   Scr 4.04 aPTT & Heparin level ordered  Goal of Therapy:  aPTT 66-102 Heparin level 0.3-0.7 units/ml Monitor platelets by anticoagulation protocol: Yes   Date/time aPTT/HL Comments 04/02 0534 aPTT 93 therapeutic x 1 0402 1537 aPTT 65 Sub-therapeutic, hep @ 1100 un/hr  Plan:  Bolus heparin 1200un x 1 dose Increase heparin rate to 1200 units/he Will recheck aPTT 8 hrs after rate change  f/u for correlation of aPTT and Heparin level CBC daily while on heparin therapy  Halsey Persaud Rodriguez-Guzman PharmD, BCPS 06/18/2022 4:39 PM

## 2022-06-18 NOTE — Progress Notes (Signed)
*  PRELIMINARY RESULTS* Echocardiogram 2D Echocardiogram has been performed.  Gary Mccoy 06/18/2022, 3:17 PM

## 2022-06-18 NOTE — Progress Notes (Signed)
[facility]  ROUNDING NOTE   Subjective:   Mr. Blaiz Konrad. was admitted to [facility] on 06/17/2022 for CHF exacerbation [I50.9] Acute CHF (congestive heart failure) [I50.9]  Patient brought by EMS with chest pain and shortness of breath. Now on BIPAP. Patient was placed on NTG gtt and given IV furosemide.   Nephrology consulted for a creatinine greater than 4.   Patient was last seen by nephrology, Dr. Candiss Norse, on 3/21 where he was in his usual state of health.    Objective:  Vital signs in last 24 hours:  Temp:  [98.2 F (36.8 C)-98.6 F (37 C)] 98.2 F (36.8 C) (04/02 1100) Pulse Rate:  [30-158] 74 (04/02 1115) Resp:  [15-43] 19 (04/02 1115) BP: (124-220)/(67-179) 154/77 (04/02 1115) SpO2:  [67 %-100 %] 98 % (04/02 1115) FiO2 (%):  [30 %-50 %] 30 % (04/02 0853) Weight:  [86.6 kg] 86.6 kg (04/01 1941)  Weight change:  Filed Weights   06/17/22 1941  Weight: 86.6 kg    Intake/Output: I/O last 3 completed shifts: In: 524.1 [I.V.:524.1] Out: -    Intake/Output this shift:  No intake/output data recorded.  Physical Exam: General: Critically ill  Head: BIPAP  Eyes: Anicteric, PERRL  Neck: Supple, trachea midline  Lungs:  Bilateral diffuse crackles.   Heart: Regular rate and rhythm  Abdomen:  Soft, nontender,   Extremities:  trace peripheral edema.  Neurologic: Nonfocal, moving all four extremities  Skin: No lesions        Basic Metabolic Panel: Recent Labs  Lab 06/17/22 1724 06/18/22 0534  NA 137 137  K 3.8 3.5  CL 103 103  CO2 19* 20*  GLUCOSE 161* 111*  BUN 60* 62*  CREATININE 4.04* 3.53*  CALCIUM 8.9 8.4*    Liver Function Tests: No results for input(s): "AST", "ALT", "ALKPHOS", "BILITOT", "PROT", "ALBUMIN" in the last 168 hours. No results for input(s): "LIPASE", "AMYLASE" in the last 168 hours. No results for input(s): "AMMONIA" in the last 168 hours.  CBC: Recent Labs  Lab 06/17/22 1724 06/18/22 0534  WBC 12.6* 6.9  HGB  12.1* 9.4*  HCT 37.0* 29.0*  MCV 85.3 83.8  PLT 237 172    Cardiac Enzymes: No results for input(s): "CKTOTAL", "CKMB", "CKMBINDEX", "TROPONINI" in the last 168 hours.  BNP: Invalid input(s): "POCBNP"  CBG: No results for input(s): "GLUCAP" in the last 168 hours.  Microbiology: Results for orders placed or performed during the hospital encounter of 10/29/21  SARS Coronavirus 2 by RT PCR (hospital order, performed in [facility] hospital lab) *cepheid single result test* Anterior Nasal Swab     Status: None   Collection Time: 10/29/21  2:59 PM   Specimen: Anterior Nasal Swab  Result Value Ref Range Status   SARS Coronavirus 2 by RT PCR NEGATIVE NEGATIVE Final    Comment: (NOTE) SARS-CoV-2 target nucleic acids are NOT DETECTED.  The SARS-CoV-2 RNA is generally detectable in upper and lower respiratory specimens during the acute phase of infection. The lowest concentration of SARS-CoV-2 viral copies this assay can detect is 250 copies / mL. A negative result does not preclude SARS-CoV-2 infection and should not be used as the sole basis for treatment or other patient management decisions.  A negative result may occur with improper specimen collection / handling, submission of specimen other than nasopharyngeal swab, presence of viral mutation(s) within the areas targeted by this assay, and inadequate number of viral copies (<250 copies / mL). A negative result must be  combined with clinical observations, patient history, and epidemiological information.  Fact Sheet for Patients:   https://www.patel.info/  Fact Sheet for Healthcare Providers: https://hall.com/  This test is not yet approved or  cleared by the [place] FDA and has been authorized for detection and/or diagnosis of SARS-CoV-2 by FDA under an Emergency Use Authorization (EUA).  This EUA will remain in effect (meaning this test can be used) for the duration of  the COVID-19 declaration under Section 564(b)(1) of the Act, 21 U.S.C. section 360bbb-3(b)(1), unless the authorization is terminated or revoked sooner.  Performed at [facility], [address] [place], [place] [address]     Coagulation Studies: No results for input(s): "LABPROT", "INR" in the last 72 hours.  Urinalysis: No results for input(s): "COLORURINE", "LABSPEC", "PHURINE", "GLUCOSEU", "HGBUR", "BILIRUBINUR", "KETONESUR", "PROTEINUR", "UROBILINOGEN", "NITRITE", "LEUKOCYTESUR" in the last 72 hours.  Invalid input(s): "APPERANCEUR"    Imaging: DG Chest Port 1 View  Result Date: 06/17/2022 CLINICAL DATA:  Shortness of breath EXAM: PORTABLE CHEST 1 VIEW COMPARISON:  10/29/2021 FINDINGS: Transverse diameter of heart is within normal limits. Central pulmonary vessels are more prominent. Increased interstitial markings are seen in both lungs, more so in right lower lung field. There is blunting of right lateral CP angle. There is no pneumothorax. IMPRESSION: Central pulmonary vessels are more prominent suggesting CHF. Increased interstitial markings are seen in both lungs, more so in the right lower lung field. This may suggest pulmonary edema or multifocal pneumonia. Small right pleural effusion. Electronically Signed   By: Elmer Picker M.D.   On: 06/17/2022 17:48     Medications:    heparin 1,100 Units/hr (06/18/22 1220)   nitroGLYCERIN 95 mcg/min (06/18/22 0950)    clopidogrel  75 mg Oral Daily   fluticasone  1 spray Each Nare Daily   furosemide  40 mg Intravenous BID   hydrALAZINE  100 mg Oral TID   isosorbide dinitrate  30 mg Oral TID   metoprolol succinate  25 mg Oral Daily   rosuvastatin  20 mg Oral Daily   acetaminophen, albuterol, nitroGLYCERIN, ondansetron (ZOFRAN) IV, sodium chloride  Assessment/ Plan:  Mr. Ryun Rodenbaugh. is a 72 y.o. white male with congestive heart failure, coronary artery disease, gastric ulcer disease, peripheral  vascular disease, stroke, COPD who is admitted to [facility] on 06/17/2022 for CHF exacerbation [I50.9] Acute CHF (congestive heart failure) [I50.9]  Acute Kidney Injury on chronic kidney disease on chronic kidney disease stage IV: baseline creatinine of 2.41, GFR of 28. 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 Acute kidney injury secondary to acute cardiorenal syndrome. No acute indication for dialysis. History of intolerance to olmesartan. Not currently on an SGLT-2 inhibitor.   Acute exacerbation of chronic systolic and diastolic congestive heart failure. Last echocardiogram in 2023. Pending new echo. Home regimen of furosemide 40mg  daily and metolazone 5mg  twice a week.  - Furosemide 40mg  IV   Anemia with chronic kidney disease: hemoglobin dropped from 12.1 to 9.4. Continue to monitor.   Hypertension with chronic kidney disease: placed on NTG gtt. Restarted metoprolol, isosorbide mononitrate and hydralazine.   Secondary Hyperparathyroidism: labs on 05/28/22: PTH 167, phosphorus and calcium at goal. Not on a vitamin D agent.    LOS: 1 Salma Walrond 4/2/202412:38 PM

## 2022-06-18 NOTE — ED Notes (Signed)
Assumed care from [facility], [place]. Pt resting comfortably in bed at this time. Pt denies any current needs or questions. Call light with in reach. Pt on bipap

## 2022-06-18 NOTE — Progress Notes (Signed)
[facility] [place] NAME: Gary Mccoy    MR#:  [id number]  Calvin:  1950/07/02  SUBJECTIVE:  patient seen in the emergency room. During my evaluation was off BiPAP. Sats were more than 92% on 3 L nasal cannula oxygen. No family at bedside. Patient eating lunch. Overall feeling a bit better. Currently on Nitro drip hundred micrograms per minute. On heparin drip as well no chest pain.   VITALS:  Blood pressure 139/63, pulse 88, temperature 98.2 F (36.8 C), resp. rate (!) 23, height 6\' 1"  (1.854 m), weight 86.6 kg, SpO2 97 %.  PHYSICAL EXAMINATION:   GENERAL:  72 y.o.-year-old patient with no acute distress. Appears chronically ill LUNGS: Normal breath sounds bilaterally, no wheezing CARDIOVASCULAR: S1, S2 normal. No murmur   ABDOMEN: Soft, nontender, nondistended. Bowel sounds present.  EXTREMITIES: No  edema b/l.    NEUROLOGIC: nonfocal  patient is alert and awake SKIN: No obvious rash, lesion, or ulcer.   LABORATORY PANEL:  CBC Recent Labs  Lab 06/18/22 0534  WBC 6.9  HGB 9.4*  HCT 29.0*  PLT 172    Chemistries  Recent Labs  Lab 06/18/22 0534  NA 137  K 3.5  CL 103  CO2 20*  GLUCOSE 111*  BUN 62*  CREATININE 3.53*  CALCIUM 8.4*   Cardiac Enzymes No results for input(s): "TROPONINI" in the last 168 hours. RADIOLOGY:  DG Chest Port 1 View  Result Date: 06/17/2022 CLINICAL DATA:  Shortness of breath EXAM: PORTABLE CHEST 1 VIEW COMPARISON:  10/29/2021 FINDINGS: Transverse diameter of heart is within normal limits. Central pulmonary vessels are more prominent. Increased interstitial markings are seen in both lungs, more so in right lower lung field. There is blunting of right lateral CP angle. There is no pneumothorax. IMPRESSION: Central pulmonary vessels are more prominent suggesting CHF. Increased interstitial markings are seen in both lungs, more so in the right lower lung field. This may suggest pulmonary  edema or multifocal pneumonia. Small right pleural effusion. Electronically Signed   By: Elmer Picker M.D.   On: 06/17/2022 17:48    Assessment and Plan Gary Mccoy. is a 72 y.o. male with medical history significant for CAD s/p stent angioplasty, prior stroke with bilateral carotid artery stenosis,, PAD status post aortobilateral femoral artery bypass in 2017, on Eliquis,, pulmonary hypertension, systolic CHF with improved EF (55 to 60% 10/2021), secondary to ischemic ischemic cardiomyopathy, hypertension, CKD stage IV,   who presents to the ED by EMS with shortness of breath and chest pressure that started about 40 minutes prior to arrival.  He took 2 sublingual nitroglycerin without improvement.  On arrival of EMS, patient appeared pale diaphoretic and had labored breathing with O2 sat 67% and was placed on NRB for transport.  On arrival to the ED he was transitioned to BiPAP.   Acute on chronic systolic CHF (congestive heart failure) Ischemic cardiomyopathy Acute respiratory failure with hypoxia Hypertensive emergency Patient presents with shortness of breath, chest pain, SBP over 200, O2 sat 67% currently on BiPAP History of systolic CHF with recovered EF of 55 to 60% 10/2021 -Continue IV Lasix, nitroglycerin infusion -Continue metoprolol,, hydralazine -Daily weights with intake and output monitoring -Echocardiogram -Cardiology consult with Dr Josefa Half- Start Isordil 30 mg 3 times daily once nitroglycerin infusion stopped -Continue hydralazine 100 mg 3 times daily -Continue diuresis with IV Lasix 40 mg twice daily (on 20 mg p.o. Lasix twice daily and as needed  metolazone at home -Increase metoprolol XL from 25 mg to 50 mg daily -Consider restarting amlodipine if BP remains elevated despite the above medications   Unstable angina CAD s/p PCI History of cardiac cath 03/2021 with stent placement Patient presents with chest pain in the setting of CHF exacerbation, ST depression  inferolateral leads, troponin elevation to 168, flat from prior done a few hours earlier at PCPs office of 178 - Continue heparin infusion - Continue nitroglycerin infusion to titrate to pain as well as blood pressure - Continue metoprolol, rosuvastatin - Echocardiogram to evaluate for segmental wall motion abnormality   PAD s/p aortobifemoral bypass 2017 (peripheral artery disease) History of CVA and bilateral carotid artery stenosis Chronic anticoagulation Holding Eliquis as patient on IV heparin Continue rosuvastatin   Acute renal failure superimposed on stage 4 chronic kidney disease Renal function slightly lower than baseline, creatinine for baseline 3.5 Avoid nephrotoxins Monitor renal function given IV Lasix for treatment of CHF exacerbation Seen by Dr Juleen China       Procedures: Family communication :none today Consults : cardiology, nephrology CODE STATUS: full DVT Prophylaxis : Level of care: Stepdown Status is: Inpatient Remains inpatient appropriate because:remains on nitrogen heparin drip    TOTAL TIME TAKING CARE OF THIS PATIENT: 40 minutes.  >50% time spent on counselling and coordination of care  Note: This dictation was prepared with Dragon dictation along with smaller phrase technology. Any transcriptional errors that result from this process are unintentional.  Fritzi Mandes M.D    [facility]   CC: Primary care physician; Rusty Aus, MD

## 2022-06-18 NOTE — ED Notes (Signed)
Pt states the only way he will allow [place] to remove the bipap is if he has some type of nasal spray due to how dry his nose is. MD Posey Pronto notified and gave verbal order for saline nasal spray. Order placed at this time.

## 2022-06-18 NOTE — Progress Notes (Signed)
Patient trialed off of Bipap and placed on 5L . Patient tolerated well for approximately 5 minutes before becoming labored while trying to use the bedside urinal. Patient placed back on Bipap at this time.

## 2022-06-18 NOTE — Progress Notes (Signed)
RT to patient bedside due to patient stating he "needs more air". Patient reassured his settings are more than adequate, pulling tidal volumes of 1000-1326mL, with 100% SAT. Patient appears to be anxious, with respirations in the 30s but denies anxiety at this time. Breathing treatment given in line with respirations improving into low 20s. RN made aware.

## 2022-06-18 NOTE — Consult Note (Signed)
[place] CARDIOLOGY CONSULT NOTE       Patient ID: Gary Mccoy. MRN: [id number] DOB/AGE: [birth year] 72 y.o.  Admit date: 06/17/2022 Referring Physician Dr. Judd Gaudier Primary Physician Dr. Emily Filbert Primary Cardiologist Dr. Clayborn Bigness Reason for Consultation AoCHF  HPI: Gary Mccoy. Is a 72yoM with a PMH of 1vCAD s/p complex PCI with DES to prox-mid RCA (04/16/21, [facility]), mod pulmonary HTN, HFpEF (55-60%, mild MR 10/2021), CKD 4, atrophic R kidney & R renal artery stenosis, PAD s/p aorto-bifem bypass (2017), moderate bilat carotid artery stenosis, hx CVA who presented to [facility] ED 06/17/22 with progressive shortness of breath for several days, that acutely worsened yesterday morning.  The patient had outpatient labs that his PCP that showed significantly worsening creatinine for which she was referred to the emergency department.  Cardiology is consulted for assistance with his heart failure.  The patient is resting comfortably on BiPAP with his wife present at bedside.  He awakens easily to voice and provides the entirety history while on BiPAP.  He states that he started having nasal congestion, wet cough and shortness of breath intermittently over the past week initially attributing this to "sinusitis." He also gained 3 to 4 pounds over the same time frame with diminished urine output despite taking his usual 20 mg of Lasix twice daily.  Occasionally when he notices weight gain or swelling in his legs he will take an extra 1-2 Lasix pills, but noted that he did not do this over the past week.  He woke up yesterday acutely short of breath really only feels relief from his dyspnea with sitting up or leaning forward on his knees.  He has tried his albuterol inhaler without relief of his symptoms.  Just before presentation to the emergency department, he states he had significant chest tightness and sweating but his primary symptom was "the inability to get of breath and at all."   His chest tightness resolved after he was placed on BiPAP in the emergency department, also started on nitroglycerin and heparin infusions and given a dose of IV Lasix with at least 600 mL of urine output.  He does admit to some increased peripheral edema but denies recent palpitations reports compliance with his home Eliquis and clopidogrel in addition to his other medications.  At my time evaluation patient is sitting upright in bed on BiPAP, speaking in complete sentences.  He says his breathing feels much better chest discomfort or any other active complaints.  Recent vitals notable for a blood pressure of 150/82, heart rate in the 70s in sinus rhythm on telemetry.  O2 sat 100% on BiPAP.  Labs notable for improving renal function with creatinine downtrending from 4.04-3.53 today, GFR currently 18.  Potassium 3.5.  BNP downtrending from [phone].  High-sensitivity troponin elevated with a flat trend at 178, 168, 179 these 12.6-6.9, H&H with significant drop overnight from 12.1/37-5.4/28.  Platelets 172,000.  Chest x-ray with prominent central pulmonary vessels C/W pulmonary edema with patient.  Review of systems complete and found to be negative unless listed above     Past Medical History:  Diagnosis Date   Arthritis    lower back   Back pain    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    CHF (congestive heart failure)    Chronic kidney disease    Complication of anesthesia    Violent with hallucinations after procedure 10/2015   Coronary artery disease    DDD (degenerative disc disease), lumbar    Duodenal  ulcer    Elevated lipids    GERD (gastroesophageal reflux disease)    Leg weakness, bilateral    s/p lumbar surgery   PAD (peripheral artery disease)    Polyradiculitis    Stroke    Rt lower leg  estimated 10 - 12 stokes in last 5 years   Wears dentures    full upper and lower    Past Surgical History:  Procedure Laterality Date   AORTA - BILATERAL FEMORAL ARTERY BYPASS GRAFT N/A 11/08/2015    Procedure: AORTA BIFEMORAL BYPASS GRAFT;  Surgeon: Katha Cabal, MD;  Location: [facility];  Service: Vascular;  Laterality: N/A;   BACK SURGERY  2005   CATARACT EXTRACTION W/PHACO Right 02/20/2022   Procedure: CATARACT EXTRACTION PHACO AND INTRAOCULAR LENS PLACEMENT ([place]) RIGHT;  Surgeon: Leandrew Koyanagi, MD;  Location: [place];  Service: Ophthalmology;  Laterality: Right;  5.77 0:52.3   CATARACT EXTRACTION W/PHACO Left 03/06/2022   Procedure: CATARACT EXTRACTION PHACO AND INTRAOCULAR LENS PLACEMENT ([place]) LEFT;  Surgeon: Leandrew Koyanagi, MD;  Location: [place];  Service: Ophthalmology;  Laterality: Left;  5.77 1:07.5   CERVICAL DISC SURGERY  1987   COLONOSCOPY WITH PROPOFOL N/A 12/30/2017   Procedure: COLONOSCOPY WITH PROPOFOL;  Surgeon: Toledo, Benay Pike, MD;  Location: [facility];  Service: Gastroenterology;  Laterality: N/A;   COLONOSCOPY WITH PROPOFOL N/A 02/02/2021   Procedure: COLONOSCOPY WITH PROPOFOL;  Surgeon: Lesly Rubenstein, MD;  Location: [facility];  Service: Endoscopy;  Laterality: N/A;  [place]   ENDARTERECTOMY FEMORAL Bilateral 11/08/2015   Procedure: ENDARTERECTOMY FEMORAL;  Surgeon: Katha Cabal, MD;  Location: [facility];  Service: Vascular;  Laterality: Bilateral;  common, SFA, and profundis  Aortic endarterectomy    ESOPHAGOGASTRODUODENOSCOPY (EGD) WITH PROPOFOL N/A 09/04/2015   Procedure: ESOPHAGOGASTRODUODENOSCOPY (EGD) WITH PROPOFOL;  Surgeon: Lucilla Lame, MD;  Location: [place];  Service: Endoscopy;  Laterality: N/A;   ESOPHAGOGASTRODUODENOSCOPY (EGD) WITH PROPOFOL N/A 02/02/2021   Procedure: ESOPHAGOGASTRODUODENOSCOPY (EGD) WITH PROPOFOL;  Surgeon: Lesly Rubenstein, MD;  Location: [facility];  Service: Endoscopy;  Laterality: N/A;  [place]   LUMBAR LAMINECTOMY  2006   RIGHT/LEFT HEART CATH AND CORONARY ANGIOGRAPHY Bilateral 03/28/2021   Procedure: RIGHT/LEFT HEART CATH AND CORONARY ANGIOGRAPHY;   Surgeon: Yolonda Kida, MD;  Location: [place] [facility];  Service: Cardiovascular;  Laterality: Bilateral;   SEPTOPLASTY N/A 09/28/2020   Procedure: SEPTOPLASTY;  Surgeon: Margaretha Sheffield, MD;  Location: [place];  Service: ENT;  Laterality: N/A;   TEE WITHOUT CARDIOVERSION N/A 09/22/2015   Procedure: TRANSESOPHAGEAL ECHOCARDIOGRAM (TEE);  Surgeon: Teodoro Spray, MD;  Location: [facility];  Service: Cardiovascular;  Laterality: N/A;   TURBINATE REDUCTION Bilateral 09/28/2020   Procedure: TURBINATE REDUCTION;  Surgeon: Margaretha Sheffield, MD;  Location: [place];  Service: ENT;  Laterality: Bilateral;    (Not in a hospital admission)  Social History   Socioeconomic History   Marital status: Married    Spouse name: Not on file   Number of children: Not on file   Years of education: Not on file   Highest education level: Not on file  Occupational History   Not on file  Tobacco Use   Smoking status: Every Day    Packs/day: 1.00    Years: 45.00    Additional pack years: 0.00    Total pack years: 45.00    Types: Cigarettes   Smokeless tobacco: Never   Tobacco comments:    05/17/21- currently 0.5 PPD  Vaping Use   Vaping Use: Never used  Substance and Sexual Activity   Alcohol use: Yes    Comment: rare 3 a year   Drug use: No   Sexual activity: Not on file  Other Topics Concern   Not on file  Social History Narrative   Not on file   Social Determinants of Health   Financial Resource Strain: Not on file  Food Insecurity: No Food Insecurity (10/19/2021)   Hunger Vital Sign    Worried About Running Out of Food in the Last Year: Never true    Ran Out of Food in the Last Year: Never true  Transportation Needs: No Transportation Needs (10/19/2021)   PRAPARE - Hydrologist (Medical): No    Lack of Transportation (Non-Medical): No  Physical Activity: Not on file  Stress: Not on file  Social Connections: Not on file  Intimate Partner  Violence: Not on file    Family History  Problem Relation Age of Onset   Heart attack Mother    Hypertension Mother    Varicose Veins Mother    Diabetes Father    Heart attack Father    Heart attack Maternal Grandmother    Stroke Maternal Grandmother       Intake/Output Summary (Last 24 hours) at 06/18/2022 [id number] Last data filed at 06/18/2022 0514 Gross per 24 hour  Intake 524.11 ml  Output --  Net 524.11 ml    Vitals:   06/18/22 0417 06/18/22 0545 06/18/22 0600 06/18/22 0700  BP:  (!) 157/79 (!) 153/79 (!) 150/82  Pulse:  78  81  Resp:  (!) 31 16 17   Temp: 98.6 F (37 C)     TempSrc: Oral     SpO2:  98%  98%  Weight:      Height:        PHYSICAL EXAM General: Elderly Caucasian male, well nourished, in no acute distress.  Sitting upright in the stretcher on BiPAP, wife at bedside. HEENT:  Normocephalic and atraumatic. Neck:  No JVD.  Lungs: Speaks in complete sentences on BiPAP.  Trace expiratory wheezes anteriorly with decreased breath sounds to auscultation without appreciable crackles. Heart: HRRR . Normal S1 and S2 without gallops or murmurs.  Abdomen: Non-distended appearing with excess adiposity.  Msk: Normal strength and tone for age. Extremities: Warm to touch. No clubbing, cyanosis.  2+ left and 1+ right lower extremity edema.  Neuro: Alert and oriented X 3. Psych:  Answers questions appropriately.   Labs: Basic Metabolic Panel: Recent Labs    06/17/22 1724 06/18/22 0534  NA 137 137  K 3.8 3.5  CL 103 103  CO2 19* 20*  GLUCOSE 161* 111*  BUN 60* 62*  CREATININE 4.04* 3.53*  CALCIUM 8.9 8.4*   Liver Function Tests: No results for input(s): "AST", "ALT", "ALKPHOS", "BILITOT", "PROT", "ALBUMIN" in the last 72 hours. No results for input(s): "LIPASE", "AMYLASE" in the last 72 hours. CBC: Recent Labs    06/17/22 1724 06/18/22 0534  WBC 12.6* 6.9  HGB 12.1* 9.4*  HCT 37.0* 29.0*  MCV 85.3 83.8  PLT 237 172   Cardiac Enzymes: Recent Labs     06/17/22 1457 06/17/22 1724 06/17/22 1944  TROPONINIHS 178* 168* 179*   BNP: Recent Labs    06/17/22 1944  BNP 1,440.5*   D-Dimer: No results for input(s): "DDIMER" in the last 72 hours. Hemoglobin A1C: No results for input(s): "HGBA1C" in the last 72 hours. Fasting Lipid Panel: No  results for input(s): "CHOL", "HDL", "LDLCALC", "TRIG", "CHOLHDL", "LDLDIRECT" in the last 72 hours. Thyroid Function Tests: No results for input(s): "TSH", "T4TOTAL", "T3FREE", "THYROIDAB" in the last 72 hours.  Invalid input(s): "FREET3" Anemia Panel: No results for input(s): "VITAMINB12", "FOLATE", "FERRITIN", "TIBC", "IRON", "RETICCTPCT" in the last 72 hours.   Radiology: [facility] Chest Port 1 View  Result Date: 06/17/2022 CLINICAL DATA:  Shortness of breath EXAM: PORTABLE CHEST 1 VIEW COMPARISON:  10/29/2021 FINDINGS: Transverse diameter of heart is within normal limits. Central pulmonary vessels are more prominent. Increased interstitial markings are seen in both lungs, more so in right lower lung field. There is blunting of right lateral CP angle. There is no pneumothorax. IMPRESSION: Central pulmonary vessels are more prominent suggesting CHF. Increased interstitial markings are seen in both lungs, more so in the right lower lung field. This may suggest pulmonary edema or multifocal pneumonia. Small right pleural effusion. Electronically Signed   By: Elmer Picker M.D.   On: 06/17/2022 17:48    04/16/21 Single vessel coronary artery disease.  Successful PCI of prox-mid RCA  IVUS used for optimization   3.0 mm PTCA balloon used to pre-dilate  3.0 x 48-mm Synergy DES  3.5 mm Natalia balloon distally  4.0 mm Willards balloon in mid RCA  4.5 mm Garfield ballloon in prox RCA   Loss of atrial side branch but excellent overall result   Recommendations:   Risk factor modification and secondary prevention measures  Aggressive medical therapy for CAD  Routine post-PCI care; sheath out, TR band applied  Refer for  cardiac rehab in 2-3 weeks  Aspirin 81 mg x 7 days; P2Y12 inhibitor for at least 6 months; Avoid elective surgery while receiving a P2Y12 inhibitor  Resume Xarelto tomorrow  Same day PCI discharge  Follow up with Dr. Clayborn Bigness in [facility]   [facility] 03/29/11   Mid LM to Dist LM lesion is 50% stenosed.   Ost RCA lesion is 70% stenosed.   Prox RCA-1 lesion is 80% stenosed.   Prox RCA-2 lesion is 80% stenosed.   Dist LAD-2 lesion is 50% stenosed.   Dist LAD-1 lesion is 50% stenosed.   1st Diag lesion is 70% stenosed.   Ost Cx lesion is 50% stenosed.   The left ventricular systolic function is normal.   LV end diastolic pressure is normal.   The left ventricular ejection fraction is 55-65% by visual estimate.   Hemodynamic findings consistent with moderate pulmonary hypertension.   Right heart cath right brachial Wedge 29/36 mean of 24 PA 53/24 mean of 38 RV [id number] end-diastolic of 16 RA [id number] mean of 14 LV 0000000 end-diastolic of 28 Ao [id number] Ao sat 96% PA sat 70% Cardiac output Fick 6.38 Index 3.13 Measurement suggests moderate pulmonary hypertension   Left heart cath right ulnar Normal left ventricular function EF of around 60% Coronaries Left main large minor disease ostially 40-50% distal at bifurcation LAD diffuse 50% LAD lesion proximal mid and distal Diagonal 1 small to medium 70% proximal Circumflex medium to large size with minor irregularities RCA large 50% proximal 80% proximal 80% proximal serial lesions   Patient tolerated procedure well TR band applied   Plan Refer the patient to pulmonary for moderate pulm hypertension Consider referral to tertiary care center for evaluation of distal left main as well as RCA and ostial circumflex for possible intervention  vs coronary bypass surgery  ECHO 10/30/2021 1. Left ventricular ejection fraction, by estimation, is 55 to 60%. The  left ventricle has  normal function. The left ventricle has no regional  wall motion  abnormalities. Left ventricular diastolic parameters were  normal.   2. Right ventricular systolic function is normal. The right ventricular  size is normal.   3. The mitral valve is normal in structure. Mild mitral valve  regurgitation. No evidence of mitral stenosis.   4. The aortic valve is normal in structure. Aortic valve regurgitation is  not visualized. No aortic stenosis is present.   5. The inferior vena cava is normal in size with greater than 50%  respiratory variability, suggesting right atrial pressure of 3 mmHg.   TELEMETRY reviewed by me (LT) 06/18/2022 : Sinus rhythm first-degree AV block rate 80s-90s initially.  The ventricular trigeminy, now normal sinus rhythm prescribed a block rate in the 70s  EKG reviewed by me: NSR 1* AV block inferolateral ST depressions, improved on repeat  Data reviewed by me (LT) 06/18/2022: Last outpatient PCP note, nephrology note, cardiology note, ED note, Admission H&P last 24h vitals tele labs imaging I/O   Principal Problem:   Acute on chronic systolic CHF (congestive heart failure) Active Problems:   PAD s/p aortobifemoral bypass 2017 (peripheral artery disease)   Acute respiratory failure with hypoxia   Acute renal failure superimposed on stage 4 chronic kidney disease   History of CVA and bilateral carotid artery stenosis (cerebrovascular accident)   Chronic anticoagulation   CAD S/P percutaneous coronary angioplasty   Ischemic cardiomyopathy   Unstable angina   Hypertensive emergency   Acute CHF (congestive heart failure)    ASSESSMENT AND PLAN:  Gary Mccoy. Is a 88yoM with a PMH of 1vCAD s/p complex PCI with DES to prox-mid RCA (04/16/21, [facility]), mod pulmonary HTN, HFpEF (55-60%, mild MR 10/2021), CKD 4, atrophic R kidney & R renal artery stenosis, PAD s/p aorto-bifem bypass (2017), moderate bilat carotid artery stenosis, hx CVA who presented to [facility] ED 06/17/22 with progressive shortness of breath for several days, that acutely  worsened yesterday morning.  The patient had outpatient labs that his PCP that showed significantly worsening creatinine for which she was referred to the emergency department.  Cardiology is consulted for assistance with his heart failure.  #?  URI # Hypertensive urgency # Acute on chronic HFpEF Symptomatic with several days of shortness of breath and orthopnea and upper respiratory congestion noticed 3-4 pound weight gain.  Presents with hypertensive urgency with BP 200/98 and flash pulmonary edema, hypervolemic appearing clinically with 1-2+ ankle edema and vascular congestion on chest x-ray. BNP elevated at 2155.  Initially requiring BiPAP overnight, clinical improvement after nitroglycerin infusions and IV Lasix. -Wean oxygen as tolerated -Continue to wean nitroglycerin drip entirely once off BiPAP -Start Isordil 30 mg 3 times daily once nitroglycerin infusion stopped -Continue hydralazine 100 mg 3 times daily -Continue diuresis with IV Lasix 40 mg twice daily (on 20 mg p.o. Lasix twice daily and as needed metolazone at home -Increase metoprolol XL from 25 mg to 50 mg daily -Consider restarting amlodipine if BP remains elevated despite the above medications -Echo complete -Salt and fluid restriction, daily weights  # AKI on CKD 4 The patient noticed diminished urine output in the days leading up to his presentation despite his normal Lasix 20 mg twice daily.  Baseline creatinine anywhere from 2.1-2.9, presents with BUN/creatinine 60/4.04 and GFR 15, fortunately improving after initial diuresis to BUN/creatinine 62/3 0.53 and GFR 18 today  # Demand ischemia # CAD s/p complex PCI x 1 to mid RCA (03/2021) without  chest pain Reported chest tightness primarily associated with respirations that improved immediately after he was placed on BiPAP.  EKG with slight inferolateral ST depressions, possibly rate related, that improved on repeat EKG.  Troponins elevated with a flat trend at 178, 168, 179,  and he has had no further chest discomfort noted at my time of evaluation.  This troponin elevation is most consistent with demand/supply mismatch and not ACS, in the setting of hypertensive urgency and flash pulmonary edema and known CKD 4. -Continue heparin infusion as the patient takes Eliquis below for PAD -S/p aspirin 325 mg x 1, continue clopidogrel 75 mg daily -Continue rosuvastatin 20 mg daily -Repeat echo as above -No plan for additional invasive cardiac diagnostics at this time  # PAD s/p aortobifemoral bypass (2017) Home medications include clopidogrel 75 mg daily and Eliquis 2.5 mg twice daily  This patient's plan of care was discussed and created with Dr. Saralyn Pilar and he is in agreement.  Signed: Tristan Schroeder , PA-C 06/18/2022, 8:07 AM [facility]

## 2022-06-19 ENCOUNTER — Encounter: Payer: Self-pay | Admitting: Internal Medicine

## 2022-06-19 DIAGNOSIS — I5023 Acute on chronic systolic (congestive) heart failure: Secondary | ICD-10-CM | POA: Diagnosis not present

## 2022-06-19 LAB — CBC
HCT: 31 % — ABNORMAL LOW (ref 39.0–52.0)
Hemoglobin: 10.3 g/dL — ABNORMAL LOW (ref 13.0–17.0)
MCH: 27.5 pg (ref 26.0–34.0)
MCHC: 33.2 g/dL (ref 30.0–36.0)
MCV: 82.7 fL (ref 80.0–100.0)
Platelets: 187 10*3/uL (ref 150–400)
RBC: 3.75 MIL/uL — ABNORMAL LOW (ref 4.22–5.81)
RDW: 16.3 % — ABNORMAL HIGH (ref 11.5–15.5)
WBC: 7.5 10*3/uL (ref 4.0–10.5)
nRBC: 0 % (ref 0.0–0.2)

## 2022-06-19 LAB — BASIC METABOLIC PANEL
Anion gap: 9 (ref 5–15)
BUN: 62 mg/dL — ABNORMAL HIGH (ref 8–23)
CO2: 22 mmol/L (ref 22–32)
Calcium: 8.6 mg/dL — ABNORMAL LOW (ref 8.9–10.3)
Chloride: 104 mmol/L (ref 98–111)
Creatinine, Ser: 2.88 mg/dL — ABNORMAL HIGH (ref 0.61–1.24)
GFR, Estimated: 23 mL/min — ABNORMAL LOW (ref >60)
Glucose, Bld: 100 mg/dL — ABNORMAL HIGH (ref 70–99)
Potassium: 3.5 mmol/L (ref 3.5–5.1)
Sodium: 135 mmol/L (ref 135–145)

## 2022-06-19 LAB — LIPOPROTEIN A (LPA): Lipoprotein (a): 41.3 nmol/L — ABNORMAL HIGH (ref <75.0)

## 2022-06-19 LAB — HEPARIN LEVEL (UNFRACTIONATED): Heparin Unfractionated: >1.1 IU/mL — ABNORMAL HIGH (ref 0.30–0.70)

## 2022-06-19 LAB — APTT
aPTT: 86 seconds — ABNORMAL HIGH (ref 24–36)
aPTT: 65 seconds — ABNORMAL HIGH (ref 24–36)

## 2022-06-19 LAB — MRSA NEXT GEN BY PCR, NASAL: MRSA by PCR Next Gen: NOT DETECTED

## 2022-06-19 MED ORDER — POTASSIUM CHLORIDE CRYS ER 20 MEQ PO TBCR
40.0000 meq | EXTENDED_RELEASE_TABLET | Freq: Once | ORAL | Status: AC
  Administered 2022-06-19: 40 meq via ORAL
  Filled 2022-06-19: qty 2

## 2022-06-19 MED ORDER — FUROSEMIDE 10 MG/ML IJ SOLN
40.0000 mg | Freq: Once | INTRAMUSCULAR | Status: AC
  Administered 2022-06-19: 40 mg via INTRAVENOUS
  Filled 2022-06-19: qty 4

## 2022-06-19 MED ORDER — ISOSORBIDE DINITRATE 20 MG PO TABS
40.0000 mg | ORAL_TABLET | Freq: Three times a day (TID) | ORAL | Status: DC
  Administered 2022-06-19 – 2022-06-20 (×4): 40 mg via ORAL
  Filled 2022-06-19 (×7): qty 2

## 2022-06-19 MED ORDER — APIXABAN 2.5 MG PO TABS
2.5000 mg | ORAL_TABLET | Freq: Two times a day (BID) | ORAL | Status: DC
  Administered 2022-06-19 – 2022-06-20 (×3): 2.5 mg via ORAL
  Filled 2022-06-19 (×3): qty 1

## 2022-06-19 MED ORDER — FUROSEMIDE 10 MG/ML IJ SOLN
40.0000 mg | Freq: Two times a day (BID) | INTRAMUSCULAR | Status: DC
  Administered 2022-06-19: 40 mg via INTRAVENOUS
  Filled 2022-06-19: qty 4

## 2022-06-19 MED ORDER — AMLODIPINE BESYLATE 5 MG PO TABS
5.0000 mg | ORAL_TABLET | Freq: Every day | ORAL | Status: DC
  Administered 2022-06-19 – 2022-06-20 (×2): 5 mg via ORAL
  Filled 2022-06-19 (×2): qty 1

## 2022-06-19 NOTE — Progress Notes (Signed)
Pt refused bed alarm but was educated about safety.. Will continue to monitor. 

## 2022-06-19 NOTE — Progress Notes (Signed)
Patient profile completed.  Declined pain. Patient was dyspneic and tachypniec. Bipap in place. Patient tolerating bipap without any issues. Blood pressure remains elevated will continue to titrate drip.

## 2022-06-19 NOTE — Progress Notes (Signed)
Gary Mccoy for Heparin drip  (apixaban PTA) Indication: chest pain/ACS  No Known Allergies  Patient Measurements: Height: 6\' 1"  (185.4 cm) Weight: 86.6 kg (191 lb) IBW/kg (Calculated) : 79.9 Heparin Dosing Weight:  86.6 kg  Vital Signs: Temp: 98.7 F (37.1 C) (04/02 2255) Temp Source: Axillary (04/02 2255) BP: 145/71 (04/03 0230) Pulse Rate: 75 (04/03 0230)  Labs: Recent Labs    06/17/22 1457 06/17/22 1724 06/17/22 1944 06/17/22 1944 06/18/22 0534 06/18/22 1537 06/19/22 0207  HGB  --  12.1*  --   --  9.4*  --   --   HCT  --  37.0*  --   --  29.0*  --   --   PLT  --  237  --   --  172  --   --   APTT  --   --  45*   < > 93* 65* 86*  HEPARINUNFRC  --   --  >1.10*  --   --   --   --   CREATININE  --  4.04*  --   --  3.53*  --   --   TROPONINIHS 178* 168* 179*  --   --   --   --    < > = values in this interval not displayed.    Estimated Creatinine Clearance: 22 mL/min (A) (by C-G formula based on SCr of 3.53 mg/dL (H)).  Medical History: Past Medical History:  Diagnosis Date   Arthritis    lower back   Back pain    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    CHF (congestive heart failure)    Chronic kidney disease    Complication of anesthesia    Violent with hallucinations after procedure 10/2015   Coronary artery disease    DDD (degenerative disc disease), lumbar    Duodenal ulcer    Elevated lipids    GERD (gastroesophageal reflux disease)    Leg weakness, bilateral    s/p lumbar surgery   PAD (peripheral artery disease)    Polyradiculitis    Stroke    Rt lower leg  estimated 10 - 12 stokes in last 5 years   Wears dentures    full upper and lower    Medications:  Scheduled:   Chlorhexidine Gluconate Cloth  6 each Topical Daily   clopidogrel  75 mg Oral Daily   fluticasone  1 spray Each Nare Daily   furosemide  40 mg Intravenous BID   hydrALAZINE  100 mg Oral TID   isosorbide dinitrate  30 mg Oral TID   metoprolol  succinate  25 mg Oral Daily   rosuvastatin  20 mg Oral Daily   Infusions:   heparin 1,200 Units/hr (06/18/22 1729)   nitroGLYCERIN 105 mcg/min (06/19/22 0232)    Assessment: 72 yo male with PHM of CAD s/p stent angioplasty, prior stroke with bilateral carotid artery stenosis, PAD status post aortobilateral femoral artery bypass in 2017, on Eliquis,, pulmonary hypertension, systolic CHF with improved EF (55 to 60% 10/2021), secondary to ischemic ischemic cardiomyopathy, hypertension, CKD stage IV. Admitted with chest pain. Pharmacy consulted to manage heparin infusion for ACS. to start Heparin drip   Last apixaban  dose 06/17/22 at 0900.  Baseline labs: Hgb 12.1  plt 237   Scr 4.04 aPTT & Heparin level ordered  Goal of Therapy:  aPTT 66-102 Heparin level 0.3-0.7 units/ml Monitor platelets by anticoagulation protocol: Yes   Date/time aPTT/HL Comments 04/02 0534 aPTT 93 therapeutic  x 1 0402 1537 aPTT 65 Sub-therapeutic, hep @ 1100 un/hr 0403 0207 aPTT 86 Therapeutic x 1   Plan:  Continue heparin rate to 1200 units/he Will recheck aPTT 8 hrs to confirm f/u for correlation of aPTT and Heparin level CBC daily while on heparin therapy  Renda Rolls, PharmD, [facility] 06/19/2022 2:47 AM

## 2022-06-19 NOTE — Discharge Instructions (Signed)

## 2022-06-19 NOTE — Progress Notes (Addendum)
[facility] [place] NAME: Gary Mccoy    MR#:  [id number]  DATE OF BIRTH:  1950/07/24  SUBJECTIVE:  no family at bedside. Patient overall feels a lot better. Vitals stable. Off nitroglycerin drip. Sats 99% on 2 L. No chest pain. Good urine output.  VITALS:  Blood pressure (!) 140/80, pulse 82, temperature 98.1 F (36.7 C), temperature source Axillary, resp. rate 19, height 6\' 1"  (1.854 m), weight 83.8 kg, SpO2 98 %.  PHYSICAL EXAMINATION:   GENERAL:  72 y.o.-year-old patient with no acute distress. Appears chronically ill LUNGS: Normal breath sounds bilaterally, no wheezing CARDIOVASCULAR: S1, S2 normal. No murmur   ABDOMEN: Soft, nontender, nondistended. Bowel sounds present.  EXTREMITIES: No  edema b/l.    NEUROLOGIC: nonfocal  patient is alert and awake SKIN: No obvious rash, lesion, or ulcer.   LABORATORY PANEL:  CBC Recent Labs  Lab 06/19/22 1213  WBC 7.5  HGB 10.3*  HCT 31.0*  PLT 187     Chemistries  Recent Labs  Lab 06/19/22 1213  NA 135  K 3.5  CL 104  CO2 22  GLUCOSE 100*  BUN 62*  CREATININE 2.88*  CALCIUM 8.6*    Cardiac Enzymes No results for input(s): "TROPONINI" in the last 168 hours. RADIOLOGY:  ECHOCARDIOGRAM COMPLETE  Result Date: 06/18/2022    ECHOCARDIOGRAM REPORT   Patient Name:   Gary Mccoy. Date of Exam: 06/18/2022 Medical Rec #:  [id number]             Height:       73.0 in Accession #:    [id number]            Weight:       191.0 lb Date of Birth:  07-Oct-1950             BSA:          2.110 m Patient Age:    72 years              BP:           140/83 mmHg Patient Gender: M                     HR:           91 bpm. Exam Location:  [facility] Procedure: 2D Echo, Cardiac Doppler, Color Doppler and Intracardiac            Opacification Agent Indications:     CHF  History:         Patient has prior history of Echocardiogram examinations. CHF                  and Cardiomyopathy, CAD and Angina,  Stroke and PAD,                  Signs/Symptoms:Shortness of Breath; Risk Factors:Hypertension,                  Dyslipidemia and Current Smoker. Patient was unable to lie down                  due to Shortness of breath. Study completed while patient was                  sitting up in bed.  Sonographer:     Wenda Low Referring Phys:  [id number] Athena Masse Diagnosing Phys: Isaias Cowman MD  Sonographer Comments: Technically difficult  study due to poor echo windows. Image acquisition challenging due to respiratory motion. IMPRESSIONS  1. Left ventricular ejection fraction, by estimation, is 55 to 60%. The left ventricle has normal function. The left ventricle has no regional wall motion abnormalities. Left ventricular diastolic parameters were normal.  2. Right ventricular systolic function is normal. The right ventricular size is normal. There is normal pulmonary artery systolic pressure.  3. The mitral valve is normal in structure. Mild mitral valve regurgitation. No evidence of mitral stenosis.  4. The aortic valve is normal in structure. Aortic valve regurgitation is not visualized. No aortic stenosis is present.  5. The inferior vena cava is normal in size with greater than 50% respiratory variability, suggesting right atrial pressure of 3 mmHg. FINDINGS  Left Ventricle: Left ventricular ejection fraction, by estimation, is 55 to 60%. The left ventricle has normal function. The left ventricle has no regional wall motion abnormalities. Definity contrast agent was given IV to delineate the left ventricular  endocardial borders. The left ventricular internal cavity size was normal in size. There is no left ventricular hypertrophy. Left ventricular diastolic parameters were normal. Right Ventricle: The right ventricular size is normal. No increase in right ventricular wall thickness. Right ventricular systolic function is normal. There is normal pulmonary artery systolic pressure. The tricuspid  regurgitant velocity is 2.39 m/s, and  with an assumed right atrial pressure of 3 mmHg, the estimated right ventricular systolic pressure is 0000000 mmHg. Left Atrium: Left atrial size was normal in size. Right Atrium: Right atrial size was normal in size. Pericardium: There is no evidence of pericardial effusion. Mitral Valve: The mitral valve is normal in structure. Mild mitral valve regurgitation. No evidence of mitral valve stenosis. MV peak gradient, 4.9 mmHg. The mean mitral valve gradient is 2.0 mmHg. Tricuspid Valve: The tricuspid valve is normal in structure. Tricuspid valve regurgitation is mild . No evidence of tricuspid stenosis. Aortic Valve: The aortic valve is normal in structure. Aortic valve regurgitation is not visualized. No aortic stenosis is present. Aortic valve mean gradient measures 3.0 mmHg. Aortic valve peak gradient measures 6.1 mmHg. Aortic valve area, by VTI measures 2.10 cm. Pulmonic Valve: The pulmonic valve was normal in structure. Pulmonic valve regurgitation is not visualized. No evidence of pulmonic stenosis. Aorta: The aortic root is normal in size and structure. Venous: The inferior vena cava is normal in size with greater than 50% respiratory variability, suggesting right atrial pressure of 3 mmHg. IAS/Shunts: No atrial level shunt detected by color flow Doppler.  LEFT VENTRICLE PLAX 2D LVIDd:         5.40 cm   Diastology LVIDs:         3.50 cm   LV e' medial:    4.90 cm/s LV PW:         1.00 cm   LV E/e' medial:  22.9 LV IVS:        1.00 cm   LV e' lateral:   8.81 cm/s LVOT diam:     1.90 cm   LV E/e' lateral: 12.7 LV SV:         55 LV SV Index:   26 LVOT Area:     2.84 cm  RIGHT VENTRICLE RV Basal diam:  3.60 cm RV Mid diam:    3.40 cm RV S prime:     11.20 cm/s TAPSE (M-mode): 3.2 cm LEFT ATRIUM             Index  RIGHT ATRIUM           Index LA diam:        4.40 cm 2.09 cm/m   RA Area:     20.90 cm LA Vol (A2C):   68.9 ml 32.65 ml/m  RA Volume:   63.00 ml  29.86  ml/m LA Vol (A4C):   47.3 ml 22.42 ml/m LA Biplane Vol: 57.8 ml 27.39 ml/m  AORTIC VALVE                    PULMONIC VALVE AV Area (Vmax):    2.35 cm     PV Vmax:       0.95 m/s AV Area (Vmean):   2.10 cm     PV Peak grad:  3.6 mmHg AV Area (VTI):     2.10 cm AV Vmax:           123.00 cm/s AV Vmean:          84.000 cm/s AV VTI:            0.263 m AV Peak Grad:      6.1 mmHg AV Mean Grad:      3.0 mmHg LVOT Vmax:         102.00 cm/s LVOT Vmean:        62.100 cm/s LVOT VTI:          0.195 m LVOT/AV VTI ratio: 0.74  AORTA Ao Root diam: 3.60 cm MITRAL VALVE                TRICUSPID VALVE MV Area (PHT): 5.42 cm     TR Peak grad:   22.8 mmHg MV Area VTI:   1.86 cm     TR Vmax:        239.00 cm/s MV Peak grad:  4.9 mmHg MV Mean grad:  2.0 mmHg     SHUNTS MV Vmax:       1.11 m/s     Systemic VTI:  0.20 m MV Vmean:      70.1 cm/s    Systemic Diam: 1.90 cm MV Decel Time: 140 msec MV E velocity: 112.00 cm/s MV A velocity: 68.70 cm/s MV E/A ratio:  1.63 Isaias Cowman MD Electronically signed by Isaias Cowman MD Signature Date/Time: 06/18/2022/5:00:52 PM    Final    DG Chest Port 1 View  Result Date: 06/17/2022 CLINICAL DATA:  Shortness of breath EXAM: PORTABLE CHEST 1 VIEW COMPARISON:  10/29/2021 FINDINGS: Transverse diameter of heart is within normal limits. Central pulmonary vessels are more prominent. Increased interstitial markings are seen in both lungs, more so in right lower lung field. There is blunting of right lateral CP angle. There is no pneumothorax. IMPRESSION: Central pulmonary vessels are more prominent suggesting CHF. Increased interstitial markings are seen in both lungs, more so in the right lower lung field. This may suggest pulmonary edema or multifocal pneumonia. Small right pleural effusion. Electronically Signed   By: Elmer Picker M.D.   On: 06/17/2022 17:48    Assessment and Plan Gary A Cammron Allsopp. is a 72 y.o. male with medical history significant for CAD s/p stent  angioplasty, prior stroke with bilateral carotid artery stenosis,, PAD status post aortobilateral femoral artery bypass in 2017, on Eliquis,, pulmonary hypertension, systolic CHF with improved EF (55 to 60% 10/2021), secondary to ischemic ischemic cardiomyopathy, hypertension, CKD stage IV,   who presents to the ED by EMS with shortness of breath and chest pressure that started about  40 minutes prior to arrival.  He took 2 sublingual nitroglycerin without improvement.  On arrival of EMS, patient appeared pale diaphoretic and had labored breathing with O2 sat 67% and was placed on NRB for transport.  On arrival to the ED he was transitioned to BiPAP.   Acute on chronic diastolic CHF (congestive heart failure) Ischemic cardiomyopathy Acute respiratory failure with hypoxia Hypertensive emergency Patient presents with shortness of breath, chest pain, SBP over 200, O2 sat 67% currently on BiPAP History of systolic CHF with recovered EF of 55 to 60% 10/2021 -recieved IV Lasix, nitroglycerin infusion -Continue metoprolol,, hydralazine -Daily weights with intake and output monitoring -Echocardiogram -Cardiology consult with Dr Josefa Half- Start Isordil 30 mg 3 times daily once nitroglycerin infusion stopped -Continue hydralazine 100 mg 3 times daily -Continue diuresis with IV Lasix 40 mg twice daily (on 20 mg p.o. Lasix twice daily and metolazone at home -Increase metoprolol XL from 25 mg to 50 mg daily -Consider restarting amlodipine if BP remains elevated despite the above medications -transfer out of ICU --off nitro and heparin gtt. Will resume eliquis (cards aware), hgb stable   Unstable angina CAD s/p PCI History of cardiac cath 03/2021 with stent placement Patient presents with chest pain in the setting of CHF exacerbation, ST depression inferolateral leads, troponin elevation to 168, flat from prior done a few hours earlier at PCPs office of 178 - Continue heparin infusion--now d/ced - weaned off  nitro gtt - Continue metoprolol, rosuvastatin - Echocardiogram  showed EF 55-60%   PAD s/p aortobifemoral bypass 2017 (peripheral artery disease) History of CVA and bilateral carotid artery stenosis Chronic anticoagulation Resumed eliquis Continue rosuvastatin   Acute renal failure superimposed on stage 4 chronic kidney disease Cardiorenal-syndrome Renal function slightly lower than baseline, creatinine for baseline 3.5 Avoid nephrotoxins Seen by Dr Juleen China --creat down to 2.8       Procedures:none Family communication :none today Consults : cardiology, nephrology CODE STATUS: full DVT Prophylaxis : Level of care: Telemetry Cardiac Status is: Inpatient Remains inpatient appropriate because transfer out of ICU, wean oxygen to off, monitor another day or two with diureses as needed    TOTAL TIME TAKING CARE OF THIS PATIENT: 35 minutes.  >50% time spent on counselling and coordination of care  Note: This dictation was prepared with Dragon dictation along with smaller phrase technology. Any transcriptional errors that result from this process are unintentional.  Fritzi Mandes M.D    [facility]   CC: Primary care physician; Rusty Aus, MD

## 2022-06-19 NOTE — Plan of Care (Signed)

## 2022-06-19 NOTE — Progress Notes (Signed)
[facility] CONSULT NOTE       Patient ID: Kacee A Phil Dopp. MRN: [id number] DOB/AGE: 1950-10-20 72 y.o.  Admit date: 06/17/2022 Referring Physician Dr. Judd Gaudier Primary Physician Dr. Emily Filbert Primary Cardiologist Dr. Clayborn Bigness Reason for Consultation AoCHF  HPI: Erland A. Phil Dopp. Is a 72yoM with a PMH of 1vCAD s/p complex PCI with DES to prox-mid RCA (04/16/21, [facility]), mod pulmonary HTN, HFpEF (55-60%, mild MR 10/2021), CKD 4, atrophic R kidney & R renal artery stenosis, PAD s/p aorto-bifem bypass (2017), moderate bilat carotid artery stenosis, hx CVA who presented to [facility] ED 06/17/22 with progressive shortness of breath for several days, that acutely worsened yesterday morning.  The patient had outpatient labs that his PCP that showed significantly worsening creatinine for which she was referred to the emergency department.  Cardiology is consulted for assistance with his heart failure.  Interval History:  - reported some shortness of breath yesterday evening around 7 PM and wanted BiPAP replaced. -Seen and examined this morning in stepdown he denies chest pain or shortness of breath, main complaint is wanting to get out of bed and take a shower.  No heart racing or palpitations, peripheral edema improving.  Review of systems complete and found to be negative unless listed above     Past Medical History:  Diagnosis Date   Arthritis    lower back   Back pain    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    CHF (congestive heart failure)    Chronic kidney disease    Complication of anesthesia    Violent with hallucinations after procedure 10/2015   Coronary artery disease    DDD (degenerative disc disease), lumbar    Duodenal ulcer    Elevated lipids    GERD (gastroesophageal reflux disease)    Leg weakness, bilateral    s/p lumbar surgery   PAD (peripheral artery disease)    Polyradiculitis    Stroke    Rt lower leg  estimated 10 - 12 stokes in last 5 years   Wears  dentures    full upper and lower    Past Surgical History:  Procedure Laterality Date   AORTA - BILATERAL FEMORAL ARTERY BYPASS GRAFT N/A 11/08/2015   Procedure: AORTA BIFEMORAL BYPASS GRAFT;  Surgeon: Katha Cabal, MD;  Location: [facility] ORS;  Service: Vascular;  Laterality: N/A;   BACK SURGERY  2005   CATARACT EXTRACTION W/PHACO Right 02/20/2022   Procedure: CATARACT EXTRACTION PHACO AND INTRAOCULAR LENS PLACEMENT ([place]) RIGHT;  Surgeon: Leandrew Koyanagi, MD;  Location: [place];  Service: Ophthalmology;  Laterality: Right;  5.77 0:52.3   CATARACT EXTRACTION W/PHACO Left 03/06/2022   Procedure: CATARACT EXTRACTION PHACO AND INTRAOCULAR LENS PLACEMENT ([place]) LEFT;  Surgeon: Leandrew Koyanagi, MD;  Location: [place];  Service: Ophthalmology;  Laterality: Left;  5.77 1:07.5   CERVICAL DISC SURGERY  1987   COLONOSCOPY WITH PROPOFOL N/A 12/30/2017   Procedure: COLONOSCOPY WITH PROPOFOL;  Surgeon: Toledo, Benay Pike, MD;  Location: [facility];  Service: Gastroenterology;  Laterality: N/A;   COLONOSCOPY WITH PROPOFOL N/A 02/02/2021   Procedure: COLONOSCOPY WITH PROPOFOL;  Surgeon: Lesly Rubenstein, MD;  Location: [facility];  Service: Endoscopy;  Laterality: N/A;  [place]   ENDARTERECTOMY FEMORAL Bilateral 11/08/2015   Procedure: ENDARTERECTOMY FEMORAL;  Surgeon: Katha Cabal, MD;  Location: [facility];  Service: Vascular;  Laterality: Bilateral;  common, SFA, and profundis  Aortic endarterectomy    ESOPHAGOGASTRODUODENOSCOPY (EGD) WITH PROPOFOL N/A 09/04/2015   Procedure:  ESOPHAGOGASTRODUODENOSCOPY (EGD) WITH PROPOFOL;  Surgeon: Lucilla Lame, MD;  Location: [place];  Service: Endoscopy;  Laterality: N/A;   ESOPHAGOGASTRODUODENOSCOPY (EGD) WITH PROPOFOL N/A 02/02/2021   Procedure: ESOPHAGOGASTRODUODENOSCOPY (EGD) WITH PROPOFOL;  Surgeon: Lesly Rubenstein, MD;  Location: [facility];  Service: Endoscopy;  Laterality: N/A;  Melena   LUMBAR  LAMINECTOMY  2006   RIGHT/LEFT HEART CATH AND CORONARY ANGIOGRAPHY Bilateral 03/28/2021   Procedure: RIGHT/LEFT HEART CATH AND CORONARY ANGIOGRAPHY;  Surgeon: Yolonda Kida, MD;  Location: [place] [facility];  Service: Cardiovascular;  Laterality: Bilateral;   SEPTOPLASTY N/A 09/28/2020   Procedure: SEPTOPLASTY;  Surgeon: Margaretha Sheffield, MD;  Location: [place];  Service: ENT;  Laterality: N/A;   TEE WITHOUT CARDIOVERSION N/A 09/22/2015   Procedure: TRANSESOPHAGEAL ECHOCARDIOGRAM (TEE);  Surgeon: Teodoro Spray, MD;  Location: [facility];  Service: Cardiovascular;  Laterality: N/A;   TURBINATE REDUCTION Bilateral 09/28/2020   Procedure: TURBINATE REDUCTION;  Surgeon: Margaretha Sheffield, MD;  Location: [place];  Service: ENT;  Laterality: Bilateral;    Medications Prior to Admission  Medication Sig Dispense Refill Last Dose   albuterol (VENTOLIN HFA) 108 (90 Base) MCG/ACT inhaler Inhale 2 puffs into the lungs every 6 (six) hours as needed for wheezing or shortness of breath. 8 g 2 unk   amLODipine (NORVASC) 5 MG tablet Take 5 mg by mouth 2 (two) times daily.   06/17/2022   apixaban (ELIQUIS) 2.5 MG TABS tablet Take 2.5 mg by mouth 2 (two) times daily.   06/17/2022 at 0900   benzonatate (TESSALON) 200 MG capsule Take by mouth.   unk   clopidogrel (PLAVIX) 75 MG tablet Take 75 mg by mouth daily.   06/17/2022   Coenzyme Q10 (COQ10) 200 MG CAPS Take 200 mg by mouth daily.   06/17/2022   fluticasone (FLONASE) 50 MCG/ACT nasal spray Place 1 spray into both nostrils 2 (two) times daily.   unk   furosemide (LASIX) 40 MG tablet Take 1 tablet (40 mg total) by mouth daily. for weight gain or swelling 30 tablet 2 unk   hydrALAZINE (APRESOLINE) 100 MG tablet Take 100 mg by mouth 3 (three) times daily.   06/17/2022   Iron-Vitamin C (VITRON-C) 65-125 MG TABS Take 1 tablet by mouth daily.   06/17/2022   isosorbide mononitrate (IMDUR) 120 MG 24 hr tablet Take 120 mg by mouth daily.   06/17/2022   metolazone  (ZAROXOLYN) 5 MG tablet Take 5 mg by mouth 2 (two) times a week.   Past Month   metoprolol succinate (TOPROL-XL) 25 MG 24 hr tablet Take 1 tablet (25 mg total) by mouth daily. 30 tablet 2 06/17/2022   Multiple Vitamins-Minerals (MULTIVITAMIN ADULT PO) Take 1 tablet by mouth daily. Men   06/17/2022   Omega-3 Fatty Acids (FISH OIL PO) Take by mouth.   06/17/2022   pantoprazole (PROTONIX) 40 MG tablet Take 40 mg by mouth daily.   06/17/2022   rosuvastatin (CRESTOR) 40 MG tablet Take 20 mg by mouth daily.   06/17/2022   vitamin B-12 (CYANOCOBALAMIN) 1000 MCG tablet Take 1,000 mcg by mouth daily.   06/17/2022    Social History   Socioeconomic History   Marital status: Married    Spouse name: Not on file   Number of children: Not on file   Years of education: Not on file   Highest education level: Not on file  Occupational History   Not on file  Tobacco Use   Smoking status: Every Day  Packs/day: 1.00    Years: 45.00    Additional pack years: 0.00    Total pack years: 45.00    Types: Cigarettes   Smokeless tobacco: Never   Tobacco comments:    05/17/21- currently 0.5 PPD  Vaping Use   Vaping Use: Never used  Substance and Sexual Activity   Alcohol use: Yes    Comment: rare 3 a year   Drug use: No   Sexual activity: Not on file  Other Topics Concern   Not on file  Social History Narrative   Not on file   Social Determinants of Health   Financial Resource Strain: Not on file  Food Insecurity: No Food Insecurity (06/18/2022)   Hunger Vital Sign    Worried About Running Out of Food in the Last Year: Never true    Ran Out of Food in the Last Year: Never true  Transportation Needs: No Transportation Needs (06/18/2022)   PRAPARE - Hydrologist (Medical): No    Lack of Transportation (Non-Medical): No  Physical Activity: Not on file  Stress: Not on file  Social Connections: Not on file  Intimate Partner Violence: Not At Risk (06/18/2022)   Humiliation, Afraid,  Rape, and Kick questionnaire    Fear of Current or Ex-Partner: No    Emotionally Abused: No    Physically Abused: No    Sexually Abused: No    Family History  Problem Relation Age of Onset   Heart attack Mother    Hypertension Mother    Varicose Veins Mother    Diabetes Father    Heart attack Father    Heart attack Maternal Grandmother    Stroke Maternal Grandmother       Intake/Output Summary (Last 24 hours) at 06/19/2022 1104 Last data filed at 06/19/2022 0700 Gross per 24 hour  Intake 1015.96 ml  Output 1825 ml  Net -809.04 ml     Vitals:   06/19/22 0600 06/19/22 0803 06/19/22 0830 06/19/22 0901  BP: (!) 140/80     Pulse: 66  69 82  Resp: 13  19   Temp:      TempSrc:      SpO2: 93%  98%   Weight:  83.8 kg    Height:        PHYSICAL EXAM General: Elderly Caucasian male, well nourished, in no acute distress.  Sitting upright in hospital bed in stepdown, nurse at bedside HEENT:  Normocephalic and atraumatic. Neck:  No JVD.  Lungs: Normal work of breathing on oxygen by nasal cannula.  Decreased breath sounds with without appreciable crackles or wheezes  heart: HRRR . Normal S1 and S2 without gallops or murmurs.  Abdomen: Non-distended appearing with excess adiposity.  Msk: Normal strength and tone for age. Extremities: Warm to touch. No clubbing, cyanosis.  Very trace bilateral lower extremity edema, much improved compared to yesterday.   Neuro: Alert and oriented X 3. Psych:  irritable mood but cooperative, answers questions appropriately.   Labs: Basic Metabolic Panel: Recent Labs    06/17/22 1724 06/18/22 0534  NA 137 137  K 3.8 3.5  CL 103 103  CO2 19* 20*  GLUCOSE 161* 111*  BUN 60* 62*  CREATININE 4.04* 3.53*  CALCIUM 8.9 8.4*    Liver Function Tests: No results for input(s): "AST", "ALT", "ALKPHOS", "BILITOT", "PROT", "ALBUMIN" in the last 72 hours. No results for input(s): "LIPASE", "AMYLASE" in the last 72 hours. CBC: Recent Labs     06/17/22  1724 06/18/22 0534  WBC 12.6* 6.9  HGB 12.1* 9.4*  HCT 37.0* 29.0*  MCV 85.3 83.8  PLT 237 172    Cardiac Enzymes: Recent Labs    06/17/22 1457 06/17/22 1724 06/17/22 1944  TROPONINIHS 178* 168* 179*    BNP: Recent Labs    06/17/22 1944  BNP 1,440.5*    D-Dimer: No results for input(s): "DDIMER" in the last 72 hours. Hemoglobin A1C: No results for input(s): "HGBA1C" in the last 72 hours. Fasting Lipid Panel: No results for input(s): "CHOL", "HDL", "LDLCALC", "TRIG", "CHOLHDL", "LDLDIRECT" in the last 72 hours. Thyroid Function Tests: No results for input(s): "TSH", "T4TOTAL", "T3FREE", "THYROIDAB" in the last 72 hours.  Invalid input(s): "FREET3" Anemia Panel: No results for input(s): "VITAMINB12", "FOLATE", "FERRITIN", "TIBC", "IRON", "RETICCTPCT" in the last 72 hours.   Radiology: ECHOCARDIOGRAM COMPLETE  Result Date: 06/18/2022    ECHOCARDIOGRAM REPORT   Patient Name:   Fadi A Phil Dopp. Date of Exam: 06/18/2022 Medical Rec #:  [id number]             Height:       73.0 in Accession #:    [id number]            Weight:       191.0 lb Date of Birth:  10/17/1950             BSA:          2.110 m Patient Age:    25 years              BP:           140/83 mmHg Patient Gender: M                     HR:           91 bpm. Exam Location:  [facility] Procedure: 2D Echo, Cardiac Doppler, Color Doppler and Intracardiac            Opacification Agent Indications:     CHF  History:         Patient has prior history of Echocardiogram examinations. CHF                  and Cardiomyopathy, CAD and Angina, Stroke and PAD,                  Signs/Symptoms:Shortness of Breath; Risk Factors:Hypertension,                  Dyslipidemia and Current Smoker. Patient was unable to lie down                  due to Shortness of breath. Study completed while patient was                  sitting up in bed.  Sonographer:     Wenda Low Referring Phys:  [id number] Athena Masse Diagnosing Phys:  Isaias Cowman MD  Sonographer Comments: Technically difficult study due to poor echo windows. Image acquisition challenging due to respiratory motion. IMPRESSIONS  1. Left ventricular ejection fraction, by estimation, is 55 to 60%. The left ventricle has normal function. The left ventricle has no regional wall motion abnormalities. Left ventricular diastolic parameters were normal.  2. Right ventricular systolic function is normal. The right ventricular size is normal. There is normal pulmonary artery systolic pressure.  3. The mitral valve is normal in structure. Mild mitral valve regurgitation. No evidence of mitral  stenosis.  4. The aortic valve is normal in structure. Aortic valve regurgitation is not visualized. No aortic stenosis is present.  5. The inferior vena cava is normal in size with greater than 50% respiratory variability, suggesting right atrial pressure of 3 mmHg. FINDINGS  Left Ventricle: Left ventricular ejection fraction, by estimation, is 55 to 60%. The left ventricle has normal function. The left ventricle has no regional wall motion abnormalities. Definity contrast agent was given IV to delineate the left ventricular  endocardial borders. The left ventricular internal cavity size was normal in size. There is no left ventricular hypertrophy. Left ventricular diastolic parameters were normal. Right Ventricle: The right ventricular size is normal. No increase in right ventricular wall thickness. Right ventricular systolic function is normal. There is normal pulmonary artery systolic pressure. The tricuspid regurgitant velocity is 2.39 m/s, and  with an assumed right atrial pressure of 3 mmHg, the estimated right ventricular systolic pressure is 0000000 mmHg. Left Atrium: Left atrial size was normal in size. Right Atrium: Right atrial size was normal in size. Pericardium: There is no evidence of pericardial effusion. Mitral Valve: The mitral valve is normal in structure. Mild mitral valve  regurgitation. No evidence of mitral valve stenosis. MV peak gradient, 4.9 mmHg. The mean mitral valve gradient is 2.0 mmHg. Tricuspid Valve: The tricuspid valve is normal in structure. Tricuspid valve regurgitation is mild . No evidence of tricuspid stenosis. Aortic Valve: The aortic valve is normal in structure. Aortic valve regurgitation is not visualized. No aortic stenosis is present. Aortic valve mean gradient measures 3.0 mmHg. Aortic valve peak gradient measures 6.1 mmHg. Aortic valve area, by VTI measures 2.10 cm. Pulmonic Valve: The pulmonic valve was normal in structure. Pulmonic valve regurgitation is not visualized. No evidence of pulmonic stenosis. Aorta: The aortic root is normal in size and structure. Venous: The inferior vena cava is normal in size with greater than 50% respiratory variability, suggesting right atrial pressure of 3 mmHg. IAS/Shunts: No atrial level shunt detected by color flow Doppler.  LEFT VENTRICLE PLAX 2D LVIDd:         5.40 cm   Diastology LVIDs:         3.50 cm   LV e' medial:    4.90 cm/s LV PW:         1.00 cm   LV E/e' medial:  22.9 LV IVS:        1.00 cm   LV e' lateral:   8.81 cm/s LVOT diam:     1.90 cm   LV E/e' lateral: 12.7 LV SV:         55 LV SV Index:   26 LVOT Area:     2.84 cm  RIGHT VENTRICLE RV Basal diam:  3.60 cm RV Mid diam:    3.40 cm RV S prime:     11.20 cm/s TAPSE (M-mode): 3.2 cm LEFT ATRIUM             Index        RIGHT ATRIUM           Index LA diam:        4.40 cm 2.09 cm/m   RA Area:     20.90 cm LA Vol (A2C):   68.9 ml 32.65 ml/m  RA Volume:   63.00 ml  29.86 ml/m LA Vol (A4C):   47.3 ml 22.42 ml/m LA Biplane Vol: 57.8 ml 27.39 ml/m  AORTIC VALVE  PULMONIC VALVE AV Area (Vmax):    2.35 cm     PV Vmax:       0.95 m/s AV Area (Vmean):   2.10 cm     PV Peak grad:  3.6 mmHg AV Area (VTI):     2.10 cm AV Vmax:           123.00 cm/s AV Vmean:          84.000 cm/s AV VTI:            0.263 m AV Peak Grad:      6.1 mmHg AV Mean  Grad:      3.0 mmHg LVOT Vmax:         102.00 cm/s LVOT Vmean:        62.100 cm/s LVOT VTI:          0.195 m LVOT/AV VTI ratio: 0.74  AORTA Ao Root diam: 3.60 cm MITRAL VALVE                TRICUSPID VALVE MV Area (PHT): 5.42 cm     TR Peak grad:   22.8 mmHg MV Area VTI:   1.86 cm     TR Vmax:        239.00 cm/s MV Peak grad:  4.9 mmHg MV Mean grad:  2.0 mmHg     SHUNTS MV Vmax:       1.11 m/s     Systemic VTI:  0.20 m MV Vmean:      70.1 cm/s    Systemic Diam: 1.90 cm MV Decel Time: 140 msec MV E velocity: 112.00 cm/s MV A velocity: 68.70 cm/s MV E/A ratio:  1.63 Isaias Cowman MD Electronically signed by Isaias Cowman MD Signature Date/Time: 06/18/2022/5:00:52 PM    Final    DG Chest Port 1 View  Result Date: 06/17/2022 CLINICAL DATA:  Shortness of breath EXAM: PORTABLE CHEST 1 VIEW COMPARISON:  10/29/2021 FINDINGS: Transverse diameter of heart is within normal limits. Central pulmonary vessels are more prominent. Increased interstitial markings are seen in both lungs, more so in right lower lung field. There is blunting of right lateral CP angle. There is no pneumothorax. IMPRESSION: Central pulmonary vessels are more prominent suggesting CHF. Increased interstitial markings are seen in both lungs, more so in the right lower lung field. This may suggest pulmonary edema or multifocal pneumonia. Small right pleural effusion. Electronically Signed   By: Elmer Picker M.D.   On: 06/17/2022 17:48    04/16/21 Single vessel coronary artery disease.  Successful PCI of prox-mid RCA  IVUS used for optimization   3.0 mm PTCA balloon used to pre-dilate  3.0 x 48-mm Synergy DES  3.5 mm Haw[place]orne balloon distally  4.0 mm Bowdle balloon in mid RCA  4.5 mm [place] ballloon in prox RCA   Loss of atrial side branch but excellent overall result   Recommendations:   Risk factor modification and secondary prevention measures  Aggressive medical therapy for CAD  Routine post-PCI care; sheath out, TR band applied   Refer for cardiac rehab in 2-3 weeks  Aspirin 81 mg x 7 days; P2Y12 inhibitor for at least 6 months; Avoid elective surgery while receiving a P2Y12 inhibitor  Resume Xarelto tomorrow  Same day PCI discharge  Follow up with Dr. Clayborn Bigness in [facility]   Operating Room Services 03/29/11   Mid LM to Dist LM lesion is 50% stenosed.   Ost RCA lesion is 70% stenosed.   Prox RCA-1 lesion is 80% stenosed.  Prox RCA-2 lesion is 80% stenosed.   Dist LAD-2 lesion is 50% stenosed.   Dist LAD-1 lesion is 50% stenosed.   1st Diag lesion is 70% stenosed.   Ost Cx lesion is 50% stenosed.   The left ventricular systolic function is normal.   LV end diastolic pressure is normal.   The left ventricular ejection fraction is 55-65% by visual estimate.   Hemodynamic findings consistent with moderate pulmonary hypertension.   Right heart cath right brachial Wedge 29/36 mean of 24 PA 53/24 mean of 38 RV [id number] end-diastolic of 16 RA [id number] mean of 14 LV 0000000 end-diastolic of 28 Ao [id number] Ao sat 96% PA sat 70% Cardiac output Fick 6.38 Index 3.13 Measurement suggests moderate pulmonary hypertension   Left heart cath right ulnar Normal left ventricular function EF of around 60% Coronaries Left main large minor disease ostially 40-50% distal at bifurcation LAD diffuse 50% LAD lesion proximal mid and distal Diagonal 1 small to medium 70% proximal Circumflex medium to large size with minor irregularities RCA large 50% proximal 80% proximal 80% proximal serial lesions   Patient tolerated procedure well TR band applied   Plan Refer the patient to pulmonary for moderate pulm hypertension Consider referral to tertiary care center for evaluation of distal left main as well as RCA and ostial circumflex for possible intervention  vs coronary bypass surgery  ECHO 10/30/2021 1. Left ventricular ejection fraction, by estimation, is 55 to 60%. The  left ventricle has normal function. The left ventricle has no regional   wall motion abnormalities. Left ventricular diastolic parameters were  normal.   2. Right ventricular systolic function is normal. The right ventricular  size is normal.   3. The mitral valve is normal in structure. Mild mitral valve  regurgitation. No evidence of mitral stenosis.   4. The aortic valve is normal in structure. Aortic valve regurgitation is  not visualized. No aortic stenosis is present.   5. The inferior vena cava is normal in size with greater than 50%  respiratory variability, suggesting right atrial pressure of 3 mmHg.   TELEMETRY reviewed by me (LT) 06/19/2022 : Sinus rhythm rate 60s to 70s  EKG reviewed by me: NSR 1* AV block inferolateral ST depressions, improved on repeat  Data reviewed by me (LT) 06/19/2022: Hospitalist progress note, nursing notes, nephrology progress note last 24h vitals tele labs imaging I/O   Principal Problem:   Acute on chronic systolic CHF (congestive heart failure) Active Problems:   PAD s/p aortobifemoral bypass 2017 (peripheral artery disease)   Acute respiratory failure with hypoxia   Acute renal failure superimposed on stage 4 chronic kidney disease   History of CVA and bilateral carotid artery stenosis (cerebrovascular accident)   Chronic anticoagulation   CAD S/P percutaneous coronary angioplasty   Ischemic cardiomyopathy   Unstable angina   Hypertensive emergency   Acute CHF (congestive heart failure)    ASSESSMENT AND PLAN:  Brnadon A. Phil Dopp. Is a 83yoM with a PMH of 1vCAD s/p complex PCI with DES to prox-mid RCA (04/16/21, [facility]), mod pulmonary HTN, HFpEF (55-60%, mild MR 10/2021), CKD 4, atrophic R kidney & R renal artery stenosis, PAD s/p aorto-bifem bypass (2017), moderate bilat carotid artery stenosis, hx CVA who presented to [facility] ED 06/17/22 with progressive shortness of breath for several days, that acutely worsened yesterday morning.  The patient had outpatient labs that his PCP that showed significantly worsening  creatinine for which she was referred to the emergency department.  Cardiology  is consulted for assistance with his heart failure.  #?  URI # Hypertensive urgency # Acute on chronic HFpEF Symptomatic with several days of shortness of breath and orthopnea and upper respiratory congestion noticed 3-4 pound weight gain.  Presents with hypertensive urgency with BP 200/98 and flash pulmonary edema, hypervolemic appearing clinically with 1-2+ ankle edema and vascular congestion on chest x-ray. BNP elevated at 2155.  Initially requiring BiPAP overnight, clinical improvement after nitroglycerin infusions and IV Lasix. -Wean oxygen as tolerated -stop nitroglycerin drip this AM -increase Isordil from 30 mg to 40mg  3 times daily  -Continue hydralazine 100 mg 3 times daily -Recheck BMP this morning before further Lasix dosing (on 20 mg p.o. Lasix twice daily and as needed metolazone at home -continue metoprolol XL from 25 mg to 50 mg daily -restarting amlodipine if BP remains elevated despite the above medications -Echo complete resulted with preserved LVEF, and mild MR and no RWMA's -Salt and fluid restriction, daily weights  # AKI on CKD 4 The patient noticed diminished urine output in the days leading up to his presentation despite his normal Lasix 20 mg twice daily.  Baseline creatinine anywhere from 2.1-2.9, presents with BUN/creatinine 60/4.04 and GFR 15, fortunately improving after initial diuresis to BUN/creatinine 62/3 0.53 and GFR 18 today -Labs pending, nephrology following  # Demand ischemia # CAD s/p complex PCI x 1 to mid RCA (03/2021) without chest pain Reported chest tightness primarily associated with respirations that improved immediately after he was placed on BiPAP.  EKG with slight inferolateral ST depressions, possibly rate related, that improved on repeat EKG.  Troponins elevated with a flat trend at 178, 168, 179, and he has had no further chest discomfort noted at my time of  evaluation.  This troponin elevation is most consistent with demand/supply mismatch and not ACS, in the setting of hypertensive urgency and flash pulmonary edema and known CKD 4. -Can switch from heparin to Eliquis today, pending stability of H&H -S/p aspirin 325 mg x 1, continue clopidogrel 75 mg daily -Continue rosuvastatin 20 mg daily -No plan for additional invasive cardiac diagnostics at this time  # PAD s/p aortobifemoral bypass (2017) Home medications include clopidogrel 75 mg daily and Eliquis 2.5 mg twice daily  # Tobacco abuse Historically smoked 3 to 4 packs/day currently smokes 1 to 2 cigarettes/day.  Is motivated to quit entirely, declines nicotine patch.  This patient's plan of care was discussed and created with Dr. Saralyn Pilar and he is in agreement.  Signed: Tristan Schroeder , PA-C 06/19/2022, 11:04 AM [facility]

## 2022-06-19 NOTE — Plan of Care (Signed)
  Problem: Clinical Measurements: Goal: Diagnostic test results will improve Outcome: Progressing Goal: Respiratory complications will improve Outcome: Progressing   

## 2022-06-19 NOTE — Progress Notes (Signed)
[facility]  ROUNDING NOTE   Subjective:   [name] was admitted to [facility] on 06/17/2022 for Acute pulmonary edema [J81.0] CHF exacerbation [I50.9] Acute CHF (congestive heart failure) [I50.9] AKI (acute kidney injury) [N17.9] Acute renal failure superimposed on stage 4 chronic kidney disease, unspecified acute renal failure type [N17.9, N18.4]  In the ICU. NTG infusion stopped this morning.   UOP 1568mL - furosemide a total of 160mg  IV yesterday.   Creatinine 3.53 (4.04)  Weaned down to [place] 3L O2   Objective:  Vital signs in last 24 hours:  Temp:  [98 F (36.7 C)-98.7 F (37.1 C)] 98.7 F (37.1 C) (04/02 2255) Pulse Rate:  [31-97] 82 (04/03 0901) Resp:  [12-49] 19 (04/03 0830) BP: (110-188)/(63-105) 140/80 (04/03 0600) SpO2:  [92 %-100 %] 98 % (04/03 0830) FiO2 (%):  [30 %] 30 % (04/03 0402) Weight:  [83.8 kg] 83.8 kg (04/03 0803)  Weight change:  Filed Weights   06/17/22 1941 06/19/22 0803  Weight: 86.6 kg 83.8 kg    Intake/Output: I/O last 3 completed shifts: In: 1540.1 [I.V.:1540.1] Out: 1825 [Urine:1825]   Intake/Output this shift:  Total I/O In: -  Out: 400 [Urine:400]  Physical Exam: General: Critically ill  Head: [place]/AT  Eyes: Anicteric, PERRL  Neck: Supple, trachea midline  Lungs:  Bilateral crackles.   Heart: Regular rate and rhythm  Abdomen:  Soft, nontender,   Extremities:  no peripheral edema.  Neurologic: Nonfocal, moving all four extremities  Skin: No lesions        Basic Metabolic Panel: Recent Labs  Lab 06/17/22 1724 06/18/22 0534  NA 137 137  K 3.8 3.5  CL 103 103  CO2 19* 20*  GLUCOSE 161* 111*  BUN 60* 62*  CREATININE 4.04* 3.53*  CALCIUM 8.9 8.4*     Liver Function Tests: No results for input(s): "AST", "ALT", "ALKPHOS", "BILITOT", "PROT", "ALBUMIN" in the last 168 hours. No results for input(s): "LIPASE", "AMYLASE" in the last 168 hours. No results for input(s): "AMMONIA" in the last 168  hours.  CBC: Recent Labs  Lab 06/17/22 1724 06/18/22 0534  WBC 12.6* 6.9  HGB 12.1* 9.4*  HCT 37.0* 29.0*  MCV 85.3 83.8  PLT 237 172     Cardiac Enzymes: No results for input(s): "CKTOTAL", "CKMB", "CKMBINDEX", "TROPONINI" in the last 168 hours.  BNP: Invalid input(s): "POCBNP"  CBG: Recent Labs  Lab 06/18/22 2311  GLUCAP 92    Microbiology: Results for orders placed or performed during the hospital encounter of 06/17/22  MRSA Next Gen by PCR, Nasal     Status: None   Collection Time: 06/18/22 11:14 PM   Specimen: Nasal Mucosa; Nasal Swab  Result Value Ref Range Status   MRSA by PCR Next Gen NOT DETECTED NOT DETECTED Final    Comment: (NOTE) The GeneXpert MRSA Assay (FDA approved for NASAL specimens only), is one component of a comprehensive MRSA colonization surveillance program. It is not intended to diagnose MRSA infection nor to guide or monitor treatment for MRSA infections. Test performance is not FDA approved in patients less than 64 years old. Performed at [facility], [address] [place], [place] [address]     Coagulation Studies: No results for input(s): "LABPROT", "INR" in the last 72 hours.  Urinalysis: No results for input(s): "COLORURINE", "LABSPEC", "PHURINE", "GLUCOSEU", "HGBUR", "BILIRUBINUR", "KETONESUR", "PROTEINUR", "UROBILINOGEN", "NITRITE", "LEUKOCYTESUR" in the last 72 hours.  Invalid input(s): "APPERANCEUR"    Imaging: ECHOCARDIOGRAM COMPLETE  Result Date:  06/18/2022    ECHOCARDIOGRAM REPORT   Patient Name:   Gary Mccoy. Date of Exam: 06/18/2022 Medical Rec #:  [id number]             Height:       73.0 in Accession #:    [id number]            Weight:       191.0 lb Date of Birth:  [birth year]             BSA:          2.110 m Patient Age:    72 years              BP:           140/83 mmHg Patient Gender: M                     HR:           91 bpm. Exam Location:  [facility] Procedure: 2D Echo, Cardiac Doppler, Color  Doppler and Intracardiac            Opacification Agent Indications:     CHF  History:         Patient has prior history of Echocardiogram examinations. CHF                  and Cardiomyopathy, CAD and Angina, Stroke and PAD,                  Signs/Symptoms:Shortness of Breath; Risk Factors:Hypertension,                  Dyslipidemia and Current Smoker. Patient was unable to lie down                  due to Shortness of breath. Study completed while patient was                  sitting up in bed.  Sonographer:     Wenda Low Referring Phys:  [id number] Athena Masse Diagnosing Phys: Isaias Cowman MD  Sonographer Comments: Technically difficult study due to poor echo windows. Image acquisition challenging due to respiratory motion. IMPRESSIONS  1. Left ventricular ejection fraction, by estimation, is 55 to 60%. The left ventricle has normal function. The left ventricle has no regional wall motion abnormalities. Left ventricular diastolic parameters were normal.  2. Right ventricular systolic function is normal. The right ventricular size is normal. There is normal pulmonary artery systolic pressure.  3. The mitral valve is normal in structure. Mild mitral valve regurgitation. No evidence of mitral stenosis.  4. The aortic valve is normal in structure. Aortic valve regurgitation is not visualized. No aortic stenosis is present.  5. The inferior vena cava is normal in size with greater than 50% respiratory variability, suggesting right atrial pressure of 3 mmHg. FINDINGS  Left Ventricle: Left ventricular ejection fraction, by estimation, is 55 to 60%. The left ventricle has normal function. The left ventricle has no regional wall motion abnormalities. Definity contrast agent was given IV to delineate the left ventricular  endocardial borders. The left ventricular internal cavity size was normal in size. There is no left ventricular hypertrophy. Left ventricular diastolic parameters were normal. Right  Ventricle: The right ventricular size is normal. No increase in right ventricular wall thickness. Right ventricular systolic function is normal. There is normal pulmonary artery systolic pressure. The tricuspid regurgitant velocity is 2.39 m/s, and  with an assumed right atrial pressure of 3 mmHg, the estimated right ventricular systolic pressure is 0000000 mmHg. Left Atrium: Left atrial size was normal in size. Right Atrium: Right atrial size was normal in size. Pericardium: There is no evidence of pericardial effusion. Mitral Valve: The mitral valve is normal in structure. Mild mitral valve regurgitation. No evidence of mitral valve stenosis. MV peak gradient, 4.9 mmHg. The mean mitral valve gradient is 2.0 mmHg. Tricuspid Valve: The tricuspid valve is normal in structure. Tricuspid valve regurgitation is mild . No evidence of tricuspid stenosis. Aortic Valve: The aortic valve is normal in structure. Aortic valve regurgitation is not visualized. No aortic stenosis is present. Aortic valve mean gradient measures 3.0 mmHg. Aortic valve peak gradient measures 6.1 mmHg. Aortic valve area, by VTI measures 2.10 cm. Pulmonic Valve: The pulmonic valve was normal in structure. Pulmonic valve regurgitation is not visualized. No evidence of pulmonic stenosis. Aorta: The aortic root is normal in size and structure. Venous: The inferior vena cava is normal in size with greater than 50% respiratory variability, suggesting right atrial pressure of 3 mmHg. IAS/Shunts: No atrial level shunt detected by color flow Doppler.  LEFT VENTRICLE PLAX 2D LVIDd:         5.40 cm   Diastology LVIDs:         3.50 cm   LV e' medial:    4.90 cm/s LV PW:         1.00 cm   LV E/e' medial:  22.9 LV IVS:        1.00 cm   LV e' lateral:   8.81 cm/s LVOT diam:     1.90 cm   LV E/e' lateral: 12.7 LV SV:         55 LV SV Index:   26 LVOT Area:     2.84 cm  RIGHT VENTRICLE RV Basal diam:  3.60 cm RV Mid diam:    3.40 cm RV S prime:     11.20 cm/s TAPSE  (M-mode): 3.2 cm LEFT ATRIUM             Index        RIGHT ATRIUM           Index LA diam:        4.40 cm 2.09 cm/m   RA Area:     20.90 cm LA Vol (A2C):   68.9 ml 32.65 ml/m  RA Volume:   63.00 ml  29.86 ml/m LA Vol (A4C):   47.3 ml 22.42 ml/m LA Biplane Vol: 57.8 ml 27.39 ml/m  AORTIC VALVE                    PULMONIC VALVE AV Area (Vmax):    2.35 cm     PV Vmax:       0.95 m/s AV Area (Vmean):   2.10 cm     PV Peak grad:  3.6 mmHg AV Area (VTI):     2.10 cm AV Vmax:           123.00 cm/s AV Vmean:          84.000 cm/s AV VTI:            0.263 m AV Peak Grad:      6.1 mmHg AV Mean Grad:      3.0 mmHg LVOT Vmax:         102.00 cm/s LVOT Vmean:        62.100 cm/s LVOT  VTI:          0.195 m LVOT/AV VTI ratio: 0.74  AORTA Ao Root diam: 3.60 cm MITRAL VALVE                TRICUSPID VALVE MV Area (PHT): 5.42 cm     TR Peak grad:   22.8 mmHg MV Area VTI:   1.86 cm     TR Vmax:        239.00 cm/s MV Peak grad:  4.9 mmHg MV Mean grad:  2.0 mmHg     SHUNTS MV Vmax:       1.11 m/s     Systemic VTI:  0.20 m MV Vmean:      70.1 cm/s    Systemic Diam: 1.90 cm MV Decel Time: 140 msec MV E velocity: 112.00 cm/s MV A velocity: 68.70 cm/s MV E/A ratio:  1.63 Isaias Cowman MD Electronically signed by Isaias Cowman MD Signature Date/Time: 06/18/2022/5:00:52 PM    Final    DG Chest Port 1 View  Result Date: 06/17/2022 CLINICAL DATA:  Shortness of breath EXAM: PORTABLE CHEST 1 VIEW COMPARISON:  10/29/2021 FINDINGS: Transverse diameter of heart is within normal limits. Central pulmonary vessels are more prominent. Increased interstitial markings are seen in both lungs, more so in right lower lung field. There is blunting of right lateral CP angle. There is no pneumothorax. IMPRESSION: Central pulmonary vessels are more prominent suggesting CHF. Increased interstitial markings are seen in both lungs, more so in the right lower lung field. This may suggest pulmonary edema or multifocal pneumonia. Small right  pleural effusion. Electronically Signed   By: Elmer Picker M.D.   On: 06/17/2022 17:48     Medications:    heparin 1,200 Units/hr (06/18/22 1729)    amLODipine  5 mg Oral Daily   Chlorhexidine Gluconate Cloth  6 each Topical Daily   clopidogrel  75 mg Oral Daily   fluticasone  1 spray Each Nare Daily   hydrALAZINE  100 mg Oral TID   isosorbide dinitrate  40 mg Oral TID   metoprolol succinate  25 mg Oral Daily   rosuvastatin  20 mg Oral Daily   acetaminophen, albuterol, nitroGLYCERIN, ondansetron (ZOFRAN) IV, sodium chloride  Assessment/ Plan:  [name] is a 72 y.o. white male with congestive heart failure, coronary artery disease, gastric ulcer disease, peripheral vascular disease, stroke, COPD who is admitted to [facility] on 06/17/2022 for Acute pulmonary edema [J81.0] CHF exacerbation [I50.9] Acute CHF (congestive heart failure) [I50.9] AKI (acute kidney injury) [N17.9] Acute renal failure superimposed on stage 4 chronic kidney disease, unspecified acute renal failure type [N17.9, N18.4]  Acute Kidney Injury on chronic kidney disease on chronic kidney disease stage IV: baseline creatinine of 2.41, GFR of 28. 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 Acute kidney injury secondary to acute cardiorenal syndrome. No acute indication for dialysis. History of intolerance to olmesartan. Not currently on an SGLT-2 inhibitor.   Acute exacerbation of chronic systolic and diastolic congestive heart failure. Echocardiogram reviewed. Home regimen of furosemide 40mg  daily and metolazone 5mg  twice a week.  - Discontinued Furosemide IV  - plan on restarting home diuretic regimen tomorrow.   Anemia with chronic kidney disease: hemoglobin dropped from 12.1 to 9.4. No CBC today.   Hypertension with chronic kidney disease: placed on NTG gtt on admission. Restarted metoprolol, isosorbide mononitrate and hydralazine.   Secondary Hyperparathyroidism: labs on 05/28/22:  PTH 167, phosphorus and calcium at goal. Not on  a vitamin D agent.    LOS: 2 Gary Mccoy 4/3/202411:56 AM

## 2022-06-20 DIAGNOSIS — I5023 Acute on chronic systolic (congestive) heart failure: Secondary | ICD-10-CM | POA: Diagnosis not present

## 2022-06-20 LAB — BASIC METABOLIC PANEL
Anion gap: 10 (ref 5–15)
BUN: 53 mg/dL — ABNORMAL HIGH (ref 8–23)
CO2: 23 mmol/L (ref 22–32)
Calcium: 8.9 mg/dL (ref 8.9–10.3)
Chloride: 105 mmol/L (ref 98–111)
Creatinine, Ser: 2.57 mg/dL — ABNORMAL HIGH (ref 0.61–1.24)
GFR, Estimated: 26 mL/min — ABNORMAL LOW (ref >60)
Glucose, Bld: 98 mg/dL (ref 70–99)
Potassium: 3.5 mmol/L (ref 3.5–5.1)
Sodium: 138 mmol/L (ref 135–145)

## 2022-06-20 LAB — CBC
HCT: 31.7 % — ABNORMAL LOW (ref 39.0–52.0)
Hemoglobin: 10.2 g/dL — ABNORMAL LOW (ref 13.0–17.0)
MCH: 27.1 pg (ref 26.0–34.0)
MCHC: 32.2 g/dL (ref 30.0–36.0)
MCV: 84.3 fL (ref 80.0–100.0)
Platelets: 191 10*3/uL (ref 150–400)
RBC: 3.76 MIL/uL — ABNORMAL LOW (ref 4.22–5.81)
RDW: 16.4 % — ABNORMAL HIGH (ref 11.5–15.5)
WBC: 6.4 10*3/uL (ref 4.0–10.5)
nRBC: 0 % (ref 0.0–0.2)

## 2022-06-20 MED ORDER — OMEGA-3-ACID ETHYL ESTERS 1 G PO CAPS
1.0000 g | ORAL_CAPSULE | Freq: Every day | ORAL | Status: DC
  Administered 2022-06-20: 1 g via ORAL
  Filled 2022-06-20: qty 1

## 2022-06-20 MED ORDER — NITROGLYCERIN 0.4 MG SL SUBL: 0.4000 mg | SUBLINGUAL_TABLET | SUBLINGUAL | 12 refills | Status: AC | PRN

## 2022-06-20 MED ORDER — COQ10 200 MG PO CAPS: 200.0000 mg | ORAL_CAPSULE | Freq: Every day | ORAL | Status: DC

## 2022-06-20 MED ORDER — FUROSEMIDE 40 MG PO TABS
40.0000 mg | ORAL_TABLET | Freq: Every day | ORAL | Status: DC
  Administered 2022-06-20: 40 mg via ORAL
  Filled 2022-06-20: qty 1

## 2022-06-20 MED ORDER — ADULT MULTIVITAMIN W/MINERALS CH
1.0000 | ORAL_TABLET | Freq: Every day | ORAL | Status: DC
  Administered 2022-06-20: 1 via ORAL
  Filled 2022-06-20: qty 1

## 2022-06-20 MED ORDER — PANTOPRAZOLE SODIUM 40 MG PO TBEC
40.0000 mg | DELAYED_RELEASE_TABLET | Freq: Every day | ORAL | Status: DC
  Administered 2022-06-20: 40 mg via ORAL
  Filled 2022-06-20: qty 1

## 2022-06-20 MED ORDER — NITROGLYCERIN 0.4 MG SL SUBL: 0.4000 mg | SUBLINGUAL_TABLET | SUBLINGUAL | 12 refills | Status: DC | PRN

## 2022-06-20 MED ORDER — ISOSORBIDE DINITRATE 40 MG PO TABS: 40.0000 mg | ORAL_TABLET | Freq: Three times a day (TID) | ORAL | 1 refills | Status: DC

## 2022-06-20 MED ORDER — VITAMIN B-12 1000 MCG PO TABS
1000.0000 ug | ORAL_TABLET | Freq: Every day | ORAL | Status: DC
  Administered 2022-06-20: 1000 ug via ORAL
  Filled 2022-06-20: qty 1

## 2022-06-20 MED ORDER — ALBUTEROL SULFATE HFA 108 (90 BASE) MCG/ACT IN AERS: 2.0000 | INHALATION_SPRAY | Freq: Four times a day (QID) | RESPIRATORY_TRACT | 3 refills | Status: AC | PRN

## 2022-06-20 NOTE — Care Management Important Message (Signed)
Important Message  Patient Details  Name: Gary Mccoy. MRN: [id number] Date of Birth: 1950-08-16   Medicare Important Message Given:  Yes     [name] 06/20/2022, 3:13 PM

## 2022-06-20 NOTE — TOC Progression Note (Signed)
Transition of Care (TOC) - Progression Note    Patient Details  Name: Gary Mccoy. MRN: [id number] Date of Birth: [birth year]  Transition of Care [facility]) CM/SW Contact  Laurena Slimmer, RN Phone Number: 06/20/2022, 1:25 PM  Clinical Narrative:    Spoke with patient regarding new home oxgygen set up for home.  He did not have a choice of DME agency. He has been advised his oxygen will be delivered here to the hospital and the remaining supplies will be set up in his home after discharge.   Referral sent to [place] from [place].         Expected Discharge Plan and Services                                               Social Determinants of Health (SDOH) Interventions SDOH Screenings   Food Insecurity: No Food Insecurity (06/18/2022)  Housing: Low Risk  (06/18/2022)  Transportation Needs: No Transportation Needs (06/18/2022)  Utilities: Not At Risk (06/18/2022)  Depression (PHQ2-9): Low Risk  (11/23/2021)  Tobacco Use: High Risk (06/19/2022)    Readmission Risk Interventions     No data to display

## 2022-06-20 NOTE — Consult Note (Signed)
   Heart Failure Nurse Navigator Note  HFpEF 55 to 60%.  Mild mitral regurgitation.  He presented to the emergency room with complaints of shortness of breath, chest pressure and blood pressure of 220/98.  BNP was 1440.  Chest x-ray revealed pulmonary vascular congestion/multifocal pneumonia.  Comorbidities:  Arthritis Coronary artery disease Peripheral arterial disease Stroke Bilateral carotid artery stenosis Chronic kidney disease stage IV  Medications:  Amlodipine 5 mg daily Apixaban 2.5 mg 2 times a day Plavix 75 mg daily Furosemide 40 mg daily by mouth Apresoline 100 mg 3 times a day Isosorbide dinitrate 40 mg 3 times a day Metoprolol succinate 25 mg daily Crestor 20 mg daily  Labs:  Sodium 138, potassium 3.5, chloride 105, CO2 23, BUN 53, creatinine 2.57 down from 2.88 of yesterday.  Estimated GFR 26. Weight 81.3 kg Intake 1070 mL Output 1775 mL   Initial meeting with patient and his wife on this admission.  He remembers talking with me approximately 1-1/2 years ago.  He follows with Gary Mccoy in the outpatient heart failure clinic.  He states that he knew he was getting into trouble but he thought it was more due to his sinus problems.  He does weigh himself daily and had noted a weight gain.  He also noted more lower extremity swelling along with distention of his abdomen.  Discussed reporting 2 to 3 pound weight gain overnight or 5 pounds within the week.  He asked for clarification about the heart failure clinic giving IV Lasix/SQ furoisx.  I explained to him how it worked if he would notify Gary Mccoy when he noted weight gain or changes in his symptoms rather than waiting until the time that he was in such a condition he would have to be admitted.  He voices understanding.  He also states his PCP, Dr. Sabra Mccoy has given him instructions on taking extra lasix with weight gain.  He states that the diet is a problem.  He has problems with taste anyway, that most food  is flat and he does admit to using a little salt on some things.  Stressed sticking with fluid restriction of no more than 64 ounces a day.  He states that he mostly drinks water but will have an occasional soda.  Spent grater than 30 minutes with patient and his wife.  Gary Riffle RN CHFN

## 2022-06-20 NOTE — Progress Notes (Signed)
[facility]  ROUNDING NOTE   Subjective:   [name] was admitted to [facility] on 06/17/2022 for Acute pulmonary edema [J81.0] CHF exacerbation [I50.9] Acute CHF (congestive heart failure) [I50.9] AKI (acute kidney injury) [N17.9] Acute renal failure superimposed on stage 4 chronic kidney disease, unspecified acute renal failure type [N17.9, N18.4]  Patient seen sitting at side of bed Complains of lower back pain, stretching Remains on 2L [place].  Non productive cough  Creatinine 2.57 UOP 1744ml  Objective:  Vital signs in last 24 hours:  Temp:  [97.4 F (36.3 C)-98.3 F (36.8 C)] 98.3 F (36.8 C) (04/04 1119) Pulse Rate:  [54-148] 71 (04/04 1119) Resp:  [14-20] 16 (04/04 1119) BP: (141-160)/(57-91) 149/57 (04/04 1119) SpO2:  [93 %-100 %] 95 % (04/04 1119) FiO2 (%):  [30 %] 30 % (04/03 2349) Weight:  [81.3 kg-81.8 kg] 81.3 kg (04/04 0121)  Weight change:  Filed Weights   06/19/22 0803 06/19/22 1917 06/20/22 0121  Weight: 83.8 kg 81.8 kg 81.3 kg    Intake/Output: I/O last 3 completed shifts: In: 2086.8 [P.O.:960; I.V.:1126.8] Out: 3600 [Urine:3600]   Intake/Output this shift:  Total I/O In: -  Out: 300 [Urine:300]  Physical Exam: General: NAD  Head: [place]/AT  Eyes: Anicteric  Lungs:  R[place]chus    Heart: Regular rate and rhythm  Abdomen:  Soft, nontender  Extremities:  no peripheral edema.  Neurologic: Nonfocal, moving all four extremities  Skin: No lesions        Basic Metabolic Panel: Recent Labs  Lab 06/17/22 1724 06/18/22 0534 06/19/22 1213 06/20/22 0541  NA 137 137 135 138  K 3.8 3.5 3.5 3.5  CL 103 103 104 105  CO2 19* 20* 22 23  GLUCOSE 161* 111* 100* 98  BUN 60* 62* 62* 53*  CREATININE 4.04* 3.53* 2.88* 2.57*  CALCIUM 8.9 8.4* 8.6* 8.9     Liver Function Tests: No results for input(s): "AST", "ALT", "ALKPHOS", "BILITOT", "PROT", "ALBUMIN" in the last 168 hours. No results for input(s): "LIPASE", "AMYLASE" in the  last 168 hours. No results for input(s): "AMMONIA" in the last 168 hours.  CBC: Recent Labs  Lab 06/17/22 1724 06/18/22 0534 06/19/22 1213 06/20/22 0541  WBC 12.6* 6.9 7.5 6.4  HGB 12.1* 9.4* 10.3* 10.2*  HCT 37.0* 29.0* 31.0* 31.7*  MCV 85.3 83.8 82.7 84.3  PLT 237 172 187 191     Cardiac Enzymes: No results for input(s): "CKTOTAL", "CKMB", "CKMBINDEX", "TROPONINI" in the last 168 hours.  BNP: Invalid input(s): "POCBNP"  CBG: Recent Labs  Lab 06/18/22 2311  GLUCAP 92     Microbiology: Results for orders placed or performed during the hospital encounter of 06/17/22  MRSA Next Gen by PCR, Nasal     Status: None   Collection Time: 06/18/22 11:14 PM   Specimen: Nasal Mucosa; Nasal Swab  Result Value Ref Range Status   MRSA by PCR Next Gen NOT DETECTED NOT DETECTED Final    Comment: (NOTE) The GeneXpert MRSA Assay (FDA approved for NASAL specimens only), is one component of a comprehensive MRSA colonization surveillance program. It is not intended to diagnose MRSA infection nor to guide or monitor treatment for MRSA infections. Test performance is not FDA approved in patients less than 44 years old. Performed at [facility], [address] [place], [place] [address]     Coagulation Studies: No results for input(s): "LABPROT", "INR" in the last 72 hours.  Urinalysis: No results for input(s): "COLORURINE", "LABSPEC", "PHURINE", "  GLUCOSEU", "HGBUR", "BILIRUBINUR", "KETONESUR", "PROTEINUR", "UROBILINOGEN", "NITRITE", "LEUKOCYTESUR" in the last 72 hours.  Invalid input(s): "APPERANCEUR"    Imaging: ECHOCARDIOGRAM COMPLETE  Result Date: 06/18/2022    ECHOCARDIOGRAM REPORT   Patient Name:   Gary Mccoy. Date of Exam: 06/18/2022 Medical Rec #:  [id number]             Height:       73.0 in Accession #:    [id number]            Weight:       191.0 lb Date of Birth:  21-Dec-1950             BSA:          2.110 m Patient Age:    72 years              BP:            140/83 mmHg Patient Gender: M                     HR:           91 bpm. Exam Location:  [facility] Procedure: 2D Echo, Cardiac Doppler, Color Doppler and Intracardiac            Opacification Agent Indications:     CHF  History:         Patient has prior history of Echocardiogram examinations. CHF                  and Cardiomyopathy, CAD and Angina, Stroke and PAD,                  Signs/Symptoms:Shortness of Breath; Risk Factors:Hypertension,                  Dyslipidemia and Current Smoker. Patient was unable to lie down                  due to Shortness of breath. Study completed while patient was                  sitting up in bed.  Sonographer:     Wenda Low Referring Phys:  [id number] Athena Masse Diagnosing Phys: Isaias Cowman MD  Sonographer Comments: Technically difficult study due to poor echo windows. Image acquisition challenging due to respiratory motion. IMPRESSIONS  1. Left ventricular ejection fraction, by estimation, is 55 to 60%. The left ventricle has normal function. The left ventricle has no regional wall motion abnormalities. Left ventricular diastolic parameters were normal.  2. Right ventricular systolic function is normal. The right ventricular size is normal. There is normal pulmonary artery systolic pressure.  3. The mitral valve is normal in structure. Mild mitral valve regurgitation. No evidence of mitral stenosis.  4. The aortic valve is normal in structure. Aortic valve regurgitation is not visualized. No aortic stenosis is present.  5. The inferior vena cava is normal in size with greater than 50% respiratory variability, suggesting right atrial pressure of 3 mmHg. FINDINGS  Left Ventricle: Left ventricular ejection fraction, by estimation, is 55 to 60%. The left ventricle has normal function. The left ventricle has no regional wall motion abnormalities. Definity contrast agent was given IV to delineate the left ventricular  endocardial borders. The left ventricular  internal cavity size was normal in size. There is no left ventricular hypertrophy. Left ventricular diastolic parameters were normal. Right Ventricle: The right ventricular size is normal. No increase in  right ventricular wall thickness. Right ventricular systolic function is normal. There is normal pulmonary artery systolic pressure. The tricuspid regurgitant velocity is 2.39 m/s, and  with an assumed right atrial pressure of 3 mmHg, the estimated right ventricular systolic pressure is 0000000 mmHg. Left Atrium: Left atrial size was normal in size. Right Atrium: Right atrial size was normal in size. Pericardium: There is no evidence of pericardial effusion. Mitral Valve: The mitral valve is normal in structure. Mild mitral valve regurgitation. No evidence of mitral valve stenosis. MV peak gradient, 4.9 mmHg. The mean mitral valve gradient is 2.0 mmHg. Tricuspid Valve: The tricuspid valve is normal in structure. Tricuspid valve regurgitation is mild . No evidence of tricuspid stenosis. Aortic Valve: The aortic valve is normal in structure. Aortic valve regurgitation is not visualized. No aortic stenosis is present. Aortic valve mean gradient measures 3.0 mmHg. Aortic valve peak gradient measures 6.1 mmHg. Aortic valve area, by VTI measures 2.10 cm. Pulmonic Valve: The pulmonic valve was normal in structure. Pulmonic valve regurgitation is not visualized. No evidence of pulmonic stenosis. Aorta: The aortic root is normal in size and structure. Venous: The inferior vena cava is normal in size with greater than 50% respiratory variability, suggesting right atrial pressure of 3 mmHg. IAS/Shunts: No atrial level shunt detected by color flow Doppler.  LEFT VENTRICLE PLAX 2D LVIDd:         5.40 cm   Diastology LVIDs:         3.50 cm   LV e' medial:    4.90 cm/s LV PW:         1.00 cm   LV E/e' medial:  22.9 LV IVS:        1.00 cm   LV e' lateral:   8.81 cm/s LVOT diam:     1.90 cm   LV E/e' lateral: 12.7 LV SV:         55 LV  SV Index:   26 LVOT Area:     2.84 cm  RIGHT VENTRICLE RV Basal diam:  3.60 cm RV Mid diam:    3.40 cm RV S prime:     11.20 cm/s TAPSE (M-mode): 3.2 cm LEFT ATRIUM             Index        RIGHT ATRIUM           Index LA diam:        4.40 cm 2.09 cm/m   RA Area:     20.90 cm LA Vol (A2C):   68.9 ml 32.65 ml/m  RA Volume:   63.00 ml  29.86 ml/m LA Vol (A4C):   47.3 ml 22.42 ml/m LA Biplane Vol: 57.8 ml 27.39 ml/m  AORTIC VALVE                    PULMONIC VALVE AV Area (Vmax):    2.35 cm     PV Vmax:       0.95 m/s AV Area (Vmean):   2.10 cm     PV Peak grad:  3.6 mmHg AV Area (VTI):     2.10 cm AV Vmax:           123.00 cm/s AV Vmean:          84.000 cm/s AV VTI:            0.263 m AV Peak Grad:      6.1 mmHg AV Mean Grad:  3.0 mmHg LVOT Vmax:         102.00 cm/s LVOT Vmean:        62.100 cm/s LVOT VTI:          0.195 m LVOT/AV VTI ratio: 0.74  AORTA Ao Root diam: 3.60 cm MITRAL VALVE                TRICUSPID VALVE MV Area (PHT): 5.42 cm     TR Peak grad:   22.8 mmHg MV Area VTI:   1.86 cm     TR Vmax:        239.00 cm/s MV Peak grad:  4.9 mmHg MV Mean grad:  2.0 mmHg     SHUNTS MV Vmax:       1.11 m/s     Systemic VTI:  0.20 m MV Vmean:      70.1 cm/s    Systemic Diam: 1.90 cm MV Decel Time: 140 msec MV E velocity: 112.00 cm/s MV A velocity: 68.70 cm/s MV E/A ratio:  1.63 Isaias Cowman MD Electronically signed by Isaias Cowman MD Signature Date/Time: 06/18/2022/5:00:52 PM    Final      Medications:      amLODipine  5 mg Oral Daily   apixaban  2.5 mg Oral BID   Chlorhexidine Gluconate Cloth  6 each Topical Daily   clopidogrel  75 mg Oral Daily   cyanocobalamin  1,000 mcg Oral Daily   fluticasone  1 spray Each Nare Daily   furosemide  40 mg Oral Daily   hydrALAZINE  100 mg Oral TID   isosorbide dinitrate  40 mg Oral TID   metoprolol succinate  25 mg Oral Daily   multivitamin with minerals  1 tablet Oral Daily   omega-3 acid ethyl esters  1 g Oral Daily   pantoprazole  40  mg Oral Daily   rosuvastatin  20 mg Oral Daily   acetaminophen, albuterol, nitroGLYCERIN, ondansetron (ZOFRAN) IV, sodium chloride  Assessment/ Plan:  Mr. Sarthak Tonn. is a 72 y.o. white male with congestive heart failure, coronary artery disease, gastric ulcer disease, peripheral vascular disease, stroke, COPD who is admitted to [facility] on 06/17/2022 for Acute pulmonary edema [J81.0] CHF exacerbation [I50.9] Acute CHF (congestive heart failure) [I50.9] AKI (acute kidney injury) [N17.9] Acute renal failure superimposed on stage 4 chronic kidney disease, unspecified acute renal failure type [N17.9, N18.4]  Acute Kidney Injury on chronic kidney disease on chronic kidney disease stage IV: baseline creatinine of 2.41, GFR of 28. 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 Acute kidney injury secondary to acute cardiorenal syndrome. No acute indication for dialysis. History of intolerance to olmesartan. Not currently on an SGLT-2 inhibitor.  Renal function slowly improving. UOP acceptable. Will schedule followup appt with our office at discharge.   Acute exacerbation of chronic systolic and diastolic congestive heart failure. Echocardiogram reviewed. Home regimen of furosemide 40mg  daily and metolazone 5mg  twice a week.  - Transitioned to oral Furosemide 40mg  daily .   Anemia with chronic kidney disease: hemoglobin dropped from 12.1 to 9.4. Hgb 10.2 today  Hypertension with chronic kidney disease: placed on NTG gtt on admission. Restarted metoprolol, isosorbide mononitrate and hydralazine.  Blood pressure acceptable for this patient.   Secondary Hyperparathyroidism: labs on 05/28/22: PTH 167, phosphorus and calcium at goal. Not on a vitamin D agent.    LOS: 3   4/4/20241:36 PM

## 2022-06-20 NOTE — Progress Notes (Signed)
SATURATION QUALIFICATIONS: (This note is used to comply with regulatory documentation for home oxygen)  Patient Saturations on Room Air at Rest = 94%  Patient Saturations on Room Air while Ambulating = 83%  Patient Saturations on 3 Liters of oxygen while Ambulating = 89%  Please briefly explain why patient needs home oxygen:  Patient is a mouth breather, Saturations go up to 93% when he breathes through nose at 3 litres.

## 2022-06-20 NOTE — Discharge Summary (Signed)
Physician Discharge Summary   Patient: Gary Mccoy. MRN: [id number] DOB: 1950-04-05  Admit date:     06/17/2022  Discharge date: 06/20/22  Discharge Physician: Fritzi Mandes   PCP: Rusty Aus, MD   Recommendations at discharge:   Dr Clayborn Bigness in 1-2 weeks Dr Candiss Norse Nephrology in 1-2 weeks Dr [name] in 1-2 weeks  Discharge Diagnoses: Principal Problem:   Acute on chronic systolic CHF (congestive heart failure) Active Problems:   Ischemic cardiomyopathy   Acute respiratory failure with hypoxia   CAD S/P percutaneous coronary angioplasty   Unstable angina   Hypertensive emergency   PAD s/p aortobifemoral bypass 2017 (peripheral artery disease)   History of CVA and bilateral carotid artery stenosis (cerebrovascular accident)   Chronic anticoagulation   Acute renal failure superimposed on stage 4 chronic kidney disease   Acute CHF (congestive heart failure)   Gary Mccoy. is a 72 y.o. male with medical history significant for CAD s/p stent angioplasty, prior stroke with bilateral carotid artery stenosis,, PAD status post aortobilateral femoral artery bypass in 2017, on Eliquis,, pulmonary hypertension, systolic CHF with improved EF (55 to 60% 10/2021), secondary to ischemic ischemic cardiomyopathy, hypertension, CKD stage IV,   who presents to the ED by EMS with shortness of breath and chest pressure that started about 40 minutes prior to arrival.  He took 2 sublingual nitroglycerin without improvement.  On arrival of EMS, patient appeared pale diaphoretic and had labored breathing with O2 sat 67% and was placed on NRB for transport.  On arrival to the ED he was transitioned to BiPAP.    Acute on chronic diastolic CHF (congestive heart failure) Ischemic cardiomyopathy Acute respiratory failure with hypoxia Hypertensive emergency Patient presents with shortness of breath, chest pain, SBP over 200, O2 sat 67% currently on BiPAP History of systolic CHF with recovered EF of  55 to 60% 10/2021 -recieved IV Lasix, nitroglycerin infusion -Continue metoprolol,, hydralazine - Echocardiogram EF 55-60% -Cardiology consult with Dr Josefa Half- Start Isordil 30 mg 3 times daily once nitroglycerin infusion stopped -Continue hydralazine 100 mg 3 times daily -Continue diuresis with IV Lasix 40 mg twice daily (on 20 mg p.o. Lasix twice daily and metolazone at home -Increase metoprolol XL from 25 mg to 50 mg daily -Consider restarting amlodipine if BP remains elevated despite the above medications --off nitro and heparin gtt. Will resume eliquis (cards aware), hgb stable --on Imdur 40 mg tid --Qualified for home oxygen   Unstable angina CAD s/p PCI History of cardiac cath 03/2021 with stent placement Patient presents with chest pain in the setting of CHF exacerbation, ST depression inferolateral leads, troponin elevation to 168, flat from prior done a few hours earlier at PCPs office of 178 - Continue heparin infusion--now d/ced - weaned off nitro gtt - Continue metoprolol, rosuvastatin - Echocardiogram  showed EF 55-60%   PAD s/p aortobifemoral bypass 2017 (peripheral artery disease) History of CVA and bilateral carotid artery stenosis Chronic anticoagulation Resumed eliquis Continue rosuvastatin   Acute renal failure superimposed on stage 4 chronic kidney disease Cardiorenal-syndrome Renal function slightly lower than baseline, creatinine for baseline 3.5 Avoid nephrotoxins Seen by Dr Juleen China --creat down to 2.5 --pt advised to take Metolazone 5 mg  2 times /week  and daily lasix. --f/u Dr Candiss Norse   okay from cardiology and nephrology standpoint for discharge. Patient will discharge with home oxygen. Discharge plan discussed with patient's wife.   Procedures:none Family communication :none today Consults : cardiology, nephrology CODE STATUS: full DVT  Prophylaxis :eliquis      Disposition: Home Diet recommendation:  Discharge Diet Orders (From admission,  onward)     Start     Ordered   06/20/22 0000  Diet - low sodium heart healthy        06/20/22 1401           Cardiac diet DISCHARGE MEDICATION: Allergies as of 06/20/2022   No Known Allergies      Medication List     STOP taking these medications    benzonatate 200 MG capsule Commonly known as: TESSALON   isosorbide mononitrate 120 MG 24 hr tablet Commonly known as: IMDUR       TAKE these medications    albuterol 108 (90 Base) MCG/ACT inhaler Commonly known as: VENTOLIN HFA Inhale 2 puffs into the lungs every 6 (six) hours as needed for wheezing or shortness of breath.   amLODipine 5 MG tablet Commonly known as: NORVASC Take 5 mg by mouth 2 (two) times daily.   apixaban 2.5 MG Tabs tablet Commonly known as: ELIQUIS Take 2.5 mg by mouth 2 (two) times daily.   clopidogrel 75 MG tablet Commonly known as: PLAVIX Take 75 mg by mouth daily.   CoQ10 200 MG Caps Take 200 mg by mouth daily.   cyanocobalamin 1000 MCG tablet Commonly known as: VITAMIN B12 Take 1,000 mcg by mouth daily.   FISH OIL PO Take by mouth.   fluticasone 50 MCG/ACT nasal spray Commonly known as: FLONASE Place 1 spray into both nostrils 2 (two) times daily.   furosemide 40 MG tablet Commonly known as: LASIX Take 1 tablet (40 mg total) by mouth daily. for weight gain or swelling   hydrALAZINE 100 MG tablet Commonly known as: APRESOLINE Take 100 mg by mouth 3 (three) times daily.   isosorbide dinitrate 40 MG tablet Commonly known as: ISORDIL Take 1 tablet (40 mg total) by mouth 3 (three) times daily.   metolazone 5 MG tablet Commonly known as: ZAROXOLYN Take 5 mg by mouth 2 (two) times a week.   metoprolol succinate 25 MG 24 hr tablet Commonly known as: TOPROL-XL Take 1 tablet (25 mg total) by mouth daily.   MULTIVITAMIN ADULT PO Take 1 tablet by mouth daily. Men   nitroGLYCERIN 0.4 MG SL tablet Commonly known as: NITROSTAT Place 1 tablet (0.4 mg total) under the tongue  every 5 (five) minutes x 3 doses as needed for chest pain.   pantoprazole 40 MG tablet Commonly known as: PROTONIX Take 40 mg by mouth daily.   rosuvastatin 40 MG tablet Commonly known as: CRESTOR Take 20 mg by mouth daily.   Vitron-C 65-125 MG Tabs Generic drug: Iron-Vitamin C Take 1 tablet by mouth daily.               Durable Medical Equipment  (From admission, onward)           Start     Ordered   06/20/22 1238  For home use only DME oxygen  Once       Question Answer Comment  Length of Need Lifetime   Mode or (Route) Nasal cannula   Liters per Minute 2   Frequency Continuous (stationary and portable oxygen unit needed)   Oxygen conserving device Yes   Oxygen delivery system Gas      06/20/22 1237            Follow-up Information     Callwood, Karma Greaser D, MD. Go in 1 week(s).   Specialties:  Cardiology, Internal Medicine Why: Appointment on Thursday, 06/27/2022 at 11:15am. Contact information: [place] [address] [phone]         Rusty Aus, MD. Go in 1 week(s).   Specialty: Internal Medicine Why: Appointment on Monday, 06/24/2022 at 10:00am. Contact information: [place] [address] [id number]         Murlean Iba, MD. Schedule an appointment as soon as possible for a visit in 10 day(s).   Specialty: Nephrology Why: f/u CKD IV Contact information: [place]  [address] [phone]                Discharge Exam: Danley Danker Weights   06/19/22 0803 06/19/22 1917 06/20/22 0121  Weight: 83.8 kg 81.8 kg 81.3 kg     Condition at discharge: fair  The results of significant diagnostics from this hospitalization (including imaging, microbiology, ancillary and laboratory) are listed below for reference.   Imaging Studies: ECHOCARDIOGRAM COMPLETE  Result Date: 06/18/2022    ECHOCARDIOGRAM REPORT   Patient Name:    Terran A Phil Mccoy. Date of Exam: 06/18/2022 Medical Rec #:  [id number]             Height:       73.0 in Accession #:    [id number]            Weight:       191.0 lb Date of Birth:  11/08/1950             BSA:          2.110 m Patient Age:    60 years              BP:           140/83 mmHg Patient Gender: M                     HR:           91 bpm. Exam Location:  [facility] Procedure: 2D Echo, Cardiac Doppler, Color Doppler and Intracardiac            Opacification Agent Indications:     CHF  History:         Patient has prior history of Echocardiogram examinations. CHF                  and Cardiomyopathy, CAD and Angina, Stroke and PAD,                  Signs/Symptoms:Shortness of Breath; Risk Factors:Hypertension,                  Dyslipidemia and Current Smoker. Patient was unable to lie down                  due to Shortness of breath. Study completed while patient was                  sitting up in bed.  Sonographer:     Wenda Low Referring Phys:  [id number] Athena Masse Diagnosing Phys: Isaias Cowman MD  Sonographer Comments: Technically difficult study due to poor echo windows. Image acquisition challenging due to respiratory motion. IMPRESSIONS  1. Left ventricular ejection fraction, by estimation, is 55 to 60%. The left ventricle has normal function. The left ventricle has no regional wall motion abnormalities. Left ventricular diastolic parameters were normal.  2. Right ventricular systolic function is normal.  The right ventricular size is normal. There is normal pulmonary artery systolic pressure.  3. The mitral valve is normal in structure. Mild mitral valve regurgitation. No evidence of mitral stenosis.  4. The aortic valve is normal in structure. Aortic valve regurgitation is not visualized. No aortic stenosis is present.  5. The inferior vena cava is normal in size with greater than 50% respiratory variability, suggesting right atrial pressure of 3 mmHg. FINDINGS  Left Ventricle: Left  ventricular ejection fraction, by estimation, is 55 to 60%. The left ventricle has normal function. The left ventricle has no regional wall motion abnormalities. Definity contrast agent was given IV to delineate the left ventricular  endocardial borders. The left ventricular internal cavity size was normal in size. There is no left ventricular hypertrophy. Left ventricular diastolic parameters were normal. Right Ventricle: The right ventricular size is normal. No increase in right ventricular wall thickness. Right ventricular systolic function is normal. There is normal pulmonary artery systolic pressure. The tricuspid regurgitant velocity is 2.39 m/s, and  with an assumed right atrial pressure of 3 mmHg, the estimated right ventricular systolic pressure is 0000000 mmHg. Left Atrium: Left atrial size was normal in size. Right Atrium: Right atrial size was normal in size. Pericardium: There is no evidence of pericardial effusion. Mitral Valve: The mitral valve is normal in structure. Mild mitral valve regurgitation. No evidence of mitral valve stenosis. MV peak gradient, 4.9 mmHg. The mean mitral valve gradient is 2.0 mmHg. Tricuspid Valve: The tricuspid valve is normal in structure. Tricuspid valve regurgitation is mild . No evidence of tricuspid stenosis. Aortic Valve: The aortic valve is normal in structure. Aortic valve regurgitation is not visualized. No aortic stenosis is present. Aortic valve mean gradient measures 3.0 mmHg. Aortic valve peak gradient measures 6.1 mmHg. Aortic valve area, by VTI measures 2.10 cm. Pulmonic Valve: The pulmonic valve was normal in structure. Pulmonic valve regurgitation is not visualized. No evidence of pulmonic stenosis. Aorta: The aortic root is normal in size and structure. Venous: The inferior vena cava is normal in size with greater than 50% respiratory variability, suggesting right atrial pressure of 3 mmHg. IAS/Shunts: No atrial level shunt detected by color flow Doppler.   LEFT VENTRICLE PLAX 2D LVIDd:         5.40 cm   Diastology LVIDs:         3.50 cm   LV e' medial:    4.90 cm/s LV PW:         1.00 cm   LV E/e' medial:  22.9 LV IVS:        1.00 cm   LV e' lateral:   8.81 cm/s LVOT diam:     1.90 cm   LV E/e' lateral: 12.7 LV SV:         55 LV SV Index:   26 LVOT Area:     2.84 cm  RIGHT VENTRICLE RV Basal diam:  3.60 cm RV Mid diam:    3.40 cm RV S prime:     11.20 cm/s TAPSE (M-mode): 3.2 cm LEFT ATRIUM             Index        RIGHT ATRIUM           Index LA diam:        4.40 cm 2.09 cm/m   RA Area:     20.90 cm LA Vol (A2C):   68.9 ml 32.65 ml/m  RA Volume:   63.00 ml  29.86 ml/m LA Vol (A4C):   47.3 ml 22.42 ml/m LA Biplane Vol: 57.8 ml 27.39 ml/m  AORTIC VALVE                    PULMONIC VALVE AV Area (Vmax):    2.35 cm     PV Vmax:       0.95 m/s AV Area (Vmean):   2.10 cm     PV Peak grad:  3.6 mmHg AV Area (VTI):     2.10 cm AV Vmax:           123.00 cm/s AV Vmean:          84.000 cm/s AV VTI:            0.263 m AV Peak Grad:      6.1 mmHg AV Mean Grad:      3.0 mmHg LVOT Vmax:         102.00 cm/s LVOT Vmean:        62.100 cm/s LVOT VTI:          0.195 m LVOT/AV VTI ratio: 0.74  AORTA Ao Root diam: 3.60 cm MITRAL VALVE                TRICUSPID VALVE MV Area (PHT): 5.42 cm     TR Peak grad:   22.8 mmHg MV Area VTI:   1.86 cm     TR Vmax:        239.00 cm/s MV Peak grad:  4.9 mmHg MV Mean grad:  2.0 mmHg     SHUNTS MV Vmax:       1.11 m/s     Systemic VTI:  0.20 m MV Vmean:      70.1 cm/s    Systemic Diam: 1.90 cm MV Decel Time: 140 msec MV E velocity: 112.00 cm/s MV A velocity: 68.70 cm/s MV E/A ratio:  1.63 Isaias Cowman MD Electronically signed by Isaias Cowman MD Signature Date/Time: 06/18/2022/5:00:52 PM    Final    DG Chest Port 1 View  Result Date: 06/17/2022 CLINICAL DATA:  Shortness of breath EXAM: PORTABLE CHEST 1 VIEW COMPARISON:  10/29/2021 FINDINGS: Transverse diameter of heart is within normal limits. Central pulmonary vessels are more  prominent. Increased interstitial markings are seen in both lungs, more so in right lower lung field. There is blunting of right lateral CP angle. There is no pneumothorax. IMPRESSION: Central pulmonary vessels are more prominent suggesting CHF. Increased interstitial markings are seen in both lungs, more so in the right lower lung field. This may suggest pulmonary edema or multifocal pneumonia. Small right pleural effusion. Electronically Signed   By: Elmer Picker M.D.   On: 06/17/2022 17:48    Microbiology: Results for orders placed or performed during the hospital encounter of 06/17/22  MRSA Next Gen by PCR, Nasal     Status: None   Collection Time: 06/18/22 11:14 PM   Specimen: Nasal Mucosa; Nasal Swab  Result Value Ref Range Status   MRSA by PCR Next Gen NOT DETECTED NOT DETECTED Final    Comment: (NOTE) The GeneXpert MRSA Assay (FDA approved for NASAL specimens only), is one component of a comprehensive MRSA colonization surveillance program. It is not intended to diagnose MRSA infection nor to guide or monitor treatment for MRSA infections. Test performance is not FDA approved in patients less than 3 years old. Performed at [facility], [address], [place], [place] [address]     Labs: CBC: Recent  Labs  Lab 06/17/22 1724 06/18/22 0534 06/19/22 1213 06/20/22 0541  WBC 12.6* 6.9 7.5 6.4  HGB 12.1* 9.4* 10.3* 10.2*  HCT 37.0* 29.0* 31.0* 31.7*  MCV 85.3 83.8 82.7 84.3  PLT 237 172 187 [id number]   Basic Metabolic Panel: Recent Labs  Lab 06/17/22 1724 06/18/22 0534 06/19/22 1213 06/20/22 0541  NA 137 137 135 138  K 3.8 3.5 3.5 3.5  CL 103 103 104 105  CO2 19* 20* 22 23  GLUCOSE 161* 111* 100* 98  BUN 60* 62* 62* 53*  CREATININE 4.04* 3.53* 2.88* 2.57*  CALCIUM 8.9 8.4* 8.6* 8.9   CBG: Recent Labs  Lab 06/18/22 2311  GLUCAP 92    Discharge time spent: greater than 30 minutes.  Signed: Fritzi Mandes, MD [facility] 06/20/2022

## 2022-06-24 NOTE — Progress Notes (Deleted)
Patient ID: Gary Ferry., male    DOB: [birth year], 72 y.o.   MRN: [id number]  Primary cardiologist: PCP:  HPI  Gary Mccoy is a 72 y/o male with a history of stroke, hyperlipidemia, GERD, CAD, PAD, CKD, tobacco use and chronic heart failure.   Echo 06/18/22: EF 55-60% along with mild Gary.  Echo 10/30/21: EF of 55-60% along with mild Gary  RHC/LHC done 03/28/21 and showed:   Mid LM to Dist LM lesion is 50% stenosed.   Ost RCA lesion is 70% stenosed.   Prox RCA-1 lesion is 80% stenosed.   Prox RCA-2 lesion is 80% stenosed.   Dist LAD-2 lesion is 50% stenosed.   Dist LAD-1 lesion is 50% stenosed.   1st Diag lesion is 70% stenosed.   Ost Cx lesion is 50% stenosed.   The left ventricular systolic function is normal.   LV end diastolic pressure is normal.   The left ventricular ejection fraction is 55-65% by visual estimate.   Hemodynamic findings consistent with moderate pulmonary hypertension.  Right heart cath right brachial Wedge 29/36 mean of 24 PA 53/24 mean of 38 RV 51/9 end-diastolic of 16 RA 17/15 mean of 14 LV 154/12 end-diastolic of 28 Ao 140/67 Ao sat 96% PA sat 70% Cardiac output Fick 6.38 Index 3.13 Measurement suggests moderate pulmonary hypertension  Admitted 06/17/22 due to SOB/ chest pain due to acute on chronic heart failure. Initially needed bipap. IV diuresed. Metolazone twice weekly.   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    Past Medical History:  Diagnosis Date   Arthritis    lower back   Back pain    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    CHF (congestive heart failure)    Chronic kidney disease    Complication of anesthesia    Violent with hallucinations after procedure 10/2015   Coronary artery disease    DDD (degenerative disc disease), lumbar    Duodenal ulcer    Elevated lipids    GERD  (gastroesophageal reflux disease)    Leg weakness, bilateral    s/p lumbar surgery   PAD (peripheral artery disease)    Polyradiculitis    Stroke    Rt lower leg  estimated 10 - 12 stokes in last 5 years   Wears dentures    full upper and lower   Past Surgical History:  Procedure Laterality Date   AORTA - BILATERAL FEMORAL ARTERY BYPASS GRAFT N/A 11/08/2015   Procedure: AORTA BIFEMORAL BYPASS GRAFT;  Surgeon: Renford Dills, MD;  Location: [facility] ORS;  Service: Vascular;  Laterality: N/A;   BACK SURGERY  2005   CATARACT EXTRACTION W/PHACO Right 02/20/2022   Procedure: CATARACT EXTRACTION PHACO AND INTRAOCULAR LENS PLACEMENT (IOC) RIGHT;  Surgeon: Lockie Mola, MD;  Location: [facility];  Service: Ophthalmology;  Laterality: Right;  5.77 0:52.3   CATARACT EXTRACTION W/PHACO Left 03/06/2022   Procedure: CATARACT EXTRACTION PHACO AND INTRAOCULAR LENS PLACEMENT (IOC) LEFT;  Surgeon: Lockie Mola, MD;  Location: [facility];  Service: Ophthalmology;  Laterality: Left;  5.77 1:07.5   CERVICAL DISC SURGERY  1987   COLONOSCOPY WITH PROPOFOL N/A 12/30/2017   Procedure: COLONOSCOPY WITH PROPOFOL;  Surgeon: Toledo, Boykin Nearing, MD;  Location: [facility] ENDOSCOPY;  Service: Gastroenterology;  Laterality: N/A;   COLONOSCOPY WITH PROPOFOL N/A  02/02/2021   Procedure: COLONOSCOPY WITH PROPOFOL;  Surgeon: Regis BillLocklear, Cameron T, MD;  [facility];  Service: Endoscopy;  Laterality: N/A;  Melena   ENDARTERECTOMY FEMORAL Bilateral 11/08/2015   Procedure: ENDARTERECTOMY FEMORAL;  Surgeon: Renford DillsGregory G Schnier, MD;  Location: [facility];  Service: Vascular;  Laterality: Bilateral;  common, SFA, and profundis  Aortic endarterectomy    ESOPHAGOGASTRODUODENOSCOPY (EGD) WITH PROPOFOL N/A 09/04/2015   Procedure: ESOPHAGOGASTRODUODENOSCOPY (EGD) WITH PROPOFOL;  Surgeon: Midge Miniumarren Wohl, MD;  [facility];  Service: Endoscopy;  Laterality: N/A;   ESOPHAGOGASTRODUODENOSCOPY  (EGD) WITH PROPOFOL N/A 02/02/2021   Procedure: ESOPHAGOGASTRODUODENOSCOPY (EGD) WITH PROPOFOL;  Surgeon: Regis BillLocklear, Cameron T, MD;  Location: [facility] END[facility];  Service: Endoscopy;  Laterality: N/A;  Melena   LUMBAR LAMINECTOMY  2006   RIGHT/LEFT HEART CATH AND CORONARY ANGIOGRAPHY Bilateral 03/28/2021   Procedure: RIGHT/LEFT HEART CATH AND CORONARY ANGIOGRAPHY;  Surgeon: Alwyn Peaallwood, Dwayne D, MD;  Location: [facility];  Service: Cardiovascular;  Laterality: Bilateral;   SEPTOPLASTY N/A 09/28/2020   Procedure: SEPTOPLASTY;  Surgeon: Vernie MurdersJuengel, Paul, MD;  [facility];  Service: ENT;  Laterality: N/A;   TEE WITHOUT CARDIOVERSION N/A 09/22/2015   Procedure: TRANSESOPHAGEAL ECHOCARDIOGRAM (TEE);  Surgeon: Dalia HeadingKenneth A Fath, MD;  Location: [facility];  Service: Cardiovascular;  Laterality: N/A;   TURBINATE REDUCTION Bilateral 09/28/2020   Procedure: TURBINATE REDUCTION;  Surgeon: Vernie MurdersJuengel, Paul, MD;  [facility];  Service: ENT;  Laterality: Bilateral;   Family History  Problem Relation Age of Onset   Heart attack Mother    Hypertension Mother    Varicose Veins Mother    Diabetes Father    Heart attack Father    Heart attack Maternal Grandmother    Stroke Maternal Grandmother    Social History   Tobacco Use   Smoking status: Every Day    Packs/day: 1.00    Years: 45.00    Additional pack years: 0.00    Total pack years: 45.00    Types: Cigarettes   Smokeless tobacco: Never   Tobacco comments:    05/17/21- currently 0.5 PPD  Substance Use Topics   Alcohol use: Yes    Comment: rare 3 a year   No Known Allergies  Review of Systems  Constitutional:  Positive for fatigue. Negative for appetite change.  HENT:  Negative for congestion, postnasal drip and sore throat.   Eyes: Negative.   Respiratory:  Positive for shortness of breath. Negative for chest tightness.   Cardiovascular:  Negative for chest pain, palpitations and leg swelling.  Gastrointestinal:   Negative for abdominal distention and abdominal pain.  Endocrine: Negative.   Genitourinary: Negative.   Musculoskeletal:  Positive for back pain. Negative for neck pain.  Skin: Negative.   Allergic/Immunologic: Negative.   Neurological:  Negative for dizziness, light-headedness and headaches.  Hematological:  Negative for adenopathy. Does not bruise/bleed easily.  Psychiatric/Behavioral:  Positive for sleep disturbance (chronic difficulty). Negative for dysphoric mood and suicidal ideas. The patient is nervous/anxious.       Physical Exam Vitals and nursing note reviewed.  Constitutional:      Appearance: Normal appearance.  HENT:     Head: Normocephalic and atraumatic.  Cardiovascular:     Rate and Rhythm: Normal rate and regular rhythm.  Pulmonary:     Effort: Pulmonary effort is normal. No respiratory distress.     Breath sounds: Rhonchi (clears with coughing) present. No wheezing or rales.  Abdominal:     General: There is no distension.  Palpations: Abdomen is soft.     Tenderness: There is no abdominal tenderness.  Musculoskeletal:        General: No tenderness.     Cervical back: Normal range of motion and neck supple.     Right lower leg: No edema.     Left lower leg: No edema.  Skin:    General: Skin is warm and dry.  Neurological:     General: No focal deficit present.     Mental Status: Gary Mccoy is alert and oriented to person, place, and time.  Psychiatric:        Mood and Affect: Mood is anxious. Affect is flat.        Behavior: Behavior normal.        Thought Content: Thought content normal.   Assessment & Plan:  1: Chronic heart failure with preserved ejection fraction- - NYHA class III - euvolemic today - weighing daily and says that Gary Mccoy home weight stays ~ 175 pounds - weight 181.6 from last visit here 6 months ago -  - not adding salt to Gary Mccoy food and is reading food labels for sodium content - BNP 06/17/22 was 1440.5  2: HTN with CKD- - BP  - saw  PCP (MIller) 06/24/22 - saw nephrology Thedore Mins) 06/06/22 - BMP 06/20/22 reviewed and showed sodium 138, potassium 3.5, creatinine 2.57 and GFR 26  3: Atherosclerosis- - saw cardiology Juliann Pares) 04/09/22 - currently taking xarelto & rosuvastatin  4: Tobacco use- - smokes 1 ppd of cigarettes  - complete cessation discussed

## 2022-06-25 ENCOUNTER — Encounter: Payer: Medicare PPO | Admitting: Family

## 2022-06-28 ENCOUNTER — Encounter: Payer: Medicare PPO | Admitting: Family

## 2022-09-20 ENCOUNTER — Ambulatory Visit
Admission: RE | Admit: 2022-09-20 | Discharge: 2022-09-20 | Disposition: A | Discharge status: Home / Self Care | Payer: Medicare PPO | Source: Ambulatory Visit | Attending: Family | Admitting: Family

## 2022-09-20 ENCOUNTER — Telehealth: Payer: Self-pay

## 2022-09-20 ENCOUNTER — Other Ambulatory Visit: Payer: Self-pay | Admitting: Family

## 2022-09-20 DIAGNOSIS — I5033 Acute on chronic diastolic (congestive) heart failure: Secondary | ICD-10-CM | POA: Diagnosis present

## 2022-09-20 LAB — BASIC METABOLIC PANEL
Anion gap: 13 (ref 5–15)
BUN: 54 mg/dL — ABNORMAL HIGH (ref 8–23)
CO2: 25 mmol/L (ref 22–32)
Calcium: 8.2 mg/dL — ABNORMAL LOW (ref 8.9–10.3)
Chloride: 98 mmol/L (ref 98–111)
Creatinine, Ser: 3.22 mg/dL — ABNORMAL HIGH (ref 0.61–1.24)
GFR, Estimated: 20 mL/min — ABNORMAL LOW (ref >60)
Glucose, Bld: 106 mg/dL — ABNORMAL HIGH (ref 70–99)
Potassium: 2.5 mmol/L — CL (ref 3.5–5.1)
Sodium: 136 mmol/L (ref 135–145)

## 2022-09-20 LAB — BRAIN NATRIURETIC PEPTIDE: B Natriuretic Peptide: 762 pg/mL — ABNORMAL HIGH (ref 0.0–100.0)

## 2022-09-20 MED ORDER — FUROSEMIDE 10 MG/ML IJ SOLN
80.0000 mg | Freq: Once | INTRAMUSCULAR | Status: AC
  Administered 2022-09-20: 80 mg via INTRAVENOUS

## 2022-09-20 MED ORDER — POTASSIUM CHLORIDE CRYS ER 20 MEQ PO TBCR
40.0000 meq | EXTENDED_RELEASE_TABLET | Freq: Once | ORAL | Status: AC
  Administered 2022-09-20: 40 meq via ORAL

## 2022-09-20 NOTE — Telephone Encounter (Signed)
Pt called requesting to have iv lasix.  Same day surgery will see pt today. Pt was schedule next week to see Clarisa Kindred , FNP.

## 2022-09-20 NOTE — Telephone Encounter (Addendum)
Pt verbalized understanding and states he will take 80 meq today as 40 meq then wait a few hours and take another 40 meq tablets. He states his kidney doctor recently increased his potassium to 20 meq tablets twice daily, so he already has prescription at home and will resume taking 40 meq daily starting tomorrow.  He denies any further questions or concerns.    ----- Message from Delma Freeze, FNP sent at 09/20/2022  2:15 PM EDT ----- Potassium is very low. Needs another oral potassium today and then starting tomorrow, take daily. Will recheck labs next week at appointment.

## 2022-09-20 NOTE — Progress Notes (Addendum)
Notified by lab of critical potassium result of 2.5. Clarisa Kindred, NP notified.

## 2022-09-25 ENCOUNTER — Encounter: Payer: Self-pay | Admitting: Family

## 2022-09-25 ENCOUNTER — Ambulatory Visit (HOSPITAL_BASED_OUTPATIENT_CLINIC_OR_DEPARTMENT_OTHER): Payer: Medicare PPO | Admitting: Family

## 2022-09-25 ENCOUNTER — Telehealth: Payer: Self-pay

## 2022-09-25 ENCOUNTER — Other Ambulatory Visit
Admission: RE | Admit: 2022-09-25 | Discharge: 2022-09-25 | Disposition: A | Discharge status: Home / Self Care | Payer: Medicare PPO | Source: Ambulatory Visit | Attending: Family | Admitting: Family

## 2022-09-25 VITALS — BP 138/67 | HR 111 | Wt 173.2 lb

## 2022-09-25 DIAGNOSIS — I10 Essential (primary) hypertension: Secondary | ICD-10-CM | POA: Diagnosis not present

## 2022-09-25 DIAGNOSIS — I5032 Chronic diastolic (congestive) heart failure: Secondary | ICD-10-CM

## 2022-09-25 DIAGNOSIS — I739 Peripheral vascular disease, unspecified: Secondary | ICD-10-CM

## 2022-09-25 DIAGNOSIS — I70213 Atherosclerosis of native arteries of extremities with intermittent claudication, bilateral legs: Secondary | ICD-10-CM

## 2022-09-25 LAB — BASIC METABOLIC PANEL
Anion gap: 16 — ABNORMAL HIGH (ref 5–15)
BUN: 67 mg/dL — ABNORMAL HIGH (ref 8–23)
CO2: 29 mmol/L (ref 22–32)
Calcium: 9 mg/dL (ref 8.9–10.3)
Chloride: 90 mmol/L — ABNORMAL LOW (ref 98–111)
Creatinine, Ser: 3.54 mg/dL — ABNORMAL HIGH (ref 0.61–1.24)
GFR, Estimated: 18 mL/min — ABNORMAL LOW (ref >60)
Glucose, Bld: 92 mg/dL (ref 70–99)
Potassium: 2.8 mmol/L — ABNORMAL LOW (ref 3.5–5.1)
Sodium: 135 mmol/L (ref 135–145)

## 2022-09-25 NOTE — Progress Notes (Signed)
PCP: Bethann Punches, MD (last seen 07/24) Primary Cardiologist: Dorothyann Peng, MD (last seen 04/24) HF provider: Florestine Avers, MD (last seen 04/24)  HPI:  Gary Mccoy is a 72 y/o male with a history of stroke, hyperlipidemia, GERD, CAD s/p stent angioplasty (03/2021), CVA, bilateral carotid artery stenosis, PAD s/p aortobilateral femoral artery bypass in 2017, pulmonary HTN, CKD, tobacco use and chronic heart failure.   Echo 10/30/21: EF  55-60% along with mild Gary.  Echo 06/18/22: EF 55-60% along with mild Gary.   RHC/LHC done 03/28/21:   Mid LM to Dist LM lesion is 50% stenosed.   Ost RCA lesion is 70% stenosed.   Prox RCA-1 lesion is 80% stenosed.   Prox RCA-2 lesion is 80% stenosed.   Dist LAD-2 lesion is 50% stenosed.   Dist LAD-1 lesion is 50% stenosed.   1st Diag lesion is 70% stenosed.   Ost Cx lesion is 50% stenosed.   The left ventricular systolic function is normal.   LV end diastolic pressure is normal.   The left ventricular ejection fraction is 55-65% by visual estimate.   Hemodynamic findings consistent with moderate pulmonary hypertension.  Right heart cath right brachial Wedge 29/36 mean of 24 PA 53/24 mean of 38 RV 51/9 end-diastolic of 16 RA 17/15 mean of 14 LV 154/12 end-diastolic of 28 Ao 140/67 Ao sat 96% PA sat 70% Cardiac output Fick 6.38 Index 3.13 Measurement suggests moderate pulmonary hypertension  Admitted 10/29/21 due to SOB, orthopnea and dry cough. Initially given IV lasix with transition to oral diuretics. Hydralazine increased due to BP. Elevated troponin thought to be due to demand ischemia.   Discharged the next day. Admitted 06/17/22 due to shortness of breath and chest pressure. Bipap in ED. SBP >200. Given IV lasix and NTG infusion. Was on heparin infusion due to elevated troponin/ ST depression.   He presents today for a HF f/u visit with a chief complaint of minimal SOB with moderate exertion. Chronic in nature although much improved from last  week when he called into the office. Has associated fatigue, slight pedal edema and cough along with this. Denies chest pain, palpitations, abdominal distention, dizziness, difficulty sleeping or weight gain.   Received 80mg  IV lasix/ potassium 5 days ago after he called the office for weight gain, worsening edema and worsening SOB. Says that he took the IV lasix 09/20/22 (Friday) and then over the weekend, he's been taking 9 lasix daily (360mg ), 3-4 potassium daily (60-70meq) and 5mg  metolazone twice weekly. He has since decreased his lasix back down to 80-120mg  daily and potassium daily. He took all this extra diuretic on his own because he didn't want to end back up in the hospital.   Saw his PCP yesterday and has started a zpack for bronchitis.  ROS: All systems negative except as listed in HPI, PMH and Problem List.  SH:  Social History   Socioeconomic History   Marital status: Married    Spouse name: Not on file   Number of children: Not on file   Years of education: Not on file   Highest education level: Not on file  Occupational History   Not on file  Tobacco Use   Smoking status: Every Day    Packs/day: 1.00    Years: 45.00    Additional pack years: 0.00    Total pack years: 45.00    Types: Cigarettes   Smokeless tobacco: Never   Tobacco comments:    05/17/21- currently 0.5 PPD  Vaping Use   Vaping Use: Never used  Substance and Sexual Activity   Alcohol use: Yes    Comment: rare 3 a year   Drug use: No   Sexual activity: Not on file  Other Topics Concern   Not on file  Social History Narrative   Not on file   Social Determinants of Health   Financial Resource Strain: Not on file  Food Insecurity: No Food Insecurity (06/18/2022)   Hunger Vital Sign    Worried About Running Out of Food in the Last Year: Never true    Ran Out of Food in the Last Year: Never true  Transportation Needs: No Transportation Needs (06/18/2022)   PRAPARE - Doctor, general practice (Medical): No    Lack of Transportation (Non-Medical): No  Physical Activity: Not on file  Stress: Not on file  Social Connections: Not on file  Intimate Partner Violence: Not At Risk (06/18/2022)   Humiliation, Afraid, Rape, and Kick questionnaire    Fear of Current or Ex-Partner: No    Emotionally Abused: No    Physically Abused: No    Sexually Abused: No    FH:  Family History  Problem Relation Age of Onset   Heart attack Mother    Hypertension Mother    Varicose Veins Mother    Diabetes Father    Heart attack Father    Heart attack Maternal Grandmother    Stroke Maternal Grandmother     Past Medical History:  Diagnosis Date   Arthritis    lower back   Back pain    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    CHF (congestive heart failure) (HCC)    Chronic kidney disease    Complication of anesthesia    Violent with hallucinations after procedure 10/2015   Coronary artery disease    DDD (degenerative disc disease), lumbar    Duodenal ulcer    Elevated lipids    GERD (gastroesophageal reflux disease)    Leg weakness, bilateral    s/p lumbar surgery   PAD (peripheral artery disease) (HCC)    Polyradiculitis    Stroke (HCC)    Rt lower leg  estimated 10 - 12 stokes in last 5 years   Wears dentures    full upper and lower    Current Outpatient Medications  Medication Sig Dispense Refill   albuterol (VENTOLIN HFA) 108 (90 Base) MCG/ACT inhaler Inhale 2 puffs into the lungs every 6 (six) hours as needed for wheezing or shortness of breath. 8 g 3   amLODipine (NORVASC) 5 MG tablet Take 5 mg by mouth 2 (two) times daily.     apixaban (ELIQUIS) 2.5 MG TABS tablet Take 2.5 mg by mouth 2 (two) times daily.     clopidogrel (PLAVIX) 75 MG tablet Take 75 mg by mouth daily.     Coenzyme Q10 (COQ10) 200 MG CAPS Take 200 mg by mouth daily.     fluticasone (FLONASE) 50 MCG/ACT nasal spray Place 1 spray into both nostrils 2 (two) times daily.     furosemide (LASIX) 40 MG  tablet Take 1 tablet (40 mg total) by mouth daily. for weight gain or swelling 30 tablet 2   hydrALAZINE (APRESOLINE) 100 MG tablet Take 100 mg by mouth 3 (three) times daily.     Iron-Vitamin C (VITRON-C) 65-125 MG TABS Take 1 tablet by mouth daily.     isosorbide dinitrate (ISORDIL) 40 MG tablet Take 1 tablet (40 mg total) by mouth 3 (  three) times daily. 90 tablet 1   metolazone (ZAROXOLYN) 5 MG tablet Take 5 mg by mouth 2 (two) times a week.     metoprolol succinate (TOPROL-XL) 25 MG 24 hr tablet Take 1 tablet (25 mg total) by mouth daily. 30 tablet 2   Multiple Vitamins-Minerals (MULTIVITAMIN ADULT PO) Take 1 tablet by mouth daily. Men     nitroGLYCERIN (NITROSTAT) 0.4 MG SL tablet Place 1 tablet (0.4 mg total) under the tongue every 5 (five) minutes x 3 doses as needed for chest pain. 30 tablet 12   Omega-3 Fatty Acids (FISH OIL PO) Take by mouth.     pantoprazole (PROTONIX) 40 MG tablet Take 40 mg by mouth daily.     potassium chloride SA (KLOR-CON M) 20 MEQ tablet Take 20 mEq by mouth 2 (two) times daily.     rosuvastatin (CRESTOR) 40 MG tablet Take 20 mg by mouth daily.     vitamin B-12 (CYANOCOBALAMIN) 1000 MCG tablet Take 1,000 mcg by mouth daily.     No current facility-administered medications for this visit.   Vitals:   09/25/22 1107  BP: 138/67  Pulse: (!) 111  SpO2: 98%  Weight: 173 lb 3.2 oz (78.6 kg)   Wt Readings from Last 3 Encounters:  09/25/22 173 lb 3.2 oz (78.6 kg)  06/20/22 179 lb 3.7 oz (81.3 kg)  04/22/22 183 lb (83 kg)   Lab Results  Component Value Date   CREATININE 3.54 (H) 09/25/2022   CREATININE 3.22 (H) 09/20/2022   CREATININE 2.57 (H) 06/20/2022   PHYSICAL EXAM:  General:  Well appearing. No resp difficulty HEENT: normal Neck: supple. JVP flat. No lymphadenopathy or thryomegaly appreciated. Cor: PMI normal. Regular rhythm. Tachycardia. No rubs, gallops or murmurs. Lungs: rhonchi in bilateral upper lobes Abdomen: soft, nontender, nondistended.  No hepatosplenomegaly. No bruits or masses.  Extremities: no cyanosis, clubbing, rash, trace pitting edema Neuro: alert & oriented x3, cranial nerves grossly intact. Moves all 4 extremities w/o difficulty. Affect pleasant.  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  - Echo 06/18/22: EF 55-60% along with mild Gary.  - continue furosemide 80-120mg  daily/ potassium daily - continue metolazone 5mg  twice weekly - continue metoprolol succinate 25mg  daily - BMP today as he received IV lasix last week and he took 3x amount of furosemide on his own along with extra potasisum - discussed the importance of not taking all this extra diuretic without guidance because of his renal function but he says that he was trying to avoid going to the hospital - not adding salt to his food and is reading food labels for sodium content - saw HF provider Allena Katz) 04/24 - BNP 09/20/22 was 762.0  2: HTN with CKD- - BP 138/67 - saw PCP Hyacinth Meeker) 07/24 - saw nephrology Thedore Mins) 06/24 - BMP 09/20/22 reviewed and showed sodium 136, potassium 2.5, creatinine 3.22 and GFR 20 - BMP today  3: Atherosclerosis- - saw cardiology Juliann Pares) 04/24 - continue rosuvastatin 40mg  daily -RHC/LHC done 03/28/21:   Mid LM to Dist LM lesion is 50% stenosed.   Ost RCA lesion  is 70% stenosed.   Prox RCA-1 lesion is 80% stenosed.   Prox RCA-2 lesion is 80% stenosed.   Dist LAD-2 lesion is 50% stenosed.   Dist LAD-1 lesion is 50% stenosed.   1st Diag lesion is 70% stenosed.   Ost Cx lesion is 50% stenosed.   The left ventricular systolic function is normal.   LV end diastolic pressure is  normal.   The left ventricular ejection fraction is 55-65% by visual estimate.   Hemodynamic findings consistent with moderate pulmonary hypertension.  Right heart cath right brachial Wedge 29/36 mean of 24 PA 53/24 mean of 38 RV 51/9 end-diastolic of 16 RA 17/15 mean of 14 LV 154/12 end-diastolic of 28 Ao 140/67 Ao sat 96% PA sat 70% Cardiac output Fick 6.38 Index 3.13 Measurement suggests moderate pulmonary hypertension  4: PAD- - continue apixaban 2.5mg  BID - aortobilateral femoral artery bypass in 2017  Since patient is seeing cardiology and HF provider at [facility], will not make a return appointment at this time. Advised him that he could call us at anytime for questions or if he needed to be seen and he was appreciative of this.

## 2022-09-25 NOTE — Telephone Encounter (Signed)
-----   Message from Delma Freeze, [place] sent at 09/25/2022 12:51 PM EDT ----- Potassium is low and kidney function has worsened some. Increase potassium to in the morning and in the evening. Do this for the next 3 days & then decrease it back to twice daily.. Decrease your furosemide to 1-2 tablets daily until kidney function improves. Will need BMP next week.

## 2022-09-25 NOTE — Patient Instructions (Signed)
[facility] to [facility] to get your lab work drawn.  

## 2023-04-15 ENCOUNTER — Other Ambulatory Visit (INDEPENDENT_AMBULATORY_CARE_PROVIDER_SITE_OTHER): Payer: Self-pay | Admitting: Vascular Surgery

## 2023-04-15 DIAGNOSIS — I739 Peripheral vascular disease, unspecified: Secondary | ICD-10-CM

## 2023-04-15 DIAGNOSIS — I6523 Occlusion and stenosis of bilateral carotid arteries: Secondary | ICD-10-CM

## 2023-04-17 NOTE — Progress Notes (Deleted)
 MRN : [id number]  Gary Mccoy. is a 73 y.o. (10-Apr-1950) male who presents with chief complaint of check circulation.  History of Present Illness:   The patient returns to the office for followup and review of the noninvasive studies. There have been no interval changes in lower extremity symptoms. No interval shortening of the patient's claudication distance or development of rest pain symptoms. No new ulcers or wounds have occurred since the last visit.   The patient is also followed for carotid artery stenosis.  The patient denies interval episodes of amaurosis fugax or recent TIA symptoms. There are no new neurological changes noted since since last visit.   There has been a significant change to the patient's overall health care.  He is s/p PCI to right coronary on 04/16/2021.  No further coronary interventions this past year   The patient denies history of DVT, PE or superficial thrombophlebitis. The patient denies recent episodes of angina or shortness of breath.    ABI Rt=0.63 and Lt=0.91  (previous ABI's Rt=0.74 and Lt=1.05 ) Previous duplex ultrasound of the aorta iliac arteries shows the ABF bypass is patent no hemodynamically significant stenosis identified, No change since last study   Carotid duplex shows RICA 40-59% and LICA 40-59% previous occlusion of the left common carotid is identified     No outpatient medications have been marked as taking for the 04/21/23 encounter (Appointment) with Gilda Crease, Latina Craver, MD.    Past Medical History:  Diagnosis Date   Arthritis    lower back   Back pain    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    CHF (congestive heart failure) (HCC)    Chronic kidney disease    Complication of anesthesia    Violent with hallucinations after procedure 10/2015   Coronary artery disease    DDD (degenerative disc disease), lumbar    Duodenal ulcer    Elevated lipids    GERD  (gastroesophageal reflux disease)    Leg weakness, bilateral    s/p lumbar surgery   PAD (peripheral artery disease) (HCC)    Polyradiculitis    Stroke (HCC)    Rt lower leg  estimated 10 - 12 stokes in last 5 years   Wears dentures    full upper and lower    Past Surgical History:  Procedure Laterality Date   AORTA - BILATERAL FEMORAL ARTERY BYPASS GRAFT N/A 11/08/2015   Procedure: AORTA BIFEMORAL BYPASS GRAFT;  Surgeon: Renford Dills, MD;  Location: [facility] ORS;  Service: Vascular;  Laterality: N/A;   BACK SURGERY  2005   CATARACT EXTRACTION W/PHACO Right 02/20/2022   Procedure: CATARACT EXTRACTION PHACO AND INTRAOCULAR LENS PLACEMENT (IOC) RIGHT;  Surgeon: Lockie Mola, MD;  Location: [facility];  Service: Ophthalmology;  Laterality: Right;  5.77 0:52.3   CATARACT EXTRACTION W/PHACO Left 03/06/2022   Procedure: CATARACT EXTRACTION PHACO AND INTRAOCULAR LENS PLACEMENT (IOC) LEFT;  Surgeon: Lockie Mola, MD;  Location: [facility];  Service: Ophthalmology;  Laterality: Left;  5.77 1:07.5   CERVICAL DISC SURGERY  1987   COLONOSCOPY WITH PROPOFOL N/A 12/30/2017  Procedure: COLONOSCOPY WITH PROPOFOL;  Surgeon: Toledo, Boykin Nearing, MD;  Location: [facility] END[facility];  Service: Gastroenterology;  Laterality: N/A;   COLONOSCOPY WITH PROPOFOL N/A 02/02/2021   Procedure: COLONOSCOPY WITH PROPOFOL;  Surgeon: Regis Bill, MD;  Location: [facility];  Service: Endoscopy;  Laterality: N/A;  Melena   ENDARTERECTOMY FEMORAL Bilateral 11/08/2015   Procedure: ENDARTERECTOMY FEMORAL;  Surgeon: Renford Dills, MD;  Location: [facility];  Service: Vascular;  Laterality: Bilateral;  common, SFA, and profundis  Aortic endarterectomy    ESOPHAGOGASTRODUODENOSCOPY (EGD) WITH PROPOFOL N/A 09/04/2015   Procedure: ESOPHAGOGASTRODUODENOSCOPY (EGD) WITH PROPOFOL;  Surgeon: Midge Minium, MD;  Location: [facility];  Service: Endoscopy;  Laterality: N/A;    ESOPHAGOGASTRODUODENOSCOPY (EGD) WITH PROPOFOL N/A 02/02/2021   Procedure: ESOPHAGOGASTRODUODENOSCOPY (EGD) WITH PROPOFOL;  Surgeon: Regis Bill, MD;  Location: [facility];  Service: Endoscopy;  Laterality: N/A;  Melena   LUMBAR LAMINECTOMY  2006   RIGHT/LEFT HEART CATH AND CORONARY ANGIOGRAPHY Bilateral 03/28/2021   Procedure: RIGHT/LEFT HEART CATH AND CORONARY ANGIOGRAPHY;  Surgeon: Alwyn Pea, MD;  Location: [facility];  Service: Cardiovascular;  Laterality: Bilateral;   SEPTOPLASTY N/A 09/28/2020   Procedure: SEPTOPLASTY;  Surgeon: Vernie Murders, MD;  Location: [facility];  Service: ENT;  Laterality: N/A;   TEE WITHOUT CARDIOVERSION N/A 09/22/2015   Procedure: TRANSESOPHAGEAL ECHOCARDIOGRAM (TEE);  Surgeon: Dalia Heading, MD;  Location: [facility];  Service: Cardiovascular;  Laterality: N/A;   TURBINATE REDUCTION Bilateral 09/28/2020   Procedure: TURBINATE REDUCTION;  Surgeon: Vernie Murders, MD;  Location: [facility];  Service: ENT;  Laterality: Bilateral;    Social History Social History   Tobacco Use   Smoking status: Every Day    Current packs/day: 1.00    Average packs/day: 1 pack/day for 45.0 years (45.0 ttl pk-yrs)    Types: Cigarettes   Smokeless tobacco: Never   Tobacco comments:    05/17/21- currently 0.5 PPD  Vaping Use   Vaping status: Never Used  Substance Use Topics   Alcohol use: Yes    Comment: rare 3 a year   Drug use: No    Family History Family History  Problem Relation Age of Onset   Heart attack Mother    Hypertension Mother    Varicose Veins Mother    Diabetes Father    Heart attack Father    Heart attack Maternal Grandmother    Stroke Maternal Grandmother     No Known Allergies   REVIEW OF SYSTEMS (Negative unless checked)  Constitutional: [] Weight loss  [] Fever  [] Chills Cardiac: [] Chest pain   [] Chest pressure   [] Palpitations   [] Shortness of breath when laying flat   [] Shortness of breath with  exertion. Vascular:  [x] Pain in legs with walking   [] Pain in legs at rest  [] History of DVT   [] Phlebitis   [] Swelling in legs   [] Varicose veins   [] Non-healing ulcers Pulmonary:   [] Uses home oxygen   [] Productive cough   [] Hemoptysis   [] Wheeze  [] COPD   [] Asthma Neurologic:  [] Dizziness   [] Seizures   [] History of stroke   [] History of TIA  [] Aphasia   [] Vissual changes   [] Weakness or numbness in arm   [] Weakness or numbness in leg Musculoskeletal:   [] Joint swelling   [] Joint pain   [] Low back pain Hematologic:  [] Easy bruising  [] Easy bleeding   [] Hypercoagulable state   [] Anemic Gastrointestinal:  [] Diarrhea   [] Vomiting  [] Gastroesophageal reflux/heartburn   [] Difficulty swallowing. Genitourinary:  [  x]Chronic kidney disease   [] Difficult urination  [] Frequent urination   [] Blood in urine Skin:  [] Rashes   [] Ulcers  Psychological:  [] History of anxiety   []  History of major depression.  Physical Examination  There were no vitals filed for this visit. There is no height or weight on file to calculate BMI. Gen: WD/WN, NAD Head: /AT, No temporalis wasting.  Ear/Nose/Throat: Hearing grossly intact, nares w/o erythema or drainage Eyes: PER, EOMI, sclera nonicteric.  Neck: Supple, no masses.  No bruit or JVD.  Pulmonary:  Good air movement, no audible wheezing, no use of accessory muscles.  Cardiac: RRR, normal S1, S2, no Murmurs. Vascular:  mild trophic changes, no open wounds Vessel Right Left  Radial Palpable Palpable  PT Not Palpable Not Palpable  DP Not Palpable Not Palpable  Gastrointestinal: soft, non-distended. No guarding/no peritoneal signs.  Musculoskeletal: M/S 5/5 throughout.  No visible deformity.  Neurologic: CN 2-12 intact. Pain and light touch intact in extremities.  Symmetrical.  Speech is fluent. Motor exam as listed above. Psychiatric: Judgment intact, Mood & affect appropriate for pt's clinical situation. Dermatologic: No rashes or ulcers noted.  No changes  consistent with cellulitis.   CBC Lab Results  Component Value Date   WBC 6.4 06/20/2022   HGB 10.2 (L) 06/20/2022   HCT 31.7 (L) 06/20/2022   MCV 84.3 06/20/2022   PLT 191 06/20/2022    BMET    Component Value Date/Time   NA 135 09/25/2022 1209   K 2.8 (L) 09/25/2022 1209   CL 90 (L) 09/25/2022 1209   CO2 29 09/25/2022 1209   GLUCOSE 92 09/25/2022 1209   BUN 67 (H) 09/25/2022 1209   CREATININE 3.54 (H) 09/25/2022 1209   CALCIUM 9.0 09/25/2022 1209   GFRNONAA 18 (L) 09/25/2022 1209   GFRAA 48 (L) 02/26/2018 1359   CrCl cannot be calculated (Patient's most recent lab result is older than the maximum 21 days allowed.).  COAG Lab Results  Component Value Date   INR 1.9 (H) 01/29/2021   INR 2.24 02/26/2018   INR 1.21 11/08/2015    Radiology No results found.   Assessment/Plan There are no diagnoses linked to this encounter.   Gary Dredge, MD  04/17/2023 10:24 AM

## 2023-04-21 ENCOUNTER — Ambulatory Visit (INDEPENDENT_AMBULATORY_CARE_PROVIDER_SITE_OTHER): Payer: Medicare HMO

## 2023-04-21 ENCOUNTER — Ambulatory Visit (INDEPENDENT_AMBULATORY_CARE_PROVIDER_SITE_OTHER): Payer: Medicare PPO | Admitting: Vascular Surgery

## 2023-04-21 DIAGNOSIS — I10 Essential (primary) hypertension: Secondary | ICD-10-CM

## 2023-04-21 DIAGNOSIS — I739 Peripheral vascular disease, unspecified: Secondary | ICD-10-CM

## 2023-04-21 DIAGNOSIS — I6523 Occlusion and stenosis of bilateral carotid arteries: Secondary | ICD-10-CM

## 2023-04-21 DIAGNOSIS — I2510 Atherosclerotic heart disease of native coronary artery without angina pectoris: Secondary | ICD-10-CM

## 2023-04-21 DIAGNOSIS — E785 Hyperlipidemia, unspecified: Secondary | ICD-10-CM

## 2023-07-11 ENCOUNTER — Other Ambulatory Visit: Payer: Self-pay

## 2023-07-11 ENCOUNTER — Emergency Department

## 2023-07-11 ENCOUNTER — Inpatient Hospital Stay
Admission: EM | Admit: 2023-07-11 | Discharge: 2023-07-24 | DRG: 291 | Disposition: A | Discharge status: Home / Self Care | Attending: Internal Medicine | Admitting: Internal Medicine

## 2023-07-11 DIAGNOSIS — N179 Acute kidney failure, unspecified: Secondary | ICD-10-CM | POA: Diagnosis present

## 2023-07-11 DIAGNOSIS — Z7902 Long term (current) use of antithrombotics/antiplatelets: Secondary | ICD-10-CM

## 2023-07-11 DIAGNOSIS — I1310 Hypertensive heart and chronic kidney disease without heart failure, with stage 1 through stage 4 chronic kidney disease, or unspecified chronic kidney disease: Secondary | ICD-10-CM | POA: Diagnosis present

## 2023-07-11 DIAGNOSIS — I5043 Acute on chronic combined systolic (congestive) and diastolic (congestive) heart failure: Secondary | ICD-10-CM | POA: Diagnosis present

## 2023-07-11 DIAGNOSIS — I2510 Atherosclerotic heart disease of native coronary artery without angina pectoris: Secondary | ICD-10-CM

## 2023-07-11 DIAGNOSIS — I10 Essential (primary) hypertension: Secondary | ICD-10-CM | POA: Diagnosis present

## 2023-07-11 DIAGNOSIS — Z8711 Personal history of peptic ulcer disease: Secondary | ICD-10-CM

## 2023-07-11 DIAGNOSIS — M109 Gout, unspecified: Secondary | ICD-10-CM | POA: Diagnosis present

## 2023-07-11 DIAGNOSIS — I5023 Acute on chronic systolic (congestive) heart failure: Secondary | ICD-10-CM | POA: Diagnosis present

## 2023-07-11 DIAGNOSIS — K219 Gastro-esophageal reflux disease without esophagitis: Secondary | ICD-10-CM | POA: Diagnosis present

## 2023-07-11 DIAGNOSIS — Z9842 Cataract extraction status, left eye: Secondary | ICD-10-CM

## 2023-07-11 DIAGNOSIS — N184 Chronic kidney disease, stage 4 (severe): Secondary | ICD-10-CM | POA: Diagnosis present

## 2023-07-11 DIAGNOSIS — J9621 Acute and chronic respiratory failure with hypoxia: Secondary | ICD-10-CM | POA: Diagnosis not present

## 2023-07-11 DIAGNOSIS — Z1152 Encounter for screening for COVID-19: Secondary | ICD-10-CM

## 2023-07-11 DIAGNOSIS — Z6841 Body Mass Index (BMI) 40.0 and over, adult: Secondary | ICD-10-CM

## 2023-07-11 DIAGNOSIS — Z77028 Contact with and (suspected) exposure to other hazardous aromatic compounds: Secondary | ICD-10-CM | POA: Diagnosis present

## 2023-07-11 DIAGNOSIS — Z961 Presence of intraocular lens: Secondary | ICD-10-CM | POA: Diagnosis present

## 2023-07-11 DIAGNOSIS — Z7901 Long term (current) use of anticoagulants: Secondary | ICD-10-CM

## 2023-07-11 DIAGNOSIS — Z823 Family history of stroke: Secondary | ICD-10-CM

## 2023-07-11 DIAGNOSIS — Z9841 Cataract extraction status, right eye: Secondary | ICD-10-CM

## 2023-07-11 DIAGNOSIS — Z8249 Family history of ischemic heart disease and other diseases of the circulatory system: Secondary | ICD-10-CM

## 2023-07-11 DIAGNOSIS — Z9861 Coronary angioplasty status: Secondary | ICD-10-CM

## 2023-07-11 DIAGNOSIS — F17200 Nicotine dependence, unspecified, uncomplicated: Secondary | ICD-10-CM

## 2023-07-11 DIAGNOSIS — Z8673 Personal history of transient ischemic attack (TIA), and cerebral infarction without residual deficits: Secondary | ICD-10-CM

## 2023-07-11 DIAGNOSIS — I132 Hypertensive heart and chronic kidney disease with heart failure and with stage 5 chronic kidney disease, or end stage renal disease: Secondary | ICD-10-CM | POA: Diagnosis not present

## 2023-07-11 DIAGNOSIS — J439 Emphysema, unspecified: Secondary | ICD-10-CM | POA: Diagnosis present

## 2023-07-11 DIAGNOSIS — M51369 Other intervertebral disc degeneration, lumbar region without mention of lumbar back pain or lower extremity pain: Secondary | ICD-10-CM | POA: Diagnosis present

## 2023-07-11 DIAGNOSIS — J441 Chronic obstructive pulmonary disease with (acute) exacerbation: Secondary | ICD-10-CM | POA: Diagnosis not present

## 2023-07-11 DIAGNOSIS — Z833 Family history of diabetes mellitus: Secondary | ICD-10-CM

## 2023-07-11 DIAGNOSIS — N186 End stage renal disease: Secondary | ICD-10-CM | POA: Diagnosis present

## 2023-07-11 DIAGNOSIS — Z79899 Other long term (current) drug therapy: Secondary | ICD-10-CM

## 2023-07-11 DIAGNOSIS — D631 Anemia in chronic kidney disease: Secondary | ICD-10-CM | POA: Diagnosis present

## 2023-07-11 DIAGNOSIS — I5033 Acute on chronic diastolic (congestive) heart failure: Secondary | ICD-10-CM | POA: Diagnosis present

## 2023-07-11 DIAGNOSIS — Z992 Dependence on renal dialysis: Secondary | ICD-10-CM

## 2023-07-11 DIAGNOSIS — E785 Hyperlipidemia, unspecified: Secondary | ICD-10-CM | POA: Diagnosis present

## 2023-07-11 DIAGNOSIS — F17210 Nicotine dependence, cigarettes, uncomplicated: Secondary | ICD-10-CM | POA: Diagnosis present

## 2023-07-11 DIAGNOSIS — I739 Peripheral vascular disease, unspecified: Secondary | ICD-10-CM | POA: Diagnosis present

## 2023-07-11 DIAGNOSIS — N2581 Secondary hyperparathyroidism of renal origin: Secondary | ICD-10-CM | POA: Diagnosis present

## 2023-07-11 LAB — BASIC METABOLIC PANEL WITH GFR
Anion gap: 10 (ref 5–15)
BUN: 87 mg/dL — ABNORMAL HIGH (ref 8–23)
CO2: 23 mmol/L (ref 22–32)
Calcium: 9 mg/dL (ref 8.9–10.3)
Chloride: 104 mmol/L (ref 98–111)
Creatinine, Ser: 3.63 mg/dL — ABNORMAL HIGH (ref 0.61–1.24)
GFR, Estimated: 17 mL/min — ABNORMAL LOW (ref >60)
Glucose, Bld: 109 mg/dL — ABNORMAL HIGH (ref 70–99)
Potassium: 4.6 mmol/L (ref 3.5–5.1)
Sodium: 137 mmol/L (ref 135–145)

## 2023-07-11 LAB — TROPONIN I (HIGH SENSITIVITY)
Troponin I (High Sensitivity): 80 ng/L — ABNORMAL HIGH (ref <18)
Troponin I (High Sensitivity): 72 ng/L — ABNORMAL HIGH (ref <18)

## 2023-07-11 LAB — RESP PANEL BY RT-PCR (RSV, FLU A&B, COVID)  RVPGX2
Influenza A by PCR: NEGATIVE
Influenza B by PCR: NEGATIVE
Resp Syncytial Virus by PCR: NEGATIVE
SARS Coronavirus 2 by RT PCR: NEGATIVE

## 2023-07-11 LAB — CBC
HCT: 42.8 % (ref 39.0–52.0)
Hemoglobin: 14.5 g/dL (ref 13.0–17.0)
MCH: 29.8 pg (ref 26.0–34.0)
MCHC: 33.9 g/dL (ref 30.0–36.0)
MCV: 88.1 fL (ref 80.0–100.0)
Platelets: 209 10*3/uL (ref 150–400)
RBC: 4.86 MIL/uL (ref 4.22–5.81)
RDW: 15.7 % — ABNORMAL HIGH (ref 11.5–15.5)
WBC: 17.1 10*3/uL — ABNORMAL HIGH (ref 4.0–10.5)
nRBC: 0 % (ref 0.0–0.2)

## 2023-07-11 LAB — LACTIC ACID, PLASMA
Lactic Acid, Venous: 1.9 mmol/L (ref 0.5–1.9)
Lactic Acid, Venous: 2.8 mmol/L (ref 0.5–1.9)

## 2023-07-11 LAB — PROTIME-INR
INR: 1.2 (ref 0.8–1.2)
Prothrombin Time: 15.9 s — ABNORMAL HIGH (ref 11.4–15.2)

## 2023-07-11 LAB — BRAIN NATRIURETIC PEPTIDE: B Natriuretic Peptide: 1867.2 pg/mL — ABNORMAL HIGH (ref 0.0–100.0)

## 2023-07-11 MED ORDER — AMLODIPINE BESYLATE 5 MG PO TABS
5.0000 mg | ORAL_TABLET | Freq: Two times a day (BID) | ORAL | Status: DC
  Administered 2023-07-11 – 2023-07-24 (×26): 5 mg via ORAL
  Filled 2023-07-11 (×27): qty 1

## 2023-07-11 MED ORDER — VITAMIN D 25 MCG (1000 UNIT) PO TABS
1000.0000 [IU] | ORAL_TABLET | Freq: Every day | ORAL | Status: DC
  Administered 2023-07-12 – 2023-07-24 (×13): 1000 [IU] via ORAL
  Filled 2023-07-11 (×13): qty 1

## 2023-07-11 MED ORDER — FUROSEMIDE 10 MG/ML IJ SOLN
40.0000 mg | Freq: Two times a day (BID) | INTRAMUSCULAR | Status: DC
  Administered 2023-07-11 – 2023-07-12 (×2): 40 mg via INTRAVENOUS
  Filled 2023-07-11 (×2): qty 4

## 2023-07-11 MED ORDER — HYDRALAZINE HCL 50 MG PO TABS
100.0000 mg | ORAL_TABLET | Freq: Three times a day (TID) | ORAL | Status: DC
  Administered 2023-07-11 – 2023-07-24 (×38): 100 mg via ORAL
  Filled 2023-07-11 (×38): qty 2

## 2023-07-11 MED ORDER — IPRATROPIUM-ALBUTEROL 0.5-2.5 (3) MG/3ML IN SOLN
3.0000 mL | Freq: Four times a day (QID) | RESPIRATORY_TRACT | Status: DC
  Administered 2023-07-11 (×2): 3 mL via RESPIRATORY_TRACT
  Filled 2023-07-11 (×3): qty 3

## 2023-07-11 MED ORDER — ACETAMINOPHEN 325 MG PO TABS
650.0000 mg | ORAL_TABLET | ORAL | Status: DC | PRN
  Administered 2023-07-19 – 2023-07-20 (×5): 650 mg via ORAL
  Filled 2023-07-11 (×5): qty 2

## 2023-07-11 MED ORDER — CARVEDILOL 12.5 MG PO TABS
12.5000 mg | ORAL_TABLET | Freq: Two times a day (BID) | ORAL | Status: DC
  Administered 2023-07-11 – 2023-07-16 (×10): 12.5 mg via ORAL
  Filled 2023-07-11 (×2): qty 1
  Filled 2023-07-11: qty 2
  Filled 2023-07-11 (×7): qty 1

## 2023-07-11 MED ORDER — ORAL CARE MOUTH RINSE: 15.0000 mL | OROMUCOSAL | Status: DC | PRN

## 2023-07-11 MED ORDER — ALBUTEROL SULFATE (2.5 MG/3ML) 0.083% IN NEBU
5.0000 mg | INHALATION_SOLUTION | Freq: Once | RESPIRATORY_TRACT | Status: AC
  Administered 2023-07-11: 5 mg via RESPIRATORY_TRACT
  Filled 2023-07-11: qty 6

## 2023-07-11 MED ORDER — FUROSEMIDE 10 MG/ML IJ SOLN
40.0000 mg | Freq: Once | INTRAMUSCULAR | Status: AC
  Administered 2023-07-11: 40 mg via INTRAVENOUS
  Filled 2023-07-11: qty 4

## 2023-07-11 MED ORDER — IPRATROPIUM-ALBUTEROL 0.5-2.5 (3) MG/3ML IN SOLN: 3.0000 mL | Freq: Four times a day (QID) | RESPIRATORY_TRACT | Status: DC

## 2023-07-11 MED ORDER — SODIUM CHLORIDE 0.9% FLUSH
3.0000 mL | INTRAVENOUS | Status: DC | PRN
  Administered 2023-07-12: 3 mL via INTRAVENOUS

## 2023-07-11 MED ORDER — METOPROLOL SUCCINATE ER 25 MG PO TB24
25.0000 mg | ORAL_TABLET | Freq: Every day | ORAL | Status: DC
  Administered 2023-07-12 – 2023-07-16 (×5): 25 mg via ORAL
  Filled 2023-07-11 (×5): qty 1

## 2023-07-11 MED ORDER — SODIUM CHLORIDE 0.9% FLUSH
3.0000 mL | Freq: Two times a day (BID) | INTRAVENOUS | Status: DC
  Administered 2023-07-11 – 2023-07-24 (×26): 3 mL via INTRAVENOUS

## 2023-07-11 MED ORDER — SODIUM CHLORIDE 0.9 % IV SOLN: 250.0000 mL | INTRAVENOUS | Status: AC | PRN

## 2023-07-11 MED ORDER — IPRATROPIUM-ALBUTEROL 0.5-2.5 (3) MG/3ML IN SOLN
3.0000 mL | Freq: Four times a day (QID) | RESPIRATORY_TRACT | Status: DC
  Filled 2023-07-11: qty 3

## 2023-07-11 MED ORDER — ONDANSETRON HCL 4 MG/2ML IJ SOLN
4.0000 mg | Freq: Four times a day (QID) | INTRAMUSCULAR | Status: DC | PRN
Start: 2023-07-11 — End: 2023-07-24

## 2023-07-11 MED ORDER — CLONIDINE HCL 0.1 MG PO TABS
0.1000 mg | ORAL_TABLET | Freq: Two times a day (BID) | ORAL | Status: DC
  Administered 2023-07-11 – 2023-07-24 (×26): 0.1 mg via ORAL
  Filled 2023-07-11 (×26): qty 1

## 2023-07-11 MED ORDER — APIXABAN 2.5 MG PO TABS
2.5000 mg | ORAL_TABLET | Freq: Two times a day (BID) | ORAL | Status: DC
  Administered 2023-07-11 – 2023-07-24 (×26): 2.5 mg via ORAL
  Filled 2023-07-11 (×27): qty 1

## 2023-07-11 MED ORDER — PANTOPRAZOLE SODIUM 40 MG PO TBEC
40.0000 mg | DELAYED_RELEASE_TABLET | Freq: Every day | ORAL | Status: DC
  Administered 2023-07-12 – 2023-07-24 (×13): 40 mg via ORAL
  Filled 2023-07-11 (×13): qty 1

## 2023-07-11 MED ORDER — ROSUVASTATIN CALCIUM 20 MG PO TABS
20.0000 mg | ORAL_TABLET | Freq: Every day | ORAL | Status: DC
  Administered 2023-07-12 – 2023-07-24 (×13): 20 mg via ORAL
  Filled 2023-07-11 (×4): qty 2
  Filled 2023-07-11: qty 1
  Filled 2023-07-11 (×2): qty 2
  Filled 2023-07-11: qty 1
  Filled 2023-07-11: qty 2
  Filled 2023-07-11: qty 1
  Filled 2023-07-11 (×2): qty 2
  Filled 2023-07-11: qty 1

## 2023-07-11 MED ORDER — VITAMIN B-12 1000 MCG PO TABS
1000.0000 ug | ORAL_TABLET | Freq: Every day | ORAL | Status: DC
  Administered 2023-07-12 – 2023-07-24 (×13): 1000 ug via ORAL
  Filled 2023-07-11 (×13): qty 1

## 2023-07-11 MED ORDER — PREDNISONE 20 MG PO TABS
40.0000 mg | ORAL_TABLET | Freq: Every day | ORAL | Status: DC
  Administered 2023-07-12: 40 mg via ORAL
  Filled 2023-07-11: qty 2

## 2023-07-11 MED ORDER — CLOPIDOGREL BISULFATE 75 MG PO TABS
75.0000 mg | ORAL_TABLET | Freq: Every day | ORAL | Status: DC
  Administered 2023-07-12: 75 mg via ORAL
  Filled 2023-07-11: qty 1

## 2023-07-11 MED ORDER — ISOSORBIDE DINITRATE 20 MG PO TABS
40.0000 mg | ORAL_TABLET | Freq: Three times a day (TID) | ORAL | Status: DC
  Administered 2023-07-12 – 2023-07-24 (×38): 40 mg via ORAL
  Filled 2023-07-11 (×41): qty 2

## 2023-07-11 MED ORDER — ALLOPURINOL 100 MG PO TABS
100.0000 mg | ORAL_TABLET | Freq: Every day | ORAL | Status: DC
  Administered 2023-07-12 – 2023-07-24 (×13): 100 mg via ORAL
  Filled 2023-07-11 (×13): qty 1

## 2023-07-11 NOTE — Assessment & Plan Note (Signed)
-   Resume home regimen

## 2023-07-11 NOTE — ED Notes (Signed)
 Bipap removed by Ace Holder, RN. Trial on [place] at this time.

## 2023-07-11 NOTE — Assessment & Plan Note (Signed)
 Per chart review, patient has a history of CKD stage IV with creatinine ranging between as low as 2.1 and up to 3.5, currently 3.6.  - Monitor renal function with daily BMP while diuresing

## 2023-07-11 NOTE — Assessment & Plan Note (Signed)
 -  Continue home regimen

## 2023-07-11 NOTE — ED Notes (Signed)
 Label maker in room broken. Labs sent down with chart labels. This RN labeled with EID and time.

## 2023-07-11 NOTE — Assessment & Plan Note (Signed)
 Initial concern for COPD exacerbation given shortness of breath and cough.  Suspect this is mostly driven by heart failure, however will continue steroid and breathing treatments.  - Start prednisone  40 mg daily tomorrow - DuoNebs every 6 hours - Hold home bronchodilators

## 2023-07-11 NOTE — ED Triage Notes (Signed)
 Pt arrived via EMS for Resp distress from home. Pt arrived with CPAP on. Pt sts that he has been having SOB for the last four days. Pt is extremely SOB with ambulation per EMS. Pt is resting comfortably on Bipap at this time.

## 2023-07-11 NOTE — Assessment & Plan Note (Signed)
 Patient has a history of HFrEF with EF as low as 25-30% with global hypokinesis in 2022.  Most recent echocardiogram in 2024 demonstrated recovery of EF at 55-60%. Patient with several days of increasing lower extremity swelling and abdominal distention that coincides with his shortness of breath.  BNP elevated.  - Telemetry monitoring - Echocardiogram ordered since it has been ~ 1 year now since last - Start Lasix  40 mg IV twice daily - Daily BMP and magnesium  - Continue home GDMT as blood pressure tolerates - Strict ins and out - Daily weights

## 2023-07-11 NOTE — ED Notes (Signed)
 Bipap removed and pt placed on 3l/min via [place]. Pt doing well, showing no signs of resp distress.

## 2023-07-11 NOTE — Assessment & Plan Note (Signed)
 Patient states that his PCP stopped his Plavix  yesterday without explanation.  I am not able to find a reason in the chart that it was discontinued but I can see that the order was placed.  He has a history of CVA with carotid artery stenosis, as well as extensive PAD.  Given he is not taking aspirin , I suspect he will need continued antiplatelet therapy in addition to Eliquis .  - Restart home Plavix 

## 2023-07-11 NOTE — ED Provider Notes (Signed)
 [facility] Provider Note    Event Date/Time   First MD Initiated Contact with Patient 07/11/23 1041     (approximate)   History   Chief Complaint: Respiratory Distress   HPI  Gary A Heman Que. is a 73 y.o. male with a history of GERD, CHF, CKD, COPD was brought to the ED from respiratory distress at home.  Reports that he has been having shortness of breath gradually worsening for the last 4 days but this morning it was severe, worse with ambulation, associated central chest pain, EMS reports that patient was in respiratory distress and I placed him on CPAP on arrival which helped.  They also gave Solu-Medrol , nitroglycerin , DuoNebs.        Past Medical History:  Diagnosis Date   Arthritis    lower back   Back pain    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    CHF (congestive heart failure) (HCC)    Chronic kidney disease    Complication of anesthesia    Violent with hallucinations after procedure 10/2015   Coronary artery disease    DDD (degenerative disc disease), lumbar    Duodenal ulcer    Elevated lipids    GERD (gastroesophageal reflux disease)    Leg weakness, bilateral    s/p lumbar surgery   PAD (peripheral artery disease) (HCC)    Polyradiculitis    Stroke (HCC)    Rt lower leg  estimated 10 - 12 stokes in last 5 years   Wears dentures    full upper and lower    Current Outpatient Rx   Order #: [id number] Class: Normal   Order #: [id number] Class: Historical Med   Order #: [id number] Class: Historical Med   Order #: [id number] Class: Historical Med   Order #: [id number] Class: Historical Med   Order #: [id number] Class: Historical Med   Order #: [id number] Class: Historical Med   Order #: [id number] Class: Normal   Order #: [id number] Class: Historical Med   Order #: [id number] Class: Historical Med   Order #: [id number] Class: Normal   Order #: [id number] Class: Historical Med   Order #: [id number] Class: Normal   Order #: [id number] Class: Historical Med    Order #: [id number] Class: Print   Order #: [id number] Class: Historical Med   Order #: [id number] Class: Historical Med   Order #: [id number] Class: Historical Med   Order #: [id number] Class: Historical Med   Order #: [id number] Class: Historical Med    Past Surgical History:  Procedure Laterality Date   AORTA - BILATERAL FEMORAL ARTERY BYPASS GRAFT N/A 11/08/2015   Procedure: AORTA BIFEMORAL BYPASS GRAFT;  Surgeon: Jackquelyn Mass, MD;  Location: [facility];  Service: Vascular;  Laterality: N/A;   BACK SURGERY  2005   CATARACT EXTRACTION W/PHACO Right 02/20/2022   Procedure: CATARACT EXTRACTION PHACO AND INTRAOCULAR LENS PLACEMENT (IOC) RIGHT;  Surgeon: Annell Kidney, MD;  Location: [facility];  Service: Ophthalmology;  Laterality: Right;  5.77 0:52.3   CATARACT EXTRACTION W/PHACO Left 03/06/2022   Procedure: CATARACT EXTRACTION PHACO AND INTRAOCULAR LENS PLACEMENT (IOC) LEFT;  Surgeon: Annell Kidney, MD;  Location: [facility];  Service: Ophthalmology;  Laterality: Left;  5.77 1:07.5   CERVICAL DISC SURGERY  1987   COLONOSCOPY WITH PROPOFOL  N/A 12/30/2017   Procedure: COLONOSCOPY WITH PROPOFOL ;  Surgeon: Toledo, Alphonsus Jeans, MD;  Location: [facility];  Service: Gastroenterology;  Laterality: N/A;   COLONOSCOPY WITH PROPOFOL  N/A 02/02/2021   Procedure: COLONOSCOPY WITH PROPOFOL ;  Surgeon: Shane Darling, MD;  Location: [facility];  Service: Endoscopy;  Laterality: N/A;  Melena   ENDARTERECTOMY FEMORAL Bilateral 11/08/2015   Procedure: ENDARTERECTOMY FEMORAL;  Surgeon: Jackquelyn Mass, MD;  Location: [facility];  Service: Vascular;  Laterality: Bilateral;  common, SFA, and profundis  Aortic endarterectomy    ESOPHAGOGASTRODUODENOSCOPY (EGD) WITH PROPOFOL  N/A 09/04/2015   Procedure: ESOPHAGOGASTRODUODENOSCOPY (EGD) WITH PROPOFOL ;  Surgeon: Marnee Sink, MD;  Location: [facility];  Service: Endoscopy;  Laterality: N/A;   ESOPHAGOGASTRODUODENOSCOPY  (EGD) WITH PROPOFOL  N/A 02/02/2021   Procedure: ESOPHAGOGASTRODUODENOSCOPY (EGD) WITH PROPOFOL ;  Surgeon: Shane Darling, MD;  Location: [facility];  Service: Endoscopy;  Laterality: N/A;  Melena   LUMBAR LAMINECTOMY  2006   RIGHT/LEFT HEART CATH AND CORONARY ANGIOGRAPHY Bilateral 03/28/2021   Procedure: RIGHT/LEFT HEART CATH AND CORONARY ANGIOGRAPHY;  Surgeon: Antonette Batters, MD;  Location: [facility];  Service: Cardiovascular;  Laterality: Bilateral;   SEPTOPLASTY N/A 09/28/2020   Procedure: SEPTOPLASTY;  Surgeon: Mellody Sprout, MD;  Location: [facility];  Service: ENT;  Laterality: N/A;   TEE WITHOUT CARDIOVERSION N/A 09/22/2015   Procedure: TRANSESOPHAGEAL ECHOCARDIOGRAM (TEE);  Surgeon: Ronney Cola, MD;  Location: [facility];  Service: Cardiovascular;  Laterality: N/A;   TURBINATE REDUCTION Bilateral 09/28/2020   Procedure: TURBINATE REDUCTION;  Surgeon: Mellody Sprout, MD;  Location: [facility];  Service: ENT;  Laterality: Bilateral;    Physical Exam   Triage Vital Signs: ED Triage Vitals [07/11/23 1039]  Encounter Vitals Group     BP (!) 179/114     Systolic BP Percentile      Diastolic BP Percentile      Pulse Rate 97     Resp (!) 31     Temp 98.8 F (37.1 C)     Temp Source Axillary     SpO2 100 %     Weight 180 lb (81.6 kg)     Height      Head Circumference      Peak Flow      Pain Score 0     Pain Loc      Pain Education      Exclude from Growth Chart     Most recent vital signs: Vitals:   07/11/23 1200 07/11/23 1230  BP: (!) 181/93 (!) 175/94  Pulse: 83 79  Resp: 18 14  Temp:    SpO2: 94% 97%    General: Awake, no distress.  CV:  Good peripheral perfusion.  Regular rate rhythm Resp:  Normal effort.  Decreased air movement and crackles in the left base.  Good air movement in other lung fields.  There is diffuse expiratory wheezing throughout expiratory phase Abd:  No distention.  Soft nontender Other:  No lower  extremity edema   ED Results / Procedures / Treatments   Labs (all labs ordered are listed, but only abnormal results are displayed) Labs Reviewed  BASIC METABOLIC PANEL WITH GFR - Abnormal; Notable for the following components:      Result Value   Glucose, Bld 109 (*)    BUN 87 (*)    Creatinine, Ser 3.63 (*)    GFR, Estimated 17 (*)    All other components within normal limits  CBC - Abnormal; Notable for the following components:   WBC 17.1 (*)    RDW 15.7 (*)    All other components within normal limits  BRAIN NATRIURETIC PEPTIDE - Abnormal; Notable for the following components:   B Natriuretic Peptide 1,867.2 (*)    All other components within normal limits  PROTIME-INR -  Abnormal; Notable for the following components:   Prothrombin Time 15.9 (*)    All other components within normal limits  TROPONIN I (HIGH SENSITIVITY) - Abnormal; Notable for the following components:   Troponin I (High Sensitivity) 80 (*)    All other components within normal limits  TROPONIN I (HIGH SENSITIVITY) - Abnormal; Notable for the following components:   Troponin I (High Sensitivity) 72 (*)    All other components within normal limits  CULTURE, BLOOD (ROUTINE X 2)  CULTURE, BLOOD (ROUTINE X 2)  RESP PANEL BY RT-PCR (RSV, FLU A&B, COVID)  RVPGX2  LACTIC ACID, PLASMA  LACTIC ACID, PLASMA     EKG Interpreted by me Sinus rhythm rate of 97.  Normal axis, normal intervals.  Poor R wave progression.  Normal ST segments and T waves   RADIOLOGY Chest x-ray interpreted by me, no apparent consolidation.  No pneumothorax or pleural effusion.  Radiology report reviewed   PROCEDURES:  .Critical Care  Performed by: Jacquie Maudlin, MD Authorized by: Jacquie Maudlin, MD   Critical care provider statement:    Critical care time (minutes):  35   Critical care time was exclusive of:  Separately billable procedures and treating other patients   Critical care was necessary to treat or prevent  imminent or life-threatening deterioration of the following conditions:  Respiratory failure   Critical care was time spent personally by me on the following activities:  Development of treatment plan with patient or surrogate, discussions with consultants, evaluation of patient's response to treatment, examination of patient, obtaining history from patient or surrogate, ordering and performing treatments and interventions, ordering and review of laboratory studies, ordering and review of radiographic studies, pulse oximetry, re-evaluation of patient's condition and review of old charts   Care discussed with: admitting provider      MEDICATIONS ORDERED IN ED: Medications  albuterol  (PROVENTIL ) (2.5 MG/3ML) 0.083% nebulizer solution 5 mg (5 mg Nebulization Given 07/11/23 1128)  furosemide  (LASIX ) injection 40 mg (40 mg Intravenous Given 07/11/23 1224)     IMPRESSION / MDM / ASSESSMENT AND PLAN / ED COURSE  I reviewed the triage vital signs and the nursing notes.  DDx: COPD exacerbation, pneumonia,Pleural effusion, pulmonary edema, non-STEMI, electrolyte derangement, renal failure  Patient's presentation is most consistent with acute presentation with potential threat to life or bodily function.  Patient presents with symptoms of COPD exacerbation.  Also having chest pain.  Has been able to titrate down to FiO2 of 25% on BiPAP in the ED.  Labs show mild leukocytosis, troponin of 80 is lower than it was 1 year ago, will need to trend.  Will give additional nebs, attempt to titrate to nasal cannula and plan to admit due to severity of symptoms.   ----------------------------------------- 1:44 PM on 07/11/2023 ----------------------------------------- Patient weaned down to 3L [place], no longer needs BiPAP.  Still has wheezing and prolonged expirations, feels short of breath, will need admission for further symptom management of COPD exacerbation.    ----------------------------------------- 1:55  PM on 07/11/2023 ----------------------------------------- Case d/w hospitalist      FINAL CLINICAL IMPRESSION(S) / ED DIAGNOSES   Final diagnoses:  COPD exacerbation (HCC)  Stage 4 chronic kidney disease (HCC)     Rx / DC Orders   ED Discharge Orders     None        Note:  This document was prepared using Dragon voice recognition software and may include unintentional dictation errors.   Jacquie Maudlin, MD 07/11/23 1356

## 2023-07-11 NOTE — H&P (Signed)
 History and Physical    Patient: Gary Mccoy. JXB:[id number] DOB: 03-06-1951 DOA: 07/11/2023 DOS: the patient was seen and examined on 07/11/2023 PCP: Sari Cunning, MD  Patient coming from: Home  Chief Complaint:  Chief Complaint  Patient presents with   Respiratory Distress   HPI: Gary Mccoy. is a 73 y.o. male with medical history significant of HFpEF, CAD, PAD, carotid artery stenosis, COPD, CKD stage IV, hyperlipidemia, CVA, who presents to the ED due to shortness of breath.  Mr. Aspinall states he has been experiencing shortness of breath for a few days now in addition to increasing lower extremity swelling.  He endorses a productive cough but denies any fever, chills, congestion, nausea, vomiting, diarrhea.  He feels as though his abdomen has become more distended but denies any abdominal pain.  He denies any sick contacts.  He denies any palpitations or chest pain.  ED course: On arrival to the ED, patient was hypertensive at 179/114 with heart rate of 86.  She was saturating at 100% on BiPAP.  He was afebrile at 98.8.  Initial workup notable for WBC 17.1, glucose 109, BUN 87, creatinine 3.63, GFR 17.  BNP 1867.  Troponin 80 and 72.  Lactic acid 1.9 and then 2.8.  COVID-19, influenza and RSV PCR negative.  Chest x-ray with no active disease.  Patient started on albuterol , Lasix , and [facility] contacted for admission.  Review of Systems: As mentioned in the history of present illness. All other systems reviewed and are negative.  Past Medical History:  Diagnosis Date   Arthritis    lower back   Back pain    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    CHF (congestive heart failure) (HCC)    Chronic kidney disease    Complication of anesthesia    Violent with hallucinations after procedure 10/2015   Coronary artery disease    DDD (degenerative disc disease), lumbar    Duodenal ulcer    Elevated lipids    GERD (gastroesophageal reflux disease)    Leg weakness, bilateral    s/p  lumbar surgery   PAD (peripheral artery disease) (HCC)    Polyradiculitis    Stroke (HCC)    Rt lower leg  estimated 10 - 12 stokes in last 5 years   Wears dentures    full upper and lower   Past Surgical History:  Procedure Laterality Date   AORTA - BILATERAL FEMORAL ARTERY BYPASS GRAFT N/A 11/08/2015   Procedure: AORTA BIFEMORAL BYPASS GRAFT;  Surgeon: Jackquelyn Mass, MD;  Location: [facility];  Service: Vascular;  Laterality: N/A;   BACK SURGERY  2005   CATARACT EXTRACTION W/PHACO Right 02/20/2022   Procedure: CATARACT EXTRACTION PHACO AND INTRAOCULAR LENS PLACEMENT (IOC) RIGHT;  Surgeon: Annell Kidney, MD;  Location: [facility];  Service: Ophthalmology;  Laterality: Right;  5.77 0:52.3   CATARACT EXTRACTION W/PHACO Left 03/06/2022   Procedure: CATARACT EXTRACTION PHACO AND INTRAOCULAR LENS PLACEMENT (IOC) LEFT;  Surgeon: Annell Kidney, MD;  Location: [facility];  Service: Ophthalmology;  Laterality: Left;  5.77 1:07.5   CERVICAL DISC SURGERY  1987   COLONOSCOPY WITH PROPOFOL  N/A 12/30/2017   Procedure: COLONOSCOPY WITH PROPOFOL ;  Surgeon: Toledo, Alphonsus Jeans, MD;  Location: [facility];  Service: Gastroenterology;  Laterality: N/A;   COLONOSCOPY WITH PROPOFOL  N/A 02/02/2021   Procedure: COLONOSCOPY WITH PROPOFOL ;  Surgeon: Shane Darling, MD;  Location: [facility];  Service: Endoscopy;  Laterality: N/A;  Melena   ENDARTERECTOMY FEMORAL Bilateral 11/08/2015  Procedure: ENDARTERECTOMY FEMORAL;  Surgeon: Jackquelyn Mass, MD;  Location: [facility];  Service: Vascular;  Laterality: Bilateral;  common, SFA, and profundis  Aortic endarterectomy    ESOPHAGOGASTRODUODENOSCOPY (EGD) WITH PROPOFOL  N/A 09/04/2015   Procedure: ESOPHAGOGASTRODUODENOSCOPY (EGD) WITH PROPOFOL ;  Surgeon: Marnee Sink, MD;  Location: [facility];  Service: Endoscopy;  Laterality: N/A;   ESOPHAGOGASTRODUODENOSCOPY (EGD) WITH PROPOFOL  N/A 02/02/2021   Procedure:  ESOPHAGOGASTRODUODENOSCOPY (EGD) WITH PROPOFOL ;  Surgeon: Shane Darling, MD;  Location: [facility];  Service: Endoscopy;  Laterality: N/A;  Melena   LUMBAR LAMINECTOMY  2006   RIGHT/LEFT HEART CATH AND CORONARY ANGIOGRAPHY Bilateral 03/28/2021   Procedure: RIGHT/LEFT HEART CATH AND CORONARY ANGIOGRAPHY;  Surgeon: Antonette Batters, MD;  Location: [facility];  Service: Cardiovascular;  Laterality: Bilateral;   SEPTOPLASTY N/A 09/28/2020   Procedure: SEPTOPLASTY;  Surgeon: Mellody Sprout, MD;  Location: [facility];  Service: ENT;  Laterality: N/A;   TEE WITHOUT CARDIOVERSION N/A 09/22/2015   Procedure: TRANSESOPHAGEAL ECHOCARDIOGRAM (TEE);  Surgeon: Ronney Cola, MD;  Location: [facility];  Service: Cardiovascular;  Laterality: N/A;   TURBINATE REDUCTION Bilateral 09/28/2020   Procedure: TURBINATE REDUCTION;  Surgeon: Mellody Sprout, MD;  Location: [facility];  Service: ENT;  Laterality: Bilateral;   Social History:  reports that he has been smoking cigarettes. He has a 45 pack-year smoking history. He has never used smokeless tobacco. He reports current alcohol use. He reports that he does not use drugs.  No Known Allergies  Family History  Problem Relation Age of Onset   Heart attack Mother    Hypertension Mother    Varicose Veins Mother    Diabetes Father    Heart attack Father    Heart attack Maternal Grandmother    Stroke Maternal Grandmother     Prior to Admission medications   Medication Sig Start Date End Date Taking? Authorizing Provider  albuterol  (VENTOLIN  HFA) 108 (90 Base) MCG/ACT inhaler Inhale 2 puffs into the lungs every 6 (six) hours as needed for wheezing or shortness of breath. 06/20/22   Patel, Sona, MD  amLODipine  (NORVASC ) 5 MG tablet Take 5 mg by mouth 2 (two) times daily.    [provider]  apixaban  (ELIQUIS ) 2.5 MG TABS tablet Take 2.5 mg by mouth 2 (two) times daily. 03/29/22   [provider]  Cholecalciferol  (VITAMIN D-1000 MAX ST) 25 MCG (1000 UT) tablet Take 1,000 Units by mouth daily.    [provider]  clopidogrel  (PLAVIX ) 75 MG tablet Take 75 mg by mouth daily. 04/16/21   [provider]  Coenzyme Q10 (COQ10) 200 MG CAPS Take 200 mg by mouth daily.    [provider]  fluticasone  (FLONASE ) 50 MCG/ACT nasal spray Place 1 spray into both nostrils 2 (two) times daily. Patient not taking: Reported on 09/25/2022 05/05/22   [provider]  furosemide  (LASIX ) 40 MG tablet Take 1 tablet (40 mg total) by mouth daily. for weight gain or swelling Patient taking differently: Take 40 mg by mouth daily. for weight gain or swelling. Takes 2-3 daily 10/30/21   Amin, Sumayya, MD  hydrALAZINE  (APRESOLINE ) 100 MG tablet Take 100 mg by mouth 3 (three) times daily.    [provider]  Iron-Vitamin C (VITRON-C) 65-125 MG TABS Take 1 tablet by mouth daily.    [provider]  isosorbide  dinitrate (ISORDIL ) 40 MG tablet Take 1 tablet (40 mg total) by mouth 3 (three) times daily. 06/20/22   Patel, Sona,  MD  metolazone (ZAROXOLYN) 5 MG tablet Take 5 mg by mouth 2 (two) times a week. 04/25/22   [provider]  metoprolol  succinate (TOPROL -XL) 25 MG 24 hr tablet Take 1 tablet (25 mg total) by mouth daily. 02/07/21   Montey Apa, DO  Multiple Vitamins-Minerals (MULTIVITAMIN ADULT PO) Take 1 tablet by mouth daily. Men    [provider]  nitroGLYCERIN  (NITROSTAT ) 0.4 MG SL tablet Place 1 tablet (0.4 mg total) under the tongue every 5 (five) minutes x 3 doses as needed for chest pain. Patient not taking: Reported on 09/25/2022 06/20/22   Patel, Sona, MD  Omega-3 Fatty Acids (FISH OIL PO) Take by mouth.    [provider]  pantoprazole  (PROTONIX ) 40 MG tablet Take 40 mg by mouth daily. 03/26/22   [provider]  potassium chloride  SA (KLOR-CON  M) 20 MEQ tablet Take 20 mEq by mouth 2 (two) times daily.    [provider]  rosuvastatin   (CRESTOR ) 40 MG tablet Take 20 mg by mouth daily.    [provider]  vitamin B-12 (CYANOCOBALAMIN ) 1000 MCG tablet Take 1,000 mcg by mouth daily.    [provider]    Physical Exam: Vitals:   07/11/23 1400 07/11/23 1430 07/11/23 1544 07/11/23 1746  BP: (!) 182/96 (!) 180/94  (!) 151/83  Pulse: 80 82  79  Resp: 16 16  18   Temp:   97.8 F (36.6 C)   TempSrc:   Oral   SpO2: 95% 95%  100%  Weight:       Physical Exam Vitals and nursing note reviewed.  Constitutional:      Appearance: He is normal weight.  HENT:     Head: Normocephalic and atraumatic.     Mouth/Throat:     Mouth: Mucous membranes are moist.     Pharynx: Oropharynx is clear.  Eyes:     Conjunctiva/sclera: Conjunctivae normal.     Pupils: Pupils are equal, round, and reactive to light.  Cardiovascular:     Rate and Rhythm: Normal rate and regular rhythm.     Heart sounds: No murmur heard. Pulmonary:     Effort: Respiratory distress present.     Breath sounds: Rales (Bilateral up to mid lung fields) present. No wheezing or rhonchi.  Abdominal:     General: There is distension.     Palpations: Abdomen is soft.     Tenderness: There is no abdominal tenderness. There is no guarding.  Musculoskeletal:     Right lower leg: 2+ Pitting Edema present.     Left lower leg: 2+ Pitting Edema present.  Skin:    General: Skin is warm and dry.  Neurological:     Mental Status: He is alert and oriented to person, place, and time. Mental status is at baseline.  Psychiatric:        Mood and Affect: Mood normal.        Behavior: Behavior normal.    Data Reviewed: CBC with WBC of 17.1, hemoglobin of 14.5, platelets of 209 BMP with sodium of 137, potassium 4.6, bicarb 23, glucose 109, BUN 87, creatinine 3.63, GFR 17 BNP 1867 Troponin 80 and then 72 Lactic acid 1.9 then 2.8 INR 1.2 COVID-19, influenza and RSV PCR negative  EKG personally reviewed.  Sinus rhythm with first-degree AV block.  No acute  ischemic changes.  DG Chest Portable 1 View Result Date: 07/11/2023 CLINICAL DATA:  Dyspnea EXAM: PORTABLE CHEST 1 VIEW COMPARISON:  06/17/2022 chest radiograph. FINDINGS:  Stable cardiomediastinal silhouette with normal heart size. No pneumothorax. No pleural effusion. Lungs appear clear, with no acute consolidative airspace disease and no pulmonary edema. IMPRESSION: No active disease. Electronically Signed   By: Levell Reach M.D.   On: 07/11/2023 11:18   There are no new results to review at this time.  Assessment and Plan:  * Acute on chronic respiratory failure with hypoxia (HCC) Patient is presenting with several days of acute shortness of breath, most consistent with acute on chronic heart failure, although initial concern for COPD exacerbation.  - Weaned off BiPAP at this time.  Continue supplemental oxygen to maintain oxygen saturation above 88%  Acute on chronic systolic CHF (congestive heart failure) (HCC) Patient has a history of HFrEF with EF as low as 25-30% with global hypokinesis in 2022.  Most recent echocardiogram in 2024 demonstrated recovery of EF at 55-60%. Patient with several days of increasing lower extremity swelling and abdominal distention that coincides with his shortness of breath.  BNP elevated.  - Telemetry monitoring - Echocardiogram ordered since it has been ~ 1 year now since last - Start Lasix  40 mg IV twice daily - Daily BMP and magnesium  - Continue home GDMT as blood pressure tolerates - Strict ins and out - Daily weights  COPD with acute exacerbation (HCC) Initial concern for COPD exacerbation given shortness of breath and cough.  Suspect this is mostly driven by heart failure, however will continue steroid and breathing treatments.  - Start prednisone  40 mg daily tomorrow - DuoNebs every 6 hours - Hold home bronchodilators  CAD S/P percutaneous coronary angioplasty No chest pain reported at this time.  Troponin is elevated, however improved  compared to prior.  - Continue home regimen  Chronic anticoagulation - Continue home regimen  History of CVA and bilateral carotid artery stenosis (cerebrovascular accident) Patient states that his PCP stopped his Plavix  yesterday without explanation.  I am not able to find a reason in the chart that it was discontinued but I can see that the order was placed.  He has a history of CVA with carotid artery stenosis, as well as extensive PAD.  Given he is not taking aspirin , I suspect he will need continued antiplatelet therapy in addition to Eliquis .  - Restart home Plavix   CKD (chronic kidney disease) stage 4, GFR 15-29 ml/min (HCC) Per chart review, patient has a history of CKD stage IV with creatinine ranging between as low as 2.1 and up to 3.5, currently 3.6.  - Monitor renal function with daily BMP while diuresing  Benign essential HTN - Resume home regimen  Advance Care Planning:   Code Status: Full Code   Consults: None  Family Communication: Patient's wife updated at bedside  Severity of Illness: The appropriate patient status for this patient is OBSERVATION. Observation status is judged to be reasonable and necessary in order to provide the required intensity of service to ensure the patient's safety. [name] presenting symptoms, physical exam findings, and initial radiographic and laboratory data in the context of their medical condition is felt to place them at decreased risk for further clinical deterioration. Furthermore, it is anticipated that the patient will be medically stable for discharge from the hospital within 2 midnights of admission.   Author: Avi Body, MD 07/11/2023 7:14 PM  For on call review [email or web address].

## 2023-07-11 NOTE — Assessment & Plan Note (Signed)
 Patient is presenting with several days of acute shortness of breath, most consistent with acute on chronic heart failure, although initial concern for COPD exacerbation.  - Weaned off BiPAP at this time.  Continue supplemental oxygen to maintain oxygen saturation above 88%

## 2023-07-11 NOTE — Assessment & Plan Note (Signed)
 No chest pain reported at this time.  Troponin is elevated, however improved compared to prior.  - Continue home regimen

## 2023-07-12 ENCOUNTER — Observation Stay
Admit: 2023-07-12 | Discharge: 2023-07-12 | Disposition: A | Discharge status: Home / Self Care | Attending: Internal Medicine | Admitting: Internal Medicine

## 2023-07-12 DIAGNOSIS — Z7902 Long term (current) use of antithrombotics/antiplatelets: Secondary | ICD-10-CM | POA: Diagnosis not present

## 2023-07-12 DIAGNOSIS — K219 Gastro-esophageal reflux disease without esophagitis: Secondary | ICD-10-CM | POA: Diagnosis present

## 2023-07-12 DIAGNOSIS — I132 Hypertensive heart and chronic kidney disease with heart failure and with stage 5 chronic kidney disease, or end stage renal disease: Secondary | ICD-10-CM | POA: Diagnosis present

## 2023-07-12 DIAGNOSIS — I739 Peripheral vascular disease, unspecified: Secondary | ICD-10-CM | POA: Diagnosis present

## 2023-07-12 DIAGNOSIS — Z1152 Encounter for screening for COVID-19: Secondary | ICD-10-CM | POA: Diagnosis not present

## 2023-07-12 DIAGNOSIS — M51369 Other intervertebral disc degeneration, lumbar region without mention of lumbar back pain or lower extremity pain: Secondary | ICD-10-CM | POA: Diagnosis present

## 2023-07-12 DIAGNOSIS — Z77028 Contact with and (suspected) exposure to other hazardous aromatic compounds: Secondary | ICD-10-CM | POA: Diagnosis present

## 2023-07-12 DIAGNOSIS — F17210 Nicotine dependence, cigarettes, uncomplicated: Secondary | ICD-10-CM | POA: Diagnosis present

## 2023-07-12 DIAGNOSIS — J9621 Acute and chronic respiratory failure with hypoxia: Secondary | ICD-10-CM

## 2023-07-12 DIAGNOSIS — Z961 Presence of intraocular lens: Secondary | ICD-10-CM | POA: Diagnosis present

## 2023-07-12 DIAGNOSIS — Z992 Dependence on renal dialysis: Secondary | ICD-10-CM | POA: Diagnosis not present

## 2023-07-12 DIAGNOSIS — E785 Hyperlipidemia, unspecified: Secondary | ICD-10-CM | POA: Diagnosis present

## 2023-07-12 DIAGNOSIS — D631 Anemia in chronic kidney disease: Secondary | ICD-10-CM | POA: Diagnosis present

## 2023-07-12 DIAGNOSIS — I2510 Atherosclerotic heart disease of native coronary artery without angina pectoris: Secondary | ICD-10-CM | POA: Diagnosis present

## 2023-07-12 DIAGNOSIS — N2581 Secondary hyperparathyroidism of renal origin: Secondary | ICD-10-CM | POA: Diagnosis present

## 2023-07-12 DIAGNOSIS — N179 Acute kidney failure, unspecified: Secondary | ICD-10-CM | POA: Diagnosis present

## 2023-07-12 DIAGNOSIS — Z79899 Other long term (current) drug therapy: Secondary | ICD-10-CM | POA: Diagnosis not present

## 2023-07-12 DIAGNOSIS — I1310 Hypertensive heart and chronic kidney disease without heart failure, with stage 1 through stage 4 chronic kidney disease, or unspecified chronic kidney disease: Secondary | ICD-10-CM | POA: Diagnosis present

## 2023-07-12 DIAGNOSIS — J439 Emphysema, unspecified: Secondary | ICD-10-CM | POA: Diagnosis present

## 2023-07-12 DIAGNOSIS — N186 End stage renal disease: Secondary | ICD-10-CM | POA: Diagnosis present

## 2023-07-12 DIAGNOSIS — Z7901 Long term (current) use of anticoagulants: Secondary | ICD-10-CM | POA: Diagnosis not present

## 2023-07-12 DIAGNOSIS — J441 Chronic obstructive pulmonary disease with (acute) exacerbation: Secondary | ICD-10-CM | POA: Diagnosis present

## 2023-07-12 DIAGNOSIS — M109 Gout, unspecified: Secondary | ICD-10-CM | POA: Diagnosis present

## 2023-07-12 DIAGNOSIS — Z6841 Body Mass Index (BMI) 40.0 and over, adult: Secondary | ICD-10-CM | POA: Diagnosis not present

## 2023-07-12 DIAGNOSIS — I5043 Acute on chronic combined systolic (congestive) and diastolic (congestive) heart failure: Secondary | ICD-10-CM | POA: Diagnosis present

## 2023-07-12 LAB — CBC WITH DIFFERENTIAL/PLATELET
Abs Immature Granulocytes: 0.09 10*3/uL — ABNORMAL HIGH (ref 0.00–0.07)
Basophils Absolute: 0 10*3/uL (ref 0.0–0.1)
Basophils Relative: 0 %
Eosinophils Absolute: 0 10*3/uL (ref 0.0–0.5)
Eosinophils Relative: 0 %
HCT: 37.6 % — ABNORMAL LOW (ref 39.0–52.0)
Hemoglobin: 12.9 g/dL — ABNORMAL LOW (ref 13.0–17.0)
Immature Granulocytes: 1 %
Lymphocytes Relative: 5 %
Lymphs Abs: 0.6 10*3/uL — ABNORMAL LOW (ref 0.7–4.0)
MCH: 29.5 pg (ref 26.0–34.0)
MCHC: 34.3 g/dL (ref 30.0–36.0)
MCV: 85.8 fL (ref 80.0–100.0)
Monocytes Absolute: 0.5 10*3/uL (ref 0.1–1.0)
Monocytes Relative: 4 %
Neutro Abs: 11.3 10*3/uL — ABNORMAL HIGH (ref 1.7–7.7)
Neutrophils Relative %: 90 %
Platelets: 192 10*3/uL (ref 150–400)
RBC: 4.38 MIL/uL (ref 4.22–5.81)
RDW: 15.2 % (ref 11.5–15.5)
WBC: 12.5 10*3/uL — ABNORMAL HIGH (ref 4.0–10.5)
nRBC: 0 % (ref 0.0–0.2)

## 2023-07-12 LAB — BASIC METABOLIC PANEL WITH GFR
Anion gap: 11 (ref 5–15)
BUN: 97 mg/dL — ABNORMAL HIGH (ref 8–23)
CO2: 25 mmol/L (ref 22–32)
Calcium: 9 mg/dL (ref 8.9–10.3)
Chloride: 98 mmol/L (ref 98–111)
Creatinine, Ser: 3.68 mg/dL — ABNORMAL HIGH (ref 0.61–1.24)
GFR, Estimated: 17 mL/min — ABNORMAL LOW (ref >60)
Glucose, Bld: 129 mg/dL — ABNORMAL HIGH (ref 70–99)
Potassium: 4.2 mmol/L (ref 3.5–5.1)
Sodium: 134 mmol/L — ABNORMAL LOW (ref 135–145)

## 2023-07-12 LAB — MAGNESIUM: Magnesium: 2.4 mg/dL (ref 1.7–2.4)

## 2023-07-12 MED ORDER — DEXTROSE 5 % IV SOLN
6.0000 mg/h | INTRAVENOUS | Status: DC
  Administered 2023-07-12 – 2023-07-13 (×2): 6 mg/h via INTRAVENOUS
  Filled 2023-07-12 (×2): qty 20

## 2023-07-12 MED ORDER — IPRATROPIUM-ALBUTEROL 20-100 MCG/ACT IN AERS
1.0000 | INHALATION_SPRAY | Freq: Four times a day (QID) | RESPIRATORY_TRACT | Status: DC
  Administered 2023-07-12 – 2023-07-24 (×40): 1 via RESPIRATORY_TRACT
  Filled 2023-07-12 (×2): qty 4

## 2023-07-12 NOTE — Evaluation (Signed)
 Physical Therapy Evaluation Patient Details Name: Gary Mccoy. MRN: [id number] DOB: Apr 18, 1950 Today's Date: 07/12/2023  History of Present Illness  Gary Mccoy. is a 73 y.o. male with medical history significant of HFpEF, CAD, PAD, carotid artery stenosis, COPD, CKD stage IV, hyperlipidemia, CVA, who presents to the ED due to shortness of breath.  Clinical Impression  73 yo Male presents to hospital with increased shortness of breath. Patient is currently on 3L O2. He denies using O2 at home prior to admittance but reports having all equipment from a previous admission approximately 1 year ago. Patient lives at home with his wife. He reports being independent in all self care ADLs. He denies any falls in last year. Patient has a PMH significant for prior CVA with resultant RLE impaired gross motor with mild weakness (4/5). Patient was up in chair during PT evaluation and therefore bed mobility was not assessed. He seemed frustrated that he was connected to chair alarm and when therapist expressed she was going to disable for mobility he quickly stood up not waiting for therapist instruction. Patient does demonstrate increased impulsivity  during evaluation. Unsure if this is a side effect from prior CVA or if its baseline. He demonstrates mild unsteadiness as a result of impulsivity. When asked about it, patient reports, "Yeah, I'm not real smooth. I have always been clumsy but so what. That's how I move and even though it doesn't always look great, I can maintain my balance." Patient did desat to 85% while moving on room air. This quickly rebounded to >94% on 3L. He ambulated >175 feet on 2L O2 and desat to 88%. Upon sitting and resting and resuming 3L he recovered to >93% in under 30 seconds. Patient would benefit from skilled PT intervention to improve gait safety and mobility while increasing cardiovascular endurance.         If plan is discharge home, recommend the following: A  little help with walking and/or transfers;A little help with bathing/dressing/bathroom;Assistance with cooking/housework;Assist for transportation;Help with stairs or ramp for entrance;Direct supervision/assist for medications management   Can travel by private vehicle        Equipment Recommendations None recommended by PT  Recommendations for Other Services       Functional Status Assessment Patient has had a recent decline in their functional status and demonstrates the ability to make significant improvements in function in a reasonable and predictable amount of time.     Precautions / Restrictions Precautions Precautions: Fall Precaution/Restrictions Comments: he denies any falls in last year, but appears impulsive. Restrictions Weight Bearing Restrictions Per Provider Order: No      Mobility  Bed Mobility               General bed mobility comments: Not observed this session; Pt was sitting up in chair throughout PT evaluation    Transfers Overall transfer level: Needs assistance Equipment used: None Transfers: Sit to/from Stand Sit to Stand: Supervision           General transfer comment: Pt transfers sit to stand from recliner pushing on arm rests with good safety awareness; He can also transfer from chair without arm rests with close supervision. Completed 5x sit<>Stand in 17 sec (>15 sec indicates increased risk for falls);    Ambulation/Gait Ambulation/Gait assistance: Contact guard assist, Supervision Gait Distance (Feet): 175 Feet Assistive device:  (portable O2 tank) Gait Pattern/deviations: Step-through pattern       General Gait Details: pt ambulates with reciprocal  gait pattern, slightly wider base of support, he demonstrates occasional unsteadiness; Pt impulsive and will move quickly to reach something or go somewhere with slight unsteadiness noted.  Stairs            Wheelchair Mobility     Tilt Bed    Modified Rankin (Stroke  Patients Only)       Balance Overall balance assessment: Needs assistance Sitting-balance support: Feet supported, No upper extremity supported Sitting balance-Leahy Scale: Good     Standing balance support: During functional activity, No upper extremity supported Standing balance-Leahy Scale: Fair Standing balance comment: Good static standing, when amb pt often reaches for furnitature for stability                             Pertinent Vitals/Pain Pain Assessment Pain Assessment: No/denies pain    Home Living Family/patient expects to be discharged to:: Private residence Living Arrangements: Spouse/significant other Available Help at Discharge: Family;Available 24 hours/day Type of Home: House Home Access: Stairs to enter Entrance Stairs-Rails: None Entrance Stairs-Number of Steps: 2 Alternate Level Stairs-Number of Steps: 12 Home Layout: Two level;Able to live on main level with bedroom/bathroom Home Equipment: Cane - Programmer, applications (2 wheels);Wheelchair - manual Additional Comments: DME from father-in-law    Prior Function Prior Level of Function : Independent/Modified Independent;Driving             Mobility Comments: NO DME, states he doesnt walk well from previous CVA R sided       Extremity/Trunk Assessment   Upper Extremity Assessment Upper Extremity Assessment: Overall WFL for tasks assessed    Lower Extremity Assessment Lower Extremity Assessment: Overall WFL for tasks assessed RLE Deficits / Details: HX r-sided CVA, gross strength 4/5, RLE Coordination: decreased gross motor    Cervical / Trunk Assessment Cervical / Trunk Assessment: Normal  Communication   Communication Communication: No apparent difficulties    Cognition Arousal: Alert Behavior During Therapy: Agitated, Impulsive                           PT - Cognition Comments: oriented to situation, date, person and place; however does have inconsistency in  stories and gets agitated when questioned about history and ability Following commands: Impaired Following commands impaired: Follows one step commands inconsistently (pt impulsive and will follow some commands and then also will do self directed task as desired.)     Cueing Cueing Techniques: Verbal cues     General Comments General comments (skin integrity, edema, etc.): vitals monitored throughout session; when completing 5 times sit<>Stand without O2 support, pt dropped to 85%, resumed 3L O2 and quickly rebounded to 93% in <30 sec; Pt ambulated in hallway >175 feet on 2L O2, Spo2 dropped to 88% upon returning to room. Instructed patient to sit and take slow deep breaths, quickly rebounded to >94%; PT resumed 3LO2 nasal cannula while seated in chair.    Exercises Other Exercises Other Exercises: PT educated patient in role of PT and benefits of mobiltiy to help recover; Pt verbalized understanding. He expressed he didn't think he needed therapeutic intervention and refused chair alarm stating, "I am going to get up and move around my room freely. No chair alarm is going to keep me from falling." Nurse tech and RN informed of patients refusal; He was left in chair with needs in reach and wife seated beside him.   Assessment/Plan  PT Assessment Patient needs continued PT services  PT Problem List Decreased strength;Cardiopulmonary status limiting activity;Decreased activity tolerance;Decreased balance;Decreased safety awareness;Decreased mobility;Decreased knowledge of precautions       PT Treatment Interventions Balance training;DME instruction;Gait training;Neuromuscular re-education;Stair training;Functional mobility training;Patient/family education;Therapeutic activities;Therapeutic exercise    PT Goals (Current goals can be found in the Care Plan section)  Acute Rehab PT Goals Patient Stated Goal: to return home PT Goal Formulation: With patient Time For Goal Achievement:  07/26/23 Potential to Achieve Goals: Good    Frequency Min 1X/week     Co-evaluation               AM-PAC PT "6 Clicks" Mobility  Outcome Measure Help needed turning from your back to your side while in a flat bed without using bedrails?: None Help needed moving from lying on your back to sitting on the side of a flat bed without using bedrails?: None Help needed moving to and from a bed to a chair (including a wheelchair)?: None Help needed standing up from a chair using your arms (e.g., wheelchair or bedside chair)?: None Help needed to walk in hospital room?: A Little Help needed climbing 3-5 steps with a railing? : A Little 6 Click Score: 22    End of Session Equipment Utilized During Treatment: Gait belt Activity Tolerance: Patient tolerated treatment well Patient left: in chair;with call bell/phone within reach Nurse Communication: Mobility status;Other (comment) (pt refusing chair alarm, RN/NA notified) PT Visit Diagnosis: Unsteadiness on feet (R26.81);Muscle weakness (generalized) (M62.81)    Time: 1040-1105 PT Time Calculation (min) (ACUTE ONLY): 25 min   Charges:   PT Evaluation $PT Eval Low Complexity: 1 Low   PT General Charges $$ ACUTE PT VISIT: 1 Visit          Serrena Linderman PT, DPT 07/12/2023, 1:01 PM

## 2023-07-12 NOTE — Progress Notes (Signed)
  Echocardiogram 2D Echocardiogram has been performed.  Gary Mccoy 07/12/2023, 5:37 PM

## 2023-07-12 NOTE — Progress Notes (Signed)
 Occupational Therapy Evaluation Patient Details Name: Gary Mccoy. MRN: [id number] DOB: [birth year] Today's Date: 07/12/2023   History of Present Illness   Gary A Damarrius Kulas. is a 73 y.o. male with medical history significant of HFpEF, CAD, PAD, carotid artery stenosis, COPD, CKD stage IV, hyperlipidemia, CVA, who presents to the ED due to shortness of breath.     Clinical Impressions Pt was seen for OT evaluation this date. Prior to hospital admission, pt was indep/modi, amb with no DME. Pt lives with his wife in multi level home, able to live on the main level. Pt reports 2 falls in the last 6 months. Alert and oriented x4 throughout session. Pt presents to acute OT demonstrating impaired ADL performance and functional mobility 2/2 (See OT problem list for additional functional deficits). MOD I for all bed mobility. Pt currently requires CGA during amb around room, pt often furniture walks, refuses DME use but would greatly benefit from the added external support. Pt refuses DME use during session. Noted 2 LOB when amb, pt able to self correct each occurrence. Pt on 3L oxygen on arrival to room via [place], pt insists to take it off for mobility, Spo2 levels dropped from mid 90s on 3Ls to 86% on RA during mobility. Pt levels increases when seated and with encouraged pursed lip breathing. 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 Pt would benefit from skilled OT services to address noted impairments and functional limitations (see below for any additional details) in order to maximize safety and independence while minimizing falls risk and caregiver burden. OT will follow acutely.      If plan is discharge home, recommend the following:   A little help with walking and/or transfers;A little help with bathing/dressing/bathroom;Assistance with cooking/housework     Functional Status Assessment   Patient has had a recent decline in their functional  status and demonstrates the ability to make significant improvements in function in a reasonable and predictable amount of time.     Equipment Recommendations   None recommended by OT     Recommendations for Other Services         Precautions/Restrictions   Precautions Precautions: Fall Restrictions Weight Bearing Restrictions Per Provider Order: No     Mobility Bed Mobility Overal bed mobility: Modified Independent                  Transfers Overall transfer level: Needs assistance Equipment used: None Transfers: Sit to/from Stand, Bed to chair/wheelchair/BSC Sit to Stand: Supervision           General transfer comment: STS supervision, during amb unsteady CGA throughout, declined DME use      Balance Overall balance assessment: Needs assistance Sitting-balance support: Feet supported, No upper extremity supported Sitting balance-Leahy Scale: Good     Standing balance support: During functional activity, No upper extremity supported Standing balance-Leahy Scale: Fair Standing balance comment: Good static standing, when amb pt often reaches for furnitature for stability                           ADL either performed or assessed with clinical judgement   ADL Overall ADL's : Needs assistance/impaired Eating/Feeding: Independent               Upper Body Dressing : Contact guard assist;Standing       Toilet Transfer: Contact guard assist;Ambulation;Grab bars;Cueing for safety  Functional mobility during ADLs: Contact guard assist General ADL Comments: CGA UB dressing donning gown in standing, toilet transfer CGA, no DME use     Vision Baseline Vision/History: 1 Wears glasses                         Pertinent Vitals/Pain Pain Assessment Pain Assessment: No/denies pain     Extremity/Trunk Assessment Upper Extremity Assessment Upper Extremity Assessment: Overall WFL for tasks assessed;Generalized  weakness   Lower Extremity Assessment Lower Extremity Assessment: Overall WFL for tasks assessed;RLE deficits/detail RLE Deficits / Details: HX r-sided CVA RLE Coordination: decreased gross motor   Cervical / Trunk Assessment Cervical / Trunk Assessment: Normal   Communication Communication Communication: No apparent difficulties   Cognition Arousal: Alert Behavior During Therapy: WFL for tasks assessed/performed Cognition: No apparent impairments             OT - Cognition Comments: A/Ox4                 Following commands: Intact       Cueing  General Comments   Cueing Techniques: Verbal cues      Exercises Exercises: Other exercises Other Exercises Other Exercises: Edu: Role of OT, benefits of gait belt and DME use when unsteady, safe ADL completion, fall prevention   Shoulder Instructions      Home Living Family/patient expects to be discharged to:: Private residence Living Arrangements: Spouse/significant other Available Help at Discharge: Family;Available 24 hours/day Type of Home: House Home Access: Stairs to enter [employer] of Steps: 2 Entrance Stairs-Rails: None Home Layout: Two level;Able to live on main level with bedroom/bathroom Alternate Level Stairs-Number of Steps: 12 Alternate Level Stairs-Rails: Right Bathroom Shower/Tub: Chief Strategy Officer: Standard     Home Equipment: Cane - Programmer, applications (2 wheels);Wheelchair - manual   Additional Comments: DME from father-in-law      Prior Functioning/Environment Prior Level of Function : Independent/Modified Independent;Driving             Mobility Comments: NO DME, states he doesnt walk well from previous CVA R sided      OT Problem List: Decreased strength;Decreased activity tolerance;Impaired balance (sitting and/or standing);Decreased coordination;Decreased safety awareness;Decreased knowledge of use of DME or AE;Decreased knowledge of  precautions   OT Treatment/Interventions: Self-care/ADL training;Therapeutic exercise;Energy conservation;DME and/or AE instruction;Therapeutic activities;Cognitive remediation/compensation;Patient/family education;Balance training      OT Goals(Current goals can be found in the care plan section)   Acute Rehab OT Goals Patient Stated Goal: return home and breathe better OT Goal Formulation: With patient Time For Goal Achievement: 07/26/23 Potential to Achieve Goals: Good ADL Goals Pt Will Perform Grooming: with modified independence;standing Pt Will Perform Lower Body Dressing: with modified independence;sit to/from stand Pt Will Transfer to Toilet: ambulating;regular height toilet;Independently Pt Will Perform Toileting - Clothing Manipulation and hygiene: Independently   OT Frequency:  Min 1X/week    Co-evaluation              AM-PAC OT "6 Clicks" Daily Activity     Outcome Measure Help from another person eating meals?: None Help from another person taking care of personal grooming?: None Help from another person toileting, which includes using toliet, bedpan, or urinal?: A Little Help from another person bathing (including washing, rinsing, drying)?: A Little Help from another person to put on and taking off regular upper body clothing?: None Help from another person to put on and taking off regular lower body  clothing?: None 6 Click Score: 22   End of Session Equipment Utilized During Treatment: Gait belt;Oxygen Nurse Communication: Mobility status  Activity Tolerance: Patient tolerated treatment well Patient left: in chair;with call bell/phone within reach;with chair alarm set  OT Visit Diagnosis: Unsteadiness on feet (R26.81);Other abnormalities of gait and mobility (R26.89);Repeated falls (R29.6);Muscle weakness (generalized) (M62.81)                Time: 1610-9604 OT Time Calculation (min): 25 min Charges:  OT General Charges $OT Visit: 1 Visit OT  Evaluation $OT Eval Low Complexity: 1 Low OT Treatments $Self Care/Home Management : 8-22 mins  Rosaria Common M.S. OTR/L  07/12/23, 10:15 AM

## 2023-07-12 NOTE — Progress Notes (Signed)
 Progress Note   Patient: Gary Mccoy. ZOX:[id number] DOB: 1950-10-12 DOA: 07/11/2023     0 DOS: the patient was seen and examined on 07/12/2023   Brief hospital course: 73yo with h/o HFrEF, CAD/PAD, carotid stenosis, stage IV CKD, COPD, HLD, and CVA who presented on 4/25 with SOB.  He was placed on BIPAP and weaned to 3L [place] O2, likely associated with CHF + COPD.  Diuresed with Lasix , echo pending, started on prednisone .    Assessment and Plan:  Cardiorenal syndrome with acute hypoxic respiratory failure Several days of acute shortness of breath, most consistent with acute on chronic heart failure, although initial concern for COPD exacerbation (appears unlikely currently) Required BIPAP transiently, currently on [place] O2   Cardiorenal syndrome - stage IV CKD Patient's renal dysfunction appears to be stable, but he appears unable to effective balance CHF associated with volume overload, not improved with Lasix  Review of labs indicate consistently elevated creatinine with stage 4 CKD Nephrology consulted He is willing to start HD when recommended Will change to Lasix  drip at 6 mg/hr to try to effectively draw off fluid and continue to closely monitor   Cardiorenal syndrome - acute on chronic diastolic CHF Reports 13 pound weight gain despite Lasix , Zaroxolyn, and spironolactone Check echo Given IV Lasix , will transition to Lasix  infusion    HLD Continue Crestor    HTN Hold amlodipine  Continue carvedilol , clonidine , hydralazine , Toprol  XL    COPD  Initial concern for COPD exacerbation given shortness of breath and cough Suspect this is mostly driven by heart failure Continue Combivent Stop steroids at this time   CAD S/P percutaneous coronary angioplasty No chest pain reported at this time Troponin is elevated, however improved compared to prior. Continue Eliquis    History of CVA and bilateral carotid artery stenosis (cerebrovascular accident) History of CVA with carotid  artery stenosis, as well as extensive PAD PCP recently stopped Plavix  Continue Eliquis       Consultants: Nephrology PT OT TOC team  Procedures: Echocardiogram  Antibiotics: None      Subjective: He reports 13 pounds of recent weight gain despite compliance with all medications including diuretics.  He has had progressive SOB.  Not usually on home O2.  No indication of infection.  Thinks it is all related to volume overload.  Willing to get HD when recommended.   Objective: Vitals:   07/12/23 0442 07/12/23 0826  BP: (!) 166/82 (!) 171/82  Pulse: 69 70  Resp: 16 18  Temp: 97.6 F (36.4 C) 97.9 F (36.6 C)  SpO2: 93% 99%    Intake/Output Summary (Last 24 hours) at 07/12/2023 1546 Last data filed at 07/11/2023 2344 Gross per 24 hour  Intake --  Output 400 ml  Net -400 ml   Filed Weights   07/11/23 1039 07/12/23 0500  Weight: 81.6 kg 82.4 kg    Exam:  General:  Appears calm and comfortable and is in NAD, up in bedside chair on [place] O2 Eyes:   EOMI, normal lids, iris ENT:  grossly normal hearing, lips & tongue, mmm Cardiovascular:  RRR, no m/r/g. 1+ LE edema.  Respiratory:   CTA bilaterally with no wheezes/rales/rhonchi.  Normal respiratory effort. Abdomen:  soft, NT, ND Skin:  no rash or induration seen on limited exam Musculoskeletal:  grossly normal tone BUE/BLE, good ROM, no bony abnormality Psychiatric:  grossly normal mood and affect, speech fluent and appropriate, AOx3 Neurologic:  CN 2-12 grossly intact, moves all extremities in coordinated fashion    Data  Reviewed: I have reviewed the patient's lab results since admission.  Pertinent labs for today include:   Glucose 129 BUN 97/Creatinine 3.68/GFR 16, stable BNP 1867.2 HS troponin 80, 72 Lactate 1.9, 2.8 WBC 12.5 Hgb 12.9 COVID/flu/RSV negative    Family Communication: Wife was present throughout evaluation  Disposition: Status is: Inpatient Admit - It is my clinical opinion that  admission to INPATIENT is reasonable and necessary because of the expectation that this patient will require hospital care that crosses at least 2 midnights to treat this condition based on the medical complexity of the problems presented.  Given the aforementioned information, the predictability of an adverse outcome is felt to be significant.      Time spent: 50 minutes  Unresulted Labs (From admission, onward)     Start     Ordered   07/13/23 0500  CBC with Differential/Platelet  Tomorrow morning,   R        07/12/23 1545   07/13/23 0500  Basic metabolic panel with GFR  Tomorrow morning,   R        07/12/23 1545             Author: Lorita Rosa, MD 07/12/2023 3:46 PM  For on call review [email or web address].

## 2023-07-12 NOTE — Hospital Course (Addendum)
 Hospital course / significant events:   HPI: 73yo with h/o HFrEF, CAD/PAD, carotid stenosis, stage IV CKD, COPD, HLD, and CVA who presented on 4/25 with SOB.    04/25: admitted to hospitalist w/ resp fail requiring BiPap, d/t COPD and HFrEF exacerbations.  04/26: on [place] O2, Nephrology consult for cardiorenal syndrome / advancing CKD. Started lasix  drip. Echo done.  04/27: continue lasix  drip 04/28: lasix  drip d/c  04/29: no immediate dialysis need, monitoring off lasix   04/30: tempcath placement, dialysis was started today 05/01-05/02: dialysis daily 05/03-05/04: holding HD for now, Cr up through the weekend 05/05: permcath placement was tentatively planned for today but nephrology opts to do 24h Cr clearance and hold off on permath/HD for now  05/06: awaiting 24-hour urine creatinine clearance, some SOB so plan for lasix  IV 80 mg x1 per nephro      Consultants:  Nephrology  Vascular surgery   Procedures/Surgeries: 07/16/23 R femoral temporary dialysis catheter placed      ASSESSMENT & PLAN:   Acute hypoxic respiratory failure Due to acute on chronic heart failure +/- bronchitis/COPD exacerbation  Required BiPAP on presentation, weaned to [place] O2 O2 support as needed  Treat underlying cause(s)   Cardiorenal syndrome complicated by stage IV CKD question progression to ESRD  Patient's renal dysfunction appears to be stable, but he appears unable to effectively balance CHF associated with volume overload, not improved with Lasix  Nephrology following  Monitor BMP, 24h CrCl to determine further dialysis  Acute on chronic combined CHF Echo with EF 45-50%, global hypokinesis, moderate to severe LV dilatation, grade 1 DD IV Lasix  --> Lasix  infusion stopped 4/28 --> off diuresis and monitoring renal fxn  awaiting 24-hour urine creatinine clearance prior to dose lasix  IV 80 mg x1 per nephro  Strict I&O Beta blocker w/ carvedilol  No ASA on Eliquis  Statin     HLD CAD hx  percutaneous coronary angioplasty HTN Amlodipine , carvedilol , clonidine , hydralazine  Isosrobide Crestor      COPD w/ questionable exacerbation given shortness of breath and cough but SOB worsened despite Levaquin and steroids x 4 days PTA, more likely d/t CHF  Combivent  prednisone  taper Albuterol  q2h prn No current wheezing noted   History of CVA and bilateral carotid artery stenosis (cerebrovascular accident) History of CVA with carotid artery stenosis, as well as extensive PAD PCP recently stopped Plavix  Continue Eliquis , Crestor   Hx gout Allopurinol  renal dose  GERD PPI  No concerns based on BMI: Body mass index is 24.25 kg/m.[name] Significantly low or high BMI is associated with higher medical risk.  Underweight - under 18  overweight - 25 to 29 obese - 30 or more Class 1 obesity: BMI of 30.0 to 34 Class 2 obesity: BMI of 35.0 to 39 Class 3 obesity: BMI of 40.0 to 49 Super Morbid Obesity: BMI 50-59 Super-super Morbid Obesity: BMI 60+ Healthy nutrition and physical activity advised as adjunct to other disease management and risk reduction treatments    DVT prophylaxis: heparin  IV fluids: no continuous IV fluids  Nutrition: cardiac/carb Central lines / other devices: tempcath for HD  Code Status: FULL CODE ACP documentation reviewed: none on file in [facility]  TOC needs: TBD Medical barriers to dispo: pending 24h creatinine clearance to determine need for further dialysis. Expected medical readiness for discharge several more days / may need outpatietn HD set up

## 2023-07-12 NOTE — Progress Notes (Signed)
 Patient refusing chair alarm at this time. Education provided to patient re: purpose of alarm and risk of falls/fractures/or injury without. Patient A/O x 4 and wife at bedside.

## 2023-07-13 ENCOUNTER — Inpatient Hospital Stay

## 2023-07-13 DIAGNOSIS — J9621 Acute and chronic respiratory failure with hypoxia: Secondary | ICD-10-CM | POA: Diagnosis not present

## 2023-07-13 LAB — PHOSPHORUS: Phosphorus: 6.6 mg/dL — ABNORMAL HIGH (ref 2.5–4.6)

## 2023-07-13 LAB — BASIC METABOLIC PANEL WITH GFR
Anion gap: 14 (ref 5–15)
BUN: 112 mg/dL — ABNORMAL HIGH (ref 8–23)
CO2: 23 mmol/L (ref 22–32)
Calcium: 8.6 mg/dL — ABNORMAL LOW (ref 8.9–10.3)
Chloride: 94 mmol/L — ABNORMAL LOW (ref 98–111)
Creatinine, Ser: 3.75 mg/dL — ABNORMAL HIGH (ref 0.61–1.24)
GFR, Estimated: 16 mL/min — ABNORMAL LOW (ref >60)
Glucose, Bld: 135 mg/dL — ABNORMAL HIGH (ref 70–99)
Potassium: 3.3 mmol/L — ABNORMAL LOW (ref 3.5–5.1)
Sodium: 131 mmol/L — ABNORMAL LOW (ref 135–145)

## 2023-07-13 LAB — CBC WITH DIFFERENTIAL/PLATELET
Abs Immature Granulocytes: 0.08 10*3/uL — ABNORMAL HIGH (ref 0.00–0.07)
Basophils Absolute: 0 10*3/uL (ref 0.0–0.1)
Basophils Relative: 0 %
Eosinophils Absolute: 0 10*3/uL (ref 0.0–0.5)
Eosinophils Relative: 0 %
HCT: 34.5 % — ABNORMAL LOW (ref 39.0–52.0)
Hemoglobin: 12.2 g/dL — ABNORMAL LOW (ref 13.0–17.0)
Immature Granulocytes: 1 %
Lymphocytes Relative: 5 %
Lymphs Abs: 0.5 10*3/uL — ABNORMAL LOW (ref 0.7–4.0)
MCH: 29.8 pg (ref 26.0–34.0)
MCHC: 35.4 g/dL (ref 30.0–36.0)
MCV: 84.4 fL (ref 80.0–100.0)
Monocytes Absolute: 0.8 10*3/uL (ref 0.1–1.0)
Monocytes Relative: 7 %
Neutro Abs: 10 10*3/uL — ABNORMAL HIGH (ref 1.7–7.7)
Neutrophils Relative %: 87 %
Platelets: 167 10*3/uL (ref 150–400)
RBC: 4.09 MIL/uL — ABNORMAL LOW (ref 4.22–5.81)
RDW: 14.9 % (ref 11.5–15.5)
WBC: 11.4 10*3/uL — ABNORMAL HIGH (ref 4.0–10.5)
nRBC: 0 % (ref 0.0–0.2)

## 2023-07-13 LAB — ECHOCARDIOGRAM COMPLETE
Area-P 1/2: 3.72 cm2
S' Lateral: 4.4 cm
Single Plane A4C EF: 45.5 %
Weight: 2906.54 [oz_av]

## 2023-07-13 MED ORDER — PREDNISONE 20 MG PO TABS
40.0000 mg | ORAL_TABLET | Freq: Every day | ORAL | Status: DC
  Administered 2023-07-13 – 2023-07-16 (×4): 40 mg via ORAL
  Filled 2023-07-13 (×4): qty 2

## 2023-07-13 MED ORDER — ALBUTEROL SULFATE (2.5 MG/3ML) 0.083% IN NEBU
2.5000 mg | INHALATION_SOLUTION | RESPIRATORY_TRACT | Status: DC | PRN
  Administered 2023-07-15 – 2023-07-17 (×3): 2.5 mg via RESPIRATORY_TRACT
  Filled 2023-07-13 (×3): qty 3

## 2023-07-13 NOTE — Progress Notes (Signed)
 Heart Failure Navigator Progress Note  Assessed for Heart & Vascular TOC clinic readiness.  Does not meet criteria due to current [facility] patient.   Navigator will sign off at this time.  Roxy Horseman, RN, BSN [facility] Heart Failure Navigator Secure Chat Only

## 2023-07-13 NOTE — Plan of Care (Signed)

## 2023-07-13 NOTE — Progress Notes (Signed)
 [facility]  ROUNDING NOTE   Subjective:  [name] was admitted to [facility] on 06/21/2023 for shortness of breath with suspect of CHF vs COPD exacerbation. Patient reports increase weight gain of 13lbs and states his concern for repeat of previous CHF exacerbation. Endorses no changes in urine output despite use of lasix . Reports abdomen feeling larger and continued shortness of breath. Patient denies use of NSAIDs. Denies changes in appetite or bowel function.    Objective:  Vital signs in last 24 hours:  Temp:  [97.4 F (36.3 C)-97.9 F (36.6 C)] 97.5 F (36.4 C) (04/27 1140) Pulse Rate:  [69-76] 69 (04/27 1140) Resp:  [14-18] 18 (04/27 1140) BP: (146-173)/(65-84) 146/65 (04/27 1140) SpO2:  [93 %-100 %] 95 % (04/27 1140) Weight:  [82.2 kg] 82.2 kg (04/27 0500)  Weight change: 0.553 kg Filed Weights   07/11/23 1039 07/12/23 0500 07/13/23 0500  Weight: 81.6 kg 82.4 kg 82.2 kg    Intake/Output: I/O last 3 completed shifts: In: 532.8 [P.O.:500; I.V.:32.8] Out: 1500 [Urine:1500]   Intake/Output this shift:  Total I/O In: 120 [P.O.:120] Out: 600 [Urine:600]  Physical Exam: General: NAD,   Head: Normocephalic, atraumatic. Moist oral mucosal membranes  Eyes: Anicteric, PERRL  Neck: Supple, trachea midline  Lungs:  2L 02, crackles to auscultation  Heart: Regular rate and rhythm  Abdomen:  Soft, nontender,   Extremities:  No peripheral edema.  Neurologic: Nonfocal, moving all four extremities  Skin: No lesions  Access: None    Basic Metabolic Panel: Recent Labs  Lab 07/11/23 1045 07/12/23 0422 07/13/23 0424  NA 137 134* 131*  K 4.6 4.2 3.3*  CL 104 98 94*  CO2 23 25 23   GLUCOSE 109* 129* 135*  BUN 87* 97* 112*  CREATININE 3.63* 3.68* 3.75*  CALCIUM  9.0 9.0 8.6*  MG  --  2.4  --     Liver Function Tests: No results for input(s): "AST", "ALT", "ALKPHOS", "BILITOT", "PROT", "ALBUMIN" in the last 168 hours. No results for input(s):  "LIPASE", "AMYLASE" in the last 168 hours. No results for input(s): "AMMONIA" in the last 168 hours.  CBC: Recent Labs  Lab 07/11/23 1045 07/12/23 0422 07/13/23 0424  WBC 17.1* 12.5* 11.4*  NEUTROABS  --  11.3* 10.0*  HGB 14.5 12.9* 12.2*  HCT 42.8 37.6* 34.5*  MCV 88.1 85.8 84.4  PLT 209 192 167    Cardiac Enzymes: No results for input(s): "CKTOTAL", "CKMB", "CKMBINDEX", "TROPONINI" in the last 168 hours.  BNP: Invalid input(s): "POCBNP"  CBG: No results for input(s): "GLUCAP" in the last 168 hours.  Microbiology: Results for orders placed or performed during the hospital encounter of 07/11/23  Blood culture (routine x 2)     Status: None (Preliminary result)   Collection Time: 07/11/23 10:45 AM   Specimen: BLOOD RIGHT FOREARM  Result Value Ref Range Status   Specimen Description BLOOD RIGHT FOREARM  Final   Special Requests   Final    BOTTLES DRAWN AEROBIC AND ANAEROBIC Blood Culture results may not be optimal due to an inadequate volume of blood received in culture bottles   Culture   Final    NO GROWTH 2 DAYS Performed at [facility], [address] [place], [place] [address]    Report Status PENDING  Incomplete  Blood culture (routine x 2)     Status: None (Preliminary result)   Collection Time: 07/11/23 10:45 AM   Specimen: BLOOD LEFT FOREARM  Result Value Ref Range Status  Specimen Description BLOOD LEFT FOREARM  Final   Special Requests   Final    BOTTLES DRAWN AEROBIC AND ANAEROBIC Blood Culture adequate volume   Culture   Final    NO GROWTH 2 DAYS Performed at [facility], [address], [place], [place] [address]    Report Status PENDING  Incomplete  Resp panel by RT-PCR (RSV, Flu A&B, Covid) Anterior Nasal Swab     Status: None   Collection Time: 07/11/23  1:53 PM   Specimen: Anterior Nasal Swab  Result Value Ref Range Status   SARS Coronavirus 2 by RT PCR NEGATIVE NEGATIVE Final    Comment: (NOTE) SARS-CoV-2 target  nucleic acids are NOT DETECTED.  The SARS-CoV-2 RNA is generally detectable in upper respiratory specimens during the acute phase of infection. The lowest concentration of SARS-CoV-2 viral copies this assay can detect is 138 copies/mL. A negative result does not preclude SARS-Cov-2 infection and should not be used as the sole basis for treatment or other patient management decisions. A negative result may occur with  improper specimen collection/handling, submission of specimen other than nasopharyngeal swab, presence of viral mutation(s) within the areas targeted by this assay, and inadequate number of viral copies(<138 copies/mL). A negative result must be combined with clinical observations, patient history, and epidemiological information. The expected result is Negative.  Fact Sheet for Patients:  BloggerCourse.com  Fact Sheet for Healthcare Providers:  SeriousBroker.it  This test is no t yet approved or cleared by the [place]  FDA and  has been authorized for detection and/or diagnosis of SARS-CoV-2 by FDA under an Emergency Use Authorization (EUA). This EUA will remain  in effect (meaning this test can be used) for the duration of the COVID-19 declaration under Section 564(b)(1) of the Act, 21 U.S.C.section 360bbb-3(b)(1), unless the authorization is terminated  or revoked sooner.       Influenza A by PCR NEGATIVE NEGATIVE Final   Influenza B by PCR NEGATIVE NEGATIVE Final    Comment: (NOTE) The Xpert Xpress SARS-CoV-2/FLU/RSV plus assay is intended as an aid in the diagnosis of influenza from Nasopharyngeal swab specimens and should not be used as a sole basis for treatment. Nasal washings and aspirates are unacceptable for Xpert Xpress SARS-CoV-2/FLU/RSV testing.  Fact Sheet for Patients: BloggerCourse.com  Fact Sheet for Healthcare  Providers: SeriousBroker.it  This test is not yet approved or cleared by the [place]  FDA and has been authorized for detection and/or diagnosis of SARS-CoV-2 by FDA under an Emergency Use Authorization (EUA). This EUA will remain in effect (meaning this test can be used) for the duration of the COVID-19 declaration under Section 564(b)(1) of the Act, 21 U.S.C. section 360bbb-3(b)(1), unless the authorization is terminated or revoked.     Resp Syncytial Virus by PCR NEGATIVE NEGATIVE Final    Comment: (NOTE) Fact Sheet for Patients: BloggerCourse.com  Fact Sheet for Healthcare Providers: SeriousBroker.it  This test is not yet approved or cleared by the [place]  FDA and has been authorized for detection and/or diagnosis of SARS-CoV-2 by FDA under an Emergency Use Authorization (EUA). This EUA will remain in effect (meaning this test can be used) for the duration of the COVID-19 declaration under Section 564(b)(1) of the Act, 21 U.S.C. section 360bbb-3(b)(1), unless the authorization is terminated or revoked.  Performed at [facility], [address], [place], [place] [address]     Coagulation Studies: Recent Labs    07/11/23 1045  LABPROT 15.9*  INR 1.2  Urinalysis: No results for input(s): "COLORURINE", "LABSPEC", "PHURINE", "GLUCOSEU", "HGBUR", "BILIRUBINUR", "KETONESUR", "PROTEINUR", "UROBILINOGEN", "NITRITE", "LEUKOCYTESUR" in the last 72 hours.  Invalid input(s): "APPERANCEUR"    Imaging: No results found.   Medications:    furosemide  (LASIX ) 200 mg in dextrose  5 % 100 mL (2 mg/mL) infusion 6 mg/hr (07/13/23 0554)    allopurinol  100 mg Oral Daily   amLODipine   5 mg Oral BID   apixaban   2.5 mg Oral BID   carvedilol   12.5 mg Oral BID WC   cholecalciferol  1,000 Units Oral Daily   cloNIDine   0.1 mg Oral BID   cyanocobalamin   1,000 mcg Oral Daily    hydrALAZINE   100 mg Oral TID   Ipratropium-Albuterol   1 puff Inhalation Q6H   isosorbide  dinitrate  40 mg Oral TID   metoprolol  succinate  25 mg Oral Daily   pantoprazole   40 mg Oral Daily   rosuvastatin   20 mg Oral Daily   sodium chloride  flush  3 mL Intravenous Q12H   acetaminophen , ondansetron  (ZOFRAN ) IV, mouth rinse, sodium chloride  flush  Assessment/ Plan:  [name] is a 73 y.o.  male with PMH of HFrEF,CAD, PVD, CVA, COPD, stage IV CKD who is admitted to [facility] on 06/21/2022 for SOB. CHF exacerbation [I50.9] Acute CHF (congestive heart failure) [I50.9] AKI (acute kidney injury) [N17.9] Acute renal failure superimposed on stage 4 chronic kidney disease, unspecified acute renal failure type [N17.9, N18.4]   Acute Kidney Injury on chronic kidney disease stage IV 2/2 to acute cardiorenal syndrome: baseline creatinine of 3.54, GFR of 18. 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 No acute indication for dialysis. History of intolerance to olmesartan. Avoid nephrotoxic agents. Strict intake and output. Judicious use of fluids. Will monitor renal indices.   Lab Results  Component Value Date   CREATININE 3.75 (H) 07/13/2023   CREATININE 3.68 (H) 07/12/2023   CREATININE 3.63 (H) 07/11/2023     Intake/Output Summary (Last 24 hours) at 07/13/2023 1246 Last data filed at 07/13/2023 1046 Gross per 24 hour  Intake 652.76 ml  Output 1700 ml  Net -1047.24 ml      Acute Respiratory distress 2/2 Acute COPD vs Acute exacerbation of chronic systolic and diastolic congestive heart failure.   -06/18/22 ECHO LV EF 50%-60%. Repeat ECHO pending results. Chest CT ordered -On 2L [place] .   3. Anemia with chronic kidney disease: 4/27 HgB 12.2 stable   Hypertension with chronic kidney disease. Currently on 40 mg isosorbide  dinitrate, metoprolol  25 mg daily, hydralazine  100 mg TID, amlodipine  5 mg, carvedilol  12.5 mg BID  Today BP 146/65  Secondary Hyperparathyroidism  04/27  Ca 8.6, Phos, PTH pending   LOS: 1 Ilamae Geng P Zania Kalisz 4/27/202512:19 PM

## 2023-07-13 NOTE — Progress Notes (Signed)
 Progress Note   Patient: Gary Mccoy. GLO:[id number] DOB: 02/01/1951 DOA: 07/11/2023     1 DOS: the patient was seen and examined on 07/13/2023   Brief hospital course: 73yo with h/o HFrEF, CAD/PAD, carotid stenosis, stage IV CKD, COPD, HLD, and CVA who presented on 4/25 with SOB.  He was placed on BIPAP and weaned to 3L [name], likely associated with CHF + COPD.  Diuresed with Lasix , echo pending, started on prednisone .    Assessment and Plan:  Cardiorenal syndrome with acute hypoxic respiratory failure Several days of acute shortness of breath, most consistent with acute on chronic heart failure, although initial concern for COPD exacerbation (appears unlikely currently) Required BIPAP transiently, currently on [place] O2 Increased WOB, wheezing, and cough this AM with nephrology - CT chest ordered; symptoms were resolved (spontaneously) by the time I saw him   Cardiorenal syndrome - stage IV CKD Patient's renal dysfunction appears to be stable, but he appears unable to effective balance CHF associated with volume overload, not improved with Lasix  Review of labs indicate consistently elevated creatinine with stage 4 CKD Nephrology consulted He is willing to start HD when recommended Changed to Lasix  drip at 6 mg/hr to try to effectively draw off fluid and continue to closely monitor   Cardiorenal syndrome - acute on chronic diastolic CHF Reports 13 pound weight gain despite Lasix , Zaroxolyn, and spironolactone Check echo Given IV Lasix  -> Lasix  infusion    HLD Continue Crestor    HTN Hold amlodipine  Continue carvedilol , clonidine , hydralazine , Toprol  XL    COPD  Initial concern for COPD exacerbation given shortness of breath and cough Suspect this is mostly driven by heart failure Continue Combivent No obvious current indication for ongoing steroids  CAD S/P percutaneous coronary angioplasty No chest pain reported at this time Troponin is elevated, however improved  compared to prior. Continue Eliquis    History of CVA and bilateral carotid artery stenosis (cerebrovascular accident) History of CVA with carotid artery stenosis, as well as extensive PAD PCP recently stopped Plavix  Continue Eliquis          Consultants: Nephrology PT OT TOC team   Procedures: Echocardiogram   Antibiotics: None    30 Day Unplanned Readmission Risk Score    Flowsheet Row ED to Hosp-Admission (Current) from 07/11/2023 in [facility] PCU  30 Day Unplanned Readmission Risk Score (%) 18.36 Filed at 07/13/2023 0401       This score is the patient's risk of an unplanned readmission within 30 days of being discharged (0 -100%). The score is based on dignosis, age, lab data, medications, orders, and past utilization.   Low:  0-14.9   Medium: 15-21.9   High: 22-29.9   Extreme: 30 and above           Subjective: He was wheezing and coughing significantly this AM when seen by nephrology but this resolved completely and spontaneously and he felt much better when I saw him.  Fluid is slowly coming off.  Breathing is better.   Objective: Vitals:   07/13/23 0806 07/13/23 1140  BP: (!) 166/84 (!) 146/65  Pulse: 69 69  Resp: 18 18  Temp: 97.7 F (36.5 C) (!) 97.5 F (36.4 C)  SpO2: 93% 95%    Intake/Output Summary (Last 24 hours) at 07/13/2023 1450 Last data filed at 07/13/2023 1046 Gross per 24 hour  Intake 652.76 ml  Output 1700 ml  Net -1047.24 ml   Filed Weights   07/11/23 1039 07/12/23 0500 07/13/23  0500  Weight: 81.6 kg 82.4 kg 82.2 kg    Exam:  General:  Appears calm and comfortable and is in NAD, up in bedside chair on [name] O2 Eyes:   EOMI, normal lids, iris ENT:  grossly normal hearing, lips & tongue, mmm Cardiovascular:  RRR, no m/r/g. 1+ LE edema, improving  Respiratory:   CTA bilaterally with no wheezes/rales/rhonchi.  Normal respiratory effort. Abdomen:  soft, NT, improving distention Skin:  no rash or induration seen  on limited exam Musculoskeletal:  grossly normal tone BUE/BLE, good ROM, no bony abnormality Psychiatric:  grossly normal mood and affect, speech fluent and appropriate, AOx3 Neurologic:  CN 2-12 grossly intact, moves all extremities in coordinated fashion   Data Reviewed: I have reviewed the patient's lab results since admission.  Pertinent labs for today include:   Na++ 131 K+ 3.3 Glucose 135 BUN 112/Creatinine 3.75/GFR 16 WBC 11.4 Hgb 12.2    Family Communication: None present  Disposition: Status is: Inpatient Remains inpatient appropriate because: ongoing management     Time spent: 50 minutes  Unresulted Labs (From admission, onward)     Start     Ordered   07/14/23 0500  CBC with Differential/Platelet  Tomorrow morning,   R        07/13/23 1450   07/14/23 0500  Basic metabolic panel with GFR  Tomorrow morning,   R        07/13/23 1450   07/13/23 1257  Parathyroid hormone, intact (no Ca)  Add-on,   AD        07/13/23 1256             Author: Lorita Rosa, MD 07/13/2023 2:50 PM  For on call review [email or web address].

## 2023-07-14 DIAGNOSIS — J9621 Acute and chronic respiratory failure with hypoxia: Secondary | ICD-10-CM | POA: Diagnosis not present

## 2023-07-14 LAB — CBC WITH DIFFERENTIAL/PLATELET
Abs Immature Granulocytes: 0.1 10*3/uL — ABNORMAL HIGH (ref 0.00–0.07)
Basophils Absolute: 0 10*3/uL (ref 0.0–0.1)
Basophils Relative: 0 %
Eosinophils Absolute: 0 10*3/uL (ref 0.0–0.5)
Eosinophils Relative: 0 %
HCT: 36.5 % — ABNORMAL LOW (ref 39.0–52.0)
Hemoglobin: 12.9 g/dL — ABNORMAL LOW (ref 13.0–17.0)
Immature Granulocytes: 1 %
Lymphocytes Relative: 5 %
Lymphs Abs: 0.4 10*3/uL — ABNORMAL LOW (ref 0.7–4.0)
MCH: 29.3 pg (ref 26.0–34.0)
MCHC: 35.3 g/dL (ref 30.0–36.0)
MCV: 83 fL (ref 80.0–100.0)
Monocytes Absolute: 0.3 10*3/uL (ref 0.1–1.0)
Monocytes Relative: 4 %
Neutro Abs: 7.3 10*3/uL (ref 1.7–7.7)
Neutrophils Relative %: 90 %
Platelets: 182 10*3/uL (ref 150–400)
RBC: 4.4 MIL/uL (ref 4.22–5.81)
RDW: 14.6 % (ref 11.5–15.5)
WBC: 8.1 10*3/uL (ref 4.0–10.5)
nRBC: 0 % (ref 0.0–0.2)

## 2023-07-14 LAB — BASIC METABOLIC PANEL WITH GFR
Anion gap: 15 (ref 5–15)
BUN: 135 mg/dL — ABNORMAL HIGH (ref 8–23)
CO2: 24 mmol/L (ref 22–32)
Calcium: 8.7 mg/dL — ABNORMAL LOW (ref 8.9–10.3)
Chloride: 93 mmol/L — ABNORMAL LOW (ref 98–111)
Creatinine, Ser: 3.93 mg/dL — ABNORMAL HIGH (ref 0.61–1.24)
GFR, Estimated: 16 mL/min — ABNORMAL LOW (ref >60)
Glucose, Bld: 138 mg/dL — ABNORMAL HIGH (ref 70–99)
Potassium: 3.2 mmol/L — ABNORMAL LOW (ref 3.5–5.1)
Sodium: 132 mmol/L — ABNORMAL LOW (ref 135–145)

## 2023-07-14 MED ORDER — POTASSIUM CHLORIDE CRYS ER 20 MEQ PO TBCR
40.0000 meq | EXTENDED_RELEASE_TABLET | Freq: Once | ORAL | Status: AC
  Administered 2023-07-14: 40 meq via ORAL
  Filled 2023-07-14: qty 2

## 2023-07-14 NOTE — TOC Initial Note (Signed)
 Transition of Care (TOC) - Initial/Assessment Note    Patient Details  Name: Gary Mccoy. MRN: [id number] Date of Birth: 20-Jun-1950  Transition of Care [facility]) CM/SW Contact:    Odilia Bennett, LCSW Phone Number: 07/14/2023, 1:04 PM  Clinical Narrative:   Readmission prevention screen complete. CSW met with patient. No family at bedside. CSW introduced role and explained that discharge planning would be discussed. PCP is Firman Hughes, MD. Patient drives himself to appointments. [facility] [address] Otherwise he uses the [facility]. No issues obtaining medications. Patient lives home with his wife. No home health prior to admission. PT evaluated and determined no follow up needs. No DME use prior to admission. He does have oxygen that he discharged with a year ago through [facility]. He reports only using it about a week and then again two days prior to admission. No further concerns. CSW will continue to follow patient for support and facilitate return home once stable. Wife will transport him home at discharge.               Expected Discharge Plan: Home/Self Care Barriers to Discharge: Continued Medical Work up   Patient Goals and CMS Choice            Expected Discharge Plan and Services     Post Acute Care Choice: NA Living arrangements for the past 2 months: Single Family Home                                      Prior Living Arrangements/Services Living arrangements for the past 2 months: Single Family Home Lives with:: Spouse Patient language and need for interpreter reviewed:: Yes Do you feel safe going back to the place where you live?: Yes      Need for Family Participation in Patient Care: Yes (Comment) Care giver support system in place?: Yes (comment) Current home services: DME Criminal Activity/Legal Involvement Pertinent to Current Situation/Hospitalization: No - Comment as  needed  Activities of Daily Living   ADL Screening (condition at time of admission) Independently performs ADLs?: Yes (appropriate for developmental age) Is the patient deaf or have difficulty hearing?: No Does the patient have difficulty seeing, even when wearing glasses/contacts?: No Does the patient have difficulty concentrating, remembering, or making decisions?: No  Permission Sought/Granted                  Emotional Assessment Appearance:: Appears stated age Attitude/Demeanor/Rapport: Engaged Affect (typically observed): Accepting, Appropriate, Calm Orientation: : Oriented to Self, Oriented to Place, Oriented to  Time, Oriented to Situation Alcohol / Substance Use: Not Applicable Psych Involvement: No (comment)  Admission diagnosis:  COPD exacerbation (HCC) [J44.1] COPD with acute exacerbation (HCC) [J44.1] Stage 4 chronic kidney disease (HCC) [N18.4] Cardiorenal syndrome [I13.10] Patient Active Problem List   Diagnosis Date Noted   Cardiorenal syndrome 07/12/2023   COPD with acute exacerbation (HCC) 07/11/2023   Acute CHF (congestive heart failure) (HCC) 06/18/2022   History of CVA and bilateral carotid artery stenosis (cerebrovascular accident) 06/17/2022   Chronic anticoagulation 06/17/2022   CAD S/P percutaneous coronary angioplasty 06/17/2022   Ischemic cardiomyopathy 06/17/2022   Unstable angina (HCC) 06/17/2022   Hypertensive emergency 06/17/2022   Acute exacerbation of CHF (congestive heart failure) (HCC) 10/29/2021   CKD (chronic kidney disease) stage 4, GFR 15-29 ml/min (HCC) 10/29/2021   Acute renal failure superimposed  on stage 4 chronic kidney disease (HCC) 10/29/2021   Atherosclerosis of native arteries of extremity with intermittent claudication (HCC) 04/16/2021   Acute on chronic systolic CHF (congestive heart failure) (HCC) 01/31/2021   Melena 01/30/2021   Anemia 01/30/2021   Shortness of breath 01/30/2021   Acute on chronic respiratory failure  with hypoxia (HCC) 01/29/2021   CVA (cerebral vascular accident) (HCC) 02/26/2018   Tubular adenoma 10/29/2016   Bilateral carotid artery stenosis 04/10/2016   Hyperlipidemia 01/01/2016   B12 deficiency 12/29/2015   Weakness of right lower extremity 09/19/2015   Tobacco abuse counseling 09/19/2015   PAD s/p aortobifemoral bypass 2017 (peripheral artery disease) 09/19/2015   Leukocytosis 09/19/2015   Cerebral infarction (HCC) 09/19/2015   Abnormal findings-gastrointestinal tract    Gastritis    Duodenal ulcer disease    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 08/29/2015   Contact with and (suspected ) exposure to other hazardous substances 01/27/2014   Exposure to hazardous material 01/27/2014   Degeneration of intervertebral disc of lumbar region 11/12/2013   Degeneration of intervertebral disc of lumbosacral region 11/12/2013   Neuritis or radiculitis due to rupture of lumbar intervertebral disc 11/12/2013   Thoracic neuritis 11/12/2013   Benign essential HTN 02/01/2013   PCP:  Sari Cunning, MD Pharmacy:   [facility] [phone] [place], [facility] - [address] [place] [address] Phone: [phone] Fax: [phone]  [facility] - [place], [place] - [address] [place] [address] Phone: [phone] Fax: [phone]     Social Drivers of Health (SDOH) Social History: SDOH Screenings   Food Insecurity: No Food Insecurity (07/11/2023)  Housing: Low Risk  (07/11/2023)  Transportation Needs: No Transportation Needs (07/11/2023)  Utilities: Not At Risk (07/11/2023)  Depression (PHQ2-9): Low Risk  (11/23/2021)  Financial Resource Strain: Low Risk  (02/20/2023)   Received from [facility]  Social Connections: Moderately Integrated (07/11/2023)  Tobacco Use: High Risk (07/11/2023)   SDOH Interventions:     Readmission Risk Interventions    07/14/2023    1:03 PM  Readmission Risk Prevention Plan   Transportation Screening Complete  PCP or Specialist Appt within 3-5 Days Complete  Social Work Consult for Recovery Care Planning/Counseling Complete  Palliative Care Screening Not Applicable  Medication Review Oceanographer) Complete

## 2023-07-14 NOTE — Progress Notes (Signed)
 Progress Note   Patient: Gary Mccoy. EAV:[id number] DOB: 05-28-1950 DOA: 07/11/2023     2 DOS: the patient was seen and examined on 07/14/2023   Brief hospital course: 72yo with h/o HFrEF, CAD/PAD, carotid stenosis, stage IV CKD, COPD, HLD, and CVA who presented on 4/25 with SOB.  He was placed on BIPAP and weaned to 3L [place] O2, likely associated with CHF + COPD.  Diuresed with Lasix , echo pending, started on prednisone .    Assessment and Plan:  Cardiorenal syndrome with acute hypoxic respiratory failure Several days of acute shortness of breath, most consistent with acute on chronic heart failure, although initial concern for COPD exacerbation  Required BIPAP transiently, currently on [place] O2 Periodically increased WOB, wheezing, and cough but this was again resolved when I saw him today   Cardiorenal syndrome - stage IV CKD Patient's renal dysfunction appears to be stable, but he appears unable to effectively balance CHF associated with volume overload, not improved with Lasix  Review of labs indicate consistently elevated creatinine with stage 4 CKD GFR 20 in July, then 18, currently 16 Nephrology consulted He is willing to start HD when recommended Changed to Lasix  drip at 6 mg/hr to try to effectively draw off fluid and continue to closely monitor Appears euvolvemic, Lasix  drip stopped this AM Needs ongoing renal function monitoring   Cardiorenal syndrome - acute on chronic combined CHF Reports 13 pound weight gain despite Lasix , Zaroxolyn, and spironolactone Echo with EF 45-50%, global hypokinesis, moderate to severe LV dilatation, grade 1 DD Given IV Lasix  -> Lasix  infusion Now effectively diuresed    HLD Continue Crestor    HTN Hold amlodipine  Continue carvedilol , clonidine , hydralazine , Toprol  XL    COPD  Initial concern for COPD exacerbation given shortness of breath and cough Suspect this is mostly driven by heart failure Continue Combivent Resumed  prednisone  No current wheezing noted   CAD S/P percutaneous coronary angioplasty No chest pain reported at this time Troponin is elevated, however improved compared to prior Continue Eliquis    History of CVA and bilateral carotid artery stenosis (cerebrovascular accident) History of CVA with carotid artery stenosis, as well as extensive PAD PCP recently stopped Plavix  Continue Eliquis          Consultants: Nephrology PT OT [facility] team   Procedures: Echocardiogram   Antibiotics: None    30 Day Unplanned Readmission Risk Score    Flowsheet Row ED to Hosp-Admission (Current) from 07/11/2023 in [facility]  30 Day Unplanned Readmission Risk Score (%) 25.05 Filed at 07/14/2023 0401       This score is the patient's risk of an unplanned readmission within 30 days of being discharged (0 -100%). The score is based on dignosis, age, lab data, medications, orders, and past utilization.   Low:  0-14.9   Medium: 15-21.9   High: 22-29.9   Extreme: 30 and above           Subjective: He was very angry today when I saw him.  Frustrated that the issues were not diagnosed earlier in his life, frustrated that dialysis is being considered, considering not doing HD and transitioning to hospice.  Overall, very angry and different from prior days.   Objective: Vitals:   07/14/23 0839 07/14/23 1229  BP: (!) 174/71 (!) 159/77  Pulse: 66 65  Resp:    Temp: 97.7 F (36.5 C) 97.8 F (36.6 C)  SpO2: 96% 97%    Intake/Output Summary (Last 24 hours) at 07/14/2023 1437 Last  data filed at 07/14/2023 0900 Gross per 24 hour  Intake 320 ml  Output 1400 ml  Net -1080 ml   Filed Weights   07/12/23 0500 07/13/23 0500 07/14/23 0500  Weight: 82.4 kg 82.2 kg 82.6 kg    Exam:  General:  Appears calm and comfortable and is in NAD, on [place] O2 Eyes:   EOMI, normal lids, iris ENT:  grossly normal hearing, lips & tongue, mmm Cardiovascular:  RRR, no m/r/g. No LE  edema Respiratory:   CTA bilaterally with no wheezes/rales/rhonchi.  Normal respiratory effort. Abdomen:  soft, NT, improving distention Skin:  no rash or induration seen on limited exam Musculoskeletal:  grossly normal tone BUE/BLE, good ROM, no bony abnormality Psychiatric:  angry, agitated mood and affect, speech fluent and appropriate, AOx3 Neurologic:  CN 2-12 grossly intact, moves all extremities in coordinated fashion  Data Reviewed: I have reviewed the patient's lab results since admission.  Pertinent labs for today include:   Na++ 132, stable K+ 3.2 BUN 135/Creatinine 3.93/GFR 16 WBC 8.1 Hgb 12.9     Family Communication: Wife was present throughout evaluation  Disposition: Status is: Inpatient Remains inpatient appropriate because: ongoing monitoring     Time spent: 50 minutes  Unresulted Labs (From admission, onward)     Start     Ordered   07/15/23 0500  CBC with Differential/Platelet  Tomorrow morning,   R        07/14/23 1437   07/15/23 0500  Basic metabolic panel with GFR  Tomorrow morning,   R        07/14/23 1437   07/13/23 1257  Parathyroid hormone, intact (no Ca)  Add-on,   AD        07/13/23 1256             Author: Lorita Rosa, MD 07/14/2023 2:37 PM  For on call review [email or web address].

## 2023-07-14 NOTE — Progress Notes (Signed)
 [facility]  ROUNDING NOTE   Subjective:  [name] was admitted to [facility] on 06/21/2023 for shortness of breath with suspect of CHF vs COPD exacerbation. Patient reports increase weight gain of 13lbs and states his concern for repeat of previous CHF exacerbation.   Patient is followed by Dr Zelda Hickman outpatient.   Update: He appears drowsy this morning, states this started yesterday evening Also reports most food lacks taste, has bitter/metallic taste in his mouth  Seen later in morning, appears agitated.   Creatinine 3.93 BUN 135 UOP 2L   Objective:  Vital signs in last 24 hours:  Temp:  [97.5 F (36.4 C)-98 F (36.7 C)] 97.7 F (36.5 C) (04/28 0839) Pulse Rate:  [64-68] 66 (04/28 0839) Resp:  [14-19] 19 (04/28 0400) BP: (150-174)/(71-82) 174/71 (04/28 0839) SpO2:  [94 %-97 %] 96 % (04/28 0839) Weight:  [82.6 kg] 82.6 kg (04/28 0500)  Weight change: 0.4 kg Filed Weights   07/12/23 0500 07/13/23 0500 07/14/23 0500  Weight: 82.4 kg 82.2 kg 82.6 kg    Intake/Output: I/O last 3 completed shifts: In: 852.8 [P.O.:820; I.V.:32.8] Out: 3100 [Urine:3100]   Intake/Output this shift:  Total I/O In: 120 [P.O.:120] Out: -   Physical Exam: General: NAD,   Head: Normocephalic, atraumatic. Moist oral mucosal membranes  Eyes: Anicteric  Lungs:  2L 02, clear to auscultation  Heart: Regular rate and rhythm  Abdomen:  Soft, nontender,   Extremities:  No peripheral edema.  Neurologic: Alert, moving all four extremities  Skin: No lesions  Access: None    Basic Metabolic Panel: Recent Labs  Lab 07/11/23 1045 07/12/23 0422 07/13/23 0421 07/13/23 0424 07/14/23 0430  NA 137 134*  --  131* 132*  K 4.6 4.2  --  3.3* 3.2*  CL 104 98  --  94* 93*  CO2 23 25  --  23 24  GLUCOSE 109* 129*  --  135* 138*  BUN 87* 97*  --  112* 135*  CREATININE 3.63* 3.68*  --  3.75* 3.93*  CALCIUM  9.0 9.0  --  8.6* 8.7*  MG  --  2.4  --   --   --   PHOS  --   --   6.6*  --   --     Liver Function Tests: No results for input(s): "AST", "ALT", "ALKPHOS", "BILITOT", "PROT", "ALBUMIN" in the last 168 hours. No results for input(s): "LIPASE", "AMYLASE" in the last 168 hours. No results for input(s): "AMMONIA" in the last 168 hours.  CBC: Recent Labs  Lab 07/11/23 1045 07/12/23 0422 07/13/23 0424 07/14/23 0430  WBC 17.1* 12.5* 11.4* 8.1  NEUTROABS  --  11.3* 10.0* 7.3  HGB 14.5 12.9* 12.2* 12.9*  HCT 42.8 37.6* 34.5* 36.5*  MCV 88.1 85.8 84.4 83.0  PLT 209 192 167 182    Cardiac Enzymes: No results for input(s): "CKTOTAL", "CKMB", "CKMBINDEX", "TROPONINI" in the last 168 hours.  BNP: Invalid input(s): "POCBNP"  CBG: No results for input(s): "GLUCAP" in the last 168 hours.  Microbiology: Results for orders placed or performed during the hospital encounter of 07/11/23  Blood culture (routine x 2)     Status: None (Preliminary result)   Collection Time: 07/11/23 10:45 AM   Specimen: BLOOD RIGHT FOREARM  Result Value Ref Range Status   Specimen Description BLOOD RIGHT FOREARM  Final   Special Requests   Final    BOTTLES DRAWN AEROBIC AND ANAEROBIC Blood Culture results may not be optimal due  to an inadequate volume of blood received in culture bottles   Culture   Final    NO GROWTH 3 DAYS Performed at [facility], [address] [place], [place] [address]    Report Status PENDING  Incomplete  Blood culture (routine x 2)     Status: None (Preliminary result)   Collection Time: 07/11/23 10:45 AM   Specimen: BLOOD LEFT FOREARM  Result Value Ref Range Status   Specimen Description BLOOD LEFT FOREARM  Final   Special Requests   Final    BOTTLES DRAWN AEROBIC AND ANAEROBIC Blood Culture adequate volume   Culture   Final    NO GROWTH 3 DAYS Performed at [facility], [address] [place], [place] [address]    Report Status PENDING  Incomplete  Resp panel by RT-PCR (RSV, Flu A&B, Covid) Anterior Nasal Swab      Status: None   Collection Time: 07/11/23  1:53 PM   Specimen: Anterior Nasal Swab  Result Value Ref Range Status   SARS Coronavirus 2 by RT PCR NEGATIVE NEGATIVE Final    Comment: (NOTE) SARS-CoV-2 target nucleic acids are NOT DETECTED.  The SARS-CoV-2 RNA is generally detectable in upper respiratory specimens during the acute phase of infection. The lowest concentration of SARS-CoV-2 viral copies this assay can detect is 138 copies/mL. A negative result does not preclude SARS-Cov-2 infection and should not be used as the sole basis for treatment or other patient management decisions. A negative result may occur with  improper specimen collection/handling, submission of specimen other than nasopharyngeal swab, presence of viral mutation(s) within the areas targeted by this assay, and inadequate number of viral copies(<138 copies/mL). A negative result must be combined with clinical observations, patient history, and epidemiological information. The expected result is Negative.  Fact Sheet for Patients:  BloggerCourse.com  Fact Sheet for Healthcare Providers:  SeriousBroker.it  This test is no t yet approved or cleared by the [place]  FDA and  has been authorized for detection and/or diagnosis of SARS-CoV-2 by FDA under an Emergency Use Authorization (EUA). This EUA will remain  in effect (meaning this test can be used) for the duration of the COVID-19 declaration under Section 564(b)(1) of the Act, 21 U.S.C.section 360bbb-3(b)(1), unless the authorization is terminated  or revoked sooner.       Influenza A by PCR NEGATIVE NEGATIVE Final   Influenza B by PCR NEGATIVE NEGATIVE Final    Comment: (NOTE) The Xpert Xpress SARS-CoV-2/FLU/RSV plus assay is intended as an aid in the diagnosis of influenza from Nasopharyngeal swab specimens and should not be used as a sole basis for treatment. Nasal washings and aspirates are  unacceptable for Xpert Xpress SARS-CoV-2/FLU/RSV testing.  Fact Sheet for Patients: BloggerCourse.com  Fact Sheet for Healthcare Providers: SeriousBroker.it  This test is not yet approved or cleared by the [place]  FDA and has been authorized for detection and/or diagnosis of SARS-CoV-2 by FDA under an Emergency Use Authorization (EUA). This EUA will remain in effect (meaning this test can be used) for the duration of the COVID-19 declaration under Section 564(b)(1) of the Act, 21 U.S.C. section 360bbb-3(b)(1), unless the authorization is terminated or revoked.     Resp Syncytial Virus by PCR NEGATIVE NEGATIVE Final    Comment: (NOTE) Fact Sheet for Patients: BloggerCourse.com  Fact Sheet for Healthcare Providers: SeriousBroker.it  This test is not yet approved or cleared by the [place]  FDA and has been authorized for detection and/or diagnosis of SARS-CoV-2 by  FDA under an Emergency Use Authorization (EUA). This EUA will remain in effect (meaning this test can be used) for the duration of the COVID-19 declaration under Section 564(b)(1) of the Act, 21 U.S.C. section 360bbb-3(b)(1), unless the authorization is terminated or revoked.  Performed at [facility], [address] [place], [place] [address]     Coagulation Studies: No results for input(s): "LABPROT", "INR" in the last 72 hours.   Urinalysis: No results for input(s): "COLORURINE", "LABSPEC", "PHURINE", "GLUCOSEU", "HGBUR", "BILIRUBINUR", "KETONESUR", "PROTEINUR", "UROBILINOGEN", "NITRITE", "LEUKOCYTESUR" in the last 72 hours.  Invalid input(s): "APPERANCEUR"    Imaging: CT CHEST WO CONTRAST Result Date: 07/13/2023 CLINICAL DATA:  shortness of breath, unclear etiology EXAM: CT CHEST WITHOUT CONTRAST TECHNIQUE: Multidetector CT imaging of the chest was performed following the standard  protocol without IV contrast. RADIATION DOSE REDUCTION: This exam was performed according to the departmental dose-optimization program which includes automated exposure control, adjustment of the mA and/or kV according to patient size and/or use of iterative reconstruction technique. COMPARISON:  07/11/2023 FINDINGS: Cardiovascular: Unenhanced imaging of the heart is unremarkable without pericardial effusion. Normal caliber of the thoracic aorta. Atherosclerosis of the aorta and coronary vasculature. Assessment of the vascular lumen cannot be performed without IV contrast. Mediastinum/Nodes: No enlarged mediastinal or axillary lymph nodes. Thyroid  gland, trachea, and esophagus demonstrate no significant findings. Lungs/Pleura: Trace bilateral pleural effusions, right greater than left. Mild biapical paraseptal emphysema. Bilateral bronchial wall thickening, greatest at the lung bases, compatible with bronchitis or reactive airway disease. No airspace disease or pneumothorax. Upper Abdomen: Small calcified gallstones without evidence of acute cholecystitis. Right renal cortical atrophy. Musculoskeletal: No acute or destructive bony abnormalities. Reconstructed images demonstrate no additional findings. IMPRESSION: 1. Bilateral bronchial wall thickening, greatest at the bases, consistent with bronchitis or reactive airway disease. 2. Trace bilateral pleural effusions. 3. Mild biapical emphysema. 4. Aortic Atherosclerosis (ICD10-I70.0). Coronary artery atherosclerosis. 5. Incidental cholelithiasis without evidence of cholecystitis. Electronically Signed   By: Bobbye Burrow M.D.   On: 07/13/2023 15:22   ECHOCARDIOGRAM COMPLETE Result Date: 07/13/2023    ECHOCARDIOGRAM REPORT   Patient Name:   Gary Mccoy. Date of Exam: 07/12/2023 Medical Rec #:  [id number]             Height:       73.0 in Accession #:    [id number]            Weight:       181.7 lb Date of Birth:  15-Dec-1950             BSA:          2.065  m Patient Age:    72 years              BP:           171/82 mmHg Patient Gender: M                     HR:           72 bpm. Exam Location:  Inpatient Procedure: 2D Echo (Both Spectral and Color Flow Doppler were utilized during            procedure). Indications:     acute diastolic chf  History:         Patient has prior history of Echocardiogram examinations, most                  recent 06/18/2022. CAD, PAD, COPD and  chronic kidney disease,                  Signs/Symptoms:Shortness of Breath; Risk Factors:Current                  Smoker.  Sonographer:     Dione Franks RDCS Referring Phys:  [id number] Avi Body Diagnosing Phys: Antonette Batters MD  Sonographer Comments: Image acquisition challenging due to COPD. IMPRESSIONS  1. Left ventricular ejection fraction, by estimation, is 45 to 50%. The left ventricle has mildly decreased function. The left ventricle demonstrates global hypokinesis. The left ventricular internal cavity size was moderately to severely dilated. Left ventricular diastolic parameters are consistent with Grade I diastolic dysfunction (impaired relaxation).  2. Right ventricular systolic function is normal. The right ventricular size is normal. Mildly increased right ventricular wall thickness.  3. The mitral valve is normal in structure. No evidence of mitral valve regurgitation.  4. The aortic valve is normal in structure. Aortic valve regurgitation is trivial. FINDINGS  Left Ventricle: Left ventricular ejection fraction, by estimation, is 45 to 50%. The left ventricle has mildly decreased function. The left ventricle demonstrates global hypokinesis. Strain was performed and the global longitudinal strain is indeterminate. Global longitudinal strain performed but not reported based on interpreter judgement due to suboptimal tracking. The left ventricular internal cavity size was moderately to severely dilated. There is borderline concentric left ventricular hypertrophy. Left  ventricular diastolic parameters are consistent with Grade I diastolic dysfunction (impaired relaxation). Right Ventricle: The right ventricular size is normal. Mildly increased right ventricular wall thickness. Right ventricular systolic function is normal. Left Atrium: Left atrial size was normal in size. Right Atrium: Right atrial size was normal in size. Pericardium: There is no evidence of pericardial effusion. Mitral Valve: The mitral valve is normal in structure. No evidence of mitral valve regurgitation. Tricuspid Valve: The tricuspid valve is normal in structure. Tricuspid valve regurgitation is trivial. Aortic Valve: The aortic valve is normal in structure. Aortic valve regurgitation is trivial. Pulmonic Valve: The pulmonic valve was normal in structure. Pulmonic valve regurgitation is not visualized. Aorta: The ascending aorta was not well visualized. IAS/Shunts: No atrial level shunt detected by color flow Doppler. Additional Comments: 3D was performed not requiring image post processing on an independent workstation and was indeterminate.  LEFT VENTRICLE PLAX 2D LVIDd:         5.80 cm      Diastology LVIDs:         4.40 cm      LV e' medial:    4.24 cm/s LV PW:         1.00 cm      LV E/e' medial:  15.7 LV IVS:        1.10 cm      LV e' lateral:   6.09 cm/s LVOT diam:     2.30 cm      LV E/e' lateral: 10.9 LV SV:         86 LV SV Index:   42 LVOT Area:     4.15 cm  LV Volumes (MOD) LV vol d, MOD A4C: 113.0 ml LV vol s, MOD A4C: 61.6 ml LV SV MOD A4C:     113.0 ml RIGHT VENTRICLE             IVC RV Basal diam:  3.20 cm     IVC diam: 2.00 cm RV S prime:     13.20 cm/s TAPSE (M-mode): 2.6 cm LEFT ATRIUM  Index        RIGHT ATRIUM           Index LA diam:        4.10 cm 1.99 cm/m   RA Area:     13.70 cm LA Vol (A2C):   76.5 ml 37.04 ml/m  RA Volume:   35.60 ml  17.24 ml/m LA Vol (A4C):   51.0 ml 24.69 ml/m LA Biplane Vol: 65.4 ml 31.66 ml/m  AORTIC VALVE LVOT Vmax:   97.50 cm/s LVOT Vmean:   65.000 cm/s LVOT VTI:    0.207 m  AORTA Ao Root diam: 3.40 cm Ao Asc diam:  2.90 cm MITRAL VALVE MV Area (PHT): 3.72 cm     SHUNTS MV Decel Time: 204 msec     Systemic VTI:  0.21 m MV E velocity: 66.60 cm/s   Systemic Diam: 2.30 cm MV A velocity: 105.00 cm/s MV E/A ratio:  0.63 Dwayne D Callwood MD Electronically signed by Antonette Batters MD Signature Date/Time: 07/13/2023/12:50:00 PM    Final      Medications:      allopurinol  100 mg Oral Daily   amLODipine   5 mg Oral BID   apixaban   2.5 mg Oral BID   carvedilol   12.5 mg Oral BID WC   cholecalciferol  1,000 Units Oral Daily   cloNIDine   0.1 mg Oral BID   cyanocobalamin   1,000 mcg Oral Daily   hydrALAZINE   100 mg Oral TID   Ipratropium-Albuterol   1 puff Inhalation Q6H   isosorbide  dinitrate  40 mg Oral TID   metoprolol  succinate  25 mg Oral Daily   pantoprazole   40 mg Oral Daily   predniSONE   40 mg Oral Q breakfast   rosuvastatin   20 mg Oral Daily   sodium chloride  flush  3 mL Intravenous Q12H   acetaminophen , albuterol , ondansetron  (ZOFRAN ) IV, mouth rinse, sodium chloride  flush  Assessment/ Plan:  [name] is a 73 y.o.  male with PMH of HFrEF,CAD, PVD, CVA, COPD, stage IV CKD who is admitted to [facility] on 06/21/2022 for COPD exacerbation (HCC) [J44.1] COPD with acute exacerbation (HCC) [J44.1] Stage 4 chronic kidney disease (HCC) [N18.4] Cardiorenal syndrome [I13.10]  Acute Kidney Injury on chronic kidney disease stage IV 2/2 to acute cardiorenal syndrome: baseline creatinine of 3.54, GFR of 18. 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  Creatinine continues to rise along with BUN. Patient no longer appears fluid overloaded. Will stop Furosemide  drip and monitor. Will avoid IVF for now. No acute indication for dialysis.   Lab Results  Component Value Date   CREATININE 3.93 (H) 07/14/2023   CREATININE 3.75 (H) 07/13/2023   CREATININE 3.68 (H) 07/12/2023     Intake/Output Summary (Last 24  hours) at 07/14/2023 1221 Last data filed at 07/14/2023 0900 Gross per 24 hour  Intake 320 ml  Output 1400 ml  Net -1080 ml      Acute Respiratory distress 2/2 Acute COPD vs Acute exacerbation of chronic systolic and diastolic congestive heart failure.   -06/18/22 ECHO LV EF 50%-60%. Repeat ECHO pending results. Chest CT ordered -On 2L [place] - Furosemide  drip stopped .   3. Anemia with chronic kidney disease: 4/27 HgB 12.9.    Hypertension with chronic kidney disease. Currently on 40 mg isosorbide  dinitrate, metoprolol  25 mg daily, hydralazine  100 mg TID, amlodipine  5 mg, carvedilol  12.5 mg BID  BP elevated this morning, 174/71  Secondary Hyperparathyroidism  04/27 Ca 8.6, Phos, PTH  pending   LOS: 2 Hannie Shoe 4/28/202512:21 PM

## 2023-07-14 NOTE — Plan of Care (Signed)

## 2023-07-15 DIAGNOSIS — J9621 Acute and chronic respiratory failure with hypoxia: Secondary | ICD-10-CM | POA: Diagnosis not present

## 2023-07-15 LAB — BASIC METABOLIC PANEL WITH GFR
Anion gap: 12 (ref 5–15)
BUN: 139 mg/dL — ABNORMAL HIGH (ref 8–23)
CO2: 25 mmol/L (ref 22–32)
Calcium: 8.6 mg/dL — ABNORMAL LOW (ref 8.9–10.3)
Chloride: 92 mmol/L — ABNORMAL LOW (ref 98–111)
Creatinine, Ser: 4.5 mg/dL — ABNORMAL HIGH (ref 0.61–1.24)
GFR, Estimated: 13 mL/min — ABNORMAL LOW (ref >60)
Glucose, Bld: 119 mg/dL — ABNORMAL HIGH (ref 70–99)
Potassium: 3.3 mmol/L — ABNORMAL LOW (ref 3.5–5.1)
Sodium: 129 mmol/L — ABNORMAL LOW (ref 135–145)

## 2023-07-15 LAB — CBC WITH DIFFERENTIAL/PLATELET
Abs Immature Granulocytes: 0.2 10*3/uL — ABNORMAL HIGH (ref 0.00–0.07)
Basophils Absolute: 0 10*3/uL (ref 0.0–0.1)
Basophils Relative: 0 %
Eosinophils Absolute: 0 10*3/uL (ref 0.0–0.5)
Eosinophils Relative: 0 %
HCT: 34.6 % — ABNORMAL LOW (ref 39.0–52.0)
Hemoglobin: 12.3 g/dL — ABNORMAL LOW (ref 13.0–17.0)
Immature Granulocytes: 2 %
Lymphocytes Relative: 5 %
Lymphs Abs: 0.6 10*3/uL — ABNORMAL LOW (ref 0.7–4.0)
MCH: 29.4 pg (ref 26.0–34.0)
MCHC: 35.5 g/dL (ref 30.0–36.0)
MCV: 82.8 fL (ref 80.0–100.0)
Monocytes Absolute: 1.1 10*3/uL — ABNORMAL HIGH (ref 0.1–1.0)
Monocytes Relative: 9 %
Neutro Abs: 9.6 10*3/uL — ABNORMAL HIGH (ref 1.7–7.7)
Neutrophils Relative %: 84 %
Platelets: 175 10*3/uL (ref 150–400)
RBC: 4.18 MIL/uL — ABNORMAL LOW (ref 4.22–5.81)
RDW: 14.7 % (ref 11.5–15.5)
WBC: 11.5 10*3/uL — ABNORMAL HIGH (ref 4.0–10.5)
nRBC: 0 % (ref 0.0–0.2)

## 2023-07-15 LAB — PARATHYROID HORMONE, INTACT (NO CA): PTH: 124 pg/mL — ABNORMAL HIGH (ref 15–65)

## 2023-07-15 NOTE — Progress Notes (Signed)
 Progress Note   Patient: Gary Mccoy. ZOX:[id number] DOB: 05/16/1950 DOA: 07/11/2023     3 DOS: the patient was seen and examined on 07/15/2023   Brief hospital course: 73yo with h/o HFrEF, CAD/PAD, carotid stenosis, stage IV CKD, COPD, HLD, and CVA who presented on 4/25 with SOB.  He was placed on BIPAP and weaned to 3L [place] O2, likely associated with CHF + COPD.  Diuresed with Lasix , echo pending, started on prednisone .    Assessment and Plan:  Cardiorenal syndrome with acute hypoxic respiratory failure Several days of acute shortness of breath, most consistent with acute on chronic heart failure +/- bronchitis/COPD exacerbation  Symptoms worsened despite Levaquin and steroids x 4 days PTA Required BIPAP transiently on presentation, weaned to [place] O2 Periodically increased WOB, wheezing, and cough    Cardiorenal syndrome - stage IV CKD Patient's renal dysfunction appears to be stable, but he appears unable to effectively balance CHF associated with volume overload, not improved with Lasix  Review of labs indicate consistently elevated creatinine with stage 4 CKD GFR 20 in July, then 18, currently 13 Nephrology consulted He is willing to start HD when recommended Changed to Lasix  drip at 6 mg/hr to try to effectively draw off fluid and continue to closely monitor Appears euvolvemic, Lasix  drip stopped 4/28  Renal function continues to worsen Needs ongoing renal function monitoring   Cardiorenal syndrome - acute on chronic combined CHF Reports 13 pound weight gain despite Lasix , Zaroxolyn, and spironolactone Echo with EF 45-50%, global hypokinesis, moderate to severe LV dilatation, grade 1 DD Given IV Lasix  -> Lasix  infusion Now effectively diuresed    HLD Continue Crestor    HTN Hold amlodipine  Continue carvedilol , clonidine , hydralazine , Toprol  XL    COPD  Initial concern for COPD exacerbation given shortness of breath and cough Suspect this is mostly driven by heart  failure Continue Combivent Resumed prednisone  Albuterol  q2h prn No current wheezing noted   CAD S/P percutaneous coronary angioplasty No chest pain reported at this time Troponin is elevated, however improved compared to prior Continue Eliquis    History of CVA and bilateral carotid artery stenosis (cerebrovascular accident) History of CVA with carotid artery stenosis, as well as extensive PAD PCP recently stopped Plavix  Continue Eliquis          Consultants: Nephrology PT OT [facility] team   Procedures: Echocardiogram   Antibiotics: None   30 Day Unplanned Readmission Risk Score    Flowsheet Row ED to Hosp-Admission (Current) from 07/11/2023 in [facility]U  30 Day Unplanned Readmission Risk Score (%) 25.2 Filed at 07/15/2023 0801       This score is the patient's risk of an unplanned readmission within 30 days of being discharged (0 -100%). The score is based on dignosis, age, lab data, medications, orders, and past utilization.   Low:  0-14.9   Medium: 15-21.9   High: 22-29.9   Extreme: 30 and above           Subjective: Angry yesterday, grieving today.  Frustrated about how he has lived his life, his quality of remaining life.    Prior to admission, he had cough/wheezing and was seen by PCP.  He took Levaquin plus steroids for 4 days PTA without improvement.  He also noted 13 pound weight gain with persistent cough/SOB with ambulation.   Objective: Vitals:   07/15/23 1601 07/15/23 1625  BP: (!) 160/88 (!) 166/81  Pulse: 65 64  Resp: 19 18  Temp: (!) 97.1 F (36.2  C) 98.9 F (37.2 C)  SpO2: 99% 98%    Intake/Output Summary (Last 24 hours) at 07/15/2023 1717 Last data filed at 07/15/2023 1042 Gross per 24 hour  Intake 480 ml  Output 300 ml  Net 180 ml   Filed Weights   07/13/23 0500 07/14/23 0500 07/15/23 0500  Weight: 82.2 kg 82.6 kg 83.4 kg    Exam:  General:  Appears calm and comfortable and is in NAD, on [place] O2 Eyes:    EOMI, normal lids, iris ENT:  grossly normal hearing, lips & tongue, mmm Cardiovascular:  RRR, no m/r/g. No LE edema Respiratory:   CTA bilaterally with no wheezes/rales/rhonchi.  Normal respiratory effort. Abdomen:  soft, NT, ND Skin:  no rash or induration seen on limited exam Musculoskeletal:  grossly normal tone BUE/BLE, good ROM, no bony abnormality Psychiatric:  morose mood and affect, speech fluent and appropriate, AOx3 Neurologic:  CN 2-12 grossly intact, moves all extremities in coordinated fashion  Data Reviewed: I have reviewed the patient's lab results since admission.  Pertinent labs for today include:   Na++ 129 K+ 3.3 Glucose 119 BUN 139/Creatinine 4.5/GFR 13 WBC 11.5 Hgb 12.3     Family Communication: Wife was present throughout evaluation  Disposition: Status is: Inpatient Remains inpatient appropriate because: ongoing management     Time spent: 50 minutes  Unresulted Labs (From admission, onward)     Start     Ordered   07/16/23 0500  Basic metabolic panel with GFR  Daily,   R      07/15/23 1717             Author: Lorita Rosa, MD 07/15/2023 5:17 PM  For on call review [email or web address].

## 2023-07-15 NOTE — Progress Notes (Signed)
 Physical Therapy Treatment Patient Details Name: Gary Mccoy. MRN: [id number] DOB: [birth year] Today's Date: 07/15/2023   History of Present Illness Gary Mccoy. is a 72yoM, PMH: HFpEF, CAD, PAD, CAS, COPD, CKD-IV, HLD, CVA, who presents to the ED due to shortness of breath. Pt was placed on BIPAP and weaned to 3L [place] O2, likely associated with CHF + COPD.  Diuresed with Lasix , echo pending, started on prednisone .    PT Comments  Pt on room air on entry 94%, reports ad lib AMB to BR last 2 days on room air, significantly worse DOE than baseline. Standing bedside for balance assessment, 84% SpO2, ultimately requires 4L/min for sats >88%. Pt partakes in balance training at bedside, a few LOB in general, but pt very much aware of his own limitations. MinGuard assist provided in session, however pt recovers all but 2 of his LOB. Pt has been managing his own mobility for BR needs in room, but typically doffs O2 for this. Will continue to follow.    If plan is discharge home, recommend the following: A little help with walking and/or transfers;A little help with bathing/dressing/bathroom;Assistance with cooking/housework;Assist for transportation;Help with stairs or ramp for entrance;Direct supervision/assist for medications management   Can travel by private vehicle        Equipment Recommendations  None recommended by PT    Recommendations for Other Services       Precautions / Restrictions Precautions Precautions: Fall Restrictions Weight Bearing Restrictions Per Provider Order: No     Mobility  Bed Mobility Overal bed mobility: Modified Independent                  Transfers Overall transfer level: Modified independent Equipment used: None                    Ambulation/Gait Ambulation/Gait assistance:  (pt AMB ad lib in room durign session for balance activity; this is not a focus of session)                 Stairs             Wheelchair  Mobility     Tilt Bed    Modified Rankin (Stroke Patients Only)       Balance                                            Communication    Cognition Arousal: Alert Behavior During Therapy: Anxious, Restless, WFL for tasks assessed/performed                             Following commands: Intact      Cueing    Exercises Other Exercises Other Exercises: eye closed normal stance 30 sec; eye closed narrow stance 30 sec; Other Exercises: 6" anterior foot taps, requires RUE support on rail during Left foot taps (chronic right leg defcits from CVA) Other Exercises: foam stance pillow toss catch x15, then overhead rebounding off wall x15 (3 LOB) Other Exercises: 180 degree turns, o UE support 1x8 bilat    General Comments        Pertinent Vitals/Pain Pain Assessment Pain Assessment: No/denies pain    Home Living  Prior Function            PT Goals (current goals can now be found in the care plan section) Acute Rehab PT Goals Patient Stated Goal: to return home PT Goal Formulation: With patient Time For Goal Achievement: 07/26/23 Potential to Achieve Goals: Good Progress towards PT goals: Progressing toward goals    Frequency    Min 1X/week      PT Plan      Co-evaluation              AM-PAC PT "6 Clicks" Mobility   Outcome Measure  Help needed turning from your back to your side while in a flat bed without using bedrails?: None Help needed moving from lying on your back to sitting on the side of a flat bed without using bedrails?: None Help needed moving to and from a bed to a chair (including a wheelchair)?: None Help needed standing up from a chair using your arms (e.g., wheelchair or bedside chair)?: None Help needed to walk in hospital room?: A Little Help needed climbing 3-5 steps with a railing? : A Little 6 Click Score: 22    End of Session   Activity Tolerance: Treatment  limited secondary to medical complications (Comment);Other (comment) (DOE while up, requires supplemental at 4L for adequate sats durign activity) Patient left: with call bell/phone within reach;in bed   PT Visit Diagnosis: Unsteadiness on feet (R26.81);Muscle weakness (generalized) (M62.81)     Time: 1610-9604 PT Time Calculation (min) (ACUTE ONLY): 24 min  Charges:    $Neuromuscular Re-education: 23-37 mins PT General Charges $$ ACUTE PT VISIT: 1 Visit                    12:47 PM, 07/15/23 Dawn Eth, PT, DPT Physical Therapist - [facility]  [phone] (ASCOM)     Suleman Gunning C 07/15/2023, 12:45 PM

## 2023-07-15 NOTE — Progress Notes (Signed)
 [facility]  ROUNDING NOTE   Subjective:  Mr. Gregori Okelley. was admitted to [facility] on 06/21/2023 for shortness of breath with suspect of CHF vs COPD exacerbation. Patient reports increase weight gain of 13lbs and states his concern for repeat of previous CHF exacerbation.   Patient is followed by Dr Zelda Hickman outpatient.   Update:  Patient seen sitting up in bed Alert Continues to feel bad  Creatinine 4.50 BUN 139 UOP recorded overnight    Objective:  Vital signs in last 24 hours:  Temp:  [97.4 F (36.3 C)-97.8 F (36.6 C)] 97.5 F (36.4 C) (04/29 0846) Pulse Rate:  [55-72] 67 (04/29 1215) Resp:  [18-19] 19 (04/29 0846) BP: (158-184)/(72-87) 171/74 (04/29 1215) SpO2:  [94 %-100 %] 94 % (04/29 1215) Weight:  [83.4 kg] 83.4 kg (04/29 0500)  Weight change: 0.8 kg Filed Weights   07/13/23 0500 07/14/23 0500 07/15/23 0500  Weight: 82.2 kg 82.6 kg 83.4 kg    Intake/Output: I/O last 3 completed shifts: In: 240 [P.O.:240] Out: 1250 [Urine:1250]   Intake/Output this shift:  Total I/O In: 360 [P.O.:360] Out: -   Physical Exam: General: NAD,   Head: Normocephalic, atraumatic. Moist oral mucosal membranes  Eyes: Anicteric  Lungs:  2L 02, clear to auscultation  Heart: Regular rate and rhythm  Abdomen:  Soft, nontender,   Extremities:  No peripheral edema.  Neurologic: Alert, moving all four extremities  Skin: No lesions  Access: None    Basic Metabolic Panel: Recent Labs  Lab 07/11/23 1045 07/12/23 0422 07/13/23 0421 07/13/23 0424 07/14/23 0430 07/15/23 0433  NA 137 134*  --  131* 132* 129*  K 4.6 4.2  --  3.3* 3.2* 3.3*  CL 104 98  --  94* 93* 92*  CO2 23 25  --  23 24 25   GLUCOSE 109* 129*  --  135* 138* 119*  BUN 87* 97*  --  112* 135* 139*  CREATININE 3.63* 3.68*  --  3.75* 3.93* 4.50*  CALCIUM  9.0 9.0  --  8.6* 8.7* 8.6*  MG  --  2.4  --   --   --   --   PHOS  --   --  6.6*  --   --   --     Liver Function Tests: No results  for input(s): "AST", "ALT", "ALKPHOS", "BILITOT", "PROT", "ALBUMIN" in the last 168 hours. No results for input(s): "LIPASE", "AMYLASE" in the last 168 hours. No results for input(s): "AMMONIA" in the last 168 hours.  CBC: Recent Labs  Lab 07/11/23 1045 07/12/23 0422 07/13/23 0424 07/14/23 0430 07/15/23 0433  WBC 17.1* 12.5* 11.4* 8.1 11.5*  NEUTROABS  --  11.3* 10.0* 7.3 9.6*  HGB 14.5 12.9* 12.2* 12.9* 12.3*  HCT 42.8 37.6* 34.5* 36.5* 34.6*  MCV 88.1 85.8 84.4 83.0 82.8  PLT 209 192 167 182 175    Cardiac Enzymes: No results for input(s): "CKTOTAL", "CKMB", "CKMBINDEX", "TROPONINI" in the last 168 hours.  BNP: Invalid input(s): "POCBNP"  CBG: No results for input(s): "GLUCAP" in the last 168 hours.  Microbiology: Results for orders placed or performed during the hospital encounter of 07/11/23  Blood culture (routine x 2)     Status: None (Preliminary result)   Collection Time: 07/11/23 10:45 AM   Specimen: BLOOD RIGHT FOREARM  Result Value Ref Range Status   Specimen Description BLOOD RIGHT FOREARM  Final   Special Requests   Final    BOTTLES DRAWN AEROBIC AND ANAEROBIC  Blood Culture results may not be optimal due to an inadequate volume of blood received in culture bottles   Culture   Final    NO GROWTH 4 DAYS Performed at [facility], [address] [place], [place] [address]    Report Status PENDING  Incomplete  Blood culture (routine x 2)     Status: None (Preliminary result)   Collection Time: 07/11/23 10:45 AM   Specimen: BLOOD LEFT FOREARM  Result Value Ref Range Status   Specimen Description BLOOD LEFT FOREARM  Final   Special Requests   Final    BOTTLES DRAWN AEROBIC AND ANAEROBIC Blood Culture adequate volume   Culture   Final    NO GROWTH 4 DAYS Performed at [facility], [address] [place], [place] [address]    Report Status PENDING  Incomplete  Resp panel by RT-PCR (RSV, Flu A&B, Covid) Anterior Nasal Swab     Status:  None   Collection Time: 07/11/23  1:53 PM   Specimen: Anterior Nasal Swab  Result Value Ref Range Status   SARS Coronavirus 2 by RT PCR NEGATIVE NEGATIVE Final    Comment: (NOTE) SARS-CoV-2 target nucleic acids are NOT DETECTED.  The SARS-CoV-2 RNA is generally detectable in upper respiratory specimens during the acute phase of infection. The lowest concentration of SARS-CoV-2 viral copies this assay can detect is 138 copies/mL. A negative result does not preclude SARS-Cov-2 infection and should not be used as the sole basis for treatment or other patient management decisions. A negative result may occur with  improper specimen collection/handling, submission of specimen other than nasopharyngeal swab, presence of viral mutation(s) within the areas targeted by this assay, and inadequate number of viral copies(<138 copies/mL). A negative result must be combined with clinical observations, patient history, and epidemiological information. The expected result is Negative.  Fact Sheet for Patients:  BloggerCourse.com  Fact Sheet for Healthcare Providers:  SeriousBroker.it  This test is no t yet approved or cleared by the [place]  FDA and  has been authorized for detection and/or diagnosis of SARS-CoV-2 by FDA under an Emergency Use Authorization (EUA). This EUA will remain  in effect (meaning this test can be used) for the duration of the COVID-19 declaration under Section 564(b)(1) of the Act, 21 U.S.C.section 360bbb-3(b)(1), unless the authorization is terminated  or revoked sooner.       Influenza A by PCR NEGATIVE NEGATIVE Final   Influenza B by PCR NEGATIVE NEGATIVE Final    Comment: (NOTE) The Xpert Xpress SARS-CoV-2/FLU/RSV plus assay is intended as an aid in the diagnosis of influenza from Nasopharyngeal swab specimens and should not be used as a sole basis for treatment. Nasal washings and aspirates are unacceptable  for Xpert Xpress SARS-CoV-2/FLU/RSV testing.  Fact Sheet for Patients: BloggerCourse.com  Fact Sheet for Healthcare Providers: SeriousBroker.it  This test is not yet approved or cleared by the [place]  FDA and has been authorized for detection and/or diagnosis of SARS-CoV-2 by FDA under an Emergency Use Authorization (EUA). This EUA will remain in effect (meaning this test can be used) for the duration of the COVID-19 declaration under Section 564(b)(1) of the Act, 21 U.S.C. section 360bbb-3(b)(1), unless the authorization is terminated or revoked.     Resp Syncytial Virus by PCR NEGATIVE NEGATIVE Final    Comment: (NOTE) Fact Sheet for Patients: BloggerCourse.com  Fact Sheet for Healthcare Providers: SeriousBroker.it  This test is not yet approved or cleared by the [place]  FDA and has been  authorized for detection and/or diagnosis of SARS-CoV-2 by FDA under an Emergency Use Authorization (EUA). This EUA will remain in effect (meaning this test can be used) for the duration of the COVID-19 declaration under Section 564(b)(1) of the Act, 21 U.S.C. section 360bbb-3(b)(1), unless the authorization is terminated or revoked.  Performed at [facility], [address] [place], [place] [address]     Coagulation Studies: No results for input(s): "LABPROT", "INR" in the last 72 hours.   Urinalysis: No results for input(s): "COLORURINE", "LABSPEC", "PHURINE", "GLUCOSEU", "HGBUR", "BILIRUBINUR", "KETONESUR", "PROTEINUR", "UROBILINOGEN", "NITRITE", "LEUKOCYTESUR" in the last 72 hours.  Invalid input(s): "APPERANCEUR"    Imaging: No results found.    Medications:      allopurinol  100 mg Oral Daily   amLODipine   5 mg Oral BID   apixaban   2.5 mg Oral BID   carvedilol   12.5 mg Oral BID WC   cholecalciferol  1,000 Units Oral Daily   cloNIDine   0.1 mg  Oral BID   cyanocobalamin   1,000 mcg Oral Daily   hydrALAZINE   100 mg Oral TID   Ipratropium-Albuterol   1 puff Inhalation Q6H   isosorbide  dinitrate  40 mg Oral TID   metoprolol  succinate  25 mg Oral Daily   pantoprazole   40 mg Oral Daily   predniSONE   40 mg Oral Q breakfast   rosuvastatin   20 mg Oral Daily   sodium chloride  flush  3 mL Intravenous Q12H   acetaminophen , albuterol , ondansetron  (ZOFRAN ) IV, mouth rinse, sodium chloride  flush  Assessment/ Plan:  Mr. Ceaser Garay. is a 73 y.o.  male with PMH of HFrEF,CAD, PVD, CVA, COPD, stage IV CKD who is admitted to [facility] on 06/21/2022 for COPD exacerbation (HCC) [J44.1] COPD with acute exacerbation (HCC) [J44.1] Stage 4 chronic kidney disease (HCC) [N18.4] Cardiorenal syndrome [I13.10]  Acute Kidney Injury on chronic kidney disease stage IV 2/2 to acute cardiorenal syndrome: baseline creatinine of 3.54, GFR of 18. 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  Creatinine elevated again today. Continues to have some uremic symptoms. No acute indication for dialysis. Will continue to monitor. Patient is not appropriate for discharge due to rising creatinine.   Lab Results  Component Value Date   CREATININE 4.50 (H) 07/15/2023   CREATININE 3.93 (H) 07/14/2023   CREATININE 3.75 (H) 07/13/2023     Intake/Output Summary (Last 24 hours) at 07/15/2023 1311 Last data filed at 07/15/2023 1042 Gross per 24 hour  Intake 480 ml  Output 300 ml  Net 180 ml      Acute Respiratory distress 2/2 Acute COPD vs Acute exacerbation of chronic systolic and diastolic congestive heart failure.   -06/18/22 ECHO LV EF 50%-60%. Repeat ECHO pending results. Chest CT ordered - Weaned to room air .   3. Anemia with chronic kidney disease:  HgB 12.3. Acceptable   Hypertension with chronic kidney disease. Currently on 40 mg isosorbide  dinitrate, metoprolol  25 mg daily, hydralazine  100 mg TID, amlodipine  5 mg, carvedilol  12.5 mg BID  Also  prescribed Clonidine   Secondary Hyperparathyroidism  Ca 8.6, Phos, PTH 124 Calcium  stable   LOS: 3 Samariah Hokenson 4/29/20251:11 PM

## 2023-07-15 NOTE — Care Management Important Message (Signed)
 Important Message  Patient Details  Name: Gary Mccoy. MRN: [id number] Date of Birth: Oct 09, 1950   Important Message Given:  Yes - Medicare IM     Anise Kerns 07/15/2023, 2:19 PM

## 2023-07-15 NOTE — Plan of Care (Signed)
  Problem: Clinical Measurements: Goal: Ability to maintain clinical measurements within normal limits will improve Outcome: Progressing Goal: Will remain free from infection Outcome: Progressing Goal: Diagnostic test results will improve Outcome: Progressing Goal: Respiratory complications will improve Outcome: Progressing Goal: Cardiovascular complication will be avoided Outcome: Progressing   Problem: Activity: Goal: Risk for activity intolerance will decrease Outcome: Progressing   Problem: Elimination: Goal: Will not experience complications related to bowel motility Outcome: Progressing Goal: Will not experience complications related to urinary retention Outcome: Progressing   Problem: Pain Managment: Goal: General experience of comfort will improve and/or be controlled Outcome: Progressing

## 2023-07-16 ENCOUNTER — Encounter: Payer: Self-pay | Admitting: Vascular Surgery

## 2023-07-16 ENCOUNTER — Encounter
Admission: EM | Disposition: A | Discharge status: Home / Self Care | Payer: Self-pay | Source: Home / Self Care | Attending: Internal Medicine

## 2023-07-16 DIAGNOSIS — N179 Acute kidney failure, unspecified: Secondary | ICD-10-CM | POA: Diagnosis not present

## 2023-07-16 DIAGNOSIS — J9621 Acute and chronic respiratory failure with hypoxia: Secondary | ICD-10-CM | POA: Diagnosis not present

## 2023-07-16 HISTORY — PX: TEMPORARY DIALYSIS CATHETER: CATH118312

## 2023-07-16 LAB — CULTURE, BLOOD (ROUTINE X 2)
Culture: NO GROWTH
Culture: NO GROWTH
Special Requests: ADEQUATE

## 2023-07-16 LAB — BASIC METABOLIC PANEL WITH GFR
Anion gap: 16 — ABNORMAL HIGH (ref 5–15)
BUN: 164 mg/dL — ABNORMAL HIGH (ref 8–23)
CO2: 22 mmol/L (ref 22–32)
Calcium: 8.7 mg/dL — ABNORMAL LOW (ref 8.9–10.3)
Chloride: 92 mmol/L — ABNORMAL LOW (ref 98–111)
Creatinine, Ser: 4.61 mg/dL — ABNORMAL HIGH (ref 0.61–1.24)
GFR, Estimated: 13 mL/min — ABNORMAL LOW (ref >60)
Glucose, Bld: 128 mg/dL — ABNORMAL HIGH (ref 70–99)
Potassium: 3.2 mmol/L — ABNORMAL LOW (ref 3.5–5.1)
Sodium: 130 mmol/L — ABNORMAL LOW (ref 135–145)

## 2023-07-16 LAB — GLUCOSE, CAPILLARY: Glucose-Capillary: 112 mg/dL — ABNORMAL HIGH (ref 70–99)

## 2023-07-16 LAB — HEPATITIS B SURFACE ANTIGEN: Hepatitis B Surface Ag: NONREACTIVE

## 2023-07-16 SURGERY — TEMPORARY DIALYSIS CATHETER
Anesthesia: Moderate Sedation

## 2023-07-16 MED ORDER — BISACODYL 10 MG RE SUPP: 10.0000 mg | Freq: Every day | RECTAL | Status: DC | PRN

## 2023-07-16 MED ORDER — SENNOSIDES-DOCUSATE SODIUM 8.6-50 MG PO TABS
2.0000 | ORAL_TABLET | Freq: Every day | ORAL | Status: AC
  Administered 2023-07-16: 2 via ORAL
  Filled 2023-07-16: qty 2

## 2023-07-16 MED ORDER — POLYETHYLENE GLYCOL 3350 17 G PO PACK
17.0000 g | PACK | Freq: Two times a day (BID) | ORAL | Status: AC
  Administered 2023-07-16: 17 g via ORAL
  Filled 2023-07-16 (×2): qty 1

## 2023-07-16 MED ORDER — CHLORHEXIDINE GLUCONATE CLOTH 2 % EX PADS
6.0000 | MEDICATED_PAD | Freq: Every day | CUTANEOUS | Status: DC
  Administered 2023-07-17 – 2023-07-18 (×2): 6 via TOPICAL

## 2023-07-16 MED ORDER — ALTEPLASE 2 MG IJ SOLR: 2.0000 mg | Freq: Once | INTRAMUSCULAR | Status: DC | PRN

## 2023-07-16 MED ORDER — LIDOCAINE-EPINEPHRINE (PF) 1 %-1:200000 IJ SOLN
INTRAMUSCULAR | Status: DC | PRN
  Administered 2023-07-16: 20 mL

## 2023-07-16 MED ORDER — PREDNISONE 20 MG PO TABS
20.0000 mg | ORAL_TABLET | Freq: Every day | ORAL | Status: DC
  Administered 2023-07-17 – 2023-07-24 (×8): 20 mg via ORAL
  Filled 2023-07-16 (×6): qty 1
  Filled 2023-07-16: qty 2
  Filled 2023-07-16: qty 1

## 2023-07-16 MED ORDER — HEPARIN SODIUM (PORCINE) 1000 UNIT/ML DIALYSIS: 1000.0000 [IU] | INTRAMUSCULAR | Status: DC | PRN

## 2023-07-16 MED ORDER — CARVEDILOL 25 MG PO TABS
25.0000 mg | ORAL_TABLET | Freq: Two times a day (BID) | ORAL | Status: DC
  Administered 2023-07-16 – 2023-07-24 (×15): 25 mg via ORAL
  Filled 2023-07-16 (×15): qty 1

## 2023-07-16 SURGICAL SUPPLY — 3 items
BIOPATCH RED 1 DISK 7.0 (GAUZE/BANDAGES/DRESSINGS) IMPLANT
KIT DIALYSIS CATH TRI 30X13 (CATHETERS) IMPLANT
PACK ANGIOGRAPHY (CUSTOM PROCEDURE TRAY) ×1 IMPLANT

## 2023-07-16 NOTE — Progress Notes (Signed)
 PROGRESS NOTE    Gary Mccoy.   WUJ:[id number] DOB: [birth year]  DOA: 07/11/2023 Date of Service: 07/16/23 which is hospital day 4  PCP: Sari Cunning, MD    Hospital course / significant events:   HPI: 73yo with h/o HFrEF, CAD/PAD, carotid stenosis, stage IV CKD, COPD, HLD, and CVA who presented on 4/25 with SOB.    04/25: admitted to hospitalist w/ resp fail requiring BiPap, d/t COPD and HFrEF exacerbations.  04/26: on [place] O2, Nephrology consult for cardiorenal syndrome / advancing CKD. Started lasix  drip. Echo done.  04/27: continue lasix  drip 04/28: lasix  drip d/c  04/29: no immediate dialysis need but monitoring off lasix   04/30: nephro recs for dialysis, which was started today     Consultants:  Nephrology  Vascular surgery   Procedures/Surgeries: 07/16/23 R femoral temporary dialysis catheter placed      ASSESSMENT & PLAN:   Acute hypoxic respiratory failure Due to acute on chronic heart failure +/- bronchitis/COPD exacerbation  Required BiPAP transiently on presentation, weaned to [place] O2 O2 support as needed  Treat underlying cause(s)   Cardiorenal syndrome complicated by stage IV CKD question progression to ESRD  Patient's renal dysfunction appears to be stable, but he appears unable to effectively balance CHF associated with volume overload, not improved with Lasix  Nephrology following  Dialysis initiated today Monitor BMP   Acute on chronic combined CHF Echo with EF 45-50%, global hypokinesis, moderate to severe LV dilatation, grade 1 DD IV Lasix  --> Lasix  infusion stopped 4/28 --> off diuresis and monitoring renal fxn  Dialysis initiated today  Strict I&O    HLD Crestor    HTN Hold amlodipine  was taking carvedilol , clonidine , hydralazine , Toprol  XL --> duplicate beta blocker, d/c metoprolol  and will continue coreg      COPD w/ questionable exacerbation given shortness of breath and cough but SOB worsened despite Levaquin and steroids x 4  days PTA, more likely d/t CHF  Combivent prednisone  taper Albuterol  q2h prn No current wheezing noted   CAD S/P percutaneous coronary angioplasty No chest pain reported at this time Troponin is elevated, however improved compared to prior Continue Eliquis , no ASA Carvedilol   No ACE/ARB d/t renal function at this time  Statin  Isosorbide     History of CVA and bilateral carotid artery stenosis (cerebrovascular accident) History of CVA with carotid artery stenosis, as well as extensive PAD PCP recently stopped Plavix  Continue Eliquis   Hx gout Allopurinol renal dose  GERD PPI  No concerns based on BMI: Body mass index is 24.25 kg/m.[name] Significantly low or high BMI is associated with higher medical risk.  Underweight - under 18  overweight - 25 to 29 obese - 30 or more Class 1 obesity: BMI of 30.0 to 34 Class 2 obesity: BMI of 35.0 to 39 Class 3 obesity: BMI of 40.0 to 49 Super Morbid Obesity: BMI 50-59 Super-super Morbid Obesity: BMI 60+ Healthy nutrition and physical activity advised as adjunct to other disease management and risk reduction treatments    DVT prophylaxis: heparin  IV fluids: no continuous IV fluids  Nutrition: cardiac/carb Central lines / other devices: tempcath for HD  Code Status: FULL CODE ACP documentation reviewed: none on file in [facility]  TOC needs: TBD Medical barriers to dispo: dialysis starting. Expected medical readiness for discharge several more days at soonest.              Subjective / Brief ROS:  Patient reports some upset that he will need dialysis but he is okay  with it Denies CP/SOB.  Pain controlled.  Denies new weakness.    Family Communication: wife at bedside on rounds     Objective Findings:  Vitals:   07/16/23 1543 07/16/23 1600 07/16/23 1630 07/16/23 1700  BP: (!) 158/73 (!) 166/77 (!) 164/78 (!) 167/76  Pulse: 62 64 64 72  Resp: 12 13 13 17   Temp:      TempSrc:      SpO2: 96% 96% 96% 95%  Weight:       Height:        Intake/Output Summary (Last 24 hours) at 07/16/2023 1715 Last data filed at 07/16/2023 1417 Gross per 24 hour  Intake 360 ml  Output --  Net 360 ml   Filed Weights   07/14/23 0500 07/15/23 0500 07/16/23 0503  Weight: 82.6 kg 83.4 kg 83.4 kg    Examination:  Physical Exam Constitutional:      General: He is not in acute distress. Cardiovascular:     Rate and Rhythm: Normal rate and regular rhythm.  Pulmonary:     Effort: Pulmonary effort is normal. No respiratory distress.     Breath sounds: Rales present.  Skin:    General: Skin is warm and dry.  Neurological:     General: No focal deficit present.     Mental Status: He is alert and oriented to person, place, and time. Mental status is at baseline.  Psychiatric:        Behavior: Behavior normal.          Scheduled Medications:   allopurinol  100 mg Oral Daily   amLODipine   5 mg Oral BID   apixaban   2.5 mg Oral BID   carvedilol   25 mg Oral BID WC   [START ON 07/17/2023] Chlorhexidine  Gluconate Cloth  6 each Topical Q0600   cholecalciferol  1,000 Units Oral Daily   cloNIDine   0.1 mg Oral BID   cyanocobalamin   1,000 mcg Oral Daily   hydrALAZINE   100 mg Oral TID   Ipratropium-Albuterol   1 puff Inhalation Q6H   isosorbide  dinitrate  40 mg Oral TID   pantoprazole   40 mg Oral Daily   [START ON 07/17/2023] predniSONE   20 mg Oral Q breakfast   rosuvastatin   20 mg Oral Daily   sodium chloride  flush  3 mL Intravenous Q12H    Continuous Infusions:   PRN Medications:  acetaminophen , albuterol , alteplase, heparin , lidocaine -EPINEPHrine  (PF), ondansetron  (ZOFRAN ) IV, mouth rinse, sodium chloride  flush  Antimicrobials from admission:  Anti-infectives (From admission, onward)    None           Data Reviewed:  I have personally reviewed the following...  CBC: Recent Labs  Lab 07/11/23 1045 07/12/23 0422 07/13/23 0424 07/14/23 0430 07/15/23 0433  WBC 17.1* 12.5* 11.4* 8.1 11.5*  NEUTROABS   --  11.3* 10.0* 7.3 9.6*  HGB 14.5 12.9* 12.2* 12.9* 12.3*  HCT 42.8 37.6* 34.5* 36.5* 34.6*  MCV 88.1 85.8 84.4 83.0 82.8  PLT 209 192 167 182 175   Basic Metabolic Panel: Recent Labs  Lab 07/12/23 0422 07/13/23 0421 07/13/23 0424 07/14/23 0430 07/15/23 0433 07/16/23 0619  NA 134*  --  131* 132* 129* 130*  K 4.2  --  3.3* 3.2* 3.3* 3.2*  CL 98  --  94* 93* 92* 92*  CO2 25  --  23 24 25 22   GLUCOSE 129*  --  135* 138* 119* 128*  BUN 97*  --  112* 135* 139* 164*  CREATININE 3.68*  --  3.75* 3.93* 4.50* 4.61*  CALCIUM  9.0  --  8.6* 8.7* 8.6* 8.7*  MG 2.4  --   --   --   --   --   PHOS  --  6.6*  --   --   --   --    GFR: Estimated Creatinine Clearance: 16.6 mL/min (A) (by C-G formula based on SCr of 4.61 mg/dL (H)). Liver Function Tests: No results for input(s): "AST", "ALT", "ALKPHOS", "BILITOT", "PROT", "ALBUMIN" in the last 168 hours. No results for input(s): "LIPASE", "AMYLASE" in the last 168 hours. No results for input(s): "AMMONIA" in the last 168 hours. Coagulation Profile: Recent Labs  Lab 07/11/23 1045  INR 1.2   Cardiac Enzymes: No results for input(s): "CKTOTAL", "CKMB", "CKMBINDEX", "TROPONINI" in the last 168 hours. BNP (last 3 results) No results for input(s): "PROBNP" in the last 8760 hours. HbA1C: No results for input(s): "HGBA1C" in the last 72 hours. CBG: Recent Labs  Lab 07/16/23 0801  GLUCAP 112*   Lipid Profile: No results for input(s): "CHOL", "HDL", "LDLCALC", "TRIG", "CHOLHDL", "LDLDIRECT" in the last 72 hours. Thyroid  Function Tests: No results for input(s): "TSH", "T4TOTAL", "FREET4", "T3FREE", "THYROIDAB" in the last 72 hours. Anemia Panel: No results for input(s): "VITAMINB12", "FOLATE", "FERRITIN", "TIBC", "IRON", "RETICCTPCT" in the last 72 hours. Most Recent Urinalysis On File:     Component Value Date/Time   COLORURINE STRAW (A) 10/29/2021 0030   APPEARANCEUR CLEAR (A) 10/29/2021 0030   LABSPEC 1.005 10/29/2021 0030    PHURINE 5.0 10/29/2021 0030   GLUCOSEU NEGATIVE 10/29/2021 0030   HGBUR NEGATIVE 10/29/2021 0030   BILIRUBINUR NEGATIVE 10/29/2021 0030   KETONESUR NEGATIVE 10/29/2021 0030   PROTEINUR NEGATIVE 10/29/2021 0030   NITRITE NEGATIVE 10/29/2021 0030   LEUKOCYTESUR NEGATIVE 10/29/2021 0030   Sepsis Labs: @LABRCNTIP (procalcitonin:4,lacticidven:4) Microbiology: Recent Results (from the past 240 hours)  Blood culture (routine x 2)     Status: None   Collection Time: 07/11/23 10:45 AM   Specimen: BLOOD RIGHT FOREARM  Result Value Ref Range Status   Specimen Description BLOOD RIGHT FOREARM  Final   Special Requests   Final    BOTTLES DRAWN AEROBIC AND ANAEROBIC Blood Culture results may not be optimal due to an inadequate volume of blood received in culture bottles   Culture   Final    NO GROWTH 5 DAYS Performed at [facility], [address] [place], [place] [address]    Report Status 07/16/2023 FINAL  Final  Blood culture (routine x 2)     Status: None   Collection Time: 07/11/23 10:45 AM   Specimen: BLOOD LEFT FOREARM  Result Value Ref Range Status   Specimen Description BLOOD LEFT FOREARM  Final   Special Requests   Final    BOTTLES DRAWN AEROBIC AND ANAEROBIC Blood Culture adequate volume   Culture   Final    NO GROWTH 5 DAYS Performed at [facility], [address] [place], [place] [address]    Report Status 07/16/2023 FINAL  Final  Resp panel by RT-PCR (RSV, Flu A&B, Covid) Anterior Nasal Swab     Status: None   Collection Time: 07/11/23  1:53 PM   Specimen: Anterior Nasal Swab  Result Value Ref Range Status   SARS Coronavirus 2 by RT PCR NEGATIVE NEGATIVE Final    Comment: (NOTE) SARS-CoV-2 target nucleic acids are NOT DETECTED.  The SARS-CoV-2 RNA is generally detectable in upper respiratory specimens during the acute phase of infection. The lowest concentration of  SARS-CoV-2 viral copies this assay can detect is 138 copies/mL. A negative result  does not preclude SARS-Cov-2 infection and should not be used as the sole basis for treatment or other patient management decisions. A negative result may occur with  improper specimen collection/handling, submission of specimen other than nasopharyngeal swab, presence of viral mutation(s) within the areas targeted by this assay, and inadequate number of viral copies(<138 copies/mL). A negative result must be combined with clinical observations, patient history, and epidemiological information. The expected result is Negative.  Fact Sheet for Patients:  BloggerCourse.com  Fact Sheet for Healthcare Providers:  SeriousBroker.it  This test is no t yet approved or cleared by the [place]  FDA and  has been authorized for detection and/or diagnosis of SARS-CoV-2 by FDA under an Emergency Use Authorization (EUA). This EUA will remain  in effect (meaning this test can be used) for the duration of the COVID-19 declaration under Section 564(b)(1) of the Act, 21 U.S.C.section 360bbb-3(b)(1), unless the authorization is terminated  or revoked sooner.       Influenza A by PCR NEGATIVE NEGATIVE Final   Influenza B by PCR NEGATIVE NEGATIVE Final    Comment: (NOTE) The Xpert Xpress SARS-CoV-2/FLU/RSV plus assay is intended as an aid in the diagnosis of influenza from Nasopharyngeal swab specimens and should not be used as a sole basis for treatment. Nasal washings and aspirates are unacceptable for Xpert Xpress SARS-CoV-2/FLU/RSV testing.  Fact Sheet for Patients: BloggerCourse.com  Fact Sheet for Healthcare Providers: SeriousBroker.it  This test is not yet approved or cleared by the [place]  FDA and has been authorized for detection and/or diagnosis of SARS-CoV-2 by FDA under an Emergency Use Authorization (EUA). This EUA will remain in effect (meaning this test can be used) for  the duration of the COVID-19 declaration under Section 564(b)(1) of the Act, 21 U.S.C. section 360bbb-3(b)(1), unless the authorization is terminated or revoked.     Resp Syncytial Virus by PCR NEGATIVE NEGATIVE Final    Comment: (NOTE) Fact Sheet for Patients: BloggerCourse.com  Fact Sheet for Healthcare Providers: SeriousBroker.it  This test is not yet approved or cleared by the [place]  FDA and has been authorized for detection and/or diagnosis of SARS-CoV-2 by FDA under an Emergency Use Authorization (EUA). This EUA will remain in effect (meaning this test can be used) for the duration of the COVID-19 declaration under Section 564(b)(1) of the Act, 21 U.S.C. section 360bbb-3(b)(1), unless the authorization is terminated or revoked.  Performed at [facility], [address] [place], [place] [address]       Radiology Studies last 3 days: PERIPHERAL VASCULAR CATHETERIZATION Result Date: 07/16/2023 See surgical note for result.  CT CHEST WO CONTRAST Result Date: 07/13/2023 CLINICAL DATA:  shortness of breath, unclear etiology EXAM: CT CHEST WITHOUT CONTRAST TECHNIQUE: Multidetector CT imaging of the chest was performed following the standard protocol without IV contrast. RADIATION DOSE REDUCTION: This exam was performed according to the departmental dose-optimization program which includes automated exposure control, adjustment of the mA and/or kV according to patient size and/or use of iterative reconstruction technique. COMPARISON:  07/11/2023 FINDINGS: Cardiovascular: Unenhanced imaging of the heart is unremarkable without pericardial effusion. Normal caliber of the thoracic aorta. Atherosclerosis of the aorta and coronary vasculature. Assessment of the vascular lumen cannot be performed without IV contrast. Mediastinum/Nodes: No enlarged mediastinal or axillary lymph nodes. Thyroid  gland, trachea, and esophagus  demonstrate no significant findings. Lungs/Pleura: Trace bilateral pleural effusions, right greater than left. Mild biapical paraseptal emphysema.  Bilateral bronchial wall thickening, greatest at the lung bases, compatible with bronchitis or reactive airway disease. No airspace disease or pneumothorax. Upper Abdomen: Small calcified gallstones without evidence of acute cholecystitis. Right renal cortical atrophy. Musculoskeletal: No acute or destructive bony abnormalities. Reconstructed images demonstrate no additional findings. IMPRESSION: 1. Bilateral bronchial wall thickening, greatest at the bases, consistent with bronchitis or reactive airway disease. 2. Trace bilateral pleural effusions. 3. Mild biapical emphysema. 4. Aortic Atherosclerosis (ICD10-I70.0). Coronary artery atherosclerosis. 5. Incidental cholelithiasis without evidence of cholecystitis. Electronically Signed   By: Bobbye Burrow M.D.   On: 07/13/2023 15:22   ECHOCARDIOGRAM COMPLETE Result Date: 07/13/2023    ECHOCARDIOGRAM REPORT   Patient Name:   Gary Mccoy. Date of Exam: 07/12/2023 Medical Rec #:  [id number]             Height:       73.0 in Accession #:    [id number]            Weight:       181.7 lb Date of Birth:  June 29, 1950             BSA:          2.065 m Patient Age:    72 years              BP:           171/82 mmHg Patient Gender: M                     HR:           72 bpm. Exam Location:  Inpatient Procedure: 2D Echo (Both Spectral and Color Flow Doppler were utilized during            procedure). Indications:     acute diastolic chf  History:         Patient has prior history of Echocardiogram examinations, most                  recent 06/18/2022. CAD, PAD, COPD and chronic kidney disease,                  Signs/Symptoms:Shortness of Breath; Risk Factors:Current                  Smoker.  Sonographer:     Dione Franks RDCS Referring Phys:  [id number] Avi Body Diagnosing Phys: Antonette Batters MD  Sonographer  Comments: Image acquisition challenging due to COPD. IMPRESSIONS  1. Left ventricular ejection fraction, by estimation, is 45 to 50%. The left ventricle has mildly decreased function. The left ventricle demonstrates global hypokinesis. The left ventricular internal cavity size was moderately to severely dilated. Left ventricular diastolic parameters are consistent with Grade I diastolic dysfunction (impaired relaxation).  2. Right ventricular systolic function is normal. The right ventricular size is normal. Mildly increased right ventricular wall thickness.  3. The mitral valve is normal in structure. No evidence of mitral valve regurgitation.  4. The aortic valve is normal in structure. Aortic valve regurgitation is trivial. FINDINGS  Left Ventricle: Left ventricular ejection fraction, by estimation, is 45 to 50%. The left ventricle has mildly decreased function. The left ventricle demonstrates global hypokinesis. Strain was performed and the global longitudinal strain is indeterminate. Global longitudinal strain performed but not reported based on interpreter judgement due to suboptimal tracking. The left ventricular internal cavity size was moderately to severely dilated. There is borderline concentric left ventricular hypertrophy. Left ventricular  diastolic parameters are consistent with Grade I diastolic dysfunction (impaired relaxation). Right Ventricle: The right ventricular size is normal. Mildly increased right ventricular wall thickness. Right ventricular systolic function is normal. Left Atrium: Left atrial size was normal in size. Right Atrium: Right atrial size was normal in size. Pericardium: There is no evidence of pericardial effusion. Mitral Valve: The mitral valve is normal in structure. No evidence of mitral valve regurgitation. Tricuspid Valve: The tricuspid valve is normal in structure. Tricuspid valve regurgitation is trivial. Aortic Valve: The aortic valve is normal in structure. Aortic valve  regurgitation is trivial. Pulmonic Valve: The pulmonic valve was normal in structure. Pulmonic valve regurgitation is not visualized. Aorta: The ascending aorta was not well visualized. IAS/Shunts: No atrial level shunt detected by color flow Doppler. Additional Comments: 3D was performed not requiring image post processing on an independent workstation and was indeterminate.  LEFT VENTRICLE PLAX 2D LVIDd:         5.80 cm      Diastology LVIDs:         4.40 cm      LV e' medial:    4.24 cm/s LV PW:         1.00 cm      LV E/e' medial:  15.7 LV IVS:        1.10 cm      LV e' lateral:   6.09 cm/s LVOT diam:     2.30 cm      LV E/e' lateral: 10.9 LV SV:         86 LV SV Index:   42 LVOT Area:     4.15 cm  LV Volumes (MOD) LV vol d, MOD A4C: 113.0 ml LV vol s, MOD A4C: 61.6 ml LV SV MOD A4C:     113.0 ml RIGHT VENTRICLE             IVC RV Basal diam:  3.20 cm     IVC diam: 2.00 cm RV S prime:     13.20 cm/s TAPSE (M-mode): 2.6 cm LEFT ATRIUM             Index        RIGHT ATRIUM           Index LA diam:        4.10 cm 1.99 cm/m   RA Area:     13.70 cm LA Vol (A2C):   76.5 ml 37.04 ml/m  RA Volume:   35.60 ml  17.24 ml/m LA Vol (A4C):   51.0 ml 24.69 ml/m LA Biplane Vol: 65.4 ml 31.66 ml/m  AORTIC VALVE LVOT Vmax:   97.50 cm/s LVOT Vmean:  65.000 cm/s LVOT VTI:    0.207 m  AORTA Ao Root diam: 3.40 cm Ao Asc diam:  2.90 cm MITRAL VALVE MV Area (PHT): 3.72 cm     SHUNTS MV Decel Time: 204 msec     Systemic VTI:  0.21 m MV E velocity: 66.60 cm/s   Systemic Diam: 2.30 cm MV A velocity: 105.00 cm/s MV E/A ratio:  0.63 Antonette Batters MD Electronically signed by Antonette Batters MD Signature Date/Time: 07/13/2023/12:50:00 PM    Final          Melodi Sprung, DO [facility] 07/16/2023, 5:15 PM    Dictation software may have been used to generate the above note. Typos may occur and escape review in typed/dictated notes. Please contact Dr Authur Leghorn directly for clarity if needed.  Staff may  message me via secure chat in Epic  but this may not receive an immediate response,  please page me for urgent matters!  If 7PM-7AM, please contact night coverage [email or web address]

## 2023-07-16 NOTE — Op Note (Signed)
  OPERATIVE NOTE   PROCEDURE: Ultrasound guidance for vascular access right femoral vein Placement of a 30 cm triple lumen dialysis catheter right femoral vein  PRE-OPERATIVE DIAGNOSIS: 1. Acute renal failure  POST-OPERATIVE DIAGNOSIS: Same  SURGEON: Mikki Alexander, MD  ASSISTANT(S): None  ANESTHESIA: local  ESTIMATED BLOOD LOSS: Minimal   FINDING(S): 1.  None  SPECIMEN(S):  None  INDICATIONS:    Patient is a 73 y.o.male who presents with renal failure and needs a catheter for immediate dialysis.  Risks and benefits were discussed, and informed consent was obtained..  DESCRIPTION: After obtaining full informed written consent, the patient was laid flat in the bed.  The right groin was sterilely prepped and draped in a sterile surgical field was created. The right femoral vein was visualized with ultrasound and found to be widely patent. It was then accessed under direct guidance without difficulty with a Seldinger needle and a permanent image was recorded. A J-wire was then placed. After skin nick and dilatation, a 30 cm triple lumen dialysis catheter was placed over the wire and the wire was removed. The lumens withdrew dark red nonpulsatile blood and flushed easily with sterile saline. The catheter was secured to the skin with 3 nylon sutures. Sterile dressing was placed.  COMPLICATIONS: None  CONDITION: Stable  Mikki Alexander 07/16/2023 1:27 PM  This note was created with Dragon Medical transcription system. Any errors in dictation are purely unintentional.

## 2023-07-16 NOTE — Progress Notes (Signed)
 Occupational Therapy Treatment Patient Details Name: Gary Mccoy. MRN: [id number] DOB: May 29, 1950 Today's Date: 07/16/2023   History of present illness Gary Mccoy. is a 72yoM, PMH: HFpEF, CAD, PAD, CAS, COPD, CKD-IV, HLD, CVA, who presents to the ED due to shortness of breath. Pt was placed on BIPAP and weaned to 3L [place] O2, likely associated with CHF + COPD.   OT comments  Gary Mccoy was seen for OT treatment on this date. Upon arrival to room pt in bed, agreeable to tx. Pt requires SUPERVISION don underwear. SUPERVISION for ADL t/f ~200 ft including O2 tank mgmt. SpO2 95% on RA at rest, 86% on 3L with activity, 89% on 4L [place] with activity. MOD I for toilet t/f using O2. Pt making good progress toward goals, will continue to follow POC. Discharge recommendation updated to reflect progress.       If plan is discharge home, recommend the following:  Assistance with cooking/housework   Equipment Recommendations  None recommended by OT    Recommendations for Other Services      Precautions / Restrictions Precautions Precautions: Fall Recall of Precautions/Restrictions: Intact Restrictions Weight Bearing Restrictions Per Provider Order: No       Mobility Bed Mobility Overal bed mobility: Modified Independent                  Transfers Overall transfer level: Modified independent                       Balance Overall balance assessment: Needs assistance Sitting-balance support: Feet supported, No upper extremity supported Sitting balance-Leahy Scale: Normal     Standing balance support: No upper extremity supported, During functional activity Standing balance-Leahy Scale: Good                             ADL either performed or assessed with clinical judgement   ADL Overall ADL's : Needs assistance/impaired                                       General ADL Comments: SUPERVISION don underwear. SUPERVISION for  ADL t/f ~200 ft including O2 tank mgmt.    Extremity/Trunk Assessment Upper Extremity Assessment Upper Extremity Assessment: Overall WFL for tasks assessed   Lower Extremity Assessment Lower Extremity Assessment: Overall WFL for tasks assessed                 Communication Communication Communication: No apparent difficulties   Cognition Arousal: Alert Behavior During Therapy: Agitated, Flat affect Cognition: No apparent impairments             OT - Cognition Comments: Pt states "if I'm going to die et me die, otherwise leave me alone" and "I dont mean to be rude I just dont care"                 Following commands: Intact Following commands impaired: Follows multi-step commands with increased time      Cueing   Cueing Techniques: Verbal cues             Pertinent Vitals/ Pain       Pain Assessment Pain Assessment: No/denies pain   Frequency  Min 1X/week        Progress Toward Goals  OT Goals(current goals can now be found in the care plan  section)  Progress towards OT goals: Progressing toward goals  Acute Rehab OT Goals OT Goal Formulation: With patient Time For Goal Achievement: 07/26/23 Potential to Achieve Goals: Good ADL Goals Pt Will Perform Grooming: with modified independence;standing Pt Will Perform Lower Body Dressing: with modified independence;sit to/from stand Pt Will Transfer to Toilet: ambulating;regular height toilet;Independently Pt Will Perform Toileting - Clothing Manipulation and hygiene: Independently  Plan      Co-evaluation                 AM-PAC OT "6 Clicks" Daily Activity     Outcome Measure   Help from another person eating meals?: None Help from another person taking care of personal grooming?: None Help from another person toileting, which includes using toliet, bedpan, or urinal?: A Little Help from another person bathing (including washing, rinsing, drying)?: A Little Help from another person to  put on and taking off regular upper body clothing?: None Help from another person to put on and taking off regular lower body clothing?: None 6 Click Score: 22    End of Session Equipment Utilized During Treatment: Oxygen  OT Visit Diagnosis: Unsteadiness on feet (R26.81);Other abnormalities of gait and mobility (R26.89);Repeated falls (R29.6);Muscle weakness (generalized) (M62.81)   Activity Tolerance Patient tolerated treatment well   Patient Left in bed;with call bell/phone within reach   Nurse Communication          Time: [id number] OT Time Calculation (min): 9 min  Charges: OT General Charges $OT Visit: 1 Visit OT Treatments $Therapeutic Activity: 8-22 mins  Gordan Latina, M.S. OTR/L  07/16/23, 10:12 AM  ascom [phone]

## 2023-07-16 NOTE — Consult Note (Signed)
 Hospital Consult    Reason for Consult:  Dialysis Access needed  Requesting Physician:  Anola King NP  MRN #:  [id number]  History of Present Illness: This is a 72 y.o. male with h/o HFrEF, CAD/PAD, carotid stenosis, stage IV CKD, COPD, HLD, and CVA who presented on 4/25 with SOB. He was placed on BIPAP and weaned to 3L [place] O2, likely associated with CHF + COPD. Diuresed with Lasix , echo pending, started on prednisone .  Patient's BUN is 164 with creatinine of 4.61 and GFR of 13.  Vascular surgery was consulted for placement of a temporary dialysis catheter.  Past Medical History:  Diagnosis Date   Arthritis    lower back   Back pain    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    CHF (congestive heart failure) (HCC)    Chronic kidney disease    Complication of anesthesia    Violent with hallucinations after procedure 10/2015   Coronary artery disease    DDD (degenerative disc disease), lumbar    Duodenal ulcer    Elevated lipids    GERD (gastroesophageal reflux disease)    Leg weakness, bilateral    s/p lumbar surgery   PAD (peripheral artery disease) (HCC)    Polyradiculitis    Stroke (HCC)    Rt lower leg  estimated 10 - 12 stokes in last 5 years   Wears dentures    full upper and lower    Past Surgical History:  Procedure Laterality Date   AORTA - BILATERAL FEMORAL ARTERY BYPASS GRAFT N/A 11/08/2015   Procedure: AORTA BIFEMORAL BYPASS GRAFT;  Surgeon: Jackquelyn Mass, MD;  Location: [facility] OR[facility];  Service: Vascular;  Laterality: N/A;   BACK SURGERY  2005   CATARACT EXTRACTION W/PHACO Right 02/20/2022   Procedure: CATARACT EXTRACTION PHACO AND INTRAOCULAR LENS PLACEMENT (IOC) RIGHT;  Surgeon: Annell Kidney, MD;  Location: [facility];  Service: Ophthalmology;  Laterality: Right;  5.77 0:52.3   CATARACT EXTRACTION W/PHACO Left 03/06/2022   Procedure: CATARACT EXTRACTION PHACO AND INTRAOCULAR LENS PLACEMENT (IOC) LEFT;  Surgeon: Annell Kidney, MD;  Location: [facility];  Service: Ophthalmology;  Laterality: Left;  5.77 1:07.5   CERVICAL DISC SURGERY  1987   COLONOSCOPY WITH PROPOFOL  N/A 12/30/2017   Procedure: COLONOSCOPY WITH PROPOFOL ;  Surgeon: Toledo, Alphonsus Jeans, MD;  Location: [facility] END[facility];  Service: Gastroenterology;  Laterality: N/A;   COLONOSCOPY WITH PROPOFOL  N/A 02/02/2021   Procedure: COLONOSCOPY WITH PROPOFOL ;  Surgeon: Shane Darling, MD;  Location: [facility] END[facility];  Service: Endoscopy;  Laterality: N/A;  Melena   ENDARTERECTOMY FEMORAL Bilateral 11/08/2015   Procedure: ENDARTERECTOMY FEMORAL;  Surgeon: Jackquelyn Mass, MD;  Location: [facility];  Service: Vascular;  Laterality: Bilateral;  common, SFA, and profundis  Aortic endarterectomy    ESOPHAGOGASTRODUODENOSCOPY (EGD) WITH PROPOFOL  N/A 09/04/2015   Procedure: ESOPHAGOGASTRODUODENOSCOPY (EGD) WITH PROPOFOL ;  Surgeon: Marnee Sink, MD;  Location: [facility];  Service: Endoscopy;  Laterality: N/A;   ESOPHAGOGASTRODUODENOSCOPY (EGD) WITH PROPOFOL  N/A 02/02/2021   Procedure: ESOPHAGOGASTRODUODENOSCOPY (EGD) WITH PROPOFOL ;  Surgeon: Shane Darling, MD;  Location: [facility] END[facility];  Service: Endoscopy;  Laterality: N/A;  Melena   LUMBAR LAMINECTOMY  2006   RIGHT/LEFT HEART CATH AND CORONARY ANGIOGRAPHY Bilateral 03/28/2021   Procedure: RIGHT/LEFT HEART CATH AND CORONARY ANGIOGRAPHY;  Surgeon: Antonette Batters, MD;  Location: [facility];  Service: Cardiovascular;  Laterality: Bilateral;   SEPTOPLASTY N/A 09/28/2020   Procedure: SEPTOPLASTY;  Surgeon: Mellody Sprout, MD;  Location: [facility];  Service: ENT;  Laterality: N/A;   TEE WITHOUT CARDIOVERSION N/A 09/22/2015   Procedure: TRANSESOPHAGEAL ECHOCARDIOGRAM (TEE);  Surgeon: Ronney Cola, MD;  Location: [facility];  Service: Cardiovascular;  Laterality: N/A;   TURBINATE REDUCTION Bilateral 09/28/2020   Procedure: TURBINATE REDUCTION;  Surgeon: Mellody Sprout, MD;  Location: [facility];  Service:  ENT;  Laterality: Bilateral;    No Known Allergies  Prior to Admission medications   Medication Sig Start Date End Date Taking? Authorizing Provider  acetaminophen  (TYLENOL ) 500 MG tablet Take 500 mg by mouth every 6 (six) hours as needed.   Yes [provider]  albuterol  (VENTOLIN  HFA) 108 (90 Base) MCG/ACT inhaler Inhale 2 puffs into the lungs every 6 (six) hours as needed for wheezing or shortness of breath. 06/20/22  Yes Patel, Sona, MD  allopurinol (ZYLOPRIM) 100 MG tablet Take 100 mg by mouth daily. 09/09/22 09/09/23 Yes [provider]  amLODipine  (NORVASC ) 5 MG tablet Take 5 mg by mouth 2 (two) times daily.   Yes [provider]  apixaban  (ELIQUIS ) 2.5 MG TABS tablet Take 2.5 mg by mouth 2 (two) times daily. 03/29/22  Yes [provider]  carvedilol  (COREG ) 12.5 MG tablet Take 12.5 mg by mouth 2 (two) times daily with a meal. 12/12/22 07/06/24 Yes [provider]  Cholecalciferol (VITAMIN D-1000 MAX ST) 25 MCG (1000 UT) tablet Take 1,000 Units by mouth daily.   Yes [provider]  cloNIDine  (CATAPRES ) 0.1 MG tablet Take 0.1 mg by mouth 2 (two) times daily. 06/27/22  Yes [provider]  Coenzyme Q10 (COQ10) 200 MG CAPS Take 200 mg by mouth daily.   Yes [provider]  hydrALAZINE  (APRESOLINE ) 100 MG tablet Take 100 mg by mouth 3 (three) times daily.   Yes [provider]  Iron-Vitamin C (VITRON-C) 65-125 MG TABS Take 1 tablet by mouth daily.   Yes [provider]  isosorbide  dinitrate (ISORDIL ) 40 MG tablet Take 1 tablet (40 mg total) by mouth 3 (three) times daily. 06/20/22  Yes Patel, Sona, MD  isosorbide  mononitrate (IMDUR ) 60 MG 24 hr tablet Take 1 tablet by mouth daily. 03/13/23  Yes [provider]  magnesium  oxide (MAG-OX) 400 MG tablet Take 400 mg by mouth daily.   Yes [provider]  metolazone (ZAROXOLYN) 5 MG tablet Take 5 mg by mouth 2 (two) times a week. 04/25/22  Yes [provider]  metoprolol  succinate (TOPROL -XL) 25 MG 24 hr tablet Take 1 tablet (25 mg total) by mouth daily. 02/07/21  Yes Darus Engels A, DO  Multiple Vitamins-Minerals (MULTIVITAMIN ADULT PO) Take 1 tablet by mouth daily. Men   Yes [provider]  nitroGLYCERIN  (NITROSTAT ) 0.4 MG SL tablet Place 1 tablet (0.4 mg total) under the tongue every 5 (five) minutes x 3 doses as needed for chest pain. 06/20/22  Yes Patel, Sona, MD  Omega-3 Fatty Acids (FISH OIL PO) Take by mouth.   Yes [provider]  pantoprazole  (PROTONIX ) 40 MG tablet Take 40 mg by mouth daily. 03/26/22  Yes [provider]  potassium chloride  SA (KLOR-CON  M) 20 MEQ tablet Take 20 mEq by mouth 2 (two) times daily.   Yes [provider]  rosuvastatin  (CRESTOR ) 40 MG tablet Take 20 mg by mouth daily.   Yes [provider]  spironolactone (ALDACTONE) 25 MG tablet Take 25 mg by mouth daily. 03/06/23  Yes [provider]  vitamin B-12 (CYANOCOBALAMIN ) 1000 MCG tablet Take 1,000 mcg by mouth  daily.   Yes [provider]  clopidogrel  (PLAVIX ) 75 MG tablet Take 75 mg by mouth daily. Patient not taking: Reported on 07/11/2023 04/16/21   [provider]  furosemide  (LASIX ) 40 MG tablet Take 1 tablet (40 mg total) by mouth daily. for weight gain or swelling Patient taking differently: Take 40 mg by mouth daily. for weight gain or swelling. Takes 2-3 daily 10/30/21   Luna Salinas, MD    Social History   Socioeconomic History   Marital status: Married    Spouse name: Not on file   Number of children: Not on file   Years of education: Not on file   Highest education level: Not on file  Occupational History   Not on file  Tobacco Use   Smoking status: Every Day    Current packs/day: 1.00    Average packs/day: 1 pack/day for 45.0 years (45.0 ttl pk-yrs)    Types: Cigarettes   Smokeless tobacco: Never   Tobacco comments:    05/17/21- currently 0.5 PPD  Vaping Use    Vaping status: Never Used  Substance and Sexual Activity   Alcohol use: Yes    Comment: rare 3 a year   Drug use: No   Sexual activity: Not on file  Other Topics Concern   Not on file  Social History Narrative   Not on file   Social Drivers of Health   Financial Resource Strain: Low Risk  (02/20/2023)   Received from [facility]   Overall Financial Resource Strain (CARDIA)    Difficulty of Paying Living Expenses: Not hard at all  Food Insecurity: No Food Insecurity (07/11/2023)   Hunger Vital Sign    Worried About Running Out of Food in the Last Year: Never true    Ran Out of Food in the Last Year: Never true  Transportation Needs: No Transportation Needs (07/11/2023)   PRAPARE - Administrator, Civil Service (Medical): No    Lack of Transportation (Non-Medical): No  Physical Activity: Not on file  Stress: Not on file  Social Connections: Moderately Integrated (07/11/2023)   Social Connection and Isolation Panel [NHANES]    Frequency of Communication with Friends and Family: More than three times a week    Frequency of Social Gatherings with Friends and Family: More than three times a week    Attends Religious Services: More than 4 times per year    Active Member of [employer] or Organizations: No    Attends Banker Meetings: Never    Marital Status: Married  Catering manager Violence: Not At Risk (07/11/2023)   Humiliation, Afraid, Rape, and Kick questionnaire    Fear of Current or Ex-Partner: No    Emotionally Abused: No    Physically Abused: No    Sexually Abused: No     Family History  Problem Relation Age of Onset   Heart attack Mother    Hypertension Mother    Varicose Veins Mother    Diabetes Father    Heart attack Father    Heart attack Maternal Grandmother    Stroke Maternal Grandmother     ROS: Otherwise negative unless mentioned in HPI  Physical Examination  Vitals:   07/16/23 0414 07/16/23 0733  BP: (!) 169/76  (!) 166/75  Pulse: 65 65  Resp: 20 19  Temp: (!) 97.4 F (36.3 C) 98.6 F (37 C)  SpO2: 96% 95%   Body mass index is 24.25 kg/m.  General:  WDWN in  NAD Gait: Not observed HENT: WNL, normocephalic Pulmonary: normal non-labored breathing, without Rales, rhonchi,  wheezing Cardiac: regular, without  Murmurs, rubs or gallops; without carotid bruits Abdomen: Positive bowel sounds throughout, soft, NT/ND, no masses Skin: without rashes Vascular Exam/Pulses: Palpable pulses throughout Extremities: without ischemic changes, without Gangrene , without cellulitis; without open wounds;  Musculoskeletal: no muscle wasting or atrophy  Neurologic: A&O X 3;  No focal weakness or paresthesias are detected; speech is fluent/normal Psychiatric:  The pt has Normal affect. Lymph:  Unremarkable  CBC    Component Value Date/Time   WBC 11.5 (H) 07/15/2023 0433   RBC 4.18 (L) 07/15/2023 0433   HGB 12.3 (L) 07/15/2023 0433   HCT 34.6 (L) 07/15/2023 0433   PLT 175 07/15/2023 0433   MCV 82.8 07/15/2023 0433   MCH 29.4 07/15/2023 0433   MCHC 35.5 07/15/2023 0433   RDW 14.7 07/15/2023 0433   LYMPHSABS 0.6 (L) 07/15/2023 0433   MONOABS 1.1 (H) 07/15/2023 0433   EOSABS 0.0 07/15/2023 0433   BASOSABS 0.0 07/15/2023 0433    BMET    Component Value Date/Time   NA 130 (L) 07/16/2023 0619   K 3.2 (L) 07/16/2023 0619   CL 92 (L) 07/16/2023 0619   CO2 22 07/16/2023 0619   GLUCOSE 128 (H) 07/16/2023 0619   BUN 164 (H) 07/16/2023 0619   CREATININE 4.61 (H) 07/16/2023 0619   CALCIUM  8.7 (L) 07/16/2023 0619   GFRNONAA 13 (L) 07/16/2023 0619   GFRAA 48 (L) 02/26/2018 1359    COAGS: Lab Results  Component Value Date   INR 1.2 07/11/2023   INR 1.9 (H) 01/29/2021   INR 2.24 02/26/2018     Non-Invasive Vascular Imaging:   EXAM:07/13/23 CT CHEST WITHOUT CONTRAST   TECHNIQUE: Multidetector CT imaging of the chest was performed following the standard protocol without IV contrast.   RADIATION  DOSE REDUCTION: This exam was performed according to the departmental dose-optimization program which includes automated exposure control, adjustment of the mA and/or kV according to patient size and/or use of iterative reconstruction technique.   COMPARISON:  07/11/2023   FINDINGS: Cardiovascular: Unenhanced imaging of the heart is unremarkable without pericardial effusion. Normal caliber of the thoracic aorta. Atherosclerosis of the aorta and coronary vasculature. Assessment of the vascular lumen cannot be performed without IV contrast.   Mediastinum/Nodes: No enlarged mediastinal or axillary lymph nodes. Thyroid  gland, trachea, and esophagus demonstrate no significant findings.   Lungs/Pleura: Trace bilateral pleural effusions, right greater than left. Mild biapical paraseptal emphysema. Bilateral bronchial wall thickening, greatest at the lung bases, compatible with bronchitis or reactive airway disease. No airspace disease or pneumothorax.   Upper Abdomen: Small calcified gallstones without evidence of acute cholecystitis. Right renal cortical atrophy.   Musculoskeletal: No acute or destructive bony abnormalities. Reconstructed images demonstrate no additional findings.   IMPRESSION: 1. Bilateral bronchial wall thickening, greatest at the bases, consistent with bronchitis or reactive airway disease. 2. Trace bilateral pleural effusions. 3. Mild biapical emphysema. 4. Aortic Atherosclerosis (ICD10-I70.0). Coronary artery atherosclerosis. 5. Incidental cholelithiasis without evidence of cholecystitis.    Statin:  Yes.   Beta Blocker:  Yes.   Aspirin :  No. ACEI:  No. ARB:  No. CCB use:  Yes Other antiplatelets/anticoagulants:  Yes.   Eliquis  5 mg BID and Plavix  75 mg daily   ASSESSMENT/PLAN: This is a 73 y.o. male with medical history significant for heart failure, CAD, PAD, COPD, CKD stage IV, hyperlipidemia, CVA who presents to the ED with shortness of  breath.  Upon  further workup patient was noted to be in end-stage renal disease.  BUN of 164, creatinine of 4.61 and a GFR of 13.  Vascular surgery was consulted placed temporary dialysis catheter.  Vascular surgery plans on taking patient to the vascular lab later today on 07/16/2023 for temporary dialysis catheter placement.  At the bedside this morning I discussed in detail with the patient the procedure, benefits, risk, complications.  Patient verbalized understanding wishes to proceed.  Answered all the patient's questions this morning.  Patient does not have to be n.p.o. for this procedure.   -I discussed the case in detail with Dr. Reymundo Caulk dew MD and he agrees with the plan.   Annamaria Barrette [facility] 07/16/2023 11:14 AM

## 2023-07-16 NOTE — Progress Notes (Signed)
 Hemodialysis note  Received patient in bed to unit. Alert and oriented.  Informed consent signed and in chart.  Tx duration: 2 hours  Patient tolerated well. Transported back to room, alert without acute distress.  Report given to patient's RN.   Access used: Right Chest HD catheter Access issues: none  Total UF removed: 0 Medication(s) given:  none  Post HD weight: 81.5 kg   Jackye Masson [facility]

## 2023-07-16 NOTE — Plan of Care (Signed)
  Problem: Education: Goal: Knowledge of General Education information will improve Description: Including pain rating scale, medication(s)/side effects and non-pharmacologic comfort measures Outcome: Progressing   Problem: Clinical Measurements: Goal: Ability to maintain clinical measurements within normal limits will improve Outcome: Progressing   Problem: Clinical Measurements: Goal: Will remain free from infection Outcome: Progressing   Problem: Clinical Measurements: Goal: Respiratory complications will improve Outcome: Progressing   Problem: Coping: Goal: Level of anxiety will decrease Outcome: Progressing   Problem: Safety: Goal: Ability to remain free from injury will improve Outcome: Progressing   Problem: Respiratory: Goal: Ability to maintain a clear airway will improve Outcome: Progressing   Problem: Respiratory: Goal: Levels of oxygenation will improve Outcome: Progressing  Plan of care ongoing, see MAR, see flowsheet

## 2023-07-16 NOTE — Progress Notes (Signed)
 [facility]  ROUNDING NOTE   Subjective:  [name] was admitted to [facility] on 06/21/2023 for shortness of breath with suspect of CHF vs COPD exacerbation. Patient reports increase weight gain of 13lbs and states his concern for repeat of previous CHF exacerbation.   Patient is followed by Dr Zelda Hickman outpatient.   Update:  Patient seen sitting at bedside Alert Appetite remains poor  Creatinine 4.61 BUN 164    Objective:  Vital signs in last 24 hours:  Temp:  [97.1 F (36.2 C)-98.9 F (37.2 C)] 97.6 F (36.4 C) (04/30 1304) Pulse Rate:  [63-65] 63 (04/30 1304) Resp:  [12-20] 12 (04/30 1304) BP: (159-169)/(75-88) 160/79 (04/30 1304) SpO2:  [95 %-99 %] 97 % (04/30 1304) Weight:  [83.4 kg] 83.4 kg (04/30 0503)  Weight change: -0.029 kg Filed Weights   07/14/23 0500 07/15/23 0500 07/16/23 0503  Weight: 82.6 kg 83.4 kg 83.4 kg    Intake/Output: I/O last 3 completed shifts: In: 720 [P.O.:720] Out: 300 [Urine:300]   Intake/Output this shift:  Total I/O In: 120 [P.O.:120] Out: -   Physical Exam: General: NAD,   Head: Normocephalic, atraumatic. Moist oral mucosal membranes  Eyes: Anicteric  Lungs:  2L 02, clear to auscultation  Heart: Regular rate and rhythm  Abdomen:  Soft, nontender,   Extremities:  No peripheral edema.  Neurologic: Alert, moving all four extremities  Skin: No lesions  Access: None    Basic Metabolic Panel: Recent Labs  Lab 07/12/23 0422 07/13/23 0421 07/13/23 0424 07/14/23 0430 07/15/23 0433 07/16/23 0619  NA 134*  --  131* 132* 129* 130*  K 4.2  --  3.3* 3.2* 3.3* 3.2*  CL 98  --  94* 93* 92* 92*  CO2 25  --  23 24 25 22   GLUCOSE 129*  --  135* 138* 119* 128*  BUN 97*  --  112* 135* 139* 164*  CREATININE 3.68*  --  3.75* 3.93* 4.50* 4.61*  CALCIUM  9.0  --  8.6* 8.7* 8.6* 8.7*  MG 2.4  --   --   --   --   --   PHOS  --  6.6*  --   --   --   --     Liver Function Tests: No results for input(s): "AST",  "ALT", "ALKPHOS", "BILITOT", "PROT", "ALBUMIN" in the last 168 hours. No results for input(s): "LIPASE", "AMYLASE" in the last 168 hours. No results for input(s): "AMMONIA" in the last 168 hours.  CBC: Recent Labs  Lab 07/11/23 1045 07/12/23 0422 07/13/23 0424 07/14/23 0430 07/15/23 0433  WBC 17.1* 12.5* 11.4* 8.1 11.5*  NEUTROABS  --  11.3* 10.0* 7.3 9.6*  HGB 14.5 12.9* 12.2* 12.9* 12.3*  HCT 42.8 37.6* 34.5* 36.5* 34.6*  MCV 88.1 85.8 84.4 83.0 82.8  PLT 209 192 167 182 175    Cardiac Enzymes: No results for input(s): "CKTOTAL", "CKMB", "CKMBINDEX", "TROPONINI" in the last 168 hours.  BNP: Invalid input(s): "POCBNP"  CBG: Recent Labs  Lab 07/16/23 0801  GLUCAP 112*    Microbiology: Results for orders placed or performed during the hospital encounter of 07/11/23  Blood culture (routine x 2)     Status: None   Collection Time: 07/11/23 10:45 AM   Specimen: BLOOD RIGHT FOREARM  Result Value Ref Range Status   Specimen Description BLOOD RIGHT FOREARM  Final   Special Requests   Final    BOTTLES DRAWN AEROBIC AND ANAEROBIC Blood Culture results may not be optimal  due to an inadequate volume of blood received in culture bottles   Culture   Final    NO GROWTH 5 DAYS Performed at [facility], [address] [place], [place] [address]    Report Status 07/16/2023 FINAL  Final  Blood culture (routine x 2)     Status: None   Collection Time: 07/11/23 10:45 AM   Specimen: BLOOD LEFT FOREARM  Result Value Ref Range Status   Specimen Description BLOOD LEFT FOREARM  Final   Special Requests   Final    BOTTLES DRAWN AEROBIC AND ANAEROBIC Blood Culture adequate volume   Culture   Final    NO GROWTH 5 DAYS Performed at [facility], [address] [place], [place] [address]    Report Status 07/16/2023 FINAL  Final  Resp panel by RT-PCR (RSV, Flu A&B, Covid) Anterior Nasal Swab     Status: None   Collection Time: 07/11/23  1:53 PM   Specimen:  Anterior Nasal Swab  Result Value Ref Range Status   SARS Coronavirus 2 by RT PCR NEGATIVE NEGATIVE Final    Comment: (NOTE) SARS-CoV-2 target nucleic acids are NOT DETECTED.  The SARS-CoV-2 RNA is generally detectable in upper respiratory specimens during the acute phase of infection. The lowest concentration of SARS-CoV-2 viral copies this assay can detect is 138 copies/mL. A negative result does not preclude SARS-Cov-2 infection and should not be used as the sole basis for treatment or other patient management decisions. A negative result may occur with  improper specimen collection/handling, submission of specimen other than nasopharyngeal swab, presence of viral mutation(s) within the areas targeted by this assay, and inadequate number of viral copies(<138 copies/mL). A negative result must be combined with clinical observations, patient history, and epidemiological information. The expected result is Negative.  Fact Sheet for Patients:  BloggerCourse.com  Fact Sheet for Healthcare Providers:  SeriousBroker.it  This test is no t yet approved or cleared by the [place]  FDA and  has been authorized for detection and/or diagnosis of SARS-CoV-2 by FDA under an Emergency Use Authorization (EUA). This EUA will remain  in effect (meaning this test can be used) for the duration of the COVID-19 declaration under Section 564(b)(1) of the Act, 21 U.S.C.section 360bbb-3(b)(1), unless the authorization is terminated  or revoked sooner.       Influenza A by PCR NEGATIVE NEGATIVE Final   Influenza B by PCR NEGATIVE NEGATIVE Final    Comment: (NOTE) The Xpert Xpress SARS-CoV-2/FLU/RSV plus assay is intended as an aid in the diagnosis of influenza from Nasopharyngeal swab specimens and should not be used as a sole basis for treatment. Nasal washings and aspirates are unacceptable for Xpert Xpress SARS-CoV-2/FLU/RSV testing.  Fact  Sheet for Patients: BloggerCourse.com  Fact Sheet for Healthcare Providers: SeriousBroker.it  This test is not yet approved or cleared by the [place]  FDA and has been authorized for detection and/or diagnosis of SARS-CoV-2 by FDA under an Emergency Use Authorization (EUA). This EUA will remain in effect (meaning this test can be used) for the duration of the COVID-19 declaration under Section 564(b)(1) of the Act, 21 U.S.C. section 360bbb-3(b)(1), unless the authorization is terminated or revoked.     Resp Syncytial Virus by PCR NEGATIVE NEGATIVE Final    Comment: (NOTE) Fact Sheet for Patients: BloggerCourse.com  Fact Sheet for Healthcare Providers: SeriousBroker.it  This test is not yet approved or cleared by the [place]  FDA and has been authorized for detection and/or diagnosis of SARS-CoV-2  by FDA under an Emergency Use Authorization (EUA). This EUA will remain in effect (meaning this test can be used) for the duration of the COVID-19 declaration under Section 564(b)(1) of the Act, 21 U.S.C. section 360bbb-3(b)(1), unless the authorization is terminated or revoked.  Performed at [facility], [address] [place], [place] [address]     Coagulation Studies: No results for input(s): "LABPROT", "INR" in the last 72 hours.   Urinalysis: No results for input(s): "COLORURINE", "LABSPEC", "PHURINE", "GLUCOSEU", "HGBUR", "BILIRUBINUR", "KETONESUR", "PROTEINUR", "UROBILINOGEN", "NITRITE", "LEUKOCYTESUR" in the last 72 hours.  Invalid input(s): "APPERANCEUR"    Imaging: PERIPHERAL VASCULAR CATHETERIZATION Result Date: 07/16/2023 See surgical note for result.     Medications:      allopurinol  100 mg Oral Daily   amLODipine   5 mg Oral BID   apixaban   2.5 mg Oral BID   carvedilol   25 mg Oral BID WC   [START ON 07/17/2023] Chlorhexidine  Gluconate  Cloth  6 each Topical Q0600   cholecalciferol  1,000 Units Oral Daily   cloNIDine   0.1 mg Oral BID   cyanocobalamin   1,000 mcg Oral Daily   hydrALAZINE   100 mg Oral TID   Ipratropium-Albuterol   1 puff Inhalation Q6H   isosorbide  dinitrate  40 mg Oral TID   pantoprazole   40 mg Oral Daily   predniSONE   40 mg Oral Q breakfast   rosuvastatin   20 mg Oral Daily   sodium chloride  flush  3 mL Intravenous Q12H   acetaminophen , albuterol , alteplase, heparin , lidocaine -EPINEPHrine  (PF), ondansetron  (ZOFRAN ) IV, mouth rinse, sodium chloride  flush  Assessment/ Plan:  [name] is a 73 y.o.  male with PMH of HFrEF,CAD, PVD, CVA, COPD, stage IV CKD who is admitted to [facility] on 06/21/2022 for COPD exacerbation (HCC) [J44.1] COPD with acute exacerbation (HCC) [J44.1] Stage 4 chronic kidney disease (HCC) [N18.4] Cardiorenal syndrome [I13.10]  Acute Kidney Injury on chronic kidney disease stage IV 2/2 to acute cardiorenal syndrome: baseline creatinine of 3.54, GFR of 18. 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  Creatinine rise slightly today. The concern continues to be his elevated BUN with uremic symptoms. Due to the steady rise, we discussed with patient our desire to proceed with dialysis. He is agreeable. Vascular consulted for placement of HD temp cath. Will perform initial treatment after placement. Next treatment will be tomorrow. Staff encouraged to document all urine output.   Lab Results  Component Value Date   CREATININE 4.61 (H) 07/16/2023   CREATININE 4.50 (H) 07/15/2023   CREATININE 3.93 (H) 07/14/2023     Intake/Output Summary (Last 24 hours) at 07/16/2023 1506 Last data filed at 07/16/2023 1417 Gross per 24 hour  Intake 360 ml  Output --  Net 360 ml      Acute Respiratory distress 2/2 Acute COPD vs Acute exacerbation of chronic systolic and diastolic congestive heart failure.   -06/18/22 ECHO LV EF 50%-60%. Repeat ECHO pending results. Chest CT  ordered - Room air .   3. Anemia with chronic kidney disease:  HgB 12.3 with last check   Hypertension with chronic kidney disease. Currently on 40 mg isosorbide  dinitrate, metoprolol  25 mg daily, hydralazine  100 mg TID, amlodipine  5 mg, carvedilol  12.5 mg BID  Also prescribed Clonidine . Blood pressure remains elevated but stable  Secondary Hyperparathyroidism  Ca 8.6, Phos, PTH 124 Will continue to monitor bone minerals during this admission.    LOS: 4 Nakyra Bourn 4/30/20253:06 PM

## 2023-07-17 DIAGNOSIS — J9621 Acute and chronic respiratory failure with hypoxia: Secondary | ICD-10-CM | POA: Diagnosis not present

## 2023-07-17 LAB — BASIC METABOLIC PANEL WITH GFR
Anion gap: 13 (ref 5–15)
BUN: 111 mg/dL — ABNORMAL HIGH (ref 8–23)
CO2: 24 mmol/L (ref 22–32)
Calcium: 8.6 mg/dL — ABNORMAL LOW (ref 8.9–10.3)
Chloride: 97 mmol/L — ABNORMAL LOW (ref 98–111)
Creatinine, Ser: 3.57 mg/dL — ABNORMAL HIGH (ref 0.61–1.24)
GFR, Estimated: 17 mL/min — ABNORMAL LOW (ref >60)
Glucose, Bld: 125 mg/dL — ABNORMAL HIGH (ref 70–99)
Potassium: 3.4 mmol/L — ABNORMAL LOW (ref 3.5–5.1)
Sodium: 134 mmol/L — ABNORMAL LOW (ref 135–145)

## 2023-07-17 LAB — HEPATITIS B CORE ANTIBODY, TOTAL: HEP B CORE AB: NEGATIVE

## 2023-07-17 LAB — PHOSPHORUS: Phosphorus: 5.9 mg/dL — ABNORMAL HIGH (ref 2.5–4.6)

## 2023-07-17 LAB — CBC
HCT: 32.8 % — ABNORMAL LOW (ref 39.0–52.0)
Hemoglobin: 11.6 g/dL — ABNORMAL LOW (ref 13.0–17.0)
MCH: 30.1 pg (ref 26.0–34.0)
MCHC: 35.4 g/dL (ref 30.0–36.0)
MCV: 85 fL (ref 80.0–100.0)
Platelets: 142 10*3/uL — ABNORMAL LOW (ref 150–400)
RBC: 3.86 MIL/uL — ABNORMAL LOW (ref 4.22–5.81)
RDW: 14.9 % (ref 11.5–15.5)
WBC: 11.8 10*3/uL — ABNORMAL HIGH (ref 4.0–10.5)
nRBC: 0 % (ref 0.0–0.2)

## 2023-07-17 LAB — HEPATITIS B SURFACE ANTIBODY, QUANTITATIVE: Hep B S AB Quant (Post): <3.5 m[IU]/mL — ABNORMAL LOW

## 2023-07-17 MED ORDER — HEPARIN SODIUM (PORCINE) 1000 UNIT/ML IJ SOLN
INTRAMUSCULAR | Status: AC
  Filled 2023-07-17: qty 10

## 2023-07-17 NOTE — Progress Notes (Signed)
 Hemodialysis Note:  Received patient in bed to unit. Alert and oriented. Informed consent singed and in chart.  Treatment initiated: 0758 Treatment completed: 1030  Access used: Right Femoral catheter Access issues: None  Patient tolerated well. Transported back to room, alert without acute distress. Report given to patient's RN.  Total UF removed: 0 Medications given: None  Post HD weight: 81.4 Kg  [facility]

## 2023-07-17 NOTE — Plan of Care (Signed)
  Problem: Education: Goal: Knowledge of General Education information will improve Description: Including pain rating scale, medication(s)/side effects and non-pharmacologic comfort measures Outcome: Progressing   Problem: Health Behavior/Discharge Planning: Goal: Ability to manage health-related needs will improve Outcome: Progressing   Problem: Clinical Measurements: Goal: Will remain free from infection Outcome: Progressing   Problem: Clinical Measurements: Goal: Diagnostic test results will improve Outcome: Progressing   Problem: Coping: Goal: Level of anxiety will decrease Outcome: Progressing   Problem: Safety: Goal: Ability to remain free from injury will improve Outcome: Progressing   Problem: Education: Goal: Knowledge of disease or condition will improve Outcome: Progressing   Problem: Respiratory: Goal: Ability to maintain a clear airway will improve Outcome: Progressing   Plan of care onging, see MAR see flowsheet

## 2023-07-17 NOTE — Progress Notes (Signed)
 Entered room to give patient evening medications. Pt noted to be eating a burger from [employer] and fries with a large dark soda. Pt educated on renal diet and need to protect his kidneys. Pt stated "Well I needed something to eat that tasted good". Again attempted to educate patient. Pt shrugged his shoulders. No family at bedside at this time. NAD. VSS.

## 2023-07-17 NOTE — Progress Notes (Signed)
 PROGRESS NOTE    Gary Mccoy.   MWU:[id number] DOB: 1950/11/09  DOA: 07/11/2023 Date of Service: 07/17/23 which is hospital day 5  PCP: Sari Cunning, MD    Hospital course / significant events:   HPI: 73yo with h/o HFrEF, CAD/PAD, carotid stenosis, stage IV CKD, COPD, HLD, and CVA who presented on 4/25 with SOB.    04/25: admitted to hospitalist w/ resp fail requiring BiPap, d/t COPD and HFrEF exacerbations.  04/26: on [employer] O2, Nephrology consult for cardiorenal syndrome / advancing CKD. Started lasix  drip. Echo done.  04/27: continue lasix  drip 04/28: lasix  drip d/c  04/29: no immediate dialysis need but monitoring off lasix   04/30: nephro recs for dialysis, which was started today 05/01: dialysis      Consultants:  Nephrology  Vascular surgery   Procedures/Surgeries: 07/16/23 R femoral temporary dialysis catheter placed      ASSESSMENT & PLAN:   Acute hypoxic respiratory failure Due to acute on chronic heart failure +/- bronchitis/COPD exacerbation  Required BiPAP transiently on presentation, weaned to [place] O2 O2 support as needed  Treat underlying cause(s)   Cardiorenal syndrome complicated by stage IV CKD question progression to ESRD  Patient's renal dysfunction appears to be stable, but he appears unable to effectively balance CHF associated with volume overload, not improved with Lasix  Nephrology following  Dialysis initiated yesterday - plan 3rd treatment tomorrow then will leave off and evaluate renal function to determine need for further dialysis  Monitor BMP   Acute on chronic combined CHF Echo with EF 45-50%, global hypokinesis, moderate to severe LV dilatation, grade 1 DD IV Lasix  --> Lasix  infusion stopped 4/28 --> off diuresis and monitoring renal fxn  Dialysis initiated yesterday no fluid removed yesterday or today   Strict I&O    HLD Crestor    HTN Hold amlodipine  was taking carvedilol , clonidine , hydralazine , Toprol  XL -->  duplicate beta blocker, d/c metoprolol  and will continue coreg      COPD w/ questionable exacerbation given shortness of breath and cough but SOB worsened despite Levaquin and steroids x 4 days PTA, more likely d/t CHF  Combivent  prednisone  taper Albuterol  q2h prn No current wheezing noted   CAD S/P percutaneous coronary angioplasty No chest pain reported at this time Troponin is elevated, however improved compared to prior Continue Eliquis , no ASA Carvedilol   No ACE/ARB d/t renal function at this time  Statin  Isosorbide     History of CVA and bilateral carotid artery stenosis (cerebrovascular accident) History of CVA with carotid artery stenosis, as well as extensive PAD PCP recently stopped Plavix  Continue Eliquis   Hx gout Allopurinol  renal dose  GERD PPI  No concerns based on BMI: Body mass index is 24.25 kg/m.[name] Significantly low or high BMI is associated with higher medical risk.  Underweight - under 18  overweight - 25 to 29 obese - 30 or more Class 1 obesity: BMI of 30.0 to 34 Class 2 obesity: BMI of 35.0 to 39 Class 3 obesity: BMI of 40.0 to 49 Super Morbid Obesity: BMI 50-59 Super-super Morbid Obesity: BMI 60+ Healthy nutrition and physical activity advised as adjunct to other disease management and risk reduction treatments    DVT prophylaxis: heparin  IV fluids: no continuous IV fluids  Nutrition: cardiac/carb Central lines / other devices: tempcath for HD  Code Status: FULL CODE ACP documentation reviewed: none on file in [facility]  TOC needs: TBD Medical barriers to dispo: dialysis starting. Expected medical readiness for discharge several more days at soonest.  Subjective / Brief ROS:  Patient reports no concerns today Seen following dialysis  Denies CP/SOB.  Pain controlled.  Denies new weakness.    Family Communication: none at bedside     Objective Findings:  Vitals:   07/17/23 0930 07/17/23 1000 07/17/23 1030  07/17/23 1226  BP: (!) 172/94 (!) 173/82 (!) 180/86 (!) 150/80  Pulse: 65 67 64 68  Resp: 14 (!) 22 14   Temp:    97.9 F (36.6 C)  TempSrc:      SpO2: 95% 95% 96% 93%  Weight:   81.4 kg   Height:        Intake/Output Summary (Last 24 hours) at 07/17/2023 1357 Last data filed at 07/17/2023 1030 Gross per 24 hour  Intake 120 ml  Output 0 ml  Net 120 ml   Filed Weights   07/17/23 0523 07/17/23 0739 07/17/23 1030  Weight: 83.7 kg 81.4 kg 81.4 kg    Examination:  Physical Exam Constitutional:      General: He is not in acute distress. Cardiovascular:     Rate and Rhythm: Normal rate and regular rhythm.  Pulmonary:     Effort: Pulmonary effort is normal. No respiratory distress.  Skin:    General: Skin is warm and dry.  Neurological:     General: No focal deficit present.     Mental Status: He is alert and oriented to person, place, and time. Mental status is at baseline.  Psychiatric:        Behavior: Behavior normal.          Scheduled Medications:   allopurinol   100 mg Oral Daily   amLODipine   5 mg Oral BID   apixaban   2.5 mg Oral BID   carvedilol   25 mg Oral BID WC   Chlorhexidine  Gluconate Cloth  6 each Topical Q0600   cholecalciferol   1,000 Units Oral Daily   cloNIDine   0.1 mg Oral BID   cyanocobalamin   1,000 mcg Oral Daily   hydrALAZINE   100 mg Oral TID   Ipratropium-Albuterol   1 puff Inhalation Q6H   isosorbide  dinitrate  40 mg Oral TID   pantoprazole   40 mg Oral Daily   polyethylene glycol  17 g Oral BID   predniSONE   20 mg Oral Q breakfast   rosuvastatin   20 mg Oral Daily   sodium chloride  flush  3 mL Intravenous Q12H    Continuous Infusions:   PRN Medications:  acetaminophen , albuterol , bisacodyl , lidocaine -EPINEPHrine  (PF), ondansetron  (ZOFRAN ) IV, mouth rinse, sodium chloride  flush  Antimicrobials from admission:  Anti-infectives (From admission, onward)    None           Data Reviewed:  I have personally reviewed the  following...  CBC: Recent Labs  Lab 07/12/23 0422 07/13/23 0424 07/14/23 0430 07/15/23 0433 07/17/23 0759  WBC 12.5* 11.4* 8.1 11.5* 11.8*  NEUTROABS 11.3* 10.0* 7.3 9.6*  --   HGB 12.9* 12.2* 12.9* 12.3* 11.6*  HCT 37.6* 34.5* 36.5* 34.6* 32.8*  MCV 85.8 84.4 83.0 82.8 85.0  PLT 192 167 182 175 142*   Basic Metabolic Panel: Recent Labs  Lab 07/12/23 0422 07/13/23 0421 07/13/23 0424 07/14/23 0430 07/15/23 0433 07/16/23 0619 07/17/23 0344  NA 134*  --  131* 132* 129* 130* 134*  K 4.2  --  3.3* 3.2* 3.3* 3.2* 3.4*  CL 98  --  94* 93* 92* 92* 97*  CO2 25  --  23 24 25 22 24   GLUCOSE 129*  --  135* 138*  119* 128* 125*  BUN 97*  --  112* 135* 139* 164* 111*  CREATININE 3.68*  --  3.75* 3.93* 4.50* 4.61* 3.57*  CALCIUM  9.0  --  8.6* 8.7* 8.6* 8.7* 8.6*  MG 2.4  --   --   --   --   --   --   PHOS  --  6.6*  --   --   --   --  5.9*   GFR: Estimated Creatinine Clearance: 21.4 mL/min (A) (by C-G formula based on SCr of 3.57 mg/dL (H)). Liver Function Tests: No results for input(s): "AST", "ALT", "ALKPHOS", "BILITOT", "PROT", "ALBUMIN" in the last 168 hours. No results for input(s): "LIPASE", "AMYLASE" in the last 168 hours. No results for input(s): "AMMONIA" in the last 168 hours. Coagulation Profile: Recent Labs  Lab 07/11/23 1045  INR 1.2   Cardiac Enzymes: No results for input(s): "CKTOTAL", "CKMB", "CKMBINDEX", "TROPONINI" in the last 168 hours. BNP (last 3 results) No results for input(s): "PROBNP" in the last 8760 hours. HbA1C: No results for input(s): "HGBA1C" in the last 72 hours. CBG: Recent Labs  Lab 07/16/23 0801  GLUCAP 112*   Lipid Profile: No results for input(s): "CHOL", "HDL", "LDLCALC", "TRIG", "CHOLHDL", "LDLDIRECT" in the last 72 hours. Thyroid  Function Tests: No results for input(s): "TSH", "T4TOTAL", "FREET4", "T3FREE", "THYROIDAB" in the last 72 hours. Anemia Panel: No results for input(s): "VITAMINB12", "FOLATE", "FERRITIN", "TIBC",  "IRON", "RETICCTPCT" in the last 72 hours. Most Recent Urinalysis On File:     Component Value Date/Time   COLORURINE STRAW (A) 10/29/2021 0030   APPEARANCEUR CLEAR (A) 10/29/2021 0030   LABSPEC 1.005 10/29/2021 0030   PHURINE 5.0 10/29/2021 0030   GLUCOSEU NEGATIVE 10/29/2021 0030   HGBUR NEGATIVE 10/29/2021 0030   BILIRUBINUR NEGATIVE 10/29/2021 0030   KETONESUR NEGATIVE 10/29/2021 0030   PROTEINUR NEGATIVE 10/29/2021 0030   NITRITE NEGATIVE 10/29/2021 0030   LEUKOCYTESUR NEGATIVE 10/29/2021 0030   Sepsis Labs: @LABRCNTIP (procalcitonin:4,lacticidven:4) Microbiology: Recent Results (from the past 240 hours)  Blood culture (routine x 2)     Status: None   Collection Time: 07/11/23 10:45 AM   Specimen: BLOOD RIGHT FOREARM  Result Value Ref Range Status   Specimen Description BLOOD RIGHT FOREARM  Final   Special Requests   Final    BOTTLES DRAWN AEROBIC AND ANAEROBIC Blood Culture results may not be optimal due to an inadequate volume of blood received in culture bottles   Culture   Final    NO GROWTH 5 DAYS Performed at [facility], [address] [place], [place] [address]    Report Status 07/16/2023 FINAL  Final  Blood culture (routine x 2)     Status: None   Collection Time: 07/11/23 10:45 AM   Specimen: BLOOD LEFT FOREARM  Result Value Ref Range Status   Specimen Description BLOOD LEFT FOREARM  Final   Special Requests   Final    BOTTLES DRAWN AEROBIC AND ANAEROBIC Blood Culture adequate volume   Culture   Final    NO GROWTH 5 DAYS Performed at [facility], [address] [place], [place] [address]    Report Status 07/16/2023 FINAL  Final  Resp panel by RT-PCR (RSV, Flu A&B, Covid) Anterior Nasal Swab     Status: None   Collection Time: 07/11/23  1:53 PM   Specimen: Anterior Nasal Swab  Result Value Ref Range Status   SARS Coronavirus 2 by RT PCR NEGATIVE NEGATIVE Final    Comment: (NOTE) SARS-CoV-2 target  nucleic acids are NOT  DETECTED.  The SARS-CoV-2 RNA is generally detectable in upper respiratory specimens during the acute phase of infection. The lowest concentration of SARS-CoV-2 viral copies this assay can detect is 138 copies/mL. A negative result does not preclude SARS-Cov-2 infection and should not be used as the sole basis for treatment or other patient management decisions. A negative result may occur with  improper specimen collection/handling, submission of specimen other than nasopharyngeal swab, presence of viral mutation(s) within the areas targeted by this assay, and inadequate number of viral copies(<138 copies/mL). A negative result must be combined with clinical observations, patient history, and epidemiological information. The expected result is Negative.  Fact Sheet for Patients:  BloggerCourse.com  Fact Sheet for Healthcare Providers:  SeriousBroker.it  This test is no t yet approved or cleared by the [place]  FDA and  has been authorized for detection and/or diagnosis of SARS-CoV-2 by FDA under an Emergency Use Authorization (EUA). This EUA will remain  in effect (meaning this test can be used) for the duration of the COVID-19 declaration under Section 564(b)(1) of the Act, 21 U.S.C.section 360bbb-3(b)(1), unless the authorization is terminated  or revoked sooner.       Influenza A by PCR NEGATIVE NEGATIVE Final   Influenza B by PCR NEGATIVE NEGATIVE Final    Comment: (NOTE) The Xpert Xpress SARS-CoV-2/FLU/RSV plus assay is intended as an aid in the diagnosis of influenza from Nasopharyngeal swab specimens and should not be used as a sole basis for treatment. Nasal washings and aspirates are unacceptable for Xpert Xpress SARS-CoV-2/FLU/RSV testing.  Fact Sheet for Patients: BloggerCourse.com  Fact Sheet for Healthcare Providers: SeriousBroker.it  This test is not yet  approved or cleared by the [place]  FDA and has been authorized for detection and/or diagnosis of SARS-CoV-2 by FDA under an Emergency Use Authorization (EUA). This EUA will remain in effect (meaning this test can be used) for the duration of the COVID-19 declaration under Section 564(b)(1) of the Act, 21 U.S.C. section 360bbb-3(b)(1), unless the authorization is terminated or revoked.     Resp Syncytial Virus by PCR NEGATIVE NEGATIVE Final    Comment: (NOTE) Fact Sheet for Patients: BloggerCourse.com  Fact Sheet for Healthcare Providers: SeriousBroker.it  This test is not yet approved or cleared by the [place]  FDA and has been authorized for detection and/or diagnosis of SARS-CoV-2 by FDA under an Emergency Use Authorization (EUA). This EUA will remain in effect (meaning this test can be used) for the duration of the COVID-19 declaration under Section 564(b)(1) of the Act, 21 U.S.C. section 360bbb-3(b)(1), unless the authorization is terminated or revoked.  Performed at [facility], [address] [place], [place] [address]       Radiology Studies last 3 days: PERIPHERAL VASCULAR CATHETERIZATION Result Date: 07/16/2023 See surgical note for result.        Toney Lizaola, DO [facility] 07/17/2023, 1:57 PM    Dictation software may have been used to generate the above note. Typos may occur and escape review in typed/dictated notes. Please contact Dr Authur Leghorn directly for clarity if needed.  Staff may message me via secure chat in Epic  but this may not receive an immediate response,  please page me for urgent matters!  If 7PM-7AM, please contact night coverage [email or web address]

## 2023-07-17 NOTE — Progress Notes (Signed)
 [facility]  ROUNDING NOTE   Subjective:  [name] was admitted to [facility] on 06/21/2023 for shortness of breath with suspect of CHF vs COPD exacerbation. Patient reports increase weight gain of 13lbs and states his concern for repeat of previous CHF exacerbation.   Patient is followed by Dr Zelda Hickman outpatient.   Update:  Patient seen and evaluated during dialysis   HEMODIALYSIS FLOWSHEET:  Blood Flow Rate (mL/min): 250 mL/min Arterial Pressure (mmHg): -112.32 mmHg Venous Pressure (mmHg): 113.13 mmHg TMP (mmHg): 20.6 mmHg Ultrafiltration Rate (mL/min): 440 mL/min Dialysate Flow Rate (mL/min): 300 ml/min  States he feels well today No complaints to offer    Objective:  Vital signs in last 24 hours:  Temp:  [97.6 F (36.4 C)-98.6 F (37 C)] 97.9 F (36.6 C) (05/01 1226) Pulse Rate:  [62-72] 68 (05/01 1226) Resp:  [12-26] 14 (05/01 1030) BP: (100-182)/(73-94) 150/80 (05/01 1226) SpO2:  [93 %-100 %] 93 % (05/01 1226) Weight:  [81.4 kg-83.7 kg] 81.4 kg (05/01 1030)  Weight change: -1.871 kg Filed Weights   07/17/23 0523 07/17/23 0739 07/17/23 1030  Weight: 83.7 kg 81.4 kg 81.4 kg    Intake/Output: I/O last 3 completed shifts: In: 360 [P.O.:360] Out: 0    Intake/Output this shift:  No intake/output data recorded.  Physical Exam: General: NAD  Head: Normocephalic, atraumatic. Moist oral mucosal membranes  Eyes: Anicteric  Lungs:  2L 02, clear to auscultation  Heart: Regular rate and rhythm  Abdomen:  Soft, nontender,   Extremities:  No peripheral edema.  Neurologic: Alert, moving all four extremities  Skin: No lesions  Access: None    Basic Metabolic Panel: Recent Labs  Lab 07/12/23 0422 07/13/23 0421 07/13/23 0424 07/14/23 0430 07/15/23 0433 07/16/23 0619 07/17/23 0344  NA 134*  --  131* 132* 129* 130* 134*  K 4.2  --  3.3* 3.2* 3.3* 3.2* 3.4*  CL 98  --  94* 93* 92* 92* 97*  CO2 25  --  23 24 25 22 24   GLUCOSE 129*  --   135* 138* 119* 128* 125*  BUN 97*  --  112* 135* 139* 164* 111*  CREATININE 3.68*  --  3.75* 3.93* 4.50* 4.61* 3.57*  CALCIUM  9.0  --  8.6* 8.7* 8.6* 8.7* 8.6*  MG 2.4  --   --   --   --   --   --   PHOS  --  6.6*  --   --   --   --   --     Liver Function Tests: No results for input(s): "AST", "ALT", "ALKPHOS", "BILITOT", "PROT", "ALBUMIN" in the last 168 hours. No results for input(s): "LIPASE", "AMYLASE" in the last 168 hours. No results for input(s): "AMMONIA" in the last 168 hours.  CBC: Recent Labs  Lab 07/12/23 0422 07/13/23 0424 07/14/23 0430 07/15/23 0433 07/17/23 0759  WBC 12.5* 11.4* 8.1 11.5* 11.8*  NEUTROABS 11.3* 10.0* 7.3 9.6*  --   HGB 12.9* 12.2* 12.9* 12.3* 11.6*  HCT 37.6* 34.5* 36.5* 34.6* 32.8*  MCV 85.8 84.4 83.0 82.8 85.0  PLT 192 167 182 175 142*    Cardiac Enzymes: No results for input(s): "CKTOTAL", "CKMB", "CKMBINDEX", "TROPONINI" in the last 168 hours.  BNP: Invalid input(s): "POCBNP"  CBG: Recent Labs  Lab 07/16/23 0801  GLUCAP 112*    Microbiology: Results for orders placed or performed during the hospital encounter of 07/11/23  Blood culture (routine x 2)     Status: None  Collection Time: 07/11/23 10:45 AM   Specimen: BLOOD RIGHT FOREARM  Result Value Ref Range Status   Specimen Description BLOOD RIGHT FOREARM  Final   Special Requests   Final    BOTTLES DRAWN AEROBIC AND ANAEROBIC Blood Culture results may not be optimal due to an inadequate volume of blood received in culture bottles   Culture   Final    NO GROWTH 5 DAYS Performed at [facility], [address], [place], [place] [address]    Report Status 07/16/2023 FINAL  Final  Blood culture (routine x 2)     Status: None   Collection Time: 07/11/23 10:45 AM   Specimen: BLOOD LEFT FOREARM  Result Value Ref Range Status   Specimen Description BLOOD LEFT FOREARM  Final   Special Requests   Final    BOTTLES DRAWN AEROBIC AND ANAEROBIC Blood Culture adequate  volume   Culture   Final    NO GROWTH 5 DAYS Performed at [facility], [address] [place], [place] [address]    Report Status 07/16/2023 FINAL  Final  Resp panel by RT-PCR (RSV, Flu A&B, Covid) Anterior Nasal Swab     Status: None   Collection Time: 07/11/23  1:53 PM   Specimen: Anterior Nasal Swab  Result Value Ref Range Status   SARS Coronavirus 2 by RT PCR NEGATIVE NEGATIVE Final    Comment: (NOTE) SARS-CoV-2 target nucleic acids are NOT DETECTED.  The SARS-CoV-2 RNA is generally detectable in upper respiratory specimens during the acute phase of infection. The lowest concentration of SARS-CoV-2 viral copies this assay can detect is 138 copies/mL. A negative result does not preclude SARS-Cov-2 infection and should not be used as the sole basis for treatment or other patient management decisions. A negative result may occur with  improper specimen collection/handling, submission of specimen other than nasopharyngeal swab, presence of viral mutation(s) within the areas targeted by this assay, and inadequate number of viral copies(<138 copies/mL). A negative result must be combined with clinical observations, patient history, and epidemiological information. The expected result is Negative.  Fact Sheet for Patients:  BloggerCourse.com  Fact Sheet for Healthcare Providers:  SeriousBroker.it  This test is no t yet approved or cleared by the [place]  FDA and  has been authorized for detection and/or diagnosis of SARS-CoV-2 by FDA under an Emergency Use Authorization (EUA). This EUA will remain  in effect (meaning this test can be used) for the duration of the COVID-19 declaration under Section 564(b)(1) of the Act, 21 U.S.C.section 360bbb-3(b)(1), unless the authorization is terminated  or revoked sooner.       Influenza A by PCR NEGATIVE NEGATIVE Final   Influenza B by PCR NEGATIVE NEGATIVE Final     Comment: (NOTE) The Xpert Xpress SARS-CoV-2/FLU/RSV plus assay is intended as an aid in the diagnosis of influenza from Nasopharyngeal swab specimens and should not be used as a sole basis for treatment. Nasal washings and aspirates are unacceptable for Xpert Xpress SARS-CoV-2/FLU/RSV testing.  Fact Sheet for Patients: BloggerCourse.com  Fact Sheet for Healthcare Providers: SeriousBroker.it  This test is not yet approved or cleared by the [place]  FDA and has been authorized for detection and/or diagnosis of SARS-CoV-2 by FDA under an Emergency Use Authorization (EUA). This EUA will remain in effect (meaning this test can be used) for the duration of the COVID-19 declaration under Section 564(b)(1) of the Act, 21 U.S.C. section 360bbb-3(b)(1), unless the authorization is terminated or revoked.     Resp  Syncytial Virus by PCR NEGATIVE NEGATIVE Final    Comment: (NOTE) Fact Sheet for Patients: BloggerCourse.com  Fact Sheet for Healthcare Providers: SeriousBroker.it  This test is not yet approved or cleared by the [place]  FDA and has been authorized for detection and/or diagnosis of SARS-CoV-2 by FDA under an Emergency Use Authorization (EUA). This EUA will remain in effect (meaning this test can be used) for the duration of the COVID-19 declaration under Section 564(b)(1) of the Act, 21 U.S.C. section 360bbb-3(b)(1), unless the authorization is terminated or revoked.  Performed at [facility], [address] [place], [place] [address]     Coagulation Studies: No results for input(s): "LABPROT", "INR" in the last 72 hours.   Urinalysis: No results for input(s): "COLORURINE", "LABSPEC", "PHURINE", "GLUCOSEU", "HGBUR", "BILIRUBINUR", "KETONESUR", "PROTEINUR", "UROBILINOGEN", "NITRITE", "LEUKOCYTESUR" in the last 72 hours.  Invalid input(s):  "APPERANCEUR"    Imaging: PERIPHERAL VASCULAR CATHETERIZATION Result Date: 07/16/2023 See surgical note for result.     Medications:      allopurinol   100 mg Oral Daily   amLODipine   5 mg Oral BID   apixaban   2.5 mg Oral BID   carvedilol   25 mg Oral BID WC   Chlorhexidine  Gluconate Cloth  6 each Topical Q0600   cholecalciferol   1,000 Units Oral Daily   cloNIDine   0.1 mg Oral BID   cyanocobalamin   1,000 mcg Oral Daily   hydrALAZINE   100 mg Oral TID   Ipratropium-Albuterol   1 puff Inhalation Q6H   isosorbide  dinitrate  40 mg Oral TID   pantoprazole   40 mg Oral Daily   polyethylene glycol  17 g Oral BID   predniSONE   20 mg Oral Q breakfast   rosuvastatin   20 mg Oral Daily   sodium chloride  flush  3 mL Intravenous Q12H   acetaminophen , albuterol , bisacodyl , lidocaine -EPINEPHrine  (PF), ondansetron  (ZOFRAN ) IV, mouth rinse, sodium chloride  flush  Assessment/ Plan:  [name] is a 73 y.o.  male with PMH of HFrEF,CAD, PVD, CVA, COPD, stage IV CKD who is admitted to [facility] on 06/21/2022 for COPD exacerbation (HCC) [J44.1] COPD with acute exacerbation (HCC) [J44.1] Stage 4 chronic kidney disease (HCC) [N18.4] Cardiorenal syndrome [I13.10]  Acute Kidney Injury on chronic kidney disease stage IV 2/2 to acute cardiorenal syndrome: baseline creatinine of 3.54, GFR of 18. 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  Appreciate vascular placing HD temp cath on 4/30.  Patient received initial dialysis treatment yesterday, tolerated well.  Second treatment scheduled for today, no UF.  Will plan for third dialysis treatment tomorrow.  Will hold dialysis at that time and evaluate renal recovery.  Lab Results  Component Value Date   CREATININE 3.57 (H) 07/17/2023   CREATININE 4.61 (H) 07/16/2023   CREATININE 4.50 (H) 07/15/2023     Intake/Output Summary (Last 24 hours) at 07/17/2023 1305 Last data filed at 07/17/2023 1030 Gross per 24 hour  Intake 120 ml   Output 0 ml  Net 120 ml      Acute Respiratory distress 2/2 Acute COPD vs Acute exacerbation of chronic systolic and diastolic congestive heart failure.   -06/18/22 ECHO LV EF 50%-60%. Repeat ECHO pending results. Chest CT ordered - Room air .   3. Anemia with chronic kidney disease:  HgB 11.6 with a.m. labs   Hypertension with chronic kidney disease. Currently on 40 mg isosorbide  dinitrate, metoprolol  25 mg daily, hydralazine  100 mg TID, amlodipine  5 mg, carvedilol  12.5 mg BID, and clonidine   Blood pressure 172/94  during dialysis.  Secondary Hyperparathyroidism  Ca 8.6, Phos, PTH 124 Calcium  acceptable, awaiting phosphorus levels.   LOS: 5 Gary Mccoy 5/1/20251:05 PM

## 2023-07-18 DIAGNOSIS — J9621 Acute and chronic respiratory failure with hypoxia: Secondary | ICD-10-CM | POA: Diagnosis not present

## 2023-07-18 LAB — CBC
HCT: 34.1 % — ABNORMAL LOW (ref 39.0–52.0)
Hemoglobin: 11.7 g/dL — ABNORMAL LOW (ref 13.0–17.0)
MCH: 29.7 pg (ref 26.0–34.0)
MCHC: 34.3 g/dL (ref 30.0–36.0)
MCV: 86.5 fL (ref 80.0–100.0)
Platelets: 141 10*3/uL — ABNORMAL LOW (ref 150–400)
RBC: 3.94 MIL/uL — ABNORMAL LOW (ref 4.22–5.81)
RDW: 15 % (ref 11.5–15.5)
WBC: 14.5 10*3/uL — ABNORMAL HIGH (ref 4.0–10.5)
nRBC: 0 % (ref 0.0–0.2)

## 2023-07-18 LAB — BASIC METABOLIC PANEL WITH GFR
Anion gap: 11 (ref 5–15)
BUN: 82 mg/dL — ABNORMAL HIGH (ref 8–23)
CO2: 25 mmol/L (ref 22–32)
Calcium: 8.5 mg/dL — ABNORMAL LOW (ref 8.9–10.3)
Chloride: 95 mmol/L — ABNORMAL LOW (ref 98–111)
Creatinine, Ser: 3.01 mg/dL — ABNORMAL HIGH (ref 0.61–1.24)
GFR, Estimated: 21 mL/min — ABNORMAL LOW (ref >60)
Glucose, Bld: 111 mg/dL — ABNORMAL HIGH (ref 70–99)
Potassium: 3.3 mmol/L — ABNORMAL LOW (ref 3.5–5.1)
Sodium: 131 mmol/L — ABNORMAL LOW (ref 135–145)

## 2023-07-18 MED ORDER — ALTEPLASE 2 MG IJ SOLR: 2.0000 mg | Freq: Once | INTRAMUSCULAR | Status: DC | PRN

## 2023-07-18 MED ORDER — HEPARIN SODIUM (PORCINE) 1000 UNIT/ML DIALYSIS: 1000.0000 [IU] | INTRAMUSCULAR | Status: DC | PRN

## 2023-07-18 MED ORDER — HEPARIN SODIUM (PORCINE) 1000 UNIT/ML IJ SOLN
INTRAMUSCULAR | Status: AC
  Filled 2023-07-18: qty 10

## 2023-07-18 NOTE — Progress Notes (Signed)
 [facility]  ROUNDING NOTE   Subjective:  [name] was admitted to [facility] on 06/21/2023 for shortness of breath with suspect of CHF vs COPD exacerbation. Patient reports increase weight gain of 13lbs and states his concern for repeat of previous CHF exacerbation.   Patient is followed by Dr Zelda Hickman outpatient.   Update:  Patient seen and evaluated during dialysis   HEMODIALYSIS FLOWSHEET:  Blood Flow Rate (mL/min): 300 mL/min Arterial Pressure (mmHg): -134.74 mmHg Venous Pressure (mmHg): 143.83 mmHg TMP (mmHg): 14.14 mmHg Ultrafiltration Rate (mL/min): 600 mL/min Dialysate Flow Rate (mL/min): 300 ml/min  Resting well during dialysis     Objective:  Vital signs in last 24 hours:  Temp:  [97.8 F (36.6 C)-98.1 F (36.7 C)] 98 F (36.7 C) (05/02 0929) Pulse Rate:  [62-79] 79 (05/02 1100) Resp:  [15-23] 15 (05/02 1100) BP: (150-181)/(75-85) 172/85 (05/02 1100) SpO2:  [92 %-97 %] 93 % (05/02 1100) Weight:  [80.1 kg-81.3 kg] 81.3 kg (05/02 0929)  Weight change: -0.1 kg Filed Weights   07/17/23 1030 07/18/23 0500 07/18/23 0929  Weight: 81.4 kg 80.1 kg 81.3 kg    Intake/Output: I/O last 3 completed shifts: In: -  Out: 100 [Urine:100]   Intake/Output this shift:  No intake/output data recorded.  Physical Exam: General: NAD  Head: Normocephalic, atraumatic. Moist oral mucosal membranes  Eyes: Anicteric  Lungs:  2L 02, clear to auscultation  Heart: Regular rate and rhythm  Abdomen:  Soft, nontender,   Extremities:  No peripheral edema.  Neurologic: Alert, moving all four extremities  Skin: No lesions  Access: None    Basic Metabolic Panel: Recent Labs  Lab 07/12/23 0422 07/13/23 0421 07/13/23 0424 07/14/23 0430 07/15/23 0433 07/16/23 0619 07/17/23 0344 07/18/23 0423  NA 134*  --    < > 132* 129* 130* 134* 131*  K 4.2  --    < > 3.2* 3.3* 3.2* 3.4* 3.3*  CL 98  --    < > 93* 92* 92* 97* 95*  CO2 25  --    < > 24 25 22 24 25    GLUCOSE 129*  --    < > 138* 119* 128* 125* 111*  BUN 97*  --    < > 135* 139* 164* 111* 82*  CREATININE 3.68*  --    < > 3.93* 4.50* 4.61* 3.57* 3.01*  CALCIUM  9.0  --    < > 8.7* 8.6* 8.7* 8.6* 8.5*  MG 2.4  --   --   --   --   --   --   --   PHOS  --  6.6*  --   --   --   --  5.9*  --    < > = values in this interval not displayed.    Liver Function Tests: No results for input(s): "AST", "ALT", "ALKPHOS", "BILITOT", "PROT", "ALBUMIN" in the last 168 hours. No results for input(s): "LIPASE", "AMYLASE" in the last 168 hours. No results for input(s): "AMMONIA" in the last 168 hours.  CBC: Recent Labs  Lab 07/12/23 0422 07/13/23 0424 07/14/23 0430 07/15/23 0433 07/17/23 0759 07/18/23 0951  WBC 12.5* 11.4* 8.1 11.5* 11.8* 14.5*  NEUTROABS 11.3* 10.0* 7.3 9.6*  --   --   HGB 12.9* 12.2* 12.9* 12.3* 11.6* 11.7*  HCT 37.6* 34.5* 36.5* 34.6* 32.8* 34.1*  MCV 85.8 84.4 83.0 82.8 85.0 86.5  PLT 192 167 182 175 142* 141*    Cardiac Enzymes: No results  for input(s): "CKTOTAL", "CKMB", "CKMBINDEX", "TROPONINI" in the last 168 hours.  BNP: Invalid input(s): "POCBNP"  CBG: Recent Labs  Lab 07/16/23 0801  GLUCAP 112*    Microbiology: Results for orders placed or performed during the hospital encounter of 07/11/23  Blood culture (routine x 2)     Status: None   Collection Time: 07/11/23 10:45 AM   Specimen: BLOOD RIGHT FOREARM  Result Value Ref Range Status   Specimen Description BLOOD RIGHT FOREARM  Final   Special Requests   Final    BOTTLES DRAWN AEROBIC AND ANAEROBIC Blood Culture results may not be optimal due to an inadequate volume of blood received in culture bottles   Culture   Final    NO GROWTH 5 DAYS Performed at [facility], [address] [place], [place] [address]    Report Status 07/16/2023 FINAL  Final  Blood culture (routine x 2)     Status: None   Collection Time: 07/11/23 10:45 AM   Specimen: BLOOD LEFT FOREARM  Result Value Ref Range  Status   Specimen Description BLOOD LEFT FOREARM  Final   Special Requests   Final    BOTTLES DRAWN AEROBIC AND ANAEROBIC Blood Culture adequate volume   Culture   Final    NO GROWTH 5 DAYS Performed at [facility], [address] [place], [place] [address]    Report Status 07/16/2023 FINAL  Final  Resp panel by RT-PCR (RSV, Flu A&B, Covid) Anterior Nasal Swab     Status: None   Collection Time: 07/11/23  1:53 PM   Specimen: Anterior Nasal Swab  Result Value Ref Range Status   SARS Coronavirus 2 by RT PCR NEGATIVE NEGATIVE Final    Comment: (NOTE) SARS-CoV-2 target nucleic acids are NOT DETECTED.  The SARS-CoV-2 RNA is generally detectable in upper respiratory specimens during the acute phase of infection. The lowest concentration of SARS-CoV-2 viral copies this assay can detect is 138 copies/mL. A negative result does not preclude SARS-Cov-2 infection and should not be used as the sole basis for treatment or other patient management decisions. A negative result may occur with  improper specimen collection/handling, submission of specimen other than nasopharyngeal swab, presence of viral mutation(s) within the areas targeted by this assay, and inadequate number of viral copies(<138 copies/mL). A negative result must be combined with clinical observations, patient history, and epidemiological information. The expected result is Negative.  Fact Sheet for Patients:  BloggerCourse.com  Fact Sheet for Healthcare Providers:  SeriousBroker.it  This test is no t yet approved or cleared by the [place]  FDA and  has been authorized for detection and/or diagnosis of SARS-CoV-2 by FDA under an Emergency Use Authorization (EUA). This EUA will remain  in effect (meaning this test can be used) for the duration of the COVID-19 declaration under Section 564(b)(1) of the Act, 21 U.S.C.section 360bbb-3(b)(1), unless the  authorization is terminated  or revoked sooner.       Influenza A by PCR NEGATIVE NEGATIVE Final   Influenza B by PCR NEGATIVE NEGATIVE Final    Comment: (NOTE) The Xpert Xpress SARS-CoV-2/FLU/RSV plus assay is intended as an aid in the diagnosis of influenza from Nasopharyngeal swab specimens and should not be used as a sole basis for treatment. Nasal washings and aspirates are unacceptable for Xpert Xpress SARS-CoV-2/FLU/RSV testing.  Fact Sheet for Patients: BloggerCourse.com  Fact Sheet for Healthcare Providers: SeriousBroker.it  This test is not yet approved or cleared by the [place]  FDA and has  been authorized for detection and/or diagnosis of SARS-CoV-2 by FDA under an Emergency Use Authorization (EUA). This EUA will remain in effect (meaning this test can be used) for the duration of the COVID-19 declaration under Section 564(b)(1) of the Act, 21 U.S.C. section 360bbb-3(b)(1), unless the authorization is terminated or revoked.     Resp Syncytial Virus by PCR NEGATIVE NEGATIVE Final    Comment: (NOTE) Fact Sheet for Patients: BloggerCourse.com  Fact Sheet for Healthcare Providers: SeriousBroker.it  This test is not yet approved or cleared by the [place]  FDA and has been authorized for detection and/or diagnosis of SARS-CoV-2 by FDA under an Emergency Use Authorization (EUA). This EUA will remain in effect (meaning this test can be used) for the duration of the COVID-19 declaration under Section 564(b)(1) of the Act, 21 U.S.C. section 360bbb-3(b)(1), unless the authorization is terminated or revoked.  Performed at [facility], [address] [place], [place] [address]     Coagulation Studies: No results for input(s): "LABPROT", "INR" in the last 72 hours.   Urinalysis: No results for input(s): "COLORURINE", "LABSPEC", "PHURINE",  "GLUCOSEU", "HGBUR", "BILIRUBINUR", "KETONESUR", "PROTEINUR", "UROBILINOGEN", "NITRITE", "LEUKOCYTESUR" in the last 72 hours.  Invalid input(s): "APPERANCEUR"    Imaging: PERIPHERAL VASCULAR CATHETERIZATION Result Date: 07/16/2023 See surgical note for result.     Medications:      allopurinol   100 mg Oral Daily   amLODipine   5 mg Oral BID   apixaban   2.5 mg Oral BID   carvedilol   25 mg Oral BID WC   Chlorhexidine  Gluconate Cloth  6 each Topical Q0600   cholecalciferol   1,000 Units Oral Daily   cloNIDine   0.1 mg Oral BID   cyanocobalamin   1,000 mcg Oral Daily   hydrALAZINE   100 mg Oral TID   Ipratropium-Albuterol   1 puff Inhalation Q6H   isosorbide  dinitrate  40 mg Oral TID   pantoprazole   40 mg Oral Daily   predniSONE   20 mg Oral Q breakfast   rosuvastatin   20 mg Oral Daily   sodium chloride  flush  3 mL Intravenous Q12H   acetaminophen , albuterol , alteplase , bisacodyl , heparin , lidocaine -EPINEPHrine  (PF), ondansetron  (ZOFRAN ) IV, mouth rinse, sodium chloride  flush  Assessment/ Plan:  [name] is a 73 y.o.  male with PMH of HFrEF,CAD, PVD, CVA, COPD, stage IV CKD who is admitted to [facility] on 06/21/2022 for COPD exacerbation (HCC) [J44.1] COPD with acute exacerbation (HCC) [J44.1] Stage 4 chronic kidney disease (HCC) [N18.4] Cardiorenal syndrome [I13.10]  Acute Kidney Injury on chronic kidney disease stage IV 2/2 to acute cardiorenal syndrome: baseline creatinine of 3.54, GFR of 18. 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  Appreciate vascular placing HD temp cath on 4/30.  Receiving third dialysis treatment today, UF goal 0.5 to 1 L as tolerated.  Will hold dialysis through the weekend to monitor for renal recovery.  Will remove HD temp To limit possible infections.  If further dialysis is required, we will seek tunnel catheter next week.  Lab Results  Component Value Date   CREATININE 3.01 (H) 07/18/2023   CREATININE 3.57 (H) 07/17/2023    CREATININE 4.61 (H) 07/16/2023     Intake/Output Summary (Last 24 hours) at 07/18/2023 1209 Last data filed at 07/18/2023 0500 Gross per 24 hour  Intake --  Output 100 ml  Net -100 ml      Acute Respiratory distress 2/2 Acute COPD vs Acute exacerbation of chronic systolic and diastolic congestive heart failure.   -06/18/22 ECHO LV  EF 50%-60%. Repeat ECHO pending results. Chest CT ordered - Room air .   3. Anemia with chronic kidney disease:  HgB 11.7.   Hypertension with chronic kidney disease. Currently on 40 mg isosorbide  dinitrate, metoprolol  25 mg daily, hydralazine  100 mg TID, amlodipine  5 mg, carvedilol  12.5 mg BID, and clonidine   Blood pressure 176/84 during dialysis.  Secondary Hyperparathyroidism  Ca 8.6, Phos, PTH 124 Bone minerals acceptable.  Phosphorus slightly elevated yesterday.  Will continue to monitor.   LOS: 6 Doc Mandala 5/2/202512:09 PM

## 2023-07-18 NOTE — Plan of Care (Signed)
  Problem: Clinical Measurements: Goal: Respiratory complications will improve Outcome: Progressing Goal: Cardiovascular complication will be avoided Outcome: Progressing   Problem: Activity: Goal: Risk for activity intolerance will decrease Outcome: Progressing   Problem: Nutrition: Goal: Adequate nutrition will be maintained Outcome: Progressing   Problem: Coping: Goal: Level of anxiety will decrease Outcome: Progressing   Problem: Elimination: Goal: Will not experience complications related to bowel motility Outcome: Progressing   Problem: Pain Managment: Goal: General experience of comfort will improve and/or be controlled Outcome: Progressing

## 2023-07-18 NOTE — Progress Notes (Signed)
 Hemodialysis Note:  Received patient in bed to unit. Alert and oriented. Informed consent singed and in chart.  Treatment initiated: 0945 Treatment completed: 1250  Access used: Right Femoral catheter Access issues: None  Patient tolerated well. Transported back to room, alert without acute distress. Report given to patient's RN.  Total UF removed: 500 ml Medications given: None  Post HD weight: 80.8 Kg  [facility]

## 2023-07-18 NOTE — Progress Notes (Signed)
 Called into pt's room for c/o bleeding. Pt's right groin noted to be oozing and pt stated that "I noticed it was wet but I thought it would be ok". Direct pressure held and notified CN and Dr Authur Leghorn. Pressure held x15 minutes and pt cleaned up, bed changed and new dressing placed. After turning pt in bed and pt having a series of coughs, pt's dressing noted to be bloody again. Held direct pressure x20 minutes and new pressure dressing in place. Surgicell at bedside. Checked dressing with oncoming night shift Alex A RN and no bleeding noted at this time. Pt educated not to get up and to call if any wetness or bleeding noted. Pt verbalizes understanding. Bed alarm on middle setting and locked and low. Call bell in reach. Pt's possessions and urinal in reach as well. NAD. Pt joking with nurses.

## 2023-07-18 NOTE — Progress Notes (Signed)
 Right femoral HD cath removed per ordered. No active bleeding noted. Floor charge RN put pressure in HD site for 15 minutes, with Vaseline gauze and 4x4.

## 2023-07-18 NOTE — Progress Notes (Signed)
 PROGRESS NOTE    Gary Mccoy.   ZOX:[id number] DOB: Mar 27, 1950  DOA: 07/11/2023 Date of Service: 07/18/23 which is hospital day 6  PCP: Sari Cunning, MD    Hospital course / significant events:   HPI: 73yo with h/o HFrEF, CAD/PAD, carotid stenosis, stage IV CKD, COPD, HLD, and CVA who presented on 4/25 with SOB.    04/25: admitted to hospitalist w/ resp fail requiring BiPap, d/t COPD and HFrEF exacerbations.  04/26: on [place] O2, Nephrology consult for cardiorenal syndrome / advancing CKD. Started lasix  drip. Echo done.  04/27: continue lasix  drip 04/28: lasix  drip d/c  04/29: no immediate dialysis need but monitoring off lasix   04/30: nephro recs for dialysis, which was started today 05/01, 05/02: dialysis      Consultants:  Nephrology  Vascular surgery   Procedures/Surgeries: 07/16/23 R femoral temporary dialysis catheter placed      ASSESSMENT & PLAN:   Acute hypoxic respiratory failure Due to acute on chronic heart failure +/- bronchitis/COPD exacerbation  Required BiPAP on presentation, weaned to [place] O2 O2 support as needed  Treat underlying cause(s)   Cardiorenal syndrome complicated by stage IV CKD question progression to ESRD  Patient's renal dysfunction appears to be stable, but he appears unable to effectively balance CHF associated with volume overload, not improved with Lasix  Nephrology following  Dialysis initiated - 3rd treatment today then will leave off and evaluate renal function to determine need for further dialysis  Monitor BMP   Acute on chronic combined CHF Echo with EF 45-50%, global hypokinesis, moderate to severe LV dilatation, grade 1 DD IV Lasix  --> Lasix  infusion stopped 4/28 --> off diuresis and monitoring renal fxn  Dialysis initiated  Strict I&O    HLD Crestor    HTN Hold amlodipine  was taking carvedilol , clonidine , hydralazine , Toprol  XL --> duplicate beta blocker, d/c metoprolol  and will continue coreg      COPD w/  questionable exacerbation given shortness of breath and cough but SOB worsened despite Levaquin and steroids x 4 days PTA, more likely d/t CHF  Combivent  prednisone  taper Albuterol  q2h prn No current wheezing noted   CAD S/P percutaneous coronary angioplasty No chest pain reported at this time Troponin is elevated, however improved compared to prior Continue Eliquis , no ASA Carvedilol   No ACE/ARB d/t renal function at this time  Statin  Isosorbide     History of CVA and bilateral carotid artery stenosis (cerebrovascular accident) History of CVA with carotid artery stenosis, as well as extensive PAD PCP recently stopped Plavix  Continue Eliquis   Hx gout Allopurinol  renal dose  GERD PPI  No concerns based on BMI: Body mass index is 24.25 kg/m.[name] Significantly low or high BMI is associated with higher medical risk.  Underweight - under 18  overweight - 25 to 29 obese - 30 or more Class 1 obesity: BMI of 30.0 to 34 Class 2 obesity: BMI of 35.0 to 39 Class 3 obesity: BMI of 40.0 to 49 Super Morbid Obesity: BMI 50-59 Super-super Morbid Obesity: BMI 60+ Healthy nutrition and physical activity advised as adjunct to other disease management and risk reduction treatments    DVT prophylaxis: heparin  IV fluids: no continuous IV fluids  Nutrition: cardiac/carb Central lines / other devices: tempcath for HD  Code Status: FULL CODE ACP documentation reviewed: none on file in [facility]  TOC needs: TBD Medical barriers to dispo: dialysis starting. Expected medical readiness for discharge several more days at soonest.  Subjective / Brief ROS:  Patient reports no concerns today Seen following dialysis  Denies CP/SOB.  Pain controlled.  Denies new weakness.    Family Communication: none at bedside     Objective Findings:  Vitals:   07/18/23 1230 07/18/23 1250 07/18/23 1251 07/18/23 1642  BP: (!) 177/81 (!) 185/83 (!) 185/83 (!) 156/73  Pulse: 67 65 67  65  Resp: 16 16 16    Temp:  98.5 F (36.9 C)  98.9 F (37.2 C)  TempSrc:  Oral  Oral  SpO2: 91% 92% 94% 94%  Weight:   80.8 kg   Height:        Intake/Output Summary (Last 24 hours) at 07/18/2023 1817 Last data filed at 07/18/2023 1251 Gross per 24 hour  Intake --  Output 600 ml  Net -600 ml   Filed Weights   07/18/23 0500 07/18/23 0929 07/18/23 1251  Weight: 80.1 kg 81.3 kg 80.8 kg    Examination:  Physical Exam Constitutional:      General: He is not in acute distress. Cardiovascular:     Rate and Rhythm: Normal rate and regular rhythm.  Pulmonary:     Effort: Pulmonary effort is normal. No respiratory distress.  Skin:    General: Skin is warm and dry.  Neurological:     General: No focal deficit present.     Mental Status: He is alert and oriented to person, place, and time. Mental status is at baseline.  Psychiatric:        Behavior: Behavior normal.          Scheduled Medications:   allopurinol   100 mg Oral Daily   amLODipine   5 mg Oral BID   apixaban   2.5 mg Oral BID   carvedilol   25 mg Oral BID WC   Chlorhexidine  Gluconate Cloth  6 each Topical Q0600   cholecalciferol   1,000 Units Oral Daily   cloNIDine   0.1 mg Oral BID   cyanocobalamin   1,000 mcg Oral Daily   hydrALAZINE   100 mg Oral TID   Ipratropium-Albuterol   1 puff Inhalation Q6H   isosorbide  dinitrate  40 mg Oral TID   pantoprazole   40 mg Oral Daily   predniSONE   20 mg Oral Q breakfast   rosuvastatin   20 mg Oral Daily   sodium chloride  flush  3 mL Intravenous Q12H    Continuous Infusions:   PRN Medications:  acetaminophen , albuterol , bisacodyl , lidocaine -EPINEPHrine  (PF), ondansetron  (ZOFRAN ) IV, mouth rinse, sodium chloride  flush  Antimicrobials from admission:  Anti-infectives (From admission, onward)    None           Data Reviewed:  I have personally reviewed the following...  CBC: Recent Labs  Lab 07/12/23 0422 07/13/23 0424 07/14/23 0430 07/15/23 0433  07/17/23 0759 07/18/23 0951  WBC 12.5* 11.4* 8.1 11.5* 11.8* 14.5*  NEUTROABS 11.3* 10.0* 7.3 9.6*  --   --   HGB 12.9* 12.2* 12.9* 12.3* 11.6* 11.7*  HCT 37.6* 34.5* 36.5* 34.6* 32.8* 34.1*  MCV 85.8 84.4 83.0 82.8 85.0 86.5  PLT 192 167 182 175 142* 141*   Basic Metabolic Panel: Recent Labs  Lab 07/12/23 0422 07/13/23 0421 07/13/23 0424 07/14/23 0430 07/15/23 0433 07/16/23 0619 07/17/23 0344 07/18/23 0423  NA 134*  --    < > 132* 129* 130* 134* 131*  K 4.2  --    < > 3.2* 3.3* 3.2* 3.4* 3.3*  CL 98  --    < > 93* 92* 92* 97* 95*  CO2 25  --    < >  24 25 22 24 25   GLUCOSE 129*  --    < > 138* 119* 128* 125* 111*  BUN 97*  --    < > 135* 139* 164* 111* 82*  CREATININE 3.68*  --    < > 3.93* 4.50* 4.61* 3.57* 3.01*  CALCIUM  9.0  --    < > 8.7* 8.6* 8.7* 8.6* 8.5*  MG 2.4  --   --   --   --   --   --   --   PHOS  --  6.6*  --   --   --   --  5.9*  --    < > = values in this interval not displayed.   GFR: Estimated Creatinine Clearance: 25.4 mL/min (A) (by C-G formula based on SCr of 3.01 mg/dL (H)). Liver Function Tests: No results for input(s): "AST", "ALT", "ALKPHOS", "BILITOT", "PROT", "ALBUMIN" in the last 168 hours. No results for input(s): "LIPASE", "AMYLASE" in the last 168 hours. No results for input(s): "AMMONIA" in the last 168 hours. Coagulation Profile: No results for input(s): "INR", "PROTIME" in the last 168 hours.  Cardiac Enzymes: No results for input(s): "CKTOTAL", "CKMB", "CKMBINDEX", "TROPONINI" in the last 168 hours. BNP (last 3 results) No results for input(s): "PROBNP" in the last 8760 hours. HbA1C: No results for input(s): "HGBA1C" in the last 72 hours. CBG: Recent Labs  Lab 07/16/23 0801  GLUCAP 112*   Lipid Profile: No results for input(s): "CHOL", "HDL", "LDLCALC", "TRIG", "CHOLHDL", "LDLDIRECT" in the last 72 hours. Thyroid  Function Tests: No results for input(s): "TSH", "T4TOTAL", "FREET4", "T3FREE", "THYROIDAB" in the last 72  hours. Anemia Panel: No results for input(s): "VITAMINB12", "FOLATE", "FERRITIN", "TIBC", "IRON", "RETICCTPCT" in the last 72 hours. Most Recent Urinalysis On File:     Component Value Date/Time   COLORURINE STRAW (A) 10/29/2021 0030   APPEARANCEUR CLEAR (A) 10/29/2021 0030   LABSPEC 1.005 10/29/2021 0030   PHURINE 5.0 10/29/2021 0030   GLUCOSEU NEGATIVE 10/29/2021 0030   HGBUR NEGATIVE 10/29/2021 0030   BILIRUBINUR NEGATIVE 10/29/2021 0030   KETONESUR NEGATIVE 10/29/2021 0030   PROTEINUR NEGATIVE 10/29/2021 0030   NITRITE NEGATIVE 10/29/2021 0030   LEUKOCYTESUR NEGATIVE 10/29/2021 0030   Sepsis Labs: @LABRCNTIP (procalcitonin:4,lacticidven:4) Microbiology: Recent Results (from the past 240 hours)  Blood culture (routine x 2)     Status: None   Collection Time: 07/11/23 10:45 AM   Specimen: BLOOD RIGHT FOREARM  Result Value Ref Range Status   Specimen Description BLOOD RIGHT FOREARM  Final   Special Requests   Final    BOTTLES DRAWN AEROBIC AND ANAEROBIC Blood Culture results may not be optimal due to an inadequate volume of blood received in culture bottles   Culture   Final    NO GROWTH 5 DAYS Performed at [facility], [address] [place], [place] [address]    Report Status 07/16/2023 FINAL  Final  Blood culture (routine x 2)     Status: None   Collection Time: 07/11/23 10:45 AM   Specimen: BLOOD LEFT FOREARM  Result Value Ref Range Status   Specimen Description BLOOD LEFT FOREARM  Final   Special Requests   Final    BOTTLES DRAWN AEROBIC AND ANAEROBIC Blood Culture adequate volume   Culture   Final    NO GROWTH 5 DAYS Performed at [facility], [address] [place], [place] [address]    Report Status 07/16/2023 FINAL  Final  Resp panel by RT-PCR (RSV, Flu A&B, Covid)  Anterior Nasal Swab     Status: None   Collection Time: 07/11/23  1:53 PM   Specimen: Anterior Nasal Swab  Result Value Ref Range Status   SARS Coronavirus 2 by RT PCR  NEGATIVE NEGATIVE Final    Comment: (NOTE) SARS-CoV-2 target nucleic acids are NOT DETECTED.  The SARS-CoV-2 RNA is generally detectable in upper respiratory specimens during the acute phase of infection. The lowest concentration of SARS-CoV-2 viral copies this assay can detect is 138 copies/mL. A negative result does not preclude SARS-Cov-2 infection and should not be used as the sole basis for treatment or other patient management decisions. A negative result may occur with  improper specimen collection/handling, submission of specimen other than nasopharyngeal swab, presence of viral mutation(s) within the areas targeted by this assay, and inadequate number of viral copies(<138 copies/mL). A negative result must be combined with clinical observations, patient history, and epidemiological information. The expected result is Negative.  Fact Sheet for Patients:  BloggerCourse.com  Fact Sheet for Healthcare Providers:  SeriousBroker.it  This test is no t yet approved or cleared by the [place]  FDA and  has been authorized for detection and/or diagnosis of SARS-CoV-2 by FDA under an Emergency Use Authorization (EUA). This EUA will remain  in effect (meaning this test can be used) for the duration of the COVID-19 declaration under Section 564(b)(1) of the Act, 21 U.S.C.section 360bbb-3(b)(1), unless the authorization is terminated  or revoked sooner.       Influenza A by PCR NEGATIVE NEGATIVE Final   Influenza B by PCR NEGATIVE NEGATIVE Final    Comment: (NOTE) The Xpert Xpress SARS-CoV-2/FLU/RSV plus assay is intended as an aid in the diagnosis of influenza from Nasopharyngeal swab specimens and should not be used as a sole basis for treatment. Nasal washings and aspirates are unacceptable for Xpert Xpress SARS-CoV-2/FLU/RSV testing.  Fact Sheet for Patients: BloggerCourse.com  Fact Sheet for  Healthcare Providers: SeriousBroker.it  This test is not yet approved or cleared by the [place]  FDA and has been authorized for detection and/or diagnosis of SARS-CoV-2 by FDA under an Emergency Use Authorization (EUA). This EUA will remain in effect (meaning this test can be used) for the duration of the COVID-19 declaration under Section 564(b)(1) of the Act, 21 U.S.C. section 360bbb-3(b)(1), unless the authorization is terminated or revoked.     Resp Syncytial Virus by PCR NEGATIVE NEGATIVE Final    Comment: (NOTE) Fact Sheet for Patients: BloggerCourse.com  Fact Sheet for Healthcare Providers: SeriousBroker.it  This test is not yet approved or cleared by the [place]  FDA and has been authorized for detection and/or diagnosis of SARS-CoV-2 by FDA under an Emergency Use Authorization (EUA). This EUA will remain in effect (meaning this test can be used) for the duration of the COVID-19 declaration under Section 564(b)(1) of the Act, 21 U.S.C. section 360bbb-3(b)(1), unless the authorization is terminated or revoked.  Performed at [facility], [address] [place], [place] [address]       Radiology Studies last 3 days: PERIPHERAL VASCULAR CATHETERIZATION Result Date: 07/16/2023 See surgical note for result.        Jashun Puertas, DO [facility] 07/18/2023, 6:17 PM    Dictation software may have been used to generate the above note. Typos may occur and escape review in typed/dictated notes. Please contact Dr Authur Leghorn directly for clarity if needed.  Staff may message me via secure chat in Epic  but this may not receive an immediate response,  please  page me for urgent matters!  If 7PM-7AM, please contact night coverage [email or web address]

## 2023-07-18 NOTE — Progress Notes (Signed)
 PT Cancellation Note  Patient Details Name: Gary Mccoy. MRN: [id number] DOB: 03-31-1950   Cancelled Treatment:    Reason Eval/Treat Not Completed: Patient at procedure or test/unavailable (Pt OTF for HD. WIll continue to follow and resume services once pt is available.)  12:53 PM, 07/18/23 Dawn Eth, PT, DPT Physical Therapist - [facility]  [phone] (ASCOM)    Kseniya Grunden C 07/18/2023, 12:53 PM

## 2023-07-19 DIAGNOSIS — J9621 Acute and chronic respiratory failure with hypoxia: Secondary | ICD-10-CM | POA: Diagnosis not present

## 2023-07-19 LAB — BASIC METABOLIC PANEL WITH GFR
Anion gap: 12 (ref 5–15)
BUN: 57 mg/dL — ABNORMAL HIGH (ref 8–23)
CO2: 25 mmol/L (ref 22–32)
Calcium: 8.3 mg/dL — ABNORMAL LOW (ref 8.9–10.3)
Chloride: 95 mmol/L — ABNORMAL LOW (ref 98–111)
Creatinine, Ser: 2.64 mg/dL — ABNORMAL HIGH (ref 0.61–1.24)
GFR, Estimated: 25 mL/min — ABNORMAL LOW (ref >60)
Glucose, Bld: 140 mg/dL — ABNORMAL HIGH (ref 70–99)
Potassium: 3.5 mmol/L (ref 3.5–5.1)
Sodium: 132 mmol/L — ABNORMAL LOW (ref 135–145)

## 2023-07-19 MED ORDER — LIDOCAINE 5 % EX PTCH
1.0000 | MEDICATED_PATCH | Freq: Every day | CUTANEOUS | Status: DC | PRN
  Filled 2023-07-19: qty 2

## 2023-07-19 NOTE — Progress Notes (Signed)
 PROGRESS NOTE    Gary Mccoy.   ZOX:[id number] DOB: February 20, 1951  DOA: 07/11/2023 Date of Service: 07/19/23 which is hospital day 7  PCP: Sari Cunning, MD    Hospital course / significant events:   HPI: 73yo with h/o HFrEF, CAD/PAD, carotid stenosis, stage IV CKD, COPD, HLD, and CVA who presented on 4/25 with SOB.    04/25: admitted to hospitalist w/ resp fail requiring BiPap, d/t COPD and HFrEF exacerbations.  04/26: on [place] O2, Nephrology consult for cardiorenal syndrome / advancing CKD. Started lasix  drip. Echo done.  04/27: continue lasix  drip 04/28: lasix  drip d/c  04/29: no immediate dialysis need but monitoring off lasix   04/30: nephro recs for dialysis, which was started today 05/01, 05/02: dialysis  05/03: stable, cr improved will continue to follow BMP to determine if further dialysis needed      Consultants:  Nephrology  Vascular surgery   Procedures/Surgeries: 07/16/23 R femoral temporary dialysis catheter placed      ASSESSMENT & PLAN:   Acute hypoxic respiratory failure Due to acute on chronic heart failure +/- bronchitis/COPD exacerbation  Required BiPAP on presentation, weaned to [place] O2 O2 support as needed  Treat underlying cause(s)   Cardiorenal syndrome complicated by stage IV CKD question progression to ESRD  Patient's renal dysfunction appears to be stable, but he appears unable to effectively balance CHF associated with volume overload, not improved with Lasix  Nephrology following  Dialysis initiated - 3rd treatment today then will leave off and evaluate renal function to determine need for further dialysis  Monitor BMP   Acute on chronic combined CHF Echo with EF 45-50%, global hypokinesis, moderate to severe LV dilatation, grade 1 DD IV Lasix  --> Lasix  infusion stopped 4/28 --> off diuresis and monitoring renal fxn  Dialysis initiated  Strict I&O    HLD Crestor    HTN Hold amlodipine  was taking carvedilol , clonidine ,  hydralazine , Toprol  XL --> duplicate beta blocker, d/c metoprolol  and will continue coreg      COPD w/ questionable exacerbation given shortness of breath and cough but SOB worsened despite Levaquin and steroids x 4 days PTA, more likely d/t CHF  Combivent  prednisone  taper Albuterol  q2h prn No current wheezing noted   CAD S/P percutaneous coronary angioplasty No chest pain reported at this time Troponin is elevated, however improved compared to prior Continue Eliquis , no ASA Carvedilol   No ACE/ARB d/t renal function at this time  Statin  Isosorbide     History of CVA and bilateral carotid artery stenosis (cerebrovascular accident) History of CVA with carotid artery stenosis, as well as extensive PAD PCP recently stopped Plavix  Continue Eliquis   Hx gout Allopurinol  renal dose  GERD PPI  No concerns based on BMI: Body mass index is 24.25 kg/m.[name] Significantly low or high BMI is associated with higher medical risk.  Underweight - under 18  overweight - 25 to 29 obese - 30 or more Class 1 obesity: BMI of 30.0 to 34 Class 2 obesity: BMI of 35.0 to 39 Class 3 obesity: BMI of 40.0 to 49 Super Morbid Obesity: BMI 50-59 Super-super Morbid Obesity: BMI 60+ Healthy nutrition and physical activity advised as adjunct to other disease management and risk reduction treatments    DVT prophylaxis: heparin  IV fluids: no continuous IV fluids  Nutrition: cardiac/carb Central lines / other devices: tempcath for HD  Code Status: FULL CODE ACP documentation reviewed: none on file in [facility]  TOC needs: TBD Medical barriers to dispo: dialysis starting. Expected medical readiness for discharge  several more days at soonest.              Subjective / Brief ROS:  Patient reports no concerns today other than mild lower back pain which he attributes to "not moving around much"  Seen following dialysis  Denies CP/SOB.  Pain controlled.  Denies new weakness.    Family  Communication: family at bedside     Objective Findings:  Vitals:   07/19/23 0831 07/19/23 0956 07/19/23 1000 07/19/23 1722  BP: (!) 158/76 (!) 158/76 (!) 158/76 (!) 152/66  Pulse: 64 64  67  Resp: 16     Temp: 97.8 F (36.6 C)   97.6 F (36.4 C)  TempSrc: Oral   Oral  SpO2: 96%   98%  Weight:      Height:        Intake/Output Summary (Last 24 hours) at 07/19/2023 1755 Last data filed at 07/19/2023 1712 Gross per 24 hour  Intake 837.88 ml  Output 500 ml  Net 337.88 ml   Filed Weights   07/18/23 0929 07/18/23 1251 07/19/23 0459  Weight: 81.3 kg 80.8 kg 81.6 kg    Examination:  Physical Exam Constitutional:      General: He is not in acute distress. Cardiovascular:     Rate and Rhythm: Normal rate and regular rhythm.  Pulmonary:     Effort: Pulmonary effort is normal. No respiratory distress.  Skin:    General: Skin is warm and dry.  Neurological:     General: No focal deficit present.     Mental Status: He is alert and oriented to person, place, and time. Mental status is at baseline.  Psychiatric:        Behavior: Behavior normal.          Scheduled Medications:   allopurinol   100 mg Oral Daily   amLODipine   5 mg Oral BID   apixaban   2.5 mg Oral BID   carvedilol   25 mg Oral BID WC   Chlorhexidine  Gluconate Cloth  6 each Topical Q0600   cholecalciferol   1,000 Units Oral Daily   cloNIDine   0.1 mg Oral BID   cyanocobalamin   1,000 mcg Oral Daily   hydrALAZINE   100 mg Oral TID   Ipratropium-Albuterol   1 puff Inhalation Q6H   isosorbide  dinitrate  40 mg Oral TID   pantoprazole   40 mg Oral Daily   predniSONE   20 mg Oral Q breakfast   rosuvastatin   20 mg Oral Daily   sodium chloride  flush  3 mL Intravenous Q12H    Continuous Infusions:   PRN Medications:  acetaminophen , albuterol , bisacodyl , lidocaine , lidocaine -EPINEPHrine  (PF), ondansetron  (ZOFRAN ) IV, mouth rinse, sodium chloride  flush  Antimicrobials from admission:  Anti-infectives (From  admission, onward)    None           Data Reviewed:  I have personally reviewed the following...  CBC: Recent Labs  Lab 07/13/23 0424 07/14/23 0430 07/15/23 0433 07/17/23 0759 07/18/23 0951  WBC 11.4* 8.1 11.5* 11.8* 14.5*  NEUTROABS 10.0* 7.3 9.6*  --   --   HGB 12.2* 12.9* 12.3* 11.6* 11.7*  HCT 34.5* 36.5* 34.6* 32.8* 34.1*  MCV 84.4 83.0 82.8 85.0 86.5  PLT 167 182 175 142* 141*   Basic Metabolic Panel: Recent Labs  Lab 07/13/23 0421 07/13/23 0424 07/15/23 0433 07/16/23 0619 07/17/23 0344 07/18/23 0423 07/19/23 0438  NA  --    < > 129* 130* 134* 131* 132*  K  --    < > 3.3*  3.2* 3.4* 3.3* 3.5  CL  --    < > 92* 92* 97* 95* 95*  CO2  --    < > 25 22 24 25 25   GLUCOSE  --    < > 119* 128* 125* 111* 140*  BUN  --    < > 139* 164* 111* 82* 57*  CREATININE  --    < > 4.50* 4.61* 3.57* 3.01* 2.64*  CALCIUM   --    < > 8.6* 8.7* 8.6* 8.5* 8.3*  PHOS 6.6*  --   --   --  5.9*  --   --    < > = values in this interval not displayed.   GFR: Estimated Creatinine Clearance: 29 mL/min (A) (by C-G formula based on SCr of 2.64 mg/dL (H)). Liver Function Tests: No results for input(s): "AST", "ALT", "ALKPHOS", "BILITOT", "PROT", "ALBUMIN" in the last 168 hours. No results for input(s): "LIPASE", "AMYLASE" in the last 168 hours. No results for input(s): "AMMONIA" in the last 168 hours. Coagulation Profile: No results for input(s): "INR", "PROTIME" in the last 168 hours.  Cardiac Enzymes: No results for input(s): "CKTOTAL", "CKMB", "CKMBINDEX", "TROPONINI" in the last 168 hours. BNP (last 3 results) No results for input(s): "PROBNP" in the last 8760 hours. HbA1C: No results for input(s): "HGBA1C" in the last 72 hours. CBG: Recent Labs  Lab 07/16/23 0801  GLUCAP 112*   Lipid Profile: No results for input(s): "CHOL", "HDL", "LDLCALC", "TRIG", "CHOLHDL", "LDLDIRECT" in the last 72 hours. Thyroid  Function Tests: No results for input(s): "TSH", "T4TOTAL",  "FREET4", "T3FREE", "THYROIDAB" in the last 72 hours. Anemia Panel: No results for input(s): "VITAMINB12", "FOLATE", "FERRITIN", "TIBC", "IRON", "RETICCTPCT" in the last 72 hours. Most Recent Urinalysis On File:     Component Value Date/Time   COLORURINE STRAW (A) 10/29/2021 0030   APPEARANCEUR CLEAR (A) 10/29/2021 0030   LABSPEC 1.005 10/29/2021 0030   PHURINE 5.0 10/29/2021 0030   GLUCOSEU NEGATIVE 10/29/2021 0030   HGBUR NEGATIVE 10/29/2021 0030   BILIRUBINUR NEGATIVE 10/29/2021 0030   KETONESUR NEGATIVE 10/29/2021 0030   PROTEINUR NEGATIVE 10/29/2021 0030   NITRITE NEGATIVE 10/29/2021 0030   LEUKOCYTESUR NEGATIVE 10/29/2021 0030   Sepsis Labs: @LABRCNTIP (procalcitonin:4,lacticidven:4) Microbiology: Recent Results (from the past 240 hours)  Blood culture (routine x 2)     Status: None   Collection Time: 07/11/23 10:45 AM   Specimen: BLOOD RIGHT FOREARM  Result Value Ref Range Status   Specimen Description BLOOD RIGHT FOREARM  Final   Special Requests   Final    BOTTLES DRAWN AEROBIC AND ANAEROBIC Blood Culture results may not be optimal due to an inadequate volume of blood received in culture bottles   Culture   Final    NO GROWTH 5 DAYS Performed at [facility], [address], [place], [place] [address]    Report Status 07/16/2023 FINAL  Final  Blood culture (routine x 2)     Status: None   Collection Time: 07/11/23 10:45 AM   Specimen: BLOOD LEFT FOREARM  Result Value Ref Range Status   Specimen Description BLOOD LEFT FOREARM  Final   Special Requests   Final    BOTTLES DRAWN AEROBIC AND ANAEROBIC Blood Culture adequate volume   Culture   Final    NO GROWTH 5 DAYS Performed at [facility], [address] [place], [place] [address]    Report Status 07/16/2023 FINAL  Final  Resp panel by RT-PCR (RSV, Flu A&B, Covid) Anterior Nasal Swab  Status: None   Collection Time: 07/11/23  1:53 PM   Specimen: Anterior Nasal Swab  Result Value Ref  Range Status   SARS Coronavirus 2 by RT PCR NEGATIVE NEGATIVE Final    Comment: (NOTE) SARS-CoV-2 target nucleic acids are NOT DETECTED.  The SARS-CoV-2 RNA is generally detectable in upper respiratory specimens during the acute phase of infection. The lowest concentration of SARS-CoV-2 viral copies this assay can detect is 138 copies/mL. A negative result does not preclude SARS-Cov-2 infection and should not be used as the sole basis for treatment or other patient management decisions. A negative result may occur with  improper specimen collection/handling, submission of specimen other than nasopharyngeal swab, presence of viral mutation(s) within the areas targeted by this assay, and inadequate number of viral copies(<138 copies/mL). A negative result must be combined with clinical observations, patient history, and epidemiological information. The expected result is Negative.  Fact Sheet for Patients:  BloggerCourse.com  Fact Sheet for Healthcare Providers:  SeriousBroker.it  This test is no t yet approved or cleared by the [place]  FDA and  has been authorized for detection and/or diagnosis of SARS-CoV-2 by FDA under an Emergency Use Authorization (EUA). This EUA will remain  in effect (meaning this test can be used) for the duration of the COVID-19 declaration under Section 564(b)(1) of the Act, 21 U.S.C.section 360bbb-3(b)(1), unless the authorization is terminated  or revoked sooner.       Influenza A by PCR NEGATIVE NEGATIVE Final   Influenza B by PCR NEGATIVE NEGATIVE Final    Comment: (NOTE) The Xpert Xpress SARS-CoV-2/FLU/RSV plus assay is intended as an aid in the diagnosis of influenza from Nasopharyngeal swab specimens and should not be used as a sole basis for treatment. Nasal washings and aspirates are unacceptable for Xpert Xpress SARS-CoV-2/FLU/RSV testing.  Fact Sheet for  Patients: BloggerCourse.com  Fact Sheet for Healthcare Providers: SeriousBroker.it  This test is not yet approved or cleared by the [place]  FDA and has been authorized for detection and/or diagnosis of SARS-CoV-2 by FDA under an Emergency Use Authorization (EUA). This EUA will remain in effect (meaning this test can be used) for the duration of the COVID-19 declaration under Section 564(b)(1) of the Act, 21 U.S.C. section 360bbb-3(b)(1), unless the authorization is terminated or revoked.     Resp Syncytial Virus by PCR NEGATIVE NEGATIVE Final    Comment: (NOTE) Fact Sheet for Patients: BloggerCourse.com  Fact Sheet for Healthcare Providers: SeriousBroker.it  This test is not yet approved or cleared by the [place]  FDA and has been authorized for detection and/or diagnosis of SARS-CoV-2 by FDA under an Emergency Use Authorization (EUA). This EUA will remain in effect (meaning this test can be used) for the duration of the COVID-19 declaration under Section 564(b)(1) of the Act, 21 U.S.C. section 360bbb-3(b)(1), unless the authorization is terminated or revoked.  Performed at [facility], [address] [place], [place] [address]       Radiology Studies last 3 days: PERIPHERAL VASCULAR CATHETERIZATION Result Date: 07/16/2023 See surgical note for result.        Green Quincy, DO [facility] 07/19/2023, 5:55 PM    Dictation software may have been used to generate the above note. Typos may occur and escape review in typed/dictated notes. Please contact Dr Authur Leghorn directly for clarity if needed.  Staff may message me via secure chat in Epic  but this may not receive an immediate response,  please page me for urgent matters!  If  7PM-7AM, please contact night coverage [email or web address]

## 2023-07-19 NOTE — Progress Notes (Signed)
 [facility]  ROUNDING NOTE   Subjective:  [name] was admitted to [facility] on 06/21/2023 for shortness of breath with suspect of CHF vs COPD exacerbation. Patient reports increase weight gain of 13lbs and states his concern for repeat of previous CHF exacerbation.   Patient is followed by Dr Zelda Hickman outpatient.   Update:  Patient sitting up in bed Eating breakfast Denies pain Continues to complain of food distaste    Objective:  Vital signs in last 24 hours:  Temp:  [97.7 F (36.5 C)-98.9 F (37.2 C)] 97.8 F (36.6 C) (05/03 0831) Pulse Rate:  [64-71] 64 (05/03 0956) Resp:  [16-20] 16 (05/03 0831) BP: (143-185)/(71-83) 158/76 (05/03 1000) SpO2:  [87 %-96 %] 96 % (05/03 0831) Weight:  [80.8 kg-81.6 kg] 81.6 kg (05/03 0459)  Weight change: -0.1 kg Filed Weights   07/18/23 0929 07/18/23 1251 07/19/23 0459  Weight: 81.3 kg 80.8 kg 81.6 kg    Intake/Output: I/O last 3 completed shifts: In: 150 [P.O.:150] Out: 900 [Urine:400; Other:500]   Intake/Output this shift:  Total I/O In: 240 [P.O.:240] Out: -   Physical Exam: General: NAD  Head: Normocephalic, atraumatic. Moist oral mucosal membranes  Eyes: Anicteric  Lungs:  2L 02, clear to auscultation  Heart: Regular rate and rhythm  Abdomen:  Soft, nontender,   Extremities:  No peripheral edema.  Neurologic: Alert, moving all four extremities  Skin: No lesions  Access: None    Basic Metabolic Panel: Recent Labs  Lab 07/13/23 0421 07/13/23 0424 07/15/23 0433 07/16/23 0865 07/17/23 0344 07/18/23 0423 07/19/23 0438  NA  --    < > 129* 130* 134* 131* 132*  K  --    < > 3.3* 3.2* 3.4* 3.3* 3.5  CL  --    < > 92* 92* 97* 95* 95*  CO2  --    < > 25 22 24 25 25   GLUCOSE  --    < > 119* 128* 125* 111* 140*  BUN  --    < > 139* 164* 111* 82* 57*  CREATININE  --    < > 4.50* 4.61* 3.57* 3.01* 2.64*  CALCIUM   --    < > 8.6* 8.7* 8.6* 8.5* 8.3*  PHOS 6.6*  --   --   --  5.9*  --   --    < >  = values in this interval not displayed.    Liver Function Tests: No results for input(s): "AST", "ALT", "ALKPHOS", "BILITOT", "PROT", "ALBUMIN" in the last 168 hours. No results for input(s): "LIPASE", "AMYLASE" in the last 168 hours. No results for input(s): "AMMONIA" in the last 168 hours.  CBC: Recent Labs  Lab 07/13/23 0424 07/14/23 0430 07/15/23 0433 07/17/23 0759 07/18/23 0951  WBC 11.4* 8.1 11.5* 11.8* 14.5*  NEUTROABS 10.0* 7.3 9.6*  --   --   HGB 12.2* 12.9* 12.3* 11.6* 11.7*  HCT 34.5* 36.5* 34.6* 32.8* 34.1*  MCV 84.4 83.0 82.8 85.0 86.5  PLT 167 182 175 142* 141*    Cardiac Enzymes: No results for input(s): "CKTOTAL", "CKMB", "CKMBINDEX", "TROPONINI" in the last 168 hours.  BNP: Invalid input(s): "POCBNP"  CBG: Recent Labs  Lab 07/16/23 0801  GLUCAP 112*    Microbiology: Results for orders placed or performed during the hospital encounter of 07/11/23  Blood culture (routine x 2)     Status: None   Collection Time: 07/11/23 10:45 AM   Specimen: BLOOD RIGHT FOREARM  Result Value Ref Range  Status   Specimen Description BLOOD RIGHT FOREARM  Final   Special Requests   Final    BOTTLES DRAWN AEROBIC AND ANAEROBIC Blood Culture results may not be optimal due to an inadequate volume of blood received in culture bottles   Culture   Final    NO GROWTH 5 DAYS Performed at [facility], [address], [place], [place] [address]    Report Status 07/16/2023 FINAL  Final  Blood culture (routine x 2)     Status: None   Collection Time: 07/11/23 10:45 AM   Specimen: BLOOD LEFT FOREARM  Result Value Ref Range Status   Specimen Description BLOOD LEFT FOREARM  Final   Special Requests   Final    BOTTLES DRAWN AEROBIC AND ANAEROBIC Blood Culture adequate volume   Culture   Final    NO GROWTH 5 DAYS Performed at [facility], [address] [place], [place] [address]    Report Status 07/16/2023 FINAL  Final  Resp panel by RT-PCR (RSV, Flu  A&B, Covid) Anterior Nasal Swab     Status: None   Collection Time: 07/11/23  1:53 PM   Specimen: Anterior Nasal Swab  Result Value Ref Range Status   SARS Coronavirus 2 by RT PCR NEGATIVE NEGATIVE Final    Comment: (NOTE) SARS-CoV-2 target nucleic acids are NOT DETECTED.  The SARS-CoV-2 RNA is generally detectable in upper respiratory specimens during the acute phase of infection. The lowest concentration of SARS-CoV-2 viral copies this assay can detect is 138 copies/mL. A negative result does not preclude SARS-Cov-2 infection and should not be used as the sole basis for treatment or other patient management decisions. A negative result may occur with  improper specimen collection/handling, submission of specimen other than nasopharyngeal swab, presence of viral mutation(s) within the areas targeted by this assay, and inadequate number of viral copies(<138 copies/mL). A negative result must be combined with clinical observations, patient history, and epidemiological information. The expected result is Negative.  Fact Sheet for Patients:  BloggerCourse.com  Fact Sheet for Healthcare Providers:  SeriousBroker.it  This test is no t yet approved or cleared by the [place]  FDA and  has been authorized for detection and/or diagnosis of SARS-CoV-2 by FDA under an Emergency Use Authorization (EUA). This EUA will remain  in effect (meaning this test can be used) for the duration of the COVID-19 declaration under Section 564(b)(1) of the Act, 21 U.S.C.section 360bbb-3(b)(1), unless the authorization is terminated  or revoked sooner.       Influenza A by PCR NEGATIVE NEGATIVE Final   Influenza B by PCR NEGATIVE NEGATIVE Final    Comment: (NOTE) The Xpert Xpress SARS-CoV-2/FLU/RSV plus assay is intended as an aid in the diagnosis of influenza from Nasopharyngeal swab specimens and should not be used as a sole basis for treatment.  Nasal washings and aspirates are unacceptable for Xpert Xpress SARS-CoV-2/FLU/RSV testing.  Fact Sheet for Patients: BloggerCourse.com  Fact Sheet for Healthcare Providers: SeriousBroker.it  This test is not yet approved or cleared by the [place]  FDA and has been authorized for detection and/or diagnosis of SARS-CoV-2 by FDA under an Emergency Use Authorization (EUA). This EUA will remain in effect (meaning this test can be used) for the duration of the COVID-19 declaration under Section 564(b)(1) of the Act, 21 U.S.C. section 360bbb-3(b)(1), unless the authorization is terminated or revoked.     Resp Syncytial Virus by PCR NEGATIVE NEGATIVE Final    Comment: (NOTE) Fact Sheet for Patients:  BloggerCourse.com  Fact Sheet for Healthcare Providers: SeriousBroker.it  This test is not yet approved or cleared by the [place]  FDA and has been authorized for detection and/or diagnosis of SARS-CoV-2 by FDA under an Emergency Use Authorization (EUA). This EUA will remain in effect (meaning this test can be used) for the duration of the COVID-19 declaration under Section 564(b)(1) of the Act, 21 U.S.C. section 360bbb-3(b)(1), unless the authorization is terminated or revoked.  Performed at [facility], [address] [place], [place] [address]     Coagulation Studies: No results for input(s): "LABPROT", "INR" in the last 72 hours.   Urinalysis: No results for input(s): "COLORURINE", "LABSPEC", "PHURINE", "GLUCOSEU", "HGBUR", "BILIRUBINUR", "KETONESUR", "PROTEINUR", "UROBILINOGEN", "NITRITE", "LEUKOCYTESUR" in the last 72 hours.  Invalid input(s): "APPERANCEUR"    Imaging: No results found.     Medications:      allopurinol   100 mg Oral Daily   amLODipine   5 mg Oral BID   apixaban   2.5 mg Oral BID   carvedilol   25 mg Oral BID WC   Chlorhexidine   Gluconate Cloth  6 each Topical Q0600   cholecalciferol   1,000 Units Oral Daily   cloNIDine   0.1 mg Oral BID   cyanocobalamin   1,000 mcg Oral Daily   hydrALAZINE   100 mg Oral TID   Ipratropium-Albuterol   1 puff Inhalation Q6H   isosorbide  dinitrate  40 mg Oral TID   pantoprazole   40 mg Oral Daily   predniSONE   20 mg Oral Q breakfast   rosuvastatin   20 mg Oral Daily   sodium chloride  flush  3 mL Intravenous Q12H   acetaminophen , albuterol , bisacodyl , lidocaine -EPINEPHrine  (PF), ondansetron  (ZOFRAN ) IV, mouth rinse, sodium chloride  flush  Assessment/ Plan:  [name] is a 73 y.o.  male with PMH of HFrEF,CAD, PVD, CVA, COPD, stage IV CKD who is admitted to [facility] on 06/21/2022 for COPD exacerbation (HCC) [J44.1] COPD with acute exacerbation (HCC) [J44.1] Stage 4 chronic kidney disease (HCC) [N18.4] Cardiorenal syndrome [I13.10]  Acute Kidney Injury on chronic kidney disease stage IV 2/2 to acute cardiorenal syndrome: baseline creatinine of 3.54, GFR of 18. 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  Appreciate vascular placing HD temp cath on 4/30.  Received dialysis yesterda, UF achieved.  Appreciate nursing staff removing HD temp cath.  Will monitor renal function and urine output during the weekend.  If further dialysis is required, will request tunneled access from vascular team.  Patient made aware of this plan and agreeable.  Lab Results  Component Value Date   CREATININE 2.64 (H) 07/19/2023   CREATININE 3.01 (H) 07/18/2023   CREATININE 3.57 (H) 07/17/2023     Intake/Output Summary (Last 24 hours) at 07/19/2023 1222 Last data filed at 07/19/2023 1034 Gross per 24 hour  Intake 390 ml  Output 800 ml  Net -410 ml      Acute Respiratory distress 2/2 Acute COPD vs Acute exacerbation of chronic systolic and diastolic congestive heart failure.   -06/18/22 ECHO LV EF 50%-60%. Repeat ECHO pending results. Chest CT ordered - Room air .   3. Anemia with  chronic kidney disease:  HgB 11.7.   Hypertension with chronic kidney disease. Currently on 40 mg isosorbide  dinitrate, metoprolol  25 mg daily, hydralazine  100 mg TID, amlodipine  5 mg, carvedilol  12.5 mg BID, and clonidine   Blood pressure 158/76  Secondary Hyperparathyroidism  Ca 8.6, Phos, PTH 124 Will continue to monitor bone minerals.  Phosphorus slightly elevated, patient has cup of Coke  sitting on bedside table.   LOS: 7 Gary Mccoy 5/3/202512:22 PM

## 2023-07-19 NOTE — Plan of Care (Signed)
  Problem: Education: Goal: Knowledge of General Education information will improve Description: Including pain rating scale, medication(s)/side effects and non-pharmacologic comfort measures Outcome: Progressing   Problem: Health Behavior/Discharge Planning: Goal: Ability to manage health-related needs will improve Outcome: Progressing   Problem: Clinical Measurements: Goal: Ability to maintain clinical measurements within normal limits will improve Outcome: Progressing Goal: Diagnostic test results will improve Outcome: Progressing   Problem: Coping: Goal: Level of anxiety will decrease Outcome: Progressing   Problem: Elimination: Goal: Will not experience complications related to bowel motility Outcome: Progressing   Problem: Safety: Goal: Ability to remain free from injury will improve Outcome: Progressing

## 2023-07-20 DIAGNOSIS — J9621 Acute and chronic respiratory failure with hypoxia: Secondary | ICD-10-CM | POA: Diagnosis not present

## 2023-07-20 LAB — BASIC METABOLIC PANEL WITH GFR
Anion gap: 13 (ref 5–15)
BUN: 73 mg/dL — ABNORMAL HIGH (ref 8–23)
CO2: 25 mmol/L (ref 22–32)
Calcium: 8.6 mg/dL — ABNORMAL LOW (ref 8.9–10.3)
Chloride: 95 mmol/L — ABNORMAL LOW (ref 98–111)
Creatinine, Ser: 2.95 mg/dL — ABNORMAL HIGH (ref 0.61–1.24)
GFR, Estimated: 22 mL/min — ABNORMAL LOW (ref >60)
Glucose, Bld: 110 mg/dL — ABNORMAL HIGH (ref 70–99)
Potassium: 3.4 mmol/L — ABNORMAL LOW (ref 3.5–5.1)
Sodium: 133 mmol/L — ABNORMAL LOW (ref 135–145)

## 2023-07-20 LAB — PHOSPHORUS: Phosphorus: 3.9 mg/dL (ref 2.5–4.6)

## 2023-07-20 NOTE — Plan of Care (Signed)
  Problem: Clinical Measurements: Goal: Will remain free from infection Outcome: Progressing Goal: Diagnostic test results will improve Outcome: Progressing Goal: Respiratory complications will improve Outcome: Progressing   Problem: Activity: Goal: Risk for activity intolerance will decrease Outcome: Progressing   Problem: Nutrition: Goal: Adequate nutrition will be maintained Outcome: Progressing   Problem: Elimination: Goal: Will not experience complications related to bowel motility Outcome: Progressing Goal: Will not experience complications related to urinary retention Outcome: Progressing   Problem: Safety: Goal: Ability to remain free from injury will improve Outcome: Progressing   Problem: Skin Integrity: Goal: Risk for impaired skin integrity will decrease Outcome: Progressing   Problem: Education: Goal: Knowledge of disease or condition will improve Outcome: Progressing   Problem: Activity: Goal: Ability to tolerate increased activity will improve Outcome: Progressing   Problem: Respiratory: Goal: Ability to maintain a clear airway will improve Outcome: Progressing Goal: Levels of oxygenation will improve Outcome: Progressing Goal: Ability to maintain adequate ventilation will improve Outcome: Progressing

## 2023-07-20 NOTE — Plan of Care (Signed)

## 2023-07-20 NOTE — Progress Notes (Signed)
 PROGRESS NOTE    Gary Mccoy.   WJX:[id number] DOB: 06/16/1950  DOA: 07/11/2023 Date of Service: 07/20/23 which is hospital day 8  PCP: Sari Cunning, MD    Hospital course / significant events:   HPI: 73yo with h/o HFrEF, CAD/PAD, carotid stenosis, stage IV CKD, COPD, HLD, and CVA who presented on 4/25 with SOB.    04/25: admitted to hospitalist w/ resp fail requiring BiPap, d/t COPD and HFrEF exacerbations.  04/26: on [place] O2, Nephrology consult for cardiorenal syndrome / advancing CKD. Started lasix  drip. Echo done.  04/27: continue lasix  drip 04/28: lasix  drip d/c  04/29: no immediate dialysis need but monitoring off lasix   04/30: nephro recs for dialysis, which was started today 05/01, 05/02: dialysis  05/03: stable, cr improved will continue to follow BMP to determine if further dialysis needed  05/04: Cr up a bit, concern may need long term dialysis / permcath placement      Consultants:  Nephrology  Vascular surgery   Procedures/Surgeries: 07/16/23 R femoral temporary dialysis catheter placed      ASSESSMENT & PLAN:   Acute hypoxic respiratory failure Due to acute on chronic heart failure +/- bronchitis/COPD exacerbation  Required BiPAP on presentation, weaned to [place] O2 O2 support as needed  Treat underlying cause(s)   Cardiorenal syndrome complicated by stage IV CKD question progression to ESRD  Patient's renal dysfunction appears to be stable, but he appears unable to effectively balance CHF associated with volume overload, not improved with Lasix  Nephrology following  Dialysis initiated - 3rd treatment done Friday, monitoring thru the weekend Monitor BMP  concern may need long term dialysis / permcath placement   Acute on chronic combined CHF Echo with EF 45-50%, global hypokinesis, moderate to severe LV dilatation, grade 1 DD IV Lasix  --> Lasix  infusion stopped 4/28 --> off diuresis and monitoring renal fxn  Dialysis initiated  Strict I&O     HLD Crestor    HTN Hold amlodipine  was taking carvedilol , clonidine , hydralazine     COPD w/ questionable exacerbation given shortness of breath and cough but SOB worsened despite Levaquin and steroids x 4 days PTA, more likely d/t CHF  Combivent  prednisone  taper Albuterol  q2h prn No current wheezing noted   CAD S/P percutaneous coronary angioplasty No chest pain reported at this time Troponin is elevated, however improved compared to prior Continue Eliquis , no ASA Carvedilol   No ACE/ARB d/t renal function at this time  Statin  Isosorbide     History of CVA and bilateral carotid artery stenosis (cerebrovascular accident) History of CVA with carotid artery stenosis, as well as extensive PAD PCP recently stopped Plavix  Continue Eliquis   Hx gout Allopurinol  renal dose  GERD PPI  No concerns based on BMI: Body mass index is 24.25 kg/m.[name] Significantly low or high BMI is associated with higher medical risk.  Underweight - under 18  overweight - 25 to 29 obese - 30 or more Class 1 obesity: BMI of 30.0 to 34 Class 2 obesity: BMI of 35.0 to 39 Class 3 obesity: BMI of 40.0 to 49 Super Morbid Obesity: BMI 50-59 Super-super Morbid Obesity: BMI 60+ Healthy nutrition and physical activity advised as adjunct to other disease management and risk reduction treatments    DVT prophylaxis: heparin  IV fluids: no continuous IV fluids  Nutrition: cardiac/carb Central lines / other devices: tempcath for HD  Code Status: FULL CODE ACP documentation reviewed: none on file in [facility]  TOC needs: TBD Medical barriers to dispo: dialysis starting. Expected medical readiness  for discharge several more days at soonest.              Subjective / Brief ROS:  Patient reports feels "okay" this morning Back pain is better  Denies CP/SOB.  Pain controlled.  Denies new weakness.    Family Communication: none at bedside     Objective Findings:  Vitals:   07/20/23 0309  07/20/23 0521 07/20/23 0914 07/20/23 1002  BP: (!) 157/67  (!) 167/74 (!) 167/74  Pulse: 66  63 63  Resp: (!) 21  14   Temp: 97.9 F (36.6 C)  98.1 F (36.7 C)   TempSrc:   Oral   SpO2: 100%  95%   Weight:  83.1 kg    Height:        Intake/Output Summary (Last 24 hours) at 07/20/2023 1307 Last data filed at 07/20/2023 0615 Gross per 24 hour  Intake 687.88 ml  Output 650 ml  Net 37.88 ml   Filed Weights   07/18/23 1251 07/19/23 0459 07/20/23 0521  Weight: 80.8 kg 81.6 kg 83.1 kg    Examination:  Physical Exam Constitutional:      General: He is not in acute distress. Cardiovascular:     Rate and Rhythm: Normal rate and regular rhythm.  Pulmonary:     Effort: Pulmonary effort is normal. No respiratory distress.  Skin:    General: Skin is warm and dry.  Neurological:     General: No focal deficit present.     Mental Status: He is alert and oriented to person, place, and time. Mental status is at baseline.  Psychiatric:        Behavior: Behavior normal.          Scheduled Medications:   allopurinol   100 mg Oral Daily   amLODipine   5 mg Oral BID   apixaban   2.5 mg Oral BID   carvedilol   25 mg Oral BID WC   Chlorhexidine  Gluconate Cloth  6 each Topical Q0600   cholecalciferol   1,000 Units Oral Daily   cloNIDine   0.1 mg Oral BID   cyanocobalamin   1,000 mcg Oral Daily   hydrALAZINE   100 mg Oral TID   Ipratropium-Albuterol   1 puff Inhalation Q6H   isosorbide  dinitrate  40 mg Oral TID   pantoprazole   40 mg Oral Daily   predniSONE   20 mg Oral Q breakfast   rosuvastatin   20 mg Oral Daily   sodium chloride  flush  3 mL Intravenous Q12H    Continuous Infusions:   PRN Medications:  acetaminophen , albuterol , bisacodyl , lidocaine , lidocaine -EPINEPHrine  (PF), ondansetron  (ZOFRAN ) IV, mouth rinse, sodium chloride  flush  Antimicrobials from admission:  Anti-infectives (From admission, onward)    None           Data Reviewed:  I have personally reviewed the  following...  CBC: Recent Labs  Lab 07/14/23 0430 07/15/23 0433 07/17/23 0759 07/18/23 0951  WBC 8.1 11.5* 11.8* 14.5*  NEUTROABS 7.3 9.6*  --   --   HGB 12.9* 12.3* 11.6* 11.7*  HCT 36.5* 34.6* 32.8* 34.1*  MCV 83.0 82.8 85.0 86.5  PLT 182 175 142* 141*   Basic Metabolic Panel: Recent Labs  Lab 07/16/23 0619 07/17/23 0344 07/18/23 0423 07/19/23 0438 07/20/23 0427  NA 130* 134* 131* 132* 133*  K 3.2* 3.4* 3.3* 3.5 3.4*  CL 92* 97* 95* 95* 95*  CO2 22 24 25 25 25   GLUCOSE 128* 125* 111* 140* 110*  BUN 164* 111* 82* 57* 73*  CREATININE 4.61* 3.57*  3.01* 2.64* 2.95*  CALCIUM  8.7* 8.6* 8.5* 8.3* 8.6*  PHOS  --  5.9*  --   --  3.9   GFR: Estimated Creatinine Clearance: 26 mL/min (A) (by C-G formula based on SCr of 2.95 mg/dL (H)). Liver Function Tests: No results for input(s): "AST", "ALT", "ALKPHOS", "BILITOT", "PROT", "ALBUMIN" in the last 168 hours. No results for input(s): "LIPASE", "AMYLASE" in the last 168 hours. No results for input(s): "AMMONIA" in the last 168 hours. Coagulation Profile: No results for input(s): "INR", "PROTIME" in the last 168 hours.  Cardiac Enzymes: No results for input(s): "CKTOTAL", "CKMB", "CKMBINDEX", "TROPONINI" in the last 168 hours. BNP (last 3 results) No results for input(s): "PROBNP" in the last 8760 hours. HbA1C: No results for input(s): "HGBA1C" in the last 72 hours. CBG: Recent Labs  Lab 07/16/23 0801  GLUCAP 112*   Lipid Profile: No results for input(s): "CHOL", "HDL", "LDLCALC", "TRIG", "CHOLHDL", "LDLDIRECT" in the last 72 hours. Thyroid  Function Tests: No results for input(s): "TSH", "T4TOTAL", "FREET4", "T3FREE", "THYROIDAB" in the last 72 hours. Anemia Panel: No results for input(s): "VITAMINB12", "FOLATE", "FERRITIN", "TIBC", "IRON", "RETICCTPCT" in the last 72 hours. Most Recent Urinalysis On File:     Component Value Date/Time   COLORURINE STRAW (A) 10/29/2021 0030   APPEARANCEUR CLEAR (A) 10/29/2021 0030    LABSPEC 1.005 10/29/2021 0030   PHURINE 5.0 10/29/2021 0030   GLUCOSEU NEGATIVE 10/29/2021 0030   HGBUR NEGATIVE 10/29/2021 0030   BILIRUBINUR NEGATIVE 10/29/2021 0030   KETONESUR NEGATIVE 10/29/2021 0030   PROTEINUR NEGATIVE 10/29/2021 0030   NITRITE NEGATIVE 10/29/2021 0030   LEUKOCYTESUR NEGATIVE 10/29/2021 0030   Sepsis Labs: @LABRCNTIP (procalcitonin:4,lacticidven:4) Microbiology: Recent Results (from the past 240 hours)  Blood culture (routine x 2)     Status: None   Collection Time: 07/11/23 10:45 AM   Specimen: BLOOD RIGHT FOREARM  Result Value Ref Range Status   Specimen Description BLOOD RIGHT FOREARM  Final   Special Requests   Final    BOTTLES DRAWN AEROBIC AND ANAEROBIC Blood Culture results may not be optimal due to an inadequate volume of blood received in culture bottles   Culture   Final    NO GROWTH 5 DAYS Performed at [facility], [address] [place], [place] [address]    Report Status 07/16/2023 FINAL  Final  Blood culture (routine x 2)     Status: None   Collection Time: 07/11/23 10:45 AM   Specimen: BLOOD LEFT FOREARM  Result Value Ref Range Status   Specimen Description BLOOD LEFT FOREARM  Final   Special Requests   Final    BOTTLES DRAWN AEROBIC AND ANAEROBIC Blood Culture adequate volume   Culture   Final    NO GROWTH 5 DAYS Performed at [facility], [address] [place], [place] [address]    Report Status 07/16/2023 FINAL  Final  Resp panel by RT-PCR (RSV, Flu A&B, Covid) Anterior Nasal Swab     Status: None   Collection Time: 07/11/23  1:53 PM   Specimen: Anterior Nasal Swab  Result Value Ref Range Status   SARS Coronavirus 2 by RT PCR NEGATIVE NEGATIVE Final    Comment: (NOTE) SARS-CoV-2 target nucleic acids are NOT DETECTED.  The SARS-CoV-2 RNA is generally detectable in upper respiratory specimens during the acute phase of infection. The lowest concentration of SARS-CoV-2 viral copies this assay can detect  is 138 copies/mL. A negative result does not preclude SARS-Cov-2 infection and should not be used as the  sole basis for treatment or other patient management decisions. A negative result may occur with  improper specimen collection/handling, submission of specimen other than nasopharyngeal swab, presence of viral mutation(s) within the areas targeted by this assay, and inadequate number of viral copies(<138 copies/mL). A negative result must be combined with clinical observations, patient history, and epidemiological information. The expected result is Negative.  Fact Sheet for Patients:  BloggerCourse.com  Fact Sheet for Healthcare Providers:  SeriousBroker.it  This test is no t yet approved or cleared by the [place]  FDA and  has been authorized for detection and/or diagnosis of SARS-CoV-2 by FDA under an Emergency Use Authorization (EUA). This EUA will remain  in effect (meaning this test can be used) for the duration of the COVID-19 declaration under Section 564(b)(1) of the Act, 21 U.S.C.section 360bbb-3(b)(1), unless the authorization is terminated  or revoked sooner.       Influenza A by PCR NEGATIVE NEGATIVE Final   Influenza B by PCR NEGATIVE NEGATIVE Final    Comment: (NOTE) The Xpert Xpress SARS-CoV-2/FLU/RSV plus assay is intended as an aid in the diagnosis of influenza from Nasopharyngeal swab specimens and should not be used as a sole basis for treatment. Nasal washings and aspirates are unacceptable for Xpert Xpress SARS-CoV-2/FLU/RSV testing.  Fact Sheet for Patients: BloggerCourse.com  Fact Sheet for Healthcare Providers: SeriousBroker.it  This test is not yet approved or cleared by the [place]  FDA and has been authorized for detection and/or diagnosis of SARS-CoV-2 by FDA under an Emergency Use Authorization (EUA). This EUA will remain in effect  (meaning this test can be used) for the duration of the COVID-19 declaration under Section 564(b)(1) of the Act, 21 U.S.C. section 360bbb-3(b)(1), unless the authorization is terminated or revoked.     Resp Syncytial Virus by PCR NEGATIVE NEGATIVE Final    Comment: (NOTE) Fact Sheet for Patients: BloggerCourse.com  Fact Sheet for Healthcare Providers: SeriousBroker.it  This test is not yet approved or cleared by the [place]  FDA and has been authorized for detection and/or diagnosis of SARS-CoV-2 by FDA under an Emergency Use Authorization (EUA). This EUA will remain in effect (meaning this test can be used) for the duration of the COVID-19 declaration under Section 564(b)(1) of the Act, 21 U.S.C. section 360bbb-3(b)(1), unless the authorization is terminated or revoked.  Performed at [facility], [address] [place], [place] [address]       Radiology Studies last 3 days: PERIPHERAL VASCULAR CATHETERIZATION Result Date: 07/16/2023 See surgical note for result.        Leomia Blake, DO [facility] 07/20/2023, 1:07 PM    Dictation software may have been used to generate the above note. Typos may occur and escape review in typed/dictated notes. Please contact Dr Authur Leghorn directly for clarity if needed.  Staff may message me via secure chat in Epic  but this may not receive an immediate response,  please page me for urgent matters!  If 7PM-7AM, please contact night coverage [email or web address]

## 2023-07-20 NOTE — Progress Notes (Signed)
 Physical Therapy Treatment Patient Details Name: Gary Mccoy. MRN: [id number] DOB: [birth year] Today's Date: 07/20/2023   History of Present Illness Gary Mccoy. is a 72yoM, PMH: HFpEF, CAD, PAD, CAS, COPD, CKD-IV, HLD, CVA, who presents to the ED due to shortness of breath. Pt was placed on BIPAP and weaned to 3L [place] O2, likely associated with CHF + COPD.    PT Comments  Pt reported back pain from being in the bed and agreeable to ambulation this session.  Pt declined sitting in recliner at end of session.  Pt was generally steady with transfers with good eccentric and concentric control and stability.  Pt able to perform two bouts of amb each around 75 feet with short therapeutic rest between walks.  Pt ambulated without an AD on 2LO2/min with slow cadence and short bilateral step length and occasional mild drifting left/right but was able to easily self-correct with no overt LOB.  Pt's SpO2 dropped to a low of 86% during amb on 2L but quickly increased back to the low 90s at rest with PLB.  Pt will benefit from continued PT services upon discharge to safely address deficits listed in patient problem list for decreased caregiver assistance and eventual return to PLOF.    If plan is discharge home, recommend the following: A little help with walking and/or transfers;A little help with bathing/dressing/bathroom;Assistance with cooking/housework;Assist for transportation;Help with stairs or ramp for entrance;Direct supervision/assist for medications management   Can travel by private vehicle        Equipment Recommendations  None recommended by PT    Recommendations for Other Services       Precautions / Restrictions Precautions Precautions: Fall Recall of Precautions/Restrictions: Intact Restrictions Weight Bearing Restrictions Per Provider Order: No     Mobility  Bed Mobility Overal bed mobility: Modified Independent             General bed mobility comments:  Min extra time and effort only    Transfers Overall transfer level: Independent Equipment used: None Transfers: Sit to/from [employer] transfer comment: Good eccentric and concentric control and stability    Ambulation/Gait Ambulation/Gait assistance: Supervision Gait Distance (Feet): 75 Feet x 2 Assistive device: None Gait Pattern/deviations: Step-through pattern, Decreased step length - right, Decreased step length - left, Drifts right/left Gait velocity: decreased     General Gait Details: Slow cadence with short B step length and occasional mild drifting left/right but generally steady with no overt LOB   Stairs             Wheelchair Mobility     Tilt Bed    Modified Rankin (Stroke Patients Only)       Balance Overall balance assessment: Needs assistance   Sitting balance-Leahy Scale: Normal     Standing balance support: No upper extremity supported, During functional activity Standing balance-Leahy Scale: Good                              Communication Communication Communication: No apparent difficulties  Cognition Arousal: Alert Behavior During Therapy: Agitated   PT - Cognitive impairments: No apparent impairments                         Following commands: Intact      Cueing Cueing Techniques: Verbal cues  Exercises  General Comments        Pertinent Vitals/Pain Pain Assessment Pain Assessment: 0-10 Pain Score: 6  Pain Location: back pain Pain Descriptors / Indicators: Sore Pain Intervention(s): Repositioned, Monitored during session    Home Living                          Prior Function            PT Goals (current goals can now be found in the care plan section) Progress towards PT goals: PT to reassess next treatment    Frequency    Min 1X/week      PT Plan      Co-evaluation              AM-PAC PT "6 Clicks" Mobility   Outcome Measure  Help  needed turning from your back to your side while in a flat bed without using bedrails?: None Help needed moving from lying on your back to sitting on the side of a flat bed without using bedrails?: None Help needed moving to and from a bed to a chair (including a wheelchair)?: A Little Help needed standing up from a chair using your arms (e.g., wheelchair or bedside chair)?: A Little Help needed to walk in hospital room?: A Little Help needed climbing 3-5 steps with a railing? : A Little 6 Click Score: 20    End of Session Equipment Utilized During Treatment: Gait belt;Oxygen Activity Tolerance: Patient tolerated treatment well Patient left: in bed;with call bell/phone within reach;with family/visitor present Nurse Communication: Mobility status PT Visit Diagnosis: Unsteadiness on feet (R26.81);Muscle weakness (generalized) (M62.81)     Time: 6045-4098 PT Time Calculation (min) (ACUTE ONLY): 11 min  Charges:    $Gait Training: 8-22 mins PT General Charges $$ ACUTE PT VISIT: 1 Visit                     D. Scott Ramon Zanders PT, DPT 07/20/23, 3:20 PM

## 2023-07-20 NOTE — Progress Notes (Signed)
 [facility]  ROUNDING NOTE   Subjective:  [name] was admitted to [facility] on 06/21/2023 for shortness of breath with suspect of CHF vs COPD exacerbation. Patient reports increase weight gain of 13lbs and states his concern for repeat of previous CHF exacerbation.   Patient is followed by Dr Gary Mccoy outpatient.   Update:  Patient seen sitting at side of bed Alert, frustrated with state of health Continues to feel "bad" Remains on 2L [place] [employer] concerns that he doesn't want to be a burden But also does not want to die and understands he may need continued dialysis.     Objective:  Vital signs in last 24 hours:  Temp:  [97.6 F (36.4 C)-98.7 F (37.1 C)] 98.1 F (36.7 C) (05/04 0914) Pulse Rate:  [62-67] 63 (05/04 1002) Resp:  [14-21] 14 (05/04 0914) BP: (146-167)/(66-74) 167/74 (05/04 1002) SpO2:  [95 %-100 %] 95 % (05/04 0914) Weight:  [83.1 kg] 83.1 kg (05/04 0521)  Weight change: 1.844 kg Filed Weights   07/18/23 1251 07/19/23 0459 07/20/23 0521  Weight: 80.8 kg 81.6 kg 83.1 kg    Intake/Output: I/O last 3 completed shifts: In: 1077.9 [P.O.:990; I.V.:87.9] Out: 950 [Urine:950]   Intake/Output this shift:  No intake/output data recorded.  Physical Exam: General: NAD  Head: Normocephalic, atraumatic. Moist oral mucosal membranes  Eyes: Anicteric  Lungs:  2L 02, clear to auscultation  Heart: Regular rate and rhythm  Abdomen:  Soft, nontender  Extremities:  No peripheral edema.  Neurologic: Alert, moving all four extremities  Skin: No lesions  Access: None    Basic Metabolic Panel: Recent Labs  Lab 07/16/23 0619 07/17/23 0344 07/18/23 0423 07/19/23 0438 07/20/23 0427  NA 130* 134* 131* 132* 133*  K 3.2* 3.4* 3.3* 3.5 3.4*  CL 92* 97* 95* 95* 95*  CO2 22 24 25 25 25   GLUCOSE 128* 125* 111* 140* 110*  BUN 164* 111* 82* 57* 73*  CREATININE 4.61* 3.57* 3.01* 2.64* 2.95*  CALCIUM  8.7* 8.6* 8.5* 8.3* 8.6*  PHOS  --  5.9*  --   --   3.9    Liver Function Tests: No results for input(s): "AST", "ALT", "ALKPHOS", "BILITOT", "PROT", "ALBUMIN" in the last 168 hours. No results for input(s): "LIPASE", "AMYLASE" in the last 168 hours. No results for input(s): "AMMONIA" in the last 168 hours.  CBC: Recent Labs  Lab 07/14/23 0430 07/15/23 0433 07/17/23 0759 07/18/23 0951  WBC 8.1 11.5* 11.8* 14.5*  NEUTROABS 7.3 9.6*  --   --   HGB 12.9* 12.3* 11.6* 11.7*  HCT 36.5* 34.6* 32.8* 34.1*  MCV 83.0 82.8 85.0 86.5  PLT 182 175 142* 141*    Cardiac Enzymes: No results for input(s): "CKTOTAL", "CKMB", "CKMBINDEX", "TROPONINI" in the last 168 hours.  BNP: Invalid input(s): "POCBNP"  CBG: Recent Labs  Lab 07/16/23 0801  GLUCAP 112*    Microbiology: Results for orders placed or performed during the hospital encounter of 07/11/23  Blood culture (routine x 2)     Status: None   Collection Time: 07/11/23 10:45 AM   Specimen: BLOOD RIGHT FOREARM  Result Value Ref Range Status   Specimen Description BLOOD RIGHT FOREARM  Final   Special Requests   Final    BOTTLES DRAWN AEROBIC AND ANAEROBIC Blood Culture results may not be optimal due to an inadequate volume of blood received in culture bottles   Culture   Final    NO GROWTH 5 DAYS Performed at [facility], [address], [place], [place] [address]    Report Status 07/16/2023 FINAL  Final  Blood culture (routine x 2)     Status: None   Collection Time: 07/11/23 10:45 AM   Specimen: BLOOD LEFT FOREARM  Result Value Ref Range Status   Specimen Description BLOOD LEFT FOREARM  Final   Special Requests   Final    BOTTLES DRAWN AEROBIC AND ANAEROBIC Blood Culture adequate volume   Culture   Final    NO GROWTH 5 DAYS Performed at [facility], [address] [place], [place] [address]    Report Status 07/16/2023 FINAL  Final  Resp panel by RT-PCR (RSV, Flu A&B, Covid) Anterior Nasal Swab     Status: None   Collection Time: 07/11/23  1:53 PM    Specimen: Anterior Nasal Swab  Result Value Ref Range Status   SARS Coronavirus 2 by RT PCR NEGATIVE NEGATIVE Final    Comment: (NOTE) SARS-CoV-2 target nucleic acids are NOT DETECTED.  The SARS-CoV-2 RNA is generally detectable in upper respiratory specimens during the acute phase of infection. The lowest concentration of SARS-CoV-2 viral copies this assay can detect is 138 copies/mL. A negative result does not preclude SARS-Cov-2 infection and should not be used as the sole basis for treatment or other patient management decisions. A negative result may occur with  improper specimen collection/handling, submission of specimen other than nasopharyngeal swab, presence of viral mutation(s) within the areas targeted by this assay, and inadequate number of viral copies(<138 copies/mL). A negative result must be combined with clinical observations, patient history, and epidemiological information. The expected result is Negative.  Fact Sheet for Patients:  BloggerCourse.com  Fact Sheet for Healthcare Providers:  SeriousBroker.it  This test is no t yet approved or cleared by the [place]  FDA and  has been authorized for detection and/or diagnosis of SARS-CoV-2 by FDA under an Emergency Use Authorization (EUA). This EUA will remain  in effect (meaning this test can be used) for the duration of the COVID-19 declaration under Section 564(b)(1) of the Act, 21 U.S.C.section 360bbb-3(b)(1), unless the authorization is terminated  or revoked sooner.       Influenza A by PCR NEGATIVE NEGATIVE Final   Influenza B by PCR NEGATIVE NEGATIVE Final    Comment: (NOTE) The Xpert Xpress SARS-CoV-2/FLU/RSV plus assay is intended as an aid in the diagnosis of influenza from Nasopharyngeal swab specimens and should not be used as a sole basis for treatment. Nasal washings and aspirates are unacceptable for Xpert Xpress  SARS-CoV-2/FLU/RSV testing.  Fact Sheet for Patients: BloggerCourse.com  Fact Sheet for Healthcare Providers: SeriousBroker.it  This test is not yet approved or cleared by the [place]  FDA and has been authorized for detection and/or diagnosis of SARS-CoV-2 by FDA under an Emergency Use Authorization (EUA). This EUA will remain in effect (meaning this test can be used) for the duration of the COVID-19 declaration under Section 564(b)(1) of the Act, 21 U.S.C. section 360bbb-3(b)(1), unless the authorization is terminated or revoked.     Resp Syncytial Virus by PCR NEGATIVE NEGATIVE Final    Comment: (NOTE) Fact Sheet for Patients: BloggerCourse.com  Fact Sheet for Healthcare Providers: SeriousBroker.it  This test is not yet approved or cleared by the [place]  FDA and has been authorized for detection and/or diagnosis of SARS-CoV-2 by FDA under an Emergency Use Authorization (EUA). This EUA will remain in effect (meaning this test can be used) for the duration of the COVID-19 declaration under  Section 564(b)(1) of the Act, 21 U.S.C. section 360bbb-3(b)(1), unless the authorization is terminated or revoked.  Performed at [facility], [address] [place], [place] [address]     Coagulation Studies: No results for input(s): "LABPROT", "INR" in the last 72 hours.   Urinalysis: No results for input(s): "COLORURINE", "LABSPEC", "PHURINE", "GLUCOSEU", "HGBUR", "BILIRUBINUR", "KETONESUR", "PROTEINUR", "UROBILINOGEN", "NITRITE", "LEUKOCYTESUR" in the last 72 hours.  Invalid input(s): "APPERANCEUR"    Imaging: No results found.     Medications:      allopurinol   100 mg Oral Daily   amLODipine   5 mg Oral BID   apixaban   2.5 mg Oral BID   carvedilol   25 mg Oral BID WC   Chlorhexidine  Gluconate Cloth  6 each Topical Q0600   cholecalciferol   1,000  Units Oral Daily   cloNIDine   0.1 mg Oral BID   cyanocobalamin   1,000 mcg Oral Daily   hydrALAZINE   100 mg Oral TID   Ipratropium-Albuterol   1 puff Inhalation Q6H   isosorbide  dinitrate  40 mg Oral TID   pantoprazole   40 mg Oral Daily   predniSONE   20 mg Oral Q breakfast   rosuvastatin   20 mg Oral Daily   sodium chloride  flush  3 mL Intravenous Q12H   acetaminophen , albuterol , bisacodyl , lidocaine , lidocaine -EPINEPHrine  (PF), ondansetron  (ZOFRAN ) IV, mouth rinse, sodium chloride  flush  Assessment/ Plan:  [name] is a 73 y.o.  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  Creatinine slightly elevated today, no unexpected. Patient continues to have decreased urine output. Discussed with patient that he may need further dialysis, will consider permcath placement.   Lab Results  Component Value Date   CREATININE 2.95 (H) 07/20/2023   CREATININE 2.64 (H) 07/19/2023   CREATININE 3.01 (H) 07/18/2023     Intake/Output Summary (Last 24 hours) at 07/20/2023 1215 Last data filed at 07/20/2023 0615 Gross per 24 hour  Intake 687.88 ml  Output 650 ml  Net 37.88 ml      Acute Respiratory distress 2/2 Acute COPD vs Acute exacerbation of chronic systolic and diastolic congestive heart failure.   -06/18/22 ECHO LV EF 50%-60%. Repeat ECHO pending results. Chest CT ordered - Room air .   3. Anemia with chronic kidney disease:  HgB 11.7.   Hypertension with chronic kidney disease. Currently on 40 mg isosorbide  dinitrate, metoprolol  25 mg daily, hydralazine  100 mg TID, amlodipine  5 mg, carvedilol   12.5 mg BID, and clonidine   Blood pressure 167/74  Secondary Hyperparathyroidism  Ca 8.6, Phos, PTH 124 Will continue to monitor bone minerals.     LOS: 8 Gary Mccoy 5/4/202512:15 PM

## 2023-07-21 ENCOUNTER — Encounter
Admission: EM | Disposition: A | Discharge status: Home / Self Care | Payer: Self-pay | Source: Home / Self Care | Attending: Osteopathic Medicine

## 2023-07-21 DIAGNOSIS — J9621 Acute and chronic respiratory failure with hypoxia: Secondary | ICD-10-CM | POA: Diagnosis not present

## 2023-07-21 LAB — BASIC METABOLIC PANEL WITH GFR
Anion gap: 14 (ref 5–15)
BUN: 76 mg/dL — ABNORMAL HIGH (ref 8–23)
CO2: 22 mmol/L (ref 22–32)
Calcium: 8.7 mg/dL — ABNORMAL LOW (ref 8.9–10.3)
Chloride: 98 mmol/L (ref 98–111)
Creatinine, Ser: 3.43 mg/dL — ABNORMAL HIGH (ref 0.61–1.24)
GFR, Estimated: 18 mL/min — ABNORMAL LOW (ref >60)
Glucose, Bld: 98 mg/dL (ref 70–99)
Potassium: 3.5 mmol/L (ref 3.5–5.1)
Sodium: 134 mmol/L — ABNORMAL LOW (ref 135–145)

## 2023-07-21 SURGERY — DIALYSIS/PERMA CATHETER INSERTION
Anesthesia: Moderate Sedation

## 2023-07-21 NOTE — Plan of Care (Signed)
  Problem: Education: Goal: Knowledge of General Education information will improve Description: Including pain rating scale, medication(s)/side effects and non-pharmacologic comfort measures Outcome: Progressing   Problem: Health Behavior/Discharge Planning: Goal: Ability to manage health-related needs will improve Outcome: Progressing   Problem: Clinical Measurements: Goal: Ability to maintain clinical measurements within normal limits will improve Outcome: Progressing Goal: Will remain free from infection Outcome: Progressing Goal: Diagnostic test results will improve Outcome: Progressing Goal: Respiratory complications will improve Outcome: Progressing Goal: Cardiovascular complication will be avoided Outcome: Progressing   Problem: Activity: Goal: Risk for activity intolerance will decrease Outcome: Progressing   Problem: Nutrition: Goal: Adequate nutrition will be maintained Outcome: Progressing   Problem: Coping: Goal: Level of anxiety will decrease Outcome: Progressing   Problem: Elimination: Goal: Will not experience complications related to bowel motility Outcome: Progressing Goal: Will not experience complications related to urinary retention Outcome: Progressing   Problem: Pain Managment: Goal: General experience of comfort will improve and/or be controlled Outcome: Progressing   Problem: Safety: Goal: Ability to remain free from injury will improve Outcome: Progressing   Problem: Skin Integrity: Goal: Risk for impaired skin integrity will decrease Outcome: Progressing   Problem: Education: Goal: Knowledge of disease or condition will improve Outcome: Progressing Goal: Knowledge of the prescribed therapeutic regimen will improve Outcome: Progressing Goal: Individualized Educational Video(s) Outcome: Progressing   Problem: Respiratory: Goal: Ability to maintain a clear airway will improve Outcome: Progressing Goal: Levels of oxygenation will  improve Outcome: Progressing Goal: Ability to maintain adequate ventilation will improve Outcome: Progressing   Problem: Activity: Goal: Ability to tolerate increased activity will improve Outcome: Progressing Goal: Will verbalize the importance of balancing activity with adequate rest periods Outcome: Progressing

## 2023-07-21 NOTE — Plan of Care (Signed)

## 2023-07-21 NOTE — Progress Notes (Signed)
 [facility]  ROUNDING NOTE   Subjective:  [name] was admitted to [facility] on 06/21/2023 for shortness of breath with suspect of CHF vs COPD exacerbation. Patient reports increase weight gain of 13lbs and states his concern for repeat of previous CHF exacerbation.   Patient is followed by Dr Zelda Hickman outpatient.   Update:  Patient sitting  at bedside Tolerating meals States he has urinated more since yesterday  Creatinine 3.43   Objective:  Vital signs in last 24 hours:  Temp:  [97.7 F (36.5 C)-98.2 F (36.8 C)] 98.1 F (36.7 C) (05/05 0739) Pulse Rate:  [59-68] 59 (05/05 0739) Resp:  [16-18] 18 (05/05 0739) BP: (161-171)/(66-74) 161/72 (05/05 0739) SpO2:  [93 %-97 %] 97 % (05/05 0739) Weight:  [82.9 kg] 82.9 kg (05/05 0500)  Weight change: -0.227 kg Filed Weights   07/19/23 0459 07/20/23 0521 07/21/23 0500  Weight: 81.6 kg 83.1 kg 82.9 kg    Intake/Output: I/O last 3 completed shifts: In: 840 [P.O.:840] Out: 900 [Urine:900]   Intake/Output this shift:  Total I/O In: 480 [P.O.:480] Out: 550 [Urine:400; Blood:150]  Physical Exam: General: NAD  Head: Normocephalic, atraumatic. Moist oral mucosal membranes  Eyes: Anicteric  Lungs:  2L 02, clear to auscultation  Heart: Regular rate and rhythm  Abdomen:  Soft, nontender  Extremities:  No peripheral edema.  Neurologic: Alert, moving all four extremities  Skin: No lesions  Access: None    Basic Metabolic Panel: Recent Labs  Lab 07/17/23 0344 07/18/23 0423 07/19/23 0438 07/20/23 0427 07/21/23 0439  NA 134* 131* 132* 133* 134*  K 3.4* 3.3* 3.5 3.4* 3.5  CL 97* 95* 95* 95* 98  CO2 24 25 25 25 22   GLUCOSE 125* 111* 140* 110* 98  BUN 111* 82* 57* 73* 76*  CREATININE 3.57* 3.01* 2.64* 2.95* 3.43*  CALCIUM  8.6* 8.5* 8.3* 8.6* 8.7*  PHOS 5.9*  --   --  3.9  --     Liver Function Tests: No results for input(s): "AST", "ALT", "ALKPHOS", "BILITOT", "PROT", "ALBUMIN" in the last 168  hours. No results for input(s): "LIPASE", "AMYLASE" in the last 168 hours. No results for input(s): "AMMONIA" in the last 168 hours.  CBC: Recent Labs  Lab 07/15/23 0433 07/17/23 0759 07/18/23 0951  WBC 11.5* 11.8* 14.5*  NEUTROABS 9.6*  --   --   HGB 12.3* 11.6* 11.7*  HCT 34.6* 32.8* 34.1*  MCV 82.8 85.0 86.5  PLT 175 142* 141*    Cardiac Enzymes: No results for input(s): "CKTOTAL", "CKMB", "CKMBINDEX", "TROPONINI" in the last 168 hours.  BNP: Invalid input(s): "POCBNP"  CBG: Recent Labs  Lab 07/16/23 0801  GLUCAP 112*    Microbiology: Results for orders placed or performed during the hospital encounter of 07/11/23  Blood culture (routine x 2)     Status: None   Collection Time: 07/11/23 10:45 AM   Specimen: BLOOD RIGHT FOREARM  Result Value Ref Range Status   Specimen Description BLOOD RIGHT FOREARM  Final   Special Requests   Final    BOTTLES DRAWN AEROBIC AND ANAEROBIC Blood Culture results may not be optimal due to an inadequate volume of blood received in culture bottles   Culture   Final    NO GROWTH 5 DAYS Performed at [facility], [address], [place], [place] [address]    Report Status 07/16/2023 FINAL  Final  Blood culture (routine x 2)     Status: None   Collection Time: 07/11/23  10:45 AM   Specimen: BLOOD LEFT FOREARM  Result Value Ref Range Status   Specimen Description BLOOD LEFT FOREARM  Final   Special Requests   Final    BOTTLES DRAWN AEROBIC AND ANAEROBIC Blood Culture adequate volume   Culture   Final    NO GROWTH 5 DAYS Performed at [facility], [address] [place], [place] [address]    Report Status 07/16/2023 FINAL  Final  Resp panel by RT-PCR (RSV, Flu A&B, Covid) Anterior Nasal Swab     Status: None   Collection Time: 07/11/23  1:53 PM   Specimen: Anterior Nasal Swab  Result Value Ref Range Status   SARS Coronavirus 2 by RT PCR NEGATIVE NEGATIVE Final    Comment: (NOTE) SARS-CoV-2 target nucleic  acids are NOT DETECTED.  The SARS-CoV-2 RNA is generally detectable in upper respiratory specimens during the acute phase of infection. The lowest concentration of SARS-CoV-2 viral copies this assay can detect is 138 copies/mL. A negative result does not preclude SARS-Cov-2 infection and should not be used as the sole basis for treatment or other patient management decisions. A negative result may occur with  improper specimen collection/handling, submission of specimen other than nasopharyngeal swab, presence of viral mutation(s) within the areas targeted by this assay, and inadequate number of viral copies(<138 copies/mL). A negative result must be combined with clinical observations, patient history, and epidemiological information. The expected result is Negative.  Fact Sheet for Patients:  BloggerCourse.com  Fact Sheet for Healthcare Providers:  SeriousBroker.it  This test is no t yet approved or cleared by the [place]  FDA and  has been authorized for detection and/or diagnosis of SARS-CoV-2 by FDA under an Emergency Use Authorization (EUA). This EUA will remain  in effect (meaning this test can be used) for the duration of the COVID-19 declaration under Section 564(b)(1) of the Act, 21 U.S.C.section 360bbb-3(b)(1), unless the authorization is terminated  or revoked sooner.       Influenza A by PCR NEGATIVE NEGATIVE Final   Influenza B by PCR NEGATIVE NEGATIVE Final    Comment: (NOTE) The Xpert Xpress SARS-CoV-2/FLU/RSV plus assay is intended as an aid in the diagnosis of influenza from Nasopharyngeal swab specimens and should not be used as a sole basis for treatment. Nasal washings and aspirates are unacceptable for Xpert Xpress SARS-CoV-2/FLU/RSV testing.  Fact Sheet for Patients: BloggerCourse.com  Fact Sheet for Healthcare Providers: SeriousBroker.it  This  test is not yet approved or cleared by the [place]  FDA and has been authorized for detection and/or diagnosis of SARS-CoV-2 by FDA under an Emergency Use Authorization (EUA). This EUA will remain in effect (meaning this test can be used) for the duration of the COVID-19 declaration under Section 564(b)(1) of the Act, 21 U.S.C. section 360bbb-3(b)(1), unless the authorization is terminated or revoked.     Resp Syncytial Virus by PCR NEGATIVE NEGATIVE Final    Comment: (NOTE) Fact Sheet for Patients: BloggerCourse.com  Fact Sheet for Healthcare Providers: SeriousBroker.it  This test is not yet approved or cleared by the [place]  FDA and has been authorized for detection and/or diagnosis of SARS-CoV-2 by FDA under an Emergency Use Authorization (EUA). This EUA will remain in effect (meaning this test can be used) for the duration of the COVID-19 declaration under Section 564(b)(1) of the Act, 21 U.S.C. section 360bbb-3(b)(1), unless the authorization is terminated or revoked.  Performed at [facility], [address] [place], [place] [address]     Coagulation  Studies: No results for input(s): "LABPROT", "INR" in the last 72 hours.   Urinalysis: No results for input(s): "COLORURINE", "LABSPEC", "PHURINE", "GLUCOSEU", "HGBUR", "BILIRUBINUR", "KETONESUR", "PROTEINUR", "UROBILINOGEN", "NITRITE", "LEUKOCYTESUR" in the last 72 hours.  Invalid input(s): "APPERANCEUR"    Imaging: No results found.     Medications:      allopurinol   100 mg Oral Daily   amLODipine   5 mg Oral BID   apixaban   2.5 mg Oral BID   carvedilol   25 mg Oral BID WC   Chlorhexidine  Gluconate Cloth  6 each Topical Q0600   cholecalciferol   1,000 Units Oral Daily   cloNIDine   0.1 mg Oral BID   cyanocobalamin   1,000 mcg Oral Daily   hydrALAZINE   100 mg Oral TID   Ipratropium-Albuterol   1 puff Inhalation Q6H   isosorbide  dinitrate   40 mg Oral TID   pantoprazole   40 mg Oral Daily   predniSONE   20 mg Oral Q breakfast   rosuvastatin   20 mg Oral Daily   sodium chloride  flush  3 mL Intravenous Q12H   acetaminophen , albuterol , bisacodyl , lidocaine , lidocaine -EPINEPHrine  (PF), ondansetron  (ZOFRAN ) IV, mouth rinse, sodium chloride  flush  Assessment/ Plan:  Mr. Paiton Ziehm. is a 73 y.o.  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 Renal ultrasound shows atrophic right kidney.  Creatinine continues to rise slowly. Last dialysis treatment completed on Friday. Will perform 24 hour creatinine clearance to evaluate renal function and determine further need for dialysis.   Lab Results  Component Value Date   CREATININE 3.43 (H) 07/21/2023   CREATININE 2.95 (H) 07/20/2023   CREATININE 2.64 (H) 07/19/2023     Intake/Output Summary (Last 24 hours) at 07/21/2023 1459 Last data filed at 07/21/2023 1400 Gross per 24 hour  Intake 1080 ml  Output 700 ml  Net 380 ml      Acute Respiratory distress 2/2 Acute COPD vs Acute exacerbation of chronic systolic and diastolic congestive heart failure.   -06/18/22 ECHO LV EF 50%-60%. Repeat ECHO pending results. Chest CT ordered - Room air .   3. Anemia with chronic kidney disease:  HgB 11.7, acceptable.    Hypertension with chronic kidney disease. Currently on 40 mg isosorbide  dinitrate, metoprolol  25 mg daily, hydralazine  100 mg TID, amlodipine  5 mg, carvedilol  12.5 mg BID, and clonidine   Blood pressure stable, 161/72.  Secondary Hyperparathyroidism  Ca 8.6, Phos, PTH 124 Calcium  and  phosphorus within optimal range.    LOS: 9 Tevon Berhane 5/5/20252:59 PM

## 2023-07-21 NOTE — Progress Notes (Signed)
 PROGRESS NOTE    Gary Mccoy.   HQI:[id number] DOB: [birth year]  DOA: 07/11/2023 Date of Service: 07/21/23 which is hospital day 9  PCP: Sari Cunning, MD    Hospital course / significant events:   HPI: 73yo with h/o HFrEF, CAD/PAD, carotid stenosis, stage IV CKD, COPD, HLD, and CVA who presented on 4/25 with SOB.    04/25: admitted to hospitalist w/ resp fail requiring BiPap, d/t COPD and HFrEF exacerbations.  04/26: on [place] O2, Nephrology consult for cardiorenal syndrome / advancing CKD. Started lasix  drip. Echo done.  04/27: continue lasix  drip 04/28: lasix  drip d/c  04/29: no immediate dialysis need but monitoring off lasix   04/30: tempcath placement, dialysis was started today 05/01-05/02: dialysis daily 05/03-05/04: holding HD for now, cr up through the weekend 05/05: permcath placement was tentatively planned for today but nephrology opts to do 24h Cr clearance and hold off on permath/HD for now      Consultants:  Nephrology  Vascular surgery   Procedures/Surgeries: 07/16/23 R femoral temporary dialysis catheter placed      ASSESSMENT & PLAN:   Acute hypoxic respiratory failure Due to acute on chronic heart failure +/- bronchitis/COPD exacerbation  Required BiPAP on presentation, weaned to [place] O2 O2 support as needed  Treat underlying cause(s)   Cardiorenal syndrome complicated by stage IV CKD question progression to ESRD  Patient's renal dysfunction appears to be stable, but he appears unable to effectively balance CHF associated with volume overload, not improved with Lasix  Nephrology following  permcath placement was tentatively planned for today but nephrology opts to do 24h Cr clearance and hold off on permath/HD for now  Monitor BMP    Acute on chronic combined CHF  Echo with EF 45-50%, global hypokinesis, moderate to severe LV dilatation, grade 1 DD IV Lasix  --> Lasix  infusion stopped 4/28 --> off diuresis and monitoring renal fxn  Dialysis  initiated  Strict I&O Beta blocker w/ carvedilol  No ASA on Eliquis  Statin     HLD CAD hx percutaneous coronary angioplasty HTN Amlodipine , carvedilol , clonidine , hydralazine  Isosrobide Crestor      COPD w/ questionable exacerbation given shortness of breath and cough but SOB worsened despite Levaquin and steroids x 4 days PTA, more likely d/t CHF  Combivent  prednisone  taper Albuterol  q2h prn No current wheezing noted   History of CVA and bilateral carotid artery stenosis (cerebrovascular accident) History of CVA with carotid artery stenosis, as well as extensive PAD PCP recently stopped Plavix  Continue Eliquis , Crestor   Hx gout Allopurinol  renal dose  GERD PPI  No concerns based on BMI: Body mass index is 24.25 kg/m.[name] Significantly low or high BMI is associated with higher medical risk.  Underweight - under 18  overweight - 25 to 29 obese - 30 or more Class 1 obesity: BMI of 30.0 to 34 Class 2 obesity: BMI of 35.0 to 39 Class 3 obesity: BMI of 40.0 to 49 Super Morbid Obesity: BMI 50-59 Super-super Morbid Obesity: BMI 60+ Healthy nutrition and physical activity advised as adjunct to other disease management and risk reduction treatments    DVT prophylaxis: heparin  IV fluids: no continuous IV fluids  Nutrition: cardiac/carb Central lines / other devices: tempcath for HD  Code Status: FULL CODE ACP documentation reviewed: none on file in [facility]  TOC needs: TBD Medical barriers to dispo: dialysis starting. Expected medical readiness for discharge several more days / need outpatietn HD set up             Subjective /  Brief ROS:  Patient reports feels "okay" this morning Denies CP/SOB.  Pain controlled.  Denies new weakness.    Family Communication: none at bedside     Objective Findings:  Vitals:   07/20/23 1952 07/21/23 0004 07/21/23 0500 07/21/23 0739  BP: (!) 163/66 (!) 162/68  (!) 161/72  Pulse: 68 66  (!) 59  Resp:  16  18  Temp: 98.2  F (36.8 C) 98.2 F (36.8 C)  98.1 F (36.7 C)  TempSrc: Oral Oral  Oral  SpO2: 93% 93%  97%  Weight:   82.9 kg   Height:        Intake/Output Summary (Last 24 hours) at 07/21/2023 1149 Last data filed at 07/21/2023 1000 Gross per 24 hour  Intake 840 ml  Output 850 ml  Net -10 ml   Filed Weights   07/19/23 0459 07/20/23 0521 07/21/23 0500  Weight: 81.6 kg 83.1 kg 82.9 kg    Examination:  Physical Exam Constitutional:      General: He is not in acute distress. Cardiovascular:     Rate and Rhythm: Normal rate and regular rhythm.  Pulmonary:     Effort: Pulmonary effort is normal. No respiratory distress.  Skin:    General: Skin is warm and dry.  Neurological:     General: No focal deficit present.     Mental Status: He is alert and oriented to person, place, and time. Mental status is at baseline.  Psychiatric:        Behavior: Behavior normal.          Scheduled Medications:   allopurinol   100 mg Oral Daily   amLODipine   5 mg Oral BID   apixaban   2.5 mg Oral BID   carvedilol   25 mg Oral BID WC   Chlorhexidine  Gluconate Cloth  6 each Topical Q0600   cholecalciferol   1,000 Units Oral Daily   cloNIDine   0.1 mg Oral BID   cyanocobalamin   1,000 mcg Oral Daily   hydrALAZINE   100 mg Oral TID   Ipratropium-Albuterol   1 puff Inhalation Q6H   isosorbide  dinitrate  40 mg Oral TID   pantoprazole   40 mg Oral Daily   predniSONE   20 mg Oral Q breakfast   rosuvastatin   20 mg Oral Daily   sodium chloride  flush  3 mL Intravenous Q12H    Continuous Infusions:   PRN Medications:  acetaminophen , albuterol , bisacodyl , lidocaine , lidocaine -EPINEPHrine  (PF), ondansetron  (ZOFRAN ) IV, mouth rinse, sodium chloride  flush  Antimicrobials from admission:  Anti-infectives (From admission, onward)    None           Data Reviewed:  I have personally reviewed the following...  CBC: Recent Labs  Lab 07/15/23 0433 07/17/23 0759 07/18/23 0951  WBC 11.5* 11.8* 14.5*   NEUTROABS 9.6*  --   --   HGB 12.3* 11.6* 11.7*  HCT 34.6* 32.8* 34.1*  MCV 82.8 85.0 86.5  PLT 175 142* 141*   Basic Metabolic Panel: Recent Labs  Lab 07/17/23 0344 07/18/23 0423 07/19/23 0438 07/20/23 0427 07/21/23 0439  NA 134* 131* 132* 133* 134*  K 3.4* 3.3* 3.5 3.4* 3.5  CL 97* 95* 95* 95* 98  CO2 24 25 25 25 22   GLUCOSE 125* 111* 140* 110* 98  BUN 111* 82* 57* 73* 76*  CREATININE 3.57* 3.01* 2.64* 2.95* 3.43*  CALCIUM  8.6* 8.5* 8.3* 8.6* 8.7*  PHOS 5.9*  --   --  3.9  --    GFR: Estimated Creatinine Clearance: 22.3 mL/min (A) (  by C-G formula based on SCr of 3.43 mg/dL (H)). Liver Function Tests: No results for input(s): "AST", "ALT", "ALKPHOS", "BILITOT", "PROT", "ALBUMIN" in the last 168 hours. No results for input(s): "LIPASE", "AMYLASE" in the last 168 hours. No results for input(s): "AMMONIA" in the last 168 hours. Coagulation Profile: No results for input(s): "INR", "PROTIME" in the last 168 hours.  Cardiac Enzymes: No results for input(s): "CKTOTAL", "CKMB", "CKMBINDEX", "TROPONINI" in the last 168 hours. BNP (last 3 results) No results for input(s): "PROBNP" in the last 8760 hours. HbA1C: No results for input(s): "HGBA1C" in the last 72 hours. CBG: Recent Labs  Lab 07/16/23 0801  GLUCAP 112*   Lipid Profile: No results for input(s): "CHOL", "HDL", "LDLCALC", "TRIG", "CHOLHDL", "LDLDIRECT" in the last 72 hours. Thyroid  Function Tests: No results for input(s): "TSH", "T4TOTAL", "FREET4", "T3FREE", "THYROIDAB" in the last 72 hours. Anemia Panel: No results for input(s): "VITAMINB12", "FOLATE", "FERRITIN", "TIBC", "IRON", "RETICCTPCT" in the last 72 hours. Most Recent Urinalysis On File:     Component Value Date/Time   COLORURINE STRAW (A) 10/29/2021 0030   APPEARANCEUR CLEAR (A) 10/29/2021 0030   LABSPEC 1.005 10/29/2021 0030   PHURINE 5.0 10/29/2021 0030   GLUCOSEU NEGATIVE 10/29/2021 0030   HGBUR NEGATIVE 10/29/2021 0030   BILIRUBINUR  NEGATIVE 10/29/2021 0030   KETONESUR NEGATIVE 10/29/2021 0030   PROTEINUR NEGATIVE 10/29/2021 0030   NITRITE NEGATIVE 10/29/2021 0030   LEUKOCYTESUR NEGATIVE 10/29/2021 0030   Sepsis Labs: @LABRCNTIP (procalcitonin:4,lacticidven:4) Microbiology: Recent Results (from the past 240 hours)  Resp panel by RT-PCR (RSV, Flu A&B, Covid) Anterior Nasal Swab     Status: None   Collection Time: 07/11/23  1:53 PM   Specimen: Anterior Nasal Swab  Result Value Ref Range Status   SARS Coronavirus 2 by RT PCR NEGATIVE NEGATIVE Final    Comment: (NOTE) SARS-CoV-2 target nucleic acids are NOT DETECTED.  The SARS-CoV-2 RNA is generally detectable in upper respiratory specimens during the acute phase of infection. The lowest concentration of SARS-CoV-2 viral copies this assay can detect is 138 copies/mL. A negative result does not preclude SARS-Cov-2 infection and should not be used as the sole basis for treatment or other patient management decisions. A negative result may occur with  improper specimen collection/handling, submission of specimen other than nasopharyngeal swab, presence of viral mutation(s) within the areas targeted by this assay, and inadequate number of viral copies(<138 copies/mL). A negative result must be combined with clinical observations, patient history, and epidemiological information. The expected result is Negative.  Fact Sheet for Patients:  BloggerCourse.com  Fact Sheet for Healthcare Providers:  SeriousBroker.it  This test is no t yet approved or cleared by the [place]  FDA and  has been authorized for detection and/or diagnosis of SARS-CoV-2 by FDA under an Emergency Use Authorization (EUA). This EUA will remain  in effect (meaning this test can be used) for the duration of the COVID-19 declaration under Section 564(b)(1) of the Act, 21 U.S.C.section 360bbb-3(b)(1), unless the authorization is terminated  or  revoked sooner.       Influenza A by PCR NEGATIVE NEGATIVE Final   Influenza B by PCR NEGATIVE NEGATIVE Final    Comment: (NOTE) The Xpert Xpress SARS-CoV-2/FLU/RSV plus assay is intended as an aid in the diagnosis of influenza from Nasopharyngeal swab specimens and should not be used as a sole basis for treatment. Nasal washings and aspirates are unacceptable for Xpert Xpress SARS-CoV-2/FLU/RSV testing.  Fact Sheet for Patients: BloggerCourse.com  Fact Sheet for Healthcare Providers: SeriousBroker.it  This test is not yet approved or cleared by the [place]  FDA and has been authorized for detection and/or diagnosis of SARS-CoV-2 by FDA under an Emergency Use Authorization (EUA). This EUA will remain in effect (meaning this test can be used) for the duration of the COVID-19 declaration under Section 564(b)(1) of the Act, 21 U.S.C. section 360bbb-3(b)(1), unless the authorization is terminated or revoked.     Resp Syncytial Virus by PCR NEGATIVE NEGATIVE Final    Comment: (NOTE) Fact Sheet for Patients: BloggerCourse.com  Fact Sheet for Healthcare Providers: SeriousBroker.it  This test is not yet approved or cleared by the [place]  FDA and has been authorized for detection and/or diagnosis of SARS-CoV-2 by FDA under an Emergency Use Authorization (EUA). This EUA will remain in effect (meaning this test can be used) for the duration of the COVID-19 declaration under Section 564(b)(1) of the Act, 21 U.S.C. section 360bbb-3(b)(1), unless the authorization is terminated or revoked.  Performed at [facility], [address] [place], [place] [address]       Radiology Studies last 3 days: No results found.        Cayson Kalb, DO [facility] 07/21/2023, 11:49 AM    Dictation software may have been used to generate the above  note. Typos may occur and escape review in typed/dictated notes. Please contact Dr Authur Leghorn directly for clarity if needed.  Staff may message me via secure chat in Epic  but this may not receive an immediate response,  please page me for urgent matters!  If 7PM-7AM, please contact night coverage [email or web address]

## 2023-07-22 DIAGNOSIS — J9621 Acute and chronic respiratory failure with hypoxia: Secondary | ICD-10-CM | POA: Diagnosis not present

## 2023-07-22 LAB — BASIC METABOLIC PANEL WITH GFR
Anion gap: 9 (ref 5–15)
BUN: 78 mg/dL — ABNORMAL HIGH (ref 8–23)
CO2: 24 mmol/L (ref 22–32)
Calcium: 8.6 mg/dL — ABNORMAL LOW (ref 8.9–10.3)
Chloride: 98 mmol/L (ref 98–111)
Creatinine, Ser: 3.52 mg/dL — ABNORMAL HIGH (ref 0.61–1.24)
GFR, Estimated: 18 mL/min — ABNORMAL LOW (ref >60)
Glucose, Bld: 109 mg/dL — ABNORMAL HIGH (ref 70–99)
Potassium: 3.6 mmol/L (ref 3.5–5.1)
Sodium: 131 mmol/L — ABNORMAL LOW (ref 135–145)

## 2023-07-22 LAB — CREATININE, URINE, 24 HOUR
Collection Interval-UCRE24: 24 h
Creatinine, 24H Ur: 975 mg/d (ref 800–2000)
Creatinine, Urine: 150 mg/dL
Urine Total Volume-UCRE24: 650 mL

## 2023-07-22 MED ORDER — FUROSEMIDE 10 MG/ML IJ SOLN
80.0000 mg | Freq: Once | INTRAMUSCULAR | Status: AC
  Administered 2023-07-22: 80 mg via INTRAVENOUS
  Filled 2023-07-22: qty 8

## 2023-07-22 NOTE — Progress Notes (Signed)
 [facility]  ROUNDING NOTE   Subjective:  [name] was admitted to [facility] on 06/21/2023 for shortness of breath with suspect of CHF vs COPD exacerbation. Patient reports increase weight gain of 13lbs and states his concern for repeat of previous CHF exacerbation.   Patient is followed by Dr Zelda Hickman outpatient.   Update:  Patient seen laying in bed Appears uncomfortable Reports intermittent shortness of breath  24-hour urine creatinine clearance in progress at the time of visit.  Creatinine 3.52   Objective:  Vital signs in last 24 hours:  Temp:  [97.7 F (36.5 C)-98 F (36.7 C)] 97.8 F (36.6 C) (05/06 0857) Pulse Rate:  [56-61] 56 (05/06 0857) Resp:  [18] 18 (05/05 1557) BP: (141-165)/(68-82) 159/76 (05/06 0857) SpO2:  [92 %-98 %] 98 % (05/06 0857) Weight:  [82.8 kg] 82.8 kg (05/06 0500)  Weight change: -0.118 kg Filed Weights   07/20/23 0521 07/21/23 0500 07/22/23 0500  Weight: 83.1 kg 82.9 kg 82.8 kg    Intake/Output: I/O last 3 completed shifts: In: 1320 [P.O.:1320] Out: 950 [Urine:750; Blood:200]   Intake/Output this shift:  Total I/O In: 240 [P.O.:240] Out: -   Physical Exam: General: NAD, restless  Head: Normocephalic, atraumatic. Moist oral mucosal membranes  Eyes: Anicteric  Lungs:  3L 02, crackles  Heart: Regular rate and rhythm  Abdomen:  Soft, nontender  Extremities:  No peripheral edema.  Neurologic: Alert, moving all four extremities  Skin: No lesions  Access: None    Basic Metabolic Panel: Recent Labs  Lab 07/17/23 0344 07/18/23 0423 07/19/23 0438 07/20/23 0427 07/21/23 0439  NA 134* 131* 132* 133* 134*  K 3.4* 3.3* 3.5 3.4* 3.5  CL 97* 95* 95* 95* 98  CO2 24 25 25 25 22   GLUCOSE 125* 111* 140* 110* 98  BUN 111* 82* 57* 73* 76*  CREATININE 3.57* 3.01* 2.64* 2.95* 3.43*  CALCIUM  8.6* 8.5* 8.3* 8.6* 8.7*  PHOS 5.9*  --   --  3.9  --     Liver Function Tests: No results for input(s): "AST", "ALT",  "ALKPHOS", "BILITOT", "PROT", "ALBUMIN" in the last 168 hours. No results for input(s): "LIPASE", "AMYLASE" in the last 168 hours. No results for input(s): "AMMONIA" in the last 168 hours.  CBC: Recent Labs  Lab 07/17/23 0759 07/18/23 0951  WBC 11.8* 14.5*  HGB 11.6* 11.7*  HCT 32.8* 34.1*  MCV 85.0 86.5  PLT 142* 141*    Cardiac Enzymes: No results for input(s): "CKTOTAL", "CKMB", "CKMBINDEX", "TROPONINI" in the last 168 hours.  BNP: Invalid input(s): "POCBNP"  CBG: Recent Labs  Lab 07/16/23 0801  GLUCAP 112*    Microbiology: Results for orders placed or performed during the hospital encounter of 07/11/23  Blood culture (routine x 2)     Status: None   Collection Time: 07/11/23 10:45 AM   Specimen: BLOOD RIGHT FOREARM  Result Value Ref Range Status   Specimen Description BLOOD RIGHT FOREARM  Final   Special Requests   Final    BOTTLES DRAWN AEROBIC AND ANAEROBIC Blood Culture results may not be optimal due to an inadequate volume of blood received in culture bottles   Culture   Final    NO GROWTH 5 DAYS Performed at [facility], [address] [place], [place] [address]    Report Status 07/16/2023 FINAL  Final  Blood culture (routine x 2)     Status: None   Collection Time: 07/11/23 10:45 AM   Specimen: BLOOD LEFT  FOREARM  Result Value Ref Range Status   Specimen Description BLOOD LEFT FOREARM  Final   Special Requests   Final    BOTTLES DRAWN AEROBIC AND ANAEROBIC Blood Culture adequate volume   Culture   Final    NO GROWTH 5 DAYS Performed at [facility], [address] [place], [place] [address]    Report Status 07/16/2023 FINAL  Final  Resp panel by RT-PCR (RSV, Flu A&B, Covid) Anterior Nasal Swab     Status: None   Collection Time: 07/11/23  1:53 PM   Specimen: Anterior Nasal Swab  Result Value Ref Range Status   SARS Coronavirus 2 by RT PCR NEGATIVE NEGATIVE Final    Comment: (NOTE) SARS-CoV-2 target nucleic acids are NOT  DETECTED.  The SARS-CoV-2 RNA is generally detectable in upper respiratory specimens during the acute phase of infection. The lowest concentration of SARS-CoV-2 viral copies this assay can detect is 138 copies/mL. A negative result does not preclude SARS-Cov-2 infection and should not be used as the sole basis for treatment or other patient management decisions. A negative result may occur with  improper specimen collection/handling, submission of specimen other than nasopharyngeal swab, presence of viral mutation(s) within the areas targeted by this assay, and inadequate number of viral copies(<138 copies/mL). A negative result must be combined with clinical observations, patient history, and epidemiological information. The expected result is Negative.  Fact Sheet for Patients:  BloggerCourse.com  Fact Sheet for Healthcare Providers:  SeriousBroker.it  This test is no t yet approved or cleared by the [place]  FDA and  has been authorized for detection and/or diagnosis of SARS-CoV-2 by FDA under an Emergency Use Authorization (EUA). This EUA will remain  in effect (meaning this test can be used) for the duration of the COVID-19 declaration under Section 564(b)(1) of the Act, 21 U.S.C.section 360bbb-3(b)(1), unless the authorization is terminated  or revoked sooner.       Influenza A by PCR NEGATIVE NEGATIVE Final   Influenza B by PCR NEGATIVE NEGATIVE Final    Comment: (NOTE) The Xpert Xpress SARS-CoV-2/FLU/RSV plus assay is intended as an aid in the diagnosis of influenza from Nasopharyngeal swab specimens and should not be used as a sole basis for treatment. Nasal washings and aspirates are unacceptable for Xpert Xpress SARS-CoV-2/FLU/RSV testing.  Fact Sheet for Patients: BloggerCourse.com  Fact Sheet for Healthcare Providers: SeriousBroker.it  This test is not yet  approved or cleared by the [place]  FDA and has been authorized for detection and/or diagnosis of SARS-CoV-2 by FDA under an Emergency Use Authorization (EUA). This EUA will remain in effect (meaning this test can be used) for the duration of the COVID-19 declaration under Section 564(b)(1) of the Act, 21 U.S.C. section 360bbb-3(b)(1), unless the authorization is terminated or revoked.     Resp Syncytial Virus by PCR NEGATIVE NEGATIVE Final    Comment: (NOTE) Fact Sheet for Patients: BloggerCourse.com  Fact Sheet for Healthcare Providers: SeriousBroker.it  This test is not yet approved or cleared by the [place]  FDA and has been authorized for detection and/or diagnosis of SARS-CoV-2 by FDA under an Emergency Use Authorization (EUA). This EUA will remain in effect (meaning this test can be used) for the duration of the COVID-19 declaration under Section 564(b)(1) of the Act, 21 U.S.C. section 360bbb-3(b)(1), unless the authorization is terminated or revoked.  Performed at [facility], [address] [place], [place] [address]     Coagulation Studies: No results for input(s): "LABPROT", "INR"  in the last 72 hours.   Urinalysis: No results for input(s): "COLORURINE", "LABSPEC", "PHURINE", "GLUCOSEU", "HGBUR", "BILIRUBINUR", "KETONESUR", "PROTEINUR", "UROBILINOGEN", "NITRITE", "LEUKOCYTESUR" in the last 72 hours.  Invalid input(s): "APPERANCEUR"    Imaging: No results found.     Medications:      allopurinol   100 mg Oral Daily   amLODipine   5 mg Oral BID   apixaban   2.5 mg Oral BID   carvedilol   25 mg Oral BID WC   Chlorhexidine  Gluconate Cloth  6 each Topical Q0600   cholecalciferol   1,000 Units Oral Daily   cloNIDine   0.1 mg Oral BID   cyanocobalamin   1,000 mcg Oral Daily   hydrALAZINE   100 mg Oral TID   Ipratropium-Albuterol   1 puff Inhalation Q6H   isosorbide  dinitrate  40 mg Oral TID    pantoprazole   40 mg Oral Daily   predniSONE   20 mg Oral Q breakfast   rosuvastatin   20 mg Oral Daily   sodium chloride  flush  3 mL Intravenous Q12H   acetaminophen , albuterol , bisacodyl , lidocaine , lidocaine -EPINEPHrine  (PF), ondansetron  (ZOFRAN ) IV, mouth rinse, sodium chloride  flush  Assessment/ Plan:  Mr. Keyon Sada. is a 73 y.o.  male with PMH of HFrEF,CAD, PVD, CVA, COPD, stage IV CKD who is admitted to [facility] on 06/21/2022 for COPD exacerbation (HCC) [J44.1] COPD with acute exacerbation (HCC) [J44.1] Stage 4 chronic kidney disease (HCC) [N18.4] Cardiorenal syndrome [I13.10]  Acute Kidney Injury on chronic kidney disease stage IV 2/2 to acute cardiorenal syndrome: baseline creatinine of 3.54, GFR of 18. 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 Renal ultrasound shows atrophic right kidney.  Creatinine has returned to baseline, prior to receiving dialysis.  Last dialysis treatment received on Friday.  Awaiting 24-hour urine creatinine clearance to determine need for further dialysis, concerned that with shortness of breath, patient may need to continue dialysis at this time.  If needed, we will consult vascular for placement of PermCath.  Lab Results  Component Value Date   CREATININE 3.43 (H) 07/21/2023   CREATININE 2.95 (H) 07/20/2023   CREATININE 2.64 (H) 07/19/2023     Intake/Output Summary (Last 24 hours) at 07/22/2023 1244 Last data filed at 07/22/2023 1100 Gross per 24 hour  Intake 720 ml  Output 400 ml  Net 320 ml      Acute Respiratory distress 2/2 Acute COPD vs Acute exacerbation of chronic systolic and diastolic congestive heart failure.   -06/18/22 ECHO LV EF 50%-60%. Repeat ECHO pending results. Chest CT ordered - Patient currently on 3 L nasal cannula - After 24-hour urine completes, will order IV furosemide  80 mg once. -May indicate further dialysis is needed. .   3. Anemia with chronic kidney disease:  HgB 11.7 at last check    Hypertension with chronic kidney disease. Currently on 40 mg isosorbide  dinitrate, metoprolol  25 mg daily, hydralazine  100 mg TID, amlodipine  5 mg, carvedilol  12.5 mg BID, and clonidine   Blood pressure 159/76  Secondary Hyperparathyroidism  Ca 8.6, Phos, PTH 124 Calcium  and phosphorus within optimal range.    LOS: 10 Pailynn Vahey 5/6/202512:44 PM

## 2023-07-22 NOTE — Progress Notes (Signed)
 PROGRESS NOTE    Gary Mccoy.   ZOX:[id number] DOB: 10-25-1950  DOA: 07/11/2023 Date of Service: 07/22/23 which is hospital day 10  PCP: Gary Cunning, MD   Hospital course / significant events:   HPI: 73yo with h/o HFrEF, CAD/PAD, carotid stenosis, stage IV CKD, COPD, HLD, and CVA who presented on 4/25 with SOB.    04/25: admitted to hospitalist w/ resp fail requiring BiPap, d/t COPD and HFrEF exacerbations.  04/26: on [place] O2, Nephrology consult for cardiorenal syndrome / advancing CKD. Started lasix  drip. Echo done.  04/27: continue lasix  drip 04/28: lasix  drip d/c  04/29: no immediate dialysis need, monitoring off lasix   04/30: tempcath placement, dialysis was started today 05/01-05/02: dialysis daily 05/03-05/04: holding HD for now, Cr up through the weekend 05/05: permcath placement was tentatively planned for today but nephrology opts to do 24h Cr clearance and hold off on permath/HD for now  05/06: awaiting 24-hour urine creatinine clearance, some SOB so plan for lasix  IV 80 mg x1 per nephro      Consultants:  Nephrology  Vascular surgery   Procedures/Surgeries: 07/16/23 R femoral temporary dialysis catheter placed      ASSESSMENT & PLAN:   Acute hypoxic respiratory failure Due to acute on chronic heart failure +/- bronchitis/COPD exacerbation  Required BiPAP on presentation, weaned to  O2 O2 support as needed  Treat underlying cause(s)   Cardiorenal syndrome complicated by stage IV CKD question progression to ESRD  Patient's renal dysfunction appears to be stable, but he appears unable to effectively balance CHF associated with volume overload, not improved with Lasix  Nephrology following  Monitor BMP, 24h CrCl to determine further dialysis  Acute on chronic combined CHF Echo with EF 45-50%, global hypokinesis, moderate to severe LV dilatation, grade 1 DD IV Lasix  --> Lasix  infusion stopped 4/28 --> off diuresis and monitoring renal fxn   awaiting 24-hour urine creatinine clearance prior to dose lasix  IV 80 mg x1 per nephro  Strict I&O Beta blocker w/ carvedilol  No ASA on Eliquis  Statin     HLD CAD hx percutaneous coronary angioplasty HTN Amlodipine , carvedilol , clonidine , hydralazine  Isosrobide Crestor      COPD w/ questionable exacerbation given shortness of breath and cough but SOB worsened despite Levaquin and steroids x 4 days PTA, more likely d/t CHF  Combivent  prednisone  taper Albuterol  q2h prn No current wheezing noted   History of CVA and bilateral carotid artery stenosis (cerebrovascular accident) History of CVA with carotid artery stenosis, as well as extensive PAD PCP recently stopped Plavix  Continue Eliquis , Crestor   Hx gout Allopurinol  renal dose  GERD PPI  No concerns based on BMI: Body mass index is 24.25 kg/m.[name] Significantly low or high BMI is associated with higher medical risk.  Underweight - under 18  overweight - 25 to 29 obese - 30 or more Class 1 obesity: BMI of 30.0 to 34 Class 2 obesity: BMI of 35.0 to 39 Class 3 obesity: BMI of 40.0 to 49 Super Morbid Obesity: BMI 50-59 Super-super Morbid Obesity: BMI 60+ Healthy nutrition and physical activity advised as adjunct to other disease management and risk reduction treatments    DVT prophylaxis: heparin  IV fluids: no continuous IV fluids  Nutrition: cardiac/carb Central lines / other devices: tempcath for HD  Code Status: FULL CODE ACP documentation reviewed: none on file in [facility]  TOC needs: TBD Medical barriers to dispo: pending 24h creatinine clearance to determine need for further dialysis. Expected medical readiness for discharge several more days /  may need outpatietn HD set up              Subjective / Brief ROS:  Patient reports feels "okay" this morning Denies CP/SOB at rest w/ me butnephrology notes some SOB today Pain controlled.  Denies new weakness.    Family Communication: none at bedside      Objective Findings:  Vitals:   07/21/23 2020 07/22/23 0402 07/22/23 0500 07/22/23 0857  BP: (!) 141/77 (!) 159/82  (!) 159/76  Pulse: (!) 59 61  (!) 56  Resp:      Temp: 97.7 F (36.5 C) 98 F (36.7 C)  97.8 F (36.6 C)  TempSrc: Oral Oral  Oral  SpO2: 92% 97%  98%  Weight:   82.8 kg   Height:        Intake/Output Summary (Last 24 hours) at 07/22/2023 1434 Last data filed at 07/22/2023 1100 Gross per 24 hour  Intake 480 ml  Output 250 ml  Net 230 ml   Filed Weights   07/20/23 0521 07/21/23 0500 07/22/23 0500  Weight: 83.1 kg 82.9 kg 82.8 kg    Examination:  Physical Exam Constitutional:      General: He is not in acute distress. Cardiovascular:     Rate and Rhythm: Normal rate and regular rhythm.  Pulmonary:     Effort: Pulmonary effort is normal. No respiratory distress.  Skin:    General: Skin is warm and dry.  Neurological:     General: No focal deficit present.     Mental Status: He is alert and oriented to person, place, and time. Mental status is at baseline.  Psychiatric:        Behavior: Behavior normal.          Scheduled Medications:   allopurinol   100 mg Oral Daily   amLODipine   5 mg Oral BID   apixaban   2.5 mg Oral BID   carvedilol   25 mg Oral BID WC   Chlorhexidine  Gluconate Cloth  6 each Topical Q0600   cholecalciferol   1,000 Units Oral Daily   cloNIDine   0.1 mg Oral BID   cyanocobalamin   1,000 mcg Oral Daily   hydrALAZINE   100 mg Oral TID   Ipratropium-Albuterol   1 puff Inhalation Q6H   isosorbide  dinitrate  40 mg Oral TID   pantoprazole   40 mg Oral Daily   predniSONE   20 mg Oral Q breakfast   rosuvastatin   20 mg Oral Daily   sodium chloride  flush  3 mL Intravenous Q12H    Continuous Infusions:   PRN Medications:  acetaminophen , albuterol , bisacodyl , lidocaine , lidocaine -EPINEPHrine  (PF), ondansetron  (ZOFRAN ) IV, mouth rinse, sodium chloride  flush  Antimicrobials from admission:  Anti-infectives (From admission, onward)     None           Data Reviewed:  I have personally reviewed the following...  CBC: Recent Labs  Lab 07/17/23 0759 07/18/23 0951  WBC 11.8* 14.5*  HGB 11.6* 11.7*  HCT 32.8* 34.1*  MCV 85.0 86.5  PLT 142* 141*   Basic Metabolic Panel: Recent Labs  Lab 07/17/23 0344 07/18/23 0423 07/19/23 0438 07/20/23 0427 07/21/23 0439 07/22/23 1212  NA 134* 131* 132* 133* 134* 131*  K 3.4* 3.3* 3.5 3.4* 3.5 3.6  CL 97* 95* 95* 95* 98 98  CO2 24 25 25 25 22 24   GLUCOSE 125* 111* 140* 110* 98 109*  BUN 111* 82* 57* 73* 76* 78*  CREATININE 3.57* 3.01* 2.64* 2.95* 3.43* 3.52*  CALCIUM  8.6* 8.5* 8.3* 8.6*  8.7* 8.6*  PHOS 5.9*  --   --  3.9  --   --    GFR: Estimated Creatinine Clearance: 21.8 mL/min (A) (by C-G formula based on SCr of 3.52 mg/dL (H)). Liver Function Tests: No results for input(s): "AST", "ALT", "ALKPHOS", "BILITOT", "PROT", "ALBUMIN" in the last 168 hours. No results for input(s): "LIPASE", "AMYLASE" in the last 168 hours. No results for input(s): "AMMONIA" in the last 168 hours. Coagulation Profile: No results for input(s): "INR", "PROTIME" in the last 168 hours.  Cardiac Enzymes: No results for input(s): "CKTOTAL", "CKMB", "CKMBINDEX", "TROPONINI" in the last 168 hours. BNP (last 3 results) No results for input(s): "PROBNP" in the last 8760 hours. HbA1C: No results for input(s): "HGBA1C" in the last 72 hours. CBG: Recent Labs  Lab 07/16/23 0801  GLUCAP 112*   Lipid Profile: No results for input(s): "CHOL", "HDL", "LDLCALC", "TRIG", "CHOLHDL", "LDLDIRECT" in the last 72 hours. Thyroid  Function Tests: No results for input(s): "TSH", "T4TOTAL", "FREET4", "T3FREE", "THYROIDAB" in the last 72 hours. Anemia Panel: No results for input(s): "VITAMINB12", "FOLATE", "FERRITIN", "TIBC", "IRON", "RETICCTPCT" in the last 72 hours. Most Recent Urinalysis On File:     Component Value Date/Time   COLORURINE STRAW (A) 10/29/2021 0030   APPEARANCEUR CLEAR (A)  10/29/2021 0030   LABSPEC 1.005 10/29/2021 0030   PHURINE 5.0 10/29/2021 0030   GLUCOSEU NEGATIVE 10/29/2021 0030   HGBUR NEGATIVE 10/29/2021 0030   BILIRUBINUR NEGATIVE 10/29/2021 0030   KETONESUR NEGATIVE 10/29/2021 0030   PROTEINUR NEGATIVE 10/29/2021 0030   NITRITE NEGATIVE 10/29/2021 0030   LEUKOCYTESUR NEGATIVE 10/29/2021 0030   Sepsis Labs: @LABRCNTIP (procalcitonin:4,lacticidven:4) Microbiology: No results found for this or any previous visit (from the past 240 hours).     Radiology Studies last 3 days: No results found.        Annaleah Arata, DO [facility] 07/22/2023, 2:34 PM    Dictation software may have been used to generate the above note. Typos may occur and escape review in typed/dictated notes. Please contact Dr Authur Leghorn directly for clarity if needed.  Staff may message me via secure chat in Epic  but this may not receive an immediate response,  please page me for urgent matters!  If 7PM-7AM, please contact night coverage [email or web address]

## 2023-07-23 DIAGNOSIS — J9621 Acute and chronic respiratory failure with hypoxia: Secondary | ICD-10-CM | POA: Diagnosis not present

## 2023-07-23 LAB — BASIC METABOLIC PANEL WITH GFR
Anion gap: 11 (ref 5–15)
BUN: 82 mg/dL — ABNORMAL HIGH (ref 8–23)
CO2: 23 mmol/L (ref 22–32)
Calcium: 8.5 mg/dL — ABNORMAL LOW (ref 8.9–10.3)
Chloride: 97 mmol/L — ABNORMAL LOW (ref 98–111)
Creatinine, Ser: 3.34 mg/dL — ABNORMAL HIGH (ref 0.61–1.24)
GFR, Estimated: 19 mL/min — ABNORMAL LOW (ref >60)
Glucose, Bld: 123 mg/dL — ABNORMAL HIGH (ref 70–99)
Potassium: 3 mmol/L — ABNORMAL LOW (ref 3.5–5.1)
Sodium: 131 mmol/L — ABNORMAL LOW (ref 135–145)

## 2023-07-23 LAB — CREATININE CLEARANCE, URINE, 24 HOUR
Collection Interval-CRCL: 24 h
Creatinine Clearance: 19 mL/min — ABNORMAL LOW (ref 75–125)
Creatinine, 24H Ur: 988 mg/d (ref 800–2000)
Creatinine, Urine: 152 mg/dL
Urine Total Volume-CRCL: 650 mL

## 2023-07-23 MED ORDER — HEPARIN SODIUM (PORCINE) 1000 UNIT/ML DIALYSIS: 1000.0000 [IU] | INTRAMUSCULAR | Status: DC | PRN

## 2023-07-23 MED ORDER — ALTEPLASE 2 MG IJ SOLR: 2.0000 mg | Freq: Once | INTRAMUSCULAR | Status: DC | PRN

## 2023-07-23 NOTE — Plan of Care (Signed)
  Problem: Coping: Goal: Level of anxiety will decrease Outcome: Progressing   Problem: Elimination: Goal: Will not experience complications related to bowel motility Outcome: Progressing   Problem: Pain Managment: Goal: General experience of comfort will improve and/or be controlled Outcome: Progressing

## 2023-07-23 NOTE — Progress Notes (Signed)
 [facility]  ROUNDING NOTE   Subjective:  [name] was admitted to [facility] on 06/21/2023 for shortness of breath with suspect of CHF vs COPD exacerbation. Patient reports increase weight gain of 13lbs and states his concern for repeat of previous CHF exacerbation.   Patient is followed by Dr Zelda Hickman outpatient.   Update:  Patient sitting up in bed Awaiting breakfast tray Denies pain or discomfort Denies shortness of breath   Creatinine 3.34 Urine output in past 24 hours.    Objective:  Vital signs in last 24 hours:  Temp:  [97.6 F (36.4 C)-98.5 F (36.9 C)] 98.5 F (36.9 C) (05/07 0838) Pulse Rate:  [54-60] 54 (05/07 0838) Resp:  [18-19] 19 (05/07 0838) BP: (143-162)/(65-73) 153/67 (05/07 0838) SpO2:  [94 %-98 %] 94 % (05/07 0838) Weight:  [83.2 kg] 83.2 kg (05/07 0558)  Weight change: 0.4 kg Filed Weights   07/21/23 0500 07/22/23 0500 07/23/23 0558  Weight: 82.9 kg 82.8 kg 83.2 kg    Intake/Output: I/O last 3 completed shifts: In: 720 [P.O.:720] Out: 1650 [Urine:1650]   Intake/Output this shift:  No intake/output data recorded.  Physical Exam: General: NAD, restless  Head: Normocephalic, atraumatic. Moist oral mucosal membranes  Eyes: Anicteric  Lungs:  3L 02, crackles  Heart: Regular rate and rhythm  Abdomen:  Soft, nontender  Extremities:  No peripheral edema.  Neurologic: Alert, moving all four extremities  Skin: No lesions  Access: None    Basic Metabolic Panel: Recent Labs  Lab 07/17/23 0344 07/18/23 0423 07/19/23 0438 07/20/23 0427 07/21/23 0439 07/22/23 1212 07/23/23 0540  NA 134*   < > 132* 133* 134* 131* 131*  K 3.4*   < > 3.5 3.4* 3.5 3.6 3.0*  CL 97*   < > 95* 95* 98 98 97*  CO2 24   < > 25 25 22 24 23   GLUCOSE 125*   < > 140* 110* 98 109* 123*  BUN 111*   < > 57* 73* 76* 78* 82*  CREATININE 3.57*   < > 2.64* 2.95* 3.43* 3.52* 3.34*  CALCIUM  8.6*   < > 8.3* 8.6* 8.7* 8.6* 8.5*  PHOS 5.9*  --   --   3.9  --   --   --    < > = values in this interval not displayed.    Liver Function Tests: No results for input(s): "AST", "ALT", "ALKPHOS", "BILITOT", "PROT", "ALBUMIN" in the last 168 hours. No results for input(s): "LIPASE", "AMYLASE" in the last 168 hours. No results for input(s): "AMMONIA" in the last 168 hours.  CBC: Recent Labs  Lab 07/17/23 0759 07/18/23 0951  WBC 11.8* 14.5*  HGB 11.6* 11.7*  HCT 32.8* 34.1*  MCV 85.0 86.5  PLT 142* 141*    Cardiac Enzymes: No results for input(s): "CKTOTAL", "CKMB", "CKMBINDEX", "TROPONINI" in the last 168 hours.  BNP: Invalid input(s): "POCBNP"  CBG: No results for input(s): "GLUCAP" in the last 168 hours.   Microbiology: Results for orders placed or performed during the hospital encounter of 07/11/23  Blood culture (routine x 2)     Status: None   Collection Time: 07/11/23 10:45 AM   Specimen: BLOOD RIGHT FOREARM  Result Value Ref Range Status   Specimen Description BLOOD RIGHT FOREARM  Final   Special Requests   Final    BOTTLES DRAWN AEROBIC AND ANAEROBIC Blood Culture results may not be optimal due to an inadequate volume of blood received in culture bottles  Culture   Final    NO GROWTH 5 DAYS Performed at [facility], [address] [place] [place], [place] [address]    Report Status 07/16/2023 FINAL  Final  Blood culture (routine x 2)     Status: None   Collection Time: 07/11/23 10:45 AM   Specimen: BLOOD LEFT FOREARM  Result Value Ref Range Status   Specimen Description BLOOD LEFT FOREARM  Final   Special Requests   Final    BOTTLES DRAWN AEROBIC AND ANAEROBIC Blood Culture adequate volume   Culture   Final    NO GROWTH 5 DAYS Performed at [facility], [address] [place], [place] [address]    Report Status 07/16/2023 FINAL  Final  Resp panel by RT-PCR (RSV, Flu A&B, Covid) Anterior Nasal Swab     Status: None   Collection Time: 07/11/23  1:53 PM   Specimen: Anterior Nasal Swab  Result  Value Ref Range Status   SARS Coronavirus 2 by RT PCR NEGATIVE NEGATIVE Final    Comment: (NOTE) SARS-CoV-2 target nucleic acids are NOT DETECTED.  The SARS-CoV-2 RNA is generally detectable in upper respiratory specimens during the acute phase of infection. The lowest concentration of SARS-CoV-2 viral copies this assay can detect is 138 copies/mL. A negative result does not preclude SARS-Cov-2 infection and should not be used as the sole basis for treatment or other patient management decisions. A negative result may occur with  improper specimen collection/handling, submission of specimen other than nasopharyngeal swab, presence of viral mutation(s) within the areas targeted by this assay, and inadequate number of viral copies(<138 copies/mL). A negative result must be combined with clinical observations, patient history, and epidemiological information. The expected result is Negative.  Fact Sheet for Patients:  BloggerCourse.com  Fact Sheet for Healthcare Providers:  SeriousBroker.it  This test is no t yet approved or cleared by the [place]  FDA and  has been authorized for detection and/or diagnosis of SARS-CoV-2 by FDA under an Emergency Use Authorization (EUA). This EUA will remain  in effect (meaning this test can be used) for the duration of the COVID-19 declaration under Section 564(b)(1) of the Act, 21 U.S.C.section 360bbb-3(b)(1), unless the authorization is terminated  or revoked sooner.       Influenza A by PCR NEGATIVE NEGATIVE Final   Influenza B by PCR NEGATIVE NEGATIVE Final    Comment: (NOTE) The Xpert Xpress SARS-CoV-2/FLU/RSV plus assay is intended as an aid in the diagnosis of influenza from Nasopharyngeal swab specimens and should not be used as a sole basis for treatment. Nasal washings and aspirates are unacceptable for Xpert Xpress SARS-CoV-2/FLU/RSV testing.  Fact Sheet for  Patients: BloggerCourse.com  Fact Sheet for Healthcare Providers: SeriousBroker.it  This test is not yet approved or cleared by the [place]  FDA and has been authorized for detection and/or diagnosis of SARS-CoV-2 by FDA under an Emergency Use Authorization (EUA). This EUA will remain in effect (meaning this test can be used) for the duration of the COVID-19 declaration under Section 564(b)(1) of the Act, 21 U.S.C. section 360bbb-3(b)(1), unless the authorization is terminated or revoked.     Resp Syncytial Virus by PCR NEGATIVE NEGATIVE Final    Comment: (NOTE) Fact Sheet for Patients: BloggerCourse.com  Fact Sheet for Healthcare Providers: SeriousBroker.it  This test is not yet approved or cleared by the [place]  FDA and has been authorized for detection and/or diagnosis of SARS-CoV-2 by FDA under an Emergency Use Authorization (EUA). This EUA will remain in  effect (meaning this test can be used) for the duration of the COVID-19 declaration under Section 564(b)(1) of the Act, 21 U.S.C. section 360bbb-3(b)(1), unless the authorization is terminated or revoked.  Performed at [facility], [address] [place], [place] [address]     Coagulation Studies: No results for input(s): "LABPROT", "INR" in the last 72 hours.   Urinalysis: No results for input(s): "COLORURINE", "LABSPEC", "PHURINE", "GLUCOSEU", "HGBUR", "BILIRUBINUR", "KETONESUR", "PROTEINUR", "UROBILINOGEN", "NITRITE", "LEUKOCYTESUR" in the last 72 hours.  Invalid input(s): "APPERANCEUR"    Imaging: No results found.     Medications:      allopurinol   100 mg Oral Daily   amLODipine   5 mg Oral BID   apixaban   2.5 mg Oral BID   carvedilol   25 mg Oral BID WC   Chlorhexidine  Gluconate Cloth  6 each Topical Q0600   cholecalciferol   1,000 Units Oral Daily   cloNIDine   0.1 mg Oral BID    cyanocobalamin   1,000 mcg Oral Daily   hydrALAZINE   100 mg Oral TID   Ipratropium-Albuterol   1 puff Inhalation Q6H   isosorbide  dinitrate  40 mg Oral TID   pantoprazole   40 mg Oral Daily   predniSONE   20 mg Oral Q breakfast   rosuvastatin   20 mg Oral Daily   sodium chloride  flush  3 mL Intravenous Q12H   acetaminophen , albuterol , alteplase , bisacodyl , heparin , lidocaine , lidocaine -EPINEPHrine  (PF), ondansetron  (ZOFRAN ) IV, mouth rinse, sodium chloride  flush  Assessment/ Plan:  [name] is a 73 y.o.  male with PMH of HFrEF,CAD, PVD, CVA, COPD, stage IV CKD who is admitted to [facility] on 06/21/2022 for COPD exacerbation (HCC) [J44.1] COPD with acute exacerbation (HCC) [J44.1] Stage 4 chronic kidney disease (HCC) [N18.4] Cardiorenal syndrome [I13.10]  Acute Kidney Injury on chronic kidney disease stage IV 2/2 to acute cardiorenal syndrome: baseline creatinine of 3.54, GFR of 18. 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 Renal ultrasound shows atrophic right kidney.  Creatinine has shown some spontaneous recovery with increased urine output. Will place hold on permcath placement and monitor for renal recovery. Patient encouraged to hydrate. Nursing staff encouraged to document all urine output  Lab Results  Component Value Date   CREATININE 3.34 (H) 07/23/2023   CREATININE 3.52 (H) 07/22/2023   CREATININE 3.43 (H) 07/21/2023     Intake/Output Summary (Last 24 hours) at 07/23/2023 1419 Last data filed at 07/23/2023 0332 Gross per 24 hour  Intake 240 ml  Output 1450 ml  Net -1210 ml      Acute Respiratory distress 2/2 Acute COPD vs Acute exacerbation of chronic systolic and diastolic congestive heart failure.   -06/18/22 ECHO LV EF 50%-60%. Repeat ECHO pending results. Chest CT ordered - Remains on 3L [place]. - Denies shortness of breath.  .   3. Anemia with chronic kidney disease:  HgB 11.7 at last check   Hypertension with chronic kidney disease. Currently  on 40 mg isosorbide  dinitrate, metoprolol  25 mg daily, hydralazine  100 mg TID, amlodipine  5 mg, carvedilol  12.5 mg BID, and clonidine   Blood pressure 153/67, within optimal range for this patient.   Secondary Hyperparathyroidism  Ca 8.6, Phos, PTH 124 Will continue to monitor.    LOS: 11 Gary Mccoy 5/7/20252:19 PM

## 2023-07-23 NOTE — Progress Notes (Signed)
 Occupational Therapy Treatment Patient Details Name: Gary Mccoy. MRN: [id number] DOB: 09-Nov-1950 Today's Date: 07/23/2023   History of present illness Gary Mccoy. is a 72yoM, PMH: HFpEF, CAD, PAD, CAS, COPD, CKD-IV, HLD, CVA, who presents to the ED due to shortness of breath. Pt was placed on BIPAP and weaned to 3L [place] O2, likely associated with CHF + COPD.   OT comments  Pt seen for OT tx this date, received sitting EOB. Pt agreeable to session focusing on functional mobility with balance challenges and ADL tasks. On 2L O2 via [place] upon arrival, SpO2 97%. Increased to 3L for mobility efforts, SpO2 90%, increased to 4L with SpO2 93%>. Performs functional mobility with stops, direction changes and head turns, no LOB noted, supervision level 300 + ft with OT managing O2. SOB noted upon exertion, OT led standing recovery breaks to monitor O2 4x throughout mobility. Goals updated, pt progressing well. Discharge recommendation remains appropriate.       If plan is discharge home, recommend the following:  Assistance with cooking/housework   Equipment Recommendations  None recommended by OT       Precautions / Restrictions Precautions Precautions: Fall Recall of Precautions/Restrictions: Intact Restrictions Weight Bearing Restrictions Per Provider Order: No       Mobility Bed Mobility Overal bed mobility: Modified Independent                  Transfers Overall transfer level: Independent                       Balance Overall balance assessment: No apparent balance deficits (not formally assessed)                             High Level Balance Comments: 300 ft in hallway working on balance, pt without LOB, able to dual-task and negotiate environment, navigates unit and finds way back to room without difficulties           ADL either performed or assessed with clinical judgement   ADL Overall ADL's : At baseline                              Toileting- Clothing Manipulation and Hygiene: Independent Toileting - Clothing Manipulation Details (indicate cue type and reason): manages clothing and urinal for void in standing     Functional mobility during ADLs: Supervision/safety General ADL Comments: Distant supervision for toileting needs, functional mobility 300 ft in hallway no AD with OT managing O2 tank     Communication Communication Communication: No apparent difficulties   Cognition Arousal: Alert Behavior During Therapy: WFL for tasks assessed/performed Cognition: No apparent impairments                               Following commands: Intact Following commands impaired: Follows multi-step commands with increased time      Cueing   Cueing Techniques: Verbal cues        General Comments On 2L O2 via [place] upon arrival, SpO2 97%. Increased to 3L for mobility efforts, SpO2 90%, increased to 4L with SpO2 93%>. Left back on 2L with SpO2 95%.    Pertinent Vitals/ Pain       Pain Assessment Pain Assessment: Faces Faces Pain Scale: Hurts little more Pain Location: hip pain Pain Descriptors /  Indicators: Sore Pain Intervention(s): Limited activity within patient's tolerance         Frequency  Min 1X/week        Progress Toward Goals  OT Goals(current goals can now be found in the care plan section)  Progress towards OT goals: Goals updated  ADL Goals Pt Will Perform Grooming: Independently;standing Pt Will Perform Lower Body Dressing: Independently;sitting/lateral leans Pt Will Transfer to Toilet: Independently;ambulating;regular height toilet Pt Will Perform Toileting - Clothing Manipulation and hygiene: Independently  Plan         AM-PAC OT "6 Clicks" Daily Activity     Outcome Measure   Help from another person eating meals?: None Help from another person taking care of personal grooming?: None Help from another person toileting, which includes using toliet,  bedpan, or urinal?: None Help from another person bathing (including washing, rinsing, drying)?: A Little Help from another person to put on and taking off regular upper body clothing?: None Help from another person to put on and taking off regular lower body clothing?: None 6 Click Score: 23    End of Session Equipment Utilized During Treatment: Oxygen;Gait belt  OT Visit Diagnosis: Unsteadiness on feet (R26.81);Other abnormalities of gait and mobility (R26.89);Repeated falls (R29.6);Muscle weakness (generalized) (M62.81)   Activity Tolerance Patient tolerated treatment well   Patient Left in bed;with call bell/phone within reach   Nurse Communication Mobility status        Time: 9604-5409 OT Time Calculation (min): 16 min  Charges: OT General Charges $OT Visit: 1 Visit OT Treatments $Self Care/Home Management : 8-22 mins  Emberly Tomasso L. Kordell Jafri, OTR/L  07/23/23, 12:47 PM

## 2023-07-23 NOTE — Progress Notes (Signed)
 Physical Therapy Treatment Patient Details Name: Gary Mccoy. MRN: [id number] DOB: [birth year] Today's Date: 07/23/2023   History of Present Illness Gary Mccoy. is a 72yoM, PMH: HFpEF, CAD, PAD, CAS, COPD, CKD-IV, HLD, CVA, who presents to the ED due to shortness of breath. Pt was placed on BIPAP and weaned to 3L [name] O2, likely associated with CHF + COPD.    PT Comments  Patient alert, agreeable to mobilize and eager to move once up. Pt did ambulate in room independently, but took off . Spo2 readings mid to low 80s on room air. In fact needed 4L to maintain spO2 88% or greater throughout mobility. Pt able to ambulate >287ft, supervision (PT led rest breaks for SOB and to assess spO2). No LOB noted, and sitting EOB at end of session on 2L. Pt verbose throughout session and frustrated with current condition and hospitalization. RN notified of pt status and return to 2L sitting EOB, agreeable to check in a few minutes after allowing pt to rest. PT to assess goals next session and continued needs of skilled therapy, pt is mobilizing well overall.     If plan is discharge home, recommend the following: A little help with walking and/or transfers;A little help with bathing/dressing/bathroom;Assistance with cooking/housework;Assist for transportation;Help with stairs or ramp for entrance;Direct supervision/assist for medications management   Can travel by private vehicle        Equipment Recommendations  None recommended by PT    Recommendations for Other Services       Precautions / Restrictions Precautions Precautions: Fall Recall of Precautions/Restrictions: Intact Precaution/Restrictions Comments: he denies any falls in last year, but appears impulsive. Restrictions Weight Bearing Restrictions Per Provider Order: No     Mobility  Bed Mobility Overal bed mobility: Modified Independent                  Transfers Overall transfer level: Independent Equipment  used: None Transfers: Sit to/from Stand                  Ambulation/Gait Ambulation/Gait assistance: Supervision Gait Distance (Feet):  (>210 feet in total but many rest breaks initiated by PT to watch O2)           General Gait Details: improved steadiness over time, but some SOB noted. pt placed on 4L to maintain spO2 >88%   Stairs             Wheelchair Mobility     Tilt Bed    Modified Rankin (Stroke Patients Only)       Balance Overall balance assessment: No apparent balance deficits (not formally assessed)                                          Communication    Cognition Arousal: Alert Behavior During Therapy: WFL for tasks assessed/performed   PT - Cognitive impairments: No apparent impairments                       PT - Cognition Comments: frustated by current state but oriented, some questionable safety decisions (doffed O2 immediately, ready to ambulate without) Following commands: Intact      Cueing    Exercises      General Comments        Pertinent Vitals/Pain Pain Assessment Pain Assessment: 0-10 Pain Score: 6  Pain Location:  back pain Pain Descriptors / Indicators: Sore Pain Intervention(s): Limited activity within patient's tolerance, Repositioned, Monitored during session    Home Living                          Prior Function            PT Goals (current goals can now be found in the care plan section) Progress towards PT goals: Progressing toward goals    Frequency    Min 1X/week      PT Plan      Co-evaluation              AM-PAC PT "6 Clicks" Mobility   Outcome Measure  Help needed turning from your back to your side while in a flat bed without using bedrails?: None Help needed moving from lying on your back to sitting on the side of a flat bed without using bedrails?: None Help needed moving to and from a bed to a chair (including a wheelchair)?:  None Help needed standing up from a chair using your arms (e.g., wheelchair or bedside chair)?: None Help needed to walk in hospital room?: None Help needed climbing 3-5 steps with a railing? : None 6 Click Score: 24    End of Session Equipment Utilized During Treatment: Oxygen Activity Tolerance: Patient tolerated treatment well Patient left: with call bell/phone within reach (seated EOB) Nurse Communication: Mobility status (O2 needs) PT Visit Diagnosis: Unsteadiness on feet (R26.81);Muscle weakness (generalized) (M62.81)     Time: 1610-9604 PT Time Calculation (min) (ACUTE ONLY): 16 min  Charges:    $Therapeutic Activity: 8-22 mins PT General Charges $$ ACUTE PT VISIT: 1 Visit                     Darien Eden PT, DPT 10:30 AM,07/23/23

## 2023-07-23 NOTE — Progress Notes (Signed)
 Progress Note    Taytum A Eber Goldsmith.  BJY:[id number] DOB: Oct 19, 1950  DOA: 07/11/2023 PCP: Sari Cunning, MD      Brief Narrative:    Medical records reviewed and are as summarized below:    Hospital course / significant events:   HPI: 73yo with h/o HFrEF, CAD/PAD, carotid stenosis, stage IV CKD, COPD, HLD, and CVA who presented on 4/25 with SOB.    04/25: admitted to hospitalist w/ resp fail requiring BiPap, d/t COPD and HFrEF exacerbations.  04/26: on [place] O2, Nephrology consult for cardiorenal syndrome / advancing CKD. Started lasix  drip. Echo done.  04/27: continue lasix  drip 04/28: lasix  drip d/c  04/29: no immediate dialysis need, monitoring off lasix   04/30: tempcath placement, dialysis was started today 05/01-05/02: dialysis daily 05/03-05/04: holding HD for now, Cr up through the weekend 05/05: permcath placement was tentatively planned for today but nephrology opts to do 24h Cr clearance and hold off on permath/HD for now  05/06: awaiting 24-hour urine creatinine clearance, some SOB so plan for lasix  IV 80 mg x1 per nephro         Assessment/Plan:   Principal Problem:   Acute on chronic respiratory failure with hypoxia (HCC) Active Problems:   Acute on chronic systolic CHF (congestive heart failure) (HCC)   COPD with acute exacerbation (HCC)   Benign essential HTN   CKD (chronic kidney disease) stage 4, GFR 15-29 ml/min (HCC)   History of CVA and bilateral carotid artery stenosis (cerebrovascular accident)   Chronic anticoagulation   CAD S/P percutaneous coronary angioplasty   Cardiorenal syndrome    Body mass index is 24.2 kg/m.   Acute hypoxic respiratory failure Due to acute on chronic heart failure +/- bronchitis/COPD exacerbation  Required BiPAP on presentation, weaned to [place] O2 O2 support as needed.  Patient has used home oxygen in the past.   Cardiorenal syndrome complicated by stage IV CKD question progression to ESRD  Patient's  renal dysfunction appears to be stable 24-hour creatinine clearance estimated at 19 but urine output was only 650 mL. Plan to place permacatheter on hold for now per nephrologist.   Acute on chronic combined CHF Echo with EF 45-50%, global hypokinesis, moderate to severe LV dilatation, grade 1 DD IV Lasix  --> Lasix  infusion stopped 4/28 --> off diuresis and monitoring renal fxn  Strict I&O Beta blocker w/ carvedilol  No ASA.  On Eliquis  Statin      HLD CAD hx percutaneous coronary angioplasty HTN Amlodipine , carvedilol , clonidine , hydralazine  Isosrobide Crestor       COPD w/ questionable exacerbation given shortness of breath and cough but SOB worsened despite Levaquin and steroids x 4 days PTA, more likely d/t CHF  Bronchodilators as needed Discontinue Prednisone     History of CVA and bilateral carotid artery stenosis (cerebrovascular accident) History of CVA with carotid artery stenosis, as well as extensive PAD PCP recently stopped Plavix  Continue Eliquis , Crestor    Hx gout Allopurinol  renal dose   GERD PPI     Diet Order             Diet renal with fluid restriction Fluid restriction: 1500 mL Fluid; Fluid consistency: Thin  Diet effective now                            Consultants: Nephrologist Vascular surgeon  Procedures: 07/16/23 R femoral temporary dialysis catheter placed    Medications:    allopurinol   100 mg Oral  Daily   amLODipine   5 mg Oral BID   apixaban   2.5 mg Oral BID   carvedilol   25 mg Oral BID WC   Chlorhexidine  Gluconate Cloth  6 each Topical Q0600   cholecalciferol   1,000 Units Oral Daily   cloNIDine   0.1 mg Oral BID   cyanocobalamin   1,000 mcg Oral Daily   hydrALAZINE   100 mg Oral TID   Ipratropium-Albuterol   1 puff Inhalation Q6H   isosorbide  dinitrate  40 mg Oral TID   pantoprazole   40 mg Oral Daily   predniSONE   20 mg Oral Q breakfast   rosuvastatin   20 mg Oral Daily   sodium chloride  flush  3 mL  Intravenous Q12H   Continuous Infusions:   Anti-infectives (From admission, onward)    None              Family Communication/Anticipated D/C date and plan/Code Status   DVT prophylaxis: apixaban  (ELIQUIS ) tablet 2.5 mg Start: 07/11/23 2200 apixaban  (ELIQUIS ) tablet 2.5 mg     Code Status: Full Code  Family Communication: None Disposition Plan: Plan to discharge home   Status is: Inpatient Remains inpatient appropriate because: CHF exacerbation, AKI       Subjective:   Interval events noted.  No new complaints.  He feels his belly is still distended.  No leg swelling or shortness of breath.  Objective:    Vitals:   07/22/23 1708 07/22/23 2108 07/23/23 0558 07/23/23 0838  BP: (!) 143/65 (!) 158/73 (!) 156/67 (!) 153/67  Pulse: (!) 55 60 (!) 54 (!) 54  Resp: 19 18 18 19   Temp: 97.7 F (36.5 C) 97.6 F (36.4 C) 98.1 F (36.7 C) 98.5 F (36.9 C)  TempSrc: Oral Oral Oral Oral  SpO2: 96% 96% 95% 94%  Weight:   83.2 kg   Height:       No data found.   Intake/Output Summary (Last 24 hours) at 07/23/2023 1541 Last data filed at 07/23/2023 0332 Gross per 24 hour  Intake --  Output 1450 ml  Net -1450 ml   Filed Weights   07/21/23 0500 07/22/23 0500 07/23/23 0558  Weight: 82.9 kg 82.8 kg 83.2 kg    Exam:  GEN: NAD SKIN: Warm and dry EYES: No pallor or icterus ENT: MMM CV: RRR PULM: Bilateral rhonchi ABD: soft, distended, NT, +BS CNS: AAO x 3, non focal EXT: No edema or tenderness        Data Reviewed:   I have personally reviewed following labs and imaging studies:  Labs: Labs show the following:   Basic Metabolic Panel: Recent Labs  Lab 07/17/23 0344 07/18/23 0423 07/19/23 0438 07/20/23 0427 07/21/23 0439 07/22/23 1212 07/23/23 0540  NA 134*   < > 132* 133* 134* 131* 131*  K 3.4*   < > 3.5 3.4* 3.5 3.6 3.0*  CL 97*   < > 95* 95* 98 98 97*  CO2 24   < > 25 25 22 24 23   GLUCOSE 125*   < > 140* 110* 98 109* 123*  BUN 111*    < > 57* 73* 76* 78* 82*  CREATININE 3.57*   < > 2.64* 2.95* 3.43* 3.52* 3.34*  CALCIUM  8.6*   < > 8.3* 8.6* 8.7* 8.6* 8.5*  PHOS 5.9*  --   --  3.9  --   --   --    < > = values in this interval not displayed.   GFR Estimated Creatinine Clearance: 22.9 mL/min (A) (by C-G  formula based on SCr of 3.34 mg/dL (H)). Liver Function Tests: No results for input(s): "AST", "ALT", "ALKPHOS", "BILITOT", "PROT", "ALBUMIN" in the last 168 hours. No results for input(s): "LIPASE", "AMYLASE" in the last 168 hours. No results for input(s): "AMMONIA" in the last 168 hours. Coagulation profile No results for input(s): "INR", "PROTIME" in the last 168 hours.  CBC: Recent Labs  Lab 07/17/23 0759 07/18/23 0951  WBC 11.8* 14.5*  HGB 11.6* 11.7*  HCT 32.8* 34.1*  MCV 85.0 86.5  PLT 142* 141*   Cardiac Enzymes: No results for input(s): "CKTOTAL", "CKMB", "CKMBINDEX", "TROPONINI" in the last 168 hours. BNP (last 3 results) No results for input(s): "PROBNP" in the last 8760 hours. CBG: No results for input(s): "GLUCAP" in the last 168 hours. D-Dimer: No results for input(s): "DDIMER" in the last 72 hours. Hgb A1c: No results for input(s): "HGBA1C" in the last 72 hours. Lipid Profile: No results for input(s): "CHOL", "HDL", "LDLCALC", "TRIG", "CHOLHDL", "LDLDIRECT" in the last 72 hours. Thyroid  function studies: No results for input(s): "TSH", "T4TOTAL", "T3FREE", "THYROIDAB" in the last 72 hours.  Invalid input(s): "FREET3" Anemia work up: No results for input(s): "VITAMINB12", "FOLATE", "FERRITIN", "TIBC", "IRON", "RETICCTPCT" in the last 72 hours. Sepsis Labs: Recent Labs  Lab 07/17/23 0759 07/18/23 0951  WBC 11.8* 14.5*    Microbiology No results found for this or any previous visit (from the past 240 hours).  Procedures and diagnostic studies:  No results found.             LOS: 11 days   Khalessi Blough  [employer] loss adjuster on [email or web address]. If 7PM-7AM,  please contact night-coverage at [email or web address]     07/23/2023, 3:41 PM

## 2023-07-24 ENCOUNTER — Other Ambulatory Visit: Payer: Self-pay

## 2023-07-24 DIAGNOSIS — J9621 Acute and chronic respiratory failure with hypoxia: Secondary | ICD-10-CM | POA: Diagnosis not present

## 2023-07-24 LAB — BASIC METABOLIC PANEL WITH GFR
Anion gap: 12 (ref 5–15)
BUN: 76 mg/dL — ABNORMAL HIGH (ref 8–23)
CO2: 23 mmol/L (ref 22–32)
Calcium: 8.5 mg/dL — ABNORMAL LOW (ref 8.9–10.3)
Chloride: 95 mmol/L — ABNORMAL LOW (ref 98–111)
Creatinine, Ser: 3.19 mg/dL — ABNORMAL HIGH (ref 0.61–1.24)
GFR, Estimated: 20 mL/min — ABNORMAL LOW (ref >60)
Glucose, Bld: 132 mg/dL — ABNORMAL HIGH (ref 70–99)
Potassium: 3.5 mmol/L (ref 3.5–5.1)
Sodium: 130 mmol/L — ABNORMAL LOW (ref 135–145)

## 2023-07-24 MED ORDER — POTASSIUM CHLORIDE CRYS ER 20 MEQ PO TBCR: 20.0000 meq | EXTENDED_RELEASE_TABLET | Freq: Every day | ORAL | Status: DC

## 2023-07-24 MED ORDER — POTASSIUM CHLORIDE CRYS ER 20 MEQ PO TBCR
40.0000 meq | EXTENDED_RELEASE_TABLET | Freq: Once | ORAL | Status: AC
  Administered 2023-07-24: 40 meq via ORAL
  Filled 2023-07-24: qty 2

## 2023-07-24 MED ORDER — CARVEDILOL 25 MG PO TABS
25.0000 mg | ORAL_TABLET | Freq: Two times a day (BID) | ORAL | 0 refills | Status: DC
  Filled 2023-07-24: qty 60, 30d supply, fill #0

## 2023-07-24 NOTE — Progress Notes (Signed)
 [facility]  ROUNDING NOTE   Subjective:  [name] was admitted to [facility] on 06/21/2023 for shortness of breath with suspect of CHF vs COPD exacerbation. Patient reports increase weight gain of 13lbs and states his concern for repeat of previous CHF exacerbation.   Patient is followed by Dr Zelda Hickman outpatient.   Update:  Patient sitting up in bed Room air States he feels well, not at baseline but better  Creatinine 3.19    Objective:  Vital signs in last 24 hours:  Temp:  [97.7 F (36.5 C)-98 F (36.7 C)] 98 F (36.7 C) (05/08 0818) Pulse Rate:  [51-59] 54 (05/08 0818) Resp:  [16-19] 16 (05/08 0818) BP: (149-162)/(61-77) 162/71 (05/08 0818) SpO2:  [93 %-95 %] 94 % (05/08 0818) Weight:  [75.7 kg] 75.7 kg (05/08 0343)  Weight change: -7.5 kg Filed Weights   07/22/23 0500 07/23/23 0558 07/24/23 0343  Weight: 82.8 kg 83.2 kg 75.7 kg    Intake/Output: I/O last 3 completed shifts: In: -  Out: 1620 [Urine:1620]   Intake/Output this shift:  No intake/output data recorded.  Physical Exam: General: NAD, restless  Head: Normocephalic, atraumatic. Moist oral mucosal membranes  Eyes: Anicteric  Lungs:  Clear to auscultation, room air  Heart: Regular rate and rhythm  Abdomen:  Soft, nontender  Extremities:  No peripheral edema.  Neurologic: Alert, moving all four extremities  Skin: No lesions  Access: None    Basic Metabolic Panel: Recent Labs  Lab 07/20/23 0427 07/21/23 0439 07/22/23 1212 07/23/23 0540 07/24/23 1114  NA 133* 134* 131* 131* 130*  K 3.4* 3.5 3.6 3.0* 3.5  CL 95* 98 98 97* 95*  CO2 25 22 24 23 23   GLUCOSE 110* 98 109* 123* 132*  BUN 73* 76* 78* 82* 76*  CREATININE 2.95* 3.43* 3.52* 3.34* 3.19*  CALCIUM  8.6* 8.7* 8.6* 8.5* 8.5*  PHOS 3.9  --   --   --   --     Liver Function Tests: No results for input(s): "AST", "ALT", "ALKPHOS", "BILITOT", "PROT", "ALBUMIN" in the last 168 hours. No results for input(s): "LIPASE",  "AMYLASE" in the last 168 hours. No results for input(s): "AMMONIA" in the last 168 hours.  CBC: Recent Labs  Lab 07/18/23 0951  WBC 14.5*  HGB 11.7*  HCT 34.1*  MCV 86.5  PLT 141*    Cardiac Enzymes: No results for input(s): "CKTOTAL", "CKMB", "CKMBINDEX", "TROPONINI" in the last 168 hours.  BNP: Invalid input(s): "POCBNP"  CBG: No results for input(s): "GLUCAP" in the last 168 hours.   Microbiology: Results for orders placed or performed during the hospital encounter of 07/11/23  Blood culture (routine x 2)     Status: None   Collection Time: 07/11/23 10:45 AM   Specimen: BLOOD RIGHT FOREARM  Result Value Ref Range Status   Specimen Description BLOOD RIGHT FOREARM  Final   Special Requests   Final    BOTTLES DRAWN AEROBIC AND ANAEROBIC Blood Culture results may not be optimal due to an inadequate volume of blood received in culture bottles   Culture   Final    NO GROWTH 5 DAYS Performed at [facility], [address], [place], [place] [address]    Report Status 07/16/2023 FINAL  Final  Blood culture (routine x 2)     Status: None   Collection Time: 07/11/23 10:45 AM   Specimen: BLOOD LEFT FOREARM  Result Value Ref Range Status   Specimen Description BLOOD LEFT FOREARM  Final   Special Requests   Final    BOTTLES DRAWN AEROBIC AND ANAEROBIC Blood Culture adequate volume   Culture   Final    NO GROWTH 5 DAYS Performed at [facility], [address] [place], [place] [address]    Report Status 07/16/2023 FINAL  Final  Resp panel by RT-PCR (RSV, Flu A&B, Covid) Anterior Nasal Swab     Status: None   Collection Time: 07/11/23  1:53 PM   Specimen: Anterior Nasal Swab  Result Value Ref Range Status   SARS Coronavirus 2 by RT PCR NEGATIVE NEGATIVE Final    Comment: (NOTE) SARS-CoV-2 target nucleic acids are NOT DETECTED.  The SARS-CoV-2 RNA is generally detectable in upper respiratory specimens during the acute phase of infection. The  lowest concentration of SARS-CoV-2 viral copies this assay can detect is 138 copies/mL. A negative result does not preclude SARS-Cov-2 infection and should not be used as the sole basis for treatment or other patient management decisions. A negative result may occur with  improper specimen collection/handling, submission of specimen other than nasopharyngeal swab, presence of viral mutation(s) within the areas targeted by this assay, and inadequate number of viral copies(<138 copies/mL). A negative result must be combined with clinical observations, patient history, and epidemiological information. The expected result is Negative.  Fact Sheet for Patients:  BloggerCourse.com  Fact Sheet for Healthcare Providers:  SeriousBroker.it  This test is no t yet approved or cleared by the [place]  FDA and  has been authorized for detection and/or diagnosis of SARS-CoV-2 by FDA under an Emergency Use Authorization (EUA). This EUA will remain  in effect (meaning this test can be used) for the duration of the COVID-19 declaration under Section 564(b)(1) of the Act, 21 U.S.C.section 360bbb-3(b)(1), unless the authorization is terminated  or revoked sooner.       Influenza A by PCR NEGATIVE NEGATIVE Final   Influenza B by PCR NEGATIVE NEGATIVE Final    Comment: (NOTE) The Xpert Xpress SARS-CoV-2/FLU/RSV plus assay is intended as an aid in the diagnosis of influenza from Nasopharyngeal swab specimens and should not be used as a sole basis for treatment. Nasal washings and aspirates are unacceptable for Xpert Xpress SARS-CoV-2/FLU/RSV testing.  Fact Sheet for Patients: BloggerCourse.com  Fact Sheet for Healthcare Providers: SeriousBroker.it  This test is not yet approved or cleared by the [place]  FDA and has been authorized for detection and/or diagnosis of SARS-CoV-2 by FDA under  an Emergency Use Authorization (EUA). This EUA will remain in effect (meaning this test can be used) for the duration of the COVID-19 declaration under Section 564(b)(1) of the Act, 21 U.S.C. section 360bbb-3(b)(1), unless the authorization is terminated or revoked.     Resp Syncytial Virus by PCR NEGATIVE NEGATIVE Final    Comment: (NOTE) Fact Sheet for Patients: BloggerCourse.com  Fact Sheet for Healthcare Providers: SeriousBroker.it  This test is not yet approved or cleared by the [place]  FDA and has been authorized for detection and/or diagnosis of SARS-CoV-2 by FDA under an Emergency Use Authorization (EUA). This EUA will remain in effect (meaning this test can be used) for the duration of the COVID-19 declaration under Section 564(b)(1) of the Act, 21 U.S.C. section 360bbb-3(b)(1), unless the authorization is terminated or revoked.  Performed at [facility], [address] [place], [place] [address]     Coagulation Studies: No results for input(s): "LABPROT", "INR" in the last 72 hours.   Urinalysis: No results for input(s): "COLORURINE", "LABSPEC", "PHURINE", "  GLUCOSEU", "HGBUR", "BILIRUBINUR", "KETONESUR", "PROTEINUR", "UROBILINOGEN", "NITRITE", "LEUKOCYTESUR" in the last 72 hours.  Invalid input(s): "APPERANCEUR"    Imaging: No results found.     Medications:      allopurinol   100 mg Oral Daily   amLODipine   5 mg Oral BID   apixaban   2.5 mg Oral BID   carvedilol   25 mg Oral BID WC   Chlorhexidine  Gluconate Cloth  6 each Topical Q0600   cholecalciferol   1,000 Units Oral Daily   cloNIDine   0.1 mg Oral BID   cyanocobalamin   1,000 mcg Oral Daily   hydrALAZINE   100 mg Oral TID   Ipratropium-Albuterol   1 puff Inhalation Q6H   isosorbide  dinitrate  40 mg Oral TID   pantoprazole   40 mg Oral Daily   rosuvastatin   20 mg Oral Daily   sodium chloride  flush  3 mL Intravenous Q12H    acetaminophen , albuterol , alteplase , bisacodyl , heparin , lidocaine , lidocaine -EPINEPHrine  (PF), ondansetron  (ZOFRAN ) IV, mouth rinse, sodium chloride  flush  Assessment/ Plan:  Mr. Ranaldo Barasch. is a 73 y.o.  male with PMH of HFrEF,CAD, PVD, CVA, COPD, stage IV CKD who is admitted to [facility] on 06/21/2022 for COPD exacerbation (HCC) [J44.1] COPD with acute exacerbation (HCC) [J44.1] Stage 4 chronic kidney disease (HCC) [N18.4] Cardiorenal syndrome [I13.10]  Acute Kidney Injury on chronic kidney disease stage IV 2/2 to acute cardiorenal syndrome: baseline creatinine of 3.54, GFR of 18. 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 Renal ultrasound shows atrophic right kidney.  Creatinine continues to improve without dialysis. Urine output appears decreased but patient states he missed a couple collections. We feel patient is in renal recovery and hold any further dialysis. We will schedule follow up appt with Dr Zelda Hickman at discharge.   Lab Results  Component Value Date   CREATININE 3.19 (H) 07/24/2023   CREATININE 3.34 (H) 07/23/2023   CREATININE 3.52 (H) 07/22/2023     Intake/Output Summary (Last 24 hours) at 07/24/2023 1146 Last data filed at 07/23/2023 1945 Gross per 24 hour  Intake --  Output 620 ml  Net -620 ml      Acute Respiratory distress 2/2 Acute COPD vs Acute exacerbation of chronic systolic and diastolic congestive heart failure.   -06/18/22 ECHO LV EF 50%-60%. Repeat ECHO pending results. Chest CT ordered - Weaned to room air   .   3. Anemia with chronic kidney disease:  HgB 11.7 at last check   Hypertension with chronic kidney disease. Currently on 40 mg isosorbide  dinitrate, metoprolol  25 mg daily, hydralazine  100 mg TID, amlodipine  5 mg, carvedilol  12.5 mg BID, and clonidine   Blood pressure with mild elevation at times.   Secondary Hyperparathyroidism  Ca 8.6, Phos, PTH 124 Will continue to monitor.    LOS: 12 [place] Picking 5/8/202511:46 AM

## 2023-07-24 NOTE — Plan of Care (Signed)

## 2023-07-24 NOTE — Discharge Summary (Addendum)
 Physician Discharge Summary   Patient: Gary Mccoy. MRN: [id number] DOB: 05/21/1950  Admit date:     07/11/2023  Discharge date: 07/24/23  Discharge Physician: Sheril Dines   PCP: Sari Cunning, MD   Recommendations at discharge:   Follow-up with Dr. Zelda Hickman, nephrologist, within 1 week of discharge  Discharge Diagnoses: Principal Problem:   Acute on chronic respiratory failure with hypoxia (HCC) Active Problems:   Acute on chronic diastolic CHF (congestive heart failure) (HCC)   COPD with acute exacerbation (HCC)   Benign essential HTN   CKD (chronic kidney disease) stage 4, GFR 15-29 ml/min (HCC)   History of CVA and bilateral carotid artery stenosis (cerebrovascular accident)   Chronic anticoagulation   CAD S/P percutaneous coronary angioplasty   Cardiorenal syndrome  Resolved Problems:   * No resolved hospital problems. [facility] Course:    Hospital course / significant events:    HPI: 73yo with h/o HFrEF, CAD/PAD, carotid stenosis, stage IV CKD, COPD, HLD, and CVA who presented on 4/25 with SOB.     04/25: admitted to hospitalist w/ resp fail requiring BiPap, d/t COPD and HFrEF exacerbations.  04/26: on [place] O2, Nephrology consult for cardiorenal syndrome / advancing CKD. Started lasix  drip. Echo done.  04/27: continue lasix  drip 04/28: lasix  drip d/c  04/29: no immediate dialysis need, monitoring off lasix   04/30: tempcath placement, dialysis was started today 05/01-05/02: dialysis daily 05/03-05/04: holding HD for now, Cr up through the weekend 05/05: permcath placement was tentatively planned for today but nephrology opts to do 24h Cr clearance and hold off on permath/HD for now  05/06: awaiting 24-hour urine creatinine clearance, some SOB so plan for lasix  IV 80 mg x1 per nephro       Assessment and Plan:   Acute hypoxic respiratory failure: Resolved Due to acute on chronic heart failure +/- bronchitis/COPD exacerbation  Required BiPAP on  presentation, weaned to [place] O2.  He is now tolerating room air. Patient has used home oxygen in the past.     Cardiorenal syndrome complicated by stage IV CKD question progression to ESRD  Patient's renal dysfunction appears to be stable 24-hour creatinine clearance estimated at 19 but urine output was only 650 mL. No plan for hemodialysis at this time. Case discussed with nephrologist.  Patient is okay for discharge and will follow-up with Dr. Zelda Hickman, his nephrologist, as an outpatient.   Dr. Rhesa Celeste recommended holding diuretics for now.     Acute on chronic diastolic CHF Echo with midrange EF 45-50%, global hypokinesis, moderate to severe LV dilatation, grade 1 DD IV Lasix  --> Lasix  infusion stopped 4/28 --> off diuresis and monitoring renal fxn  Continue carvedilol  On Eliquis  Statin       HLD CAD hx percutaneous coronary angioplasty HTN Amlodipine , carvedilol , clonidine , hydralazine  Isosrobide dinitrate Crestor        COPD w/ questionable exacerbation Bronchodilators as needed Completed course of prednisone  antibiotics (Levaquin).     History of CVA and bilateral carotid artery stenosis (cerebrovascular accident) History of CVA with carotid artery stenosis, as well as extensive PAD PCP recently stopped Plavix  Continue Eliquis , Crestor      Hx gout Allopurinol  renal dose     GERD PPI     His condition has improved and he is deemed stable for discharge to home today.  Discharge plan was discussed with his wife at the bedside.        Consultants: Nephrologist, vascular surgeon Procedures performed: Right femoral temporary dialysis catheter placed  on 07/16/2023 Disposition: Home Diet recommendation:  Discharge Diet Orders (From admission, onward)     Start     Ordered   07/24/23 0000  Diet renal with fluid restriction        07/24/23 1244           Renal diet DISCHARGE MEDICATION: Allergies as of 07/24/2023   No Known Allergies      Medication List      STOP taking these medications    clopidogrel  75 MG tablet Commonly known as: PLAVIX    furosemide  40 MG tablet Commonly known as: LASIX    isosorbide  mononitrate 60 MG 24 hr tablet Commonly known as: IMDUR    metolazone 5 MG tablet Commonly known as: ZAROXOLYN   metoprolol  succinate 25 MG 24 hr tablet Commonly known as: TOPROL -XL   spironolactone 25 MG tablet Commonly known as: ALDACTONE       TAKE these medications    acetaminophen  500 MG tablet Commonly known as: TYLENOL  Take 500 mg by mouth every 6 (six) hours as needed.   albuterol  108 (90 Base) MCG/ACT inhaler Commonly known as: VENTOLIN  HFA Inhale 2 puffs into the lungs every 6 (six) hours as needed for wheezing or shortness of breath.   allopurinol  100 MG tablet Commonly known as: ZYLOPRIM  Take 100 mg by mouth daily.   amLODipine  5 MG tablet Commonly known as: NORVASC  Take 5 mg by mouth 2 (two) times daily.   apixaban  2.5 MG Tabs tablet Commonly known as: ELIQUIS  Take 2.5 mg by mouth 2 (two) times daily.   carvedilol  25 MG tablet Commonly known as: COREG  Take 1 tablet (25 mg total) by mouth 2 (two) times daily with a meal. What changed:  medication strength how much to take   cloNIDine  0.1 MG tablet Commonly known as: CATAPRES  Take 0.1 mg by mouth 2 (two) times daily.   CoQ10 200 MG Caps Take 200 mg by mouth daily.   cyanocobalamin  1000 MCG tablet Commonly known as: VITAMIN B12 Take 1,000 mcg by mouth daily.   FISH OIL PO Take by mouth.   hydrALAZINE  100 MG tablet Commonly known as: APRESOLINE  Take 100 mg by mouth 3 (three) times daily.   isosorbide  dinitrate 40 MG tablet Commonly known as: ISORDIL  Take 1 tablet (40 mg total) by mouth 3 (three) times daily.   magnesium  oxide 400 MG tablet Commonly known as: MAG-OX Take 400 mg by mouth daily.   MULTIVITAMIN ADULT PO Take 1 tablet by mouth daily. Men   nitroGLYCERIN  0.4 MG SL tablet Commonly known as: NITROSTAT  Place 1 tablet  (0.4 mg total) under the tongue every 5 (five) minutes x 3 doses as needed for chest pain.   pantoprazole  40 MG tablet Commonly known as: PROTONIX  Take 40 mg by mouth daily.   potassium chloride  SA 20 MEQ tablet Commonly known as: KLOR-CON  M Take 1 tablet (20 mEq total) by mouth daily. What changed: when to take this   rosuvastatin  40 MG tablet Commonly known as: CRESTOR  Take 20 mg by mouth daily.   Vitamin D -1000 Max St 25 MCG (1000 UT) tablet Generic drug: Cholecalciferol  Take 1,000 Units by mouth daily.   Vitron-C 65-125 MG Tabs Generic drug: Iron-Vitamin C Take 1 tablet by mouth daily.        Follow-up Information     Sari Cunning, MD Follow up.   Specialty: Internal Medicine Why: Hospital follow up Contact information: [address] [facility] [place] [address] [phone]  Discharge Exam: Filed Weights   07/22/23 0500 07/23/23 0558 07/24/23 0343  Weight: 82.8 kg 83.2 kg 75.7 kg   GEN: NAD SKIN: Warm and dry EYES: No pallor or icterus ENT: MMM CV: RRR PULM: Mild bilateral rhonchi ABD: soft, mildly distended, NT, +BS CNS: AAO x 3, non focal EXT: No edema or tenderness   Condition at discharge: good  The results of significant diagnostics from this hospitalization (including imaging, microbiology, ancillary and laboratory) are listed below for reference.   Imaging Studies: PERIPHERAL VASCULAR CATHETERIZATION Result Date: 07/16/2023 See surgical note for result.  CT CHEST WO CONTRAST Result Date: 07/13/2023 CLINICAL DATA:  shortness of breath, unclear etiology EXAM: CT CHEST WITHOUT CONTRAST TECHNIQUE: Multidetector CT imaging of the chest was performed following the standard protocol without IV contrast. RADIATION DOSE REDUCTION: This exam was performed according to the departmental dose-optimization program which includes automated exposure control, adjustment of the mA and/or kV according to  patient size and/or use of iterative reconstruction technique. COMPARISON:  07/11/2023 FINDINGS: Cardiovascular: Unenhanced imaging of the heart is unremarkable without pericardial effusion. Normal caliber of the thoracic aorta. Atherosclerosis of the aorta and coronary vasculature. Assessment of the vascular lumen cannot be performed without IV contrast. Mediastinum/Nodes: No enlarged mediastinal or axillary lymph nodes. Thyroid  gland, trachea, and esophagus demonstrate no significant findings. Lungs/Pleura: Trace bilateral pleural effusions, right greater than left. Mild biapical paraseptal emphysema. Bilateral bronchial wall thickening, greatest at the lung bases, compatible with bronchitis or reactive airway disease. No airspace disease or pneumothorax. Upper Abdomen: Small calcified gallstones without evidence of acute cholecystitis. Right renal cortical atrophy. Musculoskeletal: No acute or destructive bony abnormalities. Reconstructed images demonstrate no additional findings. IMPRESSION: 1. Bilateral bronchial wall thickening, greatest at the bases, consistent with bronchitis or reactive airway disease. 2. Trace bilateral pleural effusions. 3. Mild biapical emphysema. 4. Aortic Atherosclerosis (ICD10-I70.0). Coronary artery atherosclerosis. 5. Incidental cholelithiasis without evidence of cholecystitis. Electronically Signed   By: Bobbye Burrow M.D.   On: 07/13/2023 15:22   ECHOCARDIOGRAM COMPLETE Result Date: 07/13/2023    ECHOCARDIOGRAM REPORT   Patient Name:   Gary Mccoy. Date of Exam: 07/12/2023 Medical Rec #:  [id number]             Height:       73.0 in Accession #:    [id number]            Weight:       181.7 lb Date of Birth:  1950/06/19             BSA:          2.065 m Patient Age:    72 years              BP:           171/82 mmHg Patient Gender: M                     HR:           72 bpm. Exam Location:  Inpatient Procedure: 2D Echo (Both Spectral and Color Flow Doppler were utilized  during            procedure). Indications:     acute diastolic chf  History:         Patient has prior history of Echocardiogram examinations, most                  recent 06/18/2022. CAD, PAD, COPD and chronic kidney disease,  Signs/Symptoms:Shortness of Breath; Risk Factors:Current                  Smoker.  Sonographer:     Dione Franks RDCS Referring Phys:  [id number] Avi Body Diagnosing Phys: Antonette Batters MD  Sonographer Comments: Image acquisition challenging due to COPD. IMPRESSIONS  1. Left ventricular ejection fraction, by estimation, is 45 to 50%. The left ventricle has mildly decreased function. The left ventricle demonstrates global hypokinesis. The left ventricular internal cavity size was moderately to severely dilated. Left ventricular diastolic parameters are consistent with Grade I diastolic dysfunction (impaired relaxation).  2. Right ventricular systolic function is normal. The right ventricular size is normal. Mildly increased right ventricular wall thickness.  3. The mitral valve is normal in structure. No evidence of mitral valve regurgitation.  4. The aortic valve is normal in structure. Aortic valve regurgitation is trivial. FINDINGS  Left Ventricle: Left ventricular ejection fraction, by estimation, is 45 to 50%. The left ventricle has mildly decreased function. The left ventricle demonstrates global hypokinesis. Strain was performed and the global longitudinal strain is indeterminate. Global longitudinal strain performed but not reported based on interpreter judgement due to suboptimal tracking. The left ventricular internal cavity size was moderately to severely dilated. There is borderline concentric left ventricular hypertrophy. Left ventricular diastolic parameters are consistent with Grade I diastolic dysfunction (impaired relaxation). Right Ventricle: The right ventricular size is normal. Mildly increased right ventricular wall thickness. Right ventricular  systolic function is normal. Left Atrium: Left atrial size was normal in size. Right Atrium: Right atrial size was normal in size. Pericardium: There is no evidence of pericardial effusion. Mitral Valve: The mitral valve is normal in structure. No evidence of mitral valve regurgitation. Tricuspid Valve: The tricuspid valve is normal in structure. Tricuspid valve regurgitation is trivial. Aortic Valve: The aortic valve is normal in structure. Aortic valve regurgitation is trivial. Pulmonic Valve: The pulmonic valve was normal in structure. Pulmonic valve regurgitation is not visualized. Aorta: The ascending aorta was not well visualized. IAS/Shunts: No atrial level shunt detected by color flow Doppler. Additional Comments: 3D was performed not requiring image post processing on an independent workstation and was indeterminate.  LEFT VENTRICLE PLAX 2D LVIDd:         5.80 cm      Diastology LVIDs:         4.40 cm      LV e' medial:    4.24 cm/s LV PW:         1.00 cm      LV E/e' medial:  15.7 LV IVS:        1.10 cm      LV e' lateral:   6.09 cm/s LVOT diam:     2.30 cm      LV E/e' lateral: 10.9 LV SV:         86 LV SV Index:   42 LVOT Area:     4.15 cm  LV Volumes (MOD) LV vol d, MOD A4C: 113.0 ml LV vol s, MOD A4C: 61.6 ml LV SV MOD A4C:     113.0 ml RIGHT VENTRICLE             IVC RV Basal diam:  3.20 cm     IVC diam: 2.00 cm RV S prime:     13.20 cm/s TAPSE (M-mode): 2.6 cm LEFT ATRIUM             Index  RIGHT ATRIUM           Index LA diam:        4.10 cm 1.99 cm/m   RA Area:     13.70 cm LA Vol (A2C):   76.5 ml 37.04 ml/m  RA Volume:   35.60 ml  17.24 ml/m LA Vol (A4C):   51.0 ml 24.69 ml/m LA Biplane Vol: 65.4 ml 31.66 ml/m  AORTIC VALVE LVOT Vmax:   97.50 cm/s LVOT Vmean:  65.000 cm/s LVOT VTI:    0.207 m  AORTA Ao Root diam: 3.40 cm Ao Asc diam:  2.90 cm MITRAL VALVE MV Area (PHT): 3.72 cm     SHUNTS MV Decel Time: 204 msec     Systemic VTI:  0.21 m MV E velocity: 66.60 cm/s   Systemic Diam:  2.30 cm MV A velocity: 105.00 cm/s MV E/A ratio:  0.63 Dwayne D Callwood MD Electronically signed by Antonette Batters MD Signature Date/Time: 07/13/2023/12:50:00 PM    Final    DG Chest Portable 1 View Result Date: 07/11/2023 CLINICAL DATA:  Dyspnea EXAM: PORTABLE CHEST 1 VIEW COMPARISON:  06/17/2022 chest radiograph. FINDINGS: Stable cardiomediastinal silhouette with normal heart size. No pneumothorax. No pleural effusion. Lungs appear clear, with no acute consolidative airspace disease and no pulmonary edema. IMPRESSION: No active disease. Electronically Signed   By: Levell Reach M.D.   On: 07/11/2023 11:18    Microbiology: Results for orders placed or performed during the hospital encounter of 07/11/23  Blood culture (routine x 2)     Status: None   Collection Time: 07/11/23 10:45 AM   Specimen: BLOOD RIGHT FOREARM  Result Value Ref Range Status   Specimen Description BLOOD RIGHT FOREARM  Final   Special Requests   Final    BOTTLES DRAWN AEROBIC AND ANAEROBIC Blood Culture results may not be optimal due to an inadequate volume of blood received in culture bottles   Culture   Final    NO GROWTH 5 DAYS Performed at [facility], [address], [place], [place] [address]    Report Status 07/16/2023 FINAL  Final  Blood culture (routine x 2)     Status: None   Collection Time: 07/11/23 10:45 AM   Specimen: BLOOD LEFT FOREARM  Result Value Ref Range Status   Specimen Description BLOOD LEFT FOREARM  Final   Special Requests   Final    BOTTLES DRAWN AEROBIC AND ANAEROBIC Blood Culture adequate volume   Culture   Final    NO GROWTH 5 DAYS Performed at [facility], [address] [place], [place] [address]    Report Status 07/16/2023 FINAL  Final  Resp panel by RT-PCR (RSV, Flu A&B, Covid) Anterior Nasal Swab     Status: None   Collection Time: 07/11/23  1:53 PM   Specimen: Anterior Nasal Swab  Result Value Ref Range Status   SARS Coronavirus 2 by RT PCR NEGATIVE  NEGATIVE Final    Comment: (NOTE) SARS-CoV-2 target nucleic acids are NOT DETECTED.  The SARS-CoV-2 RNA is generally detectable in upper respiratory specimens during the acute phase of infection. The lowest concentration of SARS-CoV-2 viral copies this assay can detect is 138 copies/mL. A negative result does not preclude SARS-Cov-2 infection and should not be used as the sole basis for treatment or other patient management decisions. A negative result may occur with  improper specimen collection/handling, submission of specimen other than nasopharyngeal swab, presence of viral mutation(s) within the areas targeted by  this assay, and inadequate number of viral copies(<138 copies/mL). A negative result must be combined with clinical observations, patient history, and epidemiological information. The expected result is Negative.  Fact Sheet for Patients:  BloggerCourse.com  Fact Sheet for Healthcare Providers:  SeriousBroker.it  This test is no t yet approved or cleared by the [place]  FDA and  has been authorized for detection and/or diagnosis of SARS-CoV-2 by FDA under an Emergency Use Authorization (EUA). This EUA will remain  in effect (meaning this test can be used) for the duration of the COVID-19 declaration under Section 564(b)(1) of the Act, 21 U.S.C.section 360bbb-3(b)(1), unless the authorization is terminated  or revoked sooner.       Influenza A by PCR NEGATIVE NEGATIVE Final   Influenza B by PCR NEGATIVE NEGATIVE Final    Comment: (NOTE) The Xpert Xpress SARS-CoV-2/FLU/RSV plus assay is intended as an aid in the diagnosis of influenza from Nasopharyngeal swab specimens and should not be used as a sole basis for treatment. Nasal washings and aspirates are unacceptable for Xpert Xpress SARS-CoV-2/FLU/RSV testing.  Fact Sheet for Patients: BloggerCourse.com  Fact Sheet for Healthcare  Providers: SeriousBroker.it  This test is not yet approved or cleared by the [place]  FDA and has been authorized for detection and/or diagnosis of SARS-CoV-2 by FDA under an Emergency Use Authorization (EUA). This EUA will remain in effect (meaning this test can be used) for the duration of the COVID-19 declaration under Section 564(b)(1) of the Act, 21 U.S.C. section 360bbb-3(b)(1), unless the authorization is terminated or revoked.     Resp Syncytial Virus by PCR NEGATIVE NEGATIVE Final    Comment: (NOTE) Fact Sheet for Patients: BloggerCourse.com  Fact Sheet for Healthcare Providers: SeriousBroker.it  This test is not yet approved or cleared by the [place]  FDA and has been authorized for detection and/or diagnosis of SARS-CoV-2 by FDA under an Emergency Use Authorization (EUA). This EUA will remain in effect (meaning this test can be used) for the duration of the COVID-19 declaration under Section 564(b)(1) of the Act, 21 U.S.C. section 360bbb-3(b)(1), unless the authorization is terminated or revoked.  Performed at [facility], [address] [place], [place] [address]     Labs: CBC: Recent Labs  Lab 07/18/23 0951  WBC 14.5*  HGB 11.7*  HCT 34.1*  MCV 86.5  PLT 141*   Basic Metabolic Panel: Recent Labs  Lab 07/20/23 0427 07/21/23 0439 07/22/23 1212 07/23/23 0540 07/24/23 1114  NA 133* 134* 131* 131* 130*  K 3.4* 3.5 3.6 3.0* 3.5  CL 95* 98 98 97* 95*  CO2 25 22 24 23 23   GLUCOSE 110* 98 109* 123* 132*  BUN 73* 76* 78* 82* 76*  CREATININE 2.95* 3.43* 3.52* 3.34* 3.19*  CALCIUM  8.6* 8.7* 8.6* 8.5* 8.5*  PHOS 3.9  --   --   --   --    Liver Function Tests: No results for input(s): "AST", "ALT", "ALKPHOS", "BILITOT", "PROT", "ALBUMIN" in the last 168 hours. CBG: No results for input(s): "GLUCAP" in the last 168 hours.  Discharge time spent: greater than  30 minutes.  Signed: Sheril Dines, MD [facility] 07/24/2023

## 2023-07-24 NOTE — Progress Notes (Signed)
..       REHABILITATION SERVICES REFERRAL        Occupational Therapy * Physical Therapy * Speech Therapy                           DATE 07/24/2023  PATIENT NAME: Gary Mccoy   PATIENT MRN  [id number] A       DIAGNOSIS/DIAGNOSIS CODE Acute on Chronic Respiratory Failure w/ Hypoxia J96.21   DATE OF DISCHARGE: 07/24/2023       PRIMARY CARE PHYSICIAN  Gary Mccoy    PCP PHONE/FAX   [phone]     [phone]       Dear Provider (Name: [facility] outpatient __  Fax: [phone]   I certify that I have examined this patient and that occupational/physical/speech therapy is necessary on an outpatient basis.    The patient has expressed interest in completing their recommended course of therapy at your  location.  Once a formal order from the patient's primary care physician has been obtained, please  contact him/her to schedule an appointment for evaluation at your earliest convenience.   [email or web address] ]  Physical Therapy Evaluate and Treat  [  ]  Occupational Therapy Evaluate and Treat  [  ]  Speech Therapy Evaluate and Treat         [name] primary care physician (listed above) must furnish and be responsible for a formal order such that the recommended services may be furnished while under the primary physician's care, and that the plan of care will be established and reviewed every 30 days (or more often if condition necessitates).

## 2023-08-03 ENCOUNTER — Other Ambulatory Visit: Payer: Self-pay

## 2023-08-03 ENCOUNTER — Emergency Department

## 2023-08-03 ENCOUNTER — Inpatient Hospital Stay
Admission: EM | Admit: 2023-08-03 | Discharge: 2023-08-15 | DRG: 291 | Disposition: A | Discharge status: Home / Self Care | Attending: Family Medicine | Admitting: Family Medicine

## 2023-08-03 DIAGNOSIS — X58XXXA Exposure to other specified factors, initial encounter: Secondary | ICD-10-CM | POA: Diagnosis not present

## 2023-08-03 DIAGNOSIS — I132 Hypertensive heart and chronic kidney disease with heart failure and with stage 5 chronic kidney disease, or end stage renal disease: Principal | ICD-10-CM | POA: Diagnosis present

## 2023-08-03 DIAGNOSIS — D72829 Elevated white blood cell count, unspecified: Secondary | ICD-10-CM | POA: Diagnosis present

## 2023-08-03 DIAGNOSIS — E785 Hyperlipidemia, unspecified: Secondary | ICD-10-CM | POA: Diagnosis present

## 2023-08-03 DIAGNOSIS — Z7901 Long term (current) use of anticoagulants: Secondary | ICD-10-CM

## 2023-08-03 DIAGNOSIS — R5381 Other malaise: Secondary | ICD-10-CM | POA: Diagnosis present

## 2023-08-03 DIAGNOSIS — Z992 Dependence on renal dialysis: Secondary | ICD-10-CM

## 2023-08-03 DIAGNOSIS — Z79899 Other long term (current) drug therapy: Secondary | ICD-10-CM | POA: Diagnosis not present

## 2023-08-03 DIAGNOSIS — S27892A Contusion of other specified intrathoracic organs, initial encounter: Secondary | ICD-10-CM | POA: Diagnosis not present

## 2023-08-03 DIAGNOSIS — N179 Acute kidney failure, unspecified: Secondary | ICD-10-CM | POA: Diagnosis present

## 2023-08-03 DIAGNOSIS — N184 Chronic kidney disease, stage 4 (severe): Secondary | ICD-10-CM | POA: Diagnosis present

## 2023-08-03 DIAGNOSIS — Z955 Presence of coronary angioplasty implant and graft: Secondary | ICD-10-CM

## 2023-08-03 DIAGNOSIS — N186 End stage renal disease: Secondary | ICD-10-CM | POA: Diagnosis present

## 2023-08-03 DIAGNOSIS — I10 Essential (primary) hypertension: Secondary | ICD-10-CM | POA: Diagnosis not present

## 2023-08-03 DIAGNOSIS — E871 Hypo-osmolality and hyponatremia: Secondary | ICD-10-CM | POA: Diagnosis present

## 2023-08-03 DIAGNOSIS — I639 Cerebral infarction, unspecified: Secondary | ICD-10-CM | POA: Diagnosis present

## 2023-08-03 DIAGNOSIS — K219 Gastro-esophageal reflux disease without esophagitis: Secondary | ICD-10-CM | POA: Diagnosis present

## 2023-08-03 DIAGNOSIS — N2581 Secondary hyperparathyroidism of renal origin: Secondary | ICD-10-CM | POA: Diagnosis present

## 2023-08-03 DIAGNOSIS — R0602 Shortness of breath: Secondary | ICD-10-CM | POA: Diagnosis not present

## 2023-08-03 DIAGNOSIS — R079 Chest pain, unspecified: Secondary | ICD-10-CM | POA: Diagnosis not present

## 2023-08-03 DIAGNOSIS — I509 Heart failure, unspecified: Principal | ICD-10-CM

## 2023-08-03 DIAGNOSIS — E875 Hyperkalemia: Secondary | ICD-10-CM | POA: Diagnosis present

## 2023-08-03 DIAGNOSIS — I420 Dilated cardiomyopathy: Secondary | ICD-10-CM | POA: Diagnosis present

## 2023-08-03 DIAGNOSIS — I739 Peripheral vascular disease, unspecified: Secondary | ICD-10-CM | POA: Diagnosis present

## 2023-08-03 DIAGNOSIS — D631 Anemia in chronic kidney disease: Secondary | ICD-10-CM | POA: Diagnosis present

## 2023-08-03 DIAGNOSIS — J9601 Acute respiratory failure with hypoxia: Secondary | ICD-10-CM | POA: Diagnosis present

## 2023-08-03 DIAGNOSIS — J9621 Acute and chronic respiratory failure with hypoxia: Secondary | ICD-10-CM | POA: Diagnosis present

## 2023-08-03 DIAGNOSIS — Z95828 Presence of other vascular implants and grafts: Secondary | ICD-10-CM

## 2023-08-03 DIAGNOSIS — Z8673 Personal history of transient ischemic attack (TIA), and cerebral infarction without residual deficits: Secondary | ICD-10-CM | POA: Diagnosis not present

## 2023-08-03 DIAGNOSIS — F17210 Nicotine dependence, cigarettes, uncomplicated: Secondary | ICD-10-CM | POA: Diagnosis present

## 2023-08-03 DIAGNOSIS — I5043 Acute on chronic combined systolic (congestive) and diastolic (congestive) heart failure: Secondary | ICD-10-CM | POA: Diagnosis present

## 2023-08-03 DIAGNOSIS — S15391A Other specified injury of right internal jugular vein, initial encounter: Secondary | ICD-10-CM | POA: Diagnosis not present

## 2023-08-03 DIAGNOSIS — E876 Hypokalemia: Secondary | ICD-10-CM | POA: Diagnosis not present

## 2023-08-03 DIAGNOSIS — J449 Chronic obstructive pulmonary disease, unspecified: Secondary | ICD-10-CM | POA: Diagnosis present

## 2023-08-03 DIAGNOSIS — Z823 Family history of stroke: Secondary | ICD-10-CM

## 2023-08-03 DIAGNOSIS — Z4901 Encounter for fitting and adjustment of extracorporeal dialysis catheter: Secondary | ICD-10-CM | POA: Diagnosis not present

## 2023-08-03 DIAGNOSIS — Z8249 Family history of ischemic heart disease and other diseases of the circulatory system: Secondary | ICD-10-CM

## 2023-08-03 DIAGNOSIS — I2510 Atherosclerotic heart disease of native coronary artery without angina pectoris: Secondary | ICD-10-CM | POA: Diagnosis present

## 2023-08-03 LAB — COMPREHENSIVE METABOLIC PANEL WITH GFR
ALT: 47 U/L — ABNORMAL HIGH (ref 0–44)
AST: 35 U/L (ref 15–41)
Albumin: 3.8 g/dL (ref 3.5–5.0)
Alkaline Phosphatase: 57 U/L (ref 38–126)
Anion gap: 13 (ref 5–15)
BUN: 89 mg/dL — ABNORMAL HIGH (ref 8–23)
CO2: 22 mmol/L (ref 22–32)
Calcium: 9.1 mg/dL (ref 8.9–10.3)
Chloride: 98 mmol/L (ref 98–111)
Creatinine, Ser: 4.16 mg/dL — ABNORMAL HIGH (ref 0.61–1.24)
GFR, Estimated: 15 mL/min — ABNORMAL LOW (ref >60)
Glucose, Bld: 132 mg/dL — ABNORMAL HIGH (ref 70–99)
Potassium: 5.3 mmol/L — ABNORMAL HIGH (ref 3.5–5.1)
Sodium: 133 mmol/L — ABNORMAL LOW (ref 135–145)
Total Bilirubin: 1.3 mg/dL — ABNORMAL HIGH (ref 0.0–1.2)
Total Protein: 7.5 g/dL (ref 6.5–8.1)

## 2023-08-03 LAB — BLOOD GAS, VENOUS
Acid-Base Excess: 0.6 mmol/L (ref 0.0–2.0)
Bicarbonate: 24.5 mmol/L (ref 20.0–28.0)
O2 Saturation: 72.6 %
Patient temperature: 37
pCO2, Ven: 36 mmHg — ABNORMAL LOW (ref 44–60)
pH, Ven: 7.44 — ABNORMAL HIGH (ref 7.25–7.43)
pO2, Ven: 40 mmHg (ref 32–45)

## 2023-08-03 LAB — GLUCOSE, CAPILLARY
Glucose-Capillary: 142 mg/dL — ABNORMAL HIGH (ref 70–99)
Glucose-Capillary: 165 mg/dL — ABNORMAL HIGH (ref 70–99)

## 2023-08-03 LAB — BASIC METABOLIC PANEL WITH GFR
Anion gap: 12 (ref 5–15)
BUN: 88 mg/dL — ABNORMAL HIGH (ref 8–23)
CO2: 22 mmol/L (ref 22–32)
Calcium: 9 mg/dL (ref 8.9–10.3)
Chloride: 101 mmol/L (ref 98–111)
Creatinine, Ser: 4.15 mg/dL — ABNORMAL HIGH (ref 0.61–1.24)
GFR, Estimated: 15 mL/min — ABNORMAL LOW (ref >60)
Glucose, Bld: 159 mg/dL — ABNORMAL HIGH (ref 70–99)
Potassium: 4.7 mmol/L (ref 3.5–5.1)
Sodium: 135 mmol/L (ref 135–145)

## 2023-08-03 LAB — CBC
HCT: 31.9 % — ABNORMAL LOW (ref 39.0–52.0)
Hemoglobin: 10.5 g/dL — ABNORMAL LOW (ref 13.0–17.0)
MCH: 29.2 pg (ref 26.0–34.0)
MCHC: 32.9 g/dL (ref 30.0–36.0)
MCV: 88.6 fL (ref 80.0–100.0)
Platelets: 155 10*3/uL (ref 150–400)
RBC: 3.6 MIL/uL — ABNORMAL LOW (ref 4.22–5.81)
RDW: 15.6 % — ABNORMAL HIGH (ref 11.5–15.5)
WBC: 11.6 10*3/uL — ABNORMAL HIGH (ref 4.0–10.5)
nRBC: 0 % (ref 0.0–0.2)

## 2023-08-03 LAB — MAGNESIUM: Magnesium: 2 mg/dL (ref 1.7–2.4)

## 2023-08-03 LAB — MRSA NEXT GEN BY PCR, NASAL: MRSA by PCR Next Gen: NOT DETECTED

## 2023-08-03 LAB — BRAIN NATRIURETIC PEPTIDE: B Natriuretic Peptide: 3586.8 pg/mL — ABNORMAL HIGH (ref 0.0–100.0)

## 2023-08-03 LAB — PHOSPHORUS: Phosphorus: 2.9 mg/dL (ref 2.5–4.6)

## 2023-08-03 LAB — TROPONIN I (HIGH SENSITIVITY): Troponin I (High Sensitivity): 45 ng/L — ABNORMAL HIGH (ref <18)

## 2023-08-03 MED ORDER — SENNOSIDES-DOCUSATE SODIUM 8.6-50 MG PO TABS: 1.0000 | ORAL_TABLET | Freq: Every evening | ORAL | Status: DC | PRN

## 2023-08-03 MED ORDER — IPRATROPIUM-ALBUTEROL 0.5-2.5 (3) MG/3ML IN SOLN: 3.0000 mL | Freq: Four times a day (QID) | RESPIRATORY_TRACT | Status: AC | PRN

## 2023-08-03 MED ORDER — INSULIN ASPART 100 UNIT/ML IV SOLN
5.0000 [IU] | Freq: Once | INTRAVENOUS | Status: AC
  Administered 2023-08-03: 5 [IU] via INTRAVENOUS
  Filled 2023-08-03: qty 0.05

## 2023-08-03 MED ORDER — CHLORHEXIDINE GLUCONATE CLOTH 2 % EX PADS
6.0000 | MEDICATED_PAD | Freq: Every day | CUTANEOUS | Status: DC
  Administered 2023-08-04 – 2023-08-15 (×12): 6 via TOPICAL

## 2023-08-03 MED ORDER — NITROGLYCERIN 0.4 MG SL SUBL: 0.4000 mg | SUBLINGUAL_TABLET | SUBLINGUAL | Status: DC | PRN

## 2023-08-03 MED ORDER — CLONIDINE HCL 0.1 MG PO TABS
0.1000 mg | ORAL_TABLET | Freq: Two times a day (BID) | ORAL | Status: DC
  Administered 2023-08-04 – 2023-08-10 (×13): 0.1 mg via ORAL
  Filled 2023-08-03 (×13): qty 1

## 2023-08-03 MED ORDER — AMLODIPINE BESYLATE 5 MG PO TABS
5.0000 mg | ORAL_TABLET | Freq: Two times a day (BID) | ORAL | Status: DC
  Administered 2023-08-04 – 2023-08-10 (×13): 5 mg via ORAL
  Filled 2023-08-03 (×13): qty 1

## 2023-08-03 MED ORDER — APIXABAN 2.5 MG PO TABS
2.5000 mg | ORAL_TABLET | Freq: Two times a day (BID) | ORAL | Status: DC
Start: 2023-08-03 — End: 2023-08-15
  Administered 2023-08-04 – 2023-08-15 (×21): 2.5 mg via ORAL
  Filled 2023-08-03 (×21): qty 1

## 2023-08-03 MED ORDER — ACETAMINOPHEN 325 MG PO TABS: 650.0000 mg | ORAL_TABLET | Freq: Four times a day (QID) | ORAL | Status: AC | PRN

## 2023-08-03 MED ORDER — ALLOPURINOL 100 MG PO TABS
100.0000 mg | ORAL_TABLET | Freq: Every day | ORAL | Status: DC
  Administered 2023-08-04 – 2023-08-15 (×10): 100 mg via ORAL
  Filled 2023-08-03 (×11): qty 1

## 2023-08-03 MED ORDER — ONDANSETRON HCL 4 MG PO TABS: 4.0000 mg | ORAL_TABLET | Freq: Four times a day (QID) | ORAL | Status: AC | PRN

## 2023-08-03 MED ORDER — ISOSORBIDE DINITRATE 20 MG PO TABS: 40.0000 mg | ORAL_TABLET | Freq: Three times a day (TID) | ORAL | Status: DC

## 2023-08-03 MED ORDER — HEPARIN SODIUM (PORCINE) 5000 UNIT/ML IJ SOLN: 5000.0000 [IU] | Freq: Three times a day (TID) | INTRAMUSCULAR | Status: DC

## 2023-08-03 MED ORDER — ROSUVASTATIN CALCIUM 10 MG PO TABS
10.0000 mg | ORAL_TABLET | Freq: Every day | ORAL | Status: DC
Start: 2023-08-03 — End: 2023-08-15
  Administered 2023-08-04 – 2023-08-14 (×11): 10 mg via ORAL
  Filled 2023-08-03 (×12): qty 1

## 2023-08-03 MED ORDER — FUROSEMIDE 10 MG/ML IJ SOLN: 40.0000 mg | Freq: Once | INTRAMUSCULAR | Status: DC

## 2023-08-03 MED ORDER — VITAMIN D 25 MCG (1000 UNIT) PO TABS
1000.0000 [IU] | ORAL_TABLET | Freq: Every day | ORAL | Status: DC
  Administered 2023-08-04 – 2023-08-15 (×10): 1000 [IU] via ORAL
  Filled 2023-08-03 (×10): qty 1

## 2023-08-03 MED ORDER — ACETAMINOPHEN 650 MG RE SUPP: 650.0000 mg | Freq: Four times a day (QID) | RECTAL | Status: AC | PRN

## 2023-08-03 MED ORDER — PANTOPRAZOLE SODIUM 40 MG PO TBEC
40.0000 mg | DELAYED_RELEASE_TABLET | Freq: Every day | ORAL | Status: DC
  Administered 2023-08-04 – 2023-08-15 (×10): 40 mg via ORAL
  Filled 2023-08-03 (×10): qty 1

## 2023-08-03 MED ORDER — DEXTROSE 50 % IV SOLN
1.0000 | Freq: Once | INTRAVENOUS | Status: AC
  Administered 2023-08-03: 50 mL via INTRAVENOUS
  Filled 2023-08-03: qty 50

## 2023-08-03 MED ORDER — HYDRALAZINE HCL 20 MG/ML IJ SOLN: 5.0000 mg | Freq: Four times a day (QID) | INTRAMUSCULAR | Status: AC | PRN

## 2023-08-03 MED ORDER — IPRATROPIUM-ALBUTEROL 0.5-2.5 (3) MG/3ML IN SOLN
9.0000 mL | Freq: Once | RESPIRATORY_TRACT | Status: AC
  Administered 2023-08-03: 9 mL via RESPIRATORY_TRACT
  Filled 2023-08-03: qty 9

## 2023-08-03 MED ORDER — HYDRALAZINE HCL 50 MG PO TABS
100.0000 mg | ORAL_TABLET | Freq: Three times a day (TID) | ORAL | Status: DC
  Administered 2023-08-03 – 2023-08-08 (×12): 100 mg via ORAL
  Filled 2023-08-03 (×12): qty 2

## 2023-08-03 MED ORDER — ONDANSETRON HCL 4 MG/2ML IJ SOLN: 4.0000 mg | Freq: Four times a day (QID) | INTRAMUSCULAR | Status: AC | PRN

## 2023-08-03 MED ORDER — FUROSEMIDE 10 MG/ML IJ SOLN
80.0000 mg | Freq: Once | INTRAMUSCULAR | Status: AC
  Administered 2023-08-03: 80 mg via INTRAVENOUS
  Filled 2023-08-03: qty 8

## 2023-08-03 MED ORDER — FUROSEMIDE 10 MG/ML IJ SOLN
4.0000 mg/h | INTRAVENOUS | Status: DC
  Administered 2023-08-03 – 2023-08-05 (×2): 4 mg/h via INTRAVENOUS
  Filled 2023-08-03 (×3): qty 20

## 2023-08-03 NOTE — Progress Notes (Signed)
 Patient transported from Emergency room 14 to ICU room 19. No issues with transportation.

## 2023-08-03 NOTE — ED Triage Notes (Signed)
  Pt arrived via EMS for Resp distress from home. Pt arrived with CPAP on. Pt states he has been having SOB since this morning. Pt placed on BiPap upon arrival to ED.

## 2023-08-03 NOTE — ED Provider Notes (Signed)
 [facility] Provider Note    Event Date/Time   First MD Initiated Contact with Patient 08/03/23 1412     (approximate)   History   Shortness of Breath   HPI  Gary Mccoy. is a 73 y.o. male past medical history significant for CHF, COPD, CKD stage IV, presents to the emergency department with shortness of breath.  Patient had a recent hospitalization was discharged on 07/24/2023 following a hospitalization for heart failure exacerbation.  Patient required a temporary dialysis sessions during that hospitalization.  Does not currently have a dialysis catheter.  Does still make urine.  Currently on Lasix  40 mg twice daily.  States that he has gained approximately 13 pounds over his dry weight.  Endorses shortness of breath and chest pain.  When EMS arrived patient was significantly short of breath with tachypnea, placed on CPAP prior to arrival.  No history of DVT or PE.     Physical Exam   Triage Vital Signs: ED Triage Vitals  Encounter Vitals Group     BP 08/03/23 1417 (!) 155/82     Systolic BP Percentile --      Diastolic BP Percentile --      Pulse Rate 08/03/23 1417 60     Resp 08/03/23 1417 (!) 30     Temp 08/03/23 1417 98.7 F (37.1 C)     Temp Source 08/03/23 1417 Oral     SpO2 08/03/23 1416 100 %     Weight 08/03/23 1421 195 lb 11.2 oz (88.8 kg)     Height 08/03/23 1421 6\' 1"  (1.854 m)     Head Circumference --      Peak Flow --      Pain Score 08/03/23 1420 0     Pain Loc --      Pain Education --      Exclude from Growth Chart --     Most recent vital signs: Vitals:   08/03/23 1416 08/03/23 1417  BP:  (!) 155/82  Pulse:  60  Resp:  (!) 30  Temp:  98.7 F (37.1 C)  SpO2: 100% 100%    Physical Exam Constitutional:      Appearance: He is well-developed.  HENT:     Head: Atraumatic.  Eyes:     Conjunctiva/sclera: Conjunctivae normal.  Cardiovascular:     Rate and Rhythm: Regular rhythm.  Pulmonary:     Effort: No  respiratory distress.     Breath sounds: Examination of the right-lower field reveals rales. Examination of the left-lower field reveals rales. Wheezing and rales present.  Chest:     Chest wall: No tenderness.  Abdominal:     Comments: Abdominal distention with no rebound or guarding.  Musculoskeletal:     Cervical back: Normal range of motion.     Right lower leg: Edema present.     Left lower leg: Edema present.     Comments: Significant edema to bilateral lower extremities  Skin:    General: Skin is warm.  Neurological:     Mental Status: He is alert. Mental status is at baseline.     IMPRESSION / MDM / ASSESSMENT AND PLAN / ED COURSE  I reviewed the triage vital signs and the nursing notes.  Differential diagnosis including COPD exacerbation, CHF exacerbation, renal failure, pneumonia, ACS, anemia  On arrival speaking in 2 word sentences.  Placed on BiPAP and given DuoNeb treatments.  EMS gave IV Solu-Medrol  125 mg  EKG  I,  Viviano Ground, the attending physician, personally viewed and interpreted this ECG.  EKG showed normal sinus rhythm.  ST depression to the lateral leads.  No significant ST elevation.  Normal intervals.  No tachycardic or bradycardic dysrhythmias while on cardiac telemetry.  RADIOLOGY I independently reviewed imaging, my interpretation of imaging: No significant cardiomegaly or pulmonary edema.  Read as no acute findings  LABS (all labs ordered are listed, but only abnormal results are displayed) Labs interpreted as -    Labs Reviewed  CBC - Abnormal; Notable for the following components:      Result Value   WBC 11.6 (*)    RBC 3.60 (*)    Hemoglobin 10.5 (*)    HCT 31.9 (*)    RDW 15.6 (*)    All other components within normal limits  COMPREHENSIVE METABOLIC PANEL WITH GFR - Abnormal; Notable for the following components:   Sodium 133 (*)    Potassium 5.3 (*)    Glucose, Bld 132 (*)    BUN 89 (*)    Creatinine, Ser 4.16 (*)    ALT 47  (*)    Total Bilirubin 1.3 (*)    GFR, Estimated 15 (*)    All other components within normal limits  BRAIN NATRIURETIC PEPTIDE - Abnormal; Notable for the following components:   B Natriuretic Peptide 3,586.8 (*)    All other components within normal limits  BLOOD GAS, VENOUS - Abnormal; Notable for the following components:   pH, Ven 7.44 (*)    pCO2, Ven 36 (*)    All other components within normal limits  TROPONIN I (HIGH SENSITIVITY) - Abnormal; Notable for the following components:   Troponin I (High Sensitivity) 45 (*)    All other components within normal limits  MAGNESIUM   PHOSPHORUS     MDM  No significant hypercarbia on VBG.  Leukocytosis and anemia but hemoglobin appears to be close to his baseline.  No signs or symptoms consistent with a GI bleed.  Creatinine elevated to 4.16 from baseline of 3.19 at time of discharge, does have an elevated BUN at 89.  Potassium mildly elevated at 5.3.  No EKG changes.  Troponin mildly elevated at 45 which is lower than his previous, likely demand ischemia in the setting of heart failure exacerbation.  BNP greater than 3000.  Discussed with nephrology, Dr. Korrapati, given that the patient required a short course of dialysis during his last hospitalization.  Recommended 80 mg of Lasix  and a Lasix  drip.  Consulted hospitalist for admission for acute on chronic heart failure exacerbation     PROCEDURES:  Critical Care performed: yes  .Critical Care  Performed by: Viviano Ground, MD Authorized by: Viviano Ground, MD   Critical care provider statement:    Critical care time (minutes):  30   Critical care was necessary to treat or prevent imminent or life-threatening deterioration of the following conditions:  Cardiac failure and respiratory failure   Critical care was time spent personally by me on the following activities:  Development of treatment plan with patient or surrogate, discussions with consultants, evaluation of patient's  response to treatment, examination of patient, ordering and review of laboratory studies, ordering and review of radiographic studies, ordering and performing treatments and interventions, pulse oximetry, re-evaluation of patient's condition and review of old charts   Care discussed with: admitting provider     Patient's presentation is most consistent with acute presentation with potential threat to life or bodily function.   MEDICATIONS ORDERED IN  ED: Medications  furosemide  (LASIX ) 200 mg in dextrose  5 % 100 mL (2 mg/mL) infusion (has no administration in time range)  acetaminophen  (TYLENOL ) tablet 650 mg (has no administration in time range)    Or  acetaminophen  (TYLENOL ) suppository 650 mg (has no administration in time range)  ondansetron  (ZOFRAN ) tablet 4 mg (has no administration in time range)    Or  ondansetron  (ZOFRAN ) injection 4 mg (has no administration in time range)  heparin  injection 5,000 Units (has no administration in time range)  senna-docusate (Senokot-S) tablet 1 tablet (has no administration in time range)  ipratropium-albuterol  (DUONEB) 0.5-2.5 (3) MG/3ML nebulizer solution 3 mL (has no administration in time range)  ipratropium-albuterol  (DUONEB) 0.5-2.5 (3) MG/3ML nebulizer solution 9 mL (9 mLs Nebulization Given 08/03/23 1435)  furosemide  (LASIX ) injection 80 mg (80 mg Intravenous Given 08/03/23 1516)    FINAL CLINICAL IMPRESSION(S) / ED DIAGNOSES   Final diagnoses:  Acute on chronic congestive heart failure, unspecified heart failure type (HCC)  AKI (acute kidney injury) (HCC)  Hyperkalemia     Rx / DC Orders   ED Discharge Orders     None        Note:  This document was prepared using Dragon voice recognition software and may include unintentional dictation errors.   Viviano Ground, MD 08/03/23 [phone]

## 2023-08-03 NOTE — Plan of Care (Signed)

## 2023-08-03 NOTE — Hospital Course (Addendum)
 Gary Mccoy. is a 73 year old male with hide for heart failure, COPD, hypertension, hyperlipidemia, history of CVA, dilated cardiomyopathy, history of CAD status post PCI with DES in 2023, PAD, on anticoagulation, with known CKD stage IV who presented to the ED with shortness of breath.  Patient was found to be acutely volume overloaded.  He was started on IV diuresis.  Ultimately his CKD progressed and he was declared ESRD.  He underwent PermCath placement with vascular surgery on 5/20.  He began receiving hemodialysis without issue.  Stay was prolonged pending outpatient hemodialysis arrangements.

## 2023-08-03 NOTE — Assessment & Plan Note (Signed)
 Mild, and appears to be relatively chronically elevated over the last 3 weeks Continue treatment per above Check CBC in a.m.

## 2023-08-03 NOTE — H&P (Signed)
 History and Physical   Gary Mccoy. ZOX:[id number] DOB: 1950/08/04 DOA: 08/03/2023  PCP: Sari Cunning, MD  Outpatient Specialists: Dr. Kathy Parker clinic cardiology Patient coming from: Home via EMS  I have personally briefly reviewed patient's old medical records in [facility] EMR.  Chief Concern: Shortness of breath  HPI: [name] is a 73 year old male with history of combined heart failure, COPD, CKD stage IV, hypertension, hyperlipidemia, history of CVA, dilated cardiomyopathy, history of CAD status post PCI with DES in 2023, PAD, on oral anticoagulation, who presents emergency department for chief concerns of shortness of breath.  Patient arrived to the ED on CPAP and was transition to BiPAP therapy  Vitals in the ED showed t98.7, rr 30, hr 60, blood pressure 155/82, SpO2 100% on BiPAP therapy  Serum sodium is 133, potassium 5.3, chloride 98, bicarb 22, BUN of 89, serum creatinine 4.16, EGFR 15, nonfasting blood glucose 132, WBC 11.6, hemoglobin 10.5, platelets of 155.  ED treatment: Furosemide  80 mg IV one-time dose, DuoNebs one-time treatment.  EDP discussed with nephrology, who is aware of patient and recommends furosemide  gtt. as well.  Per ED provider, patient has been admitted for similar in the past and had apparent plans for CRRT.  Patient does not have hemodialysis access at this time. ---------------------------------- At bedside, patient able to tell me his first and last name, age, location, current calendar year.  Patient and spouse reports that he developed worsening shortness of breath at around 930 this morning.  Reports that since he has been discharged from the hospital, he has not gained any extra weight however he states that before he came into the hospital, he was about 170 pounds and when he left the hospital he was about 184 and this has remained the same.  Spouse does endorse that his abdomen area appears to be tighter.   Initially, when patient came home from the hospital by the next day, his legs became significantly swollen and he was seen by outpatient nephrology who increased his furosemide  which did improve the swelling of his lower extremities significantly.  Reports his last urination was at around 730 this morning.  Social history: He lives at home with his wife.  He denies tobacco, EtOH, recreational drug use.  He is retired.  ROS: Constitutional: no weight change, no fever ENT/Mouth: no sore throat, no rhinorrhea Eyes: no eye pain, no vision changes Cardiovascular: no chest pain, + dyspnea,  + edema, no palpitations Respiratory: no cough, no sputum, no wheezing Gastrointestinal: no nausea, no vomiting, no diarrhea, no constipation Genitourinary: no urinary incontinence, no dysuria, no hematuria Musculoskeletal: no arthralgias, no myalgias Skin: no skin lesions, no pruritus, Neuro: + weakness, no loss of consciousness, no syncope Psych: no anxiety, no depression, + decrease appetite Heme/Lymph: no bruising, no bleeding  ED Course: Discussed with EDP, patient requiring hospitalization for chief concerns of heart failure exacerbation.  Assessment/Plan  Principal Problem:   Acute hypoxic respiratory failure (HCC) Active Problems:   Acute on chronic respiratory failure with hypoxia (HCC)   Acute renal failure superimposed on stage 4 chronic kidney disease (HCC)   PAD s/p aortobifemoral bypass 2017 (peripheral artery disease)   Benign essential HTN   Leukocytosis   Hyperlipidemia   CVA (cerebral vascular accident) (HCC)   Acute exacerbation of CHF (congestive heart failure) (HCC)   CKD (chronic kidney disease) stage 4, GFR 15-29 ml/min (HCC)   CAD S/P percutaneous coronary angioplasty   Assessment and Plan:  *  Acute hypoxic respiratory failure (HCC) Suspect secondary to heart failure exacerbation Continue BiPAP therapy Pulse oximetry continuously ordered on admission  Acute renal  failure superimposed on stage 4 chronic kidney disease (HCC) Suspect cardiorenal Baseline serum creatinine is 2.64-3.52 over the last 3 weeks Status post furosemide  80 mg IV per EDP Continue with furosemide  infusion per nephrology recommendation Strict I's and O's Recheck BMP in the a.m.  Acute on chronic respiratory failure with hypoxia (HCC) Continue BiPAP therapy Continuous pulse oximetry ordered on admission  Hyperlipidemia Rosuvastatin  20 mg nightly resumed  Leukocytosis Mild, and appears to be relatively chronically elevated over the last 3 weeks Continue treatment per above Check CBC in a.m.  Benign essential HTN Home isosorbide  mononitrate 40 mg 3 times daily, hydralazine  100 mg p.o. 3 times daily, amlodipine  5 mg p.o. twice daily, clonidine  0.1 mg p.o. twice daily were resumed on admission Hydralazine  5 mg IV every 6 hours as needed for SBP greater than 175, 5 days ordered  Chart reviewed.   DVT prophylaxis:  Eliquis  2.5 mg BID Code Status: Full code Diet: N.p.o., patient on BiPAP therapy Family Communication: Updated spouse at bedside with patient's permission Disposition Plan: Pending clinical course Consults called: Nephrology Admission status: Stepdown, inpatient  Past Medical History:  Diagnosis Date   Arthritis    lower back   Back pain    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    CHF (congestive heart failure) (HCC)    Chronic kidney disease    Complication of anesthesia    Violent with hallucinations after procedure 10/2015   Coronary artery disease    DDD (degenerative disc disease), lumbar    Duodenal ulcer    Elevated lipids    GERD (gastroesophageal reflux disease)    Leg weakness, bilateral    s/p lumbar surgery   PAD (peripheral artery disease) (HCC)    Polyradiculitis    Stroke (HCC)    Rt lower leg  estimated 10 - 12 stokes in last 5 years   Wears dentures    full upper and lower   Past Surgical History:  Procedure Laterality Date   AORTA -  BILATERAL FEMORAL ARTERY BYPASS GRAFT N/A 11/08/2015   Procedure: AORTA BIFEMORAL BYPASS GRAFT;  Surgeon: Jackquelyn Mass, MD;  Location: [facility];  Service: Vascular;  Laterality: N/A;   BACK SURGERY  2005   CATARACT EXTRACTION W/PHACO Right 02/20/2022   Procedure: CATARACT EXTRACTION PHACO AND INTRAOCULAR LENS PLACEMENT (IOC) RIGHT;  Surgeon: Annell Kidney, MD;  Location: [facility];  Service: Ophthalmology;  Laterality: Right;  5.77 0:52.3   CATARACT EXTRACTION W/PHACO Left 03/06/2022   Procedure: CATARACT EXTRACTION PHACO AND INTRAOCULAR LENS PLACEMENT (IOC) LEFT;  Surgeon: Annell Kidney, MD;  Location: [facility];  Service: Ophthalmology;  Laterality: Left;  5.77 1:07.5   CERVICAL DISC SURGERY  1987   COLONOSCOPY WITH PROPOFOL  N/A 12/30/2017   Procedure: COLONOSCOPY WITH PROPOFOL ;  Surgeon: Toledo, Alphonsus Jeans, MD;  Location: [facility];  Service: Gastroenterology;  Laterality: N/A;   COLONOSCOPY WITH PROPOFOL  N/A 02/02/2021   Procedure: COLONOSCOPY WITH PROPOFOL ;  Surgeon: Shane Darling, MD;  Location: [facility];  Service: Endoscopy;  Laterality: N/A;  Melena   ENDARTERECTOMY FEMORAL Bilateral 11/08/2015   Procedure: ENDARTERECTOMY FEMORAL;  Surgeon: Jackquelyn Mass, MD;  Location: [facility];  Service: Vascular;  Laterality: Bilateral;  common, SFA, and profundis  Aortic endarterectomy    ESOPHAGOGASTRODUODENOSCOPY (EGD) WITH PROPOFOL  N/A 09/04/2015   Procedure: ESOPHAGOGASTRODUODENOSCOPY (EGD) WITH PROPOFOL ;  Surgeon: Marnee Sink, MD;  Location: [facility];  Service: Endoscopy;  Laterality: N/A;   ESOPHAGOGASTRODUODENOSCOPY (EGD) WITH PROPOFOL  N/A 02/02/2021   Procedure: ESOPHAGOGASTRODUODENOSCOPY (EGD) WITH PROPOFOL ;  Surgeon: Shane Darling, MD;  Location: [facility];  Service: Endoscopy;  Laterality: N/A;  Melena   LUMBAR LAMINECTOMY  2006   RIGHT/LEFT HEART CATH AND CORONARY ANGIOGRAPHY Bilateral 03/28/2021   Procedure:  RIGHT/LEFT HEART CATH AND CORONARY ANGIOGRAPHY;  Surgeon: Antonette Batters, MD;  Location: [facility];  Service: Cardiovascular;  Laterality: Bilateral;   SEPTOPLASTY N/A 09/28/2020   Procedure: SEPTOPLASTY;  Surgeon: Mellody Sprout, MD;  Location: [facility];  Service: ENT;  Laterality: N/A;   TEE WITHOUT CARDIOVERSION N/A 09/22/2015   Procedure: TRANSESOPHAGEAL ECHOCARDIOGRAM (TEE);  Surgeon: Ronney Cola, MD;  Location: [facility];  Service: Cardiovascular;  Laterality: N/A;   TEMPORARY DIALYSIS CATHETER N/A 07/16/2023   Procedure: TEMPORARY DIALYSIS CATHETER;  Surgeon: Celso College, MD;  Location: [facility];  Service: Cardiovascular;  Laterality: N/A;   TURBINATE REDUCTION Bilateral 09/28/2020   Procedure: TURBINATE REDUCTION;  Surgeon: Mellody Sprout, MD;  Location: [facility];  Service: ENT;  Laterality: Bilateral;   Social History:  reports that he has been smoking cigarettes. He has a 45 pack-year smoking history. He has never used smokeless tobacco. He reports current alcohol use. He reports that he does not use drugs.  No Known Allergies Family History  Problem Relation Age of Onset   Heart attack Mother    Hypertension Mother    Varicose Veins Mother    Diabetes Father    Heart attack Father    Heart attack Maternal Grandmother    Stroke Maternal Grandmother    Family history: Family history reviewed and not pertinent.  Prior to Admission medications   Medication Sig Start Date End Date Taking? Authorizing Provider  acetaminophen  (TYLENOL ) 500 MG tablet Take 500 mg by mouth every 6 (six) hours as needed.    [provider]  albuterol  (VENTOLIN  HFA) 108 (90 Base) MCG/ACT inhaler Inhale 2 puffs into the lungs every 6 (six) hours as needed for wheezing or shortness of breath. 06/20/22   Patel, Sona, MD  allopurinol  (ZYLOPRIM ) 100 MG tablet Take 100 mg by mouth daily. 09/09/22 09/09/23  [provider]  amLODipine  (NORVASC ) 5 MG  tablet Take 5 mg by mouth 2 (two) times daily.    [provider]  apixaban  (ELIQUIS ) 2.5 MG TABS tablet Take 2.5 mg by mouth 2 (two) times daily. 03/29/22   [provider]  carvedilol  (COREG ) 25 MG tablet Take 1 tablet (25 mg total) by mouth 2 (two) times daily with a meal. 07/24/23   Sheril Dines, MD  Cholecalciferol  (VITAMIN D -1000 MAX ST) 25 MCG (1000 UT) tablet Take 1,000 Units by mouth daily.    [provider]  cloNIDine  (CATAPRES ) 0.1 MG tablet Take 0.1 mg by mouth 2 (two) times daily. 06/27/22   [provider]  Coenzyme Q10 (COQ10) 200 MG CAPS Take 200 mg by mouth daily.    [provider]  hydrALAZINE  (APRESOLINE ) 100 MG tablet Take 100 mg by mouth 3 (three) times daily.    [provider]  Iron-Vitamin C (VITRON-C) 65-125 MG TABS Take 1 tablet by mouth daily.    [provider]  isosorbide  dinitrate (ISORDIL ) 40 MG tablet Take 1 tablet (40 mg total) by mouth 3 (three) times daily. 06/20/22   Patel, Sona, MD  magnesium  oxide (MAG-OX) 400 MG tablet Take 400 mg by  mouth daily.    [provider]  Multiple Vitamins-Minerals (MULTIVITAMIN ADULT PO) Take 1 tablet by mouth daily. Men    [provider]  nitroGLYCERIN  (NITROSTAT ) 0.4 MG SL tablet Place 1 tablet (0.4 mg total) under the tongue every 5 (five) minutes x 3 doses as needed for chest pain. 06/20/22   Patel, Sona, MD  Omega-3 Fatty Acids (FISH OIL PO) Take by mouth.    [provider]  pantoprazole  (PROTONIX ) 40 MG tablet Take 40 mg by mouth daily. 03/26/22   [provider]  potassium chloride  SA (KLOR-CON  M) 20 MEQ tablet Take 1 tablet (20 mEq total) by mouth daily. 07/24/23   Sheril Dines, MD  rosuvastatin  (CRESTOR ) 40 MG tablet Take 20 mg by mouth daily.    [provider]  vitamin B-12 (CYANOCOBALAMIN ) 1000 MCG tablet Take 1,000 mcg by mouth daily.    [provider]   Physical Exam: Vitals:   08/03/23 1421 08/03/23  1430 08/03/23 1500 08/03/23 1530  BP:  (!) 170/66 (!) 119/108 (!) 170/72  Pulse:  (!) 56 (!) 58 62  Resp:  20 19 (!) 23  Temp:      TempSrc:      SpO2:  97% 97% 97%  Weight: 88.8 kg     Height: 6\' 1"  (1.854 m)      Constitutional: appears age-appropriate, frail, dyspneic on BiPAP therapy Eyes: PERRL, lids and conjunctivae normal ENMT: Mucous membranes are moist. Posterior pharynx clear of any exudate or lesions. Age-appropriate dentition. Hearing appropriate Neck: normal, supple, no masses, no thyromegaly Respiratory: clear to auscultation bilaterally, no wheezing, no crackles. Normal respiratory effort. No accessory muscle use.  Cardiovascular: Regular rate and rhythm, no murmurs / rubs / gallops. No extremity edema. 2+ pedal pulses. No carotid bruits.  Abdomen: no tenderness, no masses palpated, no hepatosplenomegaly. Bowel sounds positive.  Musculoskeletal: no clubbing / cyanosis. No joint deformity upper and lower extremities. Good ROM, no contractures, no atrophy. Normal muscle tone.  Skin: no rashes, lesions, ulcers. No induration Neurologic: Sensation intact. Strength 5/5 in all 4.  Psychiatric: Normal judgment and insight. Alert and oriented x 3. Normal mood.   EKG: Ordered and pending completion at this time  Chest x-ray on Admission: I personally reviewed and I agree with radiologist reading as below.  DG Chest Portable 1 View Result Date: 08/03/2023 CLINICAL DATA:  Dyspnea. EXAM: PORTABLE CHEST 1 VIEW COMPARISON:  July 11, 2023. FINDINGS: Stable cardiomediastinal silhouette. Both lungs are clear. The visualized skeletal structures are unremarkable. IMPRESSION: No active disease. Electronically Signed   By: Rosalene Colon M.D.   On: 08/03/2023 14:39   Labs on Admission: I have personally reviewed following labs  CBC: Recent Labs  Lab 08/03/23 1424  WBC 11.6*  HGB 10.5*  HCT 31.9*  MCV 88.6  PLT 155   Basic Metabolic Panel: Recent Labs  Lab 08/03/23 1424  NA  133*  K 5.3*  CL 98  CO2 22  GLUCOSE 132*  BUN 89*  CREATININE 4.16*  CALCIUM  9.1  MG 2.0  PHOS 2.9   GFR: Estimated Creatinine Clearance: 18.4 mL/min (A) (by C-G formula based on SCr of 4.16 mg/dL (H)).  Liver Function Tests: Recent Labs  Lab 08/03/23 1424  AST 35  ALT 47*  ALKPHOS 57  BILITOT 1.3*  PROT 7.5  ALBUMIN 3.8   Urine analysis:    Component Value Date/Time   COLORURINE STRAW (A) 10/29/2021 0030   APPEARANCEUR CLEAR (A) 10/29/2021 0030  LABSPEC 1.005 10/29/2021 0030   PHURINE 5.0 10/29/2021 0030   GLUCOSEU NEGATIVE 10/29/2021 0030   HGBUR NEGATIVE 10/29/2021 0030   BILIRUBINUR NEGATIVE 10/29/2021 0030   KETONESUR NEGATIVE 10/29/2021 0030   PROTEINUR NEGATIVE 10/29/2021 0030   NITRITE NEGATIVE 10/29/2021 0030   LEUKOCYTESUR NEGATIVE 10/29/2021 0030   This document was prepared using Dragon Voice Recognition software and may include unintentional dictation errors.  Dr. Reinhold Carbine [facility]  If 7PM-7AM, please contact overnight-coverage provider If 7AM-7PM, please contact day attending provider [email or web address]  08/03/2023, 4:36 PM

## 2023-08-03 NOTE — Assessment & Plan Note (Addendum)
 Suspect secondary to heart failure exacerbation Continue BiPAP therapy Pulse oximetry continuously ordered on admission

## 2023-08-03 NOTE — Assessment & Plan Note (Addendum)
 Suspect cardiorenal Baseline serum creatinine is 2.64-3.52 over the last 3 weeks Status post furosemide  80 mg IV per EDP Continue with furosemide  infusion per nephrology recommendation Strict I's and O's Recheck BMP in the a.m.

## 2023-08-03 NOTE — ED Notes (Signed)
 Advised nurse that patient has ready bed

## 2023-08-03 NOTE — Assessment & Plan Note (Addendum)
 Home isosorbide  mononitrate 40 mg 3 times daily, hydralazine  100 mg p.o. 3 times daily, amlodipine  5 mg p.o. twice daily, clonidine  0.1 mg p.o. twice daily were resumed on admission Hydralazine  5 mg IV every 6 hours as needed for SBP greater than 175, 5 days ordered

## 2023-08-03 NOTE — Assessment & Plan Note (Signed)
 Continue BiPAP therapy Continuous pulse oximetry ordered on admission

## 2023-08-03 NOTE — Assessment & Plan Note (Signed)
-   Rosuvastatin 20 mg nightly resumed 

## 2023-08-03 NOTE — ED Notes (Signed)
 Attempted to call report. Was told RN would need to call me back

## 2023-08-03 NOTE — ED Notes (Signed)
 Report given to ICU RN, Vonzell Guerin

## 2023-08-04 DIAGNOSIS — I509 Heart failure, unspecified: Secondary | ICD-10-CM

## 2023-08-04 DIAGNOSIS — J9601 Acute respiratory failure with hypoxia: Secondary | ICD-10-CM | POA: Diagnosis not present

## 2023-08-04 DIAGNOSIS — N184 Chronic kidney disease, stage 4 (severe): Secondary | ICD-10-CM

## 2023-08-04 DIAGNOSIS — N179 Acute kidney failure, unspecified: Secondary | ICD-10-CM | POA: Diagnosis not present

## 2023-08-04 LAB — POTASSIUM
Potassium: 4.5 mmol/L (ref 3.5–5.1)
Potassium: 4.2 mmol/L (ref 3.5–5.1)
Potassium: 3.9 mmol/L (ref 3.5–5.1)
Potassium: 3.9 mmol/L (ref 3.5–5.1)
Potassium: 3.6 mmol/L (ref 3.5–5.1)
Potassium: 3.5 mmol/L (ref 3.5–5.1)

## 2023-08-04 LAB — ALBUMIN: Albumin: 3.2 g/dL — ABNORMAL LOW (ref 3.5–5.0)

## 2023-08-04 LAB — BASIC METABOLIC PANEL WITH GFR
Anion gap: 14 (ref 5–15)
BUN: 91 mg/dL — ABNORMAL HIGH (ref 8–23)
CO2: 23 mmol/L (ref 22–32)
Calcium: 9.1 mg/dL (ref 8.9–10.3)
Chloride: 96 mmol/L — ABNORMAL LOW (ref 98–111)
Creatinine, Ser: 4.19 mg/dL — ABNORMAL HIGH (ref 0.61–1.24)
GFR, Estimated: 14 mL/min — ABNORMAL LOW (ref >60)
Glucose, Bld: 123 mg/dL — ABNORMAL HIGH (ref 70–99)
Potassium: 4.2 mmol/L (ref 3.5–5.1)
Sodium: 133 mmol/L — ABNORMAL LOW (ref 135–145)

## 2023-08-04 LAB — CBC
HCT: 29.7 % — ABNORMAL LOW (ref 39.0–52.0)
Hemoglobin: 9.8 g/dL — ABNORMAL LOW (ref 13.0–17.0)
MCH: 28.9 pg (ref 26.0–34.0)
MCHC: 33 g/dL (ref 30.0–36.0)
MCV: 87.6 fL (ref 80.0–100.0)
Platelets: 129 10*3/uL — ABNORMAL LOW (ref 150–400)
RBC: 3.39 MIL/uL — ABNORMAL LOW (ref 4.22–5.81)
RDW: 15.6 % — ABNORMAL HIGH (ref 11.5–15.5)
WBC: 5.8 10*3/uL (ref 4.0–10.5)
nRBC: 0 % (ref 0.0–0.2)

## 2023-08-04 MED ORDER — ISOSORBIDE MONONITRATE ER 30 MG PO TB24
60.0000 mg | ORAL_TABLET | Freq: Every day | ORAL | Status: DC
  Administered 2023-08-04 – 2023-08-15 (×9): 60 mg via ORAL
  Filled 2023-08-04 (×9): qty 2

## 2023-08-04 NOTE — Progress Notes (Signed)
 [facility]  ROUNDING NOTE   Subjective:  [name] was admitted to [facility] on 08/03/2023 with recurrent shortness of breath and weight gain.  Patient is followed by Dr Zelda Hickman outpatient.   Update: Patient well-known to us  from last hospitalization where he was significantly fluid overloaded and initially started on dialysis. He did have some renal recovery though his urine output was a bit lower than expected close to his discharge. He was seen back in the office on 07/28/2023. Since then he has gained additional weight. Upon readmission patient restarted on Lasix  drip. However EGFR still low at 14. Suspect that the creatinine clearance of 19 previously was an overestimate of renal function.    Objective:  Vital signs in last 24 hours:  Temp:  [97.3 F (36.3 C)-97.8 F (36.6 C)] 97.3 F (36.3 C) (05/19 1133) Pulse Rate:  [58-93] 78 (05/19 1400) Resp:  [12-30] 20 (05/19 1400) BP: (148-173)/(60-100) 157/67 (05/19 1400) SpO2:  [93 %-100 %] 94 % (05/19 1400) FiO2 (%):  [32 %] 32 % (05/19 0027) Weight:  [83 kg] 83 kg (05/18 1650)  Weight change:  Filed Weights   08/03/23 1421 08/03/23 1650  Weight: 88.8 kg 83 kg    Intake/Output: I/O last 3 completed shifts: In: 27.3 [I.V.:27.3] Out: 1050 [Urine:1050]   Intake/Output this shift:  Total I/O In: 493.3 [P.O.:480; I.V.:13.3] Out: 850 [Urine:850]  Physical Exam: General: Sitting up in bed  Head: Normocephalic, atraumatic. Moist oral mucosal membranes  Eyes: Anicteric  Lungs:  Basilar rales  Heart: Regular rate and rhythm  Abdomen:  Soft, nontender  Extremities: Trace peripheral edema.  Neurologic: Alert, moving all four extremities  Skin: No lesions  Access: None    Basic Metabolic Panel: Recent Labs  Lab 08/03/23 1424 08/03/23 1954 08/03/23 2256 08/04/23 0305 08/04/23 0742 08/04/23 1126  NA 133* 135  --  133*  --   --   K 5.3* 4.7 4.5 4.2 4.2 3.9  CL 98 101  --  96*  --   --    CO2 22 22  --  23  --   --   GLUCOSE 132* 159*  --  123*  --   --   BUN 89* 88*  --  91*  --   --   CREATININE 4.16* 4.15*  --  4.19*  --   --   CALCIUM  9.1 9.0  --  9.1  --   --   MG 2.0  --   --   --   --   --   PHOS 2.9  --   --   --   --   --     Liver Function Tests: Recent Labs  Lab 08/03/23 1424  AST 35  ALT 47*  ALKPHOS 57  BILITOT 1.3*  PROT 7.5  ALBUMIN 3.8   No results for input(s): "LIPASE", "AMYLASE" in the last 168 hours. No results for input(s): "AMMONIA" in the last 168 hours.  CBC: Recent Labs  Lab 08/03/23 1424 08/04/23 0305  WBC 11.6* 5.8  HGB 10.5* 9.8*  HCT 31.9* 29.7*  MCV 88.6 87.6  PLT 155 129*    Cardiac Enzymes: No results for input(s): "CKTOTAL", "CKMB", "CKMBINDEX", "TROPONINI" in the last 168 hours.  BNP: Invalid input(s): "POCBNP"  CBG: Recent Labs  Lab 08/03/23 1652 08/03/23 2023  GLUCAP 142* 165*     Microbiology: Results for orders placed or performed during the hospital encounter of 08/03/23  MRSA Next Gen  by PCR, Nasal     Status: None   Collection Time: 08/03/23  4:52 PM   Specimen: Nasal Mucosa; Nasal Swab  Result Value Ref Range Status   MRSA by PCR Next Gen NOT DETECTED NOT DETECTED Final    Comment: (NOTE) The GeneXpert MRSA Assay (FDA approved for NASAL specimens only), is one component of a comprehensive MRSA colonization surveillance program. It is not intended to diagnose MRSA infection nor to guide or monitor treatment for MRSA infections. Test performance is not FDA approved in patients less than 3 years old. Performed at [facility], [address] [place], [place] [address]     Coagulation Studies: No results for input(s): "LABPROT", "INR" in the last 72 hours.   Urinalysis: No results for input(s): "COLORURINE", "LABSPEC", "PHURINE", "GLUCOSEU", "HGBUR", "BILIRUBINUR", "KETONESUR", "PROTEINUR", "UROBILINOGEN", "NITRITE", "LEUKOCYTESUR" in the last 72 hours.  Invalid input(s):  "APPERANCEUR"    Imaging: DG Chest Portable 1 View Result Date: 08/03/2023 CLINICAL DATA:  Dyspnea. EXAM: PORTABLE CHEST 1 VIEW COMPARISON:  July 11, 2023. FINDINGS: Stable cardiomediastinal silhouette. Both lungs are clear. The visualized skeletal structures are unremarkable. IMPRESSION: No active disease. Electronically Signed   By: Rosalene Colon M.D.   On: 08/03/2023 14:39       Medications:    furosemide  (LASIX ) 200 mg in dextrose  5 % 100 mL (2 mg/mL) infusion 4 mg/hr (08/04/23 1340)     allopurinol   100 mg Oral Daily   amLODipine   5 mg Oral BID   apixaban   2.5 mg Oral BID   Chlorhexidine  Gluconate Cloth  6 each Topical Q0600   cholecalciferol   1,000 Units Oral Daily   cloNIDine   0.1 mg Oral BID   hydrALAZINE   100 mg Oral TID   isosorbide  mononitrate  60 mg Oral Daily   pantoprazole   40 mg Oral Daily   rosuvastatin   10 mg Oral QHS   acetaminophen  **OR** acetaminophen , hydrALAZINE , ipratropium-albuterol , nitroGLYCERIN , ondansetron  **OR** ondansetron  (ZOFRAN ) IV, senna-docusate  Assessment/ Plan:  [name] is a 73 y.o.  male with PMH of HFrEF,CAD, PVD, CVA, COPD, stage IV CKD who is admitted to [facility] on 06/21/2022 for Hyperkalemia [E87.5] AKI (acute kidney injury) (HCC) [N17.9] Acute on chronic congestive heart failure, unspecified heart failure type (HCC) [I50.9] Acute hypoxic respiratory failure (HCC) [J96.01]  Acute Kidney Injury on chronic kidney disease stage IV 2/2 to acute cardiorenal syndrome: baseline creatinine EGFR of 18.. 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 Renal ultrasound shows atrophic right kidney.  At the last hospitalization the patient had a creatinine clearance of 19 however we suspect that this was an overestimate.  Creatinine currently 4.19 with EGFR of 14.  Patient back on Lasix  drip however we suspect that he may need reinitiation of dialysis and we had this conversation today.  We will discuss further with  vascular surgery.  Continue Lasix  drip for now.  Lab Results  Component Value Date   CREATININE 4.19 (H) 08/04/2023   CREATININE 4.15 (H) 08/03/2023   CREATININE 4.16 (H) 08/03/2023     Intake/Output Summary (Last 24 hours) at 08/04/2023 1504 Last data filed at 08/04/2023 1340 Gross per 24 hour  Intake 520.6 ml  Output 1900 ml  Net -1379.4 ml      Acute on chronic systolic and diastolic heart failure.  Patient restarted on Lasix  drip.  Likely has underlying cardiorenal syndrome as well.   .   3. Anemia with chronic kidney disease: Hemoglobin down to 9.8.  Consider starting the patient on Mircera soon.      LOS: 1 Makai Dumond 5/19/20253:04 PM

## 2023-08-04 NOTE — H&P (View-Only) (Signed)
 Hospital Consult    Reason for Consult:  Dialysis Access Needed Requesting Physician:  Anola King NP  MRN #:  [id number]  History of Present Illness: This is a 73 y.o. male with medical history significant of HFpEF, CAD, PAD, carotid artery stenosis, COPD, CKD stage IV, hyperlipidemia, CVA, who presents to the ED due to shortness of breath.   Gary Mccoy states he has been experiencing shortness of breath for a few days now in addition to increasing lower extremity swelling.  He endorses a productive cough but denies any fever, chills, congestion, nausea, vomiting, diarrhea.  He feels as though his abdomen has become more distended but denies any abdominal pain.  He denies any sick contacts.  He denies any palpitations or chest pain.  Upon workup in the ED the patient was hypertensive at 179/114 with a heart rate of 86.  He was saturating 100% on BiPAP.  White blood cell count was 17.1, BUN 87, creatinine 3.63 with a GFR of 17.  BNP was also 1876.  Vascular surgery was consulted by nephrology this morning for dialysis permacatheter for long-term hemodialysis.  Past Medical History:  Diagnosis Date   Arthritis    lower back   Back pain    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    CHF (congestive heart failure) (HCC)    Chronic kidney disease    Complication of anesthesia    Violent with hallucinations after procedure 10/2015   Coronary artery disease    DDD (degenerative disc disease), lumbar    Duodenal ulcer    Elevated lipids    GERD (gastroesophageal reflux disease)    Leg weakness, bilateral    s/p lumbar surgery   PAD (peripheral artery disease) (HCC)    Polyradiculitis    Stroke (HCC)    Rt lower leg  estimated 10 - 12 stokes in last 5 years   Wears dentures    full upper and lower    Past Surgical History:  Procedure Laterality Date   AORTA - BILATERAL FEMORAL ARTERY BYPASS GRAFT N/A 11/08/2015   Procedure: AORTA BIFEMORAL BYPASS GRAFT;  Surgeon: Jackquelyn Mass, MD;   Location: [facility] ORS;  Service: Vascular;  Laterality: N/A;   BACK SURGERY  2005   CATARACT EXTRACTION W/PHACO Right 02/20/2022   Procedure: CATARACT EXTRACTION PHACO AND INTRAOCULAR LENS PLACEMENT (IOC) RIGHT;  Surgeon: Annell Kidney, MD;  Location: [facility];  Service: Ophthalmology;  Laterality: Right;  5.77 0:52.3   CATARACT EXTRACTION W/PHACO Left 03/06/2022   Procedure: CATARACT EXTRACTION PHACO AND INTRAOCULAR LENS PLACEMENT (IOC) LEFT;  Surgeon: Annell Kidney, MD;  Location: [facility];  Service: Ophthalmology;  Laterality: Left;  5.77 1:07.5   CERVICAL DISC SURGERY  1987   COLONOSCOPY WITH PROPOFOL  N/A 12/30/2017   Procedure: COLONOSCOPY WITH PROPOFOL ;  Surgeon: Toledo, Alphonsus Jeans, MD;  Location: [facility] END[facility];  Service: Gastroenterology;  Laterality: N/A;   COLONOSCOPY WITH PROPOFOL  N/A 02/02/2021   Procedure: COLONOSCOPY WITH PROPOFOL ;  Surgeon: Shane Darling, MD;  Location: [facility];  Service: Endoscopy;  Laterality: N/A;  Melena   ENDARTERECTOMY FEMORAL Bilateral 11/08/2015   Procedure: ENDARTERECTOMY FEMORAL;  Surgeon: Jackquelyn Mass, MD;  Location: [facility];  Service: Vascular;  Laterality: Bilateral;  common, SFA, and profundis  Aortic endarterectomy    ESOPHAGOGASTRODUODENOSCOPY (EGD) WITH PROPOFOL  N/A 09/04/2015   Procedure: ESOPHAGOGASTRODUODENOSCOPY (EGD) WITH PROPOFOL ;  Surgeon: Marnee Sink, MD;  Location: [facility];  Service: Endoscopy;  Laterality: N/A;   ESOPHAGOGASTRODUODENOSCOPY (EGD) WITH PROPOFOL  N/A 02/02/2021  Procedure: ESOPHAGOGASTRODUODENOSCOPY (EGD) WITH PROPOFOL ;  Surgeon: Shane Darling, MD;  Location: [facility];  Service: Endoscopy;  Laterality: N/A;  Melena   LUMBAR LAMINECTOMY  2006   RIGHT/LEFT HEART CATH AND CORONARY ANGIOGRAPHY Bilateral 03/28/2021   Procedure: RIGHT/LEFT HEART CATH AND CORONARY ANGIOGRAPHY;  Surgeon: Antonette Batters, MD;  Location: [facility];  Service:  Cardiovascular;  Laterality: Bilateral;   SEPTOPLASTY N/A 09/28/2020   Procedure: SEPTOPLASTY;  Surgeon: Mellody Sprout, MD;  Location: [facility];  Service: ENT;  Laterality: N/A;   TEE WITHOUT CARDIOVERSION N/A 09/22/2015   Procedure: TRANSESOPHAGEAL ECHOCARDIOGRAM (TEE);  Surgeon: Ronney Cola, MD;  Location: [facility];  Service: Cardiovascular;  Laterality: N/A;   TEMPORARY DIALYSIS CATHETER N/A 07/16/2023   Procedure: TEMPORARY DIALYSIS CATHETER;  Surgeon: Celso College, MD;  Location: [facility] CV [facility];  Service: Cardiovascular;  Laterality: N/A;   TURBINATE REDUCTION Bilateral 09/28/2020   Procedure: TURBINATE REDUCTION;  Surgeon: Mellody Sprout, MD;  Location: [facility];  Service: ENT;  Laterality: Bilateral;    No Known Allergies  Prior to Admission medications   Medication Sig Start Date End Date Taking? Authorizing Provider  acetaminophen  (TYLENOL ) 500 MG tablet Take 500 mg by mouth every 6 (six) hours as needed.    [provider]  albuterol  (VENTOLIN  HFA) 108 (90 Base) MCG/ACT inhaler Inhale 2 puffs into the lungs every 6 (six) hours as needed for wheezing or shortness of breath. 06/20/22   Patel, Sona, MD  allopurinol  (ZYLOPRIM ) 100 MG tablet Take 100 mg by mouth daily. 09/09/22 09/09/23  [provider]  amLODipine  (NORVASC ) 5 MG tablet Take 5 mg by mouth 2 (two) times daily.    [provider]  apixaban  (ELIQUIS ) 2.5 MG TABS tablet Take 2.5 mg by mouth 2 (two) times daily. 03/29/22   [provider]  carvedilol  (COREG ) 25 MG tablet Take 1 tablet (25 mg total) by mouth 2 (two) times daily with a meal. 07/24/23   Sheril Dines, MD  Cholecalciferol  (VITAMIN D -1000 MAX ST) 25 MCG (1000 UT) tablet Take 1,000 Units by mouth daily.    [provider]  cloNIDine  (CATAPRES ) 0.1 MG tablet Take 0.1 mg by mouth 2 (two) times daily. 06/27/22   [provider]  Coenzyme Q10 (COQ10) 200 MG CAPS Take 200 mg by mouth daily.     [provider]  hydrALAZINE  (APRESOLINE ) 100 MG tablet Take 100 mg by mouth 3 (three) times daily.    [provider]  Iron-Vitamin C (VITRON-C) 65-125 MG TABS Take 1 tablet by mouth daily.    [provider]  isosorbide  dinitrate (ISORDIL ) 40 MG tablet Take 1 tablet (40 mg total) by mouth 3 (three) times daily. 06/20/22   Patel, Sona, MD  magnesium  oxide (MAG-OX) 400 MG tablet Take 400 mg by mouth daily.    [provider]  Multiple Vitamins-Minerals (MULTIVITAMIN ADULT PO) Take 1 tablet by mouth daily. Men    [provider]  nitroGLYCERIN  (NITROSTAT ) 0.4 MG SL tablet Place 1 tablet (0.4 mg total) under the tongue every 5 (five) minutes x 3 doses as needed for chest pain. 06/20/22   Patel, Sona, MD  Omega-3 Fatty Acids (FISH OIL PO) Take by mouth.    [provider]  pantoprazole  (PROTONIX ) 40 MG tablet Take 40 mg by mouth daily. 03/26/22   [provider]  potassium chloride  SA (KLOR-CON  M) 20 MEQ tablet Take 1 tablet (20 mEq total) by mouth daily. 07/24/23  Sheril Dines, MD  rosuvastatin  (CRESTOR ) 40 MG tablet Take 20 mg by mouth daily.    [provider]  vitamin B-12 (CYANOCOBALAMIN ) 1000 MCG tablet Take 1,000 mcg by mouth daily.    [provider]    Social History   Socioeconomic History   Marital status: Married    Spouse name: Not on file   Number of children: Not on file   Years of education: Not on file   Highest education level: Not on file  Occupational History   Not on file  Tobacco Use   Smoking status: Every Day    Current packs/day: 1.00    Average packs/day: 1 pack/day for 45.0 years (45.0 ttl pk-yrs)    Types: Cigarettes   Smokeless tobacco: Never   Tobacco comments:    05/17/21- currently 0.5 PPD  Vaping Use   Vaping status: Never Used  Substance and Sexual Activity   Alcohol use: Yes    Comment: rare 3 a year   Drug use: No   Sexual activity: Not Currently  Other Topics Concern    Not on file  Social History Narrative   Not on file   Social Drivers of Health   Financial Resource Strain: Low Risk  (02/20/2023)   Received from [facility]   Overall Financial Resource Strain (CARDIA)    Difficulty of Paying Living Expenses: Not hard at all  Food Insecurity: No Food Insecurity (08/03/2023)   Hunger Vital Sign    Worried About Running Out of Food in the Last Year: Never true    Ran Out of Food in the Last Year: Never true  Transportation Needs: No Transportation Needs (08/03/2023)   PRAPARE - Administrator, Civil Service (Medical): No    Lack of Transportation (Non-Medical): No  Physical Activity: Not on file  Stress: Not on file  Social Connections: Moderately Integrated (08/03/2023)   Social Connection and Isolation Panel [NHANES]    Frequency of Communication with Friends and Family: More than three times a week    Frequency of Social Gatherings with Friends and Family: More than three times a week    Attends Religious Services: More than 4 times per year    Active Member of [employer] or Organizations: No    Attends Banker Meetings: Never    Marital Status: Married  Catering manager Violence: Not At Risk (08/03/2023)   Humiliation, Afraid, Rape, and Kick questionnaire    Fear of Current or Ex-Partner: No    Emotionally Abused: No    Physically Abused: No    Sexually Abused: No     Family History  Problem Relation Age of Onset   Heart attack Mother    Hypertension Mother    Varicose Veins Mother    Diabetes Father    Heart attack Father    Heart attack Maternal Grandmother    Stroke Maternal Grandmother     ROS: Otherwise negative unless mentioned in HPI  Physical Examination  Vitals:   08/04/23 1500 08/04/23 1558  BP: (!) 150/74   Pulse: 70   Resp: 13   Temp:  (!) 97.4 F (36.3 C)  SpO2: 98%    Body mass index is 24.14 kg/m.  General:  WDWN in NAD Gait: Not observed HENT: WNL,  normocephalic Pulmonary: normal non-labored breathing, bilateral Rales, without rhonchi,  wheezing Cardiac: regular, without  Murmurs, rubs or gallops; without carotid bruits Abdomen: Positive bowel sounds throughout, soft, NT/ND, no masses Skin: without  rashes Vascular Exam/Pulses: Palpable Pulses throughout and warm to touch.  Extremities: without ischemic changes, without Gangrene , without cellulitis; without open wounds;  Musculoskeletal: no muscle wasting or atrophy  Neurologic: A&O X 3;  No focal weakness or paresthesias are detected; speech is fluent/normal Psychiatric:  The pt has Normal affect. Lymph:  Unremarkable  CBC    Component Value Date/Time   WBC 5.8 08/04/2023 0305   RBC 3.39 (L) 08/04/2023 0305   HGB 9.8 (L) 08/04/2023 0305   HCT 29.7 (L) 08/04/2023 0305   PLT 129 (L) 08/04/2023 0305   MCV 87.6 08/04/2023 0305   MCH 28.9 08/04/2023 0305   MCHC 33.0 08/04/2023 0305   RDW 15.6 (H) 08/04/2023 0305   LYMPHSABS 0.6 (L) 07/15/2023 0433   MONOABS 1.1 (H) 07/15/2023 0433   EOSABS 0.0 07/15/2023 0433   BASOSABS 0.0 07/15/2023 0433    BMET    Component Value Date/Time   NA 133 (L) 08/04/2023 0305   K 3.9 08/04/2023 1524   CL 96 (L) 08/04/2023 0305   CO2 23 08/04/2023 0305   GLUCOSE 123 (H) 08/04/2023 0305   BUN 91 (H) 08/04/2023 0305   CREATININE 4.19 (H) 08/04/2023 0305   CALCIUM  9.1 08/04/2023 0305   GFRNONAA 14 (L) 08/04/2023 0305   GFRAA 48 (L) 02/26/2018 1359    COAGS: Lab Results  Component Value Date   INR 1.2 07/11/2023   INR 1.9 (H) 01/29/2021   INR 2.24 02/26/2018     Non-Invasive Vascular Imaging:   None Ordered  Statin:  Yes.   Beta Blocker:  Yes.   Aspirin :  No. ACEI:  No. ARB:  No. CCB use:  Yes Other antiplatelets/anticoagulants:  Yes.   Eliquis  5 mg BID    ASSESSMENT/PLAN: This is a 73 y.o. male who presents to [facility] emergency department with shortness of breath and bilateral lower extremity swelling.  Patient is well-known  to nephrology here at [facility] and was just discharged couple weeks ago after receiving temporary hemodialysis.  He now returns with a creatinine 3.63 and GFR 17 with a BUN of 87.  Nephrology is requesting dialysis permacatheter for long-term hemodialysis.  Vascular surgery plans on taking the patient to the vascular lab tomorrow on 08/05/2023 for dialysis permacatheter placement for hemodialysis.  Had a long detailed discussion at the bedside this afternoon with the patient regarding the procedure, benefits, risk, and complications.  Patient verbalizes understanding and wishes to proceed.  Answered all questions this afternoon.  Patient will be made n.p.o. after midnight for procedure tomorrow.  I discussed the case in detail with Dr. Devon Fogo MD and he agrees with the plan.   Annamaria Barrette [facility] 08/04/2023 4:10 PM

## 2023-08-04 NOTE — Plan of Care (Signed)
  Problem: Education: Goal: Knowledge of General Education information will improve Description: Including pain rating scale, medication(s)/side effects and non-pharmacologic comfort measures Outcome: Progressing   Problem: Health Behavior/Discharge Planning: Goal: Ability to manage health-related needs will improve Outcome: Progressing   Problem: Clinical Measurements: Goal: Ability to maintain clinical measurements within normal limits will improve Outcome: Progressing Goal: Will remain free from infection Outcome: Progressing Goal: Diagnostic test results will improve Outcome: Progressing Goal: Respiratory complications will improve Outcome: Progressing   Problem: Activity: Goal: Risk for activity intolerance will decrease Outcome: Progressing   Problem: Nutrition: Goal: Adequate nutrition will be maintained Outcome: Progressing   Problem: Coping: Goal: Level of anxiety will decrease Outcome: Progressing   Problem: Elimination: Goal: Will not experience complications related to urinary retention Outcome: Progressing   Problem: Pain Managment: Goal: General experience of comfort will improve and/or be controlled Outcome: Progressing   Problem: Safety: Goal: Ability to remain free from injury will improve Outcome: Progressing   Problem: Skin Integrity: Goal: Risk for impaired skin integrity will decrease Outcome: Progressing

## 2023-08-04 NOTE — Plan of Care (Signed)

## 2023-08-04 NOTE — Progress Notes (Signed)
 Heart Failure Navigator Progress Note  Assessed for Heart & Vascular TOC clinic readiness.  Patient does not meet criteria due to current [facility] patient.   Navigator will sign off at this time.  Roxy Horseman, RN, BSN [facility] Heart Failure Navigator Secure Chat Only

## 2023-08-04 NOTE — Progress Notes (Addendum)
 PROGRESS NOTE    Gary Mccoy.  XBJ:[id number] DOB: 02-21-1951 DOA: 08/03/2023 PCP: Sari Cunning, MD    Brief Narrative:  73 year old male with history of combined heart failure, COPD, CKD stage IV, hypertension, hyperlipidemia, history of CVA, dilated cardiomyopathy, history of CAD status post PCI with DES in 2023, PAD, on oral anticoagulation, who presents emergency department for chief concerns of shortness of breath.   Furosemide  80 mg IV one-time dose, DuoNebs one-time treatment.  EDP discussed with nephrology, who is aware of patient and recommends furosemide  gtt. as well.   Per ED provider, patient has been admitted for similar in the past and had apparent plans for CRRT.  Patient does not have hemodialysis access at this time.  Assessment & Plan:   Principal Problem:   Acute hypoxic respiratory failure (HCC) Active Problems:   Acute on chronic respiratory failure with hypoxia (HCC)   Acute renal failure superimposed on stage 4 chronic kidney disease (HCC)   PAD s/p aortobifemoral bypass 2017 (peripheral artery disease)   Benign essential HTN   Leukocytosis   Hyperlipidemia   CVA (cerebral vascular accident) (HCC)   Acute exacerbation of CHF (congestive heart failure) (HCC)   CKD (chronic kidney disease) stage 4, GFR 15-29 ml/min (HCC)   CAD S/P percutaneous coronary angioplasty  Acute hypoxic respiratory failure (HCC) Suspect secondary to heart failure exacerbation BPAP weaned off Currently on 2L Plan: Transfer to floor Wean oxygen as tolerated Continue BiPAP therapy Pulse oximetry continuously ordered on admission   Acute renal failure superimposed on stage 4 chronic kidney disease (HCC) Suspect cardiorenal Baseline serum creatinine is 2.64-3.52 over the last 3 weeks Status post furosemide  80 mg IV per EDP Plan: Continue lasix  gtt Strict I's and O's Recheck BMP in the a.m.  Hyperlipidemia Continue statin   Leukocytosis Mild, and appears to be  relatively chronically elevated over the last 3 weeks Continue treatment per above Check CBC in a.m. Defer abx   Benign essential HTN Continue home Imdur  Continue home hydralazine  Continue home amlodipine  Continue home clonidine  As needed IV hydralazine   DVT prophylaxis: Eliquis  Code Status: FULL Family Communication:None Disposition Plan: Status is: Inpatient Remains inpatient appropriate because: Multiple acute issues as above   Level of care: Telemetry Medical  Consultants:  Nephrology  Procedures:  None  Antimicrobials: None   Subjective: Seen and examined.  Resting comfortably in bed.  No visible distress.  No complaints of pain.  Objective: Vitals:   08/04/23 1133 08/04/23 1200 08/04/23 1300 08/04/23 1400  BP:  (!) 161/64 (!) 151/65 (!) 157/67  Pulse:  77 75 78  Resp:  16 14 20   Temp: (!) 97.3 F (36.3 C)     TempSrc:      SpO2:  99% 97% 94%  Weight:      Height:        Intake/Output Summary (Last 24 hours) at 08/04/2023 1500 Last data filed at 08/04/2023 1340 Gross per 24 hour  Intake 520.6 ml  Output 1900 ml  Net -1379.4 ml   Filed Weights   08/03/23 1421 08/03/23 1650  Weight: 88.8 kg 83 kg    Examination:  General exam: Appears calm and comfortable  Respiratory system: Clear to auscultation. Respiratory effort normal. Cardiovascular system: S1-S2, RRR, no murmurs, 2+ pitting edema BLE Gastrointestinal system: Soft, NT/ND, edematous Central nervous system: Alert and oriented. No focal neurological deficits. Extremities: Symmetric 5 x 5 power. Skin: No rashes, lesions or ulcers Psychiatry: Judgement and insight appear normal. Mood &  affect appropriate.     Data Reviewed: I have personally reviewed following labs and imaging studies  CBC: Recent Labs  Lab 08/03/23 1424 08/04/23 0305  WBC 11.6* 5.8  HGB 10.5* 9.8*  HCT 31.9* 29.7*  MCV 88.6 87.6  PLT 155 129*   Basic Metabolic Panel: Recent Labs  Lab 08/03/23 1424  08/03/23 1954 08/03/23 2256 08/04/23 0305 08/04/23 0742 08/04/23 1126  NA 133* 135  --  133*  --   --   K 5.3* 4.7 4.5 4.2 4.2 3.9  CL 98 101  --  96*  --   --   CO2 22 22  --  23  --   --   GLUCOSE 132* 159*  --  123*  --   --   BUN 89* 88*  --  91*  --   --   CREATININE 4.16* 4.15*  --  4.19*  --   --   CALCIUM  9.1 9.0  --  9.1  --   --   MG 2.0  --   --   --   --   --   PHOS 2.9  --   --   --   --   --    GFR: Estimated Creatinine Clearance: 18.3 mL/min (A) (by C-G formula based on SCr of 4.19 mg/dL (H)). Liver Function Tests: Recent Labs  Lab 08/03/23 1424  AST 35  ALT 47*  ALKPHOS 57  BILITOT 1.3*  PROT 7.5  ALBUMIN 3.8   No results for input(s): "LIPASE", "AMYLASE" in the last 168 hours. No results for input(s): "AMMONIA" in the last 168 hours. Coagulation Profile: No results for input(s): "INR", "PROTIME" in the last 168 hours. Cardiac Enzymes: No results for input(s): "CKTOTAL", "CKMB", "CKMBINDEX", "TROPONINI" in the last 168 hours. BNP (last 3 results) No results for input(s): "PROBNP" in the last 8760 hours. HbA1C: No results for input(s): "HGBA1C" in the last 72 hours. CBG: Recent Labs  Lab 08/03/23 1652 08/03/23 2023  GLUCAP 142* 165*   Lipid Profile: No results for input(s): "CHOL", "HDL", "LDLCALC", "TRIG", "CHOLHDL", "LDLDIRECT" in the last 72 hours. Thyroid  Function Tests: No results for input(s): "TSH", "T4TOTAL", "FREET4", "T3FREE", "THYROIDAB" in the last 72 hours. Anemia Panel: No results for input(s): "VITAMINB12", "FOLATE", "FERRITIN", "TIBC", "IRON", "RETICCTPCT" in the last 72 hours. Sepsis Labs: No results for input(s): "PROCALCITON", "LATICACIDVEN" in the last 168 hours.  Recent Results (from the past 240 hours)  MRSA Next Gen by PCR, Nasal     Status: None   Collection Time: 08/03/23  4:52 PM   Specimen: Nasal Mucosa; Nasal Swab  Result Value Ref Range Status   MRSA by PCR Next Gen NOT DETECTED NOT DETECTED Final    Comment:  (NOTE) The GeneXpert MRSA Assay (FDA approved for NASAL specimens only), is one component of a comprehensive MRSA colonization surveillance program. It is not intended to diagnose MRSA infection nor to guide or monitor treatment for MRSA infections. Test performance is not FDA approved in patients less than 110 years old. Performed at [facility], [address], [place], [place] [address]          Radiology Studies: DG Chest Portable 1 View Result Date: 08/03/2023 CLINICAL DATA:  Dyspnea. EXAM: PORTABLE CHEST 1 VIEW COMPARISON:  July 11, 2023. FINDINGS: Stable cardiomediastinal silhouette. Both lungs are clear. The visualized skeletal structures are unremarkable. IMPRESSION: No active disease. Electronically Signed   By: Rosalene Colon M.D.   On: 08/03/2023 14:39  Scheduled Meds:  allopurinol   100 mg Oral Daily   amLODipine   5 mg Oral BID   apixaban   2.5 mg Oral BID   Chlorhexidine  Gluconate Cloth  6 each Topical Q0600   cholecalciferol   1,000 Units Oral Daily   cloNIDine   0.1 mg Oral BID   hydrALAZINE   100 mg Oral TID   isosorbide  mononitrate  60 mg Oral Daily   pantoprazole   40 mg Oral Daily   rosuvastatin   10 mg Oral QHS   Continuous Infusions:  furosemide  (LASIX ) 200 mg in dextrose  5 % 100 mL (2 mg/mL) infusion 4 mg/hr (08/04/23 1340)     LOS: 1 day    Tiajuana Fluke, MD [facility]  CRITICAL CARE Performed by: Tiajuana Fluke   Total critical care time: 35 minutes  Critical care time was exclusive of separately billable procedures and treating other patients.  Critical care was necessary to treat or prevent imminent or life-threatening deterioration.  Critical care was time spent personally by me on the following activities: development of treatment plan with patient and/or surrogate as well as nursing, discussions with consultants, evaluation of patient's response to treatment, examination of patient, obtaining history  from patient or surrogate, ordering and performing treatments and interventions, ordering and review of laboratory studies, ordering and review of radiographic studies, pulse oximetry and re-evaluation of patient's condition.   If 7PM-7AM, please contact night-coverage  08/04/2023, 3:00 PM

## 2023-08-04 NOTE — Plan of Care (Signed)
  Problem: Education: Goal: Knowledge of General Education information will improve Description: Including pain rating scale, medication(s)/side effects and non-pharmacologic comfort measures Outcome: Progressing   Problem: Clinical Measurements: Goal: Ability to maintain clinical measurements within normal limits will improve Outcome: Progressing Goal: Diagnostic test results will improve Outcome: Progressing   Problem: Pain Managment: Goal: General experience of comfort will improve and/or be controlled Outcome: Progressing   Problem: Safety: Goal: Ability to remain free from injury will improve Outcome: Progressing

## 2023-08-04 NOTE — Consult Note (Signed)
 Hospital Consult    Reason for Consult:  Dialysis Access Needed Requesting Physician:  Anola King NP  MRN #:  [id number]  History of Present Illness: This is a 73 y.o. male with medical history significant of HFpEF, CAD, PAD, carotid artery stenosis, COPD, CKD stage IV, hyperlipidemia, CVA, who presents to the ED due to shortness of breath.   Mr. Heavin states he has been experiencing shortness of breath for a few days now in addition to increasing lower extremity swelling.  He endorses a productive cough but denies any fever, chills, congestion, nausea, vomiting, diarrhea.  He feels as though his abdomen has become more distended but denies any abdominal pain.  He denies any sick contacts.  He denies any palpitations or chest pain.  Upon workup in the ED the patient was hypertensive at 179/114 with a heart rate of 86.  He was saturating 100% on BiPAP.  White blood cell count was 17.1, BUN 87, creatinine 3.63 with a GFR of 17.  BNP was also 1876.  Vascular surgery was consulted by nephrology this morning for dialysis permacatheter for long-term hemodialysis.  Past Medical History:  Diagnosis Date   Arthritis    lower back   Back pain    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    CHF (congestive heart failure) (HCC)    Chronic kidney disease    Complication of anesthesia    Violent with hallucinations after procedure 10/2015   Coronary artery disease    DDD (degenerative disc disease), lumbar    Duodenal ulcer    Elevated lipids    GERD (gastroesophageal reflux disease)    Leg weakness, bilateral    s/p lumbar surgery   PAD (peripheral artery disease) (HCC)    Polyradiculitis    Stroke (HCC)    Rt lower leg  estimated 10 - 12 stokes in last 5 years   Wears dentures    full upper and lower    Past Surgical History:  Procedure Laterality Date   AORTA - BILATERAL FEMORAL ARTERY BYPASS GRAFT N/A 11/08/2015   Procedure: AORTA BIFEMORAL BYPASS GRAFT;  Surgeon: Jackquelyn Mass, MD;   Location: [facility];  Service: Vascular;  Laterality: N/A;   BACK SURGERY  2005   CATARACT EXTRACTION W/PHACO Right 02/20/2022   Procedure: CATARACT EXTRACTION PHACO AND INTRAOCULAR LENS PLACEMENT (IOC) RIGHT;  Surgeon: Annell Kidney, MD;  Location: [facility];  Service: Ophthalmology;  Laterality: Right;  5.77 0:52.3   CATARACT EXTRACTION W/PHACO Left 03/06/2022   Procedure: CATARACT EXTRACTION PHACO AND INTRAOCULAR LENS PLACEMENT (IOC) LEFT;  Surgeon: Annell Kidney, MD;  Location: [facility];  Service: Ophthalmology;  Laterality: Left;  5.77 1:07.5   CERVICAL DISC SURGERY  1987   COLONOSCOPY WITH PROPOFOL  N/A 12/30/2017   Procedure: COLONOSCOPY WITH PROPOFOL ;  Surgeon: Toledo, Alphonsus Jeans, MD;  Location: [facility];  Service: Gastroenterology;  Laterality: N/A;   COLONOSCOPY WITH PROPOFOL  N/A 02/02/2021   Procedure: COLONOSCOPY WITH PROPOFOL ;  Surgeon: Shane Darling, MD;  Location: [facility];  Service: Endoscopy;  Laterality: N/A;  Melena   ENDARTERECTOMY FEMORAL Bilateral 11/08/2015   Procedure: ENDARTERECTOMY FEMORAL;  Surgeon: Jackquelyn Mass, MD;  Location: [facility];  Service: Vascular;  Laterality: Bilateral;  common, SFA, and profundis  Aortic endarterectomy    ESOPHAGOGASTRODUODENOSCOPY (EGD) WITH PROPOFOL  N/A 09/04/2015   Procedure: ESOPHAGOGASTRODUODENOSCOPY (EGD) WITH PROPOFOL ;  Surgeon: Marnee Sink, MD;  Location: [facility];  Service: Endoscopy;  Laterality: N/A;   ESOPHAGOGASTRODUODENOSCOPY (EGD) WITH PROPOFOL  N/A 02/02/2021  Procedure: ESOPHAGOGASTRODUODENOSCOPY (EGD) WITH PROPOFOL ;  Surgeon: Shane Darling, MD;  Location: [facility];  Service: Endoscopy;  Laterality: N/A;  Melena   LUMBAR LAMINECTOMY  2006   RIGHT/LEFT HEART CATH AND CORONARY ANGIOGRAPHY Bilateral 03/28/2021   Procedure: RIGHT/LEFT HEART CATH AND CORONARY ANGIOGRAPHY;  Surgeon: Antonette Batters, MD;  Location: [facility];  Service:  Cardiovascular;  Laterality: Bilateral;   SEPTOPLASTY N/A 09/28/2020   Procedure: SEPTOPLASTY;  Surgeon: Mellody Sprout, MD;  Location: [facility];  Service: ENT;  Laterality: N/A;   TEE WITHOUT CARDIOVERSION N/A 09/22/2015   Procedure: TRANSESOPHAGEAL ECHOCARDIOGRAM (TEE);  Surgeon: Ronney Cola, MD;  Location: [facility];  Service: Cardiovascular;  Laterality: N/A;   TEMPORARY DIALYSIS CATHETER N/A 07/16/2023   Procedure: TEMPORARY DIALYSIS CATHETER;  Surgeon: Celso College, MD;  Location: [facility];  Service: Cardiovascular;  Laterality: N/A;   TURBINATE REDUCTION Bilateral 09/28/2020   Procedure: TURBINATE REDUCTION;  Surgeon: Mellody Sprout, MD;  Location: [facility];  Service: ENT;  Laterality: Bilateral;    No Known Allergies  Prior to Admission medications   Medication Sig Start Date End Date Taking? Authorizing Provider  acetaminophen  (TYLENOL ) 500 MG tablet Take 500 mg by mouth every 6 (six) hours as needed.    [provider]  albuterol  (VENTOLIN  HFA) 108 (90 Base) MCG/ACT inhaler Inhale 2 puffs into the lungs every 6 (six) hours as needed for wheezing or shortness of breath. 06/20/22   Patel, Sona, MD  allopurinol  (ZYLOPRIM ) 100 MG tablet Take 100 mg by mouth daily. 09/09/22 09/09/23  [provider]  amLODipine  (NORVASC ) 5 MG tablet Take 5 mg by mouth 2 (two) times daily.    [provider]  apixaban  (ELIQUIS ) 2.5 MG TABS tablet Take 2.5 mg by mouth 2 (two) times daily. 03/29/22   [provider]  carvedilol  (COREG ) 25 MG tablet Take 1 tablet (25 mg total) by mouth 2 (two) times daily with a meal. 07/24/23   Sheril Dines, MD  Cholecalciferol  (VITAMIN D -1000 MAX ST) 25 MCG (1000 UT) tablet Take 1,000 Units by mouth daily.    [provider]  cloNIDine  (CATAPRES ) 0.1 MG tablet Take 0.1 mg by mouth 2 (two) times daily. 06/27/22   [provider]  Coenzyme Q10 (COQ10) 200 MG CAPS Take 200 mg by mouth daily.     [provider]  hydrALAZINE  (APRESOLINE ) 100 MG tablet Take 100 mg by mouth 3 (three) times daily.    [provider]  Iron-Vitamin C (VITRON-C) 65-125 MG TABS Take 1 tablet by mouth daily.    [provider]  isosorbide  dinitrate (ISORDIL ) 40 MG tablet Take 1 tablet (40 mg total) by mouth 3 (three) times daily. 06/20/22   Patel, Sona, MD  magnesium  oxide (MAG-OX) 400 MG tablet Take 400 mg by mouth daily.    [provider]  Multiple Vitamins-Minerals (MULTIVITAMIN ADULT PO) Take 1 tablet by mouth daily. Men    [provider]  nitroGLYCERIN  (NITROSTAT ) 0.4 MG SL tablet Place 1 tablet (0.4 mg total) under the tongue every 5 (five) minutes x 3 doses as needed for chest pain. 06/20/22   Patel, Sona, MD  Omega-3 Fatty Acids (FISH OIL PO) Take by mouth.    [provider]  pantoprazole  (PROTONIX ) 40 MG tablet Take 40 mg by mouth daily. 03/26/22   [provider]  potassium chloride  SA (KLOR-CON  M) 20 MEQ tablet Take 1 tablet (20 mEq total) by mouth daily. 07/24/23  Sheril Dines, MD  rosuvastatin  (CRESTOR ) 40 MG tablet Take 20 mg by mouth daily.    [provider]  vitamin B-12 (CYANOCOBALAMIN ) 1000 MCG tablet Take 1,000 mcg by mouth daily.    [provider]    Social History   Socioeconomic History   Marital status: Married    Spouse name: Not on file   Number of children: Not on file   Years of education: Not on file   Highest education level: Not on file  Occupational History   Not on file  Tobacco Use   Smoking status: Every Day    Current packs/day: 1.00    Average packs/day: 1 pack/day for 45.0 years (45.0 ttl pk-yrs)    Types: Cigarettes   Smokeless tobacco: Never   Tobacco comments:    05/17/21- currently 0.5 PPD  Vaping Use   Vaping status: Never Used  Substance and Sexual Activity   Alcohol use: Yes    Comment: rare 3 a year   Drug use: No   Sexual activity: Not Currently  Other Topics Concern    Not on file  Social History Narrative   Not on file   Social Drivers of Health   Financial Resource Strain: Low Risk  (02/20/2023)   Received from [facility]   Overall Financial Resource Strain (CARDIA)    Difficulty of Paying Living Expenses: Not hard at all  Food Insecurity: No Food Insecurity (08/03/2023)   Hunger Vital Sign    Worried About Running Out of Food in the Last Year: Never true    Ran Out of Food in the Last Year: Never true  Transportation Needs: No Transportation Needs (08/03/2023)   PRAPARE - Administrator, Civil Service (Medical): No    Lack of Transportation (Non-Medical): No  Physical Activity: Not on file  Stress: Not on file  Social Connections: Moderately Integrated (08/03/2023)   Social Connection and Isolation Panel [NHANES]    Frequency of Communication with Friends and Family: More than three times a week    Frequency of Social Gatherings with Friends and Family: More than three times a week    Attends Religious Services: More than 4 times per year    Active Member of [employer] or Organizations: No    Attends Banker Meetings: Never    Marital Status: Married  Catering manager Violence: Not At Risk (08/03/2023)   Humiliation, Afraid, Rape, and Kick questionnaire    Fear of Current or Ex-Partner: No    Emotionally Abused: No    Physically Abused: No    Sexually Abused: No     Family History  Problem Relation Age of Onset   Heart attack Mother    Hypertension Mother    Varicose Veins Mother    Diabetes Father    Heart attack Father    Heart attack Maternal Grandmother    Stroke Maternal Grandmother     ROS: Otherwise negative unless mentioned in HPI  Physical Examination  Vitals:   08/04/23 1500 08/04/23 1558  BP: (!) 150/74   Pulse: 70   Resp: 13   Temp:  (!) 97.4 F (36.3 C)  SpO2: 98%    Body mass index is 24.14 kg/m.  General:  WDWN in NAD Gait: Not observed HENT: WNL,  normocephalic Pulmonary: normal non-labored breathing, bilateral Rales, without rhonchi,  wheezing Cardiac: regular, without  Murmurs, rubs or gallops; without carotid bruits Abdomen: Positive bowel sounds throughout, soft, NT/ND, no masses Skin: without  rashes Vascular Exam/Pulses: Palpable Pulses throughout and warm to touch.  Extremities: without ischemic changes, without Gangrene , without cellulitis; without open wounds;  Musculoskeletal: no muscle wasting or atrophy  Neurologic: A&O X 3;  No focal weakness or paresthesias are detected; speech is fluent/normal Psychiatric:  The pt has Normal affect. Lymph:  Unremarkable  CBC    Component Value Date/Time   WBC 5.8 08/04/2023 0305   RBC 3.39 (L) 08/04/2023 0305   HGB 9.8 (L) 08/04/2023 0305   HCT 29.7 (L) 08/04/2023 0305   PLT 129 (L) 08/04/2023 0305   MCV 87.6 08/04/2023 0305   MCH 28.9 08/04/2023 0305   MCHC 33.0 08/04/2023 0305   RDW 15.6 (H) 08/04/2023 0305   LYMPHSABS 0.6 (L) 07/15/2023 0433   MONOABS 1.1 (H) 07/15/2023 0433   EOSABS 0.0 07/15/2023 0433   BASOSABS 0.0 07/15/2023 0433    BMET    Component Value Date/Time   NA 133 (L) 08/04/2023 0305   K 3.9 08/04/2023 1524   CL 96 (L) 08/04/2023 0305   CO2 23 08/04/2023 0305   GLUCOSE 123 (H) 08/04/2023 0305   BUN 91 (H) 08/04/2023 0305   CREATININE 4.19 (H) 08/04/2023 0305   CALCIUM  9.1 08/04/2023 0305   GFRNONAA 14 (L) 08/04/2023 0305   GFRAA 48 (L) 02/26/2018 1359    COAGS: Lab Results  Component Value Date   INR 1.2 07/11/2023   INR 1.9 (H) 01/29/2021   INR 2.24 02/26/2018     Non-Invasive Vascular Imaging:   None Ordered  Statin:  Yes.   Beta Blocker:  Yes.   Aspirin :  No. ACEI:  No. ARB:  No. CCB use:  Yes Other antiplatelets/anticoagulants:  Yes.   Eliquis  5 mg BID    ASSESSMENT/PLAN: This is a 73 y.o. male who presents to [facility] emergency department with shortness of breath and bilateral lower extremity swelling.  Patient is well-known  to nephrology here at [facility] and was just discharged couple weeks ago after receiving temporary hemodialysis.  He now returns with a creatinine 3.63 and GFR 17 with a BUN of 87.  Nephrology is requesting dialysis permacatheter for long-term hemodialysis.  Vascular surgery plans on taking the patient to the vascular lab tomorrow on 08/05/2023 for dialysis permacatheter placement for hemodialysis.  Had a long detailed discussion at the bedside this afternoon with the patient regarding the procedure, benefits, risk, and complications.  Patient verbalizes understanding and wishes to proceed.  Answered all questions this afternoon.  Patient will be made n.p.o. after midnight for procedure tomorrow.  I discussed the case in detail with Dr. Devon Fogo MD and he agrees with the plan.   Annamaria Barrette [facility] 08/04/2023 4:10 PM

## 2023-08-05 ENCOUNTER — Encounter: Payer: Self-pay | Admitting: Vascular Surgery

## 2023-08-05 ENCOUNTER — Encounter
Admission: EM | Disposition: A | Discharge status: Home / Self Care | Payer: Self-pay | Source: Home / Self Care | Attending: Obstetrics and Gynecology

## 2023-08-05 ENCOUNTER — Encounter (INDEPENDENT_AMBULATORY_CARE_PROVIDER_SITE_OTHER): Payer: Self-pay

## 2023-08-05 DIAGNOSIS — N186 End stage renal disease: Secondary | ICD-10-CM | POA: Diagnosis not present

## 2023-08-05 DIAGNOSIS — S15391A Other specified injury of right internal jugular vein, initial encounter: Secondary | ICD-10-CM | POA: Diagnosis not present

## 2023-08-05 DIAGNOSIS — J9601 Acute respiratory failure with hypoxia: Secondary | ICD-10-CM | POA: Diagnosis not present

## 2023-08-05 DIAGNOSIS — Z992 Dependence on renal dialysis: Secondary | ICD-10-CM | POA: Diagnosis not present

## 2023-08-05 HISTORY — PX: DIALYSIS/PERMA CATHETER INSERTION: CATH118288

## 2023-08-05 LAB — BASIC METABOLIC PANEL WITH GFR
Anion gap: 14 (ref 5–15)
BUN: 109 mg/dL — ABNORMAL HIGH (ref 8–23)
CO2: 26 mmol/L (ref 22–32)
Calcium: 8.9 mg/dL (ref 8.9–10.3)
Chloride: 93 mmol/L — ABNORMAL LOW (ref 98–111)
Creatinine, Ser: 3.88 mg/dL — ABNORMAL HIGH (ref 0.61–1.24)
GFR, Estimated: 16 mL/min — ABNORMAL LOW (ref >60)
Glucose, Bld: 89 mg/dL (ref 70–99)
Potassium: 3 mmol/L — ABNORMAL LOW (ref 3.5–5.1)
Sodium: 133 mmol/L — ABNORMAL LOW (ref 135–145)

## 2023-08-05 LAB — POTASSIUM
Potassium: 3.4 mmol/L — ABNORMAL LOW (ref 3.5–5.1)
Potassium: 3.1 mmol/L — ABNORMAL LOW (ref 3.5–5.1)

## 2023-08-05 SURGERY — DIALYSIS/PERMA CATHETER INSERTION
Anesthesia: Moderate Sedation

## 2023-08-05 MED ORDER — MIDAZOLAM HCL 2 MG/2ML IJ SOLN
INTRAMUSCULAR | Status: DC | PRN
  Administered 2023-08-05 (×3): 1 mg via INTRAVENOUS
  Administered 2023-08-05 (×2): .5 mg via INTRAVENOUS
  Administered 2023-08-05 (×2): 1 mg via INTRAVENOUS

## 2023-08-05 MED ORDER — IODIXANOL 320 MG/ML IV SOLN
INTRAVENOUS | Status: DC | PRN
  Administered 2023-08-05: 50 mL

## 2023-08-05 MED ORDER — MIDAZOLAM HCL 2 MG/2ML IJ SOLN
INTRAMUSCULAR | Status: AC
  Filled 2023-08-05: qty 2

## 2023-08-05 MED ORDER — CEFAZOLIN SODIUM-DEXTROSE 1-4 GM/50ML-% IV SOLN
INTRAVENOUS | Status: AC
  Filled 2023-08-05: qty 50

## 2023-08-05 MED ORDER — HEPARIN (PORCINE) IN NACL 2000-0.9 UNIT/L-% IV SOLN
INTRAVENOUS | Status: DC | PRN
  Administered 2023-08-05: 1000 mL

## 2023-08-05 MED ORDER — FENTANYL CITRATE PF 50 MCG/ML IJ SOSY
PREFILLED_SYRINGE | INTRAMUSCULAR | Status: AC
  Filled 2023-08-05: qty 1

## 2023-08-05 MED ORDER — MIDAZOLAM HCL 2 MG/ML PO SYRP
8.0000 mg | ORAL_SOLUTION | Freq: Once | ORAL | Status: DC | PRN
  Filled 2023-08-05: qty 5

## 2023-08-05 MED ORDER — FENTANYL CITRATE (PF) 100 MCG/2ML IJ SOLN
INTRAMUSCULAR | Status: AC
  Filled 2023-08-05: qty 2

## 2023-08-05 MED ORDER — METHYLPREDNISOLONE SODIUM SUCC 125 MG IJ SOLR: 125.0000 mg | Freq: Once | INTRAMUSCULAR | Status: DC | PRN

## 2023-08-05 MED ORDER — DIPHENHYDRAMINE HCL 50 MG/ML IJ SOLN
50.0000 mg | Freq: Once | INTRAMUSCULAR | Status: DC | PRN
Start: 2023-08-05 — End: 2023-08-05

## 2023-08-05 MED ORDER — FENTANYL CITRATE (PF) 100 MCG/2ML IJ SOLN
INTRAMUSCULAR | Status: DC | PRN
  Administered 2023-08-05: 50 ug via INTRAVENOUS
  Administered 2023-08-05: 25 ug via INTRAVENOUS
  Administered 2023-08-05: 50 ug via INTRAVENOUS
  Administered 2023-08-05: 25 ug via INTRAVENOUS
  Administered 2023-08-05: 50 ug via INTRAVENOUS
  Administered 2023-08-05 (×2): 25 ug via INTRAVENOUS

## 2023-08-05 MED ORDER — HEPARIN SODIUM (PORCINE) 10000 UNIT/ML IJ SOLN
INTRAMUSCULAR | Status: AC
Start: 2023-08-05 — End: ?
  Filled 2023-08-05: qty 1

## 2023-08-05 MED ORDER — SODIUM CHLORIDE 0.9 % IV SOLN: INTRAVENOUS | Status: DC

## 2023-08-05 MED ORDER — CEFAZOLIN SODIUM-DEXTROSE 1-4 GM/50ML-% IV SOLN
1.0000 g | INTRAVENOUS | Status: AC
  Administered 2023-08-05: 1 g via INTRAVENOUS
  Filled 2023-08-05: qty 50

## 2023-08-05 MED ORDER — CEFAZOLIN SODIUM-DEXTROSE 1-4 GM/50ML-% IV SOLN
1.0000 g | INTRAVENOUS | Status: DC
  Filled 2023-08-05: qty 50

## 2023-08-05 MED ORDER — HEPARIN SODIUM (PORCINE) 10000 UNIT/ML IJ SOLN
INTRAMUSCULAR | Status: DC | PRN
  Administered 2023-08-05: 10000 [IU]

## 2023-08-05 MED ORDER — HEPARIN SODIUM (PORCINE) 1000 UNIT/ML IJ SOLN
INTRAMUSCULAR | Status: AC
  Filled 2023-08-05: qty 10

## 2023-08-05 MED ORDER — LIDOCAINE-EPINEPHRINE (PF) 1 %-1:200000 IJ SOLN
INTRAMUSCULAR | Status: DC | PRN
  Administered 2023-08-05: 30 mL via INTRADERMAL

## 2023-08-05 MED ORDER — FAMOTIDINE 20 MG PO TABS: 40.0000 mg | ORAL_TABLET | Freq: Once | ORAL | Status: DC | PRN

## 2023-08-05 SURGICAL SUPPLY — 22 items
BALLOON ARMADA 10X40X80 (BALLOONS) IMPLANT
BALLOON ARMADA 14X40X80 (BALLOONS) IMPLANT
BALLOON ULTRVRSE 10X40X75 (BALLOONS) IMPLANT
CATH ANGIO 5F PIGTAIL 100CM (CATHETERS) IMPLANT
CATH BEACON 5 .035 65 KMP TIP (CATHETERS) IMPLANT
CATH CANNON HEMO 15FR 19 (HEMODIALYSIS SUPPLIES) IMPLANT
CLOSURE PERCLOSE PROSTYLE (VASCULAR PRODUCTS) IMPLANT
COVER PROBE ULTRASOUND 5X96 (MISCELLANEOUS) IMPLANT
DERMABOND ADVANCED .7 DNX12 (GAUZE/BANDAGES/DRESSINGS) IMPLANT
DEVICE PRESTO INFLATION (MISCELLANEOUS) IMPLANT
GLIDEWIRE ADV .035X180CM (WIRE) IMPLANT
GOWN STRL REUS W/ TWL LRG LVL3 (GOWN DISPOSABLE) ×1 IMPLANT
GUIDEWIRE SUPER STIFF .035X180 (WIRE) IMPLANT
INTRODUCER PERFORM 12FR X13 (SHEATH) IMPLANT
NDL ENTRY 21GA 7CM ECHOTIP (NEEDLE) IMPLANT
NEEDLE ENTRY 21GA 7CM ECHOTIP (NEEDLE) ×1 IMPLANT
PACK ANGIOGRAPHY (CUSTOM PROCEDURE TRAY) ×1 IMPLANT
SET INTRO CAPELLA COAXIAL (SET/KITS/TRAYS/PACK) IMPLANT
SHEATH BRITE TIP 10FRX11 (SHEATH) IMPLANT
SHEATH BRITE TIP 6FRX11 (SHEATH) IMPLANT
SUT MNCRL AB 4-0 PS2 18 (SUTURE) IMPLANT
SUT SILK 0 FSL (SUTURE) IMPLANT

## 2023-08-05 NOTE — Interval H&P Note (Signed)
 History and Physical Interval Note:  08/05/2023 12:11 PM  Gary Mccoy.  has presented today for surgery, with the diagnosis of ESRD.  The various methods of treatment have been discussed with the patient and family. After consideration of risks, benefits and other options for treatment, the patient has consented to  Procedure(s): DIALYSIS/PERMA CATHETER INSERTION (N/A) as a surgical intervention.  [name] history has been reviewed, patient examined, no change in status, stable for surgery.  I have reviewed the patient's chart and labs.  Questions were answered to the patient's satisfaction.     Devon Fogo

## 2023-08-05 NOTE — Progress Notes (Signed)
 PROGRESS NOTE    Gary Mccoy.  ZOX:[id number] DOB: [birth year] DOA: 08/03/2023 PCP: Sari Cunning, MD    Brief Narrative:  73 year old male with history of combined heart failure, COPD, CKD stage IV, hypertension, hyperlipidemia, history of CVA, dilated cardiomyopathy, history of CAD status post PCI with DES in 2023, PAD, on oral anticoagulation, who presents emergency department for chief concerns of shortness of breath.   Furosemide  80 mg IV one-time dose, DuoNebs one-time treatment.  EDP discussed with nephrology, who is aware of patient and recommends furosemide  gtt. as well.   Per ED provider, patient has been admitted for similar in the past and had apparent plans for CRRT.  Patient does not have hemodialysis access at this time.  Assessment & Plan:   Principal Problem:   Acute hypoxic respiratory failure (HCC) Active Problems:   Acute on chronic respiratory failure with hypoxia (HCC)   Acute renal failure superimposed on stage 4 chronic kidney disease (HCC)   PAD s/p aortobifemoral bypass 2017 (peripheral artery disease)   Benign essential HTN   Leukocytosis   Hyperlipidemia   CVA (cerebral vascular accident) (HCC)   Acute exacerbation of CHF (congestive heart failure) (HCC)   CKD (chronic kidney disease) stage 4, GFR 15-29 ml/min (HCC)   CAD S/P percutaneous coronary angioplasty  Acute hypoxic respiratory failure (HCC) Suspect secondary to heart failure exacerbation BPAP weaned off Currently on 2L, saturating adequately Plan: Vitals per unit protocol   Acute renal failure superimposed on stage 4 chronic kidney disease (HCC) Progressed to ESRD Plan: PermCath placement with vascular surgery today.  Dialysis afterwards.  Hyperlipidemia Continue statin   Leukocytosis Mild, and appears to be relatively chronically elevated over the last 3 weeks Continue treatment per above Check CBC in a.m. Defer abx   Benign essential HTN Continue home Imdur  Continue  home hydralazine  Continue home amlodipine  Continue home clonidine  As needed IV hydralazine   DVT prophylaxis: Eliquis  Code Status: FULL Family Communication:None Disposition Plan: Status is: Inpatient Remains inpatient appropriate because: Multiple acute issues as above   Level of care: Telemetry Medical  Consultants:  Nephrology Vascular surgery  Procedures:  Dialysis catheter placement  Antimicrobials: None   Subjective: Seen and examined resting in bed.  No complaints of pain.  Objective: Vitals:   08/05/23 1423 08/05/23 1430 08/05/23 1445 08/05/23 1500  BP: (!) 172/72 (!) 173/73 (!) 164/79 (!) 167/77  Pulse: 66 66 (!) 58 (!) 57  Resp: 12 (!) 9 10 11   Temp:  (!) 97.2 F (36.2 C)    TempSrc:  Oral    SpO2: 90% 91% 93% 93%  Weight:      Height:        Intake/Output Summary (Last 24 hours) at 08/05/2023 1519 Last data filed at 08/05/2023 1222 Gross per 24 hour  Intake 272.72 ml  Output 2775 ml  Net -2502.28 ml   Filed Weights   08/03/23 1421 08/03/23 1650  Weight: 88.8 kg 83 kg    Examination:  General exam: NAD Respiratory system: Lungs clear.  Normal work of breathing.  2 L Cardiovascular system: S1-S2, RRR, no murmurs, 2+ pitting edema BLE Gastrointestinal system: Soft, NT/ND, edematous Central nervous system: Alert and oriented. No focal neurological deficits. Extremities: Symmetric 5 x 5 power. Skin: No rashes, lesions or ulcers Psychiatry: Judgement and insight appear normal. Mood & affect appropriate.     Data Reviewed: I have personally reviewed following labs and imaging studies  CBC: Recent Labs  Lab 08/03/23 1424 08/04/23 0305  WBC 11.6* 5.8  HGB 10.5* 9.8*  HCT 31.9* 29.7*  MCV 88.6 87.6  PLT 155 129*   Basic Metabolic Panel: Recent Labs  Lab 08/03/23 1424 08/03/23 1954 08/03/23 2256 08/04/23 0305 08/04/23 0742 08/04/23 1922 08/04/23 2322 08/05/23 0307 08/05/23 0721 08/05/23 0904  NA 133* 135  --  133*  --   --   --    --   --  133*  K 5.3* 4.7   < > 4.2   < > 3.6 3.5 3.4* 3.1* 3.0*  CL 98 101  --  96*  --   --   --   --   --  93*  CO2 22 22  --  23  --   --   --   --   --  26  GLUCOSE 132* 159*  --  123*  --   --   --   --   --  89  BUN 89* 88*  --  91*  --   --   --   --   --  109*  CREATININE 4.16* 4.15*  --  4.19*  --   --   --   --   --  3.88*  CALCIUM  9.1 9.0  --  9.1  --   --   --   --   --  8.9  MG 2.0  --   --   --   --   --   --   --   --   --   PHOS 2.9  --   --   --   --   --   --   --   --   --    < > = values in this interval not displayed.   GFR: Estimated Creatinine Clearance: 19.7 mL/min (A) (by C-G formula based on SCr of 3.88 mg/dL (H)). Liver Function Tests: Recent Labs  Lab 08/03/23 1424 08/04/23 1524  AST 35  --   ALT 47*  --   ALKPHOS 57  --   BILITOT 1.3*  --   PROT 7.5  --   ALBUMIN 3.8 3.2*   No results for input(s): "LIPASE", "AMYLASE" in the last 168 hours. No results for input(s): "AMMONIA" in the last 168 hours. Coagulation Profile: No results for input(s): "INR", "PROTIME" in the last 168 hours. Cardiac Enzymes: No results for input(s): "CKTOTAL", "CKMB", "CKMBINDEX", "TROPONINI" in the last 168 hours. BNP (last 3 results) No results for input(s): "PROBNP" in the last 8760 hours. HbA1C: No results for input(s): "HGBA1C" in the last 72 hours. CBG: Recent Labs  Lab 08/03/23 1652 08/03/23 2023  GLUCAP 142* 165*   Lipid Profile: No results for input(s): "CHOL", "HDL", "LDLCALC", "TRIG", "CHOLHDL", "LDLDIRECT" in the last 72 hours. Thyroid  Function Tests: No results for input(s): "TSH", "T4TOTAL", "FREET4", "T3FREE", "THYROIDAB" in the last 72 hours. Anemia Panel: No results for input(s): "VITAMINB12", "FOLATE", "FERRITIN", "TIBC", "IRON", "RETICCTPCT" in the last 72 hours. Sepsis Labs: No results for input(s): "PROCALCITON", "LATICACIDVEN" in the last 168 hours.  Recent Results (from the past 240 hours)  MRSA Next Gen by PCR, Nasal     Status: None    Collection Time: 08/03/23  4:52 PM   Specimen: Nasal Mucosa; Nasal Swab  Result Value Ref Range Status   MRSA by PCR Next Gen NOT DETECTED NOT DETECTED Final    Comment: (NOTE) The GeneXpert MRSA Assay (FDA approved for NASAL specimens only), is one component of a  comprehensive MRSA colonization surveillance program. It is not intended to diagnose MRSA infection nor to guide or monitor treatment for MRSA infections. Test performance is not FDA approved in patients less than 31 years old. Performed at [facility], [address] [place], [place] [address]          Radiology Studies: PERIPHERAL VASCULAR CATHETERIZATION Result Date: 08/05/2023 See surgical note for result.       Scheduled Meds:  [MAR Hold] allopurinol   100 mg Oral Daily   [MAR Hold] amLODipine   5 mg Oral BID   [MAR Hold] apixaban   2.5 mg Oral BID   [MAR Hold] Chlorhexidine  Gluconate Cloth  6 each Topical Q0600   [MAR Hold] cholecalciferol   1,000 Units Oral Daily   [MAR Hold] cloNIDine   0.1 mg Oral BID   [MAR Hold] hydrALAZINE   100 mg Oral TID   [MAR Hold] isosorbide  mononitrate  60 mg Oral Daily   [MAR Hold] pantoprazole   40 mg Oral Daily   [MAR Hold] rosuvastatin   10 mg Oral QHS   Continuous Infusions:  furosemide  (LASIX ) 200 mg in dextrose  5 % 100 mL (2 mg/mL) infusion 4 mg/hr (08/05/23 0738)     LOS: 2 days    Tiajuana Fluke, MD [facility]   If 7PM-7AM, please contact night-coverage  08/05/2023, 3:19 PM

## 2023-08-05 NOTE — Progress Notes (Signed)
 Postop check  S: Patient is sitting up at the bedside watching TV when I enter.  Denies pain.  O: Vital signs are stable monitor shows a blood pressure of 142/70.      Ecchymoses base of right neck.  Dressing around catheter is clean and intact.  A: Right IJ tunneled catheter placement associated with bleeding  P: Patient is stable doing well catheter is intact and in good position.  We will plan for dialysis tomorrow.  I did discuss the bleeding with the patient and described the indications for the femoral access as well as the fact that the imaging via the pigtail in the distal internal jugular vein demonstrated normal flow without any evidence of bleeding.  I also cautioned that he should expect a fair bit of bruising.

## 2023-08-05 NOTE — Progress Notes (Signed)
 [facility]  ROUNDING NOTE   Subjective:  [name] was admitted to [facility] on 08/03/2023 with recurrent shortness of breath and weight gain.  Patient is followed by Dr Zelda Hickman outpatient.   Patient well-known to us  from last hospitalization where he was significantly fluid overloaded and initially started on dialysis. He did have some renal recovery though his urine output was a bit lower than expected close to his discharge.  He was seen back in the office on 07/28/2023. Since then he has gained additional weight. Upon readmission patient restarted on Lasix  drip.  Update:  No new renal function testing available this a.m. Patient did not clear urine output yesterday 2.4 L. Awaiting PermCath placement today.    Objective:  Vital signs in last 24 hours:  Temp:  [97.3 F (36.3 C)-98.6 F (37 C)] 97.6 F (36.4 C) (05/20 0739) Pulse Rate:  [63-78] 63 (05/20 0600) Resp:  [12-22] 12 (05/20 0600) BP: (138-167)/(60-86) 166/71 (05/20 0600) SpO2:  [92 %-100 %] 95 % (05/20 0600)  Weight change:  Filed Weights   08/03/23 1421 08/03/23 1650  Weight: 88.8 kg 83 kg    Intake/Output: I/O last 3 completed shifts: In: 1031.5 [P.O.:960; I.V.:71.5] Out: 3525 [Urine:3525]   Intake/Output this shift:  Total I/O In: -  Out: 450 [Urine:450]  Physical Exam: General: No acute distress.  Head: Normocephalic, atraumatic. Moist oral mucosal membranes  Eyes: Anicteric  Lungs:  Basilar rales  Heart: Regular rate and rhythm  Abdomen:  Soft, nontender  Extremities: Trace peripheral edema.  Neurologic: Alert, moving all four extremities  Skin: No lesions  Access: None    Basic Metabolic Panel: Recent Labs  Lab 08/03/23 1424 08/03/23 1954 08/03/23 2256 08/04/23 0305 08/04/23 0742 08/04/23 1126 08/04/23 1524 08/04/23 1922 08/04/23 2322 08/05/23 0307  NA 133* 135  --  133*  --   --   --   --   --   --   K 5.3* 4.7   < > 4.2   < > 3.9 3.9 3.6 3.5 3.4*  CL  98 101  --  96*  --   --   --   --   --   --   CO2 22 22  --  23  --   --   --   --   --   --   GLUCOSE 132* 159*  --  123*  --   --   --   --   --   --   BUN 89* 88*  --  91*  --   --   --   --   --   --   CREATININE 4.16* 4.15*  --  4.19*  --   --   --   --   --   --   CALCIUM  9.1 9.0  --  9.1  --   --   --   --   --   --   MG 2.0  --   --   --   --   --   --   --   --   --   PHOS 2.9  --   --   --   --   --   --   --   --   --    < > = values in this interval not displayed.    Liver Function Tests: Recent Labs  Lab 08/03/23 1424 08/04/23 1524  AST 35  --  ALT 47*  --   ALKPHOS 57  --   BILITOT 1.3*  --   PROT 7.5  --   ALBUMIN 3.8 3.2*   No results for input(s): "LIPASE", "AMYLASE" in the last 168 hours. No results for input(s): "AMMONIA" in the last 168 hours.  CBC: Recent Labs  Lab 08/03/23 1424 08/04/23 0305  WBC 11.6* 5.8  HGB 10.5* 9.8*  HCT 31.9* 29.7*  MCV 88.6 87.6  PLT 155 129*    Cardiac Enzymes: No results for input(s): "CKTOTAL", "CKMB", "CKMBINDEX", "TROPONINI" in the last 168 hours.  BNP: Invalid input(s): "POCBNP"  CBG: Recent Labs  Lab 08/03/23 1652 08/03/23 2023  GLUCAP 142* 165*     Microbiology: Results for orders placed or performed during the hospital encounter of 08/03/23  MRSA Next Gen by PCR, Nasal     Status: None   Collection Time: 08/03/23  4:52 PM   Specimen: Nasal Mucosa; Nasal Swab  Result Value Ref Range Status   MRSA by PCR Next Gen NOT DETECTED NOT DETECTED Final    Comment: (NOTE) The GeneXpert MRSA Assay (FDA approved for NASAL specimens only), is one component of a comprehensive MRSA colonization surveillance program. It is not intended to diagnose MRSA infection nor to guide or monitor treatment for MRSA infections. Test performance is not FDA approved in patients less than 32 years old. Performed at [facility], [address] [place], [place] [address]     Coagulation Studies: No results for  input(s): "LABPROT", "INR" in the last 72 hours.   Urinalysis: No results for input(s): "COLORURINE", "LABSPEC", "PHURINE", "GLUCOSEU", "HGBUR", "BILIRUBINUR", "KETONESUR", "PROTEINUR", "UROBILINOGEN", "NITRITE", "LEUKOCYTESUR" in the last 72 hours.  Invalid input(s): "APPERANCEUR"    Imaging: DG Chest Portable 1 View Result Date: 08/03/2023 CLINICAL DATA:  Dyspnea. EXAM: PORTABLE CHEST 1 VIEW COMPARISON:  July 11, 2023. FINDINGS: Stable cardiomediastinal silhouette. Both lungs are clear. The visualized skeletal structures are unremarkable. IMPRESSION: No active disease. Electronically Signed   By: Rosalene Colon M.D.   On: 08/03/2023 14:39       Medications:    sodium chloride       ceFAZolin  (ANCEF ) IV     furosemide  (LASIX ) 200 mg in dextrose  5 % 100 mL (2 mg/mL) infusion 4 mg/hr (08/05/23 0738)     allopurinol   100 mg Oral Daily   amLODipine   5 mg Oral BID   apixaban   2.5 mg Oral BID   Chlorhexidine  Gluconate Cloth  6 each Topical Q0600   cholecalciferol   1,000 Units Oral Daily   cloNIDine   0.1 mg Oral BID   hydrALAZINE   100 mg Oral TID   isosorbide  mononitrate  60 mg Oral Daily   pantoprazole   40 mg Oral Daily   rosuvastatin   10 mg Oral QHS   acetaminophen  **OR** acetaminophen , diphenhydrAMINE , famotidine , hydrALAZINE , ipratropium-albuterol , methylPREDNISolone  (SOLU-MEDROL ) injection, midazolam , nitroGLYCERIN , ondansetron  **OR** ondansetron  (ZOFRAN ) IV, senna-docusate  Assessment/ Plan:  [name] is a 73 y.o.  male with PMH of HFrEF,CAD, PVD, CVA, COPD, stage IV CKD who is admitted to [facility] on 06/21/2022 for Hyperkalemia [E87.5] AKI (acute kidney injury) (HCC) [N17.9] Acute on chronic congestive heart failure, unspecified heart failure type (HCC) [I50.9] Acute hypoxic respiratory failure (HCC) [J96.01]  Acute Kidney Injury on chronic kidney disease stage IV 2/2 to acute cardiorenal syndrome: baseline creatinine EGFR of 18.. Chronic kidney disease  secondary to renal vascular disease. Renal ultrasound shows atrophic right kidney.  Patient to have permcath placed today.  Will plan for 1st HD treatment thereafter.   Lab Results  Component Value Date   CREATININE 4.19 (H) 08/04/2023   CREATININE 4.15 (H) 08/03/2023   CREATININE 4.16 (H) 08/03/2023     Intake/Output Summary (Last 24 hours) at 08/05/2023 0749 Last data filed at 08/05/2023 0738 Gross per 24 hour  Intake 1006.05 ml  Output 2925 ml  Net -1918.95 ml      Acute on chronic systolic and diastolic heart failure.  Did have reasonable response to lasix  gtt yesterday, will likely stop this once on dialysis.  [name]   3. Anemia with chronic kidney disease: Hemoglobin down to 9.8.  Consider starting the patient on Mircera soon.      LOS: 2 Gusta Marksberry 5/20/20257:49 AM

## 2023-08-05 NOTE — Plan of Care (Signed)

## 2023-08-05 NOTE — Plan of Care (Signed)
  Problem: Education: Goal: Knowledge of General Education information will improve Description: Including pain rating scale, medication(s)/side effects and non-pharmacologic comfort measures 08/05/2023 0324 by Vince Grebe, RN Outcome: Progressing 08/04/2023 2320 by Vince Grebe, RN Outcome: Progressing   Problem: Health Behavior/Discharge Planning: Goal: Ability to manage health-related needs will improve 08/05/2023 0324 by Vince Grebe, RN Outcome: Progressing 08/04/2023 2320 by Vince Grebe, RN Outcome: Progressing   Problem: Clinical Measurements: Goal: Ability to maintain clinical measurements within normal limits will improve 08/05/2023 0324 by Vince Grebe, RN Outcome: Progressing 08/04/2023 2320 by Vince Grebe, RN Outcome: Progressing Goal: Will remain free from infection 08/05/2023 0324 by Vince Grebe, RN Outcome: Progressing 08/04/2023 2320 by Vince Grebe, RN Outcome: Progressing Goal: Diagnostic test results will improve 08/05/2023 0324 by Vince Grebe, RN Outcome: Progressing 08/04/2023 2320 by Vince Grebe, RN Outcome: Progressing Goal: Respiratory complications will improve 08/05/2023 0324 by Vince Grebe, RN Outcome: Progressing 08/04/2023 2320 by Vince Grebe, RN Outcome: Progressing Goal: Cardiovascular complication will be avoided 08/05/2023 0324 by Vince Grebe, RN Outcome: Progressing 08/04/2023 2320 by Vince Grebe, RN Outcome: Progressing   Problem: Activity: Goal: Risk for activity intolerance will decrease 08/05/2023 0324 by Vince Grebe, RN Outcome: Progressing 08/04/2023 2320 by Vince Grebe, RN Outcome: Progressing   Problem: Nutrition: Goal: Adequate nutrition will be maintained 08/05/2023 0324 by Vince Grebe, RN Outcome: Progressing 08/04/2023 2320 by Vince Grebe, RN Outcome: Progressing   Problem: Coping: Goal: Level of anxiety will decrease 08/05/2023 0324 by Vince Grebe, RN Outcome:  Progressing 08/04/2023 2320 by Vince Grebe, RN Outcome: Progressing   Problem: Elimination: Goal: Will not experience complications related to bowel motility 08/05/2023 0324 by Vince Grebe, RN Outcome: Progressing 08/04/2023 2320 by Vince Grebe, RN Outcome: Progressing Goal: Will not experience complications related to urinary retention 08/05/2023 0324 by Vince Grebe, RN Outcome: Progressing 08/04/2023 2320 by Vince Grebe, RN Outcome: Progressing   Problem: Pain Managment: Goal: General experience of comfort will improve and/or be controlled 08/05/2023 0324 by Vince Grebe, RN Outcome: Progressing 08/04/2023 2320 by Vince Grebe, RN Outcome: Progressing   Problem: Safety: Goal: Ability to remain free from injury will improve 08/05/2023 0324 by Vince Grebe, RN Outcome: Progressing 08/04/2023 2320 by Vince Grebe, RN Outcome: Progressing   Problem: Skin Integrity: Goal: Risk for impaired skin integrity will decrease 08/05/2023 0324 by Vince Grebe, RN Outcome: Progressing 08/04/2023 2320 by Vince Grebe, RN Outcome: Progressing

## 2023-08-05 NOTE — Op Note (Signed)
 [facility]   OPERATIVE NOTE     PROCEDURE: 1. Insertion of a right IJ tunneled dialysis catheter. 2. Catheter placement and cannulation under ultrasound and fluoroscopic guidance. 3.  Ultrasound-guided access to the right common femoral vein. 4.  Catheter placement into the distal internal jugular vein from the femoral venous approach  5.  internal jugular and central venous imaging performed from the femoral approach  PRE-OPERATIVE DIAGNOSIS: end-stage renal requiring hemodialysis  POST-OPERATIVE DIAGNOSIS: same as above  SURGEON: Devon Fogo, MD FIRST ASSISTANT:  Mikki Alexander, MD  ANESTHESIA: Conscious sedation was administered under my direct supervision by the interventional radiology RN. IV Versed  plus fentanyl  were utilized. Continuous ECG, pulse oximetry and blood pressure was monitored throughout the entire procedure.  Conscious sedation was for a total of 112 minutes.  CONTRAST:  50 CC  Fluoroscopy time: 9.7 minutes  ESTIMATED BLOOD LOSS: Minimal  FINDING(S): 1.  Tips of the catheter in the right atrium on fluoroscopy 2.  No obvious pneumothorax on fluoroscopy  SPECIMEN(S):  none  INDICATIONS:   Gary Mccoy. is a 73 y.o. male  presents with end stage renal disease.  Therefore, the patient requires a tunneled dialysis catheter placement.  The patient is informed of  the risks catheter placement include but are not limited to: bleeding, infection, central venous injury, pneumothorax, possible venous stenosis, possible malpositioning in the venous system, and possible infections related to long-term catheter presence.  The patient was aware of these risks and agreed to proceed.  DESCRIPTION: The patient was taken back to Special Procedure suite.  Prior to sedation, the patient was given IV antibiotics.  After obtaining adequate sedation, the patient was prepped and draped in the standard fashion for a right IJ tunneled dialysis catheter  placement.  Appropriate Time Out is called.     The right neck and chest wall are then infiltrated with 1% Lidocaine  with epinepherine.  A 19 cm tip to cuff catheter is then selected, opened on the back table and prepped. Ultrasound is placed in a sterile sleeve.  Under ultrasound guidance, the right IJ vein is examined and is noted to be echolucent and easily compressible indicating patency.   An image is recorded for the permanent record.  The right IJ vein is cannulated with the microneedle under direct ultrasound vissualization.  A Microwire followed by a micro sheath is then inserted without difficulty.   A J-wire was then advanced under fluoroscopic guidance into the inferior vena cava and the wire was secured.  Small counter incision was then made at the wire insertion site. A small pocket was fashioned with blunt dissection to allow easier passage of the cuff.  The dilator and peel-away sheath are then advanced over the wire under fluoroscopic guidance. The catheters and advanced through the peel-away sheath.  Under fluoroscopy the catheter appeared to have a abnormal tract.  Hand-injection of contrast demonstrated extravasation and the catheter was then pulled back until aspiration returned good blood flow.  The J-wire was then negotiated into the distal SVC.  The catheter was then removed and a 10 French 11 cm Pinnacle sheath was placed.  Using a Kumpe catheter over the wire the wire was then negotiated into the inferior vena cava.  An Amplatz Super Stiff wire was then placed through the Kumpe catheter and the Kumpe catheter removed.  Hand-injection of contrast was then performed through the sheath which demonstrated some extravasation.  Initially, a 10 mm x 40 mm balloon was  advanced across this area and then inflated to 6 atm.  Tamponade was applied for 5 minutes.  Follow-up imaging demonstrated significant reduction in the extravasation but but not illumination.  Therefore, I selected a 14 mm x 40  mm balloon and inflated across this area.  Tamponade was applied for 15 minutes.  During this time the patient's hemodynamics remained completely stable.  Follow-up imaging after this 15-minute period was ambiguous as to whether there was persistent extravasation.  Given my concern the right groin was then prepped and draped in a sterile fashion.  Ultrasound was delivered onto the field in a sterile sleeve.  The right common femoral vein was evaluated it was echolucent compressible indicating patency.  Images were recorded for the permanent record.  Under direct ultrasound visualization a Seldinger needle was used to access the vein.  J-wire was advanced followed by a 6 [place] sheath.  A Perclose device was used in a preclose fashion.  Given that I had already used a 14 mm standard angioplasty balloon I selected a 12 French sheath to allow for a Coda balloon if needed.  After the preclose was performed the 12 French sheath was inserted.  Kumpe was advanced up the J-wire through the atrium and into the proximal internal jugular.  Hand-injection of contrast showed that the problem appeared to have resolved.  In order to be certain a advantage wire was then advanced into the more distal jugular vein and a pigtail catheter was reformed in the distal jugular vein.  Hand-injection of contrast at this level now confirmed that there was no ongoing extravasation.  The pigtail catheter and wire were removed and I elected to continue with catheter placement as we already had jugular access.  A 19 cm tip to cuff catheter was then advanced through the peel-away sheath over the Amplatz wire.  It is approximated to the chest wall after verifying the tips at the atrial caval junction and an exit site is selected.  Small incision is made at the selected exit site and the tunneling device was passed subcutaneously to the counter incision. Catheter is then pulled through the subcutaneous tunnel. The catheter is then verified for tip  position under fluoroscopy, transected and the hub assembly connected.  The 12 French sheath was removed from the femoral vein and the Perclose device secured.  No extravasation was noted light pressure was held for 5 minutes.  Each port was tested by aspirating and flushing.  No resistance was noted.  Each port was then thoroughly flushed with heparinized saline.  The catheter was secured in placed with two interrupted stitches of 0 silk tied to the catheter.  The counter incision was closed with a U-stitch of 4-0 Monocryl.  The insertion site is then cleaned and sterile bandages applied including a Biopatch.  Each port was then packed with concentrated heparin  (1000 Units/mL) at the manufacturer recommended volumes to each port.  Sterile caps were applied to each port.  On completion fluoroscopy, the tips of the catheter were in the right atrium, and there was no evidence of pneumothorax.  COMPLICATIONS: None  CONDITION: Unchanged   Devon Fogo [facility] Office: [phone]   08/05/2023, 2:35 PM

## 2023-08-06 DIAGNOSIS — J9601 Acute respiratory failure with hypoxia: Secondary | ICD-10-CM | POA: Diagnosis not present

## 2023-08-06 LAB — CBC
HCT: 27.8 % — ABNORMAL LOW (ref 39.0–52.0)
Hemoglobin: 9.4 g/dL — ABNORMAL LOW (ref 13.0–17.0)
MCH: 28.5 pg (ref 26.0–34.0)
MCHC: 33.8 g/dL (ref 30.0–36.0)
MCV: 84.2 fL (ref 80.0–100.0)
Platelets: 174 10*3/uL (ref 150–400)
RBC: 3.3 MIL/uL — ABNORMAL LOW (ref 4.22–5.81)
RDW: 15.1 % (ref 11.5–15.5)
WBC: 6.7 10*3/uL (ref 4.0–10.5)
nRBC: 0 % (ref 0.0–0.2)

## 2023-08-06 LAB — RENAL FUNCTION PANEL
Albumin: 3.3 g/dL — ABNORMAL LOW (ref 3.5–5.0)
Anion gap: 14 (ref 5–15)
BUN: 106 mg/dL — ABNORMAL HIGH (ref 8–23)
CO2: 28 mmol/L (ref 22–32)
Calcium: 8.9 mg/dL (ref 8.9–10.3)
Chloride: 91 mmol/L — ABNORMAL LOW (ref 98–111)
Creatinine, Ser: 3.6 mg/dL — ABNORMAL HIGH (ref 0.61–1.24)
GFR, Estimated: 17 mL/min — ABNORMAL LOW (ref >60)
Glucose, Bld: 83 mg/dL (ref 70–99)
Phosphorus: 4.9 mg/dL — ABNORMAL HIGH (ref 2.5–4.6)
Potassium: 2.7 mmol/L — CL (ref 3.5–5.1)
Sodium: 133 mmol/L — ABNORMAL LOW (ref 135–145)

## 2023-08-06 LAB — BASIC METABOLIC PANEL WITH GFR
Anion gap: 13 (ref 5–15)
BUN: 55 mg/dL — ABNORMAL HIGH (ref 8–23)
CO2: 24 mmol/L (ref 22–32)
Calcium: 8.6 mg/dL — ABNORMAL LOW (ref 8.9–10.3)
Chloride: 95 mmol/L — ABNORMAL LOW (ref 98–111)
Creatinine, Ser: 2.56 mg/dL — ABNORMAL HIGH (ref 0.61–1.24)
GFR, Estimated: 26 mL/min — ABNORMAL LOW (ref >60)
Glucose, Bld: 158 mg/dL — ABNORMAL HIGH (ref 70–99)
Potassium: 3.1 mmol/L — ABNORMAL LOW (ref 3.5–5.1)
Sodium: 132 mmol/L — ABNORMAL LOW (ref 135–145)

## 2023-08-06 MED ORDER — POTASSIUM CHLORIDE CRYS ER 20 MEQ PO TBCR
40.0000 meq | EXTENDED_RELEASE_TABLET | Freq: Once | ORAL | Status: AC
  Administered 2023-08-06: 40 meq via ORAL
  Filled 2023-08-06: qty 2

## 2023-08-06 MED ORDER — HEPARIN SODIUM (PORCINE) 1000 UNIT/ML IJ SOLN
INTRAMUSCULAR | Status: AC
  Filled 2023-08-06: qty 10

## 2023-08-06 MED ORDER — FUROSEMIDE 40 MG PO TABS
80.0000 mg | ORAL_TABLET | Freq: Every day | ORAL | Status: DC
  Administered 2023-08-06 – 2023-08-15 (×9): 80 mg via ORAL
  Filled 2023-08-06 (×4): qty 2
  Filled 2023-08-06: qty 4
  Filled 2023-08-06 (×4): qty 2

## 2023-08-06 NOTE — Progress Notes (Signed)
 Patient transferred. All patient belongings kept at bedside transferred with patient. Patient AxOx4. VSS.

## 2023-08-06 NOTE — Progress Notes (Signed)
 PROGRESS NOTE    Gary Mccoy.  ZHY:[id number] DOB: Nov 11, 1950 DOA: 08/03/2023 PCP: Sari Cunning, MD    Brief Narrative:  73 year old male with history of combined heart failure, COPD, CKD stage IV, hypertension, hyperlipidemia, history of CVA, dilated cardiomyopathy, history of CAD status post PCI with DES in 2023, PAD, on oral anticoagulation, who presents emergency department for chief concerns of shortness of breath.   Furosemide  80 mg IV one-time dose, DuoNebs one-time treatment.  EDP discussed with nephrology, who is aware of patient and recommends furosemide  gtt. as well.   Per ED provider, patient has been admitted for similar in the past and had apparent plans for CRRT.  Patient does not have hemodialysis access at this time.  Assessment & Plan:   Principal Problem:   Acute hypoxic respiratory failure (HCC) Active Problems:   Acute on chronic respiratory failure with hypoxia (HCC)   Acute renal failure superimposed on stage 4 chronic kidney disease (HCC)   PAD s/p aortobifemoral bypass 2017 (peripheral artery disease)   Benign essential HTN   Leukocytosis   Hyperlipidemia   CVA (cerebral vascular accident) (HCC)   Acute exacerbation of CHF (congestive heart failure) (HCC)   CKD (chronic kidney disease) stage 4, GFR 15-29 ml/min (HCC)   CAD S/P percutaneous coronary angioplasty  Acute hypoxic respiratory failure (HCC) Suspect secondary to heart failure exacerbation BPAP weaned off Satting well on 2 liters, will trial off it  HFpEF Volume mgmt primarily with dialysis now, oral lasix , too. Appears euvolemic today   Acute renal failure superimposed on stage 4 chronic kidney disease (HCC) Progressed to ESRD Plan: PermCath placement with vascular surgery 5/20. Tolerated dialysis today  Hyperlipidemia Continue statin  Benign essential HTN Continue home Imdur  Continue home hydralazine  Continue home amlodipine  Continue home clonidine  As needed IV  hydralazine   Hypokalemia Replete  Debility PT consult  DVT prophylaxis: Eliquis  Code Status: FULL Family Communication:None Disposition Plan: Status is: Inpatient Remains inpatient appropriate because: Multiple acute issues as above   Level of care: Telemetry Medical  Consultants:  Nephrology Vascular surgery  Procedures:  Dialysis catheter placement  Antimicrobials: None   Subjective: Seen and examined resting in bed.  Bit fatigued after dialysis but breathing much better  Objective: Vitals:   08/06/23 1200 08/06/23 1300 08/06/23 1400 08/06/23 1500  BP: (!) 155/68 114/70 119/60 (!) 126/55  Pulse: 69 72 67 66  Resp: 18 11 14 13   Temp:      TempSrc:      SpO2: 92% 98% 98% 97%  Weight:      Height:        Intake/Output Summary (Last 24 hours) at 08/06/2023 1554 Last data filed at 08/06/2023 1357 Gross per 24 hour  Intake 387.74 ml  Output 3550 ml  Net -3162.26 ml   Filed Weights   08/03/23 1650 08/06/23 0801 08/06/23 1057  Weight: 83 kg 75 kg 74 kg    Examination:  General exam: NAD Respiratory system: Lungs clear.  Normal work of breathing.    Cardiovascular system: S1-S2, RRR, no murmurs,   Gastrointestinal system: Soft, NT/ND,  Central nervous system: Alert and oriented. No focal neurological deficits. Extremities: Symmetric 5 x 5 power. No edema Skin: No rashes, lesions or ulcers Psychiatry: Judgement and insight appear normal. Mood & affect appropriate.     Data Reviewed: I have personally reviewed following labs and imaging studies  CBC: Recent Labs  Lab 08/03/23 1424 08/04/23 0305 08/06/23 0811  WBC 11.6* 5.8 6.7  HGB  10.5* 9.8* 9.4*  HCT 31.9* 29.7* 27.8*  MCV 88.6 87.6 84.2  PLT 155 129* 174   Basic Metabolic Panel: Recent Labs  Lab 08/03/23 1424 08/03/23 1954 08/03/23 2256 08/04/23 0305 08/04/23 0742 08/05/23 0307 08/05/23 0721 08/05/23 0904 08/06/23 0811 08/06/23 1416  NA 133* 135  --  133*  --   --   --  133* 133*  132*  K 5.3* 4.7   < > 4.2   < > 3.4* 3.1* 3.0* 2.7* 3.1*  CL 98 101  --  96*  --   --   --  93* 91* 95*  CO2 22 22  --  23  --   --   --  26 28 24   GLUCOSE 132* 159*  --  123*  --   --   --  89 83 158*  BUN 89* 88*  --  91*  --   --   --  109* 106* 55*  CREATININE 4.16* 4.15*  --  4.19*  --   --   --  3.88* 3.60* 2.56*  CALCIUM  9.1 9.0  --  9.1  --   --   --  8.9 8.9 8.6*  MG 2.0  --   --   --   --   --   --   --   --   --   PHOS 2.9  --   --   --   --   --   --   --  4.9*  --    < > = values in this interval not displayed.   GFR: Estimated Creatinine Clearance: 27.7 mL/min (A) (by C-G formula based on SCr of 2.56 mg/dL (H)). Liver Function Tests: Recent Labs  Lab 08/03/23 1424 08/04/23 1524 08/06/23 0811  AST 35  --   --   ALT 47*  --   --   ALKPHOS 57  --   --   BILITOT 1.3*  --   --   PROT 7.5  --   --   ALBUMIN 3.8 3.2* 3.3*   No results for input(s): "LIPASE", "AMYLASE" in the last 168 hours. No results for input(s): "AMMONIA" in the last 168 hours. Coagulation Profile: No results for input(s): "INR", "PROTIME" in the last 168 hours. Cardiac Enzymes: No results for input(s): "CKTOTAL", "CKMB", "CKMBINDEX", "TROPONINI" in the last 168 hours. BNP (last 3 results) No results for input(s): "PROBNP" in the last 8760 hours. HbA1C: No results for input(s): "HGBA1C" in the last 72 hours. CBG: Recent Labs  Lab 08/03/23 1652 08/03/23 2023  GLUCAP 142* 165*   Lipid Profile: No results for input(s): "CHOL", "HDL", "LDLCALC", "TRIG", "CHOLHDL", "LDLDIRECT" in the last 72 hours. Thyroid  Function Tests: No results for input(s): "TSH", "T4TOTAL", "FREET4", "T3FREE", "THYROIDAB" in the last 72 hours. Anemia Panel: No results for input(s): "VITAMINB12", "FOLATE", "FERRITIN", "TIBC", "IRON", "RETICCTPCT" in the last 72 hours. Sepsis Labs: No results for input(s): "PROCALCITON", "LATICACIDVEN" in the last 168 hours.  Recent Results (from the past 240 hours)  MRSA Next Gen by  PCR, Nasal     Status: None   Collection Time: 08/03/23  4:52 PM   Specimen: Nasal Mucosa; Nasal Swab  Result Value Ref Range Status   MRSA by PCR Next Gen NOT DETECTED NOT DETECTED Final    Comment: (NOTE) The GeneXpert MRSA Assay (FDA approved for NASAL specimens only), is one component of a comprehensive MRSA colonization surveillance program. It is not intended to diagnose MRSA infection  nor to guide or monitor treatment for MRSA infections. Test performance is not FDA approved in patients less than 55 years old. Performed at [facility], [address] [place], [place] [address]          Radiology Studies: PERIPHERAL VASCULAR CATHETERIZATION Result Date: 08/05/2023 See surgical note for result.       Scheduled Meds:  allopurinol   100 mg Oral Daily   amLODipine   5 mg Oral BID   apixaban   2.5 mg Oral BID   Chlorhexidine  Gluconate Cloth  6 each Topical Q0600   cholecalciferol   1,000 Units Oral Daily   cloNIDine   0.1 mg Oral BID   furosemide   80 mg Oral Daily   hydrALAZINE   100 mg Oral TID   isosorbide  mononitrate  60 mg Oral Daily   pantoprazole   40 mg Oral Daily   potassium chloride   40 mEq Oral Once   rosuvastatin   10 mg Oral QHS   Continuous Infusions:     LOS: 3 days    Raymonde Calico, MD [facility]   If 7PM-7AM, please contact night-coverage  08/06/2023, 3:54 PM

## 2023-08-06 NOTE — Progress Notes (Signed)
 [facility]  ROUNDING NOTE   Subjective:  Gary Mccoy. was admitted to [facility] on 08/03/2023 with recurrent shortness of breath and weight gain.  Patient is followed by Dr Zelda Hickman outpatient.   Patient well-known to us  from last hospitalization where he was significantly fluid overloaded and initially started on dialysis. He did have some renal recovery though his urine output was a bit lower than expected close to his discharge.  He was seen back in the office on 07/28/2023. Since then he has gained additional weight. Upon readmission patient restarted on Lasix  drip.  Update:  Patient seen and evaluated during dialysis   HEMODIALYSIS FLOWSHEET:  Blood Flow Rate (mL/min): 249 mL/min Arterial Pressure (mmHg): -43.43 mmHg Venous Pressure (mmHg): 133.73 mmHg TMP (mmHg): 26.87 mmHg Ultrafiltration Rate (mL/min): 840 mL/min Dialysate Flow Rate (mL/min): 299 ml/min  Denies discomfort from permcath site    Objective:  Vital signs in last 24 hours:  Temp:  [95.9 F (35.5 C)-98.6 F (37 C)] 98.1 F (36.7 C) (05/21 0801) Pulse Rate:  [50-94] 66 (05/21 0930) Resp:  [8-22] 14 (05/21 0930) BP: (108-183)/(52-100) 171/75 (05/21 0930) SpO2:  [87 %-100 %] 96 % (05/21 0930) Weight:  [75 kg] 75 kg (05/21 0801)  Weight change:  Filed Weights   08/03/23 1421 08/03/23 1650 08/06/23 0801  Weight: 88.8 kg 83 kg 75 kg    Intake/Output: I/O last 3 completed shifts: In: 402.6 [P.O.:240; I.V.:112.6; IV Piggyback:50] Out: 4725 [Urine:4725]   Intake/Output this shift:  No intake/output data recorded.  Physical Exam: General: No acute distress.  Head: Normocephalic, atraumatic. Moist oral mucosal membranes  Eyes: Anicteric  Lungs:  Basilar rales, normal effort  Heart: Regular rate and rhythm  Abdomen:  Soft, nontender  Extremities: Trace peripheral edema.  Neurologic: Alert, moving all four extremities  Skin: No lesions  Access: Rt internal jugular permcath     Basic Metabolic Panel: Recent Labs  Lab 08/03/23 1424 08/03/23 1954 08/03/23 2256 08/04/23 0305 08/04/23 0742 08/04/23 2322 08/05/23 0307 08/05/23 0721 08/05/23 0904 08/06/23 0811  NA 133* 135  --  133*  --   --   --   --  133* 133*  K 5.3* 4.7   < > 4.2   < > 3.5 3.4* 3.1* 3.0* 2.7*  CL 98 101  --  96*  --   --   --   --  93* 91*  CO2 22 22  --  23  --   --   --   --  26 28  GLUCOSE 132* 159*  --  123*  --   --   --   --  89 83  BUN 89* 88*  --  91*  --   --   --   --  109* 106*  CREATININE 4.16* 4.15*  --  4.19*  --   --   --   --  3.88* 3.60*  CALCIUM  9.1 9.0  --  9.1  --   --   --   --  8.9 8.9  MG 2.0  --   --   --   --   --   --   --   --   --   PHOS 2.9  --   --   --   --   --   --   --   --  4.9*   < > = values in this interval not displayed.    Liver Function Tests:  Recent Labs  Lab 08/03/23 1424 08/04/23 1524 08/06/23 0811  AST 35  --   --   ALT 47*  --   --   ALKPHOS 57  --   --   BILITOT 1.3*  --   --   PROT 7.5  --   --   ALBUMIN 3.8 3.2* 3.3*   No results for input(s): "LIPASE", "AMYLASE" in the last 168 hours. No results for input(s): "AMMONIA" in the last 168 hours.  CBC: Recent Labs  Lab 08/03/23 1424 08/04/23 0305 08/06/23 0811  WBC 11.6* 5.8 6.7  HGB 10.5* 9.8* 9.4*  HCT 31.9* 29.7* 27.8*  MCV 88.6 87.6 84.2  PLT 155 129* 174    Cardiac Enzymes: No results for input(s): "CKTOTAL", "CKMB", "CKMBINDEX", "TROPONINI" in the last 168 hours.  BNP: Invalid input(s): "POCBNP"  CBG: Recent Labs  Lab 08/03/23 1652 08/03/23 2023  GLUCAP 142* 165*     Microbiology: Results for orders placed or performed during the hospital encounter of 08/03/23  MRSA Next Gen by PCR, Nasal     Status: None   Collection Time: 08/03/23  4:52 PM   Specimen: Nasal Mucosa; Nasal Swab  Result Value Ref Range Status   MRSA by PCR Next Gen NOT DETECTED NOT DETECTED Final    Comment: (NOTE) The GeneXpert MRSA Assay (FDA approved for NASAL specimens  only), is one component of a comprehensive MRSA colonization surveillance program. It is not intended to diagnose MRSA infection nor to guide or monitor treatment for MRSA infections. Test performance is not FDA approved in patients less than 83 years old. Performed at [facility], [address] [place], [place] [address]     Coagulation Studies: No results for input(s): "LABPROT", "INR" in the last 72 hours.   Urinalysis: No results for input(s): "COLORURINE", "LABSPEC", "PHURINE", "GLUCOSEU", "HGBUR", "BILIRUBINUR", "KETONESUR", "PROTEINUR", "UROBILINOGEN", "NITRITE", "LEUKOCYTESUR" in the last 72 hours.  Invalid input(s): "APPERANCEUR"    Imaging: PERIPHERAL VASCULAR CATHETERIZATION Result Date: 08/05/2023 See surgical note for result.      Medications:    furosemide  (LASIX ) 200 mg in dextrose  5 % 100 mL (2 mg/mL) infusion 4 mg/hr (08/06/23 0500)     allopurinol   100 mg Oral Daily   amLODipine   5 mg Oral BID   apixaban   2.5 mg Oral BID   Chlorhexidine  Gluconate Cloth  6 each Topical Q0600   cholecalciferol   1,000 Units Oral Daily   cloNIDine   0.1 mg Oral BID   hydrALAZINE   100 mg Oral TID   isosorbide  mononitrate  60 mg Oral Daily   pantoprazole   40 mg Oral Daily   rosuvastatin   10 mg Oral QHS   acetaminophen  **OR** acetaminophen , hydrALAZINE , ipratropium-albuterol , nitroGLYCERIN , ondansetron  **OR** ondansetron  (ZOFRAN ) IV, senna-docusate  Assessment/ Plan:  Gary Mccoy. is a 73 y.o.  male with PMH of HFrEF,CAD, PVD, CVA, COPD, stage IV CKD who is admitted to [facility] on 06/21/2022 for Hyperkalemia [E87.5] AKI (acute kidney injury) (HCC) [N17.9] Acute on chronic congestive heart failure, unspecified heart failure type (HCC) [I50.9] Acute hypoxic respiratory failure (HCC) [J96.01]  Acute Kidney Injury on chronic kidney disease stage IV 2/2 to acute cardiorenal syndrome: baseline creatinine EGFR of 18.. 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 Renal ultrasound shows atrophic right kidney.  Appreciate vascular placing rt internal jugular permcath this morning. Receiving dialysis today, UF 0. Plan to complete dialysis again tomorrow. Will begin outpatient dialysis clinic search, [facility].   Lab Results  Component Value Date   CREATININE 3.60 (H) 08/06/2023   CREATININE 3.88 (H) 08/05/2023   CREATININE 4.19 (H) 08/04/2023     Intake/Output Summary (Last 24 hours) at 08/06/2023 0951 Last data filed at 08/06/2023 0500 Gross per 24 hour  Intake 369.84 ml  Output 2650 ml  Net -2280.16 ml      Acute on chronic systolic and diastolic heart failure. Urine output 3.1L recorded in past 24 hours. Will order oral furosemide  80mg  daily.   3. Anemia with chronic kidney disease: Hemoglobin down to 9.4.  Can consider ESA as outpatient.       LOS: 3 Marinda Tyer 5/21/20259:51 AM

## 2023-08-06 NOTE — Progress Notes (Signed)
 Hemodialysis Note:  Received patient in bed to unit. Alert and oriented. Informed consent singed and in chart.  Treatment initiated: 0817 Treatment completed: 1056  Access used: Right internal jugular catheter Access issues: None  Patient tolerated well. Transported back to room, alert without acute distress. Report given to patient's RN.  Total UF removed: 1 Liter Medications given: None  Post HD weight: 74 Kg  [facility]

## 2023-08-06 NOTE — Plan of Care (Signed)

## 2023-08-07 DIAGNOSIS — J9601 Acute respiratory failure with hypoxia: Secondary | ICD-10-CM | POA: Diagnosis not present

## 2023-08-07 DIAGNOSIS — Z9889 Other specified postprocedural states: Secondary | ICD-10-CM

## 2023-08-07 DIAGNOSIS — Z95828 Presence of other vascular implants and grafts: Secondary | ICD-10-CM

## 2023-08-07 DIAGNOSIS — Z4901 Encounter for fitting and adjustment of extracorporeal dialysis catheter: Secondary | ICD-10-CM

## 2023-08-07 LAB — PHOSPHORUS: Phosphorus: 3.2 mg/dL (ref 2.5–4.6)

## 2023-08-07 LAB — CBC
HCT: 27.5 % — ABNORMAL LOW (ref 39.0–52.0)
Hemoglobin: 9.3 g/dL — ABNORMAL LOW (ref 13.0–17.0)
MCH: 28.8 pg (ref 26.0–34.0)
MCHC: 33.8 g/dL (ref 30.0–36.0)
MCV: 85.1 fL (ref 80.0–100.0)
Platelets: 181 10*3/uL (ref 150–400)
RBC: 3.23 MIL/uL — ABNORMAL LOW (ref 4.22–5.81)
RDW: 15.1 % (ref 11.5–15.5)
WBC: 6.8 10*3/uL (ref 4.0–10.5)
nRBC: 0 % (ref 0.0–0.2)

## 2023-08-07 LAB — MAGNESIUM: Magnesium: 1.9 mg/dL (ref 1.7–2.4)

## 2023-08-07 LAB — BASIC METABOLIC PANEL WITH GFR
Anion gap: 10 (ref 5–15)
BUN: 66 mg/dL — ABNORMAL HIGH (ref 8–23)
CO2: 27 mmol/L (ref 22–32)
Calcium: 8.7 mg/dL — ABNORMAL LOW (ref 8.9–10.3)
Chloride: 97 mmol/L — ABNORMAL LOW (ref 98–111)
Creatinine, Ser: 3.12 mg/dL — ABNORMAL HIGH (ref 0.61–1.24)
GFR, Estimated: 21 mL/min — ABNORMAL LOW (ref >60)
Glucose, Bld: 100 mg/dL — ABNORMAL HIGH (ref 70–99)
Potassium: 3.1 mmol/L — ABNORMAL LOW (ref 3.5–5.1)
Sodium: 134 mmol/L — ABNORMAL LOW (ref 135–145)

## 2023-08-07 NOTE — Progress Notes (Signed)
 Hemodialysis Note:  Received patient in bed to unit. Alert and oriented. Informed consent singed and in chart.  Treatment initiated: 1546 Treatment completed: 1850  Access used: Right internal jugular catheter Access issues: None  Patient tolerated well. Transported back to room, alert without acute distress. Report given to patient's RN.  Total UF removed: 1.5 Liters Medications given: None  Post HD weight: 70.7 Kg  [facility]

## 2023-08-07 NOTE — Evaluation (Addendum)
 Physical Therapy Evaluation Patient Details Name: Gary Mccoy. MRN: [id number] DOB: 27-Mar-1950 Today's Date: 08/07/2023  History of Present Illness  Pt is a 73 yo male that presented to the ED for SOB. Had permcath placed this admission for HD.  Pmh of  combined heart failure, COPD, CKD stage IV, hypertension, hyperlipidemia, history of CVA, dilated cardiomyopathy, history of CAD status post PCI with DES in 2023, PAD, on oral anticoagulation.   Clinical Impression  Pt A&Ox4, denied pain. 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 He was able to perform bed mobility, transfers, and ambulation modI. Some decreased gait velocity noted, and pt with complaints of generalized deconditioning, including leg strength. If agreeable he would benefit from outpt PT services to maximize strength, balance, and establishment of HEP.        If plan is discharge home, recommend the following: Other (comment) (NA)   Can travel by private vehicle        Equipment Recommendations None recommended by PT  Recommendations for Other Services       Functional Status Assessment Patient has had a recent decline in their functional status and demonstrates the ability to make significant improvements in function in a reasonable and predictable amount of time.     Precautions / Restrictions Precautions Precautions: Fall Recall of Precautions/Restrictions: Intact Restrictions Weight Bearing Restrictions Per Provider Order: No      Mobility  Bed Mobility Overal bed mobility: Modified Independent                  Transfers Overall transfer level: Independent Equipment used: None Transfers: Sit to/from Stand                  Ambulation/Gait Ambulation/Gait assistance: Modified independent (Device/Increase time) Gait Distance (Feet): 220 Feet Assistive device: None   Gait velocity: decreased         Stairs            Wheelchair Mobility     Tilt Bed    Modified Rankin (Stroke Patients Only)       Balance Overall balance assessment: No apparent balance deficits (not formally assessed) Sitting-balance support: Feet supported, No upper extremity supported Sitting balance-Leahy Scale: Normal     Standing balance support: No upper extremity supported, During functional activity Standing balance-Leahy Scale: Good                               Pertinent Vitals/Pain Pain Assessment Pain Assessment: No/denies pain    Home Living Family/patient expects to be discharged to:: Private residence Living Arrangements: Spouse/significant other Available Help at Discharge: Family;Available 24 hours/day Type of Home: House Home Access: Stairs to enter Entrance Stairs-Rails: None Entrance Stairs-Number of Steps: 2 Alternate Level Stairs-Number of Steps: 12 Home Layout: Two level;Able to live on main level with bedroom/bathroom Home Equipment: Cane - Programmer, applications (2 wheels);Wheelchair - manual Additional Comments: denied falls (DME from father)    Prior Function Prior Level of Function : Independent/Modified Independent;Driving             Mobility Comments: pt reported when hes not at the hospital he does for himself, he does lawn work       Extremity/Trunk Assessment   Upper Extremity Assessment Upper Extremity Assessment: Overall WFL for tasks assessed    Lower Extremity Assessment Lower Extremity  Assessment: Overall WFL for tasks assessed RLE Deficits / Details: HX r-sided CVA, gross strength 4/5, RLE Coordination: decreased gross motor       Communication        Cognition Arousal: Alert Behavior During Therapy: WFL for tasks assessed/performed   PT - Cognitive impairments: No apparent impairments                         Following commands: Intact       Cueing       General Comments      Exercises      Assessment/Plan    PT Assessment Patient needs continued PT services  PT Problem List Decreased balance;Decreased mobility;Decreased activity tolerance;Decreased strength       PT Treatment Interventions Balance training;DME instruction;Gait training;Neuromuscular re-education;Stair training;Functional mobility training;Patient/family education;Therapeutic activities;Therapeutic exercise    PT Goals (Current goals can be found in the Care Plan section)  Acute Rehab PT Goals Patient Stated Goal: to return home PT Goal Formulation: With patient Time For Goal Achievement: 08/21/23 Potential to Achieve Goals: Good Additional Goals Additional Goal #1: The patient will ambulate >1032ft during 6 MWT to indicate unlimited community ambulator Additional Goal #2: The patient will demonstrate a BERG score of at least 54 to indicate decreased falls risk    Frequency Min 1X/week     Co-evaluation               AM-PAC PT "6 Clicks" Mobility  Outcome Measure Help needed turning from your back to your side while in a flat bed without using bedrails?: None Help needed moving from lying on your back to sitting on the side of a flat bed without using bedrails?: None Help needed moving to and from a bed to a chair (including a wheelchair)?: None Help needed standing up from a chair using your arms (e.g., wheelchair or bedside chair)?: None Help needed to walk in hospital room?: None Help needed climbing 3-5 steps with a railing? : None 6 Click Score: 24    End of Session Equipment Utilized During Treatment: Gait belt Activity Tolerance: Patient tolerated treatment well Patient left: Other (comment) (seated EOB, per RN pt has been refusing bed alarm) Nurse Communication: Mobility status PT Visit Diagnosis: Muscle weakness (generalized) (M62.81)    Time: 0950-1000 PT Time Calculation (min) (ACUTE ONLY): 10 min   Charges:   PT Evaluation $PT Eval Low Complexity: 1 Low   PT  General Charges $$ ACUTE PT VISIT: 1 Visit         Darien Eden PT, DPT 10:12 AM,08/07/23

## 2023-08-07 NOTE — Progress Notes (Signed)
 [facility]  ROUNDING NOTE   Subjective:  [name] was admitted to [facility] on 08/03/2023 with recurrent shortness of breath and weight gain.  Patient is followed by Dr Gary Mccoy outpatient.   Patient well-known to us  from last hospitalization where he was significantly fluid overloaded and initially started on dialysis. He did have some renal recovery though his urine output was a bit lower than expected close to his discharge.  He was seen back in the office on 07/28/2023. Since then he has gained additional weight. Upon readmission patient restarted on Lasix  drip.  Update:   Patient seen sitting up in bed Currently eating breakfast Room air No lower extremity edema    Objective:  Vital signs in last 24 hours:  Temp:  [97.2 F (36.2 C)-98.5 F (36.9 C)] 98 F (36.7 C) (05/22 0807) Pulse Rate:  [54-76] 54 (05/22 0807) Resp:  [11-21] 18 (05/22 0807) BP: (114-187)/(53-73) 132/72 (05/22 0807) SpO2:  [92 %-98 %] 94 % (05/22 0807)  Weight change:  Filed Weights   08/03/23 1650 08/06/23 0801 08/06/23 1057  Weight: 83 kg 75 kg 74 kg    Intake/Output: I/O last 3 completed shifts: In: 35.9 [I.V.:35.9] Out: 3500 [Urine:2500; Other:1000]   Intake/Output this shift:  Total I/O In: 240 [P.O.:240] Out: -   Physical Exam: General: No acute distress.  Head: Normocephalic, atraumatic. Moist oral mucosal membranes  Eyes: Anicteric  Lungs:  Basilar rales, normal effort  Heart: Regular rate and rhythm  Abdomen:  Soft, nontender  Extremities: Trace peripheral edema.  Neurologic: Alert, moving all four extremities  Skin: No lesions  Access: Rt internal jugular permcath    Basic Metabolic Panel: Recent Labs  Lab 08/03/23 1424 08/03/23 1954 08/04/23 0305 08/04/23 0742 08/05/23 0721 08/05/23 0904 08/06/23 0811 08/06/23 1416 08/07/23 0552  NA 133*   < > 133*  --   --  133* 133* 132* 134*  K 5.3*   < > 4.2   < > 3.1* 3.0* 2.7* 3.1* 3.1*  CL 98   <  > 96*  --   --  93* 91* 95* 97*  CO2 22   < > 23  --   --  26 28 24 27   GLUCOSE 132*   < > 123*  --   --  89 83 158* 100*  BUN 89*   < > 91*  --   --  109* 106* 55* 66*  CREATININE 4.16*   < > 4.19*  --   --  3.88* 3.60* 2.56* 3.12*  CALCIUM  9.1   < > 9.1  --   --  8.9 8.9 8.6* 8.7*  MG 2.0  --   --   --   --   --   --   --  1.9  PHOS 2.9  --   --   --   --   --  4.9*  --  3.2   < > = values in this interval not displayed.    Liver Function Tests: Recent Labs  Lab 08/03/23 1424 08/04/23 1524 08/06/23 0811  AST 35  --   --   ALT 47*  --   --   ALKPHOS 57  --   --   BILITOT 1.3*  --   --   PROT 7.5  --   --   ALBUMIN 3.8 3.2* 3.3*   No results for input(s): "LIPASE", "AMYLASE" in the last 168 hours. No results for input(s): "AMMONIA" in  the last 168 hours.  CBC: Recent Labs  Lab 08/03/23 1424 08/04/23 0305 08/06/23 0811  WBC 11.6* 5.8 6.7  HGB 10.5* 9.8* 9.4*  HCT 31.9* 29.7* 27.8*  MCV 88.6 87.6 84.2  PLT 155 129* 174    Cardiac Enzymes: No results for input(s): "CKTOTAL", "CKMB", "CKMBINDEX", "TROPONINI" in the last 168 hours.  BNP: Invalid input(s): "POCBNP"  CBG: Recent Labs  Lab 08/03/23 1652 08/03/23 2023  GLUCAP 142* 165*     Microbiology: Results for orders placed or performed during the hospital encounter of 08/03/23  MRSA Next Gen by PCR, Nasal     Status: None   Collection Time: 08/03/23  4:52 PM   Specimen: Nasal Mucosa; Nasal Swab  Result Value Ref Range Status   MRSA by PCR Next Gen NOT DETECTED NOT DETECTED Final    Comment: (NOTE) The GeneXpert MRSA Assay (FDA approved for NASAL specimens only), is one component of a comprehensive MRSA colonization surveillance program. It is not intended to diagnose MRSA infection nor to guide or monitor treatment for MRSA infections. Test performance is not FDA approved in patients less than 24 years old. Performed at [facility], [address] [place], [place] [address]      Coagulation Studies: No results for input(s): "LABPROT", "INR" in the last 72 hours.   Urinalysis: No results for input(s): "COLORURINE", "LABSPEC", "PHURINE", "GLUCOSEU", "HGBUR", "BILIRUBINUR", "KETONESUR", "PROTEINUR", "UROBILINOGEN", "NITRITE", "LEUKOCYTESUR" in the last 72 hours.  Invalid input(s): "APPERANCEUR"    Imaging: PERIPHERAL VASCULAR CATHETERIZATION Result Date: 08/05/2023 See surgical note for result.      Medications:       allopurinol   100 mg Oral Daily   amLODipine   5 mg Oral BID   apixaban   2.5 mg Oral BID   Chlorhexidine  Gluconate Cloth  6 each Topical Q0600   cholecalciferol   1,000 Units Oral Daily   cloNIDine   0.1 mg Oral BID   furosemide   80 mg Oral Daily   hydrALAZINE   100 mg Oral TID   isosorbide  mononitrate  60 mg Oral Daily   pantoprazole   40 mg Oral Daily   rosuvastatin   10 mg Oral QHS   acetaminophen  **OR** acetaminophen , hydrALAZINE , nitroGLYCERIN , ondansetron  **OR** ondansetron  (ZOFRAN ) IV, senna-docusate  Assessment/ Plan:  [name] is a 73 y.o.  male with PMH of HFrEF,CAD, PVD, CVA, COPD, stage IV CKD who is admitted to [facility] on 06/21/2022 for Hyperkalemia [E87.5] AKI (acute kidney injury) (HCC) [N17.9] Acute on chronic congestive heart failure, unspecified heart failure type (HCC) [I50.9] Acute hypoxic respiratory failure (HCC) [J96.01]  Acute Kidney Injury on chronic kidney disease stage IV 2/2 to acute cardiorenal syndrome: baseline creatinine EGFR of 18.. 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 Renal ultrasound shows atrophic right kidney.  Appreciate vascular placing rt internal jugular permcath on 5/20.  Patient will receive second dialysis treatment today.  Will begin outpatient dialysis clinic search, [facility] and [address]   Lab Results  Component Value Date   CREATININE 3.12 (H) 08/07/2023   CREATININE 2.56 (H) 08/06/2023   CREATININE 3.60 (H) 08/06/2023      Intake/Output Summary (Last 24 hours) at 08/07/2023 1149 Last data filed at 08/07/2023 1100 Gross per 24 hour  Intake 257.9 ml  Output 600 ml  Net -342.1 ml      Acute on chronic systolic and diastolic heart failure. Urine output 3.1L recorded in past 24 hours. Continue oral furosemide  80mg  daily.   3. Anemia with chronic kidney disease:  Hemoglobin down to 9.4.  Will retreive updated labs with dialysis.       LOS: 4 [place]ia Miers 5/22/202511:49 AM

## 2023-08-07 NOTE — Progress Notes (Signed)
 PROGRESS NOTE    Gary Mccoy.  EAV:[id number] DOB: [birth year] DOA: 08/03/2023 PCP: Sari Cunning, MD    Brief Narrative:  73 year old male with history of combined heart failure, COPD, CKD stage IV, hypertension, hyperlipidemia, history of CVA, dilated cardiomyopathy, history of CAD status post PCI with DES in 2023, PAD, on oral anticoagulation, who presents emergency department for chief concerns of shortness of breath.   Furosemide  80 mg IV one-time dose, DuoNebs one-time treatment.  EDP discussed with nephrology, who is aware of patient and recommends furosemide  gtt. as well.   Per ED provider, patient has been admitted for similar in the past and had apparent plans for CRRT.  Patient does not have hemodialysis access at this time.  Assessment & Plan:   Principal Problem:   Acute hypoxic respiratory failure (HCC) Active Problems:   Acute on chronic respiratory failure with hypoxia (HCC)   Acute renal failure superimposed on stage 4 chronic kidney disease (HCC)   PAD s/p aortobifemoral bypass 2017 (peripheral artery disease)   Benign essential HTN   Leukocytosis   Hyperlipidemia   CVA (cerebral vascular accident) (HCC)   Acute exacerbation of CHF (congestive heart failure) (HCC)   CKD (chronic kidney disease) stage 4, GFR 15-29 ml/min (HCC)   CAD S/P percutaneous coronary angioplasty   Encounter for dialysis and dialysis catheter care [facility])  Acute hypoxic respiratory failure (HCC) Suspect secondary to heart failure exacerbation BPAP weaned off Now weaned off oxygen  HFpEF Volume mgmt primarily with dialysis now, oral lasix , too. Appears euvolemic     Acute renal failure superimposed on stage 4 chronic kidney disease (HCC) Progressed to ESRD Plan: PermCath placement with vascular surgery 5/20. Tolerated dialysis 5/21, plan for another session today  Hyperlipidemia Continue statin  Benign essential HTN Continue home Imdur  Continue home hydralazine  Continue  home amlodipine  Continue home clonidine  As needed IV hydralazine   Hypokalemia Replete with dialysis  Debility PT advising outpt PT  DVT prophylaxis: Eliquis  Code Status: FULL Family Communication wife updated @ bedside 5/22, shared decision for intermittent updates provided patient remains stable Disposition Plan: Status is: Inpatient Remains inpatient appropriate because: pending outpatient dialysis arrangements   Level of care: Telemetry Medical  Consultants:  Nephrology Vascular surgery  Procedures:  Dialysis catheter placement  Antimicrobials: None   Subjective: Seen and examined resting in bed.  Feeling fine, no dyspnea  Objective: Vitals:   08/06/23 2349 08/07/23 0316 08/07/23 0807 08/07/23 1208  BP: (!) 144/65 139/71 132/72 134/67  Pulse: 66 66 (!) 54 66  Resp: 16 16 18 18   Temp: 98.2 F (36.8 C) 98.2 F (36.8 C) 98 F (36.7 C) (!) 97.5 F (36.4 C)  TempSrc: Oral  Oral   SpO2: 98% 96% 94% 100%  Weight:      Height:        Intake/Output Summary (Last 24 hours) at 08/07/2023 1216 Last data filed at 08/07/2023 1100 Gross per 24 hour  Intake 257.9 ml  Output 600 ml  Net -342.1 ml   Filed Weights   08/03/23 1650 08/06/23 0801 08/06/23 1057  Weight: 83 kg 75 kg 74 kg    Examination:  General exam: NAD Respiratory system: Lungs clear.  Normal work of breathing.    Cardiovascular system: S1-S2, RRR, no murmurs,   Gastrointestinal system: Soft, NT/ND,  Central nervous system: Alert and oriented. No focal neurological deficits. Extremities: Symmetric 5 x 5 power. No edema Skin: No rashes, lesions or ulcers Psychiatry: Judgement and insight appear normal. Mood &  affect appropriate.     Data Reviewed: I have personally reviewed following labs and imaging studies  CBC: Recent Labs  Lab 08/03/23 1424 08/04/23 0305 08/06/23 0811  WBC 11.6* 5.8 6.7  HGB 10.5* 9.8* 9.4*  HCT 31.9* 29.7* 27.8*  MCV 88.6 87.6 84.2  PLT 155 129* 174   Basic  Metabolic Panel: Recent Labs  Lab 08/03/23 1424 08/03/23 1954 08/04/23 0305 08/04/23 0742 08/05/23 0721 08/05/23 0904 08/06/23 0811 08/06/23 1416 08/07/23 0552  NA 133*   < > 133*  --   --  133* 133* 132* 134*  K 5.3*   < > 4.2   < > 3.1* 3.0* 2.7* 3.1* 3.1*  CL 98   < > 96*  --   --  93* 91* 95* 97*  CO2 22   < > 23  --   --  26 28 24 27   GLUCOSE 132*   < > 123*  --   --  89 83 158* 100*  BUN 89*   < > 91*  --   --  109* 106* 55* 66*  CREATININE 4.16*   < > 4.19*  --   --  3.88* 3.60* 2.56* 3.12*  CALCIUM  9.1   < > 9.1  --   --  8.9 8.9 8.6* 8.7*  MG 2.0  --   --   --   --   --   --   --  1.9  PHOS 2.9  --   --   --   --   --  4.9*  --  3.2   < > = values in this interval not displayed.   GFR: Estimated Creatinine Clearance: 22.7 mL/min (A) (by C-G formula based on SCr of 3.12 mg/dL (H)). Liver Function Tests: Recent Labs  Lab 08/03/23 1424 08/04/23 1524 08/06/23 0811  AST 35  --   --   ALT 47*  --   --   ALKPHOS 57  --   --   BILITOT 1.3*  --   --   PROT 7.5  --   --   ALBUMIN 3.8 3.2* 3.3*   No results for input(s): "LIPASE", "AMYLASE" in the last 168 hours. No results for input(s): "AMMONIA" in the last 168 hours. Coagulation Profile: No results for input(s): "INR", "PROTIME" in the last 168 hours. Cardiac Enzymes: No results for input(s): "CKTOTAL", "CKMB", "CKMBINDEX", "TROPONINI" in the last 168 hours. BNP (last 3 results) No results for input(s): "PROBNP" in the last 8760 hours. HbA1C: No results for input(s): "HGBA1C" in the last 72 hours. CBG: Recent Labs  Lab 08/03/23 1652 08/03/23 2023  GLUCAP 142* 165*   Lipid Profile: No results for input(s): "CHOL", "HDL", "LDLCALC", "TRIG", "CHOLHDL", "LDLDIRECT" in the last 72 hours. Thyroid  Function Tests: No results for input(s): "TSH", "T4TOTAL", "FREET4", "T3FREE", "THYROIDAB" in the last 72 hours. Anemia Panel: No results for input(s): "VITAMINB12", "FOLATE", "FERRITIN", "TIBC", "IRON", "RETICCTPCT"  in the last 72 hours. Sepsis Labs: No results for input(s): "PROCALCITON", "LATICACIDVEN" in the last 168 hours.  Recent Results (from the past 240 hours)  MRSA Next Gen by PCR, Nasal     Status: None   Collection Time: 08/03/23  4:52 PM   Specimen: Nasal Mucosa; Nasal Swab  Result Value Ref Range Status   MRSA by PCR Next Gen NOT DETECTED NOT DETECTED Final    Comment: (NOTE) The GeneXpert MRSA Assay (FDA approved for NASAL specimens only), is one component of a comprehensive MRSA colonization surveillance  program. It is not intended to diagnose MRSA infection nor to guide or monitor treatment for MRSA infections. Test performance is not FDA approved in patients less than 19 years old. Performed at [facility], [address] [place], [place] [address]          Radiology Studies: PERIPHERAL VASCULAR CATHETERIZATION Result Date: 08/05/2023 See surgical note for result.       Scheduled Meds:  allopurinol   100 mg Oral Daily   amLODipine   5 mg Oral BID   apixaban   2.5 mg Oral BID   Chlorhexidine  Gluconate Cloth  6 each Topical Q0600   cholecalciferol   1,000 Units Oral Daily   cloNIDine   0.1 mg Oral BID   furosemide   80 mg Oral Daily   hydrALAZINE   100 mg Oral TID   isosorbide  mononitrate  60 mg Oral Daily   pantoprazole   40 mg Oral Daily   rosuvastatin   10 mg Oral QHS   Continuous Infusions:     LOS: 4 days    Raymonde Calico, MD [facility]   If 7PM-7AM, please contact night-coverage  08/07/2023, 12:16 PM

## 2023-08-07 NOTE — Progress Notes (Signed)
  Progress Note    08/07/2023 10:21 AM 2 Days Post-Op  Subjective:  Gary Mccoy is a 73 yo male who is now POD #2 from placement of a tunneled dialysis catheter for access for this.  Patient is resting comfortably sitting in bedside recliner this morning.  Patient has no complaints overnight.  Patient does endorse that the area is sore but manageable.  Vitals all remained stable.   Vitals:   08/07/23 0316 08/07/23 0807  BP: 139/71 132/72  Pulse: 66 (!) 54  Resp: 16 18  Temp: 98.2 F (36.8 C) 98 F (36.7 C)  SpO2: 96% 94%   Physical Exam: Cardiac:  RRR, normal S1 and S2.  No rubs clicks gallops or murmurs noted. Lungs: Lungs clear throughout on auscultation, normal breathing.  No rales rhonchi or wheezing. Incisions: Right chest incision for entrance of tunneled catheter.  Dressings clean dry and intact.  Hematoma seroma or infection to note.  Area is erythemic. Extremities: Palpable pulses throughout. Abdomen: Positive bowel sounds throughout, soft, nontender and nondistended. Neurologic: Alert and oriented x 4, answers all questions and follows commands appropriately.  CBC    Component Value Date/Time   WBC 6.7 08/06/2023 0811   RBC 3.30 (L) 08/06/2023 0811   HGB 9.4 (L) 08/06/2023 0811   HCT 27.8 (L) 08/06/2023 0811   PLT 174 08/06/2023 0811   MCV 84.2 08/06/2023 0811   MCH 28.5 08/06/2023 0811   MCHC 33.8 08/06/2023 0811   RDW 15.1 08/06/2023 0811   LYMPHSABS 0.6 (L) 07/15/2023 0433   MONOABS 1.1 (H) 07/15/2023 0433   EOSABS 0.0 07/15/2023 0433   BASOSABS 0.0 07/15/2023 0433    BMET    Component Value Date/Time   NA 134 (L) 08/07/2023 0552   K 3.1 (L) 08/07/2023 0552   CL 97 (L) 08/07/2023 0552   CO2 27 08/07/2023 0552   GLUCOSE 100 (H) 08/07/2023 0552   BUN 66 (H) 08/07/2023 0552   CREATININE 3.12 (H) 08/07/2023 0552   CALCIUM  8.7 (L) 08/07/2023 0552   GFRNONAA 21 (L) 08/07/2023 0552   GFRAA 48 (L) 02/26/2018 1359    INR    Component Value  Date/Time   INR 1.2 07/11/2023 1045     Intake/Output Summary (Last 24 hours) at 08/07/2023 1021 Last data filed at 08/06/2023 2300 Gross per 24 hour  Intake 17.9 ml  Output 2200 ml  Net -2182.1 ml     Assessment/Plan:  73 y.o. male is s/p tunneled dialysis catheter access for hemodialysis.  2 Days Post-Op   PLAN Okay per vascular surgery for dialysis to use new tunneled access for hemodialysis.  No complications to note post insertion. Vascular surgery will sign off this case at this time.  DVT prophylaxis: Heparin  with dialysis.   Annamaria Barrette [facility] 08/07/2023 10:21 AM

## 2023-08-08 DIAGNOSIS — J9601 Acute respiratory failure with hypoxia: Secondary | ICD-10-CM | POA: Diagnosis not present

## 2023-08-08 LAB — BASIC METABOLIC PANEL WITH GFR
Anion gap: 9 (ref 5–15)
BUN: 33 mg/dL — ABNORMAL HIGH (ref 8–23)
CO2: 27 mmol/L (ref 22–32)
Calcium: 8.6 mg/dL — ABNORMAL LOW (ref 8.9–10.3)
Chloride: 97 mmol/L — ABNORMAL LOW (ref 98–111)
Creatinine, Ser: 2.52 mg/dL — ABNORMAL HIGH (ref 0.61–1.24)
GFR, Estimated: 27 mL/min — ABNORMAL LOW (ref >60)
Glucose, Bld: 106 mg/dL — ABNORMAL HIGH (ref 70–99)
Potassium: 3.6 mmol/L (ref 3.5–5.1)
Sodium: 133 mmol/L — ABNORMAL LOW (ref 135–145)

## 2023-08-08 MED ORDER — EPOETIN ALFA-EPBX 10000 UNIT/ML IJ SOLN
4000.0000 [IU] | INTRAMUSCULAR | Status: DC
  Administered 2023-08-09 – 2023-08-12 (×2): 4000 [IU] via INTRAVENOUS
  Filled 2023-08-08: qty 2

## 2023-08-08 NOTE — Progress Notes (Signed)
 Gary Mccoy  Daily Progress Note   Subjective  - 3 Days Post-Op  Sitting up in a chair when I entered the room.  Alert in good spirits.  Denies neck or chest wall pain.  Notes that the catheter has worked twice now without any difficulties.  Objective Vitals:   08/08/23 0431 08/08/23 0927 08/08/23 1236 08/08/23 1607  BP: 123/65 (!) 126/57 104/60 135/71  Pulse: 63 65 65 65  Resp: 18 18 14 16   Temp: 97.8 F (36.6 C) 98.6 F (37 C)  97.6 F (36.4 C)  TempSrc:  Oral    SpO2: 99% 100% 100% 100%  Weight:      Height:        Intake/Output Summary (Last 24 hours) at 08/08/2023 1624 Last data filed at 08/08/2023 1357 Gross per 24 hour  Intake 480 ml  Output 1500 ml  Net -1020 ml    PULM  Normal effort , no use of accessory muscles CV  No JVD, RRR Abd      No distended, nontender VASC  right IJ tunnel catheter with minimal ecchymoses and no significant edema.  Dressing clean dry and intact  Laboratory CBC    Component Value Date/Time   WBC 6.8 08/07/2023 1609   HGB 9.3 (L) 08/07/2023 1609   HCT 27.5 (L) 08/07/2023 1609   PLT 181 08/07/2023 1609    BMET    Component Value Date/Time   NA 133 (L) 08/08/2023 0239   K 3.6 08/08/2023 0239   CL 97 (L) 08/08/2023 0239   CO2 27 08/08/2023 0239   GLUCOSE 106 (H) 08/08/2023 0239   BUN 33 (H) 08/08/2023 0239   CREATININE 2.52 (H) 08/08/2023 0239   CALCIUM  8.6 (L) 08/08/2023 0239   GFRNONAA 27 (L) 08/08/2023 0239   GFRAA 48 (L) 02/26/2018 1359    Assessment/Planning: POD # 2 s/p right IJ catheter complicated with mediastinal hematoma Patient is doing very well he has been hemodynamically stable throughout and his labs remain stable as well.  He will follow-up with me in the office for evaluation of upper extremity access after discharge.   Gary Mccoy  08/08/2023, 4:24 PM

## 2023-08-08 NOTE — Plan of Care (Signed)

## 2023-08-08 NOTE — Progress Notes (Signed)
 Physical Therapy Treatment Patient Details Name: Gary Mccoy Goldsmith. MRN: [id number] DOB: 04/21/1950 Today's Date: 08/08/2023   History of Present Illness Pt is a 73 yo male that presented to the ED for SOB. Had permcath placed this admission for HD.  Pmh of  combined heart failure, COPD, CKD stage IV, hypertension, hyperlipidemia, history of CVA, dilated cardiomyopathy, history of CAD status post PCI with DES in 2023, PAD, on oral anticoagulation.    PT Comments  Pt received up in chair, voices aggravation regarding wanting a shower and not knowing how to care for dialysis port. Pt's concerns addressed, request sent for order and nursing to assist with shower. Pt continues to demonstrate ModI for transfers and bed mobility. No LOB or risk for falls during hallway ambulation without AD and Supervision. Pt remained on RA with SpO2 above 96% upon exertion. Will continue to follow per POC acutely.    If plan is discharge home, recommend the following: Other (comment)   Can travel by private vehicle        Equipment Recommendations  None recommended by PT    Recommendations for Other Services       Precautions / Restrictions Precautions Precautions: Fall Recall of Precautions/Restrictions: Intact Precaution/Restrictions Comments: he denies any falls in last year, but appears impulsive. Restrictions Weight Bearing Restrictions Per Provider Order: No     Mobility  Bed Mobility Overal bed mobility: Modified Independent                  Transfers Overall transfer level: Independent Equipment used: None Transfers: Sit to/from Stand                  Ambulation/Gait Ambulation/Gait assistance: Modified independent (Device/Increase time) Gait Distance (Feet): 260 Feet Assistive device: None Gait Pattern/deviations: Step-through pattern, Decreased step length - right, Decreased step length - left, Drifts right/left Gait velocity: decreased     General Gait Details:  improved steadiness spO2 >96%   Stairs             Wheelchair Mobility     Tilt Bed    Modified Rankin (Stroke Patients Only)       Balance Overall balance assessment: No apparent balance deficits (not formally assessed)                                          Communication Communication Communication: No apparent difficulties  Cognition Arousal: Alert Behavior During Therapy: WFL for tasks assessed/performed   PT - Cognitive impairments: No apparent impairments                       PT - Cognition Comments: frustated by current state but oriented, some questionable safety decisions (doffed O2 immediately, ready to ambulate without) Following commands: Intact Following commands impaired: Follows multi-step commands with increased time    Cueing Cueing Techniques: Verbal cues  Exercises      General Comments General comments (skin integrity, edema, etc.): Pt educated on role of PT, current functional level and plans to d/c home with out pt dialysis. Addressed all questions and concerns      Pertinent Vitals/Pain Pain Assessment Pain Assessment: No/denies pain    Home Living                          Prior Function  PT Goals (current goals can now be found in the care plan section) Acute Rehab PT Goals Patient Stated Goal: to return home Progress towards PT goals: Progressing toward goals    Frequency    Min 1X/week      PT Plan      Co-evaluation              AM-PAC PT "6 Clicks" Mobility   Outcome Measure  Help needed turning from your back to your side while in a flat bed without using bedrails?: None Help needed moving from lying on your back to sitting on the side of a flat bed without using bedrails?: None Help needed moving to and from a bed to a chair (including a wheelchair)?: None Help needed standing up from a chair using your arms (e.g., wheelchair or bedside chair)?:  None Help needed to walk in hospital room?: None Help needed climbing 3-5 steps with a railing? : None 6 Click Score: 24    End of Session Equipment Utilized During Treatment: Gait belt   Patient left: in chair;with call bell/phone within reach Nurse Communication: Mobility status PT Visit Diagnosis: Muscle weakness (generalized) (M62.81)     Time: 8295-6213 PT Time Calculation (min) (ACUTE ONLY): 21 min  Charges:    $Therapeutic Activity: 8-22 mins PT General Charges $$ ACUTE PT VISIT: 1 Visit                    Melvyn Stagers, PTA  Diona Franklin 08/08/2023, 3:36 PM

## 2023-08-08 NOTE — Progress Notes (Signed)
 [facility]  ROUNDING NOTE   Subjective:  [name] was admitted to [facility] on 08/03/2023 with recurrent shortness of breath and weight gain.  Patient is followed by Dr Gary Mccoy outpatient.   Patient well-known to us  from last hospitalization where he was significantly fluid overloaded and initially started on dialysis. He did have some renal recovery though his urine output was a bit lower than expected close to his discharge.  He was seen back in the office on 07/28/2023. Since then he has gained additional weight. Upon readmission patient restarted on Lasix  drip.  Update:   Patient sitting up in chair States he feels well today Alert and oriented Currently eating breakfast Room air No lower extremity edema    Objective:  Vital signs in last 24 hours:  Temp:  [97.8 F (36.6 C)-98.6 F (37 C)] 98.6 F (37 C) (05/23 0927) Pulse Rate:  [62-73] 65 (05/23 1236) Resp:  [12-24] 14 (05/23 1236) BP: (104-161)/(57-104) 104/60 (05/23 1236) SpO2:  [96 %-100 %] 100 % (05/23 1236) Weight:  [70.7 kg-72.2 kg] 70.7 kg (05/22 1850)  Weight change: -2.8 kg Filed Weights   08/06/23 1057 08/07/23 1535 08/07/23 1850  Weight: 74 kg 72.2 kg 70.7 kg    Intake/Output: I/O last 3 completed shifts: In: 580 [P.O.:580] Out: 2100 [Urine:600; Other:1500]   Intake/Output this shift:  Total I/O In: 240 [P.O.:240] Out: -   Physical Exam: General: No acute distress.  Head: Normocephalic, atraumatic. Moist oral mucosal membranes  Eyes: Anicteric  Lungs:  Basilar rales, normal effort  Heart: Regular rate and rhythm  Abdomen:  Soft, nontender  Extremities: No peripheral edema.  Neurologic: Alert, moving all four extremities  Skin: No lesions  Access: Rt internal jugular permcath    Basic Metabolic Panel: Recent Labs  Lab 08/03/23 1424 08/03/23 1954 08/05/23 0904 08/06/23 0811 08/06/23 1416 08/07/23 0552 08/08/23 0239  NA 133*   < > 133* 133* 132* 134* 133*   K 5.3*   < > 3.0* 2.7* 3.1* 3.1* 3.6  CL 98   < > 93* 91* 95* 97* 97*  CO2 22   < > 26 28 24 27 27   GLUCOSE 132*   < > 89 83 158* 100* 106*  BUN 89*   < > 109* 106* 55* 66* 33*  CREATININE 4.16*   < > 3.88* 3.60* 2.56* 3.12* 2.52*  CALCIUM  9.1   < > 8.9 8.9 8.6* 8.7* 8.6*  MG 2.0  --   --   --   --  1.9  --   PHOS 2.9  --   --  4.9*  --  3.2  --    < > = values in this interval not displayed.    Liver Function Tests: Recent Labs  Lab 08/03/23 1424 08/04/23 1524 08/06/23 0811  AST 35  --   --   ALT 47*  --   --   ALKPHOS 57  --   --   BILITOT 1.3*  --   --   PROT 7.5  --   --   ALBUMIN 3.8 3.2* 3.3*   No results for input(s): "LIPASE", "AMYLASE" in the last 168 hours. No results for input(s): "AMMONIA" in the last 168 hours.  CBC: Recent Labs  Lab 08/03/23 1424 08/04/23 0305 08/06/23 0811 08/07/23 1609  WBC 11.6* 5.8 6.7 6.8  HGB 10.5* 9.8* 9.4* 9.3*  HCT 31.9* 29.7* 27.8* 27.5*  MCV 88.6 87.6 84.2 85.1  PLT 155 129*  174 181    Cardiac Enzymes: No results for input(s): "CKTOTAL", "CKMB", "CKMBINDEX", "TROPONINI" in the last 168 hours.  BNP: Invalid input(s): "POCBNP"  CBG: Recent Labs  Lab 08/03/23 1652 08/03/23 2023  GLUCAP 142* 165*     Microbiology: Results for orders placed or performed during the hospital encounter of 08/03/23  MRSA Next Gen by PCR, Nasal     Status: None   Collection Time: 08/03/23  4:52 PM   Specimen: Nasal Mucosa; Nasal Swab  Result Value Ref Range Status   MRSA by PCR Next Gen NOT DETECTED NOT DETECTED Final    Comment: (NOTE) The GeneXpert MRSA Assay (FDA approved for NASAL specimens only), is one component of a comprehensive MRSA colonization surveillance program. It is not intended to diagnose MRSA infection nor to guide or monitor treatment for MRSA infections. Test performance is not FDA approved in patients less than 6 years old. Performed at [facility], [address] [place], [place] [address]      Coagulation Studies: No results for input(s): "LABPROT", "INR" in the last 72 hours.   Urinalysis: No results for input(s): "COLORURINE", "LABSPEC", "PHURINE", "GLUCOSEU", "HGBUR", "BILIRUBINUR", "KETONESUR", "PROTEINUR", "UROBILINOGEN", "NITRITE", "LEUKOCYTESUR" in the last 72 hours.  Invalid input(s): "APPERANCEUR"    Imaging: No results found.      Medications:       allopurinol   100 mg Oral Daily   amLODipine   5 mg Oral BID   apixaban   2.5 mg Oral BID   Chlorhexidine  Gluconate Cloth  6 each Topical Q0600   cholecalciferol   1,000 Units Oral Daily   cloNIDine   0.1 mg Oral BID   furosemide   80 mg Oral Daily   hydrALAZINE   100 mg Oral TID   isosorbide  mononitrate  60 mg Oral Daily   pantoprazole   40 mg Oral Daily   rosuvastatin   10 mg Oral QHS   acetaminophen  **OR** acetaminophen , hydrALAZINE , nitroGLYCERIN , ondansetron  **OR** ondansetron  (ZOFRAN ) IV, senna-docusate  Assessment/ Plan:  [name] is a 73 y.o.  male with PMH of HFrEF,CAD, PVD, CVA, COPD, stage IV CKD who is admitted to [facility] on 06/21/2022 for Hyperkalemia [E87.5] AKI (acute kidney injury) (HCC) [N17.9] Acute on chronic congestive heart failure, unspecified heart failure type (HCC) [I50.9] Acute hypoxic respiratory failure (HCC) [J96.01]  Acute Kidney Injury on chronic kidney disease stage IV 2/2 to acute cardiorenal syndrome: baseline creatinine EGFR of 18.. 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 Renal ultrasound shows atrophic right kidney.  Appreciate vascular placing rt internal jugular permcath on 5/20.  Next treatment scheduled for Saturday. Awaiting outpatient dialysis clinic search, [facility] [place] and [address]   Lab Results  Component Value Date   CREATININE 2.52 (H) 08/08/2023   CREATININE 3.12 (H) 08/07/2023   CREATININE 2.56 (H) 08/06/2023     Intake/Output Summary (Last 24 hours) at 08/08/2023 1243 Last data filed at 08/08/2023 1019 Gross  per 24 hour  Intake 580 ml  Output 1500 ml  Net -920 ml      Acute on chronic systolic and diastolic heart failure. Continue oral furosemide  80mg  daily.   3. Anemia with chronic kidney disease: Hemoglobin 9.3.  Will order low dose epo with dialysis.       LOS: 5 Jalesia Loudenslager 5/23/202512:43 PM

## 2023-08-08 NOTE — Progress Notes (Signed)
 PROGRESS NOTE    Gary Mccoy.  ZOX:[id number] DOB: 03-14-1951 DOA: 08/03/2023 PCP: Sari Cunning, MD    Brief Narrative:  73 year old male with history of combined heart failure, COPD, CKD stage IV, hypertension, hyperlipidemia, history of CVA, dilated cardiomyopathy, history of CAD status post PCI with DES in 2023, PAD, on oral anticoagulation, who presents emergency department for chief concerns of shortness of breath.   Furosemide  80 mg IV one-time dose, DuoNebs one-time treatment.  EDP discussed with nephrology, who is aware of patient and recommends furosemide  gtt. as well.   Per ED provider, patient has been admitted for similar in the past and had apparent plans for CRRT.  Patient does not have hemodialysis access at this time.  Assessment & Plan:   Principal Problem:   Acute hypoxic respiratory failure (HCC) Active Problems:   Acute on chronic respiratory failure with hypoxia (HCC)   Acute renal failure superimposed on stage 4 chronic kidney disease (HCC)   PAD s/p aortobifemoral bypass 2017 (peripheral artery disease)   Benign essential HTN   Leukocytosis   Hyperlipidemia   CVA (cerebral vascular accident) (HCC)   Acute exacerbation of CHF (congestive heart failure) (HCC)   CKD (chronic kidney disease) stage 4, GFR 15-29 ml/min (HCC)   CAD S/P percutaneous coronary angioplasty   Encounter for dialysis and dialysis catheter care [facility])  Acute hypoxic respiratory failure (HCC) Suspect secondary to heart failure exacerbation BPAP weaned off Now weaned off oxygen  HFpEF Volume mgmt primarily with dialysis now, oral lasix , too. Appears euvolemic     Acute renal failure superimposed on stage 4 chronic kidney disease (HCC) Progressed to ESRD Plan: PermCath placement with vascular surgery 5/20. Tolerated dialysis 5/21 and 5/22, next session planned for tomorrow  Hyperlipidemia Continue statin  Benign essential HTN Well controlled, low normal Continue home  Imdur  Hold home hydralazine  Continue home amlodipine  Continue home clonidine  As needed IV hydralazine   Hypokalemia Replete with dialysis  Debility PT advising outpt PT  DVT prophylaxis: Eliquis  Code Status: FULL Family Communication wife updated @ bedside 5/22, shared decision for intermittent updates provided patient remains stable Disposition Plan: Status is: Inpatient Remains inpatient appropriate because: pending outpatient dialysis arrangements   Level of care: Telemetry Medical  Consultants:  Nephrology Vascular surgery  Procedures:  Dialysis catheter placement  Antimicrobials: None   Subjective: Seen and examined resting in chair.  Feeling fine, no dyspnea. Regular BMs, tolerated dialysis yesterday  Objective: Vitals:   08/08/23 0025 08/08/23 0431 08/08/23 0927 08/08/23 1236  BP: 114/71 123/65 (!) 126/57 104/60  Pulse: 71 63 65 65  Resp: 18 18 18 14   Temp: 98 F (36.7 C) 97.8 F (36.6 C) 98.6 F (37 C)   TempSrc:   Oral   SpO2: 98% 99% 100% 100%  Weight:      Height:        Intake/Output Summary (Last 24 hours) at 08/08/2023 1409 Last data filed at 08/08/2023 1357 Gross per 24 hour  Intake 480 ml  Output 1500 ml  Net -1020 ml   Filed Weights   08/06/23 1057 08/07/23 1535 08/07/23 1850  Weight: 74 kg 72.2 kg 70.7 kg    Examination:  General exam: NAD Respiratory system: Lungs clear.  Normal work of breathing.    Cardiovascular system: S1-S2, RRR, no murmurs,   Gastrointestinal system: Soft, NT/ND,  Central nervous system: Alert and oriented. No focal neurological deficits. Extremities: Symmetric 5 x 5 power. No edema Skin: No rashes, lesions or ulcers Psychiatry:  Judgement and insight appear normal. Mood & affect appropriate.     Data Reviewed: I have personally reviewed following labs and imaging studies  CBC: Recent Labs  Lab 08/03/23 1424 08/04/23 0305 08/06/23 0811 08/07/23 1609  WBC 11.6* 5.8 6.7 6.8  HGB 10.5* 9.8* 9.4*  9.3*  HCT 31.9* 29.7* 27.8* 27.5*  MCV 88.6 87.6 84.2 85.1  PLT 155 129* 174 181   Basic Metabolic Panel: Recent Labs  Lab 08/03/23 1424 08/03/23 1954 08/05/23 0904 08/06/23 0811 08/06/23 1416 08/07/23 0552 08/08/23 0239  NA 133*   < > 133* 133* 132* 134* 133*  K 5.3*   < > 3.0* 2.7* 3.1* 3.1* 3.6  CL 98   < > 93* 91* 95* 97* 97*  CO2 22   < > 26 28 24 27 27   GLUCOSE 132*   < > 89 83 158* 100* 106*  BUN 89*   < > 109* 106* 55* 66* 33*  CREATININE 4.16*   < > 3.88* 3.60* 2.56* 3.12* 2.52*  CALCIUM  9.1   < > 8.9 8.9 8.6* 8.7* 8.6*  MG 2.0  --   --   --   --  1.9  --   PHOS 2.9  --   --  4.9*  --  3.2  --    < > = values in this interval not displayed.   GFR: Estimated Creatinine Clearance: 26.9 mL/min (A) (by C-G formula based on SCr of 2.52 mg/dL (H)). Liver Function Tests: Recent Labs  Lab 08/03/23 1424 08/04/23 1524 08/06/23 0811  AST 35  --   --   ALT 47*  --   --   ALKPHOS 57  --   --   BILITOT 1.3*  --   --   PROT 7.5  --   --   ALBUMIN 3.8 3.2* 3.3*   No results for input(s): "LIPASE", "AMYLASE" in the last 168 hours. No results for input(s): "AMMONIA" in the last 168 hours. Coagulation Profile: No results for input(s): "INR", "PROTIME" in the last 168 hours. Cardiac Enzymes: No results for input(s): "CKTOTAL", "CKMB", "CKMBINDEX", "TROPONINI" in the last 168 hours. BNP (last 3 results) No results for input(s): "PROBNP" in the last 8760 hours. HbA1C: No results for input(s): "HGBA1C" in the last 72 hours. CBG: Recent Labs  Lab 08/03/23 1652 08/03/23 2023  GLUCAP 142* 165*   Lipid Profile: No results for input(s): "CHOL", "HDL", "LDLCALC", "TRIG", "CHOLHDL", "LDLDIRECT" in the last 72 hours. Thyroid  Function Tests: No results for input(s): "TSH", "T4TOTAL", "FREET4", "T3FREE", "THYROIDAB" in the last 72 hours. Anemia Panel: No results for input(s): "VITAMINB12", "FOLATE", "FERRITIN", "TIBC", "IRON", "RETICCTPCT" in the last 72 hours. Sepsis  Labs: No results for input(s): "PROCALCITON", "LATICACIDVEN" in the last 168 hours.  Recent Results (from the past 240 hours)  MRSA Next Gen by PCR, Nasal     Status: None   Collection Time: 08/03/23  4:52 PM   Specimen: Nasal Mucosa; Nasal Swab  Result Value Ref Range Status   MRSA by PCR Next Gen NOT DETECTED NOT DETECTED Final    Comment: (NOTE) The GeneXpert MRSA Assay (FDA approved for NASAL specimens only), is one component of a comprehensive MRSA colonization surveillance program. It is not intended to diagnose MRSA infection nor to guide or monitor treatment for MRSA infections. Test performance is not FDA approved in patients less than 6 years old. Performed at [facility], [address] [place], [place] [address]  Radiology Studies: No results found.       Scheduled Meds:  allopurinol   100 mg Oral Daily   amLODipine   5 mg Oral BID   apixaban   2.5 mg Oral BID   Chlorhexidine  Gluconate Cloth  6 each Topical Q0600   cholecalciferol   1,000 Units Oral Daily   cloNIDine   0.1 mg Oral BID   [START ON 08/09/2023] epoetin alfa-epbx (RETACRIT) injection  4,000 Units Intravenous Q T,Th,Sa-HD   furosemide   80 mg Oral Daily   hydrALAZINE   100 mg Oral TID   isosorbide  mononitrate  60 mg Oral Daily   pantoprazole   40 mg Oral Daily   rosuvastatin   10 mg Oral QHS   Continuous Infusions:     LOS: 5 days    Raymonde Calico, MD [facility]   If 7PM-7AM, please contact night-coverage  08/08/2023, 2:09 PM

## 2023-08-08 NOTE — Care Management Important Message (Signed)
 Important Message  Patient Details  Name: Gary Mccoy. MRN: [id number] Date of Birth: [birth year]   Important Message Given:  Yes - Medicare IM     Keneth Borg W, CMA 08/08/2023, 12:15 PM

## 2023-08-09 DIAGNOSIS — J9601 Acute respiratory failure with hypoxia: Secondary | ICD-10-CM | POA: Diagnosis not present

## 2023-08-09 LAB — CBC
HCT: 30.1 % — ABNORMAL LOW (ref 39.0–52.0)
Hemoglobin: 9.9 g/dL — ABNORMAL LOW (ref 13.0–17.0)
MCH: 28.2 pg (ref 26.0–34.0)
MCHC: 32.9 g/dL (ref 30.0–36.0)
MCV: 85.8 fL (ref 80.0–100.0)
Platelets: 197 10*3/uL (ref 150–400)
RBC: 3.51 MIL/uL — ABNORMAL LOW (ref 4.22–5.81)
RDW: 15.1 % (ref 11.5–15.5)
WBC: 8.1 10*3/uL (ref 4.0–10.5)
nRBC: 0 % (ref 0.0–0.2)

## 2023-08-09 LAB — BASIC METABOLIC PANEL WITH GFR
Anion gap: 11 (ref 5–15)
BUN: 50 mg/dL — ABNORMAL HIGH (ref 8–23)
CO2: 25 mmol/L (ref 22–32)
Calcium: 8.6 mg/dL — ABNORMAL LOW (ref 8.9–10.3)
Chloride: 94 mmol/L — ABNORMAL LOW (ref 98–111)
Creatinine, Ser: 3.7 mg/dL — ABNORMAL HIGH (ref 0.61–1.24)
GFR, Estimated: 17 mL/min — ABNORMAL LOW (ref >60)
Glucose, Bld: 113 mg/dL — ABNORMAL HIGH (ref 70–99)
Potassium: 3.5 mmol/L (ref 3.5–5.1)
Sodium: 130 mmol/L — ABNORMAL LOW (ref 135–145)

## 2023-08-09 MED ORDER — HEPARIN SODIUM (PORCINE) 1000 UNIT/ML IJ SOLN
INTRAMUSCULAR | Status: AC
  Filled 2023-08-09: qty 10

## 2023-08-09 MED ORDER — EPOETIN ALFA-EPBX 4000 UNIT/ML IJ SOLN
INTRAMUSCULAR | Status: AC
  Filled 2023-08-09: qty 1

## 2023-08-09 NOTE — Progress Notes (Signed)
 [facility]  ROUNDING NOTE   Subjective:  Patient seen on dialysis, tolerating treatment. No acute complaints. Hemodialysis dialysis treatment flowsheet  Blood flow rate (mL/min):349 Arterial pressures (mmHg): -182.62 Venous pressures (mmHg):144.44 TMP (mmHg):22.22 Ultrafiltration rate (mL/min):828 Dialysate (mL/min):300   Objective:  Vital signs in last 24 hours:  Temp:  [97.6 F (36.4 C)-98.2 F (36.8 C)] 98.2 F (36.8 C) (05/24 0752) Pulse Rate:  [65-77] 70 (05/24 1100) Resp:  [12-18] 14 (05/24 0930) BP: (104-173)/(59-83) 147/81 (05/24 1100) SpO2:  [98 %-100 %] 98 % (05/24 1100) Weight:  [73.4 kg-74.2 kg] 73.4 kg (05/24 0752)  Weight change: 2 kg Filed Weights   08/07/23 1850 08/09/23 0500 08/09/23 0752  Weight: 70.7 kg 74.2 kg 73.4 kg    Intake/Output: I/O last 3 completed shifts: In: 920 [P.O.:920] Out: -    Intake/Output this shift:  No intake/output data recorded.  Physical Exam: General: NAD,   Head: Normocephalic, atraumatic. Moist oral mucosal membranes  Eyes: Anicteric, PERRL  Neck: Supple, trachea midline  Lungs:  Clear to auscultation  Heart: Regular rate and rhythm  Abdomen:  Soft, nontender,   Extremities:  No peripheral edema.  Neurologic: Nonfocal, moving all four extremities  Skin: No lesions  Access: Rt internal jugular permcath    Basic Metabolic Panel: Recent Labs  Lab 08/03/23 1424 08/03/23 1954 08/06/23 0811 08/06/23 1416 08/07/23 0552 08/08/23 0239 08/09/23 0451  NA 133*   < > 133* 132* 134* 133* 130*  K 5.3*   < > 2.7* 3.1* 3.1* 3.6 3.5  CL 98   < > 91* 95* 97* 97* 94*  CO2 22   < > 28 24 27 27 25   GLUCOSE 132*   < > 83 158* 100* 106* 113*  BUN 89*   < > 106* 55* 66* 33* 50*  CREATININE 4.16*   < > 3.60* 2.56* 3.12* 2.52* 3.70*  CALCIUM  9.1   < > 8.9 8.6* 8.7* 8.6* 8.6*  MG 2.0  --   --   --  1.9  --   --   PHOS 2.9  --  4.9*  --  3.2  --   --    < > = values in this interval not displayed.    Liver  Function Tests: Recent Labs  Lab 08/03/23 1424 08/04/23 1524 08/06/23 0811  AST 35  --   --   ALT 47*  --   --   ALKPHOS 57  --   --   BILITOT 1.3*  --   --   PROT 7.5  --   --   ALBUMIN 3.8 3.2* 3.3*   No results for input(s): "LIPASE", "AMYLASE" in the last 168 hours. No results for input(s): "AMMONIA" in the last 168 hours.  CBC: Recent Labs  Lab 08/03/23 1424 08/04/23 0305 08/06/23 0811 08/07/23 1609 08/09/23 0802  WBC 11.6* 5.8 6.7 6.8 8.1  HGB 10.5* 9.8* 9.4* 9.3* 9.9*  HCT 31.9* 29.7* 27.8* 27.5* 30.1*  MCV 88.6 87.6 84.2 85.1 85.8  PLT 155 129* 174 181 197    Cardiac Enzymes: No results for input(s): "CKTOTAL", "CKMB", "CKMBINDEX", "TROPONINI" in the last 168 hours.  BNP: Invalid input(s): "POCBNP"  CBG: Recent Labs  Lab 08/03/23 1652 08/03/23 2023  GLUCAP 142* 165*    Microbiology: Results for orders placed or performed during the hospital encounter of 08/03/23  MRSA Next Gen by PCR, Nasal     Status: None   Collection Time: 08/03/23  4:52 PM  Specimen: Nasal Mucosa; Nasal Swab  Result Value Ref Range Status   MRSA by PCR Next Gen NOT DETECTED NOT DETECTED Final    Comment: (NOTE) The GeneXpert MRSA Assay (FDA approved for NASAL specimens only), is one component of a comprehensive MRSA colonization surveillance program. It is not intended to diagnose MRSA infection nor to guide or monitor treatment for MRSA infections. Test performance is not FDA approved in patients less than 26 years old. Performed at [facility], [address] [place], [place] [address]     Coagulation Studies: No results for input(s): "LABPROT", "INR" in the last 72 hours.  Urinalysis: No results for input(s): "COLORURINE", "LABSPEC", "PHURINE", "GLUCOSEU", "HGBUR", "BILIRUBINUR", "KETONESUR", "PROTEINUR", "UROBILINOGEN", "NITRITE", "LEUKOCYTESUR" in the last 72 hours.  Invalid input(s): "APPERANCEUR"    Imaging: No results found.   Medications:      allopurinol   100 mg Oral Daily   amLODipine   5 mg Oral BID   apixaban   2.5 mg Oral BID   Chlorhexidine  Gluconate Cloth  6 each Topical Q0600   cholecalciferol   1,000 Units Oral Daily   cloNIDine   0.1 mg Oral BID   epoetin alfa-epbx (RETACRIT) injection  4,000 Units Intravenous Q T,Th,Sa-HD   furosemide   80 mg Oral Daily   isosorbide  mononitrate  60 mg Oral Daily   pantoprazole   40 mg Oral Daily   rosuvastatin   10 mg Oral QHS   nitroGLYCERIN , senna-docusate  Assessment/ Plan:  Gary Mccoy. is a 73 y.o.  male with PMH of HFrEF,CAD, PVD, CVA, COPD, stage IV CKD who is admitted to [facility] on 06/21/2022 for Hyperkalemia [E87.5] AKI (acute kidney injury) (HCC) [N17.9] Acute on chronic congestive heart failure, unspecified heart failure type (HCC) [I50.9] Acute hypoxic respiratory failure (HCC) [J96.01]   Acute Kidney Injury on chronic kidney disease stage IV 2/2 to acute cardiorenal syndrome: baseline creatinine EGFR of 18.. 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 Renal ultrasound shows atrophic right kidney.  Appreciate vascular placing rt internal jugular permcath on 5/20. Awaiting outpatient dialysis clinic search, [facility] and [address] 1.5-2L UF goal today with dialysis.   Lab Results  Component Value Date   CREATININE 3.70 (H) 08/09/2023   CREATININE 2.52 (H) 08/08/2023   CREATININE 3.12 (H) 08/07/2023       Intake/Output Summary (Last 24 hours) at 08/09/2023 1129 Last data filed at 08/08/2023 2049 Gross per 24 hour  Intake 440 ml  Output --  Net 440 ml        Acute on chronic systolic and diastolic heart failure. Continue oral furosemide  80mg  daily.    3. Anemia with chronic kidney disease: Hemoglobin 9.9.  Continue low dose epo with dialysis.        LOS: 6 Gary Mccoy 5/24/202511:28 AM

## 2023-08-09 NOTE — Plan of Care (Signed)

## 2023-08-09 NOTE — Progress Notes (Signed)
 Mobility Specialist - Progress Note   08/09/23 1435  Mobility  Activity Stood at bedside  Level of Assistance Independent  Assistive Device None  Distance Ambulated (ft) 0 ft  Activity Response Tolerated well  Mobility Referral Yes  Mobility visit 1 Mobility   Pt sitting in recliner on RA upon arrival. Pt STS and endorses dizziness/lightheaded upon standing and defers further mobility. RN notified. Pt left in recliner with needs in reach.   Wash Hack  Mobility Specialist  08/09/23 2:37 PM

## 2023-08-09 NOTE — Plan of Care (Addendum)
 Neuro: pt A&O x 4, appropriate, clear speech Cardiac: NSR 60-70s Resp: room air GI/GU: reg diet, fluid restriction continued  Pt went to dialysis in am, 1.9 liters removed. Pt returned after dialysis and seemed irritable and stated that he is just tired of being in the hospital. Pt reports he thinks outpatient dialysis will be on [address]. Pt did state that he thinks they took too much at dialysis today and felt worse after session. Family at bedside during part of shift.     Problem: Education: Goal: Knowledge of General Education information will improve Description: Including pain rating scale, medication(s)/side effects and non-pharmacologic comfort measures Outcome: Progressing   Problem: Health Behavior/Discharge Planning: Goal: Ability to manage health-related needs will improve Outcome: Progressing   Problem: Clinical Measurements: Goal: Ability to maintain clinical measurements within normal limits will improve Outcome: Progressing Goal: Will remain free from infection Outcome: Progressing Goal: Diagnostic test results will improve Outcome: Progressing Goal: Respiratory complications will improve Outcome: Progressing Goal: Cardiovascular complication will be avoided Outcome: Progressing   Problem: Activity: Goal: Risk for activity intolerance will decrease Outcome: Progressing   Problem: Nutrition: Goal: Adequate nutrition will be maintained Outcome: Progressing   Problem: Coping: Goal: Level of anxiety will decrease Outcome: Progressing   Problem: Elimination: Goal: Will not experience complications related to bowel motility Outcome: Progressing Goal: Will not experience complications related to urinary retention Outcome: Progressing   Problem: Pain Managment: Goal: General experience of comfort will improve and/or be controlled Outcome: Progressing   Problem: Safety: Goal: Ability to remain free from injury will improve Outcome: Progressing    Problem: Skin Integrity: Goal: Risk for impaired skin integrity will decrease Outcome: Progressing

## 2023-08-09 NOTE — Progress Notes (Addendum)
 Hemodialysis Note:  Received patient in bed to unit. Alert and oriented. Informed consent singed and in chart.  Treatment initiated: 0805 Treatment completed: 1144  Access used: Right internal jugular catheter Access issues: None  Patient tolerated well. Transported back to room, alert without acute distress. Report given to patient's RN.  200 ml saline bolus given due to leg cramp  Total UF removed: 1.9 liters Medications given: Retacrit 4000 units IV  Post HD weight: 71.5 Kg  [facility]

## 2023-08-09 NOTE — Progress Notes (Signed)
 PROGRESS NOTE    Gary Mccoy.  VHQ:[id number] DOB: Dec 27, 1950 DOA: 08/03/2023 PCP: Sari Cunning, MD    Brief Narrative:  73 year old male with history of combined heart failure, COPD, CKD stage IV, hypertension, hyperlipidemia, history of CVA, dilated cardiomyopathy, history of CAD status post PCI with DES in 2023, PAD, on oral anticoagulation, who presents emergency department for chief concerns of shortness of breath.   Furosemide  80 mg IV one-time dose, DuoNebs one-time treatment.  EDP discussed with nephrology, who is aware of patient and recommends furosemide  gtt. as well.   Per ED provider, patient has been admitted for similar in the past and had apparent plans for CRRT.  Patient does not have hemodialysis access at this time.  Assessment & Plan:   Principal Problem:   Acute hypoxic respiratory failure (HCC) Active Problems:   Acute on chronic respiratory failure with hypoxia (HCC)   Acute renal failure superimposed on stage 4 chronic kidney disease (HCC)   PAD s/p aortobifemoral bypass 2017 (peripheral artery disease)   Benign essential HTN   Leukocytosis   Hyperlipidemia   CVA (cerebral vascular accident) (HCC)   Acute exacerbation of CHF (congestive heart failure) (HCC)   CKD (chronic kidney disease) stage 4, GFR 15-29 ml/min (HCC)   CAD S/P percutaneous coronary angioplasty   Encounter for dialysis and dialysis catheter care [facility])  Acute hypoxic respiratory failure (HCC) Suspect secondary to heart failure exacerbation BPAP weaned off Now weaned off oxygen  HFpEF Volume mgmt primarily with dialysis now, oral lasix , too. Appears euvolemic     Acute renal failure superimposed on stage 4 chronic kidney disease (HCC) Progressed to ESRD Plan: PermCath placement with vascular surgery 5/20. Tolerated dialysis 5/21 and 5/22, and today, though some nausea after  Hyperlipidemia Continue statin  Benign essential HTN Well controlled,  Continue home  Imdur  Holding home hydralazine  Continue home amlodipine  Continue home clonidine  As needed IV hydralazine   Hypokalemia Replete with dialysis  Debility PT advising outpt PT  DVT prophylaxis: Eliquis  Code Status: FULL Family Communication wife updated @ bedside 5/24, shared decision for intermittent updates provided patient remains stable Disposition Plan: Status is: Inpatient Remains inpatient appropriate because: pending outpatient dialysis arrangements   Level of care: Telemetry Medical  Consultants:  Nephrology Vascular surgery  Procedures:  Dialysis catheter placement  Antimicrobials: None   Subjective: Seen and examined resting in chair.  Nausea after dialysis, improving. Frustrated.  Objective: Vitals:   08/09/23 1211 08/09/23 1220 08/09/23 1223 08/09/23 1517  BP: (!) 178/86 (!) 178/86 (!) 178/86 111/66  Pulse:    81  Resp:    16  Temp:    97.9 F (36.6 C)  TempSrc:    Oral  SpO2:    100%  Weight:      Height:        Intake/Output Summary (Last 24 hours) at 08/09/2023 1630 Last data filed at 08/09/2023 1144 Gross per 24 hour  Intake 440 ml  Output 1900 ml  Net -1460 ml   Filed Weights   08/09/23 0500 08/09/23 0752 08/09/23 1144  Weight: 74.2 kg 73.4 kg 71.5 kg    Examination:  General exam: NAD Respiratory system: Lungs clear.  Normal work of breathing.    Cardiovascular system: S1-S2, RRR  Gastrointestinal system: Soft, NT/ND   Central nervous system: Alert and oriented. No focal neurological deficits. Extremities: Symmetric 5 x 5 power. No edema Skin: No rashes, lesions or ulcers Psychiatry: a bit agitated    Data Reviewed: I have  personally reviewed following labs and imaging studies  CBC: Recent Labs  Lab 08/03/23 1424 08/04/23 0305 08/06/23 0811 08/07/23 1609 08/09/23 0802  WBC 11.6* 5.8 6.7 6.8 8.1  HGB 10.5* 9.8* 9.4* 9.3* 9.9*  HCT 31.9* 29.7* 27.8* 27.5* 30.1*  MCV 88.6 87.6 84.2 85.1 85.8  PLT 155 129* 174 181 197    Basic Metabolic Panel: Recent Labs  Lab 08/03/23 1424 08/03/23 1954 08/06/23 0811 08/06/23 1416 08/07/23 0552 08/08/23 0239 08/09/23 0451  NA 133*   < > 133* 132* 134* 133* 130*  K 5.3*   < > 2.7* 3.1* 3.1* 3.6 3.5  CL 98   < > 91* 95* 97* 97* 94*  CO2 22   < > 28 24 27 27 25   GLUCOSE 132*   < > 83 158* 100* 106* 113*  BUN 89*   < > 106* 55* 66* 33* 50*  CREATININE 4.16*   < > 3.60* 2.56* 3.12* 2.52* 3.70*  CALCIUM  9.1   < > 8.9 8.6* 8.7* 8.6* 8.6*  MG 2.0  --   --   --  1.9  --   --   PHOS 2.9  --  4.9*  --  3.2  --   --    < > = values in this interval not displayed.   GFR: Estimated Creatinine Clearance: 18.5 mL/min (A) (by C-G formula based on SCr of 3.7 mg/dL (H)). Liver Function Tests: Recent Labs  Lab 08/03/23 1424 08/04/23 1524 08/06/23 0811  AST 35  --   --   ALT 47*  --   --   ALKPHOS 57  --   --   BILITOT 1.3*  --   --   PROT 7.5  --   --   ALBUMIN 3.8 3.2* 3.3*   No results for input(s): "LIPASE", "AMYLASE" in the last 168 hours. No results for input(s): "AMMONIA" in the last 168 hours. Coagulation Profile: No results for input(s): "INR", "PROTIME" in the last 168 hours. Cardiac Enzymes: No results for input(s): "CKTOTAL", "CKMB", "CKMBINDEX", "TROPONINI" in the last 168 hours. BNP (last 3 results) No results for input(s): "PROBNP" in the last 8760 hours. HbA1C: No results for input(s): "HGBA1C" in the last 72 hours. CBG: Recent Labs  Lab 08/03/23 1652 08/03/23 2023  GLUCAP 142* 165*   Lipid Profile: No results for input(s): "CHOL", "HDL", "LDLCALC", "TRIG", "CHOLHDL", "LDLDIRECT" in the last 72 hours. Thyroid  Function Tests: No results for input(s): "TSH", "T4TOTAL", "FREET4", "T3FREE", "THYROIDAB" in the last 72 hours. Anemia Panel: No results for input(s): "VITAMINB12", "FOLATE", "FERRITIN", "TIBC", "IRON", "RETICCTPCT" in the last 72 hours. Sepsis Labs: No results for input(s): "PROCALCITON", "LATICACIDVEN" in the last 168  hours.  Recent Results (from the past 240 hours)  MRSA Next Gen by PCR, Nasal     Status: None   Collection Time: 08/03/23  4:52 PM   Specimen: Nasal Mucosa; Nasal Swab  Result Value Ref Range Status   MRSA by PCR Next Gen NOT DETECTED NOT DETECTED Final    Comment: (NOTE) The GeneXpert MRSA Assay (FDA approved for NASAL specimens only), is one component of a comprehensive MRSA colonization surveillance program. It is not intended to diagnose MRSA infection nor to guide or monitor treatment for MRSA infections. Test performance is not FDA approved in patients less than 8 years old. Performed at [facility], [address], [place], [place] [address]          Radiology Studies: No results found.  Scheduled Meds:  allopurinol   100 mg Oral Daily   amLODipine   5 mg Oral BID   apixaban   2.5 mg Oral BID   Chlorhexidine  Gluconate Cloth  6 each Topical Q0600   cholecalciferol   1,000 Units Oral Daily   cloNIDine   0.1 mg Oral BID   epoetin alfa-epbx (RETACRIT) injection  4,000 Units Intravenous Q T,Th,Sa-HD   furosemide   80 mg Oral Daily   isosorbide  mononitrate  60 mg Oral Daily   pantoprazole   40 mg Oral Daily   rosuvastatin   10 mg Oral QHS   Continuous Infusions:     LOS: 6 days    Raymonde Calico, MD [facility]   If 7PM-7AM, please contact night-coverage  08/09/2023, 4:30 PM

## 2023-08-10 DIAGNOSIS — J9601 Acute respiratory failure with hypoxia: Secondary | ICD-10-CM | POA: Diagnosis not present

## 2023-08-10 LAB — BASIC METABOLIC PANEL WITH GFR
Anion gap: 10 (ref 5–15)
BUN: 30 mg/dL — ABNORMAL HIGH (ref 8–23)
CO2: 30 mmol/L (ref 22–32)
Calcium: 9 mg/dL (ref 8.9–10.3)
Chloride: 90 mmol/L — ABNORMAL LOW (ref 98–111)
Creatinine, Ser: 3.58 mg/dL — ABNORMAL HIGH (ref 0.61–1.24)
GFR, Estimated: 17 mL/min — ABNORMAL LOW (ref >60)
Glucose, Bld: 120 mg/dL — ABNORMAL HIGH (ref 70–99)
Potassium: 3.3 mmol/L — ABNORMAL LOW (ref 3.5–5.1)
Sodium: 130 mmol/L — ABNORMAL LOW (ref 135–145)

## 2023-08-10 MED ORDER — SPIRONOLACTONE 25 MG PO TABS
25.0000 mg | ORAL_TABLET | Freq: Every day | ORAL | Status: DC
  Administered 2023-08-10 – 2023-08-13 (×4): 25 mg via ORAL
  Filled 2023-08-10 (×4): qty 1

## 2023-08-10 NOTE — Progress Notes (Signed)
 Mobility Specialist - Progress Note   08/10/23 1031  Mobility  Activity Ambulated with assistance in hallway  Level of Assistance Standby assist, set-up cues, supervision of patient - no hands on  Assistive Device None  Distance Ambulated (ft) 180 ft  Activity Response Tolerated well  Mobility Referral Yes  Mobility visit 1 Mobility  Mobility Specialist Start Time (ACUTE ONLY) 1005  Mobility Specialist Stop Time (ACUTE ONLY) 1018  Mobility Specialist Time Calculation (min) (ACUTE ONLY) 13 min   Pt sitting in recliner on RA upon arrival. Pt STS X2 and ambulates in hallway SBA with no LOB noted. Pt returns to recliner with family present and needs in reach.   Wash Hack  Mobility Specialist  08/10/23 10:32 AM

## 2023-08-10 NOTE — Progress Notes (Signed)
 PROGRESS NOTE    Gary Mccoy.  RUE:[id number] DOB: [birth year] DOA: 08/03/2023 PCP: Sari Cunning, MD    Brief Narrative:  73 year old male with history of combined heart failure, COPD, CKD stage IV, hypertension, hyperlipidemia, history of CVA, dilated cardiomyopathy, history of CAD status post PCI with DES in 2023, PAD, on oral anticoagulation, who presents emergency department for chief concerns of shortness of breath.   Furosemide  80 mg IV one-time dose, DuoNebs one-time treatment.  EDP discussed with nephrology, who is aware of patient and recommends furosemide  gtt. as well.   Per ED provider, patient has been admitted for similar in the past and had apparent plans for CRRT.  Patient does not have hemodialysis access at this time.  Assessment & Plan:   Principal Problem:   Acute hypoxic respiratory failure (HCC) Active Problems:   Acute on chronic respiratory failure with hypoxia (HCC)   Acute renal failure superimposed on stage 4 chronic kidney disease (HCC)   PAD s/p aortobifemoral bypass 2017 (peripheral artery disease)   Benign essential HTN   Leukocytosis   Hyperlipidemia   CVA (cerebral vascular accident) (HCC)   Acute exacerbation of CHF (congestive heart failure) (HCC)   CKD (chronic kidney disease) stage 4, GFR 15-29 ml/min (HCC)   CAD S/P percutaneous coronary angioplasty   Encounter for dialysis and dialysis catheter care [facility])  Acute hypoxic respiratory failure (HCC) Suspect secondary to heart failure exacerbation BPAP weaned off Now weaned off oxygen   HFpEF Volume mgmt primarily with dialysis now, oral lasix  and spiro. Appears euvolemic     Acute renal failure superimposed on stage 4 chronic kidney disease (HCC) Progressed to ESRD Plan: PermCath placement with vascular surgery 5/20. Receiving tts hemodialysis  Hyperlipidemia Continue statin  Benign essential HTN Well controlled,  Continue home Imdur , amlodipine , clonidine . Spiro added  today by nephrology Home hydral on hold, if bps remain well controlled can see about weaning off clonidine   Hypokalemia Replete with dialysis  Debility PT advising outpt PT  DVT prophylaxis: Eliquis  Code Status: FULL Family Communication wife updated @ bedside 5/24, shared decision for intermittent updates provided patient remains stable Disposition Plan: Status is: Inpatient Remains inpatient appropriate because: pending outpatient dialysis arrangements   Level of care: Telemetry Medical  Consultants:  Nephrology Vascular surgery  Procedures:  Dialysis catheter placement  Antimicrobials: None   Subjective: Seen and examined resting in chair.  Cramping overnight resolved, enjoyed a shower just now  Objective: Vitals:   08/10/23 0546 08/10/23 0548 08/10/23 0923 08/10/23 1217  BP: 116/66 116/66 (!) 140/64 109/71  Pulse: 63 63 64 69  Resp:  18 16 16   Temp:  97.7 F (36.5 C) 97.6 F (36.4 C) 97.7 F (36.5 C)  TempSrc:   Oral Oral  SpO2: 100% 100% 100% 96%  Weight:      Height:        Intake/Output Summary (Last 24 hours) at 08/10/2023 1255 Last data filed at 08/10/2023 1055 Gross per 24 hour  Intake 720 ml  Output --  Net 720 ml   Filed Weights   08/09/23 0500 08/09/23 0752 08/09/23 1144  Weight: 74.2 kg 73.4 kg 71.5 kg    Examination:  General exam: NAD Respiratory system: Lungs clear.  Normal work of breathing.    Cardiovascular system: S1-S2, RRR  Gastrointestinal system: Soft, NT/ND   Central nervous system: Alert and oriented. No focal neurological deficits. Extremities: Symmetric 5 x 5 power. No edema Skin: No rashes, lesions or ulcers Psychiatry: calm  today    Data Reviewed: I have personally reviewed following labs and imaging studies  CBC: Recent Labs  Lab 08/03/23 1424 08/04/23 0305 08/06/23 0811 08/07/23 1609 08/09/23 0802  WBC 11.6* 5.8 6.7 6.8 8.1  HGB 10.5* 9.8* 9.4* 9.3* 9.9*  HCT 31.9* 29.7* 27.8* 27.5* 30.1*  MCV 88.6  87.6 84.2 85.1 85.8  PLT 155 129* 174 181 197   Basic Metabolic Panel: Recent Labs  Lab 08/03/23 1424 08/03/23 1954 08/06/23 0811 08/06/23 1416 08/07/23 0552 08/08/23 0239 08/09/23 0451 08/10/23 0545  NA 133*   < > 133* 132* 134* 133* 130* 130*  K 5.3*   < > 2.7* 3.1* 3.1* 3.6 3.5 3.3*  CL 98   < > 91* 95* 97* 97* 94* 90*  CO2 22   < > 28 24 27 27 25 30   GLUCOSE 132*   < > 83 158* 100* 106* 113* 120*  BUN 89*   < > 106* 55* 66* 33* 50* 30*  CREATININE 4.16*   < > 3.60* 2.56* 3.12* 2.52* 3.70* 3.58*  CALCIUM  9.1   < > 8.9 8.6* 8.7* 8.6* 8.6* 9.0  MG 2.0  --   --   --  1.9  --   --   --   PHOS 2.9  --  4.9*  --  3.2  --   --   --    < > = values in this interval not displayed.   GFR: Estimated Creatinine Clearance: 19.1 mL/min (A) (by C-G formula based on SCr of 3.58 mg/dL (H)). Liver Function Tests: Recent Labs  Lab 08/03/23 1424 08/04/23 1524 08/06/23 0811  AST 35  --   --   ALT 47*  --   --   ALKPHOS 57  --   --   BILITOT 1.3*  --   --   PROT 7.5  --   --   ALBUMIN 3.8 3.2* 3.3*   No results for input(s): "LIPASE", "AMYLASE" in the last 168 hours. No results for input(s): "AMMONIA" in the last 168 hours. Coagulation Profile: No results for input(s): "INR", "PROTIME" in the last 168 hours. Cardiac Enzymes: No results for input(s): "CKTOTAL", "CKMB", "CKMBINDEX", "TROPONINI" in the last 168 hours. BNP (last 3 results) No results for input(s): "PROBNP" in the last 8760 hours. HbA1C: No results for input(s): "HGBA1C" in the last 72 hours. CBG: Recent Labs  Lab 08/03/23 1652 08/03/23 2023  GLUCAP 142* 165*   Lipid Profile: No results for input(s): "CHOL", "HDL", "LDLCALC", "TRIG", "CHOLHDL", "LDLDIRECT" in the last 72 hours. Thyroid  Function Tests: No results for input(s): "TSH", "T4TOTAL", "FREET4", "T3FREE", "THYROIDAB" in the last 72 hours. Anemia Panel: No results for input(s): "VITAMINB12", "FOLATE", "FERRITIN", "TIBC", "IRON", "RETICCTPCT" in the last  72 hours. Sepsis Labs: No results for input(s): "PROCALCITON", "LATICACIDVEN" in the last 168 hours.  Recent Results (from the past 240 hours)  MRSA Next Gen by PCR, Nasal     Status: None   Collection Time: 08/03/23  4:52 PM   Specimen: Nasal Mucosa; Nasal Swab  Result Value Ref Range Status   MRSA by PCR Next Gen NOT DETECTED NOT DETECTED Final    Comment: (NOTE) The GeneXpert MRSA Assay (FDA approved for NASAL specimens only), is one component of a comprehensive MRSA colonization surveillance program. It is not intended to diagnose MRSA infection nor to guide or monitor treatment for MRSA infections. Test performance is not FDA approved in patients less than 17 years old. Performed at [facility], [address], [place], [place] [address]          Radiology Studies: No results found.       Scheduled Meds:  allopurinol   100 mg Oral Daily   amLODipine   5 mg Oral BID   apixaban   2.5 mg Oral BID   Chlorhexidine  Gluconate Cloth  6 each Topical Q0600   cholecalciferol   1,000 Units Oral Daily   cloNIDine   0.1 mg Oral BID   epoetin alfa-epbx (RETACRIT) injection  4,000 Units Intravenous Q T,Th,Sa-HD   furosemide   80 mg Oral Daily   isosorbide  mononitrate  60 mg Oral Daily   pantoprazole   40 mg Oral Daily   rosuvastatin   10 mg Oral QHS   spironolactone  25 mg Oral Daily   Continuous Infusions:     LOS: 7 days    Raymonde Calico, MD [facility]   If 7PM-7AM, please contact night-coverage  08/10/2023, 12:55 PM

## 2023-08-10 NOTE — Plan of Care (Signed)

## 2023-08-10 NOTE — Progress Notes (Signed)
 [facility]  ROUNDING NOTE   Subjective:  Patient seen sitting up in chair, on room air, wife at bedside. Patient reports severe prolonged cramping overnight and concern for drop in weight. Discussed dialysis and medication adjustments to reduce risks of cramping in length with patient. No other complaints to offer.    Objective:  Vital signs in last 24 hours:  Temp:  [97.6 F (36.4 C)-98.3 F (36.8 C)] 97.6 F (36.4 C) (05/25 0923) Pulse Rate:  [63-81] 64 (05/25 0923) Resp:  [14-18] 16 (05/25 0923) BP: (92-178)/(55-108) 140/64 (05/25 0923) SpO2:  [100 %] 100 % (05/25 0923) Weight:  [71.5 kg] 71.5 kg (05/24 1144)  Weight change: -0.8 kg Filed Weights   08/09/23 0500 08/09/23 0752 08/09/23 1144  Weight: 74.2 kg 73.4 kg 71.5 kg    Intake/Output: I/O last 3 completed shifts: In: 920 [P.O.:920] Out: 1900 [Other:1900]   Intake/Output this shift:  Total I/O In: 240 [P.O.:240] Out: -   Physical Exam: General: NAD,   Head: Normocephalic, atraumatic. Moist oral mucosal membranes  Eyes: Anicteric, PERRL  Neck: Supple, trachea midline  Lungs:  Clear to auscultation  Heart: Regular rate and rhythm  Abdomen:  Soft, nontender, distended  Extremities:  No peripheral edema.  Neurologic: Nonfocal, moving all four extremities  Skin: No lesions  Access: Rt chest Permcath    Basic Metabolic Panel: Recent Labs  Lab 08/03/23 1424 08/03/23 1954 08/06/23 0811 08/06/23 1416 08/07/23 0552 08/08/23 0239 08/09/23 0451 08/10/23 0545  NA 133*   < > 133* 132* 134* 133* 130* 130*  K 5.3*   < > 2.7* 3.1* 3.1* 3.6 3.5 3.3*  CL 98   < > 91* 95* 97* 97* 94* 90*  CO2 22   < > 28 24 27 27 25 30   GLUCOSE 132*   < > 83 158* 100* 106* 113* 120*  BUN 89*   < > 106* 55* 66* 33* 50* 30*  CREATININE 4.16*   < > 3.60* 2.56* 3.12* 2.52* 3.70* 3.58*  CALCIUM  9.1   < > 8.9 8.6* 8.7* 8.6* 8.6* 9.0  MG 2.0  --   --   --  1.9  --   --   --   PHOS 2.9  --  4.9*  --  3.2  --   --   --     < > = values in this interval not displayed.    Liver Function Tests: Recent Labs  Lab 08/03/23 1424 08/04/23 1524 08/06/23 0811  AST 35  --   --   ALT 47*  --   --   ALKPHOS 57  --   --   BILITOT 1.3*  --   --   PROT 7.5  --   --   ALBUMIN 3.8 3.2* 3.3*   No results for input(s): "LIPASE", "AMYLASE" in the last 168 hours. No results for input(s): "AMMONIA" in the last 168 hours.  CBC: Recent Labs  Lab 08/03/23 1424 08/04/23 0305 08/06/23 0811 08/07/23 1609 08/09/23 0802  WBC 11.6* 5.8 6.7 6.8 8.1  HGB 10.5* 9.8* 9.4* 9.3* 9.9*  HCT 31.9* 29.7* 27.8* 27.5* 30.1*  MCV 88.6 87.6 84.2 85.1 85.8  PLT 155 129* 174 181 197    Cardiac Enzymes: No results for input(s): "CKTOTAL", "CKMB", "CKMBINDEX", "TROPONINI" in the last 168 hours.  BNP: Invalid input(s): "POCBNP"  CBG: Recent Labs  Lab 08/03/23 1652 08/03/23 2023  GLUCAP 142* 165*    Microbiology: Results for orders placed or performed  during the hospital encounter of 08/03/23  MRSA Next Gen by PCR, Nasal     Status: None   Collection Time: 08/03/23  4:52 PM   Specimen: Nasal Mucosa; Nasal Swab  Result Value Ref Range Status   MRSA by PCR Next Gen NOT DETECTED NOT DETECTED Final    Comment: (NOTE) The GeneXpert MRSA Assay (FDA approved for NASAL specimens only), is one component of a comprehensive MRSA colonization surveillance program. It is not intended to diagnose MRSA infection nor to guide or monitor treatment for MRSA infections. Test performance is not FDA approved in patients less than 41 years old. Performed at [facility], [address] [place], [place] [address]     Coagulation Studies: No results for input(s): "LABPROT", "INR" in the last 72 hours.  Urinalysis: No results for input(s): "COLORURINE", "LABSPEC", "PHURINE", "GLUCOSEU", "HGBUR", "BILIRUBINUR", "KETONESUR", "PROTEINUR", "UROBILINOGEN", "NITRITE", "LEUKOCYTESUR" in the last 72 hours.  Invalid input(s):  "APPERANCEUR"    Imaging: No results found.   Medications:     allopurinol   100 mg Oral Daily   amLODipine   5 mg Oral BID   apixaban   2.5 mg Oral BID   Chlorhexidine  Gluconate Cloth  6 each Topical Q0600   cholecalciferol   1,000 Units Oral Daily   cloNIDine   0.1 mg Oral BID   epoetin alfa-epbx (RETACRIT) injection  4,000 Units Intravenous Q T,Th,Sa-HD   furosemide   80 mg Oral Daily   isosorbide  mononitrate  60 mg Oral Daily   pantoprazole   40 mg Oral Daily   rosuvastatin   10 mg Oral QHS   spironolactone  25 mg Oral Daily   nitroGLYCERIN , senna-docusate  Assessment/ Plan:  [name] is a 73 y.o.  male with PMH of HFrEF,CAD, PVD, CVA, COPD, stage IV CKD who is admitted to [facility] on 06/21/2022 for Hyperkalemia [E87.5] AKI (acute kidney injury) (HCC) [N17.9] Acute on chronic congestive heart failure, unspecified heart failure type (HCC) [I50.9] Acute hypoxic respiratory failure (HCC) [J96.01]   Acute Kidney Injury on chronic kidney disease stage IV 2/2 to acute cardiorenal syndrome: baseline creatinine EGFR of 18. 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 Renal ultrasound shows atrophic right kidney.  Appreciate vascular placing rt internal jugular permcath on 5/20. Awaiting outpatient dialysis clinic search, [facility] and [address] Patient received dialysis yesterday 1.9L UF and Retacrit given. Given patient  complaints of cramping, will reduce UF goals and adjust EDW. Continue daily standing weights and strict intake/output. Next dialysis planned for Tuesday.  Lab Results  Component Value Date   CREATININE 3.58 (H) 08/10/2023   CREATININE 3.70 (H) 08/09/2023   CREATININE 2.52 (H) 08/08/2023     Intake/Output Summary (Last 24 hours) at 08/10/2023 1103 Last data filed at 08/10/2023 1055 Gross per 24 hour  Intake 720 ml  Output 1900 ml  Net -1180 ml        Acute on chronic systolic and diastolic heart failure. Continue oral  furosemide  80mg  daily. Will start spironolactone 25mg  daily.   3. Anemia with chronic kidney disease: Hemoglobin 9.9.  Continue low dose epo with dialysis.   4. Hypokalemia 08/10/2023 Potassium 3.3 Encouraged PO intake, will correct with dialysis   LOS: 7 Maryah Marinaro P Amed Datta 5/25/202511:03 AM

## 2023-08-11 DIAGNOSIS — J9601 Acute respiratory failure with hypoxia: Secondary | ICD-10-CM | POA: Diagnosis not present

## 2023-08-11 LAB — BASIC METABOLIC PANEL WITH GFR
Anion gap: 16 — ABNORMAL HIGH (ref 5–15)
BUN: 47 mg/dL — ABNORMAL HIGH (ref 8–23)
CO2: 22 mmol/L (ref 22–32)
Calcium: 9 mg/dL (ref 8.9–10.3)
Chloride: 88 mmol/L — ABNORMAL LOW (ref 98–111)
Creatinine, Ser: 4.89 mg/dL — ABNORMAL HIGH (ref 0.61–1.24)
GFR, Estimated: 12 mL/min — ABNORMAL LOW (ref >60)
Glucose, Bld: 86 mg/dL (ref 70–99)
Potassium: 3.6 mmol/L (ref 3.5–5.1)
Sodium: 126 mmol/L — ABNORMAL LOW (ref 135–145)

## 2023-08-11 NOTE — Plan of Care (Signed)

## 2023-08-11 NOTE — TOC Initial Note (Signed)
 Transition of Care (TOC) - Initial/Assessment Note    Patient Details  Name: Gary Mccoy. MRN: [id number] Date of Birth: 06/01/1950  Transition of Care [facility]) CM/SW Contact:    Odilia Bennett, LCSW Phone Number: 08/11/2023, 12:58 PM  Clinical Narrative:   This CSW completed readmission prevention screen on 4/28:  "Readmission prevention screen complete. CSW met with patient. No family at bedside. CSW introduced role and explained that discharge planning would be discussed. PCP is Firman Hughes, MD. Patient drives himself to appointments. [facility] [address] Otherwise he uses the [facility]. No issues obtaining medications. Patient lives home with his wife. No home health prior to admission. PT evaluated and determined no follow up needs. No DME use prior to admission. He does have oxygen that he discharged with a year ago through [facility]. He reports only using it about a week and then again two days prior to admission. No further concerns. CSW will continue to follow patient for support and facilitate return home once stable. Wife will transport him home at discharge."  CSW met with patient to discuss PT recommendation for outpatient therapy. Patient will think about it. He is hoping to be back to baseline prior to discharge. CSW will continue to follow patient's progress and follow up on decision. Wife will transport him home at discharge.            Expected Discharge Plan: OP Rehab Barriers to Discharge: Continued Medical Work up   Patient Goals and CMS Choice            Expected Discharge Plan and Services       Living arrangements for the past 2 months: Single Family Home                                      Prior Living Arrangements/Services Living arrangements for the past 2 months: Single Family Home Lives with:: Spouse Patient language and need for interpreter reviewed:: Yes Do you feel safe  going back to the place where you live?: Yes      Need for Family Participation in Patient Care: Yes (Comment) Care giver support system in place?: Yes (comment)   Criminal Activity/Legal Involvement Pertinent to Current Situation/Hospitalization: No - Comment as needed  Activities of Daily Living   ADL Screening (condition at time of admission) Independently performs ADLs?: Yes (appropriate for developmental age) Is the patient deaf or have difficulty hearing?: No Does the patient have difficulty seeing, even when wearing glasses/contacts?: No Does the patient have difficulty concentrating, remembering, or making decisions?: No  Permission Sought/Granted                  Emotional Assessment Appearance:: Appears stated age Attitude/Demeanor/Rapport: Engaged, Gracious Affect (typically observed): Accepting, Appropriate, Calm, Pleasant Orientation: : Oriented to Self, Oriented to Place, Oriented to  Time, Oriented to Situation Alcohol / Substance Use: Not Applicable Psych Involvement: No (comment)  Admission diagnosis:  Hyperkalemia [E87.5] AKI (acute kidney injury) (HCC) [N17.9] Acute on chronic congestive heart failure, unspecified heart failure type (HCC) [I50.9] Acute hypoxic respiratory failure (HCC) [J96.01] Patient Active Problem List   Diagnosis Date Noted   Encounter for dialysis and dialysis catheter care (HCC) 08/07/2023   Acute hypoxic respiratory failure (HCC) 08/03/2023   Cardiorenal syndrome 07/12/2023   COPD with acute exacerbation (HCC) 07/11/2023   Acute CHF (congestive heart  failure) (HCC) 06/18/2022   History of CVA and bilateral carotid artery stenosis (cerebrovascular accident) 06/17/2022   Chronic anticoagulation 06/17/2022   CAD S/P percutaneous coronary angioplasty 06/17/2022   Ischemic cardiomyopathy 06/17/2022   Unstable angina (HCC) 06/17/2022   Hypertensive emergency 06/17/2022   Acute exacerbation of CHF (congestive heart failure) (HCC)  10/29/2021   CKD (chronic kidney disease) stage 4, GFR 15-29 ml/min (HCC) 10/29/2021   Acute renal failure superimposed on stage 4 chronic kidney disease (HCC) 10/29/2021   Atherosclerosis of native arteries of extremity with intermittent claudication (HCC) 04/16/2021   Acute on chronic diastolic CHF (congestive heart failure) (HCC) 01/31/2021   Melena 01/30/2021   Anemia 01/30/2021   Shortness of breath 01/30/2021   Acute on chronic respiratory failure with hypoxia (HCC) 01/29/2021   CVA (cerebral vascular accident) (HCC) 02/26/2018   Tubular adenoma 10/29/2016   Bilateral carotid artery stenosis 04/10/2016   Hyperlipidemia 01/01/2016   B12 deficiency 12/29/2015   Weakness of right lower extremity 09/19/2015   Tobacco abuse counseling 09/19/2015   PAD s/p aortobifemoral bypass 2017 (peripheral artery disease) 09/19/2015   Leukocytosis 09/19/2015   Cerebral infarction (HCC) 09/19/2015   Abnormal findings-gastrointestinal tract    Gastritis    Duodenal ulcer disease    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 08/29/2015   Contact with and (suspected ) exposure to other hazardous substances 01/27/2014   Exposure to hazardous material 01/27/2014   Degeneration of intervertebral disc of lumbar region 11/12/2013   Degeneration of intervertebral disc of lumbosacral region 11/12/2013   Neuritis or radiculitis due to rupture of lumbar intervertebral disc 11/12/2013   Thoracic neuritis 11/12/2013   Benign essential HTN 02/01/2013   PCP:  Sari Cunning, MD [facility]   [phone] [place], [facility] - [address] [place] [address] Phone: [phone] Fax: [phone]  [facility] - [place], [place] - [address] [place] [address] Phone: [phone] Fax: [phone]     Social Drivers of Health (SDOH) Social History: SDOH Screenings   Food Insecurity: No Food Insecurity (08/03/2023)  Housing: Low Risk   (08/03/2023)  Transportation Needs: No Transportation Needs (08/03/2023)  Utilities: Not At Risk (08/03/2023)  Depression (PHQ2-9): Low Risk  (11/23/2021)  Financial Resource Strain: Low Risk  (02/20/2023)   Received from [facility]  Social Connections: Moderately Integrated (08/03/2023)  Tobacco Use: High Risk (08/03/2023)   SDOH Interventions: Food Insecurity Interventions: Intervention Not Indicated Housing Interventions: Intervention Not Indicated Transportation Interventions: Intervention Not Indicated Utilities Interventions: Intervention Not Indicated Social Connections Interventions: Intervention Not Indicated   Readmission Risk Interventions    08/11/2023   10:19 AM 07/14/2023    1:03 PM  Readmission Risk Prevention Plan  Transportation Screening Complete Complete  PCP or Specialist Appt within 3-5 Days Complete Complete  Social Work Consult for Recovery Care Planning/Counseling Complete Complete  Palliative Care Screening Not Applicable Not Applicable  Medication Review Oceanographer) Complete Complete

## 2023-08-11 NOTE — Progress Notes (Signed)
 [facility]  ROUNDING NOTE   Subjective:   Seated in chair. No family at bedside. States no more cramping at this time.   On room air  UOP states . However patient refutes this.   Started on spironolactone 25mg  daily yesterday.   K 3.6 (3.3) Na 126 (130)    Objective:  Vital signs in last 24 hours:  Temp:  [97.6 F (36.4 C)-99.3 F (37.4 C)] 98.9 F (37.2 C) (05/26 0802) Pulse Rate:  [60-94] 86 (05/26 0802) Resp:  [16-18] 16 (05/26 0802) BP: (96-132)/(67-78) 96/78 (05/26 0802) SpO2:  [96 %-100 %] 100 % (05/26 0802) Weight:  [74.8 kg] 74.8 kg (05/26 0500)  Weight change: 1.4 kg Filed Weights   08/09/23 0752 08/09/23 1144 08/11/23 0500  Weight: 73.4 kg 71.5 kg 74.8 kg    Intake/Output: I/O last 3 completed shifts: In: 720 [P.O.:720] Out: 100 [Urine:100]   Intake/Output this shift:  Total I/O In: 240 [P.O.:240] Out: -   Physical Exam: General: NAD, seated in chair  Head: Normocephalic, atraumatic. Moist oral mucosal membranes  Eyes: Anicteric, PERRL  Neck: Supple, trachea midline  Lungs:  Clear to auscultation  Heart: Regular rate and rhythm  Abdomen:  Soft, nontender, distended  Extremities:  No peripheral edema.  Neurologic: Nonfocal, moving all four extremities  Skin: No lesions  Access: Rt chest Permcath    Basic Metabolic Panel: Recent Labs  Lab 08/06/23 0811 08/06/23 1416 08/07/23 0552 08/08/23 0239 08/09/23 0451 08/10/23 0545 08/11/23 0508  NA 133*   < > 134* 133* 130* 130* 126*  K 2.7*   < > 3.1* 3.6 3.5 3.3* 3.6  CL 91*   < > 97* 97* 94* 90* 88*  CO2 28   < > 27 27 25 30 22   GLUCOSE 83   < > 100* 106* 113* 120* 86  BUN 106*   < > 66* 33* 50* 30* 47*  CREATININE 3.60*   < > 3.12* 2.52* 3.70* 3.58* 4.89*  CALCIUM  8.9   < > 8.7* 8.6* 8.6* 9.0 9.0  MG  --   --  1.9  --   --   --   --   PHOS 4.9*  --  3.2  --   --   --   --    < > = values in this interval not displayed.    Liver Function Tests: Recent Labs  Lab  08/04/23 1524 08/06/23 0811  ALBUMIN 3.2* 3.3*   No results for input(s): "LIPASE", "AMYLASE" in the last 168 hours. No results for input(s): "AMMONIA" in the last 168 hours.  CBC: Recent Labs  Lab 08/06/23 0811 08/07/23 1609 08/09/23 0802  WBC 6.7 6.8 8.1  HGB 9.4* 9.3* 9.9*  HCT 27.8* 27.5* 30.1*  MCV 84.2 85.1 85.8  PLT 174 181 197    Cardiac Enzymes: No results for input(s): "CKTOTAL", "CKMB", "CKMBINDEX", "TROPONINI" in the last 168 hours.  BNP: Invalid input(s): "POCBNP"  CBG: No results for input(s): "GLUCAP" in the last 168 hours.   Microbiology: Results for orders placed or performed during the hospital encounter of 08/03/23  MRSA Next Gen by PCR, Nasal     Status: None   Collection Time: 08/03/23  4:52 PM   Specimen: Nasal Mucosa; Nasal Swab  Result Value Ref Range Status   MRSA by PCR Next Gen NOT DETECTED NOT DETECTED Final    Comment: (NOTE) The GeneXpert MRSA Assay (FDA approved for NASAL specimens only), is one component of a comprehensive  MRSA colonization surveillance program. It is not intended to diagnose MRSA infection nor to guide or monitor treatment for MRSA infections. Test performance is not FDA approved in patients less than 22 years old. Performed at [facility], [address] [place], [place] [address]     Coagulation Studies: No results for input(s): "LABPROT", "INR" in the last 72 hours.  Urinalysis: No results for input(s): "COLORURINE", "LABSPEC", "PHURINE", "GLUCOSEU", "HGBUR", "BILIRUBINUR", "KETONESUR", "PROTEINUR", "UROBILINOGEN", "NITRITE", "LEUKOCYTESUR" in the last 72 hours.  Invalid input(s): "APPERANCEUR"    Imaging: No results found.   Medications:     allopurinol   100 mg Oral Daily   amLODipine   5 mg Oral BID   apixaban   2.5 mg Oral BID   Chlorhexidine  Gluconate Cloth  6 each Topical Q0600   cholecalciferol   1,000 Units Oral Daily   cloNIDine   0.1 mg Oral BID   epoetin alfa-epbx (RETACRIT)  injection  4,000 Units Intravenous Q T,Th,Sa-HD   furosemide   80 mg Oral Daily   isosorbide  mononitrate  60 mg Oral Daily   pantoprazole   40 mg Oral Daily   rosuvastatin   10 mg Oral QHS   spironolactone  25 mg Oral Daily   nitroGLYCERIN , senna-docusate  Assessment/ Plan:  Gary Mccoy. is a 73 y.o.  male with PMH of HFrEF,CAD, PVD, CVA, COPD, stage IV CKD who is admitted to [facility] on 08/03/2023 for Hyperkalemia [E87.5] AKI (acute kidney injury) (HCC) [N17.9] Acute on chronic congestive heart failure, unspecified heart failure type (HCC) [I50.9] Acute hypoxic respiratory failure (HCC) [J96.01]     Acute Kidney Injury on chronic kidney disease stage IV 2/2 to acute cardiorenal syndrome: Versus progression to end stage renal disease. baseline creatinine EGFR of 18. Chronic kidney disease secondary to renal vascular disease and hypertension. Renal ultrasound shows atrophic right kidney.  Appreciate vascular placing rt internal jugular permcath on 5/20. Awaiting outpatient dialysis clinic search, [facility] [place] and [address]  Hemodialysis treatment for tomorrow, reduce ultrafiltration goal.  Discussed home modalities with patient.   Lab Results  Component Value Date   CREATININE 4.89 (H) 08/11/2023   CREATININE 3.58 (H) 08/10/2023   CREATININE 3.70 (H) 08/09/2023     Intake/Output Summary (Last 24 hours) at 08/11/2023 1124 Last data filed at 08/11/2023 1013 Gross per 24 hour  Intake 240 ml  Output 100 ml  Net 140 ml        Hypertension with chronic kidney disease: with Acute on chronic systolic and diastolic heart failure.  - Continue oral furosemide  80mg  daily and spironolactone 25mg  daily.    3. Anemia with chronic kidney disease: Hemoglobin 9.9.  Continue EPO with hemodialysis treatments.   4. Secondary Hyperparathyroidism: PTH 124. Calcium  and phosphorus at goal during this admission. Not currently on any vitamin D  agents or phosphate binders.    LOS:  8 Gary Mccoy 5/26/202511:24 AM

## 2023-08-11 NOTE — Progress Notes (Signed)
 PROGRESS NOTE    Gary Mccoy.  ZOX:[id number] DOB: 09-Dec-1950 DOA: 08/03/2023 PCP: Sari Cunning, MD    Brief Narrative:  73 year old male with history of combined heart failure, COPD, CKD stage IV, hypertension, hyperlipidemia, history of CVA, dilated cardiomyopathy, history of CAD status post PCI with DES in 2023, PAD, on oral anticoagulation, who presents emergency department for chief concerns of shortness of breath.   Furosemide  80 mg IV one-time dose, DuoNebs one-time treatment.  EDP discussed with nephrology, who is aware of patient and recommends furosemide  gtt. as well.   Per ED provider, patient has been admitted for similar in the past and had apparent plans for CRRT.  Patient does not have hemodialysis access at this time.  Assessment & Plan:   Principal Problem:   Acute hypoxic respiratory failure (HCC) Active Problems:   Acute on chronic respiratory failure with hypoxia (HCC)   Acute renal failure superimposed on stage 4 chronic kidney disease (HCC)   PAD s/p aortobifemoral bypass 2017 (peripheral artery disease)   Benign essential HTN   Leukocytosis   Hyperlipidemia   CVA (cerebral vascular accident) (HCC)   Acute exacerbation of CHF (congestive heart failure) (HCC)   CKD (chronic kidney disease) stage 4, GFR 15-29 ml/min (HCC)   CAD S/P percutaneous coronary angioplasty   Encounter for dialysis and dialysis catheter care [facility])  Acute hypoxic respiratory failure (HCC) Suspect secondary to heart failure exacerbation BPAP weaned off Now weaned off oxygen   HFpEF Volume mgmt primarily with dialysis now, oral lasix  and spiro. Appears euvolemic     Acute renal failure superimposed on stage 4 chronic kidney disease (HCC) Progressed to ESRD Plan: PermCath placement with vascular surgery 5/20. Receiving tts hemodialysis  Hyperlipidemia Continue statin  Benign essential HTN Well controlled with initiation of dialysis Continue home Imdur , spiro,  lasix . Holding home hydral, will also hold amlosipine and clonidine   Hypokalemia Hyponatremia addressed with dialysis  Debility PT advising outpt PT  DVT prophylaxis: Eliquis  Code Status: FULL Family Communication wife updated @ bedside 5/24, shared decision for intermittent updates provided patient remains stable Disposition Plan: Status is: Inpatient Remains inpatient appropriate because: pending outpatient dialysis arrangements   Level of care: Telemetry Medical  Consultants:  Nephrology Vascular surgery  Procedures:  Dialysis catheter placement  Antimicrobials: None   Subjective: Seen and examined resting in chair.  Feeling well  Objective: Vitals:   08/11/23 0409 08/11/23 0500 08/11/23 0802 08/11/23 1156  BP: 132/72  96/78 115/63  Pulse: 94  86 61  Resp: 17  16 16   Temp: 99.3 F (37.4 C)  98.9 F (37.2 C) 98.6 F (37 C)  TempSrc: Oral  Oral   SpO2: 99%  100% 100%  Weight:  74.8 kg    Height:        Intake/Output Summary (Last 24 hours) at 08/11/2023 1349 Last data filed at 08/11/2023 1013 Gross per 24 hour  Intake 240 ml  Output 100 ml  Net 140 ml   Filed Weights   08/09/23 0752 08/09/23 1144 08/11/23 0500  Weight: 73.4 kg 71.5 kg 74.8 kg    Examination:  General exam: NAD Respiratory system: Lungs clear.  Normal work of breathing.    Cardiovascular system: S1-S2, RRR  Gastrointestinal system: Soft, NT/ND   Central nervous system: Alert and oriented. No focal neurological deficits. Extremities: Symmetric 5 x 5 power. No edema Skin: No rashes, lesions or ulcers Psychiatry: calm today    Data Reviewed: I have personally reviewed following labs  and imaging studies  CBC: Recent Labs  Lab 08/06/23 0811 08/07/23 1609 08/09/23 0802  WBC 6.7 6.8 8.1  HGB 9.4* 9.3* 9.9*  HCT 27.8* 27.5* 30.1*  MCV 84.2 85.1 85.8  PLT 174 181 197   Basic Metabolic Panel: Recent Labs  Lab 08/06/23 0811 08/06/23 1416 08/07/23 0552 08/08/23 0239  08/09/23 0451 08/10/23 0545 08/11/23 0508  NA 133*   < > 134* 133* 130* 130* 126*  K 2.7*   < > 3.1* 3.6 3.5 3.3* 3.6  CL 91*   < > 97* 97* 94* 90* 88*  CO2 28   < > 27 27 25 30 22   GLUCOSE 83   < > 100* 106* 113* 120* 86  BUN 106*   < > 66* 33* 50* 30* 47*  CREATININE 3.60*   < > 3.12* 2.52* 3.70* 3.58* 4.89*  CALCIUM  8.9   < > 8.7* 8.6* 8.6* 9.0 9.0  MG  --   --  1.9  --   --   --   --   PHOS 4.9*  --  3.2  --   --   --   --    < > = values in this interval not displayed.   GFR: Estimated Creatinine Clearance: 14.7 mL/min (A) (by C-G formula based on SCr of 4.89 mg/dL (H)). Liver Function Tests: Recent Labs  Lab 08/04/23 1524 08/06/23 0811  ALBUMIN 3.2* 3.3*   No results for input(s): "LIPASE", "AMYLASE" in the last 168 hours. No results for input(s): "AMMONIA" in the last 168 hours. Coagulation Profile: No results for input(s): "INR", "PROTIME" in the last 168 hours. Cardiac Enzymes: No results for input(s): "CKTOTAL", "CKMB", "CKMBINDEX", "TROPONINI" in the last 168 hours. BNP (last 3 results) No results for input(s): "PROBNP" in the last 8760 hours. HbA1C: No results for input(s): "HGBA1C" in the last 72 hours. CBG: No results for input(s): "GLUCAP" in the last 168 hours.  Lipid Profile: No results for input(s): "CHOL", "HDL", "LDLCALC", "TRIG", "CHOLHDL", "LDLDIRECT" in the last 72 hours. Thyroid  Function Tests: No results for input(s): "TSH", "T4TOTAL", "FREET4", "T3FREE", "THYROIDAB" in the last 72 hours. Anemia Panel: No results for input(s): "VITAMINB12", "FOLATE", "FERRITIN", "TIBC", "IRON", "RETICCTPCT" in the last 72 hours. Sepsis Labs: No results for input(s): "PROCALCITON", "LATICACIDVEN" in the last 168 hours.  Recent Results (from the past 240 hours)  MRSA Next Gen by PCR, Nasal     Status: None   Collection Time: 08/03/23  4:52 PM   Specimen: Nasal Mucosa; Nasal Swab  Result Value Ref Range Status   MRSA by PCR Next Gen NOT DETECTED NOT DETECTED  Final    Comment: (NOTE) The GeneXpert MRSA Assay (FDA approved for NASAL specimens only), is one component of a comprehensive MRSA colonization surveillance program. It is not intended to diagnose MRSA infection nor to guide or monitor treatment for MRSA infections. Test performance is not FDA approved in patients less than 35 years old. Performed at [facility], [address], [place], [place] [address]          Radiology Studies: No results found.       Scheduled Meds:  allopurinol   100 mg Oral Daily   apixaban   2.5 mg Oral BID   Chlorhexidine  Gluconate Cloth  6 each Topical Q0600   cholecalciferol   1,000 Units Oral Daily   epoetin alfa-epbx (RETACRIT) injection  4,000 Units Intravenous Q T,Th,Sa-HD   furosemide   80 mg Oral Daily   isosorbide  mononitrate  60  mg Oral Daily   pantoprazole   40 mg Oral Daily   rosuvastatin   10 mg Oral QHS   spironolactone  25 mg Oral Daily   Continuous Infusions:     LOS: 8 days    Raymonde Calico, MD [facility]   If 7PM-7AM, please contact night-coverage  08/11/2023, 1:49 PM

## 2023-08-12 DIAGNOSIS — J9601 Acute respiratory failure with hypoxia: Secondary | ICD-10-CM | POA: Diagnosis not present

## 2023-08-12 LAB — CBC
HCT: 32.4 % — ABNORMAL LOW (ref 39.0–52.0)
Hemoglobin: 10.8 g/dL — ABNORMAL LOW (ref 13.0–17.0)
MCH: 28.3 pg (ref 26.0–34.0)
MCHC: 33.3 g/dL (ref 30.0–36.0)
MCV: 84.8 fL (ref 80.0–100.0)
Platelets: 219 10*3/uL (ref 150–400)
RBC: 3.82 MIL/uL — ABNORMAL LOW (ref 4.22–5.81)
RDW: 15.2 % (ref 11.5–15.5)
WBC: 9.6 10*3/uL (ref 4.0–10.5)
nRBC: 0 % (ref 0.0–0.2)

## 2023-08-12 LAB — BASIC METABOLIC PANEL WITH GFR
Anion gap: 10 (ref 5–15)
BUN: 65 mg/dL — ABNORMAL HIGH (ref 8–23)
CO2: 28 mmol/L (ref 22–32)
Calcium: 9.2 mg/dL (ref 8.9–10.3)
Chloride: 91 mmol/L — ABNORMAL LOW (ref 98–111)
Creatinine, Ser: 5.8 mg/dL — ABNORMAL HIGH (ref 0.61–1.24)
GFR, Estimated: 10 mL/min — ABNORMAL LOW (ref >60)
Glucose, Bld: 104 mg/dL — ABNORMAL HIGH (ref 70–99)
Potassium: 4.2 mmol/L (ref 3.5–5.1)
Sodium: 129 mmol/L — ABNORMAL LOW (ref 135–145)

## 2023-08-12 MED ORDER — HEPARIN SODIUM (PORCINE) 1000 UNIT/ML IJ SOLN
INTRAMUSCULAR | Status: AC
  Filled 2023-08-12: qty 10

## 2023-08-12 MED ORDER — AMLODIPINE BESYLATE 5 MG PO TABS
5.0000 mg | ORAL_TABLET | Freq: Two times a day (BID) | ORAL | Status: DC
  Administered 2023-08-12 – 2023-08-15 (×6): 5 mg via ORAL
  Filled 2023-08-12 (×6): qty 1

## 2023-08-12 MED ORDER — EPOETIN ALFA-EPBX 4000 UNIT/ML IJ SOLN
INTRAMUSCULAR | Status: AC
  Filled 2023-08-12: qty 1

## 2023-08-12 MED ORDER — CLONIDINE HCL 0.1 MG PO TABS
0.1000 mg | ORAL_TABLET | Freq: Two times a day (BID) | ORAL | Status: DC
  Administered 2023-08-12: 0.1 mg via ORAL
  Filled 2023-08-12: qty 1

## 2023-08-12 NOTE — Progress Notes (Signed)
 Hemodialysis Note:  Received patient in bed to unit. Alert and oriented. Informed consent singed and in chart.  Treatment initiated: 0742 Treatment completed: 1100  Access used: Right internal jugular catheter Access issues: None  Patient tolerated well. Transported back to room, alert without acute distress. Report given to patient's RN.  Total UF removed: 500 ml Medications given: Retacrit 4000 units IV  Post HD weight: 70 Kg  [facility]

## 2023-08-12 NOTE — Progress Notes (Signed)
 Physical Therapy Treatment Patient Details Name: Gary Mccoy. MRN: [id number] DOB: 10/03/1950 Today's Date: 08/12/2023   History of Present Illness Pt is a 73 yo male that presented to the ED for SOB. Had permcath placed this admission for HD.  Pmh of  combined heart failure, COPD, CKD stage IV, hypertension, hyperlipidemia, history of CVA, dilated cardiomyopathy, history of CAD status post PCI with DES in 2023, PAD, on oral anticoagulation.    PT Comments  Pt received seated in recliner upon arrival to room and pt agreeable to therapy.  Pt ready to begin ambulation and performed transfers without any complication.  PT ultimately would benefit from continues ambulation and challenge to his balance in future sessions.  Pt ambulates independently without any physical need from therapist at this time.  Pt returned to the room and was left with all needs met and call bell within reach.      If plan is discharge home, recommend the following: Other (comment)   Can travel by private vehicle        Equipment Recommendations  None recommended by PT    Recommendations for Other Services       Precautions / Restrictions Precautions Precautions: Fall Recall of Precautions/Restrictions: Intact Precaution/Restrictions Comments: he denies any falls in last year, but appears impulsive. Restrictions Weight Bearing Restrictions Per Provider Order: No     Mobility  Bed Mobility Overal bed mobility: Modified Independent             General bed mobility comments: Min extra time and effort only    Transfers Overall transfer level: Independent Equipment used: None Transfers: Sit to/from Stand Sit to Stand: Supervision           General transfer comment: Good eccentric and concentric control and stability    Ambulation/Gait Ambulation/Gait assistance: Modified independent (Device/Increase time) Gait Distance (Feet): 320 Feet Assistive device: None Gait Pattern/deviations:  Step-through pattern, Decreased step length - right, Decreased step length - left, Drifts right/left Gait velocity: decreased     General Gait Details: improved steadiness spO2 >96%   Stairs             Wheelchair Mobility     Tilt Bed    Modified Rankin (Stroke Patients Only)       Balance Overall balance assessment: No apparent balance deficits (not formally assessed) Sitting-balance support: Feet supported, No upper extremity supported Sitting balance-Leahy Scale: Normal     Standing balance support: No upper extremity supported, During functional activity Standing balance-Leahy Scale: Good                 High Level Balance Comments: Pt able to ambulate well and would benefit from continued balance training with more difficult tasks.            Communication Communication Communication: No apparent difficulties  Cognition Arousal: Alert Behavior During Therapy: WFL for tasks assessed/performed   PT - Cognitive impairments: No apparent impairments                       PT - Cognition Comments: frustated by current state but oriented, some questionable safety decisions (doffed O2 immediately, ready to ambulate without) Following commands: Intact Following commands impaired: Follows multi-step commands with increased time    Cueing Cueing Techniques: Verbal cues  Exercises      General Comments        Pertinent Vitals/Pain Pain Assessment Pain Assessment: No/denies pain    Home Living  Prior Function            PT Goals (current goals can now be found in the care plan section) Acute Rehab PT Goals Patient Stated Goal: to return home PT Goal Formulation: With patient Time For Goal Achievement: 08/21/23 Potential to Achieve Goals: Good Additional Goals Additional Goal #1: The patient will ambulate >1062ft during 6 MWT to indicate unlimited community ambulator Additional Goal #2: The  patient will demonstrate a BERG score of at least 54 to indicate decreased falls risk Progress towards PT goals: Progressing toward goals    Frequency    Min 1X/week      PT Plan      Co-evaluation              AM-PAC PT "6 Clicks" Mobility   Outcome Measure  Help needed turning from your back to your side while in a flat bed without using bedrails?: None Help needed moving from lying on your back to sitting on the side of a flat bed without using bedrails?: None Help needed moving to and from a bed to a chair (including a wheelchair)?: None Help needed standing up from a chair using your arms (e.g., wheelchair or bedside chair)?: None Help needed to walk in hospital room?: None Help needed climbing 3-5 steps with a railing? : None 6 Click Score: 24    End of Session Equipment Utilized During Treatment: Gait belt Activity Tolerance: Patient tolerated treatment well Patient left: in chair;with call bell/phone within reach Nurse Communication: Mobility status PT Visit Diagnosis: Muscle weakness (generalized) (M62.81)     Time: 8295-6213 PT Time Calculation (min) (ACUTE ONLY): 14 min  Charges:    $Therapeutic Activity: 8-22 mins                       Rozanna Corner, PT, DPT Physical Therapist - [facility]  08/12/23, 5:08 PM

## 2023-08-12 NOTE — Progress Notes (Signed)
 [facility]  ROUNDING NOTE   Subjective:  [name] was admitted to [facility] on 08/03/2023 with recurrent shortness of breath and weight gain.  Patient is followed by Dr Zelda Hickman outpatient.   Patient well-known to us  from last hospitalization where he was significantly fluid overloaded and initially started on dialysis. He did have some renal recovery though his urine output was a bit lower than expected close to his discharge.  He was seen back in the office on 07/28/2023. Since then he has gained additional weight. Upon readmission patient restarted on Lasix  drip.  Update:   Patient seen and evaluated during dialysis   HEMODIALYSIS FLOWSHEET:  Blood Flow Rate (mL/min): 349 mL/min Arterial Pressure (mmHg): -158.58 mmHg Venous Pressure (mmHg): 125.45 mmHg TMP (mmHg): 7.27 mmHg Ultrafiltration Rate (mL/min): 400 mL/min Dialysate Flow Rate (mL/min): 300 ml/min Dialysis Fluid Bolus: Normal Saline  No complaints to offer at this time.    Objective:  Vital signs in last 24 hours:  Temp:  [97.9 F (36.6 C)-98.6 F (37 C)] 98.3 F (36.8 C) (05/27 0734) Pulse Rate:  [60-74] 71 (05/27 1030) Resp:  [11-19] 11 (05/27 1030) BP: (105-171)/(59-79) 170/72 (05/27 1030) SpO2:  [87 %-100 %] 100 % (05/27 1030) Weight:  [70.6 kg] 70.6 kg (05/27 0734)  Weight change:  Filed Weights   08/09/23 1144 08/11/23 0500 08/12/23 0734  Weight: 71.5 kg 74.8 kg 70.6 kg    Intake/Output: I/O last 3 completed shifts: In: 240 [P.O.:240] Out: -    Intake/Output this shift:  No intake/output data recorded.  Physical Exam: General: No acute distress.  Head: Normocephalic, atraumatic. Moist oral mucosal membranes  Eyes: Anicteric  Lungs:  Basilar rales, normal effort  Heart: Regular rate and rhythm  Abdomen:  Soft, nontender  Extremities: No peripheral edema.  Neurologic: Alert, moving all four extremities  Skin: No lesions  Access: Rt internal jugular permcath     Basic Metabolic Panel: Recent Labs  Lab 08/06/23 0811 08/06/23 1416 08/07/23 0552 08/08/23 0239 08/09/23 0451 08/10/23 0545 08/11/23 0508 08/12/23 0506  NA 133*   < > 134* 133* 130* 130* 126* 129*  K 2.7*   < > 3.1* 3.6 3.5 3.3* 3.6 4.2  CL 91*   < > 97* 97* 94* 90* 88* 91*  CO2 28   < > 27 27 25 30 22 28   GLUCOSE 83   < > 100* 106* 113* 120* 86 104*  BUN 106*   < > 66* 33* 50* 30* 47* 65*  CREATININE 3.60*   < > 3.12* 2.52* 3.70* 3.58* 4.89* 5.80*  CALCIUM  8.9   < > 8.7* 8.6* 8.6* 9.0 9.0 9.2  MG  --   --  1.9  --   --   --   --   --   PHOS 4.9*  --  3.2  --   --   --   --   --    < > = values in this interval not displayed.    Liver Function Tests: Recent Labs  Lab 08/06/23 0811  ALBUMIN 3.3*   No results for input(s): "LIPASE", "AMYLASE" in the last 168 hours. No results for input(s): "AMMONIA" in the last 168 hours.  CBC: Recent Labs  Lab 08/06/23 0811 08/07/23 1609 08/09/23 0802 08/12/23 0506  WBC 6.7 6.8 8.1 9.6  HGB 9.4* 9.3* 9.9* 10.8*  HCT 27.8* 27.5* 30.1* 32.4*  MCV 84.2 85.1 85.8 84.8  PLT 174 181 197 219  Cardiac Enzymes: No results for input(s): "CKTOTAL", "CKMB", "CKMBINDEX", "TROPONINI" in the last 168 hours.  BNP: Invalid input(s): "POCBNP"  CBG: No results for input(s): "GLUCAP" in the last 168 hours.    Microbiology: Results for orders placed or performed during the hospital encounter of 08/03/23  MRSA Next Gen by PCR, Nasal     Status: None   Collection Time: 08/03/23  4:52 PM   Specimen: Nasal Mucosa; Nasal Swab  Result Value Ref Range Status   MRSA by PCR Next Gen NOT DETECTED NOT DETECTED Final    Comment: (NOTE) The GeneXpert MRSA Assay (FDA approved for NASAL specimens only), is one component of a comprehensive MRSA colonization surveillance program. It is not intended to diagnose MRSA infection nor to guide or monitor treatment for MRSA infections. Test performance is not FDA approved in patients less than 60  years old. Performed at [facility], [address] [place], [place] [address]     Coagulation Studies: No results for input(s): "LABPROT", "INR" in the last 72 hours.   Urinalysis: No results for input(s): "COLORURINE", "LABSPEC", "PHURINE", "GLUCOSEU", "HGBUR", "BILIRUBINUR", "KETONESUR", "PROTEINUR", "UROBILINOGEN", "NITRITE", "LEUKOCYTESUR" in the last 72 hours.  Invalid input(s): "APPERANCEUR"    Imaging: No results found.      Medications:       allopurinol   100 mg Oral Daily   apixaban   2.5 mg Oral BID   Chlorhexidine  Gluconate Cloth  6 each Topical Q0600   cholecalciferol   1,000 Units Oral Daily   epoetin alfa-epbx (RETACRIT) injection  4,000 Units Intravenous Q T,Th,Sa-HD   furosemide   80 mg Oral Daily   isosorbide  mononitrate  60 mg Oral Daily   pantoprazole   40 mg Oral Daily   rosuvastatin   10 mg Oral QHS   spironolactone  25 mg Oral Daily   nitroGLYCERIN , senna-docusate  Assessment/ Plan:  [name] is a 73 y.o.  male with PMH of HFrEF,CAD, PVD, CVA, COPD, stage IV CKD who is admitted to [facility] on 06/21/2022 for Hyperkalemia [E87.5] AKI (acute kidney injury) (HCC) [N17.9] Acute on chronic congestive heart failure, unspecified heart failure type (HCC) [I50.9] Acute hypoxic respiratory failure (HCC) [J96.01]  Acute Kidney Injury on chronic kidney disease stage IV 2/2 to acute cardiorenal syndrome: baseline creatinine EGFR of 18.. 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 Renal ultrasound shows atrophic right kidney.   Awaiting outpatient dialysis clinic search, [facility] and [address]  Patient receiving dialysis treatment today, UF goal 0.5 L as tolerated.  Next treatment scheduled for Wednesday.  Lab Results  Component Value Date   CREATININE 5.80 (H) 08/12/2023   CREATININE 4.89 (H) 08/11/2023   CREATININE 3.58 (H) 08/10/2023    No intake or output data in the 24 hours ending 08/12/23 1100      Acute on chronic systolic and diastolic heart failure. Continue oral furosemide  80mg  and spironolactone 25 mg daily.   3. Anemia with chronic kidney disease: Hemoglobin 9.3.  Will order low dose epo with dialysis.    4. Secondary Hyperparathyroidism: with outpatient labs: None available Lab Results  Component Value Date   PTH 124 (H) 07/13/2023   CALCIUM  9.2 08/12/2023   PHOS 3.2 08/07/2023    Will continue to monitor bone minerals during this admission.   Mccoy: 9 Divya Munshi 5/27/202511:00 AM

## 2023-08-12 NOTE — Progress Notes (Signed)
 PROGRESS NOTE    Gary Mccoy.  JXB:[id number] DOB: 06/01/1950 DOA: 08/03/2023 PCP: Sari Cunning, MD    Brief Narrative:  73 year old male with history of combined heart failure, COPD, CKD stage IV, hypertension, hyperlipidemia, history of CVA, dilated cardiomyopathy, history of CAD status post PCI with DES in 2023, PAD, on oral anticoagulation, who presents emergency department for chief concerns of shortness of breath.   Furosemide  80 mg IV one-time dose, DuoNebs one-time treatment.  EDP discussed with nephrology, who is aware of patient and recommends furosemide  gtt. as well.   Per ED provider, patient has been admitted for similar in the past and had apparent plans for CRRT.  Patient does not have hemodialysis access at this time.  Assessment & Plan:   Principal Problem:   Acute hypoxic respiratory failure (HCC) Active Problems:   Acute on chronic respiratory failure with hypoxia (HCC)   Acute renal failure superimposed on stage 4 chronic kidney disease (HCC)   PAD s/p aortobifemoral bypass 2017 (peripheral artery disease)   Benign essential HTN   Leukocytosis   Hyperlipidemia   CVA (cerebral vascular accident) (HCC)   Acute exacerbation of CHF (congestive heart failure) (HCC)   CKD (chronic kidney disease) stage 4, GFR 15-29 ml/min (HCC)   CAD S/P percutaneous coronary angioplasty   Encounter for dialysis and dialysis catheter care [facility])  Acute hypoxic respiratory failure (HCC) Suspect secondary to heart failure exacerbation BPAP weaned off Now weaned off oxygen  HFpEF Volume mgmt primarily with dialysis now, oral lasix  and spiro. Appears euvolemic     Acute renal failure superimposed on stage 4 chronic kidney disease (HCC) Progressed to ESRD Plan: PermCath placement with vascular surgery 5/20. Receiving tts hemodialysis  Hyperlipidemia Continue statin  Benign essential HTN Well controlled with initiation of dialysis though up today as no ultrafiltration  today Continue home Imdur , spiro, lasix , add back home amlodipine  - home hydral and clonidine  are on hold  Hypokalemia Hyponatremia addressed with dialysis  Debility PT advising outpt PT  DVT prophylaxis: Eliquis  Code Status: FULL Family Communication wife updated telephonically 5/27 Disposition Plan: Status is: Inpatient Remains inpatient appropriate because: pending outpatient dialysis arrangements   Level of care: Telemetry Medical  Consultants:  Nephrology Vascular surgery  Procedures:  Dialysis catheter placement  Antimicrobials: None   Subjective: Seen and examined resting in chair.  Feeling well after dialysis no cramping or nausea  Objective: Vitals:   08/12/23 0930 08/12/23 1000 08/12/23 1030 08/12/23 1100  BP: (!) 169/76 (!) 171/79 (!) 170/72 (!) 173/76  Pulse: 73 67 71 67  Resp: 19 14 11 18   Temp:    97.9 F (36.6 C)  TempSrc:    Oral  SpO2: 99% 97% 100% 100%  Weight:    70 kg  Height:        Intake/Output Summary (Last 24 hours) at 08/12/2023 1156 Last data filed at 08/12/2023 1100 Gross per 24 hour  Intake --  Output 500 ml  Net -500 ml   Filed Weights   08/11/23 0500 08/12/23 0734 08/12/23 1100  Weight: 74.8 kg 70.6 kg 70 kg    Examination:  General exam: NAD Respiratory system: Lungs clear.  Normal work of breathing.    Cardiovascular system: S1-S2, RRR  Gastrointestinal system: Soft, NT/ND   Central nervous system: Alert and oriented. No focal neurological deficits. Extremities: Symmetric 5 x 5 power. No edema Skin: No rashes, lesions or ulcers Psychiatry: calm today    Data Reviewed: I have personally reviewed  following labs and imaging studies  CBC: Recent Labs  Lab 08/06/23 0811 08/07/23 1609 08/09/23 0802 08/12/23 0506  WBC 6.7 6.8 8.1 9.6  HGB 9.4* 9.3* 9.9* 10.8*  HCT 27.8* 27.5* 30.1* 32.4*  MCV 84.2 85.1 85.8 84.8  PLT 174 181 197 219   Basic Metabolic Panel: Recent Labs  Lab 08/06/23 0811 08/06/23 1416  08/07/23 0552 08/08/23 0239 08/09/23 0451 08/10/23 0545 08/11/23 0508 08/12/23 0506  NA 133*   < > 134* 133* 130* 130* 126* 129*  K 2.7*   < > 3.1* 3.6 3.5 3.3* 3.6 4.2  CL 91*   < > 97* 97* 94* 90* 88* 91*  CO2 28   < > 27 27 25 30 22 28   GLUCOSE 83   < > 100* 106* 113* 120* 86 104*  BUN 106*   < > 66* 33* 50* 30* 47* 65*  CREATININE 3.60*   < > 3.12* 2.52* 3.70* 3.58* 4.89* 5.80*  CALCIUM  8.9   < > 8.7* 8.6* 8.6* 9.0 9.0 9.2  MG  --   --  1.9  --   --   --   --   --   PHOS 4.9*  --  3.2  --   --   --   --   --    < > = values in this interval not displayed.   GFR: Estimated Creatinine Clearance: 11.6 mL/min (A) (by C-G formula based on SCr of 5.8 mg/dL (H)). Liver Function Tests: Recent Labs  Lab 08/06/23 0811  ALBUMIN 3.3*   No results for input(s): "LIPASE", "AMYLASE" in the last 168 hours. No results for input(s): "AMMONIA" in the last 168 hours. Coagulation Profile: No results for input(s): "INR", "PROTIME" in the last 168 hours. Cardiac Enzymes: No results for input(s): "CKTOTAL", "CKMB", "CKMBINDEX", "TROPONINI" in the last 168 hours. BNP (last 3 results) No results for input(s): "PROBNP" in the last 8760 hours. HbA1C: No results for input(s): "HGBA1C" in the last 72 hours. CBG: No results for input(s): "GLUCAP" in the last 168 hours.  Lipid Profile: No results for input(s): "CHOL", "HDL", "LDLCALC", "TRIG", "CHOLHDL", "LDLDIRECT" in the last 72 hours. Thyroid  Function Tests: No results for input(s): "TSH", "T4TOTAL", "FREET4", "T3FREE", "THYROIDAB" in the last 72 hours. Anemia Panel: No results for input(s): "VITAMINB12", "FOLATE", "FERRITIN", "TIBC", "IRON", "RETICCTPCT" in the last 72 hours. Sepsis Labs: No results for input(s): "PROCALCITON", "LATICACIDVEN" in the last 168 hours.  Recent Results (from the past 240 hours)  MRSA Next Gen by PCR, Nasal     Status: None   Collection Time: 08/03/23  4:52 PM   Specimen: Nasal Mucosa; Nasal Swab  Result Value  Ref Range Status   MRSA by PCR Next Gen NOT DETECTED NOT DETECTED Final    Comment: (NOTE) The GeneXpert MRSA Assay (FDA approved for NASAL specimens only), is one component of a comprehensive MRSA colonization surveillance program. It is not intended to diagnose MRSA infection nor to guide or monitor treatment for MRSA infections. Test performance is not FDA approved in patients less than 10 years old. Performed at [facility], [address] [place], [place] [address]          Radiology Studies: No results found.       Scheduled Meds:  allopurinol   100 mg Oral Daily   apixaban   2.5 mg Oral BID   Chlorhexidine  Gluconate Cloth  6 each Topical Q0600   cholecalciferol   1,000 Units Oral Daily   cloNIDine   0.1 mg Oral BID   epoetin alfa-epbx (RETACRIT) injection  4,000 Units Intravenous Q T,Th,Sa-HD   furosemide   80 mg Oral Daily   isosorbide  mononitrate  60 mg Oral Daily   pantoprazole   40 mg Oral Daily   rosuvastatin   10 mg Oral QHS   spironolactone  25 mg Oral Daily   Continuous Infusions:     LOS: 9 days    Raymonde Calico, MD [facility]   If 7PM-7AM, please contact night-coverage  08/12/2023, 11:56 AM

## 2023-08-12 NOTE — Plan of Care (Signed)

## 2023-08-13 ENCOUNTER — Other Ambulatory Visit (HOSPITAL_COMMUNITY): Payer: Self-pay

## 2023-08-13 DIAGNOSIS — I739 Peripheral vascular disease, unspecified: Secondary | ICD-10-CM

## 2023-08-13 DIAGNOSIS — I10 Essential (primary) hypertension: Secondary | ICD-10-CM

## 2023-08-13 DIAGNOSIS — D72829 Elevated white blood cell count, unspecified: Secondary | ICD-10-CM

## 2023-08-13 DIAGNOSIS — Z4901 Encounter for fitting and adjustment of extracorporeal dialysis catheter: Secondary | ICD-10-CM

## 2023-08-13 DIAGNOSIS — I639 Cerebral infarction, unspecified: Secondary | ICD-10-CM

## 2023-08-13 DIAGNOSIS — I509 Heart failure, unspecified: Secondary | ICD-10-CM

## 2023-08-13 DIAGNOSIS — E785 Hyperlipidemia, unspecified: Secondary | ICD-10-CM

## 2023-08-13 DIAGNOSIS — Z9861 Coronary angioplasty status: Secondary | ICD-10-CM

## 2023-08-13 DIAGNOSIS — J9601 Acute respiratory failure with hypoxia: Secondary | ICD-10-CM | POA: Diagnosis not present

## 2023-08-13 DIAGNOSIS — J9621 Acute and chronic respiratory failure with hypoxia: Secondary | ICD-10-CM

## 2023-08-13 DIAGNOSIS — R079 Chest pain, unspecified: Secondary | ICD-10-CM

## 2023-08-13 DIAGNOSIS — N179 Acute kidney failure, unspecified: Secondary | ICD-10-CM | POA: Diagnosis not present

## 2023-08-13 DIAGNOSIS — R0602 Shortness of breath: Secondary | ICD-10-CM

## 2023-08-13 DIAGNOSIS — I2510 Atherosclerotic heart disease of native coronary artery without angina pectoris: Secondary | ICD-10-CM

## 2023-08-13 MED ORDER — SPIRONOLACTONE 25 MG PO TABS
25.0000 mg | ORAL_TABLET | Freq: Every day | ORAL | Status: DC
  Administered 2023-08-15: 25 mg via ORAL
  Filled 2023-08-13: qty 1

## 2023-08-13 NOTE — Progress Notes (Signed)
 Physical Therapy Treatment Patient Details Name: Gary Mccoy. MRN: [id number] DOB: [birth year] Today's Date: 08/13/2023   History of Present Illness Pt is a 73 yo male that presented to the ED for SOB. Had permcath placed this admission for HD.  Pmh of  combined heart failure, COPD, CKD stage IV, hypertension, hyperlipidemia, history of CVA, dilated cardiomyopathy, history of CAD status post PCI with DES in 2023, PAD, on oral anticoagulation.    PT Comments  Pt recently ambulates 2 laps with mobility techs then was able to exit bed, stand, and tolerate ambulation > 547ft without AD or o2 during PT session. Acute PT will sign off. Mobility to continue to mobilize pt throughout remainder of admission.    If plan is discharge home, recommend the following:  Pt does not want post acute PT.      Equipment Recommendations  None recommended by PT       Precautions / Restrictions Precautions Precautions: Fall Recall of Precautions/Restrictions: Intact Restrictions Weight Bearing Restrictions Per Provider Order: No     Mobility  Bed Mobility Overal bed mobility: Modified Independent   Transfers Overall transfer level: Independent Equipment used: None Transfers: Sit to/from Stand Sit to Stand: Supervision   Ambulation/Gait Ambulation/Gait assistance: Modified independent (Device/Increase time) Gait Distance (Feet): 600 Feet Assistive device: None Gait Pattern/deviations: Step-through pattern, Decreased step length - right, Decreased step length - left, Drifts right/left Gait velocity: WNL  General Gait Details: Pt was easily able to ambulate on rm air without AD.    Balance Overall balance assessment: Mild deficits observed, not formally tested     Communication Communication Communication: No apparent difficulties  Cognition Arousal: Alert Behavior During Therapy: WFL for tasks assessed/performed   PT - Cognitive impairments: No apparent impairments      Following  commands: Intact Following commands impaired: Follows multi-step commands with increased time    Cueing Cueing Techniques: Verbal cues         Pertinent Vitals/Pain Pain Assessment Pain Assessment: No/denies pain Pain Score: 0-No pain Pain Location: hip pain Pain Descriptors / Indicators: Sore Pain Intervention(s): Limited activity within patient's tolerance, Monitored during session, Premedicated before session, Repositioned     PT Goals (current goals can now be found in the care plan section) Acute Rehab PT Goals Patient Stated Goal: to return home Progress towards PT goals: Progressing toward goals    Frequency    Min 1X/week       AM-PAC PT "6 Clicks" Mobility   Outcome Measure  Help needed turning from your back to your side while in a flat bed without using bedrails?: None Help needed moving from lying on your back to sitting on the side of a flat bed without using bedrails?: None Help needed moving to and from a bed to a chair (including a wheelchair)?: None Help needed standing up from a chair using your arms (e.g., wheelchair or bedside chair)?: None Help needed to walk in hospital room?: None Help needed climbing 3-5 steps with a railing? : None 6 Click Score: 24    End of Session   Activity Tolerance: Patient tolerated treatment well Patient left: Other (comment) (pt was seated EOB post session) Nurse Communication: Mobility status PT Visit Diagnosis: Muscle weakness (generalized) (M62.81)     Time: 1308-6578 PT Time Calculation (min) (ACUTE ONLY): 18 min  Charges:    $Gait Training: 8-22 mins PT General Charges $$ ACUTE PT VISIT: 1 Visit  Chester Costa PTA 08/13/23, 3:52 PM

## 2023-08-13 NOTE — Progress Notes (Signed)
 PROGRESS NOTE    Gary Mccoy.  ZOX:[id number] DOB: [birth year] DOA: 08/03/2023 PCP: Sari Cunning, MD  Chief Complaint  Patient presents with   Shortness of Breath    Hospital Course:  Gary Mccoy. is a 73 year old male with hide for heart failure, COPD, hypertension, hyperlipidemia, history of CVA, dilated cardiomyopathy, history of CAD status post PCI with DES in 2023, PAD, on anticoagulation, with known CKD stage IV who presented to the ED with shortness of breath.  Patient was found to be acutely volume overloaded.  He was started on IV diuresis.  Ultimately his CKD progressed and he was declared ESRD.  He underwent PermCath placement with vascular surgery on 5/20.  He began receiving hemodialysis without issue.  Stay was prolonged pending outpatient hemodialysis arrangements.  Subjective: No acute events overnight.  Patient has no complaints this morning.   Objective: Vitals:   08/12/23 1940 08/12/23 2357 08/13/23 0412 08/13/23 0807  BP: (!) 129/56 125/61 133/64 (!) 140/63  Pulse:    (!) 55  Resp: 18 18 18 15   Temp: 98.2 F (36.8 C) 97.6 F (36.4 C) 98 F (36.7 C) 98.3 F (36.8 C)  TempSrc:      SpO2: 100% 100% 100% 99%  Weight:      Height:        Intake/Output Summary (Last 24 hours) at 08/13/2023 1257 Last data filed at 08/12/2023 1911 Gross per 24 hour  Intake 240 ml  Output --  Net 240 ml   Filed Weights   08/11/23 0500 08/12/23 0734 08/12/23 1100  Weight: 74.8 kg 70.6 kg 70 kg    Examination: General exam: Appears calm and comfortable, NAD  Respiratory system: No work of breathing, symmetric chest wall expansion Cardiovascular system: S1 & S2 heard, RRR.  Gastrointestinal system: Abdomen is nondistended, soft and nontender.  Neuro: Alert and oriented. No focal neurological deficits. Extremities: Symmetric, expected ROM Skin: No rashes, lesions Psychiatry: Demonstrates appropriate judgement and insight. Mood & affect appropriate for  situation.   Assessment & Plan:  Principal Problem:   Acute hypoxic respiratory failure (HCC) Active Problems:   Acute on chronic respiratory failure with hypoxia (HCC)   Acute renal failure superimposed on stage 4 chronic kidney disease (HCC)   PAD s/p aortobifemoral bypass 2017 (peripheral artery disease)   Benign essential HTN   Leukocytosis   Hyperlipidemia   CVA (cerebral vascular accident) (HCC)   Acute exacerbation of CHF (congestive heart failure) (HCC)   CKD (chronic kidney disease) stage 4, GFR 15-29 ml/min (HCC)   CAD S/P percutaneous coronary angioplasty   Encounter for dialysis and dialysis catheter care [facility])   Acute renal failure superimposed on stage IV CKD - Patient has now been declared ESRD - PermCath placement with vascular surgery 5/20 - Nephrology consulted.  HD per nephro. - Currently tolerating hemodialysis well - Pending outpatient hemodialysis arrangements  Acute hypoxic respiratory failure, resolved - Secondary to heart failure exacerbation.  Initially required BiPAP - Now stable on room air  Heart failure preserved EF Echo 07/13/2023.  LVEF 45 to 50%, LV with global hypokinesis.  Grade 1 diastolic dysfunction. - Euvolemic now - Currently managed with dialysis - Did require Lasix  drip earlier this admission. - Will also continue high-dose Lasix .  Will discontinue spironolactone   Hyperlipidemia - Continue statin  Hypertension - Currently on Lasix , Imdur , amlodipine , spironolactone   - Hydralazine  and clonidine  currently on hold. - Continue to monitor blood pressure closely.  Titrate medications as needed - Expect  further improvement with dialysis  Secondary hyperparathyroidism - Management per renal.  Hypokalemia Hyponatremia - Address as needed with dialysis  Debility - Physical therapy as tolerated.  Outpatient PT order will be placed at discharge  DVT prophylaxis: Eliquis    Code Status: Full Code Disposition:  Pending outpatient HD  arrangements  Consultants:  Treatment Team:  Consulting Physician: Worthy Heads, MD Consulting Physician: Jackquelyn Mass, MD  Procedures:  PermaCath placement  Antimicrobials:  Anti-infectives (From admission, onward)    Start     Dose/Rate Route Frequency Ordered Stop   08/05/23 0900  ceFAZolin  (ANCEF ) IVPB 1 g/50 mL premix        1 g 100 mL/hr over 30 Minutes Intravenous 30 min pre-op 08/05/23 0444 08/05/23 1442   08/05/23 0600  ceFAZolin  (ANCEF ) IVPB 1 g/50 mL premix  Status:  Discontinued        1 g 100 mL/hr over 30 Minutes Intravenous 30 min pre-op 08/05/23 0318 08/05/23 0444       Data Reviewed: I have personally reviewed following labs and imaging studies CBC: Recent Labs  Lab 08/07/23 1609 08/09/23 0802 08/12/23 0506  WBC 6.8 8.1 9.6  HGB 9.3* 9.9* 10.8*  HCT 27.5* 30.1* 32.4*  MCV 85.1 85.8 84.8  PLT 181 197 219   Basic Metabolic Panel: Recent Labs  Lab 08/07/23 0552 08/08/23 0239 08/09/23 0451 08/10/23 0545 08/11/23 0508 08/12/23 0506  NA 134* 133* 130* 130* 126* 129*  K 3.1* 3.6 3.5 3.3* 3.6 4.2  CL 97* 97* 94* 90* 88* 91*  CO2 27 27 25 30 22 28   GLUCOSE 100* 106* 113* 120* 86 104*  BUN 66* 33* 50* 30* 47* 65*  CREATININE 3.12* 2.52* 3.70* 3.58* 4.89* 5.80*  CALCIUM  8.7* 8.6* 8.6* 9.0 9.0 9.2  MG 1.9  --   --   --   --   --   PHOS 3.2  --   --   --   --   --    GFR: Estimated Creatinine Clearance: 11.6 mL/min (A) (by C-G formula based on SCr of 5.8 mg/dL (H)). Liver Function Tests: No results for input(s): "AST", "ALT", "ALKPHOS", "BILITOT", "PROT", "ALBUMIN" in the last 168 hours. CBG: No results for input(s): "GLUCAP" in the last 168 hours.  Recent Results (from the past 240 hours)  MRSA Next Gen by PCR, Nasal     Status: None   Collection Time: 08/03/23  4:52 PM   Specimen: Nasal Mucosa; Nasal Swab  Result Value Ref Range Status   MRSA by PCR Next Gen NOT DETECTED NOT DETECTED Final    Comment: (NOTE) The GeneXpert MRSA  Assay (FDA approved for NASAL specimens only), is one component of a comprehensive MRSA colonization surveillance program. It is not intended to diagnose MRSA infection nor to guide or monitor treatment for MRSA infections. Test performance is not FDA approved in patients less than 3 years old. Performed at [facility], [address], [place], [place] [address]      Radiology Studies: No results found.  Scheduled Meds:  allopurinol   100 mg Oral Daily   amLODipine   5 mg Oral BID   apixaban   2.5 mg Oral BID   Chlorhexidine  Gluconate Cloth  6 each Topical Q0600   cholecalciferol   1,000 Units Oral Daily   furosemide   80 mg Oral Daily   isosorbide  mononitrate  60 mg Oral Daily   pantoprazole   40 mg Oral Daily   rosuvastatin   10 mg Oral QHS  Continuous Infusions:   LOS: 10 days  MDM: Patient is high risk for one or more organ failure.  They necessitate ongoing hospitalization for continued IV therapies and subsequent lab monitoring. Total time spent interpreting labs and vitals, reviewing the medical record, coordinating care amongst consultants and care team members, directly assessing and discussing care with the patient and/or family: 55 min  Chakita Mcgraw, DO [facility]  To contact the attending physician between 7A-7P please use Epic Chat. To contact the covering physician during after hours 7P-7A, please review Amion.  08/13/2023, 12:57 PM   *This document has been created with the assistance of dictation software. Please excuse typographical errors. *

## 2023-08-13 NOTE — TOC Progression Note (Signed)
 Transition of Care (TOC) - Progression Note    Patient Details  Name: Gary Mccoy. MRN: [id number] Date of Birth: [birth year]  Transition of Care [facility]) CM/SW Contact  Elsie Halo, RN Phone Number: 08/13/2023, 4:30 PM  Clinical Narrative:     [facility] visited patient and his wife in the room. TOC offered choice for outpatient PT and patient continues to refuse.  TOC will continue to follow  Expected Discharge Plan: OP Rehab Barriers to Discharge: Continued Medical Work up  Expected Discharge Plan and Services       Living arrangements for the past 2 months: Single Family Home                                       Social Determinants of Health (SDOH) Interventions SDOH Screenings   Food Insecurity: No Food Insecurity (08/03/2023)  Housing: Low Risk  (08/03/2023)  Transportation Needs: No Transportation Needs (08/03/2023)  Utilities: Not At Risk (08/03/2023)  Depression (PHQ2-9): Low Risk  (11/23/2021)  Financial Resource Strain: Low Risk  (02/20/2023)   Received from [facility]  Social Connections: Moderately Integrated (08/03/2023)  Tobacco Use: High Risk (08/03/2023)    Readmission Risk Interventions    08/11/2023   10:19 AM 07/14/2023    1:03 PM  Readmission Risk Prevention Plan  Transportation Screening Complete Complete  PCP or Specialist Appt within 3-5 Days Complete Complete  Social Work Consult for Recovery Care Planning/Counseling Complete Complete  Palliative Care Screening Not Applicable Not Applicable  Medication Review Oceanographer) Complete Complete

## 2023-08-13 NOTE — TOC Benefit Eligibility Note (Signed)
 Pharmacy Patient Advocate Encounter  Insurance verification completed.    The patient is insured through [employer]. Patient has Medicare and is not eligible for a copay card, but may be able to apply for patient assistance or Medicare RX Payment Plan (Patient Must reach out to their plan, if eligible for payment plan), if available.    Ran test claim for Entresto 24-26mg  and the current 30 day co-pay is $47.00.   This test claim was processed through [facility]- copay amounts may vary at other pharmacies due to pharmacy/plan contracts, or as the patient moves through the different stages of their insurance plan.

## 2023-08-13 NOTE — Progress Notes (Signed)
 [facility]  ROUNDING NOTE   Subjective:  [name] was admitted to [facility] on 08/03/2023 with recurrent shortness of breath and weight gain.  Patient is followed by Dr Zelda Hickman outpatient.   Patient well-known to us  from last hospitalization where he was significantly fluid overloaded and initially started on dialysis. He did have some renal recovery though his urine output was a bit lower than expected close to his discharge.  He was seen back in the office on 07/28/2023. Since then he has gained additional weight. Upon readmission patient restarted on Lasix  drip.  Update:   Patient seen sitting up in bed Alert and oriented Family at bedside Remains on room air Denies pain or discomfort    Objective:  Vital signs in last 24 hours:  Temp:  [97.6 F (36.4 C)-98.3 F (36.8 C)] 98.3 F (36.8 C) (05/28 0807) Pulse Rate:  [55-78] 55 (05/28 0807) Resp:  [15-18] 15 (05/28 0807) BP: (125-171)/(56-77) 140/63 (05/28 0807) SpO2:  [97 %-100 %] 99 % (05/28 0807)  Weight change:  Filed Weights   08/11/23 0500 08/12/23 0734 08/12/23 1100  Weight: 74.8 kg 70.6 kg 70 kg    Intake/Output: I/O last 3 completed shifts: In: 240 [P.O.:240] Out: 500 [Other:500]   Intake/Output this shift:  No intake/output data recorded.  Physical Exam: General: No acute distress.  Head: Normocephalic, atraumatic. Moist oral mucosal membranes  Eyes: Anicteric  Lungs:  Diminished, normal effort  Heart: Regular rate and rhythm  Abdomen:  Soft, nontender  Extremities: No peripheral edema.  Neurologic: Alert, moving all four extremities  Skin: No lesions  Access: Rt internal jugular permcath    Basic Metabolic Panel: Recent Labs  Lab 08/07/23 0552 08/08/23 0239 08/09/23 0451 08/10/23 0545 08/11/23 0508 08/12/23 0506  NA 134* 133* 130* 130* 126* 129*  K 3.1* 3.6 3.5 3.3* 3.6 4.2  CL 97* 97* 94* 90* 88* 91*  CO2 27 27 25 30 22 28   GLUCOSE 100* 106* 113* 120* 86 104*   BUN 66* 33* 50* 30* 47* 65*  CREATININE 3.12* 2.52* 3.70* 3.58* 4.89* 5.80*  CALCIUM  8.7* 8.6* 8.6* 9.0 9.0 9.2  MG 1.9  --   --   --   --   --   PHOS 3.2  --   --   --   --   --     Liver Function Tests: No results for input(s): "AST", "ALT", "ALKPHOS", "BILITOT", "PROT", "ALBUMIN" in the last 168 hours.  No results for input(s): "LIPASE", "AMYLASE" in the last 168 hours. No results for input(s): "AMMONIA" in the last 168 hours.  CBC: Recent Labs  Lab 08/07/23 1609 08/09/23 0802 08/12/23 0506  WBC 6.8 8.1 9.6  HGB 9.3* 9.9* 10.8*  HCT 27.5* 30.1* 32.4*  MCV 85.1 85.8 84.8  PLT 181 197 219    Cardiac Enzymes: No results for input(s): "CKTOTAL", "CKMB", "CKMBINDEX", "TROPONINI" in the last 168 hours.  BNP: Invalid input(s): "POCBNP"  CBG: No results for input(s): "GLUCAP" in the last 168 hours.    Microbiology: Results for orders placed or performed during the hospital encounter of 08/03/23  MRSA Next Gen by PCR, Nasal     Status: None   Collection Time: 08/03/23  4:52 PM   Specimen: Nasal Mucosa; Nasal Swab  Result Value Ref Range Status   MRSA by PCR Next Gen NOT DETECTED NOT DETECTED Final    Comment: (NOTE) The GeneXpert MRSA Assay (FDA approved for NASAL specimens only), is one  component of a comprehensive MRSA colonization surveillance program. It is not intended to diagnose MRSA infection nor to guide or monitor treatment for MRSA infections. Test performance is not FDA approved in patients less than 70 years old. Performed at [facility], [address] [place], [place] [address]     Coagulation Studies: No results for input(s): "LABPROT", "INR" in the last 72 hours.   Urinalysis: No results for input(s): "COLORURINE", "LABSPEC", "PHURINE", "GLUCOSEU", "HGBUR", "BILIRUBINUR", "KETONESUR", "PROTEINUR", "UROBILINOGEN", "NITRITE", "LEUKOCYTESUR" in the last 72 hours.  Invalid input(s): "APPERANCEUR"    Imaging: No results  found.      Medications:       allopurinol   100 mg Oral Daily   amLODipine   5 mg Oral BID   apixaban   2.5 mg Oral BID   Chlorhexidine  Gluconate Cloth  6 each Topical Q0600   cholecalciferol   1,000 Units Oral Daily   epoetin  alfa-epbx (RETACRIT ) injection  4,000 Units Intravenous Q T,Th,Sa-HD   furosemide   80 mg Oral Daily   isosorbide  mononitrate  60 mg Oral Daily   pantoprazole   40 mg Oral Daily   rosuvastatin   10 mg Oral QHS   spironolactone   25 mg Oral Daily   nitroGLYCERIN , senna-docusate  Assessment/ Plan:  [name] is a 73 y.o.  male with PMH of HFrEF,CAD, PVD, CVA, COPD, stage IV CKD who is admitted to [facility] on 06/21/2022 for Hyperkalemia [E87.5] AKI (acute kidney injury) (HCC) [N17.9] Acute on chronic congestive heart failure, unspecified heart failure type (HCC) [I50.9] Acute hypoxic respiratory failure (HCC) [J96.01]  Acute Kidney Injury on chronic kidney disease stage IV 2/2 to acute cardiorenal syndrome: baseline creatinine EGFR of 18.. 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 Renal ultrasound shows atrophic right kidney.  Received dialysis yesterday, UF 0.5L achieved.  Next treatment scheduled for Thursday. Awaiting outpatient dialysis clinic, [facility] has not chair available. Will attempt [facility].   Lab Results  Component Value Date   CREATININE 5.80 (H) 08/12/2023   CREATININE 4.89 (H) 08/11/2023   CREATININE 3.58 (H) 08/10/2023     Intake/Output Summary (Last 24 hours) at 08/13/2023 1115 Last data filed at 08/12/2023 1911 Gross per 24 hour  Intake 240 ml  Output --  Net 240 ml       Acute on chronic systolic and diastolic heart failure. Continue oral furosemide  80mg  and spironolactone  25 mg daily.   3. Anemia with chronic kidney disease: Hemoglobin 10.8  Will hold scheduled EPO.   4. Secondary Hyperparathyroidism: with outpatient labs: None available Lab Results  Component Value Date   PTH 124 (H)  07/13/2023   CALCIUM  9.2 08/12/2023   PHOS 3.2 08/07/2023    Calcium  and phos within optimal range.    LOS: 10 Karla Vines 5/28/202511:15 AM

## 2023-08-13 NOTE — Progress Notes (Signed)
 Mobility Specialist - Progress Note   08/13/23 1536  Mobility  Activity Ambulated independently in hallway  Level of Assistance Independent  Assistive Device None  Distance Ambulated (ft) 320 ft  Activity Response Tolerated well  Mobility visit 1 Mobility  Mobility Specialist Start Time (ACUTE ONLY) 1424  Mobility Specialist Stop Time (ACUTE ONLY) 1430  Mobility Specialist Time Calculation (min) (ACUTE ONLY) 6 min   Pt sitting EOB upon entry, utilizing RA. Pt motivated and agreeable to OOB amb this date. Pt STS, dons gown and amb two laps around the NS indep-- steady pace, tolerated well. Pt returned to the room, left seated EOB with needs within reach.  Gary Mccoy Mobility Specialist 08/13/23 3:47 PM

## 2023-08-14 DIAGNOSIS — J9621 Acute and chronic respiratory failure with hypoxia: Secondary | ICD-10-CM | POA: Diagnosis not present

## 2023-08-14 DIAGNOSIS — I509 Heart failure, unspecified: Secondary | ICD-10-CM | POA: Diagnosis not present

## 2023-08-14 DIAGNOSIS — N179 Acute kidney failure, unspecified: Secondary | ICD-10-CM | POA: Diagnosis not present

## 2023-08-14 DIAGNOSIS — J9601 Acute respiratory failure with hypoxia: Secondary | ICD-10-CM | POA: Diagnosis not present

## 2023-08-14 LAB — CBC WITH DIFFERENTIAL/PLATELET
Abs Immature Granulocytes: 0.06 10*3/uL (ref 0.00–0.07)
Basophils Absolute: 0 10*3/uL (ref 0.0–0.1)
Basophils Relative: 0 %
Eosinophils Absolute: 0.2 10*3/uL (ref 0.0–0.5)
Eosinophils Relative: 3 %
HCT: 27.5 % — ABNORMAL LOW (ref 39.0–52.0)
Hemoglobin: 9.5 g/dL — ABNORMAL LOW (ref 13.0–17.0)
Immature Granulocytes: 1 %
Lymphocytes Relative: 11 %
Lymphs Abs: 0.8 10*3/uL (ref 0.7–4.0)
MCH: 29 pg (ref 26.0–34.0)
MCHC: 34.5 g/dL (ref 30.0–36.0)
MCV: 83.8 fL (ref 80.0–100.0)
Monocytes Absolute: 1 10*3/uL (ref 0.1–1.0)
Monocytes Relative: 13 %
Neutro Abs: 5.5 10*3/uL (ref 1.7–7.7)
Neutrophils Relative %: 72 %
Platelets: 212 10*3/uL (ref 150–400)
RBC: 3.28 MIL/uL — ABNORMAL LOW (ref 4.22–5.81)
RDW: 15.1 % (ref 11.5–15.5)
WBC: 7.5 10*3/uL (ref 4.0–10.5)
nRBC: 0 % (ref 0.0–0.2)

## 2023-08-14 LAB — HEPATITIS B SURFACE ANTIGEN: Hepatitis B Surface Ag: NONREACTIVE

## 2023-08-14 LAB — RENAL FUNCTION PANEL
Albumin: 3.4 g/dL — ABNORMAL LOW (ref 3.5–5.0)
Anion gap: 11 (ref 5–15)
BUN: 47 mg/dL — ABNORMAL HIGH (ref 8–23)
CO2: 27 mmol/L (ref 22–32)
Calcium: 9.1 mg/dL (ref 8.9–10.3)
Chloride: 94 mmol/L — ABNORMAL LOW (ref 98–111)
Creatinine, Ser: 4.21 mg/dL — ABNORMAL HIGH (ref 0.61–1.24)
GFR, Estimated: 14 mL/min — ABNORMAL LOW (ref >60)
Glucose, Bld: 104 mg/dL — ABNORMAL HIGH (ref 70–99)
Phosphorus: 3.9 mg/dL (ref 2.5–4.6)
Potassium: 3.3 mmol/L — ABNORMAL LOW (ref 3.5–5.1)
Sodium: 132 mmol/L — ABNORMAL LOW (ref 135–145)

## 2023-08-14 NOTE — Plan of Care (Signed)
  Problem: Education: Goal: Knowledge of General Education information will improve Description: Including pain rating scale, medication(s)/side effects and non-pharmacologic comfort measures Outcome: Progressing   Problem: Clinical Measurements: Goal: Ability to maintain clinical measurements within normal limits will improve Outcome: Progressing Goal: Cardiovascular complication will be avoided Outcome: Progressing   Problem: Elimination: Goal: Will not experience complications related to bowel motility Outcome: Progressing

## 2023-08-14 NOTE — Progress Notes (Signed)
 Contacted by staff regarding needed f/u regarding pt's approval for out-pt HD. Contacted [facility] admissions and was advised that pt has been accepted at [facility] on [address] on MWF 10:45 am chair time. Pt can start on Monday, June 2 and will need to arrive at 10:15 am for paperwork prior to treatment. Contacted [facility] directly to conform this info is correct. Clinic confirms above info is correct. Update provided to attending, renal NP, and renal staff. Requested that staff please advise pt of above information and HD arrangements placed on pt's AVS as well.   Lauraine Polite Renal Navigator  [phone]

## 2023-08-14 NOTE — Progress Notes (Signed)
 PROGRESS NOTE    Gary Mccoy.  ZOX:[id number] DOB: 1950/08/15 DOA: 08/03/2023 PCP: Sari Cunning, MD  Chief Complaint  Patient presents with   Shortness of Breath    Hospital Course:  Gary Mccoy. is a 73 year old male with hide for heart failure, COPD, hypertension, hyperlipidemia, history of CVA, dilated cardiomyopathy, history of CAD status post PCI with DES in 2023, PAD, on anticoagulation, with known CKD stage IV who presented to the ED with shortness of breath.  Patient was found to be acutely volume overloaded.  He was started on IV diuresis.  Ultimately his CKD progressed and he was declared ESRD.  He underwent PermCath placement with vascular surgery on 5/20.  He began receiving hemodialysis without issue.  Stay was prolonged pending outpatient hemodialysis arrangements.  Subjective: No acute events overnight.  Patient was seen while receiving dialysis today.  He is complaining of sciatic nerve pain.  He is anxious to discharge home from the hospital and return to his own living environment and bed   Objective: Vitals:   08/14/23 1216 08/14/23 1225 08/14/23 1411 08/14/23 1532  BP: (!) 180/81  (!) 162/68 (!) 152/71  Pulse: 70  69 60  Resp: 17  18 18   Temp: (!) 97.2 F (36.2 C)  98.5 F (36.9 C) 98.2 F (36.8 C)  TempSrc:   Oral   SpO2: 97%  100% 100%  Weight:  70.7 kg    Height:       No intake or output data in the 24 hours ending 08/14/23 1704  Filed Weights   08/14/23 0500 08/14/23 0823 08/14/23 1225  Weight: 71 kg 70.4 kg 70.7 kg    Examination: General exam: Appears calm and comfortable, NAD  Respiratory system: No work of breathing, symmetric chest wall expansion Cardiovascular system: S1 & S2 heard, RRR.  Gastrointestinal system: Abdomen is nondistended, soft and nontender.  Neuro: Alert and oriented. No focal neurological deficits. Extremities: Symmetric, expected ROM Skin: No rashes, lesions Psychiatry: Demonstrates appropriate  judgement and insight. Mood & affect appropriate for situation.   Assessment & Plan:  Principal Problem:   Acute hypoxic respiratory failure (HCC) Active Problems:   Acute on chronic respiratory failure with hypoxia (HCC)   Acute renal failure superimposed on stage 4 chronic kidney disease (HCC)   PAD s/p aortobifemoral bypass 2017 (peripheral artery disease)   Benign essential HTN   Leukocytosis   Hyperlipidemia   CVA (cerebral vascular accident) (HCC)   Acute exacerbation of CHF (congestive heart failure) (HCC)   CKD (chronic kidney disease) stage 4, GFR 15-29 ml/min (HCC)   CAD S/P percutaneous coronary angioplasty   Encounter for dialysis and dialysis catheter care [facility])   Acute renal failure superimposed on stage IV CKD - Patient has now been declared ESRD - PermCath placement with vascular surgery 5/20 - Nephrology consulted.  HD per nephro. - Currently tolerating hemodialysis well.  Outpatient hemodialysis arrangements have been secured. - Patient to start at [facility] Monday Wednesday Friday, first session June 2. - Awaiting case management confirmation that insurance authorization has been achieved - Patient to receive short course hemodialysis tomorrow morning and next session will be June 2  Acute hypoxic respiratory failure, resolved - Secondary to heart failure exacerbation.  Initially required BiPAP - Now stable on room air  Heart failure preserved EF Echo 07/13/2023.  LVEF 45 to 50%, LV with global hypokinesis.  Grade 1 diastolic dysfunction. - Euvolemic now - Currently managed with dialysis - Did require  Lasix  drip earlier this admission. - Currently on Lasix  and spironolactone .  Will continue.  Hyperlipidemia - Continue statin  Hypertension - Currently on Lasix , Imdur , amlodipine , spironolactone   - Hydralazine  and clonidine  currently on hold. - Monitor blood pressures closely, titrate medications as needed - Improvement with dialysis  Secondary  hyperparathyroidism - Management per renal.  Hypokalemia Hyponatremia - Address as needed with dialysis  Debility - Patient reports he does not need physical therapy.  Has declined outpatient PT offers  DVT prophylaxis: Eliquis    Code Status: Full Code Disposition: Medically ready for discharge.  Pending outpatient hemodialysis arrangements.  TOC aware.  Consultants:  Treatment Team:  Consulting Physician: Worthy Heads, MD Consulting Physician: Jackquelyn Mass, MD  Procedures:  PermaCath placement  Antimicrobials:  Anti-infectives (From admission, onward)    Start     Dose/Rate Route Frequency Ordered Stop   08/05/23 0900  ceFAZolin  (ANCEF ) IVPB 1 g/50 mL premix        1 g 100 mL/hr over 30 Minutes Intravenous 30 min pre-op 08/05/23 0444 08/05/23 1442   08/05/23 0600  ceFAZolin  (ANCEF ) IVPB 1 g/50 mL premix  Status:  Discontinued        1 g 100 mL/hr over 30 Minutes Intravenous 30 min pre-op 08/05/23 0318 08/05/23 0444       Data Reviewed: I have personally reviewed following labs and imaging studies CBC: Recent Labs  Lab 08/09/23 0802 08/12/23 0506 08/14/23 0921  WBC 8.1 9.6 7.5  NEUTROABS  --   --  5.5  HGB 9.9* 10.8* 9.5*  HCT 30.1* 32.4* 27.5*  MCV 85.8 84.8 83.8  PLT 197 219 212   Basic Metabolic Panel: Recent Labs  Lab 08/09/23 0451 08/10/23 0545 08/11/23 0508 08/12/23 0506 08/14/23 0921  NA 130* 130* 126* 129* 132*  K 3.5 3.3* 3.6 4.2 3.3*  CL 94* 90* 88* 91* 94*  CO2 25 30 22 28 27   GLUCOSE 113* 120* 86 104* 104*  BUN 50* 30* 47* 65* 47*  CREATININE 3.70* 3.58* 4.89* 5.80* 4.21*  CALCIUM  8.6* 9.0 9.0 9.2 9.1  PHOS  --   --   --   --  3.9   GFR: Estimated Creatinine Clearance: 16.1 mL/min (A) (by C-G formula based on SCr of 4.21 mg/dL (H)). Liver Function Tests: Recent Labs  Lab 08/14/23 0921  ALBUMIN 3.4*   CBG: No results for input(s): "GLUCAP" in the last 168 hours.  No results found for this or any previous visit (from  the past 240 hours).    Radiology Studies: No results found.  Scheduled Meds:  allopurinol   100 mg Oral Daily   amLODipine   5 mg Oral BID   apixaban   2.5 mg Oral BID   Chlorhexidine  Gluconate Cloth  6 each Topical Q0600   cholecalciferol   1,000 Units Oral Daily   furosemide   80 mg Oral Daily   isosorbide  mononitrate  60 mg Oral Daily   pantoprazole   40 mg Oral Daily   rosuvastatin   10 mg Oral QHS   spironolactone   25 mg Oral Daily   Continuous Infusions:   LOS: 11 days  MDM: Patient is high risk for one or more organ failure.  They necessitate ongoing hospitalization for continued IV therapies and subsequent lab monitoring. Total time spent interpreting labs and vitals, reviewing the medical record, coordinating care amongst consultants and care team members, directly assessing and discussing care with the patient and/or family: 55 min  Tanea Moga, DO [facility]  To contact the  attending physician between 7A-7P please use Epic Chat. To contact the covering physician during after hours 7P-7A, please review Amion.  08/14/2023, 5:04 PM   *This document has been created with the assistance of dictation software. Please excuse typographical errors. *

## 2023-08-14 NOTE — TOC Progression Note (Signed)
 Transition of Care (TOC) - Progression Note    Patient Details  Name: Gary Mccoy. MRN: [id number] Date of Birth: 13-Jul-1950  Transition of Care [facility]) CM/SW Contact  Elsie Halo, RN Phone Number: 08/14/2023, 1:02 PM  Clinical Narrative:    TOC reached out to Nephrology Nurse Practitioner to get an update on dialysis chair. [name] first choice for outpatient dialysis is [facility], which took a while to share that they don't have any chairs available. He was sent to [facility] yesterday and was accepted by Dr Rhesa Celeste. Now we are awaiting financial clearance, which hopefully will come later today or tomorrow.    Expected Discharge Plan: OP Rehab Barriers to Discharge: Continued Medical Work up  Expected Discharge Plan and Services       Living arrangements for the past 2 months: Single Family Home                                       Social Determinants of Health (SDOH) Interventions SDOH Screenings   Food Insecurity: No Food Insecurity (08/03/2023)  Housing: Low Risk  (08/03/2023)  Transportation Needs: No Transportation Needs (08/03/2023)  Utilities: Not At Risk (08/03/2023)  Depression (PHQ2-9): Low Risk  (11/23/2021)  Financial Resource Strain: Low Risk  (02/20/2023)   Received from [facility]  Social Connections: Moderately Integrated (08/03/2023)  Tobacco Use: High Risk (08/03/2023)    Readmission Risk Interventions    08/11/2023   10:19 AM 07/14/2023    1:03 PM  Readmission Risk Prevention Plan  Transportation Screening Complete Complete  PCP or Specialist Appt within 3-5 Days Complete Complete  Social Work Consult for Recovery Care Planning/Counseling Complete Complete  Palliative Care Screening Not Applicable Not Applicable  Medication Review Oceanographer) Complete Complete

## 2023-08-14 NOTE — Progress Notes (Signed)
 [facility]  ROUNDING NOTE   Subjective:  Gary Mccoy. was admitted to [facility] on 08/03/2023 with recurrent shortness of breath and weight gain.  Patient is followed by Dr Zelda Hickman outpatient.   Patient well-known to us  from last hospitalization where he was significantly fluid overloaded and initially started on dialysis. He did have some renal recovery though his urine output was a bit lower than expected close to his discharge.  He was seen back in the office on 07/28/2023. Since then he has gained additional weight. Upon readmission patient restarted on Lasix  drip.  Update:   Patient seen and evaluated during dialysis   HEMODIALYSIS FLOWSHEET:  Blood Flow Rate (mL/min): 399 mL/min Arterial Pressure (mmHg): -181.81 mmHg Venous Pressure (mmHg): 153.32 mmHg TMP (mmHg): 7.88 mmHg Ultrafiltration Rate (mL/min): 686 mL/min Dialysate Flow Rate (mL/min): 299 ml/min Dialysis Fluid Bolus: Normal Saline  Denies pain or discomfort Room air   Objective:  Vital signs in last 24 hours:  Temp:  [97.5 F (36.4 C)-98.3 F (36.8 C)] 97.8 F (36.6 C) (05/29 0823) Pulse Rate:  [59-73] 72 (05/29 1030) Resp:  [14-21] 20 (05/29 1030) BP: (125-186)/(70-94) 162/80 (05/29 1030) SpO2:  [93 %-100 %] 96 % (05/29 1030) Weight:  [70.4 kg-71 kg] 70.4 kg (05/29 0823)  Weight change: 0.4 kg Filed Weights   08/12/23 1100 08/14/23 0500 08/14/23 0823  Weight: 70 kg 71 kg 70.4 kg    Intake/Output: I/O last 3 completed shifts: In: 240 [P.O.:240] Out: -    Intake/Output this shift:  No intake/output data recorded.  Physical Exam: General: No acute distress.  Head: Normocephalic, atraumatic. Moist oral mucosal membranes  Eyes: Anicteric  Lungs:  Diminished, normal effort  Heart: Regular rate and rhythm  Abdomen:  Soft, nontender  Extremities: No peripheral edema.  Neurologic: Alert, moving all four extremities  Skin: No lesions  Access: Rt internal jugular permcath     Basic Metabolic Panel: Recent Labs  Lab 08/09/23 0451 08/10/23 0545 08/11/23 0508 08/12/23 0506 08/14/23 0921  NA 130* 130* 126* 129* 132*  K 3.5 3.3* 3.6 4.2 3.3*  CL 94* 90* 88* 91* 94*  CO2 25 30 22 28 27   GLUCOSE 113* 120* 86 104* 104*  BUN 50* 30* 47* 65* 47*  CREATININE 3.70* 3.58* 4.89* 5.80* 4.21*  CALCIUM  8.6* 9.0 9.0 9.2 9.1  PHOS  --   --   --   --  3.9    Liver Function Tests: Recent Labs  Lab 08/14/23 0921  ALBUMIN 3.4*    No results for input(s): "LIPASE", "AMYLASE" in the last 168 hours. No results for input(s): "AMMONIA" in the last 168 hours.  CBC: Recent Labs  Lab 08/07/23 1609 08/09/23 0802 08/12/23 0506 08/14/23 0921  WBC 6.8 8.1 9.6 7.5  NEUTROABS  --   --   --  5.5  HGB 9.3* 9.9* 10.8* 9.5*  HCT 27.5* 30.1* 32.4* 27.5*  MCV 85.1 85.8 84.8 83.8  PLT 181 197 219 212    Cardiac Enzymes: No results for input(s): "CKTOTAL", "CKMB", "CKMBINDEX", "TROPONINI" in the last 168 hours.  BNP: Invalid input(s): "POCBNP"  CBG: No results for input(s): "GLUCAP" in the last 168 hours.    Microbiology: Results for orders placed or performed during the hospital encounter of 08/03/23  MRSA Next Gen by PCR, Nasal     Status: None   Collection Time: 08/03/23  4:52 PM   Specimen: Nasal Mucosa; Nasal Swab  Result Value Ref Range Status  MRSA by PCR Next Gen NOT DETECTED NOT DETECTED Final    Comment: (NOTE) The GeneXpert MRSA Assay (FDA approved for NASAL specimens only), is one component of a comprehensive MRSA colonization surveillance program. It is not intended to diagnose MRSA infection nor to guide or monitor treatment for MRSA infections. Test performance is not FDA approved in patients less than 90 years old. Performed at [facility], [address] [place], [place] [address]     Coagulation Studies: No results for input(s): "LABPROT", "INR" in the last 72 hours.   Urinalysis: No results for input(s): "COLORURINE",  "LABSPEC", "PHURINE", "GLUCOSEU", "HGBUR", "BILIRUBINUR", "KETONESUR", "PROTEINUR", "UROBILINOGEN", "NITRITE", "LEUKOCYTESUR" in the last 72 hours.  Invalid input(s): "APPERANCEUR"    Imaging: No results found.      Medications:       allopurinol   100 mg Oral Daily   amLODipine   5 mg Oral BID   apixaban   2.5 mg Oral BID   Chlorhexidine  Gluconate Cloth  6 each Topical Q0600   cholecalciferol   1,000 Units Oral Daily   furosemide   80 mg Oral Daily   isosorbide  mononitrate  60 mg Oral Daily   pantoprazole   40 mg Oral Daily   rosuvastatin   10 mg Oral QHS   spironolactone  25 mg Oral Daily   nitroGLYCERIN , senna-docusate  Assessment/ Plan:  Gary. Canio Mccoy. is a 73 y.o.  male with PMH of HFrEF,CAD, PVD, CVA, COPD, stage IV CKD who is admitted to [facility] on 06/21/2022 for Hyperkalemia [E87.5] AKI (acute kidney injury) (HCC) [N17.9] Acute on chronic congestive heart failure, unspecified heart failure type (HCC) [I50.9] Acute hypoxic respiratory failure (HCC) [J96.01]  End stage renal disease requiring hemodialysis.  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 Renal ultrasound shows atrophic right kidney. Due to previous failed dialysis wean during previous admission and lack of meaningful recovery, we feel this patient is considered end stage renal disease.  Receiving dialysis today, UF goal 1-1.5L as tolerated. Next treatment scheduled for Friday. Awaiting outpatient dialysis clinic acceptance.   Lab Results  Component Value Date   CREATININE 4.21 (H) 08/14/2023   CREATININE 5.80 (H) 08/12/2023   CREATININE 4.89 (H) 08/11/2023     Intake/Output Summary (Last 24 hours) at 08/14/2023 1111 Last data filed at 08/13/2023 1413 Gross per 24 hour  Intake 0 ml  Output --  Net 0 ml       Acute on chronic systolic and diastolic heart failure. Continue oral furosemide  80mg  and spironolactone 25 mg daily.   3. Anemia with chronic kidney disease: Hemoglobin 9.5.   Will continue to assess need for esa.    4. Secondary Hyperparathyroidism: with outpatient labs: None available Lab Results  Component Value Date   PTH 124 (H) 07/13/2023   CALCIUM  9.1 08/14/2023   PHOS 3.9 08/14/2023    Bone minerals within optimal range. Will continue to monitor.    LOS: 11 Theda Payer 5/29/202511:11 AM

## 2023-08-15 LAB — CBC
HCT: 32.3 % — ABNORMAL LOW (ref 39.0–52.0)
Hemoglobin: 10.5 g/dL — ABNORMAL LOW (ref 13.0–17.0)
MCH: 28 pg (ref 26.0–34.0)
MCHC: 32.5 g/dL (ref 30.0–36.0)
MCV: 86.1 fL (ref 80.0–100.0)
Platelets: 234 10*3/uL (ref 150–400)
RBC: 3.75 MIL/uL — ABNORMAL LOW (ref 4.22–5.81)
RDW: 15.2 % (ref 11.5–15.5)
WBC: 7.5 10*3/uL (ref 4.0–10.5)
nRBC: 0 % (ref 0.0–0.2)

## 2023-08-15 LAB — RENAL FUNCTION PANEL
Albumin: 3.6 g/dL (ref 3.5–5.0)
Anion gap: 12 (ref 5–15)
BUN: 26 mg/dL — ABNORMAL HIGH (ref 8–23)
CO2: 27 mmol/L (ref 22–32)
Calcium: 9.3 mg/dL (ref 8.9–10.3)
Chloride: 93 mmol/L — ABNORMAL LOW (ref 98–111)
Creatinine, Ser: 3.16 mg/dL — ABNORMAL HIGH (ref 0.61–1.24)
GFR, Estimated: 20 mL/min — ABNORMAL LOW (ref >60)
Glucose, Bld: 92 mg/dL (ref 70–99)
Phosphorus: 3.5 mg/dL (ref 2.5–4.6)
Potassium: 3.4 mmol/L — ABNORMAL LOW (ref 3.5–5.1)
Sodium: 132 mmol/L — ABNORMAL LOW (ref 135–145)

## 2023-08-15 MED ORDER — FUROSEMIDE 80 MG PO TABS: 80.0000 mg | ORAL_TABLET | Freq: Every day | ORAL | 0 refills | Status: DC

## 2023-08-15 MED ORDER — AMLODIPINE BESYLATE 5 MG PO TABS: 10.0000 mg | ORAL_TABLET | Freq: Every day | ORAL | 0 refills | Status: AC

## 2023-08-15 MED ORDER — SPIRONOLACTONE 25 MG PO TABS: 25.0000 mg | ORAL_TABLET | Freq: Every day | ORAL | 0 refills | Status: DC

## 2023-08-15 MED ORDER — HYDRALAZINE HCL 100 MG PO TABS: 50.0000 mg | ORAL_TABLET | Freq: Three times a day (TID) | ORAL | 0 refills | Status: AC

## 2023-08-15 MED ORDER — ISOSORBIDE MONONITRATE ER 60 MG PO TB24: 60.0000 mg | ORAL_TABLET | Freq: Every day | ORAL | 0 refills | Status: AC

## 2023-08-15 NOTE — Progress Notes (Signed)
 Hemodialysis Note:  Received patient in bed to unit. Alert and oriented. Informed consent singed and in chart.  Treatment initiated: 0740 Treatment completed: 1018  Access used: Right internal jugular catheter Access issues: None  Patient tolerated well. Transported back to room, alert without acute distress. Report given to patient's RN.  Total UF removed: 1 Liter Medications given: None  Post HD weight: 69.5 Kg  [facility]

## 2023-08-15 NOTE — Plan of Care (Signed)
  Problem: Health Behavior/Discharge Planning: Goal: Ability to manage health-related needs will improve Outcome: Progressing   Problem: Clinical Measurements: Goal: Cardiovascular complication will be avoided Outcome: Progressing   Problem: Elimination: Goal: Will not experience complications related to bowel motility Outcome: Progressing   Problem: Pain Managment: Goal: General experience of comfort will improve and/or be controlled Outcome: Progressing

## 2023-08-15 NOTE — Progress Notes (Signed)
 Late Note Entry- Aug 15, 2023  Pt d/c today. Contacted [facility] to advise staff of pt's d/c today and that pt should start on Monday as planned.   Lauraine Polite Renal Navigator [phone]

## 2023-08-15 NOTE — Progress Notes (Signed)
 [facility]  ROUNDING NOTE   Subjective:  Gary Mccoy. was admitted to [facility] on 08/03/2023 with recurrent shortness of breath and weight gain.  Patient is followed by Dr Zelda Hickman outpatient.   Patient well-known to us  from last hospitalization where he was significantly fluid overloaded and initially started on dialysis. He did have some renal recovery though his urine output was a bit lower than expected close to his discharge.  He was seen back in the office on 07/28/2023. Since then he has gained additional weight. Upon readmission patient restarted on Lasix  drip.  Update:   Patient seen and evaluated during dialysis   HEMODIALYSIS FLOWSHEET:  Blood Flow Rate (mL/min): 300 mL/min Arterial Pressure (mmHg): -146.66 mmHg Venous Pressure (mmHg): 115.95 mmHg TMP (mmHg): 18.99 mmHg Ultrafiltration Rate (mL/min): 846 mL/min Dialysate Flow Rate (mL/min): 299 ml/min Dialysis Fluid Bolus: Normal Saline  Room air No lower extremity edema   Objective:  Vital signs in last 24 hours:  Temp:  [97.2 F (36.2 C)-98.5 F (36.9 C)] 98 F (36.7 C) (05/30 1018) Pulse Rate:  [56-74] 74 (05/30 1018) Resp:  [13-20] 19 (05/30 1018) BP: (124-182)/(68-85) 182/85 (05/30 1018) SpO2:  [97 %-100 %] 100 % (05/30 1018) Weight:  [69.5 kg-71.2 kg] 69.5 kg (05/30 1018)  Weight change: -0.6 kg Filed Weights   08/14/23 1225 08/15/23 0729 08/15/23 1018  Weight: 70.7 kg 71.2 kg 69.5 kg    Intake/Output: No intake/output data recorded.   Intake/Output this shift:  Total I/O In: -  Out: 1000 [Other:1000]  Physical Exam: General: No acute distress.  Head: Normocephalic, atraumatic. Moist oral mucosal membranes  Eyes: Anicteric  Lungs:  Diminished, normal effort  Heart: Regular rate and rhythm  Abdomen:  Soft, nontender  Extremities: No peripheral edema.  Neurologic: Alert, moving all four extremities  Skin: No lesions  Access: Rt internal jugular permcath    Basic  Metabolic Panel: Recent Labs  Lab 08/10/23 0545 08/11/23 0508 08/12/23 0506 08/14/23 0921 08/15/23 0809  NA 130* 126* 129* 132* 132*  K 3.3* 3.6 4.2 3.3* 3.4*  CL 90* 88* 91* 94* 93*  CO2 30 22 28 27 27   GLUCOSE 120* 86 104* 104* 92  BUN 30* 47* 65* 47* 26*  CREATININE 3.58* 4.89* 5.80* 4.21* 3.16*  CALCIUM  9.0 9.0 9.2 9.1 9.3  PHOS  --   --   --  3.9 3.5    Liver Function Tests: Recent Labs  Lab 08/14/23 0921 08/15/23 0809  ALBUMIN 3.4* 3.6    No results for input(s): "LIPASE", "AMYLASE" in the last 168 hours. No results for input(s): "AMMONIA" in the last 168 hours.  CBC: Recent Labs  Lab 08/09/23 0802 08/12/23 0506 08/14/23 0921 08/15/23 0809  WBC 8.1 9.6 7.5 7.5  NEUTROABS  --   --  5.5  --   HGB 9.9* 10.8* 9.5* 10.5*  HCT 30.1* 32.4* 27.5* 32.3*  MCV 85.8 84.8 83.8 86.1  PLT 197 219 212 234    Cardiac Enzymes: No results for input(s): "CKTOTAL", "CKMB", "CKMBINDEX", "TROPONINI" in the last 168 hours.  BNP: Invalid input(s): "POCBNP"  CBG: No results for input(s): "GLUCAP" in the last 168 hours.    Microbiology: Results for orders placed or performed during the hospital encounter of 08/03/23  MRSA Next Gen by PCR, Nasal     Status: None   Collection Time: 08/03/23  4:52 PM   Specimen: Nasal Mucosa; Nasal Swab  Result Value Ref Range Status   MRSA by  PCR Next Gen NOT DETECTED NOT DETECTED Final    Comment: (NOTE) The GeneXpert MRSA Assay (FDA approved for NASAL specimens only), is one component of a comprehensive MRSA colonization surveillance program. It is not intended to diagnose MRSA infection nor to guide or monitor treatment for MRSA infections. Test performance is not FDA approved in patients less than 47 years old. Performed at [facility], [address] [place], [place] [address]     Coagulation Studies: No results for input(s): "LABPROT", "INR" in the last 72 hours.   Urinalysis: No results for input(s):  "COLORURINE", "LABSPEC", "PHURINE", "GLUCOSEU", "HGBUR", "BILIRUBINUR", "KETONESUR", "PROTEINUR", "UROBILINOGEN", "NITRITE", "LEUKOCYTESUR" in the last 72 hours.  Invalid input(s): "APPERANCEUR"    Imaging: No results found.      Medications:       allopurinol   100 mg Oral Daily   amLODipine   5 mg Oral BID   apixaban   2.5 mg Oral BID   Chlorhexidine  Gluconate Cloth  6 each Topical Q0600   cholecalciferol   1,000 Units Oral Daily   furosemide   80 mg Oral Daily   isosorbide  mononitrate  60 mg Oral Daily   pantoprazole   40 mg Oral Daily   rosuvastatin   10 mg Oral QHS   spironolactone  25 mg Oral Daily   nitroGLYCERIN , senna-docusate  Assessment/ Plan:  Gary Mccoy. is a 73 y.o.  male with PMH of HFrEF,CAD, PVD, CVA, COPD, stage IV CKD who is admitted to [facility] on 06/21/2022 for Hyperkalemia [E87.5] AKI (acute kidney injury) (HCC) [N17.9] Acute on chronic congestive heart failure, unspecified heart failure type (HCC) [I50.9] Acute hypoxic respiratory failure (HCC) [J96.01]  End stage renal disease requiring hemodialysis.  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 Renal ultrasound shows atrophic right kidney. Due to previous failed dialysis wean during previous admission and lack of meaningful recovery, we feel this patient is considered end stage renal disease.  Patient has been accepted to [facility], MWF at 10:45. Can start outpatient treatments on Monday. Patient was agreeable to short treatment today. If stable after dialysis, patient cleared to discharge.   Lab Results  Component Value Date   CREATININE 3.16 (H) 08/15/2023   CREATININE 4.21 (H) 08/14/2023   CREATININE 5.80 (H) 08/12/2023     Intake/Output Summary (Last 24 hours) at 08/15/2023 1055 Last data filed at 08/15/2023 1018 Gross per 24 hour  Intake --  Output 1000 ml  Net -1000 ml       Acute on chronic systolic and diastolic heart failure. Continue oral furosemide  80mg   on nondialysis days only. Can stop spironolactone and will manage potassium with dialysis  3. Anemia with chronic kidney disease: Hemoglobin acceptable, 10.5. Will continue to monitor for now.    4. Secondary Hyperparathyroidism: with outpatient labs: None available Lab Results  Component Value Date   PTH 124 (H) 07/13/2023   CALCIUM  9.3 08/15/2023   PHOS 3.5 08/15/2023    Calcium  and phos within desired range.    LOS: 12 Jaydn Fincher 5/30/202510:55 AM

## 2023-08-15 NOTE — Discharge Summary (Signed)
 Physician Discharge Summary   Patient: Gary Mccoy. MRN: [id number] DOB: 1950-08-18  Admit date:     08/03/2023  Discharge date: 08/15/23  Discharge Physician: Roise Cleaver   PCP: Sari Cunning, MD   Recommendations at discharge:   Start outpatient hemodialysis as scheduled 08/18/2023 Follow-up with primary care for continued blood pressure management  Discharge Diagnoses: Principal Problem:   Acute hypoxic respiratory failure [facility]) Active Problems:   Acute on chronic respiratory failure with hypoxia (HCC)   Acute renal failure superimposed on stage 4 chronic kidney disease (HCC)   PAD s/p aortobifemoral bypass 2017 (peripheral artery disease)   Benign essential HTN   Leukocytosis   Hyperlipidemia   CVA (cerebral vascular accident) (HCC)   Acute exacerbation of CHF (congestive heart failure) (HCC)   CKD (chronic kidney disease) stage 4, GFR 15-29 ml/min (HCC)   CAD S/P percutaneous coronary angioplasty   Encounter for dialysis and dialysis catheter care [facility])  Resolved Problems:   * No resolved hospital problems. *  Hospital Course: Gary A Lazarius Rivkin. is a 73 year old male with hide for heart failure, COPD, hypertension, hyperlipidemia, history of CVA, dilated cardiomyopathy, history of CAD status post PCI with DES in 2023, PAD, on anticoagulation, with known CKD stage IV who presented to the ED with shortness of breath. Patient was found to be acutely volume overloaded. He was started on IV diuresis. Ultimately his CKD progressed and he was declared ESRD. He underwent PermaCath placement with vascular surgery on 5/20. He began receiving hemodialysis without issue. Stay was prolonged pending outpatient hemodialysis arrangements.  Ultimately patient was found hemodialysis chair at [facility] with first session to begin on 6/2.  He received additional session of hemodialysis on 5/30 prior to discharge.  New medications were reviewed with him prior to DC.  Acute  renal failure superimposed on stage IV CKD - Patient has now been declared ESRD - PermCath placement with vascular surgery 5/20 - Nephrology consulted.  HD per nephro. - Patient to start at [facility] Monday Wednesday Friday, first session June 2   Acute hypoxic respiratory failure, resolved - Secondary to heart failure exacerbation.  Initially required BiPAP - Now stable on room air   Anemia secondary to chronic kidney disease - Continue to monitor hemoglobin outpatient.  Currently stable  Heart failure preserved EF Echo 07/13/2023.  LVEF 45 to 50%, LV with global hypokinesis.  Grade 1 diastolic dysfunction. - Euvolemic now - Currently managed with dialysis - Discontinue spironolactone.  Continue with 80mg  Lasix  on nondialysis days.  Reviewed with patient.   Hyperlipidemia - Continue statin   Hypertension - Currently on Lasix , Imdur , amlodipine , hydralazine  - Lower antihypertensive needs since initiation of dialysis - Continue to keep a blood pressure log, follow-up with PCP for further medication titration   Secondary hyperparathyroidism - Management per renal.   Hypokalemia Hyponatremia - Address as needed with dialysis   Debility - Patient reports he does not need physical therapy.  Has declined outpatient PT offers   Consultants: Nephrology, vascular surgery  Procedures performed: permacath placed 5/20  Disposition: Home Diet recommendation:  Discharge Diet Orders (From admission, onward)     Start     Ordered   08/15/23 0000  Diet general        08/15/23 1130           Renal diet DISCHARGE MEDICATION: Allergies as of 08/15/2023   No Known Allergies      Medication List     PAUSE taking  these medications    cloNIDine  0.1 MG tablet Wait to take this until your doctor or other care provider tells you to start again. Commonly known as: CATAPRES  Take 0.1 mg by mouth 2 (two) times daily.       STOP taking these medications    carvedilol   25 MG tablet Commonly known as: COREG    CoQ10 200 MG Caps   cyanocobalamin  1000 MCG tablet Commonly known as: VITAMIN B12   isosorbide  dinitrate 40 MG tablet Commonly known as: ISORDIL    magnesium  oxide 400 MG tablet Commonly known as: MAG-OX   MULTIVITAMIN ADULT PO   potassium chloride  SA 20 MEQ tablet Commonly known as: KLOR-CON  M   Vitron-C 65-125 MG Tabs Generic drug: Iron-Vitamin C       TAKE these medications    acetaminophen  500 MG tablet Commonly known as: TYLENOL  Take 500 mg by mouth every 6 (six) hours as needed.   albuterol  108 (90 Base) MCG/ACT inhaler Commonly known as: VENTOLIN  HFA Inhale 2 puffs into the lungs every 6 (six) hours as needed for wheezing or shortness of breath.   allopurinol  100 MG tablet Commonly known as: ZYLOPRIM  Take 100 mg by mouth daily.   amLODipine  5 MG tablet Commonly known as: NORVASC  Take 2 tablets (10 mg total) by mouth daily. What changed:  how much to take when to take this   apixaban  2.5 MG Tabs tablet Commonly known as: ELIQUIS  Take 2.5 mg by mouth 2 (two) times daily.   FISH OIL PO Take by mouth.   furosemide  80 MG tablet Commonly known as: LASIX  Take 1 tablet (80 mg total) by mouth daily. Take on days you do not receive dialysis   hydrALAZINE  100 MG tablet Commonly known as: APRESOLINE  Take 0.5 tablets (50 mg total) by mouth 3 (three) times daily. What changed: how much to take   isosorbide  mononitrate 60 MG 24 hr tablet Commonly known as: IMDUR  Take 1 tablet (60 mg total) by mouth daily.   nitroGLYCERIN  0.4 MG SL tablet Commonly known as: NITROSTAT  Place 1 tablet (0.4 mg total) under the tongue every 5 (five) minutes x 3 doses as needed for chest pain.   pantoprazole  40 MG tablet Commonly known as: PROTONIX  Take 40 mg by mouth daily.   rosuvastatin  40 MG tablet Commonly known as: CRESTOR  Take 20 mg by mouth daily.   Vitamin D -1000 Max St 25 MCG (1000 UT) tablet Generic drug:  Cholecalciferol  Take 1,000 Units by mouth daily.        Follow-up Information     Sari Cunning, MD Follow up.   Specialty: Internal Medicine Why: Hospital follow up Contact information: [address] [facility] [place] [address] [phone]         Schnier, Ninette Basque, MD Follow up in 2 week(s).   Specialties: Vascular Surgery, Cardiology, Radiology, Vascular Surgery Why: follow up after procedure with vein mapping of upper extremities Contact information: [address] [place] [address] [phone]         Dialysis, [facility]. Go on 08/18/2023.   Why: Schedule is Monday, Wednesday, Friday with 10:45 am chair time.  On Monday (6/2), please arrive at 10:15 am to complete paperwork prior to treatment. Contact information: [address] [place] [address] [phone]                Discharge Exam: Filed Weights   08/14/23 1225 08/15/23 0729 08/15/23 1018  Weight: 70.7 kg 71.2 kg  69.5 kg   Constitutional:  Normal appearance. Non toxic-appearing.  HENT: Head Normocephalic and atraumatic.  Mucous membranes are moist.  Eyes:  Extraocular intact. Conjunctivae normal. Pupils are equal, round, and reactive to light.  Cardiovascular: Rate and Rhythm: Normal rate and regular rhythm.  Pulmonary: Non labored, symmetric rise of chest wall.  Musculoskeletal:  Normal range of motion.  Skin: warm and dry. not jaundiced.  Neurological: No focal deficit present. alert. Oriented. Psychiatric: Mood and Affect congruent.   Discharge Instructions     Call MD for:  difficulty breathing, headache or visual disturbances   Complete by: As directed    Call MD for:  persistant dizziness or light-headedness   Complete by: As directed    Call MD for:  persistant nausea and vomiting   Complete by: As directed    Call MD for:  severe uncontrolled pain   Complete by: As directed    Call MD for:  temperature  >100.4   Complete by: As directed    Diet general   Complete by: As directed    Discharge instructions   Complete by: As directed    Follow up with your primary care physician to discuss the medication changes during this admission.  Start hemodialysis Monday June 2 at your scheduled time.   Increase activity slowly   Complete by: As directed         Condition at discharge: stable  The results of significant diagnostics from this hospitalization (including imaging, microbiology, ancillary and laboratory) are listed below for reference.   Imaging Studies: PERIPHERAL VASCULAR CATHETERIZATION Result Date: 08/05/2023 See surgical note for result.  DG Chest Portable 1 View Result Date: 08/03/2023 CLINICAL DATA:  Dyspnea. EXAM: PORTABLE CHEST 1 VIEW COMPARISON:  July 11, 2023. FINDINGS: Stable cardiomediastinal silhouette. Both lungs are clear. The visualized skeletal structures are unremarkable. IMPRESSION: No active disease. Electronically Signed   By: Rosalene Colon M.D.   On: 08/03/2023 14:39   PERIPHERAL VASCULAR CATHETERIZATION Result Date: 07/16/2023 See surgical note for result.   Microbiology: Results for orders placed or performed during the hospital encounter of 08/03/23  MRSA Next Gen by PCR, Nasal     Status: None   Collection Time: 08/03/23  4:52 PM   Specimen: Nasal Mucosa; Nasal Swab  Result Value Ref Range Status   MRSA by PCR Next Gen NOT DETECTED NOT DETECTED Final    Comment: (NOTE) The GeneXpert MRSA Assay (FDA approved for NASAL specimens only), is one component of a comprehensive MRSA colonization surveillance program. It is not intended to diagnose MRSA infection nor to guide or monitor treatment for MRSA infections. Test performance is not FDA approved in patients less than 59 years old. Performed at [facility], [address] [place], [place] [address]     Labs: CBC: Recent Labs  Lab 08/09/23 0802 08/12/23 0506 08/14/23 0921  08/15/23 0809  WBC 8.1 9.6 7.5 7.5  NEUTROABS  --   --  5.5  --   HGB 9.9* 10.8* 9.5* 10.5*  HCT 30.1* 32.4* 27.5* 32.3*  MCV 85.8 84.8 83.8 86.1  PLT 197 219 212 234   Basic Metabolic Panel: Recent Labs  Lab 08/10/23 0545 08/11/23 0508 08/12/23 0506 08/14/23 0921 08/15/23 0809  NA 130* 126* 129* 132* 132*  K 3.3* 3.6 4.2 3.3* 3.4*  CL 90* 88* 91* 94* 93*  CO2 30 22 28 27 27   GLUCOSE 120* 86 104* 104* 92  BUN 30* 47* 65* 47* 26*  CREATININE 3.58* 4.89* 5.80* 4.21* 3.16*  CALCIUM  9.0 9.0 9.2 9.1 9.3  PHOS  --   --   --  3.9 3.5   Liver Function Tests: Recent Labs  Lab 08/14/23 0921 08/15/23 0809  ALBUMIN 3.4* 3.6   CBG: No results for input(s): "GLUCAP" in the last 168 hours.  Discharge time spent: 32 minutes.  Signed: Colin Ellers, DO [facility] 08/15/2023

## 2023-08-28 ENCOUNTER — Ambulatory Visit (INDEPENDENT_AMBULATORY_CARE_PROVIDER_SITE_OTHER): Admitting: Vascular Surgery

## 2023-08-28 ENCOUNTER — Encounter (INDEPENDENT_AMBULATORY_CARE_PROVIDER_SITE_OTHER)

## 2023-09-26 ENCOUNTER — Other Ambulatory Visit (INDEPENDENT_AMBULATORY_CARE_PROVIDER_SITE_OTHER): Payer: Self-pay | Admitting: Vascular Surgery

## 2023-09-26 DIAGNOSIS — N186 End stage renal disease: Secondary | ICD-10-CM

## 2023-10-01 ENCOUNTER — Other Ambulatory Visit (INDEPENDENT_AMBULATORY_CARE_PROVIDER_SITE_OTHER): Payer: Self-pay | Admitting: Vascular Surgery

## 2023-10-01 DIAGNOSIS — Z9889 Other specified postprocedural states: Secondary | ICD-10-CM

## 2023-10-02 ENCOUNTER — Encounter (INDEPENDENT_AMBULATORY_CARE_PROVIDER_SITE_OTHER): Payer: Self-pay | Admitting: Vascular Surgery

## 2023-10-02 ENCOUNTER — Ambulatory Visit (INDEPENDENT_AMBULATORY_CARE_PROVIDER_SITE_OTHER)

## 2023-10-02 ENCOUNTER — Ambulatory Visit (INDEPENDENT_AMBULATORY_CARE_PROVIDER_SITE_OTHER): Admitting: Vascular Surgery

## 2023-10-02 VITALS — BP 170/77 | HR 69 | Resp 18 | Ht 73.0 in | Wt 168.0 lb

## 2023-10-02 DIAGNOSIS — Z9861 Coronary angioplasty status: Secondary | ICD-10-CM

## 2023-10-02 DIAGNOSIS — Z9889 Other specified postprocedural states: Secondary | ICD-10-CM | POA: Diagnosis not present

## 2023-10-02 DIAGNOSIS — I10 Essential (primary) hypertension: Secondary | ICD-10-CM | POA: Diagnosis not present

## 2023-10-02 DIAGNOSIS — I739 Peripheral vascular disease, unspecified: Secondary | ICD-10-CM | POA: Diagnosis not present

## 2023-10-02 DIAGNOSIS — J441 Chronic obstructive pulmonary disease with (acute) exacerbation: Secondary | ICD-10-CM

## 2023-10-02 DIAGNOSIS — N186 End stage renal disease: Secondary | ICD-10-CM

## 2023-10-02 DIAGNOSIS — I2510 Atherosclerotic heart disease of native coronary artery without angina pectoris: Secondary | ICD-10-CM

## 2023-10-04 ENCOUNTER — Encounter (INDEPENDENT_AMBULATORY_CARE_PROVIDER_SITE_OTHER): Payer: Self-pay | Admitting: Vascular Surgery

## 2023-10-04 DIAGNOSIS — N186 End stage renal disease: Secondary | ICD-10-CM | POA: Insufficient documentation

## 2023-10-04 NOTE — Progress Notes (Signed)
 MRN : [id number]  Gary Mccoy. is a 73 y.o. (1950-05-04) male who presents with chief complaint of check access.  History of Present Illness:    The patient is seen for evaluation of dialysis access.  The patient has recently started on dialysis.  Current access is via a catheter which is functioning well the flow rates have been adequate.  There have not been episodes of catheter infection.  The patient denies fever and chills while on dialysis.  No tenderness or drainage at the exit site.  No recent shortening of the patient's walking distance or new symptoms consistent with claudication.  No history of rest pain symptoms. No new ulcers or wounds of the lower extremities have occurred.  The patient denies amaurosis fugax or recent TIA symptoms. There are no recent neurological changes noted. There is no history of DVT, PE or superficial thrombophlebitis. No recent episodes of angina or shortness of breath documented.   Vein mapping of the bilateral upper extremities performed today demonstrates triphasic arterial flow throughout the cephalic vein at the level of the antecubital fossa it is greater than 3 mm bilaterally.  ABI's Rt=0.77 and Lt=1.18 (previous ABI's Rt=0.63 and Lt=0.91)  Current Meds  Medication Sig   acetaminophen  (TYLENOL ) 500 MG tablet Take 500 mg by mouth every 6 (six) hours as needed.   albuterol  (VENTOLIN  HFA) 108 (90 Base) MCG/ACT inhaler Inhale 2 puffs into the lungs every 6 (six) hours as needed for wheezing or shortness of breath.   allopurinol  (ZYLOPRIM ) 100 MG tablet Take 100 mg by mouth daily.   amLODipine  (NORVASC ) 5 MG tablet Take 2 tablets (10 mg total) by mouth daily.   apixaban  (ELIQUIS ) 2.5 MG TABS tablet Take 2.5 mg by mouth 2 (two) times daily.   Cholecalciferol  (VITAMIN D -1000 MAX ST) 25 MCG (1000 UT) tablet Take 1,000 Units by mouth daily.   furosemide  (LASIX ) 80 MG tablet Take 1 tablet (80 mg total) by mouth daily.  Take on days you do not receive dialysis   hydrALAZINE  (APRESOLINE ) 100 MG tablet Take 0.5 tablets (50 mg total) by mouth 3 (three) times daily.   isosorbide  mononitrate (IMDUR ) 60 MG 24 hr tablet Take 1 tablet (60 mg total) by mouth daily.   nitroGLYCERIN  (NITROSTAT ) 0.4 MG SL tablet Place 1 tablet (0.4 mg total) under the tongue every 5 (five) minutes x 3 doses as needed for chest pain.   Omega-3 Fatty Acids (FISH OIL PO) Take by mouth.   pantoprazole  (PROTONIX ) 40 MG tablet Take 40 mg by mouth daily.   rosuvastatin  (CRESTOR ) 40 MG tablet Take 20 mg by mouth daily.    Past Medical History:  Diagnosis Date   Arthritis    lower back   Back pain    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    CHF (congestive heart failure) (HCC)    Chronic kidney disease    Complication of anesthesia    Violent with hallucinations after procedure 10/2015   Coronary artery disease    DDD (degenerative disc disease), lumbar    Duodenal ulcer    Elevated lipids    GERD (gastroesophageal reflux disease)    Leg weakness, bilateral    s/p lumbar surgery   PAD (peripheral artery disease) (HCC)    Polyradiculitis    Stroke (HCC)    Rt lower leg  estimated 10 - 12 stokes in last 5 years  Wears dentures    full upper and lower    Past Surgical History:  Procedure Laterality Date   AORTA - BILATERAL FEMORAL ARTERY BYPASS GRAFT N/A 11/08/2015   Procedure: AORTA BIFEMORAL BYPASS GRAFT;  Surgeon: Cordella KANDICE Shawl, MD;  Location: [facility] ORS;  Service: Vascular;  Laterality: N/A;   BACK SURGERY  2005   CATARACT EXTRACTION W/PHACO Right 02/20/2022   Procedure: CATARACT EXTRACTION PHACO AND INTRAOCULAR LENS PLACEMENT (IOC) RIGHT;  Surgeon: Mittie Gaskin, MD;  Location: [facility];  Service: Ophthalmology;  Laterality: Right;  5.77 0:52.3   CATARACT EXTRACTION W/PHACO Left 03/06/2022   Procedure: CATARACT EXTRACTION PHACO AND INTRAOCULAR LENS PLACEMENT (IOC) LEFT;  Surgeon: Mittie Gaskin, MD;  Location:  [facility];  Service: Ophthalmology;  Laterality: Left;  5.77 1:07.5   CERVICAL DISC SURGERY  1987   COLONOSCOPY WITH PROPOFOL  N/A 12/30/2017   Procedure: COLONOSCOPY WITH PROPOFOL ;  Surgeon: Toledo, Ladell POUR, MD;  Location: [facility] ENDOSCOPY;  Service: Gastroenterology;  Laterality: N/A;   COLONOSCOPY WITH PROPOFOL  N/A 02/02/2021   Procedure: COLONOSCOPY WITH PROPOFOL ;  Surgeon: Maryruth Ole DASEN, MD;  Location: [facility] END[facility];  Service: Endoscopy;  Laterality: N/A;  Melena   DIALYSIS/PERMA CATHETER INSERTION N/A 08/05/2023   Procedure: DIALYSIS/PERMA CATHETER INSERTION;  Surgeon: Shawl Cordella KANDICE, MD;  Location: [facility] INVASIVE CV LAB;  Service: Cardiovascular;  Laterality: N/A;   ENDARTERECTOMY FEMORAL Bilateral 11/08/2015   Procedure: ENDARTERECTOMY FEMORAL;  Surgeon: Cordella KANDICE Shawl, MD;  Location: [facility] ORS;  Service: Vascular;  Laterality: Bilateral;  common, SFA, and profundis  Aortic endarterectomy    ESOPHAGOGASTRODUODENOSCOPY (EGD) WITH PROPOFOL  N/A 09/04/2015   Procedure: ESOPHAGOGASTRODUODENOSCOPY (EGD) WITH PROPOFOL ;  Surgeon: Rogelia Copping, MD;  Location: [facility];  Service: Endoscopy;  Laterality: N/A;   ESOPHAGOGASTRODUODENOSCOPY (EGD) WITH PROPOFOL  N/A 02/02/2021   Procedure: ESOPHAGOGASTRODUODENOSCOPY (EGD) WITH PROPOFOL ;  Surgeon: Maryruth Ole DASEN, MD;  Location: [facility];  Service: Endoscopy;  Laterality: N/A;  Melena   LUMBAR LAMINECTOMY  2006   RIGHT/LEFT HEART CATH AND CORONARY ANGIOGRAPHY Bilateral 03/28/2021   Procedure: RIGHT/LEFT HEART CATH AND CORONARY ANGIOGRAPHY;  Surgeon: Florencio Cara BIRCH, MD;  Location: [facility] IN[facility] CV LAB;  Service: Cardiovascular;  Laterality: Bilateral;   SEPTOPLASTY N/A 09/28/2020   Procedure: SEPTOPLASTY;  Surgeon: Edda Mt, MD;  Location: [facility];  Service: ENT;  Laterality: N/A;   TEE WITHOUT CARDIOVERSION N/A 09/22/2015   Procedure: TRANSESOPHAGEAL ECHOCARDIOGRAM (TEE);  Surgeon: Vinie DELENA Jude,  MD;  Location: [facility] OR[facility];  Service: Cardiovascular;  Laterality: N/A;   TEMPORARY DIALYSIS CATHETER N/A 07/16/2023   Procedure: TEMPORARY DIALYSIS CATHETER;  Surgeon: Marea Selinda RAMAN, MD;  Location: [facility] CV [facility];  Service: Cardiovascular;  Laterality: N/A;   TURBINATE REDUCTION Bilateral 09/28/2020   Procedure: TURBINATE REDUCTION;  Surgeon: Edda Mt, MD;  Location: [facility];  Service: ENT;  Laterality: Bilateral;    Social History Social History   Tobacco Use   Smoking status: Every Day    Current packs/day: 1.00    Average packs/day: 1 pack/day for 45.0 years (45.0 ttl pk-yrs)    Types: Cigarettes   Smokeless tobacco: Never   Tobacco comments:    05/17/21- currently 0.5 PPD  Vaping Use   Vaping status: Never Used  Substance Use Topics   Alcohol use: Yes    Comment: rare 3 a year   Drug use: No    Family History Family History  Problem Relation Age of Onset   Heart attack Mother    Hypertension  Mother    Varicose Veins Mother    Diabetes Father    Heart attack Father    Heart attack Maternal Grandmother    Stroke Maternal Grandmother     No Known Allergies   REVIEW OF SYSTEMS (Negative unless checked)  Constitutional: [] Weight loss  [] Fever  [] Chills Cardiac: [] Chest pain   [] Chest pressure   [] Palpitations   [] Shortness of breath when laying flat   [] Shortness of breath with exertion. Vascular:  [] Pain in legs with walking   [] Pain in legs at rest  [] History of DVT   [] Phlebitis   [] Swelling in legs   [] Varicose veins   [] Non-healing ulcers Pulmonary:   [] Uses home oxygen   [] Productive cough   [] Hemoptysis   [] Wheeze  [] COPD   [] Asthma Neurologic:  [] Dizziness   [] Seizures   [] History of stroke   [] History of TIA  [] Aphasia   [] Vissual changes   [] Weakness or numbness in arm   [] Weakness or numbness in leg Musculoskeletal:   [] Joint swelling   [] Joint pain   [] Low back pain Hematologic:  [] Easy bruising  [] Easy bleeding   [] Hypercoagulable state    [] Anemic Gastrointestinal:  [] Diarrhea   [] Vomiting  [] Gastroesophageal reflux/heartburn   [] Difficulty swallowing. Genitourinary:  [x] Chronic kidney disease   [] Difficult urination  [] Frequent urination   [] Blood in urine Skin:  [] Rashes   [] Ulcers  Psychological:  [] History of anxiety   []  History of major depression.  Physical Examination  Vitals:   10/02/23 1536 10/02/23 1539  BP: (!) 183/75 (!) 170/77  Pulse: 69   Resp: 18   Weight: 168 lb (76.2 kg)   Height: 6' 1 (1.854 m)    Body mass index is 22.16 kg/m. Gen: WD/WN, NAD Head: [place]/AT, No temporalis wasting.  Ear/Nose/Throat: Hearing grossly intact, nares w/o erythema or drainage Eyes: PER, EOMI, sclera nonicteric.  Neck: Supple, no gross masses or lesions.  No JVD.  Pulmonary:  Good air movement, no audible wheezing, no use of accessory muscles.  Cardiac: RRR, precordium non-hyperdynamic. Vascular:    Vessel Right Left  Radial Palpable Palpable  Brachial Palpable Palpable  Gastrointestinal: soft, non-distended. No guarding/no peritoneal signs.  Musculoskeletal: M/S 5/5 throughout.  No deformity.  Neurologic: CN 2-12 intact. Pain and light touch intact in extremities.  Symmetrical.  Speech is fluent. Motor exam as listed above. Psychiatric: Judgment intact, Mood & affect appropriate for pt's clinical situation. Dermatologic: No rashes or ulcers noted.  No changes consistent with cellulitis.   CBC Lab Results  Component Value Date   WBC 7.5 08/15/2023   HGB 10.5 (L) 08/15/2023   HCT 32.3 (L) 08/15/2023   MCV 86.1 08/15/2023   PLT 234 08/15/2023    BMET    Component Value Date/Time   NA 132 (L) 08/15/2023 0809   K 3.4 (L) 08/15/2023 0809   CL 93 (L) 08/15/2023 0809   CO2 27 08/15/2023 0809   GLUCOSE 92 08/15/2023 0809   BUN 26 (H) 08/15/2023 0809   CREATININE 3.16 (H) 08/15/2023 0809   CALCIUM  9.3 08/15/2023 0809   GFRNONAA 20 (L) 08/15/2023 0809   GFRAA 48 (L) 02/26/2018 1359   CrCl cannot be  calculated (Patient's most recent lab result is older than the maximum 21 days allowed.).  COAG Lab Results  Component Value Date   INR 1.2 07/11/2023   INR 1.9 (H) 01/29/2021   INR 2.24 02/26/2018    Radiology No results found.   Assessment/Plan 1. End stage renal disease (HCC) (Primary) Recommend:  At this time the patient does not have appropriate extremity access for dialysis  Patient should have a left brachial cephalic fistula created.  The risks, benefits and alternative therapies were reviewed in detail with the patient.  All questions were answered.  The patient agrees to proceed with surgery.   The patient will follow up with me in the office after the surgery.  2. PAD s/p aortobifemoral bypass 2017 (peripheral artery disease) Recommend:   The patient has evidence of atherosclerosis of the lower extremities with claudication.  The patient does not voice lifestyle limiting changes at this point in time.   Noninvasive studies do not suggest clinically significant change.   No invasive studies, angiography or surgery at this time The patient should continue walking and begin a more formal exercise program.  The patient should continue antiplatelet therapy and aggressive treatment of the lipid abnormalities   No changes in the patient's medications at this time  3. Benign essential HTN Continue antihypertensive medications as already ordered, these medications have been reviewed and there are no changes at this time.  4. CAD S/P percutaneous coronary angioplasty Continue cardiac and antihypertensive medications as already ordered and reviewed, no changes at this time.  Continue statin as ordered and reviewed, no changes at this time  Nitrates PRN for chest pain  5. COPD with acute exacerbation (HCC) Continue pulmonary medications and aerosols as already ordered, these medications have been reviewed and there are no changes at this time.     Cordella Shawl, MD  10/04/2023 10:40 AM

## 2023-10-07 LAB — VAS US ABI WITH/WO TBI
Left ABI: 1.18
Right ABI: 0.77

## 2023-10-08 ENCOUNTER — Other Ambulatory Visit: Payer: Self-pay | Admitting: Family Medicine

## 2023-10-08 DIAGNOSIS — M5416 Radiculopathy, lumbar region: Secondary | ICD-10-CM

## 2023-10-09 ENCOUNTER — Ambulatory Visit
Admission: RE | Admit: 2023-10-09 | Discharge: 2023-10-09 | Disposition: A | Discharge status: Home / Self Care | Source: Ambulatory Visit | Attending: Family Medicine | Admitting: Family Medicine

## 2023-10-09 DIAGNOSIS — M5416 Radiculopathy, lumbar region: Secondary | ICD-10-CM

## 2023-10-22 ENCOUNTER — Telehealth (INDEPENDENT_AMBULATORY_CARE_PROVIDER_SITE_OTHER): Payer: Self-pay

## 2023-10-22 NOTE — Telephone Encounter (Signed)
 Spoke with the patient and he is scheduled with Dr. Jama for a left brachial cephalic fistula on 11/14/23 at the [facility]. Pre-admit will call to schedule pre-op  at the [facility]. Pre-surgical instructions were discussed and will be sent to Mychart and mailed.

## 2023-11-07 ENCOUNTER — Inpatient Hospital Stay: Admission: RE | Admit: 2023-11-07 | Source: Ambulatory Visit

## 2023-11-07 ENCOUNTER — Other Ambulatory Visit: Payer: Self-pay

## 2023-11-07 ENCOUNTER — Other Ambulatory Visit (INDEPENDENT_AMBULATORY_CARE_PROVIDER_SITE_OTHER): Payer: Self-pay | Admitting: Nurse Practitioner

## 2023-11-07 ENCOUNTER — Encounter
Admission: RE | Admit: 2023-11-07 | Discharge: 2023-11-07 | Disposition: A | Discharge status: Home / Self Care | Source: Ambulatory Visit | Attending: Vascular Surgery | Admitting: Vascular Surgery

## 2023-11-07 VITALS — BP 114/67 | HR 70 | Temp 98.1°F | Resp 16 | Ht 73.0 in | Wt 174.0 lb

## 2023-11-07 DIAGNOSIS — I739 Peripheral vascular disease, unspecified: Secondary | ICD-10-CM | POA: Diagnosis not present

## 2023-11-07 DIAGNOSIS — Z01812 Encounter for preprocedural laboratory examination: Secondary | ICD-10-CM

## 2023-11-07 DIAGNOSIS — I10 Essential (primary) hypertension: Secondary | ICD-10-CM

## 2023-11-07 DIAGNOSIS — N186 End stage renal disease: Secondary | ICD-10-CM

## 2023-11-07 DIAGNOSIS — Z01818 Encounter for other preprocedural examination: Secondary | ICD-10-CM | POA: Insufficient documentation

## 2023-11-07 DIAGNOSIS — Z9889 Other specified postprocedural states: Secondary | ICD-10-CM | POA: Insufficient documentation

## 2023-11-07 HISTORY — DX: Occlusion and stenosis of bilateral carotid arteries: I65.23

## 2023-11-07 HISTORY — DX: Unspecified thoracic, thoracolumbar and lumbosacral intervertebral disc disorder: M51.9

## 2023-11-07 HISTORY — DX: Essential (primary) hypertension: I10

## 2023-11-07 HISTORY — DX: Chronic obstructive pulmonary disease, unspecified: J44.9

## 2023-11-07 HISTORY — DX: Atherosclerotic heart disease of native coronary artery without angina pectoris: I25.10

## 2023-11-07 HISTORY — DX: Mixed hyperlipidemia: E78.2

## 2023-11-07 HISTORY — DX: Chronic kidney disease, stage 4 (severe): N18.4

## 2023-11-07 LAB — CBC WITH DIFFERENTIAL/PLATELET
Abs Immature Granulocytes: 0.1 K/uL — ABNORMAL HIGH (ref 0.00–0.07)
Basophils Absolute: 0.1 K/uL (ref 0.0–0.1)
Basophils Relative: 1 %
Eosinophils Absolute: 0.3 K/uL (ref 0.0–0.5)
Eosinophils Relative: 3 %
HCT: 42.9 % (ref 39.0–52.0)
Hemoglobin: 14.2 g/dL (ref 13.0–17.0)
Immature Granulocytes: 1 %
Lymphocytes Relative: 8 %
Lymphs Abs: 0.8 K/uL (ref 0.7–4.0)
MCH: 30.1 pg (ref 26.0–34.0)
MCHC: 33.1 g/dL (ref 30.0–36.0)
MCV: 91.1 fL (ref 80.0–100.0)
Monocytes Absolute: 0.8 K/uL (ref 0.1–1.0)
Monocytes Relative: 8 %
Neutro Abs: 7.9 K/uL — ABNORMAL HIGH (ref 1.7–7.7)
Neutrophils Relative %: 79 %
Platelets: 199 K/uL (ref 150–400)
RBC: 4.71 MIL/uL (ref 4.22–5.81)
RDW: 16.6 % — ABNORMAL HIGH (ref 11.5–15.5)
WBC: 9.9 K/uL (ref 4.0–10.5)
nRBC: 0 % (ref 0.0–0.2)

## 2023-11-07 LAB — BASIC METABOLIC PANEL WITH GFR
Anion gap: 19 — ABNORMAL HIGH (ref 5–15)
BUN: 14 mg/dL (ref 8–23)
CO2: 25 mmol/L (ref 22–32)
Calcium: 9.5 mg/dL (ref 8.9–10.3)
Chloride: 94 mmol/L — ABNORMAL LOW (ref 98–111)
Creatinine, Ser: 2.31 mg/dL — ABNORMAL HIGH (ref 0.61–1.24)
GFR, Estimated: 29 mL/min — ABNORMAL LOW (ref >60)
Glucose, Bld: 123 mg/dL — ABNORMAL HIGH (ref 70–99)
Potassium: 3.9 mmol/L (ref 3.5–5.1)
Sodium: 138 mmol/L (ref 135–145)

## 2023-11-07 LAB — TYPE AND SCREEN
ABO/RH(D): O POS
Antibody Screen: NEGATIVE

## 2023-11-07 NOTE — Patient Instructions (Addendum)
 Your procedure is scheduled on: Friday, August 29 Report to the Registration Desk on the 1st floor of the [facility] To find out your arrival time, please call [phone] between 1PM - 3PM on: Thursday, August 28 If your arrival time is 6:00 am, do not arrive before that time as the [facility] entrance doors do not open until 6:00 am.  REMEMBER: Instructions that are not followed completely may result in serious medical risk, up to and including death; or upon the discretion of your surgeon and anesthesiologist your surgery may need to be rescheduled.  Do not eat or drink after midnight the night before surgery.  No gum chewing or hard candies.  One week prior to surgery: Stop Anti-inflammatories (NSAIDS) such as Advil, Aleve, Ibuprofen, Motrin, Naproxen, Naprosyn and Aspirin  based products such as Excedrin, Goody's Powder, BC Powder. Stop ANY OVER THE COUNTER supplements until after surgery.  You may however, continue to take Tylenol  if needed for pain up until the day of surgery.  Per Dr. Florencio secure chat:    apixaban  (ELIQUIS ) - hold 2 days before surgery. Last day to take is Tuesday, August 26. Resume AFTER surgery per surgeon instruction.  Continue taking all of your other prescription medications up until the day of surgery including clopidogrel  (PLAVIX )  ON THE DAY OF SURGERY ONLY TAKE THESE MEDICATIONS WITH SIPS OF WATER :  amLODipine   carvedilol   hydrALAZINE  isosorbide  mononitrate potassium chloride    Use albuterol  inhaler on the day of surgery and bring to the hospital.  No Alcohol for 24 hours before or after surgery.  No Smoking including e-cigarettes for 24 hours before surgery.  No chewable tobacco products for at least 6 hours before surgery.  No nicotine  patches on the day of surgery.  Do not use any recreational drugs for at least a week (preferably 2 weeks) before your surgery.  Please be advised that the combination of cocaine and anesthesia  may have negative outcomes, up to and including death. If you test positive for cocaine, your surgery will be cancelled.  On the morning of surgery brush your teeth with toothpaste and water , you may rinse your mouth with mouthwash if you wish. Do not swallow any toothpaste or mouthwash.  Use CHG Soap as directed on instruction sheet.  Do not wear jewelry, make-up, hairpins, clips or nail polish.  For welded (permanent) jewelry: bracelets, anklets, waist bands, etc.  Please have this removed prior to surgery.  If it is not removed, there is a chance that hospital personnel will need to cut it off on the day of surgery.  Do not wear lotions, powders, or perfumes.   Do not shave body hair from the neck down 48 hours before surgery.  Contact lenses, hearing aids and dentures may not be worn into surgery.  Do not bring valuables to the hospital. [facility] is not responsible for any missing/lost belongings or valuables.   Notify your doctor if there is any change in your medical condition (cold, fever, infection).  Wear comfortable clothing (specific to your surgery type) to the hospital.  After surgery, you can help prevent lung complications by doing breathing exercises.  Take deep breaths and cough every 1-2 hours.   If you are being discharged the day of surgery, you will not be allowed to drive home. You will need a responsible individual to drive you home and stay with you for 24 hours after surgery.   If you are taking public transportation, you will need to  have a responsible individual with you.  Please call the Pre-admissions Testing Dept. at [phone] if you have any questions about these instructions.  Surgery Visitation Policy:  Patients having surgery or a procedure may have two visitors.  Children under the age of 53 must have an adult with them who is not the patient.   Merchandiser, retail to address health-related social needs:   [email or web address]                                                                                                              Preparing for Surgery with CHLORHEXIDINE  GLUCONATE (CHG) Soap  Chlorhexidine  Gluconate (CHG) Soap  o An antiseptic cleaner that kills germs and bonds with the skin to continue killing germs even after washing  o Used for showering the night before surgery and morning of surgery  Before surgery, you can play an important role by reducing the number of germs on your skin.  CHG (Chlorhexidine  gluconate) soap is an antiseptic cleanser which kills germs and bonds with the skin to continue killing germs even after washing.  Please do not use if you have an allergy to CHG or antibacterial soaps. If your skin becomes reddened/irritated stop using the CHG.  1. Shower the NIGHT BEFORE SURGERY and the MORNING OF SURGERY with CHG soap.  2. If you choose to wash your hair, wash your hair first as usual with your normal shampoo.  3. After shampooing, rinse your hair and body thoroughly to remove the shampoo.  4. Use CHG as you would any other liquid soap. You can apply CHG directly to the skin and wash gently with a scrungie or a clean washcloth.  5. Apply the CHG soap to your body only from the neck down. Do not use on open wounds or open sores. Avoid contact with your eyes, ears, mouth, and genitals (private parts). Wash face and genitals (private parts) with your normal soap.  6. Wash thoroughly, paying special attention to the area where your surgery will be performed.  7. Thoroughly rinse your body with warm water .  8. Do not shower/wash with your normal soap after using and rinsing off the CHG soap.  9. Pat yourself dry with a clean towel.  10. Wear clean pajamas to bed the night before surgery.  12. Place clean sheets on your bed the night of your first shower and do not sleep with pets.  13. Shower again with the CHG soap on the day of  surgery prior to arriving at the hospital.  14. Do not apply any deodorants/lotions/powders.  15. Please wear clean clothes to the hospital.

## 2023-11-12 ENCOUNTER — Encounter: Payer: Self-pay | Admitting: Vascular Surgery
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
## 2023-11-14 ENCOUNTER — Ambulatory Visit: Payer: Self-pay | Admitting: Urgent Care

## 2023-11-14 ENCOUNTER — Encounter: Payer: Self-pay | Admitting: Vascular Surgery

## 2023-11-14 ENCOUNTER — Ambulatory Visit
Admission: RE | Admit: 2023-11-14 | Discharge: 2023-11-14 | Disposition: A | Discharge status: Home / Self Care | Attending: Vascular Surgery | Admitting: Vascular Surgery

## 2023-11-14 ENCOUNTER — Other Ambulatory Visit: Payer: Self-pay

## 2023-11-14 ENCOUNTER — Encounter
Admission: RE | Disposition: A | Discharge status: Home / Self Care | Payer: Self-pay | Source: Home / Self Care | Attending: Vascular Surgery

## 2023-11-14 DIAGNOSIS — Z7901 Long term (current) use of anticoagulants: Secondary | ICD-10-CM | POA: Insufficient documentation

## 2023-11-14 DIAGNOSIS — Z9861 Coronary angioplasty status: Secondary | ICD-10-CM | POA: Diagnosis not present

## 2023-11-14 DIAGNOSIS — I132 Hypertensive heart and chronic kidney disease with heart failure and with stage 5 chronic kidney disease, or end stage renal disease: Secondary | ICD-10-CM | POA: Diagnosis not present

## 2023-11-14 DIAGNOSIS — I5032 Chronic diastolic (congestive) heart failure: Secondary | ICD-10-CM | POA: Insufficient documentation

## 2023-11-14 DIAGNOSIS — Z992 Dependence on renal dialysis: Secondary | ICD-10-CM | POA: Insufficient documentation

## 2023-11-14 DIAGNOSIS — K219 Gastro-esophageal reflux disease without esophagitis: Secondary | ICD-10-CM | POA: Insufficient documentation

## 2023-11-14 DIAGNOSIS — F17210 Nicotine dependence, cigarettes, uncomplicated: Secondary | ICD-10-CM | POA: Insufficient documentation

## 2023-11-14 DIAGNOSIS — I739 Peripheral vascular disease, unspecified: Secondary | ICD-10-CM | POA: Diagnosis not present

## 2023-11-14 DIAGNOSIS — I2510 Atherosclerotic heart disease of native coronary artery without angina pectoris: Secondary | ICD-10-CM | POA: Insufficient documentation

## 2023-11-14 DIAGNOSIS — J449 Chronic obstructive pulmonary disease, unspecified: Secondary | ICD-10-CM | POA: Diagnosis not present

## 2023-11-14 DIAGNOSIS — I69998 Other sequelae following unspecified cerebrovascular disease: Secondary | ICD-10-CM | POA: Insufficient documentation

## 2023-11-14 DIAGNOSIS — E782 Mixed hyperlipidemia: Secondary | ICD-10-CM | POA: Insufficient documentation

## 2023-11-14 DIAGNOSIS — I420 Dilated cardiomyopathy: Secondary | ICD-10-CM | POA: Diagnosis not present

## 2023-11-14 DIAGNOSIS — Z79899 Other long term (current) drug therapy: Secondary | ICD-10-CM | POA: Insufficient documentation

## 2023-11-14 DIAGNOSIS — N186 End stage renal disease: Secondary | ICD-10-CM

## 2023-11-14 DIAGNOSIS — I10 Essential (primary) hypertension: Secondary | ICD-10-CM

## 2023-11-14 DIAGNOSIS — Z7902 Long term (current) use of antithrombotics/antiplatelets: Secondary | ICD-10-CM | POA: Diagnosis not present

## 2023-11-14 DIAGNOSIS — I69941 Monoplegia of lower limb following unspecified cerebrovascular disease affecting right dominant side: Secondary | ICD-10-CM | POA: Diagnosis not present

## 2023-11-14 DIAGNOSIS — Z01812 Encounter for preprocedural laboratory examination: Secondary | ICD-10-CM

## 2023-11-14 HISTORY — PX: AV FISTULA PLACEMENT: SHX1204

## 2023-11-14 HISTORY — DX: Other chronic pain: G89.29

## 2023-11-14 HISTORY — DX: Atherosclerosis of aorta: I70.0

## 2023-11-14 HISTORY — DX: Long term (current) use of anticoagulants: Z79.01

## 2023-11-14 HISTORY — DX: Complete loss of teeth, unspecified cause, unspecified class: K08.109

## 2023-11-14 HISTORY — DX: Dilated cardiomyopathy: I42.0

## 2023-11-14 HISTORY — DX: Unspecified diastolic (congestive) heart failure: I50.30

## 2023-11-14 HISTORY — DX: End stage renal disease: N18.6

## 2023-11-14 HISTORY — DX: Other cerebrovascular disease: I67.89

## 2023-11-14 HISTORY — DX: Pulmonary hypertension, unspecified: I27.20

## 2023-11-14 HISTORY — DX: Calculus of gallbladder without cholecystitis without obstruction: K80.20

## 2023-11-14 LAB — POCT I-STAT, CHEM 8
BUN: 27 mg/dL — ABNORMAL HIGH (ref 8–23)
Calcium, Ion: 1.15 mmol/L (ref 1.15–1.40)
Chloride: 98 mmol/L (ref 98–111)
Creatinine, Ser: 3.4 mg/dL — ABNORMAL HIGH (ref 0.61–1.24)
Glucose, Bld: 93 mg/dL (ref 70–99)
HCT: 36 % — ABNORMAL LOW (ref 39.0–52.0)
Hemoglobin: 12.2 g/dL — ABNORMAL LOW (ref 13.0–17.0)
Potassium: 4.2 mmol/L (ref 3.5–5.1)
Sodium: 136 mmol/L (ref 135–145)
TCO2: 25 mmol/L (ref 22–32)

## 2023-11-14 SURGERY — ARTERIOVENOUS (AV) FISTULA CREATION
Anesthesia: General | Site: Arm Upper | Laterality: Left

## 2023-11-14 MED ORDER — BUPIVACAINE LIPOSOME 1.3 % IJ SUSP
INTRAMUSCULAR | Status: DC | PRN
  Administered 2023-11-14: 30 mL via INTRAMUSCULAR

## 2023-11-14 MED ORDER — PROPOFOL 10 MG/ML IV BOLUS
INTRAVENOUS | Status: AC
  Filled 2023-11-14: qty 20

## 2023-11-14 MED ORDER — CHLORHEXIDINE GLUCONATE CLOTH 2 % EX PADS
6.0000 | MEDICATED_PAD | Freq: Once | CUTANEOUS | Status: AC
  Administered 2023-11-14: 2 via TOPICAL

## 2023-11-14 MED ORDER — FENTANYL CITRATE (PF) 100 MCG/2ML IJ SOLN
INTRAMUSCULAR | Status: AC
  Filled 2023-11-14: qty 2

## 2023-11-14 MED ORDER — ONDANSETRON HCL 4 MG/2ML IJ SOLN
INTRAMUSCULAR | Status: AC
  Filled 2023-11-14: qty 2

## 2023-11-14 MED ORDER — CEFAZOLIN SODIUM-DEXTROSE 2-4 GM/100ML-% IV SOLN
2.0000 g | INTRAVENOUS | Status: AC
  Administered 2023-11-14: 2 g via INTRAVENOUS

## 2023-11-14 MED ORDER — BUPIVACAINE LIPOSOME 1.3 % IJ SUSP
INTRAMUSCULAR | Status: AC
  Filled 2023-11-14: qty 20

## 2023-11-14 MED ORDER — FENTANYL CITRATE (PF) 100 MCG/2ML IJ SOLN
INTRAMUSCULAR | Status: DC | PRN
  Administered 2023-11-14 (×2): 50 ug via INTRAVENOUS

## 2023-11-14 MED ORDER — PAPAVERINE HCL 30 MG/ML IJ SOLN
INTRAMUSCULAR | Status: AC
  Filled 2023-11-14: qty 2

## 2023-11-14 MED ORDER — DEXAMETHASONE SODIUM PHOSPHATE 10 MG/ML IJ SOLN
INTRAMUSCULAR | Status: AC
  Filled 2023-11-14: qty 1

## 2023-11-14 MED ORDER — LIDOCAINE HCL (PF) 2 % IJ SOLN
INTRAMUSCULAR | Status: AC
  Filled 2023-11-14: qty 5

## 2023-11-14 MED ORDER — HEMOSTATIC AGENTS (NO CHARGE) OPTIME
TOPICAL | Status: DC | PRN
  Administered 2023-11-14: 1 via TOPICAL

## 2023-11-14 MED ORDER — VASHE WOUND IRRIGATION OPTIME
TOPICAL | Status: DC | PRN
  Administered 2023-11-14: 4 [oz_av]

## 2023-11-14 MED ORDER — HEPARIN SODIUM (PORCINE) 5000 UNIT/ML IJ SOLN
INTRAMUSCULAR | Status: AC
  Filled 2023-11-14: qty 1

## 2023-11-14 MED ORDER — DEXAMETHASONE SODIUM PHOSPHATE 10 MG/ML IJ SOLN
INTRAMUSCULAR | Status: DC | PRN
Start: 2023-11-14 — End: 2023-11-14
  Administered 2023-11-14: 10 mg via INTRAVENOUS

## 2023-11-14 MED ORDER — EPHEDRINE 5 MG/ML INJ
INTRAVENOUS | Status: AC
  Filled 2023-11-14: qty 5

## 2023-11-14 MED ORDER — ONDANSETRON HCL 4 MG/2ML IJ SOLN
4.0000 mg | Freq: Four times a day (QID) | INTRAMUSCULAR | Status: DC | PRN
  Administered 2023-11-14: 4 mg via INTRAVENOUS

## 2023-11-14 MED ORDER — CHLORHEXIDINE GLUCONATE 0.12 % MT SOLN
OROMUCOSAL | Status: AC
  Filled 2023-11-14: qty 15

## 2023-11-14 MED ORDER — HYDROMORPHONE HCL 1 MG/ML IJ SOLN: 1.0000 mg | Freq: Once | INTRAMUSCULAR | Status: DC | PRN

## 2023-11-14 MED ORDER — CHLORHEXIDINE GLUCONATE 0.12 % MT SOLN
15.0000 mL | Freq: Once | OROMUCOSAL | Status: AC
  Administered 2023-11-14: 15 mL via OROMUCOSAL

## 2023-11-14 MED ORDER — HYDROCODONE-ACETAMINOPHEN 5-325 MG PO TABS: 1.0000 | ORAL_TABLET | Freq: Four times a day (QID) | ORAL | 0 refills | Status: AC | PRN

## 2023-11-14 MED ORDER — SODIUM CHLORIDE 0.9 % IV SOLN
INTRAVENOUS | Status: DC | PRN
  Administered 2023-11-14: 500 mL

## 2023-11-14 MED ORDER — EPHEDRINE SULFATE-NACL 50-0.9 MG/10ML-% IV SOSY
PREFILLED_SYRINGE | INTRAVENOUS | Status: DC | PRN
  Administered 2023-11-14: 10 mg via INTRAVENOUS

## 2023-11-14 MED ORDER — PROPOFOL 10 MG/ML IV BOLUS
INTRAVENOUS | Status: DC | PRN
  Administered 2023-11-14: 50 mg via INTRAVENOUS
  Administered 2023-11-14: 150 mg via INTRAVENOUS

## 2023-11-14 MED ORDER — LIDOCAINE HCL (CARDIAC) PF 100 MG/5ML IV SOSY
PREFILLED_SYRINGE | INTRAVENOUS | Status: DC | PRN
  Administered 2023-11-14: 50 mg via INTRAVENOUS

## 2023-11-14 MED ORDER — SODIUM CHLORIDE 0.9 % IV SOLN: INTRAVENOUS | Status: DC

## 2023-11-14 MED ORDER — SEVOFLURANE IN SOLN
RESPIRATORY_TRACT | Status: AC
  Filled 2023-11-14: qty 250

## 2023-11-14 MED ORDER — CEFAZOLIN SODIUM-DEXTROSE 2-4 GM/100ML-% IV SOLN
INTRAVENOUS | Status: AC
  Filled 2023-11-14: qty 100

## 2023-11-14 MED ORDER — ORAL CARE MOUTH RINSE: 15.0000 mL | Freq: Once | OROMUCOSAL | Status: AC

## 2023-11-14 MED ORDER — ACETAMINOPHEN 10 MG/ML IV SOLN
INTRAVENOUS | Status: DC | PRN
  Administered 2023-11-14: 1000 mg via INTRAVENOUS

## 2023-11-14 MED ORDER — BUPIVACAINE HCL (PF) 0.5 % IJ SOLN
INTRAMUSCULAR | Status: AC
  Filled 2023-11-14: qty 30

## 2023-11-14 MED ORDER — CHLORHEXIDINE GLUCONATE CLOTH 2 % EX PADS: 6.0000 | MEDICATED_PAD | Freq: Once | CUTANEOUS | Status: DC

## 2023-11-14 SURGICAL SUPPLY — 42 items
BAG DECANTER FOR FLEXI CONT (MISCELLANEOUS) ×1 IMPLANT
BLADE SURG SZ11 CARB STEEL (BLADE) ×1 IMPLANT
BRUSH SCRUB EZ 4% CHG (MISCELLANEOUS) ×1 IMPLANT
CHLORAPREP W/TINT 26 (MISCELLANEOUS) ×1 IMPLANT
CLAMP SUTURE YELLOW 5 PAIRS (MISCELLANEOUS) ×1 IMPLANT
CLEANSER WND VASHE 4 (WOUND CARE) IMPLANT
CLIP APPLIE 11 MED OPEN (CLIP) IMPLANT
CLIP APPLIE 9.375 SM OPEN (CLIP) IMPLANT
DERMABOND ADVANCED .7 DNX12 (GAUZE/BANDAGES/DRESSINGS) ×1 IMPLANT
DRESSING SURGICEL FIBRLLR 1X2 (HEMOSTASIS) ×1 IMPLANT
DRSG OPSITE POSTOP 3X4 (GAUZE/BANDAGES/DRESSINGS) IMPLANT
ELECT CAUTERY BLADE 6.4 (BLADE) ×1 IMPLANT
ELECTRODE REM PT RTRN 9FT ADLT (ELECTROSURGICAL) ×1 IMPLANT
GEL ULTRASOUND 20GR AQUASONIC (MISCELLANEOUS) IMPLANT
GLOVE BIO SURGEON STRL SZ7 (GLOVE) ×1 IMPLANT
GLOVE SURG SYN 8.0 PF PI (GLOVE) ×1 IMPLANT
GOWN STRL REUS W/ TWL LRG LVL3 (GOWN DISPOSABLE) ×2 IMPLANT
GOWN STRL REUS W/ TWL XL LVL3 (GOWN DISPOSABLE) ×1 IMPLANT
IV NS 500ML BAXH (IV SOLUTION) ×1 IMPLANT
KIT TURNOVER KIT A (KITS) ×1 IMPLANT
LABEL OR SOLS (LABEL) ×1 IMPLANT
LOOP VESSEL MAXI 1X406 RED (MISCELLANEOUS) ×1 IMPLANT
LOOP VESSEL MINI 0.8X406 BLUE (MISCELLANEOUS) ×2 IMPLANT
MANIFOLD NEPTUNE II (INSTRUMENTS) ×1 IMPLANT
NDL FILTER BLUNT 18X1 1/2 (NEEDLE) ×1 IMPLANT
NDL HYPO 30X.5 LL (NEEDLE) IMPLANT
NEEDLE FILTER BLUNT 18X1 1/2 (NEEDLE) ×1 IMPLANT
NEEDLE HYPO 30X.5 LL (NEEDLE) ×1 IMPLANT
PACK EXTREMITY ARMC (MISCELLANEOUS) ×1 IMPLANT
PAD PREP OB/GYN DISP 24X41 (PERSONAL CARE ITEMS) ×1 IMPLANT
PENCIL SMOKE EVACUATOR (MISCELLANEOUS) ×1 IMPLANT
STOCKINETTE 48X4 2 PLY STRL (GAUZE/BANDAGES/DRESSINGS) ×1 IMPLANT
STOCKINETTE STRL 4IN 9604848 (GAUZE/BANDAGES/DRESSINGS) ×1 IMPLANT
SUT MNCRL+ 5-0 UNDYED PC-3 (SUTURE) ×1 IMPLANT
SUT PROLENE 6 0 BV (SUTURE) ×4 IMPLANT
SUT SILK 2-0 18XBRD TIE 12 (SUTURE) ×1 IMPLANT
SUT SILK 3-0 18XBRD TIE 12 (SUTURE) ×1 IMPLANT
SUT SILK 4-0 18XBRD TIE 12 (SUTURE) ×1 IMPLANT
SUT VIC AB 3-0 SH 27X BRD (SUTURE) ×1 IMPLANT
SYR 20ML LL LF (SYRINGE) ×1 IMPLANT
SYR 3ML LL SCALE MARK (SYRINGE) ×1 IMPLANT
TRAP FLUID SMOKE EVACUATOR (MISCELLANEOUS) ×1 IMPLANT

## 2023-11-14 NOTE — Anesthesia Procedure Notes (Signed)
 Procedure Name: LMA Insertion Date/Time: 11/14/2023 10:48 AM  Performed by: Lorrene Camelia LABOR, CRNAPre-anesthesia Checklist: Patient identified, Patient being monitored, Timeout performed, Emergency Drugs available and Suction available Patient Re-evaluated:Patient Re-evaluated prior to induction Oxygen Delivery Method: Circle system utilized Preoxygenation: Pre-oxygenation with 100% oxygen Induction Type: IV induction Ventilation: Mask ventilation without difficulty LMA: LMA inserted LMA Size: 5.0 Tube type: Oral Number of attempts: 1 Placement Confirmation: positive ETCO2 and breath sounds checked- equal and bilateral Tube secured with: Tape Dental Injury: Teeth and Oropharynx as per pre-operative assessment
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
## 2023-11-14 NOTE — Transfer of Care (Signed)
 Immediate Anesthesia Transfer of Care Note  Patient: Gary Mccoy.  Procedure(s) Performed: ARTERIOVENOUS (AV) FISTULA CREATION (Left: Arm Upper)  Patient Location: PACU  Anesthesia Type:General  Level of Consciousness: drowsy and patient cooperative  Airway & Oxygen Therapy: Patient Spontanous Breathing and Patient connected to nasal cannula oxygen  Post-op Assessment: Report given to RN and Post -op Vital signs reviewed and stable  Post vital signs: Reviewed and stable  Last Vitals:  Vitals Value Taken Time  BP 128/59 11/14/23 12:35  Temp    Pulse 57 11/14/23 12:35  Resp 11 11/14/23 12:35  SpO2 99 % 11/14/23 12:35  Vitals shown include unfiled device data.  Last Pain:  Vitals:   11/14/23 1235  TempSrc:   PainSc: 0-No pain      Patients Stated Pain Goal: 0 (11/14/23 0923)  Complications: No notable events documented.

## 2023-11-14 NOTE — Anesthesia Postprocedure Evaluation (Signed)
 Anesthesia Post Note  Patient: Gary Mccoy.  Procedure(s) Performed: ARTERIOVENOUS (AV) FISTULA CREATION (Left: Arm Upper)  Patient location during evaluation: PACU Anesthesia Type: General Level of consciousness: awake and alert Pain management: pain level controlled Vital Signs Assessment: post-procedure vital signs reviewed and stable Respiratory status: spontaneous breathing, nonlabored ventilation, respiratory function stable and patient connected to nasal cannula oxygen Cardiovascular status: blood pressure returned to baseline and stable Postop Assessment: no apparent nausea or vomiting Anesthetic complications: no   No notable events documented.   Last Vitals:  Vitals:   11/14/23 1232 11/14/23 1235  BP: (!) 128/59 (!) 128/59  Pulse: (!) 57 (!) 57  Resp: 12 11  Temp: (!) 36.3 C   SpO2: 98% 100%    Last Pain:  Vitals:   11/14/23 1235  TempSrc:   PainSc: 0-No pain                 Debby Mines

## 2023-11-14 NOTE — Op Note (Signed)
     OPERATIVE NOTE   PROCEDURE: left brachial cephalic arteriovenous fistula placement  PRE-OPERATIVE DIAGNOSIS: End Stage Renal Disease  POST-OPERATIVE DIAGNOSIS: End Stage Renal Disease  SURGEON: Cordella Shawl  ASSISTANT(S): None  ANESTHESIA: general  ESTIMATED BLOOD LOSS: <50 cc  FINDING(S): 4.5 mm vein  SPECIMEN(S):  none  INDICATIONS:   Gary Mccoy. is a 73 y.o. male who presents with end stage renal disease.  The patient is scheduled for left brachiocephalic arteriovenous fistula placement.  The patient is aware the risks include but are not limited to: bleeding, infection, steal syndrome, nerve damage, ischemic monomelic neuropathy, failure to mature, and need for additional procedures.  The patient is aware of the risks of the procedure and elects to proceed forward.  DESCRIPTION: After full informed written consent was obtained from the patient, the patient was brought back to the operating room and placed supine upon the operating table.  Prior to induction, the patient received IV antibiotics.   After obtaining adequate anesthesia, the patient was then prepped and draped in the standard fashion for a left arm access procedure.   A curvilinear incision was then created midway between the radial impulse and the cephalic vein. The cephalic vein was then identified and dissected circumferentially. It was marked with a surgical marker.    Attention was then turned to the brachial artery which was exposed through the same incision and looped proximally and distally. Side branches were controlled with 4-0 silk ties.  The distal segment of the vein was ligated with a  2-0 silk, and the vein was transected.  The proximal segment was interrogated with serial dilators.  The vein accepted up to a 4.5 mm dilator without any difficulty. Heparinized saline was infused into the vein and clamped it with a small bulldog.  At this point, I reset my exposure of the brachial  artery and controlled the artery with vessel loops proximally and distally.  An arteriotomy was then made with a #11 blade, and extended with a Potts scissor.  Heparinized saline was injected proximal and distal into the radial artery.  The vein was then approximated to the artery while the artery was in its native bed and subsequently the vein was beveled using Potts scissors. The vein was then sewn to the artery in an end-to-side configuration with a running stitch of 6-0 Prolene.  Prior to completing this anastomosis Flushing maneuvers were performed and the artery was allowed to forward and back bleed.  There was no evidence of clot from any vessels.  I completed the anastomosis in the usual fashion and then released all vessel loops and clamps.    There was good  thrill in the venous outflow, and there was 1+ palpable radial pulse.  At this point, I irrigated out the surgical wound.  There was no further active bleeding.  The subcutaneous tissue was reapproximated with a running stitch of 3-0 Vicryl.  The skin was then reapproximated with a running subcuticular stitch of 4-0 Vicryl.  The skin was then cleaned, dried, and reinforced with Dermabond.    The patient tolerated this procedure well.   COMPLICATIONS: None  CONDITION: Metta Cordella Shawl [facility]  Office: [phone]   11/14/2023, 12:38 PM
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
## 2023-11-14 NOTE — Interval H&P Note (Signed)
 History and Physical Interval Note:  11/14/2023 10:13 AM  Gary Mccoy.  has presented today for surgery, with the diagnosis of ESRD.  The various methods of treatment have been discussed with the patient and family. After consideration of risks, benefits and other options for treatment, the patient has consented to  Procedure(s) with comments: ARTERIOVENOUS (AV) FISTULA CREATION (Left) - BRACHIAL CEPHALIC as a surgical intervention.  [name] history has been reviewed, patient examined, no change in status, stable for surgery.  I have reviewed the patient's chart and labs.  Questions were answered to the patient's satisfaction.     Cordella Shawl

## 2023-11-15 ENCOUNTER — Encounter: Payer: Self-pay | Admitting: Vascular Surgery

## 2023-11-27 ENCOUNTER — Other Ambulatory Visit (INDEPENDENT_AMBULATORY_CARE_PROVIDER_SITE_OTHER): Payer: Self-pay | Admitting: Vascular Surgery

## 2023-11-27 DIAGNOSIS — N186 End stage renal disease: Secondary | ICD-10-CM

## 2023-12-02 ENCOUNTER — Ambulatory Visit (INDEPENDENT_AMBULATORY_CARE_PROVIDER_SITE_OTHER)

## 2023-12-02 ENCOUNTER — Ambulatory Visit (INDEPENDENT_AMBULATORY_CARE_PROVIDER_SITE_OTHER): Admitting: Nurse Practitioner

## 2023-12-02 ENCOUNTER — Encounter (INDEPENDENT_AMBULATORY_CARE_PROVIDER_SITE_OTHER): Payer: Self-pay | Admitting: Nurse Practitioner

## 2023-12-02 VITALS — BP 123/71 | HR 67 | Resp 16 | Ht 73.0 in | Wt 179.0 lb

## 2023-12-02 DIAGNOSIS — N186 End stage renal disease: Secondary | ICD-10-CM

## 2023-12-07 ENCOUNTER — Encounter (INDEPENDENT_AMBULATORY_CARE_PROVIDER_SITE_OTHER): Payer: Self-pay | Admitting: Nurse Practitioner
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
## 2024-01-05 ENCOUNTER — Other Ambulatory Visit (INDEPENDENT_AMBULATORY_CARE_PROVIDER_SITE_OTHER): Payer: Self-pay | Admitting: Nurse Practitioner

## 2024-01-05 DIAGNOSIS — N186 End stage renal disease: Secondary | ICD-10-CM

## 2024-01-06 ENCOUNTER — Ambulatory Visit (INDEPENDENT_AMBULATORY_CARE_PROVIDER_SITE_OTHER)

## 2024-01-06 ENCOUNTER — Ambulatory Visit (INDEPENDENT_AMBULATORY_CARE_PROVIDER_SITE_OTHER): Admitting: Nurse Practitioner

## 2024-01-06 ENCOUNTER — Encounter (INDEPENDENT_AMBULATORY_CARE_PROVIDER_SITE_OTHER): Payer: Self-pay | Admitting: Nurse Practitioner

## 2024-01-06 VITALS — BP 139/64 | HR 66 | Resp 18 | Ht 73.0 in | Wt 179.4 lb

## 2024-01-06 DIAGNOSIS — I10 Essential (primary) hypertension: Secondary | ICD-10-CM

## 2024-01-06 DIAGNOSIS — N186 End stage renal disease: Secondary | ICD-10-CM
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
## 2024-01-15 ENCOUNTER — Telehealth (INDEPENDENT_AMBULATORY_CARE_PROVIDER_SITE_OTHER): Payer: Self-pay

## 2024-01-15 NOTE — Telephone Encounter (Signed)
 Spoke with the patient and he is scheduled with Dr. Jama for a left arm fistulagram on 01/20/24 with a 2:00 pm arrival time to the [facility]. Pre-procedure instructions were discussed and will be sent to Mychart and mailed.

## 2024-01-20 ENCOUNTER — Other Ambulatory Visit: Payer: Self-pay

## 2024-01-20 ENCOUNTER — Encounter: Payer: Self-pay | Admitting: Vascular Surgery

## 2024-01-20 ENCOUNTER — Encounter
Admission: RE | Disposition: A | Discharge status: Home / Self Care | Payer: Self-pay | Source: Home / Self Care | Attending: Vascular Surgery

## 2024-01-20 ENCOUNTER — Ambulatory Visit
Admission: RE | Admit: 2024-01-20 | Discharge: 2024-01-20 | Disposition: A | Discharge status: Home / Self Care | Attending: Vascular Surgery | Admitting: Vascular Surgery

## 2024-01-20 DIAGNOSIS — I5032 Chronic diastolic (congestive) heart failure: Secondary | ICD-10-CM | POA: Insufficient documentation

## 2024-01-20 DIAGNOSIS — N186 End stage renal disease: Secondary | ICD-10-CM | POA: Insufficient documentation

## 2024-01-20 DIAGNOSIS — Z79899 Other long term (current) drug therapy: Secondary | ICD-10-CM | POA: Diagnosis not present

## 2024-01-20 DIAGNOSIS — I132 Hypertensive heart and chronic kidney disease with heart failure and with stage 5 chronic kidney disease, or end stage renal disease: Secondary | ICD-10-CM | POA: Diagnosis not present

## 2024-01-20 DIAGNOSIS — F17210 Nicotine dependence, cigarettes, uncomplicated: Secondary | ICD-10-CM | POA: Diagnosis not present

## 2024-01-20 DIAGNOSIS — Y832 Surgical operation with anastomosis, bypass or graft as the cause of abnormal reaction of the patient, or of later complication, without mention of misadventure at the time of the procedure: Secondary | ICD-10-CM | POA: Insufficient documentation

## 2024-01-20 DIAGNOSIS — Z992 Dependence on renal dialysis: Secondary | ICD-10-CM | POA: Insufficient documentation

## 2024-01-20 DIAGNOSIS — T82898A Other specified complication of vascular prosthetic devices, implants and grafts, initial encounter: Secondary | ICD-10-CM | POA: Insufficient documentation

## 2024-01-20 HISTORY — PX: A/V FISTULAGRAM: CATH118298

## 2024-01-20 LAB — POTASSIUM (ARMC VASCULAR LAB ONLY): Potassium (ARMC vascular lab): 4.3 mmol/L (ref 3.5–5.1)

## 2024-01-20 SURGERY — A/V FISTULAGRAM
Anesthesia: Moderate Sedation | Laterality: Left

## 2024-01-20 MED ORDER — HEPARIN (PORCINE) IN NACL 2000-0.9 UNIT/L-% IV SOLN
INTRAVENOUS | Status: DC | PRN
Start: 2024-01-20 — End: 2024-01-20
  Administered 2024-01-20: 1000 mL

## 2024-01-20 MED ORDER — CEFAZOLIN SODIUM-DEXTROSE 1-4 GM/50ML-% IV SOLN
INTRAVENOUS | Status: AC
Start: 2024-01-20 — End: 2024-01-20
  Filled 2024-01-20: qty 50

## 2024-01-20 MED ORDER — HEPARIN SODIUM (PORCINE) 1000 UNIT/ML IJ SOLN
INTRAMUSCULAR | Status: DC | PRN
  Administered 2024-01-20: 4000 [IU] via INTRAVENOUS

## 2024-01-20 MED ORDER — HYDROMORPHONE HCL 1 MG/ML IJ SOLN: 1.0000 mg | Freq: Once | INTRAMUSCULAR | Status: DC | PRN

## 2024-01-20 MED ORDER — IODIXANOL 320 MG/ML IV SOLN
INTRAVENOUS | Status: DC | PRN
Start: 2024-01-20 — End: 2024-01-20
  Administered 2024-01-20: 45 mL

## 2024-01-20 MED ORDER — FENTANYL CITRATE (PF) 50 MCG/ML IJ SOSY
PREFILLED_SYRINGE | INTRAMUSCULAR | Status: AC
  Filled 2024-01-20: qty 1

## 2024-01-20 MED ORDER — MIDAZOLAM HCL (PF) 2 MG/2ML IJ SOLN
INTRAMUSCULAR | Status: DC | PRN
  Administered 2024-01-20: 1 mg via INTRAVENOUS
  Administered 2024-01-20: 2 mg via INTRAVENOUS

## 2024-01-20 MED ORDER — HEPARIN SODIUM (PORCINE) 1000 UNIT/ML IJ SOLN
INTRAMUSCULAR | Status: AC
Start: 2024-01-20 — End: 2024-01-20
  Filled 2024-01-20: qty 10

## 2024-01-20 MED ORDER — CEFAZOLIN SODIUM-DEXTROSE 1-4 GM/50ML-% IV SOLN
1.0000 g | INTRAVENOUS | Status: AC
  Administered 2024-01-20: 1 g via INTRAVENOUS

## 2024-01-20 MED ORDER — SODIUM CHLORIDE 0.9 % IV SOLN: INTRAVENOUS | Status: DC

## 2024-01-20 MED ORDER — NITROGLYCERIN 1 MG/10 ML FOR IR/CATH LAB
INTRA_ARTERIAL | Status: DC | PRN
Start: 2024-01-20 — End: 2024-01-20
  Administered 2024-01-20: 200 ug via INTRAVENOUS

## 2024-01-20 MED ORDER — MIDAZOLAM HCL 2 MG/ML PO SYRP: 8.0000 mg | ORAL_SOLUTION | Freq: Once | ORAL | Status: DC | PRN

## 2024-01-20 MED ORDER — MIDAZOLAM HCL 2 MG/2ML IJ SOLN
INTRAMUSCULAR | Status: AC
  Filled 2024-01-20: qty 2

## 2024-01-20 MED ORDER — FENTANYL CITRATE (PF) 100 MCG/2ML IJ SOLN
INTRAMUSCULAR | Status: DC | PRN
  Administered 2024-01-20: 50 ug via INTRAVENOUS
  Administered 2024-01-20: 25 ug via INTRAVENOUS

## 2024-01-20 MED ORDER — DIPHENHYDRAMINE HCL 50 MG/ML IJ SOLN: 50.0000 mg | Freq: Once | INTRAMUSCULAR | Status: DC | PRN

## 2024-01-20 MED ORDER — ONDANSETRON HCL 4 MG/2ML IJ SOLN: 4.0000 mg | Freq: Four times a day (QID) | INTRAMUSCULAR | Status: DC | PRN

## 2024-01-20 MED ORDER — METHYLPREDNISOLONE SODIUM SUCC 125 MG IJ SOLR: 125.0000 mg | Freq: Once | INTRAMUSCULAR | Status: DC | PRN

## 2024-01-20 MED ORDER — LIDOCAINE HCL (PF) 1 % IJ SOLN
INTRAMUSCULAR | Status: DC | PRN
Start: 2024-01-20 — End: 2024-01-20
  Administered 2024-01-20: 10 mL

## 2024-01-20 MED ORDER — FAMOTIDINE 20 MG PO TABS: 40.0000 mg | ORAL_TABLET | Freq: Once | ORAL | Status: DC | PRN

## 2024-01-20 SURGICAL SUPPLY — 20 items
BALLOON LUTONIX AV 8X60X75 (BALLOONS) IMPLANT
BALLOON LUTONIX DCB 7X40X130 (BALLOONS) IMPLANT
CATH MICROCATH PRGRT 2.8F 110 (CATHETERS) IMPLANT
CATH TEMPO 5F RIM 65CM (CATHETERS) IMPLANT
COIL 400 COMPLEX STD 7X25CM (Vascular Products) IMPLANT
COVER PROBE ULTRASOUND 5X96 (MISCELLANEOUS) IMPLANT
DEVICE OCCLUSION PODJ15 (Vascular Products) IMPLANT
DEVICE OCCLUSION PODJ30 (Vascular Products) IMPLANT
DEVICE PRESTO INFLATION (MISCELLANEOUS) IMPLANT
DRAPE BRACHIAL (DRAPES) IMPLANT
GOWN STRL REUS W/ TWL LRG LVL3 (GOWN DISPOSABLE) ×1 IMPLANT
GUIDEWIRE ANGLED .035 180CM (WIRE) IMPLANT
HANDLE DETACHMENT COIL (MISCELLANEOUS) IMPLANT
NDL ENTRY 21GA 7CM ECHOTIP (NEEDLE) IMPLANT
NEEDLE ENTRY 21GA 7CM ECHOTIP (NEEDLE) ×1 IMPLANT
PACK ANGIOGRAPHY (CUSTOM PROCEDURE TRAY) ×1 IMPLANT
SET INTRO CAPELLA COAXIAL (SET/KITS/TRAYS/PACK) IMPLANT
SHEATH BRITE TIP 6FRX5.5 (SHEATH) IMPLANT
SUT MNCRL AB 4-0 PS2 18 (SUTURE) IMPLANT
WIRE SUPRACORE 190CM (WIRE) IMPLANT

## 2024-01-20 NOTE — Interval H&P Note (Signed)
 History and Physical Interval Note:  01/20/2024 2:53 PM  [name].  has presented today for surgery, with the diagnosis of L Arm Fistulagram   End Stage Renal.  The various methods of treatment have been discussed with the patient and family. After consideration of risks, benefits and other options for treatment, the patient has consented to  Procedure(s): A/V Fistulagram (Left) as a surgical intervention.  [name] history has been reviewed, patient examined, no change in status, stable for surgery.  I have reviewed the patient's chart and labs.  Questions were answered to the patient's satisfaction.     Cordella Shawl

## 2024-01-20 NOTE — Op Note (Signed)
 OPERATIVE NOTE   PROCEDURE: Contrast injection left brachiocephalic AV access Percutaneous transluminal angioplasty peripheral segment to 8 mm left brachiocephalic AV access. Coil embolization of a large tributary from the cephalic vein several centimeters above the anastomosis  PRE-OPERATIVE DIAGNOSIS: Complication of dialysis access                                                       End Stage Renal Disease  POST-OPERATIVE DIAGNOSIS: same as above   SURGEON: Cordella JUDITHANN Shawl, M.D.  ANESTHESIA: Conscious sedation was administered under my direct supervision by the interventional radiology RN. IV Versed  plus fentanyl  were utilized. Continuous ECG, pulse oximetry and blood pressure was monitored throughout the entire procedure.  Conscious sedation was for a total of 45 minutes.  ESTIMATED BLOOD LOSS: minimal  FINDING(S): Stricture of the AV graft secondary to a valve associated with a large tributary which measured 6 to 7 mm in the cephalic vein measured 8 mm  SPECIMEN(S):  None  CONTRAST: 45 cc  FLUOROSCOPY TIME: 4.0 minutes  INDICATIONS: Gary Mccoy. is a 73 y.o. male who  presents with malfunctioning left arm AV access.  The patient is scheduled for angiography with possible intervention of the AV access.  The patient is aware the risks include but are not limited to: bleeding, infection, thrombosis of the cannulated access, and possible anaphylactic reaction to the contrast.  The patient acknowledges if the access can not be salvaged a tunneled catheter will be needed and will be placed during this procedure.  The patient is aware of the risks of the procedure and elects to proceed with the angiogram and intervention.  DESCRIPTION: After full informed written consent was obtained, the patient was brought back to the Special Procedure suite and placed supine position.  Appropriate cardiopulmonary monitors were placed.  The left arm was prepped and draped in the standard  fashion.  Appropriate timeout is called. The left brachiocephalic fistula was cannulated with a micropuncture needle.  Cannulation was performed with ultrasound guidance. Ultrasound was placed in a sterile sleeve, the AV access was interrogated and noted to be echolucent and compressible indicating patency. Image was recorded for the permanent record. The puncture is performed under continuous ultrasound visualization.   The microwire was advanced and the needle was exchanged for  a microsheath.  The J-wire was then advanced and a 6 Fr sheath inserted.  Hand injections were completed to image the access from the arterial anastomosis through the entire access.  The central venous structures were also imaged by hand injections.  These initial images demonstrated to problems.  As identified on the ultrasound a sclerotic valve was located in the more proximal cephalic vein just below the level of the deltoid.  Otherwise the cephalic vein itself appeared to be quite nice and measured 8 mm in diameter throughout.  The other issue was a large tributary which was nearly equal in size to the cephalic vein.  The central venous structures were widely patent.  Based on the images,  4000 units of heparin  was given and a wire was negotiated through the stricture (which appeared to be a sclerotic valve) within the venous portion of the graft.  An 8 mm x 60 mm Lutonix drug-eluting balloon was used.  Inflation was to 8 atm for approximately 1 minute.  The  balloon and wire were removed and a rim catheter and floppy Glidewire were then used to select the tributary that was more distal on the arm several centimeters above the arterial anastomosis of the fistula itself.  As noted above the cephalic vein appeared to be 8 mm in diameter which is of excellent caliber however this particular tributary appeared to be 6 to 7 mm in diameter and therefore was siphoning an excessive amount of the flow from the main cephalic vein and likely  contributing to the difficulty with maturation.  Once the rim catheter had been engaged hand-injection contrast verified its position and then a Progreat catheter was advanced through again hand-injection of contrast through the Progreat verified positioning and a total of 4 coils were deployed the first was a 7 mm x 25 cm Ruby standard coil followed by a 15 cm packing coil then a 30 cm packing coil and then a 15 cm packing coil.  Follow-up imaging demonstrated minimal flow with successful treatment of this tributary.  Rim catheter was removed and hand-injection through the sheath was then performed to reevaluate the angioplasty site.  There appeared to be total resolution of the sclerotic valve however more proximal to this there was clearly an area of spasm.  A total of 200 mcg of nitroglycerin  were administered and follow-up imaging demonstrated minimal improvement.  Therefore a 7 mm x 40 mm balloon was advanced across this area of spasm and gently inflated to 4 to 6 atm.  Follow-up imaging demonstrated complete resolution of the spasm with wide patency of the cephalic vein.  Central venous flow was preserved.  A 4-0 Monocryl purse-string suture was sewn around the sheath.  The sheath was removed and light pressure was applied.  A sterile bandage was applied to the puncture site.    COMPLICATIONS: None  CONDITION: Metta Cordella JUDITHANN Jama, M.D [facility] Office: [phone]  01/20/2024 3:57 PM

## 2024-01-20 NOTE — Progress Notes (Signed)
 Dr. Jama at bedside, speaking with pt. And his wife re: procedural results. Both verbalized understanding of conversation with MD.

## 2024-01-20 NOTE — Discharge Instructions (Signed)
 Shuntogram, Care After Refer to this sheet in the next few weeks. These instructions provide you with information on caring for yourself after your procedure. Your health care provider may also give you more specific instructions. Your treatment has been planned according to current medical practices, but problems sometimes occur. Call your health care provider if you have any problems or questions after your procedure. What can I expect after the procedure? After your procedure, it is typical to have the following:  A small amount of discomfort in the area where the catheters were placed.  A small amount of bruising around the fistula.  Sleepiness and fatigue.  Follow these instructions at home:  Rest at home for the day following your procedure.  Do not drive or operate heavy machinery while taking pain medicine.  Take medicines only as directed by your health care provider.  Do not take baths, swim, or use a hot tub until your health care provider approves. You may shower 24 hours after the procedure or as directed by your health care provider.  There are many different ways to close and cover an incision, including stitches, skin glue, and adhesive strips. Follow your health care provider's instructions on: ? Incision care. ? Bandage (dressing) changes and removal. ? Incision closure removal.  Monitor your dialysis fistula carefully. Contact a health care provider if:  You have drainage, redness, swelling, or pain at your catheter site.  You have a fever.  You have chills. Get help right away if:  You feel weak.  You have trouble balancing.  You have trouble moving your arms or legs.  You have problems with your speech or vision.  You can no longer feel a vibration or buzz when you put your fingers over your dialysis fistula.  The limb that was used for the procedure: ? Swells. ? Is painful. ? Is cold. ? Is discolored, such as blue or pale white. This  information is not intended to replace advice given to you by your health care provider. Make sure you discuss any questions you have with your health care provider. Document Released: 07/19/2013 Document Revised: 08/10/2015 Document Reviewed: 04/23/2013 Elsevier Interactive Patient Education  2018 [employer].

## 2024-01-21 ENCOUNTER — Encounter: Payer: Self-pay | Admitting: Vascular Surgery

## 2024-02-04 ENCOUNTER — Other Ambulatory Visit (INDEPENDENT_AMBULATORY_CARE_PROVIDER_SITE_OTHER): Payer: Self-pay | Admitting: Vascular Surgery

## 2024-02-04 DIAGNOSIS — N186 End stage renal disease: Secondary | ICD-10-CM

## 2024-02-05 ENCOUNTER — Encounter (INDEPENDENT_AMBULATORY_CARE_PROVIDER_SITE_OTHER): Payer: Self-pay | Admitting: Vascular Surgery

## 2024-02-05 ENCOUNTER — Other Ambulatory Visit (INDEPENDENT_AMBULATORY_CARE_PROVIDER_SITE_OTHER)

## 2024-02-05 ENCOUNTER — Ambulatory Visit (INDEPENDENT_AMBULATORY_CARE_PROVIDER_SITE_OTHER): Admitting: Vascular Surgery

## 2024-02-05 VITALS — BP 159/64 | HR 72 | Resp 18 | Wt 176.0 lb

## 2024-02-05 DIAGNOSIS — E785 Hyperlipidemia, unspecified: Secondary | ICD-10-CM

## 2024-02-05 DIAGNOSIS — N186 End stage renal disease: Secondary | ICD-10-CM

## 2024-02-05 DIAGNOSIS — I10 Essential (primary) hypertension: Secondary | ICD-10-CM

## 2024-02-09 ENCOUNTER — Encounter (INDEPENDENT_AMBULATORY_CARE_PROVIDER_SITE_OTHER): Payer: Self-pay | Admitting: Vascular Surgery
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
## 2024-03-26 ENCOUNTER — Telehealth (INDEPENDENT_AMBULATORY_CARE_PROVIDER_SITE_OTHER): Payer: Self-pay

## 2024-03-26 NOTE — Telephone Encounter (Signed)
 A fax was received from the dialysis center for the patient to have a permcath removal. I attempted to contact the patient and a message was left for a return call.

## 2024-03-31 ENCOUNTER — Encounter: Payer: Self-pay | Admitting: Neurosurgery

## 2024-04-08 ENCOUNTER — Inpatient Hospital Stay
Admission: RE | Admit: 2024-04-08 | Discharge: 2024-04-08 | Disposition: A | Discharge status: Home / Self Care | Payer: Self-pay | Source: Ambulatory Visit | Attending: Neurosurgery | Admitting: Neurosurgery

## 2024-04-08 ENCOUNTER — Other Ambulatory Visit: Payer: Self-pay

## 2024-04-08 DIAGNOSIS — Z049 Encounter for examination and observation for unspecified reason: Secondary | ICD-10-CM

## 2024-04-08 NOTE — Progress Notes (Signed)
 "   Referring Physician:  Carlisle Benton CROME, FNP [address] [place],  [place] [address]  Primary Physician:  Cleotilde Oneil FALCON, MD  History of Present Illness: 04/13/2024 [name] is here today with a chief complaint of  prior lumbar laminectomy who presents for evaluation of recurrent right-sided lumbar radiculopathy.  He has progressive right-sided low back pain just above the belt line radiating across the right hip and down the right leg to the calf. Pain is provoked by ambulation, bending, and exertion, especially walking more than 10-50 yards, bending over the sink, or exercise. He can no longer walk a mile or perform long runs as before, with marked functional limitation. He denies numbness except in the right foot and left hand.  He underwent lumbar laminectomy about 21 years ago for similar symptoms with 8-10 years of relief. With recurrence, he had additional procedures, physical therapy, and injections. After a stroke affecting the lower right leg 8 years ago he retired from truck driving and his symptoms improved for several years until pain recurred over the past year.  He has persistent numbness in the right foot from the prior stroke and numbness in the left hand from a later small stroke. 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 He had an aortic bifemoral bypass about 8 years ago for peripheral arterial disease with a Dacron graft and is on Eliquis . He had a coronary stent in January 2023 and a prior episode of congestive heart failure that briefly required home oxygen. He smokes about half a pack of cigarettes per day with a 53-year history.   Discussed the use of AI scribe software for clinical note transcription with the patient, who gave verbal consent to proceed.    Duration: issues for 20+ years-recent flare up around May 2025  Bowel/Bladder Dysfunction: none  Conservative measures:  Physical therapy:  has participated in at [facility]- June-August (Notes have been requested) Multimodal medical therapy including regular antiinflammatories:  aspirin , tylenol , hydrocodone   Injections:   03/03/2024: Right L5-S1 and right S1 transforaminal ESI (Celestone 12 mg) 01/08/2024: right piriformis trigger point injection by Dr. Cleotilde  10/21/2023: Right L4-5 and right L5-S1 transforaminal ESI (60% relief)  04/20/2015: Bilateral S1 transforaminal ESI (moderate to good relief) 11/18/2014: Bilateral S1 transforaminal ESI (moderate to good relief) 05/27/2014: Bilateral S1 transforaminal ESI (moderate to good relief) 03/17/2014: Bilateral L4-5 transforaminal ESI (moderate relief times 2-3 months) 10/27/2013: Bilateral L4-5 transforaminal ESI (moderate relief times 3 months) 07/13/2013: Bilateral L4-5 transforaminal ESI  01/27/2013: Bilateral L4-5 transforaminal ESI  08/14/2012: Bilateral L4-5 transforaminal ESI  02/26/2012: Bilateral L4-5 transforaminal ESI  08/07/2011: Bilateral L4-5 transforaminal ESI 10/06/2010: Bilateral L4-5 transforaminal ESI    Past Surgery:  07/19/2003- Bilateral L4-L5 laminotomy, bilateral foraminotomy, decompression of the L4-L5 nerve root, microscope.-Dr Leeann  Neck Surgery in the 1985  [facility]. has no symptoms of cervical myelopathy.  The symptoms are causing a significant impact on the patient's life.   I have utilized the care everywhere function in epic to review the outside records available from external health systems.   Review of Systems:  A 10 point review of systems is negative, except for the pertinent positives and negatives detailed in the HPI.  Past Medical History: Past Medical History:  Diagnosis Date   (HFpEF) heart failure with preserved ejection fraction (HCC)    Aortic atherosclerosis    Arthritis    Bilateral carotid artery stenosis  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    Cerebral microvascular disease    Cholelithiasis    Chronic back pain     Complication of anesthesia 10/2015   a.) postoperative delirium manifested as physical violence and visual hallucinations   COPD (chronic obstructive pulmonary disease) (HCC)    Coronary artery disease involving native coronary artery of native heart without angina pectoris    a.) s/p PCI 04/16/2021: PTCA of RCA with 3.0 x 49 mm Synergy DES x 1 to p-mRCA   CVA (cerebral vascular accident) (HCC) 09/20/2015   a.) brain MRI 09/20/2015: cortically based infarcts in the superior frontal gyri (L>R) suggesting BILATERAL ACA territory ischemia; superimposed acute on chronic ischemia in the LEFT posterior MCA/PCA territory; chronic cerebellar infarcts primarilay on the LEFT   CVA (cerebral vascular accident) (HCC) 02/26/2018   a.) brain MRI 02/26/2018: acute small vessel infarct of the lateral RIGHT thalamus adjacent to the posterior limb of the internal capsule   DCM (dilated cardiomyopathy) (HCC)    DDD (degenerative disc disease), lumbar    a.) s/p BILATERAL L4-L5 laminectomies; (+) BLE weakness s/p surgery per patient report   Duodenal ulcer    ESRD (end stage renal disease) (HCC)    Essential hypertension    Full dentures    GERD (gastroesophageal reflux disease)    Mixed hyperlipidemia    On apixaban  therapy    On long term clopidogrel  therapy    PAD (peripheral artery disease)    a.) s/p BILATERAL femoral endarterectomies and aortobifem bypass 11/08/2015   Polyradiculitis    Pulmonary hypertension (HCC)    Status post bilateral cataract extraction 02/2022    Past Surgical History: Past Surgical History:  Procedure Laterality Date   A/V FISTULAGRAM Left 01/20/2024   Procedure: A/V Fistulagram;  Surgeon: Jama Cordella MATSU, MD;  Location: [facility];  Service: Cardiovascular;  Laterality: Left;   AORTA - BILATERAL FEMORAL ARTERY BYPASS GRAFT N/A 11/08/2015   Procedure: AORTA BIFEMORAL BYPASS GRAFT;  Surgeon: Cordella MATSU Jama, MD;  Location: [facility];  Service: Vascular;   Laterality: N/A;   AV FISTULA PLACEMENT Left 11/14/2023   Procedure: ARTERIOVENOUS (AV) FISTULA CREATION;  Surgeon: Jama Cordella MATSU, MD;  Location: [facility];  Service: Vascular;  Laterality: Left;  BRACHIAL CEPHALIC   BACK SURGERY  07/19/2003   Bilateral L4-L5 laminotomy, bilateral foraminotomy,  decompression of the L4-L5 nerve root, microscope.   CATARACT EXTRACTION W/PHACO Right 02/20/2022   Procedure: CATARACT EXTRACTION PHACO AND INTRAOCULAR LENS PLACEMENT (IOC) RIGHT;  Surgeon: Mittie Gaskin, MD;  Location: [facility];  Service: Ophthalmology;  Laterality: Right;  5.77 0:52.3   CATARACT EXTRACTION W/PHACO Left 03/06/2022   Procedure: CATARACT EXTRACTION PHACO AND INTRAOCULAR LENS PLACEMENT (IOC) LEFT;  Surgeon: Mittie Gaskin, MD;  Location: [facility];  Service: Ophthalmology;  Laterality: Left;  5.77 1:07.5   CERVICAL DISC SURGERY  1987   COLONOSCOPY  12/14/2012   COLONOSCOPY WITH PROPOFOL  N/A 12/30/2017   Procedure: COLONOSCOPY WITH PROPOFOL ;  Surgeon: Toledo, Ladell POUR, MD;  Location: [facility];  Service: Gastroenterology;  Laterality: N/A;   COLONOSCOPY WITH PROPOFOL  N/A 02/02/2021   Procedure: COLONOSCOPY WITH PROPOFOL ;  Surgeon: Maryruth Ole DASEN, MD;  Location: [facility];  Service: Endoscopy;  Laterality: N/A;  Melena   CORONARY ANGIOPLASTY WITH STENT PLACEMENT Left 04/16/2021   Procedure: CORONARY ANGIOPLASTY WITH STENT PLACEMENT; Location: [facility]; Surgeon: Elsie Molt, MD   DIALYSIS/PERMA CATHETER INSERTION N/A 08/05/2023   Procedure: DIALYSIS/PERMA CATHETER INSERTION;  Surgeon: Jama Cordella MATSU, MD;  Location: [facility];  Service: Cardiovascular;  Laterality: N/A;   DIALYSIS/PERMA CATHETER REMOVAL N/A 04/12/2024   Procedure: DIALYSIS/PERMA CATHETER REMOVAL;  Surgeon: Marea Selinda RAMAN, MD;  Location: [facility];  Service: Cardiovascular;  Laterality: N/A;   ENDARTERECTOMY FEMORAL Bilateral 11/08/2015   Procedure:  ENDARTERECTOMY FEMORAL;  Surgeon: Cordella KANDICE Shawl, MD;  Location: [facility];  Service: Vascular   ESOPHAGOGASTRODUODENOSCOPY (EGD) WITH PROPOFOL  N/A 09/04/2015   Procedure: ESOPHAGOGASTRODUODENOSCOPY (EGD) WITH PROPOFOL ;  Surgeon: Rogelia Copping, MD;  Location[facility];  Service: Endoscopy;  Laterality: N/A;   ESOPHAGOGASTRODUODENOSCOPY (EGD) WITH PROPOFOL  N/A 02/02/2021   Procedure: ESOPHAGOGASTRODUODENOSCOPY (EGD) WITH PROPOFOL ;  Surgeon: Maryruth Ole DASEN, MD;  Location: [facility];  Service: Endoscopy;  Laterality: N/A;  Melena   LUMBAR LAMINECTOMY  2006   L4-5   RIGHT/LEFT HEART CATH AND CORONARY ANGIOGRAPHY Bilateral 03/28/2021   Procedure: RIGHT/LEFT HEART CATH AND CORONARY ANGIOGRAPHY;  Surgeon: Florencio Cara BIRCH, MD;  Location: [facility];  Service: Cardiovascular;  Laterality: Bilateral;   SEPTOPLASTY N/A 09/28/2020   Procedure: SEPTOPLASTY;  Surgeon: Edda Mt, MD;  Location: [facility];  Service: ENT;  Laterality: N/A;   TEE WITHOUT CARDIOVERSION N/A 09/22/2015   Procedure: TRANSESOPHAGEAL ECHOCARDIOGRAM (TEE);  Surgeon: Vinie DELENA Jude, MD;  Location: [facility];  Service: Cardiovascular;  Laterality: N/A;   TEMPORARY DIALYSIS CATHETER N/A 07/16/2023   Procedure: TEMPORARY DIALYSIS CATHETER;  Surgeon: Marea Selinda RAMAN, MD;  Location: [facility];  Service: Cardiovascular;  Laterality: N/A;   TURBINATE REDUCTION Bilateral 09/28/2020   Procedure: TURBINATE REDUCTION;  Surgeon: Edda Mt, MD;  Location: [facility];  Service: ENT;  Laterality: Bilateral;    Allergies: Allergies as of 04/13/2024   (No Known Allergies)    Medications: Current Medications[1]  Social History: Social History[2]  Family Medical History: Family History  Problem Relation Age of Onset   Heart attack Mother    Hypertension Mother    Varicose Veins Mother    Diabetes Father    Heart attack Father    Heart attack Maternal Grandmother    Stroke  Maternal Grandmother     Physical Examination: Vitals:   04/13/24 1352  BP: 128/64    General: Patient is in no apparent distress. Attention to examination is appropriate.  Neck:   Supple.  Full range of motion.  Respiratory: Patient is breathing without any difficulty.   NEUROLOGICAL:     Awake, alert, oriented to person, place, and time.  Speech is clear and fluent.   Cranial Nerves: Pupils equal round and reactive to light.  Facial tone is symmetric.  Facial sensation is symmetric. Shoulder shrug is symmetric. Tongue protrusion is midline.  There is no pronator drift.  Strength: Side Biceps Triceps Deltoid Interossei Grip Wrist Ext. Wrist Flex.  R 5 5 5 5 5 5 5   L 5 5 5 5 5 5 5    Side Iliopsoas Quads Hamstring PF DF EHL  R 5 5 5 5 5 5   L 5 5 5 5 5 5    Reflexes are 1+ and symmetric at the biceps, triceps, brachioradialis, patella and achilles.   Hoffman's is absent.   Bilateral upper and lower extremity sensation is intact to light touch.    No evidence of dysmetria noted.  Gait is normal.     Medical Decision Making  Imaging: MRI L spine 10/09/2023  L4-5: Status post bilateral laminectomy as seen on the prior MRI. A disc bulge eccentric to the  right is again seen and causes narrowing in the lateral recesses, worse on the right. Right lateral recess narrowing appears worse than on the prior MRI. The foramina are open.   L5-S1: Shallow disc bulge with an annular fissure. There is some facet degenerative change. No stenosis.   IMPRESSION: 1. Status post bilateral laminectomy at L4-5. A disc bulge eccentric to the right causes narrowing in the lateral recesses, worse on the right. Right lateral recess narrowing appears worse than on the prior MRI. 2. No change in lumbar spondylosis elsewhere.     Electronically Signed   By: Debby Prader M.D.   On: 10/09/2023 08:33  I have personally reviewed the images and agree with the above  interpretation.  Assessment and Plan: Mr. Croft is a pleasant 74 y.o. male with lumbar radiculopathy.  He has tried and failed conservative management.  He has an area of lateral recess stenosis on the right side at L4-5.  There is no further role for conservative management.  I recommended right sided L4-5 laminoforaminotomy.   He has an extensive medical history.  He will require extensive clearance and is at heightened risk for postoperative complication.  He expressed understanding of this.  I have advised that he should discontinue smoking.  I discussed the planned procedure at length with the patient, including the risks, benefits, alternatives, and indications. The risks discussed include but are not limited to bleeding, infection, need for reoperation, spinal fluid leak, stroke, vision loss, anesthetic complication, coma, paralysis, and even death. I also described in detail that improvement was not guaranteed.  The patient expressed understanding of these risks, and asked that we proceed with surgery. I described the surgery in layman's terms, and gave ample opportunity for questions, which were answered to the best of my ability.  I spent a total of 30 minutes in this patient's care today. This time was spent reviewing pertinent records including imaging studies, obtaining and confirming history, performing a directed evaluation, formulating and discussing my recommendations, and documenting the visit within the medical record.       Thank you for involving me in the care of this patient.      General Wearing K. Clois MD, [facility] Neurosurgery     [1]  Current Outpatient Medications:    albuterol  (VENTOLIN  HFA) 108 (90 Base) MCG/ACT inhaler, Inhale 2 puffs into the lungs every 6 (six) hours as needed for wheezing or shortness of breath., Disp: 8 g, Rfl: 3   allopurinol  (ZYLOPRIM ) 100 MG tablet, Take 100 mg by mouth in the morning., Disp: , Rfl:    amLODipine  (NORVASC ) 5 MG  tablet, Take 2 tablets (10 mg total) by mouth daily., Disp: 90 tablet, Rfl: 0   apixaban  (ELIQUIS ) 2.5 MG TABS tablet, Take 2.5 mg by mouth 2 (two) times daily., Disp: , Rfl:    Calcium -Magnesium  (CAL-MAG PO), Take 1 tablet by mouth every evening., Disp: , Rfl:    carvedilol  (COREG ) 25 MG tablet, Take 25 mg by mouth in the morning and at bedtime., Disp: , Rfl:    clopidogrel  (PLAVIX ) 75 MG tablet, Take 75 mg by mouth every evening., Disp: , Rfl:    Coenzyme Q10 (COQ10) 100 MG CAPS, Take 100 mg by mouth every evening., Disp: , Rfl:    Cyanocobalamin  (VITAMIN B-12 PO), Take 1 tablet by mouth in the morning., Disp: , Rfl:    ferrous sulfate 325 (65 FE) MG tablet, Take 325 mg by mouth in the morning., Disp: , Rfl:  furosemide  (LASIX ) 40 MG tablet, Take 80 mg by mouth in the morning and at bedtime., Disp: , Rfl:    hydrALAZINE  (APRESOLINE ) 100 MG tablet, Take 0.5 tablets (50 mg total) by mouth 3 (three) times daily., Disp: 135 tablet, Rfl: 0   HYDROcodone -acetaminophen  (NORCO/VICODIN) 5-325 MG tablet, Take 1 tablet by mouth every 6 (six) hours as needed for moderate pain (pain score 4-6) or severe pain (pain score 7-10) (take one pill for moderate pain and two pills for severe pain)., Disp: 30 tablet, Rfl: 0   isosorbide  mononitrate (IMDUR ) 60 MG 24 hr tablet, Take 1 tablet (60 mg total) by mouth daily., Disp: 90 tablet, Rfl: 0   Multiple Vitamin (MULTIVITAMIN WITH MINERALS) TABS tablet, Take 1 tablet by mouth in the morning., Disp: , Rfl:    Multiple Vitamins-Minerals (PRESERVISION AREDS 2) CAPS, Take 1 capsule by mouth in the morning and at bedtime., Disp: , Rfl:    multivitamin (RENA-VIT) TABS tablet, Take 1 tablet by mouth daily., Disp: , Rfl:    nitroGLYCERIN  (NITROSTAT ) 0.4 MG SL tablet, Place 1 tablet (0.4 mg total) under the tongue every 5 (five) minutes x 3 doses as needed for chest pain., Disp: 30 tablet, Rfl: 12   Omega-3 Fatty Acids (FISH OIL) 1000 MG CAPS, Take 1,000 mg by mouth every  evening., Disp: , Rfl:    pantoprazole  (PROTONIX ) 40 MG tablet, Take 40 mg by mouth every evening., Disp: , Rfl:    potassium chloride  SA (KLOR-CON  M) 20 MEQ tablet, Take 20 mEq by mouth 2 (two) times daily., Disp: , Rfl:    rosuvastatin  (CRESTOR ) 40 MG tablet, Take 40 mg by mouth every evening., Disp: , Rfl:    spironolactone  (ALDACTONE ) 25 MG tablet, Take 25 mg by mouth in the morning., Disp: , Rfl:    Vitamin D -Vitamin K (D3 + K2 PO), Take 1 tablet by mouth every evening., Disp: , Rfl:  [2]  Social History Tobacco Use   Smoking status: Every Day    Current packs/day: 0.50    Average packs/day: 0.5 packs/day for 54.1 years (27.0 ttl pk-yrs)    Types: Cigarettes    Start date: 89   Smokeless tobacco: Never   Tobacco comments:    05/17/21- currently 0.5 PPD  Vaping Use   Vaping status: Never Used  Substance Use Topics   Alcohol use: Not Currently    Comment: rare 3 a year   Drug use: No   "

## 2024-04-12 ENCOUNTER — Encounter: Payer: Self-pay | Admitting: Vascular Surgery

## 2024-04-12 ENCOUNTER — Ambulatory Visit
Admission: RE | Admit: 2024-04-12 | Discharge: 2024-04-12 | Disposition: A | Discharge status: Home / Self Care | Attending: Vascular Surgery | Admitting: Vascular Surgery

## 2024-04-12 ENCOUNTER — Encounter
Admission: RE | Disposition: A | Discharge status: Home / Self Care | Payer: Self-pay | Source: Home / Self Care | Attending: Vascular Surgery

## 2024-04-12 DIAGNOSIS — I5032 Chronic diastolic (congestive) heart failure: Secondary | ICD-10-CM | POA: Diagnosis not present

## 2024-04-12 DIAGNOSIS — F17210 Nicotine dependence, cigarettes, uncomplicated: Secondary | ICD-10-CM | POA: Insufficient documentation

## 2024-04-12 DIAGNOSIS — Z452 Encounter for adjustment and management of vascular access device: Secondary | ICD-10-CM

## 2024-04-12 DIAGNOSIS — Z4901 Encounter for fitting and adjustment of extracorporeal dialysis catheter: Secondary | ICD-10-CM | POA: Diagnosis present

## 2024-04-12 DIAGNOSIS — I132 Hypertensive heart and chronic kidney disease with heart failure and with stage 5 chronic kidney disease, or end stage renal disease: Secondary | ICD-10-CM | POA: Insufficient documentation

## 2024-04-12 DIAGNOSIS — Z992 Dependence on renal dialysis: Secondary | ICD-10-CM

## 2024-04-12 DIAGNOSIS — I2510 Atherosclerotic heart disease of native coronary artery without angina pectoris: Secondary | ICD-10-CM | POA: Diagnosis not present

## 2024-04-12 DIAGNOSIS — N186 End stage renal disease: Secondary | ICD-10-CM | POA: Diagnosis not present

## 2024-04-12 MED ORDER — LIDOCAINE-EPINEPHRINE (PF) 1 %-1:200000 IJ SOLN
INTRAMUSCULAR | Status: DC | PRN
  Administered 2024-04-12: 10 mL

## 2024-04-12 NOTE — Op Note (Signed)
"                                           Operative Note     Preoperative diagnosis:   1. ESRD with functional permanent access  Postoperative diagnosis:  1. ESRD with functional permanent access  Procedure:  Removal of right jugular Permcath  Surgeon:  Selinda Gu, MD  Anesthesia:  Local  EBL:  Minimal  Indication for the Procedure:  The patient has a functional permanent dialysis access and no longer needs their permcath.  This can be removed.  Risks and benefits are discussed and informed consent is obtained.  Description of the Procedure:  [name] right neck, chest and existing catheter were sterilely prepped and draped. The area around the catheter was anesthetized copiously with 1% lidocaine . The catheter was dissected out with curved hemostats until the cuff was freed from the surrounding fibrous sheath. The fiber sheath was transected, and the catheter was then removed in its entirety using gentle traction. Pressure was held and sterile dressings were placed. The patient tolerated the procedure well and was taken to the recovery room in stable condition.     Selinda Gu  04/12/2024, 2:01 PM This note was created with Dragon Medical transcription system. Any errors in dictation are purely unintentional. "
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
## 2024-04-13 ENCOUNTER — Ambulatory Visit: Admitting: Neurosurgery

## 2024-04-13 ENCOUNTER — Telehealth: Payer: Self-pay

## 2024-04-13 ENCOUNTER — Other Ambulatory Visit: Payer: Self-pay

## 2024-04-13 ENCOUNTER — Encounter: Payer: Self-pay | Admitting: Neurosurgery

## 2024-04-13 VITALS — BP 128/64 | Ht 73.0 in | Wt 180.4 lb

## 2024-04-13 DIAGNOSIS — Z01818 Encounter for other preprocedural examination: Secondary | ICD-10-CM

## 2024-04-13 DIAGNOSIS — M48061 Spinal stenosis, lumbar region without neurogenic claudication: Secondary | ICD-10-CM | POA: Diagnosis not present

## 2024-04-13 DIAGNOSIS — M5416 Radiculopathy, lumbar region: Secondary | ICD-10-CM | POA: Diagnosis not present

## 2024-04-13 NOTE — Patient Instructions (Signed)
 " Please see below for information in regards to your upcoming surgery:   Planned surgery: Right L4-5 Laminoforaminotomy   Surgery date: 05/10/24 at [facility] (Medical Mall: [address] [place], [place] [address]) - you will find out your arrival time the business day before your surgery.   Pre-op  appointment at [facility] Pre-admit Testing: you will receive a call with a date/time for this appointment. If you are scheduled for an in person appointment, Pre-admit Testing is located on the first floor of the [facility] building, [address], [address]. During this appointment, they will advise you which medications you can take the morning of surgery, and which medications you will need to hold for surgery. Labs (such as blood work, EKG) may be done at your pre-op  appointment. You are not required to fast for these labs. Should you need to change your pre-op  appointment, please call Pre-admit testing at [phone].   Please bring your medication bottles or an up to date medication list to your pre-admit testing appointment (regardless of whether we have a list in your chart).    Blood thinners:   Eliquis  (apixaban ): stop Eliquis  3 days prior, resume Eliquis  14 days after    Surgical clearance: we will send a clearance form to Oneil Pinal, MD, Vascular Surgery, [facility]. They may wish to see you in their office prior to signing the clearance form. If so, they may call you to schedule an appointment.      Common restrictions after spine surgery: No bending, lifting, or twisting (BLT). Avoid lifting objects heavier than 10 pounds for the first 6 weeks after surgery. Where possible, avoid household activities that involve lifting, bending, reaching, pushing, or pulling such as laundry, vacuuming, grocery shopping, and childcare. Try to arrange for help from friends and family for these activities while you heal. Do not  drive while taking prescription pain medication. Weeks 6 through 12 after surgery: avoid lifting more than 25 pounds.     How to contact us :  If you have any questions/concerns before or after surgery, you can reach us  at [phone], or you can send a mychart message. We can be reached by phone or mychart 8am-4pm, Monday-Friday.  *Please note: Calls after 4pm are forwarded to a third party answering service. Mychart messages are not routinely monitored during evenings, weekends, and holidays. Please call our office to contact the answering service for urgent concerns during non-business hours.     If you have FMLA/disability paperwork, please drop it off or fax it to [phone]   Appointments/FMLA & disability paperwork: Reche Hait, & Nichole Registered Nurse/Surgery scheduler: Kendelyn, RN & Katie, RN Certified Medical Assistants: Don, CMA, Elenor, CMA, Damien, CMA, & Auston, [place] Physician Assistants: Lyle Decamp, PA-C, Edsel Goods, PA-C & Glade Boys, PA-C Surgeons: Penne Sharps, MD & Reeves Daisy, MD    [facility] PREADMIT TESTING VISIT and SURGERY INFORMATION SHEET   Now that surgery has been scheduled you can anticipate several phone calls from [facility] services. A pharmacy technician will call you to verify your current list of medications taken at home.               The Pre-Service Center will call to verify your insurance information and to give you billing estimates and information.             The Preadmit Testing Office will be calling to schedule a visit to obtain information for the  anesthesia team and provide instructions on preparation for surgery.  What can you expect for the Preadmit Testing Visit: Appointments may be scheduled in-person or by telephone.  If a telephone visit is scheduled, you may be asked to come into the office to have lab tests or other studies performed.   This visit will not be completed any  greater than 14 days prior to your surgery.  If your surgery has been scheduled for a future date, please do not be alarmed if we have not contacted you to schedule an appointment more than a month prior to the surgery date.    Please be prepared to provide the following information during this appointment:            -Personal medical history                                               -Medication and allergy list            -Any history of problems with anesthesia              -Recent lab work or diagnostic studies            -Please notify us  of any needs we should be aware of to provide the best care possible           -You will be provided with instructions on how to prepare for your surgery.    On The Day of Surgery:  You must have a driver to take you home after surgery, you will be asked not to drive for 24 hours following surgery.  Taxi, Gisele and non-medical transport will not be acceptable means of transportation unless you have a responsible individual who will be traveling with you.  Visitors in the surgical area:   2 people will be able to visit you in your room once your preparation for surgery has been completed. During surgery, your visitors will be asked to wait in the Surgery Waiting Area.  It is not a requirement for them to stay, if they prefer to leave and come back.  Your visitor(s) will be given an update once the surgery has been completed.  No visitors are allowed in the initial recovery room to respect patient privacy and safety.  Once you are more awake and transfer to the secondary recovery area, or are transferred to an inpatient room, visitors will again be able to see you.  To respect and protect your privacy: We will ask on the day of surgery who your driver will be and what the contact number for that individual will be. We will ask if it is okay to share information with this individual, or if there is an alternative individual that we, or the surgeon,  should contact to provide updates and information. If family or friends come to the surgical information desk requesting information about you, who you have not listed with us , no information will be given.   It may be helpful to designate someone as the main contact who will be responsible for updating your other friends and family.    PREADMIT TESTING OFFICE: [phone] SAME DAY SURGERY: [phone] We look forward to caring for you before and throughout the process of your surgery.  "

## 2024-04-13 NOTE — Telephone Encounter (Signed)
 Called to follow up on PT note request from [facility] in [place]. Patient did not attend PT in [place]. Attempted to call [facility] in [place] without being able to reach anyone at this time. Faxed request to [facility] location.

## 2024-04-14 NOTE — Telephone Encounter (Signed)
 Called [facility] PT in [place]. They said that this patient has not attended PT at any [facility] locations as she would be able to see notes if he had.

## 2024-04-14 NOTE — Telephone Encounter (Signed)
 Received fax from [facility] location. They do not have any record of patient attending PT with them. Called patient and left voicemail requesting that he call his PT office to send us  records of first and last PT note or that he call with an updated location to our office so that we can request records.

## 2024-04-15 NOTE — Telephone Encounter (Signed)
 Called patient to inquire about PT location. Determined that patient likely went to [facility] PT in [place]. [facility] closed at this time. Faxed record request.

## 2024-04-21 NOTE — Telephone Encounter (Signed)
 [facility] PT notes received

## 2024-04-26 ENCOUNTER — Inpatient Hospital Stay: Admission: RE | Admit: 2024-04-26

## 2024-05-10 ENCOUNTER — Ambulatory Visit: Admit: 2024-05-10 | Admitting: Neurosurgery

## 2024-05-24 ENCOUNTER — Encounter: Admitting: Orthopedic Surgery

## 2024-06-22 ENCOUNTER — Encounter: Admitting: Neurosurgery

## 2024-07-29 ENCOUNTER — Encounter: Admitting: Orthopedic Surgery
